# Patient Record
Sex: Male | Born: 1950 | Race: White | Hispanic: No | Marital: Married | Smoking: Former smoker
Health system: Southern US, Community
[De-identification: ages and names within clinical notes are randomized; demographics above are authoritative.]

## PROBLEM LIST (undated history)

## (undated) DIAGNOSIS — I10 Essential (primary) hypertension: Secondary | ICD-10-CM

## (undated) DIAGNOSIS — E785 Hyperlipidemia, unspecified: Secondary | ICD-10-CM

## (undated) DIAGNOSIS — N189 Chronic kidney disease, unspecified: Secondary | ICD-10-CM

## (undated) DIAGNOSIS — M1990 Unspecified osteoarthritis, unspecified site: Secondary | ICD-10-CM

## (undated) DIAGNOSIS — C099 Malignant neoplasm of tonsil, unspecified: Secondary | ICD-10-CM

## (undated) DIAGNOSIS — K8020 Calculus of gallbladder without cholecystitis without obstruction: Secondary | ICD-10-CM

## (undated) DIAGNOSIS — C9000 Multiple myeloma not having achieved remission: Secondary | ICD-10-CM

## (undated) HISTORY — DX: Essential (primary) hypertension: I10

## (undated) HISTORY — DX: Malignant neoplasm of tonsil, unspecified: C09.9

## (undated) HISTORY — DX: Chronic kidney disease, unspecified: N18.9

## (undated) HISTORY — DX: Hyperlipidemia, unspecified: E78.5

## (undated) HISTORY — DX: Calculus of gallbladder without cholecystitis without obstruction: K80.20

---
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
## 1997-12-28 HISTORY — PX: SHOULDER SURGERY: SHX246

## 1998-06-19 ENCOUNTER — Emergency Department (HOSPITAL_COMMUNITY): Admission: EM | Admit: 1998-06-19 | Discharge: 1998-06-19 | Payer: Self-pay | Admitting: Emergency Medicine

## 2003-04-16 HISTORY — PX: OTHER SURGICAL HISTORY: SHX169

## 2004-12-28 DIAGNOSIS — C099 Malignant neoplasm of tonsil, unspecified: Secondary | ICD-10-CM

## 2004-12-28 HISTORY — DX: Malignant neoplasm of tonsil, unspecified: C09.9

## 2008-12-29 ENCOUNTER — Emergency Department: Payer: Self-pay | Admitting: Emergency Medicine

## 2010-06-09 ENCOUNTER — Emergency Department: Payer: Self-pay | Admitting: Emergency Medicine

## 2010-06-22 ENCOUNTER — Emergency Department: Payer: Self-pay | Admitting: Emergency Medicine

## 2010-12-28 HISTORY — PX: KNEE SURGERY: SHX244

## 2013-06-26 ENCOUNTER — Other Ambulatory Visit: Payer: Self-pay | Admitting: Internal Medicine

## 2013-07-06 ENCOUNTER — Encounter: Payer: Self-pay | Admitting: Internal Medicine

## 2013-07-06 ENCOUNTER — Ambulatory Visit (INDEPENDENT_AMBULATORY_CARE_PROVIDER_SITE_OTHER): Admitting: Internal Medicine

## 2013-07-06 VITALS — BP 130/86 | HR 64 | Ht 69.0 in | Wt 217.4 lb

## 2013-07-06 DIAGNOSIS — N182 Chronic kidney disease, stage 2 (mild): Secondary | ICD-10-CM

## 2013-07-06 DIAGNOSIS — C099 Malignant neoplasm of tonsil, unspecified: Secondary | ICD-10-CM

## 2013-07-06 DIAGNOSIS — I499 Cardiac arrhythmia, unspecified: Secondary | ICD-10-CM

## 2013-07-06 DIAGNOSIS — E785 Hyperlipidemia, unspecified: Secondary | ICD-10-CM

## 2013-07-06 DIAGNOSIS — I10 Essential (primary) hypertension: Secondary | ICD-10-CM

## 2013-07-06 DIAGNOSIS — R0989 Other specified symptoms and signs involving the circulatory and respiratory systems: Secondary | ICD-10-CM

## 2013-07-06 NOTE — Patient Instructions (Addendum)
Your physician wants you to follow-up in: 1 year. You will receive a reminder letter in the mail two months in advance. If you don't receive a letter, please call our office to schedule the follow-up appointment.  

## 2013-07-08 ENCOUNTER — Encounter: Payer: Self-pay | Admitting: Internal Medicine

## 2013-07-08 DIAGNOSIS — C099 Malignant neoplasm of tonsil, unspecified: Secondary | ICD-10-CM | POA: Insufficient documentation

## 2013-07-08 DIAGNOSIS — I499 Cardiac arrhythmia, unspecified: Secondary | ICD-10-CM | POA: Insufficient documentation

## 2013-07-08 DIAGNOSIS — N182 Chronic kidney disease, stage 2 (mild): Secondary | ICD-10-CM | POA: Insufficient documentation

## 2013-07-08 DIAGNOSIS — E782 Mixed hyperlipidemia: Secondary | ICD-10-CM | POA: Insufficient documentation

## 2013-07-08 DIAGNOSIS — I10 Essential (primary) hypertension: Secondary | ICD-10-CM | POA: Insufficient documentation

## 2013-07-08 NOTE — Progress Notes (Signed)
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 He has been on Vytorin 10/40 mg. Recently, a lipid NMR demonstrated a high particle number at 1323 with an LDL cholesterol of 67, indicating increased risk. He also underwent a [place] heart protocol per your request, which showed an elevated lipoprotein(a) at 155 and elevated insulin level, suggesting early insulin resistance. Hemoglobin was 5.8. APOB levels were mildly elevated as well as APOA1 fractions. However, overall cholesterol control is fairly good. In the past, I had recommended changing him to Crestor; however, apparently he was intolerant to this in the past as well as Lipitor and another statin, which he does not recall. He is on low-dose Diovan for hypertension and had been on Norvasc in the past. The Diovan was apparently started by Dr. Jacinto Halim prior to my caring for him and I feel that this is a reasonable medicine for blood pressure control. Symptomatically, he denies any chest pain, shortness of breath, palpitations, presyncope, or syncopal symptoms.   PMHx:  Past Medical History  Diagnosis Date  . Hypertension   . Dyslipidemia   . Tonsillar cancer 2006  . Cholelithiases     Past Surgical History  Procedure Laterality Date  . Appendectomy  1962  . Shoulder surgery  1999    left  . Knee surgery  2012    right    FAMHx:  Family History  Problem Relation Age of Onset  . Cancer Mother   . Cancer Father   . Hypertension Sister     SOCHx:   reports that he quit smoking about 9 years ago. His smoking use included Cigars. He quit smokeless tobacco use about 18 years ago. His smokeless tobacco use included Chew. He reports that  drinks alcohol. He reports that he does  not use illicit drugs.  ALLERGIES:  Allergies  Allergen Reactions  . Asa (Aspirin) Nausea Only  . Hydrocodone Itching and Other (See Comments)    Jittery, hyperactivity   . Amoxicillin Itching and Rash  . Indapamide Palpitations    Lightheadedness, dizziness    ROS: A comprehensive review of systems was negative.  HOME MEDS: Current Outpatient Prescriptions  Medication Sig Dispense Refill  . Bisacodyl (DULCOLAX PO) Take 3 capsules by mouth daily.      [name] diltiazem 2 % GEL Apply 1 application topically 3 (three) times daily as needed.      [name] DIOVAN 80 MG tablet TAKE 1 TABLET DAILY  90 tablet  0  . ezetimibe-simvastatin (VYTORIN) 10-40 MG per tablet Take 1 tablet by mouth at bedtime.      . hydrocortisone (PROCTOZONE-HC) 2.5 % rectal cream Place 1 application rectally 3 (three) times daily as needed for hemorrhoids.      . Lidocaine, Anorectal, (RECTICARE) 5 % CREA Apply 1 application topically as needed.      . Multiple Vitamins-Minerals (CENTRUM SILVER ULTRA MENS PO) Take 1 tablet by mouth daily.      . naproxen sodium (ALEVE) 220 MG tablet Take 220 mg by mouth 2 (two) times daily with a meal.      . oxyCODONE-acetaminophen (PERCOCET/ROXICET) 5-325 MG per tablet Take 1 tablet by mouth every 4 (four) hours as needed for pain.      . promethazine (  PHENERGAN) 12.5 MG tablet Take 12.5 mg by mouth every 6 (six) hours as needed for nausea.      . ranitidine (ZANTAC) 150 MG tablet Take 150 mg by mouth daily.       No current facility-administered medications for this visit.    LABS/IMAGING: No results found for this or any previous visit (from the past 48 hour(s)). No results found.  VITALS: BP 130/86  Pulse 64  Ht 5\' 9"  (1.753 m)  Wt 217 lb 6.4 oz (98.612 kg)  BMI 32.09 kg/m2  EXAM: General appearance: alert and no distress Neck: no adenopathy, no carotid bruit, no JVD, supple, symmetrical, trachea midline and thyroid not enlarged, symmetric, no tenderness/mass/nodules Lungs:  clear to auscultation bilaterally Heart: regular rate and rhythm, S1, S2 normal, no murmur Abdomen: soft, non-tender; bowel sounds normal; no masses,  no organomegaly Extremities: extremities normal, atraumatic, no cyanosis or edema Pulses: 2+ and symmetric Skin: Skin color, texture, turgor normal. No rashes or lesions Neurologic: Grossly normal  EKG: Normal sinus rhythm  ASSESSMENT: 1. Hypertension 2. Dyslipidemia 3. History of tonsillar cancer 4. Mild chronic kidney disease  PLAN: 1.   Mr. Heider is doing well from a cardiovascular standpoint. His blood pressure is well controlled and he is due for repeat lipid profile next week. I will continue his current medications and we can see him back annually.  Chrystie Nose, MD, [facility] Attending Cardiologist [facility]  Chrishawn Boley,[place] C 07/08/2013, 9:30 AM

## 2013-07-11 ENCOUNTER — Encounter: Payer: Self-pay | Admitting: Internal Medicine

## 2013-08-18 ENCOUNTER — Ambulatory Visit (INDEPENDENT_AMBULATORY_CARE_PROVIDER_SITE_OTHER): Admitting: General Surgery

## 2013-11-02 ENCOUNTER — Other Ambulatory Visit: Payer: Self-pay

## 2014-03-16 ENCOUNTER — Other Ambulatory Visit (HOSPITAL_COMMUNITY): Payer: Self-pay | Admitting: Internal Medicine

## 2014-03-16 DIAGNOSIS — R748 Abnormal levels of other serum enzymes: Secondary | ICD-10-CM

## 2014-03-27 ENCOUNTER — Encounter (HOSPITAL_COMMUNITY)
Admission: RE | Admit: 2014-03-27 | Discharge: 2014-03-27 | Disposition: A | Discharge status: Home / Self Care | Source: Ambulatory Visit | Attending: Internal Medicine | Admitting: Internal Medicine

## 2014-03-27 DIAGNOSIS — C760 Malignant neoplasm of head, face and neck: Secondary | ICD-10-CM | POA: Insufficient documentation

## 2014-03-27 DIAGNOSIS — R748 Abnormal levels of other serum enzymes: Secondary | ICD-10-CM

## 2014-03-27 MED ORDER — TECHNETIUM TC 99M MEDRONATE IV KIT
24.8000 | PACK | Freq: Once | INTRAVENOUS | Status: AC | PRN
  Administered 2014-03-27: 24.8 via INTRAVENOUS

## 2014-09-07 DIAGNOSIS — I10 Essential (primary) hypertension: Secondary | ICD-10-CM | POA: Diagnosis not present

## 2014-09-07 DIAGNOSIS — E782 Mixed hyperlipidemia: Secondary | ICD-10-CM | POA: Diagnosis not present

## 2014-09-14 DIAGNOSIS — R39198 Other difficulties with micturition: Secondary | ICD-10-CM | POA: Diagnosis not present

## 2014-09-14 DIAGNOSIS — N183 Chronic kidney disease, stage 3 unspecified: Secondary | ICD-10-CM | POA: Diagnosis not present

## 2014-09-14 DIAGNOSIS — M549 Dorsalgia, unspecified: Secondary | ICD-10-CM | POA: Diagnosis not present

## 2014-09-14 DIAGNOSIS — Z23 Encounter for immunization: Secondary | ICD-10-CM | POA: Diagnosis not present

## 2014-09-14 DIAGNOSIS — E782 Mixed hyperlipidemia: Secondary | ICD-10-CM | POA: Diagnosis not present

## 2014-09-14 DIAGNOSIS — M25559 Pain in unspecified hip: Secondary | ICD-10-CM | POA: Diagnosis not present

## 2014-09-14 DIAGNOSIS — I10 Essential (primary) hypertension: Secondary | ICD-10-CM | POA: Diagnosis not present

## 2014-09-27 DIAGNOSIS — R0602 Shortness of breath: Secondary | ICD-10-CM | POA: Diagnosis not present

## 2014-09-27 DIAGNOSIS — R072 Precordial pain: Secondary | ICD-10-CM | POA: Diagnosis not present

## 2014-09-28 ENCOUNTER — Other Ambulatory Visit (HOSPITAL_COMMUNITY): Payer: Self-pay | Admitting: Internal Medicine

## 2014-09-28 DIAGNOSIS — R072 Precordial pain: Secondary | ICD-10-CM

## 2014-10-11 ENCOUNTER — Encounter (HOSPITAL_COMMUNITY)

## 2014-10-11 ENCOUNTER — Telehealth (HOSPITAL_COMMUNITY): Payer: Self-pay

## 2014-10-11 NOTE — Telephone Encounter (Signed)
Encounter complete. 

## 2014-10-12 ENCOUNTER — Telehealth (HOSPITAL_COMMUNITY): Payer: Self-pay

## 2014-10-12 DIAGNOSIS — E782 Mixed hyperlipidemia: Secondary | ICD-10-CM | POA: Diagnosis not present

## 2014-10-12 DIAGNOSIS — M25559 Pain in unspecified hip: Secondary | ICD-10-CM | POA: Diagnosis not present

## 2014-10-12 NOTE — Telephone Encounter (Signed)
Encounter complete. 

## 2014-10-16 ENCOUNTER — Ambulatory Visit (HOSPITAL_COMMUNITY)
Admission: RE | Admit: 2014-10-16 | Discharge: 2014-10-16 | Disposition: A | Discharge status: Home / Self Care | Payer: Medicare Other | Source: Ambulatory Visit | Attending: Cardiovascular Disease | Admitting: Cardiovascular Disease

## 2014-10-16 DIAGNOSIS — R079 Chest pain, unspecified: Secondary | ICD-10-CM | POA: Diagnosis not present

## 2014-10-16 DIAGNOSIS — R072 Precordial pain: Secondary | ICD-10-CM

## 2014-10-16 NOTE — Procedures (Signed)
Exercise Treadmill Test   Test  Exercise Tolerance Test Ordering MD:  [name], MD    Unique Test No: 1  Treadmill:  1  Indication for ETT: chest pain - rule out ischemia  Contraindication to ETT: No   Stress Modality: exercise - treadmill  Cardiac Imaging Performed: non   Protocol: standard Bruce - maximal  Max BP:  150/69  Max MPHR (bpm):  155 85% MPR (bpm):  132  MPHR obtained (bpm):  136 % MPHR obtained:  87  Reached 85% MPHR (min:sec):  6: 30 Total Exercise Time:  7  Workload in METS:  8.5 Borg Scale: 15  Reason ETT Terminated:  DOE and Left Hip Pain    ST Segment Analysis At Rest: NSR with nonspecific ST changes. With Exercise: no evidence of significant ST depression  Other Information Arrhythmia:  Rare PVC. Angina during ETT:  absent (0) Quality of ETT:  diagnostic  ETT Interpretation:  normal - no evidence of ischemia by ST analysis  Comments: ETT with fair exercise tolerance; normal BP response; no chest pain; no ST changes; negative adequate ETT.  Kirk Ruths

## 2014-10-18 DIAGNOSIS — E782 Mixed hyperlipidemia: Secondary | ICD-10-CM | POA: Diagnosis not present

## 2014-10-18 DIAGNOSIS — R072 Precordial pain: Secondary | ICD-10-CM | POA: Diagnosis not present

## 2014-10-18 DIAGNOSIS — I10 Essential (primary) hypertension: Secondary | ICD-10-CM | POA: Diagnosis not present

## 2014-10-18 DIAGNOSIS — R0602 Shortness of breath: Secondary | ICD-10-CM | POA: Diagnosis not present

## 2015-01-31 DIAGNOSIS — I10 Essential (primary) hypertension: Secondary | ICD-10-CM | POA: Diagnosis not present

## 2015-01-31 DIAGNOSIS — E782 Mixed hyperlipidemia: Secondary | ICD-10-CM | POA: Diagnosis not present

## 2015-01-31 DIAGNOSIS — N183 Chronic kidney disease, stage 3 (moderate): Secondary | ICD-10-CM | POA: Diagnosis not present

## 2015-01-31 DIAGNOSIS — R81 Glycosuria: Secondary | ICD-10-CM | POA: Diagnosis not present

## 2015-01-31 DIAGNOSIS — R809 Proteinuria, unspecified: Secondary | ICD-10-CM | POA: Diagnosis not present

## 2015-01-31 DIAGNOSIS — E781 Pure hyperglyceridemia: Secondary | ICD-10-CM | POA: Diagnosis not present

## 2015-02-07 DIAGNOSIS — R809 Proteinuria, unspecified: Secondary | ICD-10-CM | POA: Diagnosis not present

## 2015-02-07 DIAGNOSIS — I10 Essential (primary) hypertension: Secondary | ICD-10-CM | POA: Diagnosis not present

## 2015-02-07 DIAGNOSIS — E782 Mixed hyperlipidemia: Secondary | ICD-10-CM | POA: Diagnosis not present

## 2015-02-07 DIAGNOSIS — N183 Chronic kidney disease, stage 3 (moderate): Secondary | ICD-10-CM | POA: Diagnosis not present

## 2015-02-15 DIAGNOSIS — N200 Calculus of kidney: Secondary | ICD-10-CM | POA: Diagnosis not present

## 2015-02-19 ENCOUNTER — Telehealth: Payer: Self-pay | Admitting: Hematology

## 2015-02-19 NOTE — Telephone Encounter (Signed)
pt confirmed appt for 03/15/15 at 2:30pm w/ [facility] Dx: abnormal protien in blood and M spike Referring Dr. Christie Beckers

## 2015-02-21 DIAGNOSIS — M8580 Other specified disorders of bone density and structure, unspecified site: Secondary | ICD-10-CM | POA: Diagnosis not present

## 2015-02-27 DIAGNOSIS — I10 Essential (primary) hypertension: Secondary | ICD-10-CM | POA: Diagnosis not present

## 2015-02-27 DIAGNOSIS — N183 Chronic kidney disease, stage 3 (moderate): Secondary | ICD-10-CM | POA: Diagnosis not present

## 2015-02-27 DIAGNOSIS — E8809 Other disorders of plasma-protein metabolism, not elsewhere classified: Secondary | ICD-10-CM | POA: Diagnosis not present

## 2015-03-15 ENCOUNTER — Encounter: Payer: Self-pay | Admitting: Hematology

## 2015-03-15 ENCOUNTER — Telehealth: Payer: Self-pay | Admitting: Hematology

## 2015-03-15 ENCOUNTER — Ambulatory Visit (HOSPITAL_BASED_OUTPATIENT_CLINIC_OR_DEPARTMENT_OTHER): Payer: Medicare Other

## 2015-03-15 ENCOUNTER — Ambulatory Visit (HOSPITAL_BASED_OUTPATIENT_CLINIC_OR_DEPARTMENT_OTHER): Payer: Medicare Other | Admitting: Hematology

## 2015-03-15 ENCOUNTER — Ambulatory Visit: Payer: Medicare Other

## 2015-03-15 VITALS — BP 145/70 | HR 61 | Temp 97.3°F | Resp 18 | Ht 69.0 in | Wt 206.7 lb

## 2015-03-15 DIAGNOSIS — Z8589 Personal history of malignant neoplasm of other organs and systems: Secondary | ICD-10-CM | POA: Diagnosis not present

## 2015-03-15 DIAGNOSIS — M8580 Other specified disorders of bone density and structure, unspecified site: Secondary | ICD-10-CM

## 2015-03-15 DIAGNOSIS — D472 Monoclonal gammopathy: Secondary | ICD-10-CM | POA: Diagnosis not present

## 2015-03-15 LAB — CBC & DIFF AND RETIC
BASO%: 0.2 % (ref 0.0–2.0)
BASOS ABS: 0 10*3/uL (ref 0.0–0.1)
EOS%: 0.7 % (ref 0.0–7.0)
Eosinophils Absolute: 0 10*3/uL (ref 0.0–0.5)
HCT: 41.7 % (ref 38.4–49.9)
HEMOGLOBIN: 14.6 g/dL (ref 13.0–17.1)
Immature Retic Fract: 6.9 % (ref 3.00–10.60)
LYMPH%: 24 % (ref 14.0–49.0)
MCH: 33.8 pg — AB (ref 27.2–33.4)
MCHC: 35 g/dL (ref 32.0–36.0)
MCV: 96.5 fL (ref 79.3–98.0)
MONO#: 0.7 10*3/uL (ref 0.1–0.9)
MONO%: 11 % (ref 0.0–14.0)
NEUT#: 3.8 10*3/uL (ref 1.5–6.5)
NEUT%: 64.1 % (ref 39.0–75.0)
PLATELETS: 264 10*3/uL (ref 140–400)
RBC: 4.32 10*6/uL (ref 4.20–5.82)
RDW: 13.4 % (ref 11.0–14.6)
RETIC %: 1.54 % (ref 0.80–1.80)
Retic Ct Abs: 66.53 10*3/uL (ref 34.80–93.90)
WBC: 5.9 10*3/uL (ref 4.0–10.3)
lymph#: 1.4 10*3/uL (ref 0.9–3.3)

## 2015-03-15 LAB — COMPREHENSIVE METABOLIC PANEL (CC13)
ALT: 43 U/L (ref 0–55)
ANION GAP: 7 meq/L (ref 3–11)
AST: 31 U/L (ref 5–34)
Albumin: 4.4 g/dL (ref 3.5–5.0)
Alkaline Phosphatase: 196 U/L — ABNORMAL HIGH (ref 40–150)
BUN: 8.5 mg/dL (ref 7.0–26.0)
CALCIUM: 9.5 mg/dL (ref 8.4–10.4)
CHLORIDE: 109 meq/L (ref 98–109)
CO2: 19 meq/L — AB (ref 22–29)
Creatinine: 1.5 mg/dL — ABNORMAL HIGH (ref 0.7–1.3)
EGFR: 50 mL/min/{1.73_m2} — AB (ref >90)
GLUCOSE: 96 mg/dL (ref 70–140)
Potassium: 3.4 mEq/L — ABNORMAL LOW (ref 3.5–5.1)
SODIUM: 135 meq/L — AB (ref 136–145)
TOTAL PROTEIN: 8.7 g/dL — AB (ref 6.4–8.3)
Total Bilirubin: 0.5 mg/dL (ref 0.20–1.20)

## 2015-03-15 LAB — LACTATE DEHYDROGENASE (CC13): LDH: 164 U/L (ref 125–245)

## 2015-03-15 NOTE — Telephone Encounter (Signed)
Gave avs & calendar for APril. Sent for labs

## 2015-03-15 NOTE — Progress Notes (Signed)
Checked in new pt with no financial concerns. °

## 2015-03-15 NOTE — Addendum Note (Signed)
Addended by: Truitt Merle on: 03/15/2015 04:00 PM   Modules accepted: Orders, SmartSet

## 2015-03-15 NOTE — Progress Notes (Signed)
[place]  [phone] [phone]) [place] Note   Patient Care Team: [name], MD as PCP - General (Internal Medicine) 03/15/2015  CHIEF COMPLAINTS/PURPOSE OF CONSULTATION: Back pain, abnormal SPEP  Referring physician: [name], MD  HISTORY OF PRESENTING ILLNESS:  Eric Dudley 64 y.o. 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  He has been having low back pain for 3-4 months. He had food posinin 4 month ago, and had frequent diarrhea. He started noticed low back pain since then. The pain is not radiating to leg, he also has intermittent right rib pain, worse with cough and sneezing. He remains to be physically active, able to do all the activities, such as gardening work housework without much limitation, but he doesn't sings slowly because of back pain. He takes Tylenol as needed, does not like the necrotic pain medication. No night sweats, no fever or chillss, no weight loss.   He was seen by his primary care physician Dr. [name], MD and had lab test (see below). He also had bone density scan 2/25 which showed osteoporosis, has not been treated yet.  MEDICAL HISTORY:  Past Medical History  Diagnosis Date  . Hypertension   . Dyslipidemia   . Tonsillar cancer 2006  . Cholelithiases     SURGICAL HISTORY: Past Surgical History  Procedure Laterality Date  . Appendectomy  1962  . Shoulder surgery  1999    left  . Knee surgery  2012    right    SOCIAL HISTORY: History   Social History  . Marital Status: Married    Spouse Name: N/A  . Number of Children: 1   . Years of Education: N/A   Occupational History  . A retired Therapist, occupational    Social History Main Topics  . Smoking status: Former Smoker    Types: Cigars    Quit date: 12/29/2003  . Smokeless tobacco:  Former Systems developer    Types: Chew    Quit date: 12/28/1994  . Alcohol Use: Yes     Comment: occasional beer  . Drug Use: No  . Sexual Activity: Not on file   Other Topics Concern  . Not on file   Social History Narrative  . No narrative on file    FAMILY HISTORY: Family History  Problem Relation Age of Onset  . Cancer Mother   . Cancer Father   . Hypertension Sister     ALLERGIES:  is allergic to asa; hydrocodone; amoxicillin; and indapamide.  MEDICATIONS:  Current Outpatient Prescriptions  Medication Sig Dispense Refill  . acetaminophen (TYLENOL) 325 MG tablet Take 650 mg by mouth every 6 (six) hours as needed.    . Bisacodyl (DULCOLAX PO) Take 3 capsules by mouth daily.    [name] DIOVAN 80 MG tablet TAKE 1 TABLET DAILY 90 tablet 0  . ezetimibe-simvastatin (VYTORIN) 10-40 MG per tablet Take 1 tablet by mouth at bedtime.    . Multiple Vitamins-Minerals (CENTRUM SILVER ULTRA MENS PO) Take 1 tablet by mouth daily.    . psyllium (METAMUCIL) 58.6 % powder Take 1 packet by mouth every other day.    . ranitidine (ZANTAC) 150 MG tablet Take 150 mg by mouth daily.    [name] diltiazem 2 % GEL Apply 1 application topically 3 (three) times daily as needed.    . hydrocortisone (PROCTOZONE-HC) 2.5 % rectal  cream Place 1 application rectally 3 (three) times daily as needed for hemorrhoids.    [name] oxyCODONE-acetaminophen (PERCOCET/ROXICET) 5-325 MG per tablet Take 1 tablet by mouth every 4 (four) hours as needed for pain.    . promethazine (PHENERGAN) 12.5 MG tablet Take 12.5 mg by mouth every 6 (six) hours as needed for nausea.     No current facility-administered medications for this visit.    REVIEW OF SYSTEMS:   Constitutional: Denies fevers, chills or abnormal night sweats Eyes: Denies blurriness of vision, double vision or watery eyes Ears, nose, mouth, throat, and face: Denies mucositis or sore throat Respiratory: Denies cough, dyspnea or wheezes Cardiovascular: Denies palpitation, chest  discomfort or lower extremity swelling Gastrointestinal:  Denies nausea, heartburn or change in bowel habits Skin: Denies abnormal skin rashes Lymphatics: Denies new lymphadenopathy or easy bruising Neurological:Denies numbness, tingling or new weaknesses Behavioral/Psych: Mood is stable, no new changes  Musculoskeletal: Positive for back pain and intermittent rib pain All other systems were reviewed with the patient and are negative.  PHYSICAL EXAMINATION: ECOG PERFORMANCE STATUS: 1 - Symptomatic but completely ambulatory  Filed Vitals:   03/15/15 1412  BP: 145/70  Pulse: 61  Temp: 97.3 F (36.3 C)  Resp: 18   Filed Weights   03/15/15 1412  Weight: 206 lb 11.2 oz (93.759 kg)    GENERAL:alert, no distress and comfortable SKIN: skin color, texture, turgor are normal, no rashes or significant lesions EYES: normal, conjunctiva are pink and non-injected, sclera clear OROPHARYNX:no exudate, no erythema and lips, buccal mucosa, and tongue normal  NECK: supple, thyroid normal size, non-tender, without nodularity LYMPH:  no palpable lymphadenopathy in the cervical, axillary or inguinal LUNGS: clear to auscultation and percussion with normal breathing effort HEART: regular rate & rhythm and no murmurs and no lower extremity edema ABDOMEN:abdomen soft, non-tender and normal bowel sounds Musculoskeletal:no cyanosis of digits and no clubbing  PSYCH: alert & oriented x 3 with fluent speech NEURO: no focal motor/sensory deficits  LABORATORY DATA:  I have reviewed the data as listed  RADIOGRAPHIC STUDIES: I have personally reviewed the radiological images as listed and agreed with the findings in the report. Lab from 01/31/2015 CMP: Albumin 5.1, alkaline phosphate 161, ALT 41, AST 28, bilirubin 0.4, BUN 8, calcium 9.7, creatinine 1.47, total protein 8.6, sodium 138, potassium 3.9. HbA1c: 5.9 Protein electrophoresis: Total protein 8.2,, globulin 1.9, M spike 1.7G/DL Random urine  protein electrophoresis: Protein 230MG/DL, M spike 52% CBC on 09/07/2014: WBC 5.9: Hemoglobin 14.8, hematocrit 42.9%, MCV 94.5, platelet count 301k   ASSESSMENT & PLAN:  64 year old Caucasian male with past medical history of hypertension, tonsil cancer status post surgery and radiation 10 years ago, now presented with 3-4 months low back pain and intermittent rib pain. His blood test showed M protein 1.7g/dl.  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 -I'll check his CBC with differential, repeat SPEP with immunofixation and quantitative immunoglobin, light chain levels, 24-hour urine protein electrophoresis with immunofixation and light chain level. -Bone survey -I discussed a bone marrow biopsy to ruled out multiple myeloma. The benefit and risks was Explained to patient he agrees to proceed. I'll set up through interventional radiology. -I briefly discussed the treatment options for multiple myeloma, which include chemotherapy and bone marrow transplant.   2. Osteopenia -We'll likely give him a biphosphonate after the above  on diagnose workup..   3. Hypertension -Follow up with primary care physician.  Follow-up: I'll see him back in 3 weeks to discuss the above results it  All questions were answered. The patient knows to call the clinic with any problems, questions or concerns. I spent 45 minutes counseling the patient face to face. The total time spent in the appointment was 60 minutes and more than 50% was on counseling.     Truitt Merle, MD 03/15/2015 2:56 PM

## 2015-03-18 DIAGNOSIS — D472 Monoclonal gammopathy: Secondary | ICD-10-CM | POA: Diagnosis not present

## 2015-03-19 ENCOUNTER — Ambulatory Visit (HOSPITAL_COMMUNITY)
Admission: RE | Admit: 2015-03-19 | Discharge: 2015-03-19 | Disposition: A | Discharge status: Home / Self Care | Payer: Medicare Other | Source: Ambulatory Visit | Attending: Hematology | Admitting: Hematology

## 2015-03-19 DIAGNOSIS — D472 Monoclonal gammopathy: Secondary | ICD-10-CM | POA: Diagnosis present

## 2015-03-19 LAB — SPEP & IFE WITH QIG
ALPHA-1-GLOBULIN: 0.2 g/dL (ref 0.2–0.3)
Albumin ELP: 4.8 g/dL (ref 3.8–4.8)
Alpha-2-Globulin: 1 g/dL — ABNORMAL HIGH (ref 0.5–0.9)
BETA 2: 0.1 g/dL — AB (ref 0.2–0.5)
Beta Globulin: 0.5 g/dL (ref 0.4–0.6)
Gamma Globulin: 1.9 g/dL — ABNORMAL HIGH (ref 0.8–1.7)
IGA: 12 mg/dL — AB (ref 68–379)
IGG (IMMUNOGLOBIN G), SERUM: 2000 mg/dL — AB (ref 650–1600)
IgM, Serum: 5 mg/dL — ABNORMAL LOW (ref 41–251)
M-Spike, %: 1.6 g/dL
Total Protein, Serum Electrophoresis: 8.5 g/dL — ABNORMAL HIGH (ref 6.1–8.1)

## 2015-03-19 LAB — KAPPA/LAMBDA LIGHT CHAINS
Kappa free light chain: 690 mg/dL — ABNORMAL HIGH (ref 0.33–1.94)
Lambda Free Lght Chn: 0.13 mg/dL — ABNORMAL LOW (ref 0.57–2.63)

## 2015-03-19 LAB — BETA 2 MICROGLOBULIN, SERUM: Beta-2 Microglobulin: 2.68 mg/L — ABNORMAL HIGH (ref <=2.51)

## 2015-03-25 ENCOUNTER — Other Ambulatory Visit: Payer: Self-pay | Admitting: Radiology

## 2015-03-25 LAB — UIFE/LIGHT CHAINS/TP QN, 24-HR UR
ALPHA 1 UR: DETECTED — AB
Albumin, U: DETECTED
Alpha 2, Urine: DETECTED — AB
Beta, Urine: DETECTED — AB
Gamma Globulin, Urine: DETECTED — AB
TIME-UPE24: 24 h
TOTAL PROTEIN, URINE-UPE24: 146 mg/dL — AB (ref 5–25)
Total Protein, Urine-Ur/day: 5402 mg/d — ABNORMAL HIGH (ref <150)
Volume, Urine: 3700 mL

## 2015-03-25 LAB — UPEP/TP, 24-HR URINE
Albumin: 11.2 %
Alpha-1-Globulin, U: 15.7 %
Alpha-2-Globulin, U: 8.5 %
Beta Globulin, U: 10 %
COLLECTION INTERVAL: 24 h
Gamma Globulin, U: 54.6 %
Monoclonal Band 1: 4.3 %
Monoclonal Band 2: 7.5 %
TOTAL PROTEIN, URINE: 146 mg/dL
Total Protein, Urine/Day: 5402 mg/d — ABNORMAL HIGH (ref 50–100)
Total Volume, Urine: 3700 mL

## 2015-03-25 LAB — 24 HR URINE,KAPPA/LAMBDA LIGHT CHAINS
24H Urine Volume: 3700 mL/24 h
MEASURED LAMBDA CHAIN: 1.02 mg/dL (ref <2.00)
Measured Kappa Chain: 65.8 mg/dL — ABNORMAL HIGH (ref <2.00)
TOTAL KAPPA CHAIN: 2434.6 mg/(24.h)
Total Lambda Chain: 37.74 mg/24 h

## 2015-03-26 ENCOUNTER — Other Ambulatory Visit: Payer: Self-pay | Admitting: Radiology

## 2015-03-27 ENCOUNTER — Encounter (HOSPITAL_COMMUNITY): Payer: Self-pay

## 2015-03-27 ENCOUNTER — Ambulatory Visit (HOSPITAL_COMMUNITY)
Admission: RE | Admit: 2015-03-27 | Discharge: 2015-03-27 | Disposition: A | Discharge status: Home / Self Care | Payer: Medicare Other | Source: Ambulatory Visit | Attending: Interventional Radiology | Admitting: Interventional Radiology

## 2015-03-27 ENCOUNTER — Ambulatory Visit (HOSPITAL_COMMUNITY)
Admission: RE | Admit: 2015-03-27 | Discharge: 2015-03-27 | Disposition: A | Discharge status: Home / Self Care | Payer: Medicare Other | Source: Ambulatory Visit | Attending: Hematology | Admitting: Hematology

## 2015-03-27 DIAGNOSIS — Z9049 Acquired absence of other specified parts of digestive tract: Secondary | ICD-10-CM | POA: Insufficient documentation

## 2015-03-27 DIAGNOSIS — Z79899 Other long term (current) drug therapy: Secondary | ICD-10-CM | POA: Insufficient documentation

## 2015-03-27 DIAGNOSIS — D4989 Neoplasm of unspecified behavior of other specified sites: Secondary | ICD-10-CM | POA: Diagnosis not present

## 2015-03-27 DIAGNOSIS — I10 Essential (primary) hypertension: Secondary | ICD-10-CM | POA: Insufficient documentation

## 2015-03-27 DIAGNOSIS — Z87891 Personal history of nicotine dependence: Secondary | ICD-10-CM | POA: Insufficient documentation

## 2015-03-27 DIAGNOSIS — Z8589 Personal history of malignant neoplasm of other organs and systems: Secondary | ICD-10-CM | POA: Insufficient documentation

## 2015-03-27 DIAGNOSIS — D472 Monoclonal gammopathy: Secondary | ICD-10-CM

## 2015-03-27 DIAGNOSIS — C9030 Solitary plasmacytoma not having achieved remission: Secondary | ICD-10-CM | POA: Insufficient documentation

## 2015-03-27 DIAGNOSIS — E785 Hyperlipidemia, unspecified: Secondary | ICD-10-CM | POA: Diagnosis not present

## 2015-03-27 LAB — PROTIME-INR
INR: 0.98 (ref 0.00–1.49)
Prothrombin Time: 13.1 seconds (ref 11.6–15.2)

## 2015-03-27 LAB — CBC
HEMATOCRIT: 43.1 % (ref 39.0–52.0)
Hemoglobin: 14.8 g/dL (ref 13.0–17.0)
MCH: 33.6 pg (ref 26.0–34.0)
MCHC: 34.3 g/dL (ref 30.0–36.0)
MCV: 98 fL (ref 78.0–100.0)
Platelets: 263 10*3/uL (ref 150–400)
RBC: 4.4 MIL/uL (ref 4.22–5.81)
RDW: 13.3 % (ref 11.5–15.5)
WBC: 5.3 10*3/uL (ref 4.0–10.5)

## 2015-03-27 LAB — BONE MARROW EXAM

## 2015-03-27 MED ORDER — MIDAZOLAM HCL 2 MG/2ML IJ SOLN
INTRAMUSCULAR | Status: AC
  Filled 2015-03-27: qty 6

## 2015-03-27 MED ORDER — FENTANYL CITRATE 0.05 MG/ML IJ SOLN
INTRAMUSCULAR | Status: AC | PRN
  Administered 2015-03-27 (×4): 25 ug via INTRAVENOUS

## 2015-03-27 MED ORDER — SODIUM CHLORIDE 0.9 % IV SOLN
INTRAVENOUS | Status: DC
  Administered 2015-03-27: 07:00:00 via INTRAVENOUS

## 2015-03-27 MED ORDER — FENTANYL CITRATE 0.05 MG/ML IJ SOLN
INTRAMUSCULAR | Status: AC
  Filled 2015-03-27: qty 4

## 2015-03-27 MED ORDER — MIDAZOLAM HCL 2 MG/2ML IJ SOLN
INTRAMUSCULAR | Status: AC | PRN
Start: 2015-03-27 — End: 2015-03-27
  Administered 2015-03-27: 1 mg via INTRAVENOUS
  Administered 2015-03-27 (×2): 0.5 mg via INTRAVENOUS

## 2015-03-27 NOTE — Procedures (Signed)
Successful RT ILIAC BM ASP AND ATTEMPTED CORE BX NO COMP STABLE PATH PENDING FULL REPORT IN PACS

## 2015-03-27 NOTE — Discharge Instructions (Signed)
Leave dressing on for 24 hours.  You may shower after 24 hours.  Please remove the dressing before you shower.   ° °Bone Marrow Aspiration, Bone Marrow Biopsy °Care After °Read the instructions outlined below and refer to this sheet in the next few weeks. These discharge instructions provide you with general information on caring for yourself after you leave the hospital. Your caregiver may also give you specific instructions. While your treatment has been planned according to the most current medical practices available, unavoidable complications occasionally occur. If you have any problems or questions after discharge, call your caregiver. °FINDING OUT THE RESULTS OF YOUR TEST °Not all test results are available during your visit. If your test results are not back during the visit, make an appointment with your caregiver to find out the results. Do not assume everything is normal if you have not heard from your caregiver or the medical facility. It is important for you to follow up on all of your test results.  °HOME CARE INSTRUCTIONS  °You have had sedation and may be sleepy or dizzy. Your thinking may not be as clear as usual. For the next 24 hours: °· Only take over-the-counter or prescription medicines for pain, discomfort, and or fever as directed by your caregiver. °· Do not drink alcohol. °· Do not smoke. °· Do not drive. °· Do not make important legal decisions. °· Do not operate heavy machinery. °· Do not care for small children by yourself. °· Keep your dressing clean and dry. You may replace dressing with a bandage after 24 hours. °· You may take a bath or shower after 24 hours. °· Use an ice pack for 20 minutes every 2 hours while awake for pain as needed. °SEEK MEDICAL CARE IF:  °· There is redness, swelling, or increasing pain at the biopsy site. °· There is pus coming from the biopsy site. °· There is drainage from a biopsy site lasting longer than one day. °· An unexplained oral temperature above  102° F (38.9° C) develops. °SEEK IMMEDIATE MEDICAL CARE IF:  °· You develop a rash. °· You have difficulty breathing. °· You develop any reaction or side effects to medications given. °Document Released: 07/03/2005 Document Revised: 03/07/2012 Document Reviewed: 12/11/2008 °ExitCare® Patient Information ©2015 [employer]. This information is not intended to replace advice given to you by your health care provider. Make sure you discuss any questions you have with your health care provider. °Conscious Sedation, Adult, Care After °Refer to this sheet in the next few weeks. These instructions provide you with information on caring for yourself after your procedure. Your health care provider may also give you more specific instructions. Your treatment has been planned according to current medical practices, but problems sometimes occur. Call your health care provider if you have any problems or questions after your procedure. °WHAT TO EXPECT AFTER THE PROCEDURE  °After your procedure: °· You may feel sleepy, clumsy, and have poor balance for several hours. °· Vomiting may occur if you eat too soon after the procedure. °HOME CARE INSTRUCTIONS °· Do not participate in any activities where you could become injured for at least 24 hours. Do not: °· Drive. °· Swim. °· Ride a bicycle. °· Operate heavy machinery. °· Cook. °· Use power tools. °· Climb ladders. °· Work from a high place. °· Do not make important decisions or sign legal documents until you are improved. °· If you vomit, drink water, juice, or soup when you can drink without vomiting.   Make sure you have little or no nausea before eating solid foods. °· Only take over-the-counter or prescription medicines for pain, discomfort, or fever as directed by your health care provider. °· Make sure you and your family fully understand everything about the medicines given to you, including what side effects may occur. °· You should not drink alcohol, take sleeping pills,  or take medicines that cause drowsiness for at least 24 hours. °· If you smoke, do not smoke without supervision. °· If you are feeling better, you may resume normal activities 24 hours after you were sedated. °· Keep all appointments with your health care provider. °SEEK MEDICAL CARE IF: °· Your skin is pale or bluish in color. °· You continue to feel nauseous or vomit. °· Your pain is getting worse and is not helped by medicine. °· You have bleeding or swelling. °· You are still sleepy or feeling clumsy after 24 hours. °SEEK IMMEDIATE MEDICAL CARE IF: °· You develop a rash. °· You have difficulty breathing. °· You develop any type of allergic problem. °· You have a fever. °MAKE SURE YOU: °· Understand these instructions. °· Will watch your condition. °· Will get help right away if you are not doing well or get worse. °Document Released: 10/04/2013 Document Reviewed: 10/04/2013 °ExitCare® Patient Information ©2015 [employer]. This information is not intended to replace advice given to you by your health care provider. Make sure you discuss any questions you have with your health care provider. ° ° °

## 2015-03-27 NOTE — H&P (Signed)
Chief Complaint: "I'm here for a bone marrow biopsy"  HPI: Eric Dudley is an 64 y.o. 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 He is referred for bone marrow biopsy. Has bee NPO this am   Past Medical History:  Past Medical History  Diagnosis Date  . Hypertension   . Dyslipidemia   . Tonsillar cancer 2006  . Cholelithiases     Past Surgical History:  Past Surgical History  Procedure Laterality Date  . Appendectomy  1962  . Shoulder surgery  1999    left  . Knee surgery  2012    right    Family History:  Family History  Problem Relation Age of Onset  . Cancer Mother 10    cervical cancer   . Cancer Father     unknown cancer   . Hypertension Sister   . Cancer Sister     melanoma     Social History:  reports that he quit smoking about 11 years ago. His smoking use included Cigars. He quit smokeless tobacco use about 20 years ago. His smokeless tobacco use included Chew. He reports that he drinks alcohol. He reports that he does not use illicit drugs.  Allergies:  Allergies  Allergen Reactions  . Hydrochlorothiazide Rash and Palpitations  . Asa [Aspirin] Nausea Only  . Hydrocodone Itching and Other (See Comments)    Jittery, hyperactivity   . Amoxicillin Itching and Rash  . Indapamide Palpitations    Lightheadedness, dizziness    Medications:   Medication List    ASK your doctor about these medications        acetaminophen 500 MG tablet  Commonly known as:  TYLENOL  Take 1,000 mg by mouth 3 (three) times daily as needed for mild pain or moderate pain.     CENTRUM SILVER ULTRA MENS PO  Take 1 tablet by mouth daily.     DIOVAN 80 MG tablet  Generic drug:  valsartan  TAKE 1 TABLET DAILY     DULCOLAX PO  Take 3 capsules by mouth daily.     ezetimibe-simvastatin 10-40 MG per tablet  Commonly known as:  VYTORIN  Take 1 tablet by mouth at bedtime.     psyllium 58.6 % powder  Commonly known as:  METAMUCIL  Take 1 packet by mouth every other day.     ranitidine 150 MG tablet  Commonly known as:  ZANTAC  Take 300 mg by mouth at bedtime.     traMADol 50 MG tablet  Commonly known as:  ULTRAM  Take 50-100 mg by mouth 3 (three) times daily as needed for moderate pain.        Please HPI for pertinent positives, otherwise complete 10 system ROS negative.  Physical Exam: BP 122/69 mmHg  Pulse 51  Temp(Src) 98 F (36.7 C) (Oral)  Resp 16  SpO2 99% There is no weight on file to calculate BMI.   General Appearance:  Alert, cooperative, no distress, appears stated age  Head:  Normocephalic, without obvious abnormality, atraumatic  ENT: Unremarkable  Neck: Supple, symmetrical, trachea midline  Lungs:   Clear to auscultation bilaterally, no w/r/r, respirations unlabored without use of accessory muscles.  Chest Wall:  No tenderness or deformity  Heart:  Regular rate and rhythm, S1, S2 normal, no murmur, rub or gallop.  Abdomen:   Soft, non-tender, non distended.  Neurologic: Normal affect, no gross deficits.  Labs: Results for orders placed or performed during the hospital encounter of 03/27/15 (from the past 48 hour(s))  CBC upon arrival     Status: None   Collection Time: 03/27/15  7:20 AM  Result Value Ref Range   WBC 5.3 4.0 - 10.5 K/uL   RBC 4.40 4.22 - 5.81 MIL/uL   Hemoglobin 14.8 13.0 - 17.0 g/dL   HCT 43.1 39.0 - 52.0 %   MCV 98.0 78.0 - 100.0 fL   MCH 33.6 26.0 - 34.0 pg   MCHC 34.3 30.0 - 36.0 g/dL   RDW 13.3 11.5 - 15.5 %   Platelets 263 150 - 400 K/uL  Protime-INR upon arrival     Status: None   Collection Time: 03/27/15  7:20 AM  Result Value Ref Range   Prothrombin Time 13.1 11.6 - 15.2 seconds   INR 0.98 0.00 - 1.49    Imaging: No results found.  Assessment/Plan Monoclonal gammopathy Bone marrow biopsy Risks and Benefits discussed with the patient including, but not limited to bleeding, infection, damage to adjacent structures or low yield requiring additional tests. All of the patient's questions were  answered, patient is agreeable to proceed. Consent signed and in chart.   Ascencion Dike PA-C 03/27/2015, 8:27 AM

## 2015-03-31 ENCOUNTER — Other Ambulatory Visit: Payer: Self-pay | Admitting: Hematology

## 2015-03-31 DIAGNOSIS — C9000 Multiple myeloma not having achieved remission: Secondary | ICD-10-CM

## 2015-04-01 ENCOUNTER — Telehealth: Payer: Self-pay | Admitting: Hematology

## 2015-04-01 NOTE — Telephone Encounter (Signed)
Confirm appointment for 04/05

## 2015-04-02 ENCOUNTER — Other Ambulatory Visit (HOSPITAL_BASED_OUTPATIENT_CLINIC_OR_DEPARTMENT_OTHER): Payer: Medicare Other

## 2015-04-02 ENCOUNTER — Ambulatory Visit (HOSPITAL_BASED_OUTPATIENT_CLINIC_OR_DEPARTMENT_OTHER): Payer: Medicare Other | Admitting: Hematology

## 2015-04-02 ENCOUNTER — Telehealth: Payer: Self-pay | Admitting: *Deleted

## 2015-04-02 ENCOUNTER — Telehealth: Payer: Self-pay | Admitting: Hematology

## 2015-04-02 VITALS — BP 134/58 | HR 63 | Temp 98.1°F | Resp 18 | Ht 69.0 in | Wt 207.0 lb

## 2015-04-02 DIAGNOSIS — G893 Neoplasm related pain (acute) (chronic): Secondary | ICD-10-CM

## 2015-04-02 DIAGNOSIS — M8580 Other specified disorders of bone density and structure, unspecified site: Secondary | ICD-10-CM | POA: Diagnosis not present

## 2015-04-02 DIAGNOSIS — C9000 Multiple myeloma not having achieved remission: Secondary | ICD-10-CM

## 2015-04-02 DIAGNOSIS — D472 Monoclonal gammopathy: Secondary | ICD-10-CM | POA: Diagnosis present

## 2015-04-02 DIAGNOSIS — Z8589 Personal history of malignant neoplasm of other organs and systems: Secondary | ICD-10-CM

## 2015-04-02 LAB — COMPREHENSIVE METABOLIC PANEL (CC13)
ALT: 53 U/L (ref 0–55)
ANION GAP: 8 meq/L (ref 3–11)
AST: 36 U/L — ABNORMAL HIGH (ref 5–34)
Albumin: 4.3 g/dL (ref 3.5–5.0)
Alkaline Phosphatase: 192 U/L — ABNORMAL HIGH (ref 40–150)
BUN: 10.7 mg/dL (ref 7.0–26.0)
CO2: 19 meq/L — AB (ref 22–29)
CREATININE: 1.6 mg/dL — AB (ref 0.7–1.3)
Calcium: 9.5 mg/dL (ref 8.4–10.4)
Chloride: 110 mEq/L — ABNORMAL HIGH (ref 98–109)
EGFR: 45 mL/min/{1.73_m2} — ABNORMAL LOW (ref >90)
Glucose: 94 mg/dl (ref 70–140)
Potassium: 3.5 mEq/L (ref 3.5–5.1)
Sodium: 137 mEq/L (ref 136–145)
TOTAL PROTEIN: 8.8 g/dL — AB (ref 6.4–8.3)
Total Bilirubin: 0.52 mg/dL (ref 0.20–1.20)

## 2015-04-02 LAB — LACTATE DEHYDROGENASE (CC13): LDH: 171 U/L (ref 125–245)

## 2015-04-02 NOTE — Progress Notes (Signed)
[facility]  [phone] [phone]  Clinic New Consult Note   Patient Care Team: [name], MD as PCP - General (Internal Medicine) 04/02/2015  CHIEF COMPLAINTS: Follow-up multiple myeloma  HISTORY OF PRESENTING ILLNESS:  Eric Dudley 64 y.o. 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  He has been having low back pain for 3-4 months. He had food posinin 4 month ago, and had frequent diarrhea. He started noticed low back pain since then. The pain is not radiating to leg, he also has intermittent right rib pain, worse with cough and sneezing. He remains to be physically active, able to do all the activities, such as gardening work housework without much limitation, but he doesn't sings slowly because of back pain. He takes Tylenol as needed, does not like the necrotic pain medication. No night sweats, no fever or chillss, no weight loss.   He was seen by his primary care physician Dr. [name], MD and had lab test (see below). He also had bone density scan 2/25 which showed osteoporosis, has not been treated yet.  INTERIM HISTORY: Eric Dudley returns for follow-up. He is clinically stable, with mild to moderate back pain and rib pain, which does not require any pain medication. He is able to function well at home, without limitation in his activities. He denies any other new pain, dyspnea, nausea or other symptoms. He has good appetite and weight is stable.  MEDICAL HISTORY:  Past Medical History  Diagnosis Date  . Hypertension   . Dyslipidemia   . Tonsillar cancer 2006  . Cholelithiases     SURGICAL HISTORY: Past Surgical History  Procedure Laterality Date  . Appendectomy  1962  . Shoulder surgery  1999    left  . Knee surgery  2012    right    SOCIAL HISTORY: History   Social History  . Marital  Status: Married    Spouse Name: N/A  . Number of Children: 1   . Years of Education: N/A   Occupational History  . A retired Therapist, occupational    Social History Main Topics  . Smoking status: Former Smoker    Types: Cigars    Quit date: 12/29/2003  . Smokeless tobacco: Former Systems developer    Types: Chew    Quit date: 12/28/1994  . Alcohol Use: Yes     Comment: occasional beer  . Drug Use: No  . Sexual Activity: Not on file   Other Topics Concern  . Not on file   Social History Narrative  . No narrative on file    FAMILY HISTORY: Family History  Problem Relation Age of Onset  . Cancer Mother 47    cervical cancer   . Cancer Father     unknown cancer   . Hypertension Sister   . Cancer Sister     melanoma     ALLERGIES:  is allergic to hydrochlorothiazide; asa; hydrocodone; amoxicillin; and indapamide.  MEDICATIONS:  Current Outpatient Prescriptions  Medication Sig Dispense Refill  . acetaminophen (TYLENOL) 500 MG tablet Take 1,000 mg by mouth 3 (three) times daily as needed for mild pain or moderate pain.    . Bisacodyl (DULCOLAX PO) Take 3 capsules by mouth daily.    [name] DIOVAN 80 MG tablet TAKE 1 TABLET DAILY 90 tablet 0  . ezetimibe-simvastatin (VYTORIN) 10-40 MG per tablet Take 1  tablet by mouth at bedtime.    . Multiple Vitamins-Minerals (CENTRUM SILVER ULTRA MENS PO) Take 1 tablet by mouth daily.    . psyllium (METAMUCIL) 58.6 % powder Take 1 packet by mouth every other day.    . ranitidine (ZANTAC) 150 MG tablet Take 300 mg by mouth at bedtime.     . traMADol (ULTRAM) 50 MG tablet Take 50-100 mg by mouth 3 (three) times daily as needed for moderate pain.     No current facility-administered medications for this visit.    REVIEW OF SYSTEMS:   Constitutional: Denies fevers, chills or abnormal night sweats Eyes: Denies blurriness of vision, double vision or watery eyes Ears, nose, mouth, throat, and face: Denies mucositis or sore throat Respiratory: Denies cough,  dyspnea or wheezes Cardiovascular: Denies palpitation, chest discomfort or lower extremity swelling Gastrointestinal:  Denies nausea, heartburn or change in bowel habits Skin: Denies abnormal skin rashes Lymphatics: Denies new lymphadenopathy or easy bruising Neurological:Denies numbness, tingling or new weaknesses Behavioral/Psych: Mood is stable, no new changes  Musculoskeletal: Positive for back pain and intermittent rib pain All other systems were reviewed with the patient and are negative.  PHYSICAL EXAMINATION: ECOG PERFORMANCE STATUS: 1 - Symptomatic but completely ambulatory  Filed Vitals:   04/02/15 1415  BP: 134/58  Pulse: 63  Temp: 98.1 F (36.7 C)  Resp: 18   Filed Weights   04/02/15 1415  Weight: 207 lb (93.895 kg)    GENERAL:alert, no distress and comfortable SKIN: skin color, texture, turgor are normal, no rashes or significant lesions EYES: normal, conjunctiva are pink and non-injected, sclera clear OROPHARYNX:no exudate, no erythema and lips, buccal mucosa, and tongue normal  NECK: supple, thyroid normal size, non-tender, without nodularity LYMPH:  no palpable lymphadenopathy in the cervical, axillary or inguinal LUNGS: clear to auscultation and percussion with normal breathing effort HEART: regular rate & rhythm and no murmurs and no lower extremity edema ABDOMEN:abdomen soft, non-tender and normal bowel sounds Musculoskeletal:no cyanosis of digits and no clubbing  PSYCH: alert & oriented x 3 with fluent speech NEURO: no focal motor/sensory deficits  LABORATORY DATA:  I have reviewed the data as listed I have personally reviewed the radiological images as listed and agreed with the findings in the report. Lab from 01/31/2015 CMP: Albumin 5.1, alkaline phosphate 161, ALT 41, AST 28, bilirubin 0.4, BUN 8, calcium 9.7, creatinine 1.47, total protein 8.6, sodium 138, potassium 3.9. HbA1c: 5.9 Protein electrophoresis: Total protein 8.2,, globulin 1.9, M spike  1.7G/DL Random urine protein electrophoresis: Protein 230MG /DL, M spike 52% CBC on 09/07/2014: WBC 5.9: Hemoglobin 14.8, hematocrit 42.9%, MCV 94.5, platelet count 301k  CBC Latest Ref Rng 03/27/2015 03/15/2015  WBC 4.0 - 10.5 K/uL 5.3 5.9  Hemoglobin 13.0 - 17.0 g/dL 14.8 14.6  Hematocrit 39.0 - 52.0 % 43.1 41.7  Platelets 150 - 400 K/uL 263 264    CMP Latest Ref Rng 04/02/2015 03/15/2015  Glucose 70 - 140 mg/dl 94 96  BUN 7.0 - 26.0 mg/dL 10.7 8.5  Creatinine 0.7 - 1.3 mg/dL 1.6(H) 1.5(H)  Sodium 136 - 145 mEq/L 137 135(L)  Potassium 3.5 - 5.1 mEq/L 3.5 3.4(L)  CO2 22 - 29 mEq/L 19(L) 19(L)  Calcium 8.4 - 10.4 mg/dL 9.5 9.5  Total Protein 6.4 - 8.3 g/dL 8.8(H) 8.7(H)  Total Bilirubin 0.20 - 1.20 mg/dL 0.52 0.50  Alkaline Phos 40 - 150 U/L 192(H) 196(H)  AST 5 - 34 U/L 36(H) 31  ALT 0 - 55 U/L 53 43  Kappa/lambda light chains  Status: Finalresult Visible to patient:  MyChart Nextappt: 04/04/2015 at 10:00 AM in Oncology ([facility] CHEMO EDU) Dx:  MGUS (monoclonal gammopathy of unknow...            Ref Range 2wk ago    Kappa free light chain 0.33 - 1.94 mg/dL 690.00 (H)   Lambda Free Lght Chn 0.57 - 2.63 mg/dL 0.13 (L)   Kappa:Lambda Ratio 0.26 - 1.65  *          PATHOLOGY REPORT Bone Marrow, Aspirate,Biopsy, and Clot, right iliac 03/27/2015 - HYPERCELLULAR BONE MARROW FOR AGE WITH PLASMA CELL NEOPLASM. - SEE COMMENT. PERIPHERAL BLOOD: - NO SIGNIFICANT MORPHOLOGIC ABNORMALITIES. Diagnosis Note The bone marrow shows increased number of atypical plasma cells representing 47% of all cells in the aspirate associated with prominent interstitial infiltrates and variably sized aggregates in the clot and biopsy sections. Immunohistochemical stains show that the plasma cells are kappa light chain restricted consistent with plasma cell neoplasm. The background shows trilineage  Cytogenetics  RADIOGRAPHIC STUDIES: Bone survey 03/19/2015 IMPRESSION: No  discrete lytic lesion, but there is osteopenia and calvarial heterogeneity -nonspecific findings which can be manifestations of Myeloma  ASSESSMENT & PLAN:  64 year old Caucasian male with past medical history of hypertension, tonsil cancer status post surgery and radiation 10 years ago, now presented with 3-4 months low back pain and intermittent rib pain. His blood test showed M protein 1.7g/dl.  1. IgG multiple myeloma, stage I, standard risk by cytogenetics -I discussed his bone marrow biopsy results with him. He has significant amount of plasma cells in the bone marrow 47%.  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 Based on the new notable myeloma diagnostic criteria, he meets the criteria of multiple myeloma. He has normal abdomen and a mildly elevated Michael clubbing, this is stage I. -His cytogenetics and Fish panel reviewed standard risk. -I'll obtain a PET scan to further eval his bone lesions, given clinical bone pain but no lytic lesion on the bone survey. -I recommend induction chemotherapy with VRD regimen, weekly Velcade and dexamethasone, the Revlimid dose will be 10 mg daily 3 week on, one-week off, based on his renal function. -Chemotherapy consent: Side effects including but does not not limited to, fatigue, nausea, vomiting, diarrhea,  neuropathy, fluid retention, renal and kidney dysfunction, neutropenic fever, needed for blood transfusion, bleeding, were discussed with patient in great detail. He agrees to proceed. -The goal of treatment is disease control, potential cure with bone marrow transplant -He is a candidate for bone marrow transplant. I'll refer him to [facility] for bone marrow transplant  evaluation -I'll tentatively start his chemotherapy next week. -Revlimid prescription was sent out today. -Chemotherapy class next week.    2. Osteopenia and MM related bone pain  -We'll likely give him monthly pamidronate   3. Hypertension -Follow up with primary care physician.  Follow-up: I'll see him back in 2 weeks for follow up   All questions were answered. The patient knows to call the clinic with any problems, questions or concerns. I spent 30 minutes counseling the patient face to face. The total time spent in the appointment was 45 minutes and more than 50% was on counseling.     Truitt Merle, MD 04/02/2015 2:38  PM

## 2015-04-02 NOTE — Telephone Encounter (Signed)
Gave avs & calendar for April/May. Sent message to schedule treatment. °

## 2015-04-02 NOTE — Telephone Encounter (Signed)
Per staff message and POF I have scheduled appts. Advised scheduler of appts. JMW  

## 2015-04-03 ENCOUNTER — Encounter: Payer: Self-pay | Admitting: Hematology

## 2015-04-03 DIAGNOSIS — C9001 Multiple myeloma in remission: Secondary | ICD-10-CM | POA: Insufficient documentation

## 2015-04-03 LAB — TISSUE HYBRIDIZATION (BONE MARROW)-NCBH

## 2015-04-03 LAB — CHROMOSOME ANALYSIS, BONE MARROW

## 2015-04-03 MED ORDER — LENALIDOMIDE 10 MG PO CAPS: 10.0000 mg | ORAL_CAPSULE | Freq: Every day | ORAL | Status: DC

## 2015-04-04 ENCOUNTER — Other Ambulatory Visit: Payer: Self-pay | Admitting: *Deleted

## 2015-04-04 ENCOUNTER — Encounter: Payer: Self-pay | Admitting: *Deleted

## 2015-04-04 ENCOUNTER — Other Ambulatory Visit: Payer: Medicare Other

## 2015-04-04 ENCOUNTER — Telehealth: Payer: Self-pay | Admitting: *Deleted

## 2015-04-04 LAB — BETA 2 MICROGLOBULIN, SERUM: Beta-2 Microglobulin: 2.79 mg/L — ABNORMAL HIGH (ref <=2.51)

## 2015-04-04 NOTE — Progress Notes (Signed)
Put form on nurses desk from [facility] for patient to get a disability tag. Will pick up at first chemo treatment on 4/12.

## 2015-04-04 NOTE — Addendum Note (Signed)
Addended by: Truitt Merle on: 04/04/2015 05:33 PM   Modules accepted: Orders

## 2015-04-04 NOTE — Telephone Encounter (Signed)
Pt-Physician form faxed to [employer] & received message that pt had been successfully enrolled in the revlimid  [employer] program.  Faxed script for revlimid 10 mg # 21 to take daily  to [facility] @ [phone].  Auth # [id number].  Pt notified.

## 2015-04-05 ENCOUNTER — Telehealth: Payer: Self-pay

## 2015-04-05 ENCOUNTER — Encounter: Payer: Medicare Other | Admitting: *Deleted

## 2015-04-05 ENCOUNTER — Encounter: Payer: Self-pay | Admitting: *Deleted

## 2015-04-05 DIAGNOSIS — C9000 Multiple myeloma not having achieved remission: Secondary | ICD-10-CM

## 2015-04-05 MED ORDER — LENALIDOMIDE 10 MG PO CAPS: 10.0000 mg | ORAL_CAPSULE | Freq: Every day | ORAL | Status: DC

## 2015-04-05 NOTE — Telephone Encounter (Signed)
Called Jasmine at [facility] back about revlimid verification. She stated that the Rx will need to be transferred to [facility] per [employer]. Phone [phone]. Fax#[phone]. [facility] called and they said we will need to refax the RX. rx prepared for MD and given to the RN.

## 2015-04-08 ENCOUNTER — Other Ambulatory Visit: Payer: TRICARE For Life (TFL)

## 2015-04-08 ENCOUNTER — Ambulatory Visit: Payer: TRICARE For Life (TFL) | Admitting: Hematology

## 2015-04-09 ENCOUNTER — Ambulatory Visit: Payer: TRICARE For Life (TFL)

## 2015-04-09 ENCOUNTER — Telehealth: Payer: Self-pay | Admitting: *Deleted

## 2015-04-09 ENCOUNTER — Other Ambulatory Visit: Payer: Self-pay | Admitting: Hematology

## 2015-04-09 ENCOUNTER — Other Ambulatory Visit (HOSPITAL_BASED_OUTPATIENT_CLINIC_OR_DEPARTMENT_OTHER): Payer: Medicare Other

## 2015-04-09 ENCOUNTER — Telehealth: Payer: Self-pay | Admitting: Hematology

## 2015-04-09 ENCOUNTER — Other Ambulatory Visit: Payer: Self-pay | Admitting: *Deleted

## 2015-04-09 DIAGNOSIS — C9000 Multiple myeloma not having achieved remission: Secondary | ICD-10-CM | POA: Diagnosis present

## 2015-04-09 LAB — CBC & DIFF AND RETIC
BASO%: 0.2 % (ref 0.0–2.0)
BASOS ABS: 0 10*3/uL (ref 0.0–0.1)
EOS ABS: 0 10*3/uL (ref 0.0–0.5)
EOS%: 0.2 % (ref 0.0–7.0)
HEMATOCRIT: 43.2 % (ref 38.4–49.9)
HEMOGLOBIN: 14.9 g/dL (ref 13.0–17.1)
Immature Retic Fract: 10.9 % — ABNORMAL HIGH (ref 3.00–10.60)
LYMPH%: 24.9 % (ref 14.0–49.0)
MCH: 33.3 pg (ref 27.2–33.4)
MCHC: 34.5 g/dL (ref 32.0–36.0)
MCV: 96.4 fL (ref 79.3–98.0)
MONO#: 0.9 10*3/uL (ref 0.1–0.9)
MONO%: 15.4 % — AB (ref 0.0–14.0)
NEUT#: 3.6 10*3/uL (ref 1.5–6.5)
NEUT%: 59.3 % (ref 39.0–75.0)
PLATELETS: 276 10*3/uL (ref 140–400)
RBC: 4.48 10*6/uL (ref 4.20–5.82)
RDW: 13.1 % (ref 11.0–14.6)
Retic %: 1.39 % (ref 0.80–1.80)
Retic Ct Abs: 62.27 10*3/uL (ref 34.80–93.90)
WBC: 6.1 10*3/uL (ref 4.0–10.3)
lymph#: 1.5 10*3/uL (ref 0.9–3.3)

## 2015-04-09 LAB — COMPREHENSIVE METABOLIC PANEL (CC13)
ALT: 41 U/L (ref 0–55)
ANION GAP: 7 meq/L (ref 3–11)
AST: 28 U/L (ref 5–34)
Albumin: 4.3 g/dL (ref 3.5–5.0)
Alkaline Phosphatase: 188 U/L — ABNORMAL HIGH (ref 40–150)
BUN: 10.7 mg/dL (ref 7.0–26.0)
CO2: 19 meq/L — AB (ref 22–29)
CREATININE: 1.6 mg/dL — AB (ref 0.7–1.3)
Calcium: 9.6 mg/dL (ref 8.4–10.4)
Chloride: 111 mEq/L — ABNORMAL HIGH (ref 98–109)
EGFR: 46 mL/min/{1.73_m2} — AB (ref >90)
GLUCOSE: 91 mg/dL (ref 70–140)
Potassium: 3.3 mEq/L — ABNORMAL LOW (ref 3.5–5.1)
Sodium: 137 mEq/L (ref 136–145)
Total Bilirubin: 0.49 mg/dL (ref 0.20–1.20)
Total Protein: 8.9 g/dL — ABNORMAL HIGH (ref 6.4–8.3)

## 2015-04-09 NOTE — Telephone Encounter (Signed)
Left message to confirm appointment for 04/15

## 2015-04-09 NOTE — Telephone Encounter (Signed)
SPOKE TO DR.FENG'S NURSE, MYRTLE [place]. NOTIFIED PT. HE VOICES UNDERSTANDING.

## 2015-04-09 NOTE — Telephone Encounter (Signed)
Per staff message and POF I have scheduled appts. Advised scheduler of appts. JMW  

## 2015-04-09 NOTE — Telephone Encounter (Signed)
PT. IS FOR CHEMOTHERAPY TODAY. WILL HE RECEIVE HIS TREATMENT SINCE THE REVLIMID HAS NOT ARRIVED?

## 2015-04-12 ENCOUNTER — Other Ambulatory Visit (HOSPITAL_COMMUNITY)
Admission: RE | Admit: 2015-04-12 | Discharge: 2015-04-12 | Disposition: A | Discharge status: Home / Self Care | Payer: Medicare Other | Source: Ambulatory Visit | Attending: Hematology | Admitting: Hematology

## 2015-04-12 ENCOUNTER — Other Ambulatory Visit (HOSPITAL_BASED_OUTPATIENT_CLINIC_OR_DEPARTMENT_OTHER): Payer: Medicare Other

## 2015-04-12 ENCOUNTER — Other Ambulatory Visit: Payer: Self-pay | Admitting: Hematology

## 2015-04-12 ENCOUNTER — Ambulatory Visit (HOSPITAL_BASED_OUTPATIENT_CLINIC_OR_DEPARTMENT_OTHER): Payer: Medicare Other

## 2015-04-12 ENCOUNTER — Other Ambulatory Visit: Payer: Self-pay | Admitting: *Deleted

## 2015-04-12 ENCOUNTER — Encounter (HOSPITAL_COMMUNITY): Payer: Self-pay

## 2015-04-12 VITALS — BP 149/69 | HR 67 | Temp 97.7°F | Resp 18

## 2015-04-12 DIAGNOSIS — C9000 Multiple myeloma not having achieved remission: Secondary | ICD-10-CM

## 2015-04-12 DIAGNOSIS — Z5112 Encounter for antineoplastic immunotherapy: Secondary | ICD-10-CM | POA: Diagnosis present

## 2015-04-12 DIAGNOSIS — C099 Malignant neoplasm of tonsil, unspecified: Secondary | ICD-10-CM | POA: Diagnosis not present

## 2015-04-12 LAB — CBC & DIFF AND RETIC
BASO%: 0.1 % (ref 0.0–2.0)
BASOS ABS: 0 10*3/uL (ref 0.0–0.1)
EOS%: 0.4 % (ref 0.0–7.0)
Eosinophils Absolute: 0 10*3/uL (ref 0.0–0.5)
HEMATOCRIT: 42.3 % (ref 38.4–49.9)
HGB: 14.9 g/dL (ref 13.0–17.1)
Immature Retic Fract: 4.8 % (ref 3.00–10.60)
LYMPH%: 14 % (ref 14.0–49.0)
MCH: 33.9 pg — ABNORMAL HIGH (ref 27.2–33.4)
MCHC: 35.2 g/dL (ref 32.0–36.0)
MCV: 96.1 fL (ref 79.3–98.0)
MONO#: 0.6 10*3/uL (ref 0.1–0.9)
MONO%: 8 % (ref 0.0–14.0)
NEUT#: 5.8 10*3/uL (ref 1.5–6.5)
NEUT%: 77.5 % — ABNORMAL HIGH (ref 39.0–75.0)
Platelets: 293 10*3/uL (ref 140–400)
RBC: 4.4 10*6/uL (ref 4.20–5.82)
RDW: 13.1 % (ref 11.0–14.6)
Retic %: 1.43 % (ref 0.80–1.80)
Retic Ct Abs: 62.92 10*3/uL (ref 34.80–93.90)
WBC: 7.5 10*3/uL (ref 4.0–10.3)
lymph#: 1.1 10*3/uL (ref 0.9–3.3)

## 2015-04-12 LAB — COMPREHENSIVE METABOLIC PANEL
ALT: 48 U/L (ref 0–53)
AST: 31 U/L (ref 0–37)
Albumin: 4.8 g/dL (ref 3.5–5.2)
Alkaline Phosphatase: 171 U/L — ABNORMAL HIGH (ref 39–117)
Anion gap: 5 (ref 5–15)
BILIRUBIN TOTAL: 0.6 mg/dL (ref 0.3–1.2)
BUN: 10 mg/dL (ref 6–23)
CO2: 22 mmol/L (ref 19–32)
CREATININE: 1.54 mg/dL — AB (ref 0.50–1.35)
Calcium: 10.3 mg/dL (ref 8.4–10.5)
Chloride: 111 mmol/L (ref 96–112)
GFR calc Af Amer: 53 mL/min — ABNORMAL LOW (ref >90)
GFR calc non Af Amer: 46 mL/min — ABNORMAL LOW (ref >90)
Glucose, Bld: 120 mg/dL — ABNORMAL HIGH (ref 70–99)
Potassium: 3.1 mmol/L — ABNORMAL LOW (ref 3.5–5.1)
SODIUM: 138 mmol/L (ref 135–145)
Total Protein: 9.7 g/dL — ABNORMAL HIGH (ref 6.0–8.3)

## 2015-04-12 MED ORDER — SODIUM CHLORIDE 0.9 % IV SOLN
250.0000 mL | Freq: Once | INTRAVENOUS | Status: AC
  Administered 2015-04-12: 250 mL via INTRAVENOUS

## 2015-04-12 MED ORDER — ONDANSETRON HCL 8 MG PO TABS
8.0000 mg | ORAL_TABLET | Freq: Once | ORAL | Status: AC
  Administered 2015-04-12: 8 mg via ORAL

## 2015-04-12 MED ORDER — BORTEZOMIB CHEMO SQ INJECTION 3.5 MG (2.5MG/ML)
1.3000 mg/m2 | Freq: Once | INTRAMUSCULAR | Status: AC
  Administered 2015-04-12: 2.75 mg via SUBCUTANEOUS
  Filled 2015-04-12: qty 2.75

## 2015-04-12 MED ORDER — ONDANSETRON HCL 8 MG PO TABS
ORAL_TABLET | ORAL | Status: AC
  Filled 2015-04-12: qty 1

## 2015-04-12 MED ORDER — DEXAMETHASONE 4 MG PO TABS: 40.0000 mg | ORAL_TABLET | ORAL | Status: DC

## 2015-04-12 MED ORDER — ACYCLOVIR 400 MG PO TABS: 400.0000 mg | ORAL_TABLET | Freq: Two times a day (BID) | ORAL | Status: DC

## 2015-04-12 MED ORDER — POTASSIUM CHLORIDE CRYS ER 20 MEQ PO TBCR: 20.0000 meq | EXTENDED_RELEASE_TABLET | Freq: Two times a day (BID) | ORAL | Status: DC

## 2015-04-12 MED ORDER — DEXAMETHASONE 4 MG PO TABS
40.0000 mg | ORAL_TABLET | Freq: Once | ORAL | Status: AC
  Administered 2015-04-12: 40 mg via ORAL

## 2015-04-12 MED ORDER — DEXAMETHASONE 4 MG PO TABS
ORAL_TABLET | ORAL | Status: AC
  Filled 2015-04-12: qty 10

## 2015-04-12 MED ORDER — HYDROCODONE-ACETAMINOPHEN 5-325 MG PO TABS
1.0000 | ORAL_TABLET | Freq: Four times a day (QID) | ORAL | Status: DC | PRN
Start: 2015-04-12 — End: 2015-04-23

## 2015-04-12 MED ORDER — ONDANSETRON HCL 8 MG PO TABS: 8.0000 mg | ORAL_TABLET | Freq: Two times a day (BID) | ORAL | Status: DC | PRN

## 2015-04-12 NOTE — Patient Instructions (Addendum)
[facility] Discharge Instructions for Patients Receiving Chemotherapy  Today you received the following chemotherapy agents:Velcade   To help prevent nausea and vomiting after your treatment, we encourage you to take your nausea medication as directed.   If you develop nausea and vomiting that is not controlled by your nausea medication, call the clinic.   BELOW ARE SYMPTOMS THAT SHOULD BE REPORTED IMMEDIATELY:  *FEVER GREATER THAN 100.5 F  *CHILLS WITH OR WITHOUT FEVER  NAUSEA AND VOMITING THAT IS NOT CONTROLLED WITH YOUR NAUSEA MEDICATION  *UNUSUAL SHORTNESS OF BREATH  *UNUSUAL BRUISING OR BLEEDING  TENDERNESS IN MOUTH AND THROAT WITH OR WITHOUT PRESENCE OF ULCERS  *URINARY PROBLEMS  *BOWEL PROBLEMS  UNUSUAL RASH Items with * indicate a potential emergency and should be followed up as soon as possible.  Feel free to call the clinic you have any questions or concerns. The clinic phone number is [phone].  Please show the CHEMO ALERT CARD at check-in to the Emergency Department and triage nurse.   Bortezomib injection What is this medicine? BORTEZOMIB (bor TEZ oh mib) is a chemotherapy drug. It slows the growth of cancer cells. This medicine is used to treat multiple myeloma, and certain lymphomas, such as mantle-cell lymphoma. This medicine may be used for other purposes; ask your health care provider or pharmacist if you have questions. COMMON BRAND NAME(S): Velcade What should I tell my health care provider before I take this medicine? They need to know if you have any of these conditions: -diabetes -heart disease -irregular heartbeat -liver disease -on hemodialysis -low blood counts, like low white blood cells, platelets, or hemoglobin -peripheral neuropathy -taking medicine for blood pressure -an unusual or allergic reaction to bortezomib, mannitol, boron, other medicines, foods, dyes, or preservatives -pregnant or trying to get  pregnant -breast-feeding How should I use this medicine? This medicine is for injection into a vein or for injection under the skin. It is given by a health care professional in a hospital or clinic setting. Talk to your pediatrician regarding the use of this medicine in children. Special care may be needed. Overdosage: If you think you have taken too much of this medicine contact a poison control center or emergency room at once. NOTE: This medicine is only for you. Do not share this medicine with others. What if I miss a dose? It is important not to miss your dose. Call your doctor or health care professional if you are unable to keep an appointment. What may interact with this medicine? This medicine may interact with the following medications: -ketoconazole -rifampin -ritonavir -St. John's Wort This list may not describe all possible interactions. Give your health care provider a list of all the medicines, herbs, non-prescription drugs, or dietary supplements you use. Also tell them if you smoke, drink alcohol, or use illegal drugs. Some items may interact with your medicine. What should I watch for while using this medicine? Visit your doctor for checks on your progress. This drug may make you feel generally unwell. This is not uncommon, as chemotherapy can affect healthy cells as well as cancer cells. Report any side effects. Continue your course of treatment even though you feel ill unless your doctor tells you to stop. You may get drowsy or dizzy. Do not drive, use machinery, or do anything that needs mental alertness until you know how this medicine affects you. Do not stand or sit up quickly, especially if you are an older patient. This reduces the risk of dizzy   dizzy or fainting spells. In some cases, you may be given additional medicines to help with side effects. Follow all directions for their use. Call your doctor or health care professional for advice if you get a fever, chills or sore  throat, or other symptoms of a cold or flu. Do not treat yourself. This drug decreases your body's ability to fight infections. Try to avoid being around people who are sick. This medicine may increase your risk to bruise or bleed. Call your doctor or health care professional if you notice any unusual bleeding. You may need blood work done while you are taking this medicine. In some patients, this medicine may cause a serious brain infection that may cause death. If you have any problems seeing, thinking, speaking, walking, or standing, tell your doctor right away. If you cannot reach your doctor, urgently seek other source of medical care. Do not become pregnant while taking this medicine. Women should inform their doctor if they wish to become pregnant or think they might be pregnant. There is a potential for serious side effects to an unborn child. Talk to your health care professional or pharmacist for more information. Do not breast-feed an infant while taking this medicine. Check with your doctor or health care professional if you get an attack of severe diarrhea, nausea and vomiting, or if you sweat a lot. The loss of too much body fluid can make it dangerous for you to take this medicine. What side effects may I notice from receiving this medicine? Side effects that you should report to your doctor or health care professional as soon as possible: -allergic reactions like skin rash, itching or hives, swelling of the face, lips, or tongue -breathing problems -changes in hearing -changes in vision -fast, irregular heartbeat -feeling faint or lightheaded, falls -pain, tingling, numbness in the hands or feet -right upper belly pain -seizures -swelling of the ankles, feet, hands -unusual bleeding or bruising -unusually weak or tired -vomiting -yellowing of the eyes or skin Side effects that usually do not require medical attention (report to your doctor or health care professional if they  continue or are bothersome): -changes in emotions or moods -constipation -diarrhea -loss of appetite -headache -irritation at site where injected -nausea This list may not describe all possible side effects. Call your doctor for medical advice about side effects. You may report side effects to FDA at 1-800-FDA-1088. Where should I keep my medicine? This drug is given in a hospital or clinic and will not be stored at home. NOTE: This sheet is a summary. It may not cover all possible information. If you have questions about this medicine, talk to your doctor, pharmacist, or health care provider.  2015, Elsevier/Gold Standard. (2013-10-09 12:46:32)

## 2015-04-12 NOTE — Progress Notes (Signed)
1130: Pt c/o feeling very cold, redness to face.  Pt became very emotional and tearful.  States he feels overwhelmed.  PIV started to right forearm as precaution and normal saline initiated.  Vital signs stable.  Dr. Burr Medico notified.    1210: Dr. Burr Medico at chairside to reassess patient.  Okay to discharge home. Vitals stable. Pt states he feels much better now. Elray Buba, RN at chairside discussing home med rx.   1215: Pt ambulatory and in no apparent distress at time of discharge with wife at side.

## 2015-04-15 ENCOUNTER — Other Ambulatory Visit: Payer: Self-pay | Admitting: *Deleted

## 2015-04-15 ENCOUNTER — Telehealth: Payer: Self-pay | Admitting: *Deleted

## 2015-04-15 ENCOUNTER — Other Ambulatory Visit: Payer: Self-pay | Admitting: Hematology

## 2015-04-15 NOTE — Telephone Encounter (Signed)
I called him back, he voiced understanding to take lasitives and pain meds. I will change his treatment appointments to Friday, and will see him on 4/22. POF sent  Truitt Merle  04/15/2015

## 2015-04-15 NOTE — Telephone Encounter (Signed)
Dr. Burr Medico called pt & this nurse also called pt to discuss chemo specifically.  Pt reports pain in lower back & left & right sides & when it hits him, it is severe.  He will try his pain med q 4 hr prn per Dr Ernestina Penna instructions.  He reports constipation & is taking senakot, metamucil, prunes, apricots & apples & is drinking lots of fluids.  He knows to call if further problems.  He reported no other symptoms & states no fever.

## 2015-04-15 NOTE — Telephone Encounter (Signed)
-----   Message from Oliver Hum, RN sent at 04/12/2015 11:57 AM EDT ----- Regarding: YF: Chemo Follow up call Patient of Dr. Burr Medico. First time Velcade. Pt became very cold after injection and tearful, states he is overwhelmed with everything. Wife is with him.  Dr. Burr Medico aware. Please follow up with him. Thanks!

## 2015-04-15 NOTE — Telephone Encounter (Signed)
TC from patient. States he started on sq velcade and revlimid on 04/12/15 as well as hydrocone/apap for lower back pain. Pt. states his pain in his lower back has gotten worse and is taking more pain meds. Also he has not had a bowel movement since 04/12/15-the day he started his chemo regimen.  He takes Reviewed side effects of pain meds- primarily constipation and instructed on use of stool softners ( takes Miralax daily right now) and adding Senna S (OTC) to his meds for constipation. He has an appt. With Dr. Burr Medico tomorrow (04/16/15) and will address his pain issues with her then, per patient's request.

## 2015-04-16 ENCOUNTER — Emergency Department (HOSPITAL_COMMUNITY): Payer: Medicare Other

## 2015-04-16 ENCOUNTER — Encounter (HOSPITAL_COMMUNITY): Payer: Self-pay | Admitting: Emergency Medicine

## 2015-04-16 ENCOUNTER — Inpatient Hospital Stay (HOSPITAL_COMMUNITY)
Admission: EM | Admit: 2015-04-16 | Discharge: 2015-04-23 | DRG: 516 | Disposition: A | Discharge status: Skilled Nursing Facility | Payer: Medicare Other | Attending: Internal Medicine | Admitting: Internal Medicine

## 2015-04-16 ENCOUNTER — Telehealth: Payer: Self-pay | Admitting: Hematology

## 2015-04-16 ENCOUNTER — Ambulatory Visit: Payer: TRICARE For Life (TFL) | Admitting: Nurse Practitioner

## 2015-04-16 ENCOUNTER — Inpatient Hospital Stay (HOSPITAL_COMMUNITY): Payer: Medicare Other

## 2015-04-16 ENCOUNTER — Ambulatory Visit: Payer: TRICARE For Life (TFL)

## 2015-04-16 ENCOUNTER — Other Ambulatory Visit: Payer: TRICARE For Life (TFL)

## 2015-04-16 DIAGNOSIS — M4854XA Collapsed vertebra, not elsewhere classified, thoracic region, initial encounter for fracture: Secondary | ICD-10-CM | POA: Diagnosis not present

## 2015-04-16 DIAGNOSIS — M4854XD Collapsed vertebra, not elsewhere classified, thoracic region, subsequent encounter for fracture with routine healing: Secondary | ICD-10-CM | POA: Diagnosis not present

## 2015-04-16 DIAGNOSIS — N182 Chronic kidney disease, stage 2 (mild): Secondary | ICD-10-CM | POA: Diagnosis not present

## 2015-04-16 DIAGNOSIS — Z885 Allergy status to narcotic agent status: Secondary | ICD-10-CM | POA: Diagnosis not present

## 2015-04-16 DIAGNOSIS — M545 Low back pain: Secondary | ICD-10-CM | POA: Diagnosis present

## 2015-04-16 DIAGNOSIS — M546 Pain in thoracic spine: Secondary | ICD-10-CM | POA: Diagnosis present

## 2015-04-16 DIAGNOSIS — Z880 Allergy status to penicillin: Secondary | ICD-10-CM

## 2015-04-16 DIAGNOSIS — S22089D Unspecified fracture of T11-T12 vertebra, subsequent encounter for fracture with routine healing: Secondary | ICD-10-CM | POA: Diagnosis not present

## 2015-04-16 DIAGNOSIS — R6889 Other general symptoms and signs: Secondary | ICD-10-CM | POA: Diagnosis not present

## 2015-04-16 DIAGNOSIS — E785 Hyperlipidemia, unspecified: Secondary | ICD-10-CM | POA: Diagnosis present

## 2015-04-16 DIAGNOSIS — N3289 Other specified disorders of bladder: Secondary | ICD-10-CM | POA: Diagnosis not present

## 2015-04-16 DIAGNOSIS — N179 Acute kidney failure, unspecified: Secondary | ICD-10-CM | POA: Diagnosis not present

## 2015-04-16 DIAGNOSIS — Z79899 Other long term (current) drug therapy: Secondary | ICD-10-CM

## 2015-04-16 DIAGNOSIS — M549 Dorsalgia, unspecified: Secondary | ICD-10-CM | POA: Diagnosis not present

## 2015-04-16 DIAGNOSIS — Z8041 Family history of malignant neoplasm of ovary: Secondary | ICD-10-CM | POA: Diagnosis not present

## 2015-04-16 DIAGNOSIS — F17220 Nicotine dependence, chewing tobacco, uncomplicated: Secondary | ICD-10-CM | POA: Diagnosis present

## 2015-04-16 DIAGNOSIS — K8020 Calculus of gallbladder without cholecystitis without obstruction: Secondary | ICD-10-CM | POA: Diagnosis present

## 2015-04-16 DIAGNOSIS — Z888 Allergy status to other drugs, medicaments and biological substances status: Secondary | ICD-10-CM

## 2015-04-16 DIAGNOSIS — M8448XA Pathological fracture, other site, initial encounter for fracture: Secondary | ICD-10-CM | POA: Diagnosis present

## 2015-04-16 DIAGNOSIS — G8929 Other chronic pain: Secondary | ICD-10-CM | POA: Diagnosis present

## 2015-04-16 DIAGNOSIS — E871 Hypo-osmolality and hyponatremia: Secondary | ICD-10-CM | POA: Diagnosis present

## 2015-04-16 DIAGNOSIS — Z85818 Personal history of malignant neoplasm of other sites of lip, oral cavity, and pharynx: Secondary | ICD-10-CM

## 2015-04-16 DIAGNOSIS — M8458XA Pathological fracture in neoplastic disease, other specified site, initial encounter for fracture: Secondary | ICD-10-CM | POA: Diagnosis not present

## 2015-04-16 DIAGNOSIS — M899 Disorder of bone, unspecified: Secondary | ICD-10-CM | POA: Diagnosis not present

## 2015-04-16 DIAGNOSIS — S22080A Wedge compression fracture of T11-T12 vertebra, initial encounter for closed fracture: Secondary | ICD-10-CM | POA: Diagnosis not present

## 2015-04-16 DIAGNOSIS — I10 Essential (primary) hypertension: Secondary | ICD-10-CM | POA: Diagnosis present

## 2015-04-16 DIAGNOSIS — Z8249 Family history of ischemic heart disease and other diseases of the circulatory system: Secondary | ICD-10-CM

## 2015-04-16 DIAGNOSIS — C099 Malignant neoplasm of tonsil, unspecified: Secondary | ICD-10-CM | POA: Diagnosis not present

## 2015-04-16 DIAGNOSIS — I129 Hypertensive chronic kidney disease with stage 1 through stage 4 chronic kidney disease, or unspecified chronic kidney disease: Secondary | ICD-10-CM | POA: Diagnosis present

## 2015-04-16 DIAGNOSIS — M4850XD Collapsed vertebra, not elsewhere classified, site unspecified, subsequent encounter for fracture with routine healing: Secondary | ICD-10-CM | POA: Diagnosis not present

## 2015-04-16 DIAGNOSIS — M810 Age-related osteoporosis without current pathological fracture: Secondary | ICD-10-CM | POA: Diagnosis present

## 2015-04-16 DIAGNOSIS — C9000 Multiple myeloma not having achieved remission: Secondary | ICD-10-CM | POA: Diagnosis not present

## 2015-04-16 DIAGNOSIS — M4850XA Collapsed vertebra, not elsewhere classified, site unspecified, initial encounter for fracture: Secondary | ICD-10-CM | POA: Diagnosis present

## 2015-04-16 DIAGNOSIS — Z79891 Long term (current) use of opiate analgesic: Secondary | ICD-10-CM | POA: Diagnosis not present

## 2015-04-16 DIAGNOSIS — Z801 Family history of malignant neoplasm of trachea, bronchus and lung: Secondary | ICD-10-CM

## 2015-04-16 DIAGNOSIS — R200 Anesthesia of skin: Secondary | ICD-10-CM | POA: Diagnosis not present

## 2015-04-16 DIAGNOSIS — E782 Mixed hyperlipidemia: Secondary | ICD-10-CM | POA: Diagnosis present

## 2015-04-16 DIAGNOSIS — M4806 Spinal stenosis, lumbar region: Secondary | ICD-10-CM | POA: Diagnosis not present

## 2015-04-16 DIAGNOSIS — E876 Hypokalemia: Secondary | ICD-10-CM | POA: Diagnosis not present

## 2015-04-16 DIAGNOSIS — M62838 Other muscle spasm: Secondary | ICD-10-CM | POA: Diagnosis present

## 2015-04-16 DIAGNOSIS — C9001 Multiple myeloma in remission: Secondary | ICD-10-CM | POA: Diagnosis present

## 2015-04-16 DIAGNOSIS — Z886 Allergy status to analgesic agent status: Secondary | ICD-10-CM

## 2015-04-16 DIAGNOSIS — Z923 Personal history of irradiation: Secondary | ICD-10-CM | POA: Diagnosis not present

## 2015-04-16 DIAGNOSIS — K5900 Constipation, unspecified: Secondary | ICD-10-CM | POA: Diagnosis present

## 2015-04-16 DIAGNOSIS — Q244 Congenital subaortic stenosis: Secondary | ICD-10-CM | POA: Diagnosis not present

## 2015-04-16 DIAGNOSIS — S22089A Unspecified fracture of T11-T12 vertebra, initial encounter for closed fracture: Secondary | ICD-10-CM | POA: Diagnosis not present

## 2015-04-16 LAB — COMPREHENSIVE METABOLIC PANEL
ALBUMIN: 4 g/dL (ref 3.5–5.2)
ALK PHOS: 128 U/L — AB (ref 39–117)
ALT: 51 U/L (ref 0–53)
ANION GAP: 5 (ref 5–15)
AST: 30 U/L (ref 0–37)
BUN: 14 mg/dL (ref 6–23)
CO2: 20 mmol/L (ref 19–32)
Calcium: 10.1 mg/dL (ref 8.4–10.5)
Chloride: 109 mmol/L (ref 96–112)
Creatinine, Ser: 1.53 mg/dL — ABNORMAL HIGH (ref 0.50–1.35)
GFR calc Af Amer: 53 mL/min — ABNORMAL LOW (ref >90)
GFR calc non Af Amer: 46 mL/min — ABNORMAL LOW (ref >90)
Glucose, Bld: 119 mg/dL — ABNORMAL HIGH (ref 70–99)
POTASSIUM: 3 mmol/L — AB (ref 3.5–5.1)
SODIUM: 134 mmol/L — AB (ref 135–145)
Total Bilirubin: 0.6 mg/dL (ref 0.3–1.2)
Total Protein: 7.8 g/dL (ref 6.0–8.3)

## 2015-04-16 LAB — CBC WITH DIFFERENTIAL/PLATELET
BASOS ABS: 0 10*3/uL (ref 0.0–0.1)
BASOS PCT: 0 % (ref 0–1)
EOS PCT: 0 % (ref 0–5)
Eosinophils Absolute: 0 10*3/uL (ref 0.0–0.7)
HCT: 39.1 % (ref 39.0–52.0)
Hemoglobin: 13.3 g/dL (ref 13.0–17.0)
Lymphocytes Relative: 11 % — ABNORMAL LOW (ref 12–46)
Lymphs Abs: 1 10*3/uL (ref 0.7–4.0)
MCH: 33.2 pg (ref 26.0–34.0)
MCHC: 34 g/dL (ref 30.0–36.0)
MCV: 97.5 fL (ref 78.0–100.0)
Monocytes Absolute: 1 10*3/uL (ref 0.1–1.0)
Monocytes Relative: 11 % (ref 3–12)
NEUTROS ABS: 7.1 10*3/uL (ref 1.7–7.7)
Neutrophils Relative %: 78 % — ABNORMAL HIGH (ref 43–77)
Platelets: 225 10*3/uL (ref 150–400)
RBC: 4.01 MIL/uL — ABNORMAL LOW (ref 4.22–5.81)
RDW: 12.8 % (ref 11.5–15.5)
WBC: 9.1 10*3/uL (ref 4.0–10.5)

## 2015-04-16 LAB — URINALYSIS, ROUTINE W REFLEX MICROSCOPIC
BILIRUBIN URINE: NEGATIVE
Glucose, UA: 500 mg/dL — AB
KETONES UR: NEGATIVE mg/dL
Leukocytes, UA: NEGATIVE
Nitrite: NEGATIVE
PROTEIN: 100 mg/dL — AB
SPECIFIC GRAVITY, URINE: 1.02 (ref 1.005–1.030)
UROBILINOGEN UA: 0.2 mg/dL (ref 0.0–1.0)
pH: 7 (ref 5.0–8.0)

## 2015-04-16 LAB — URINE MICROSCOPIC-ADD ON

## 2015-04-16 MED ORDER — HYDROMORPHONE HCL 1 MG/ML IJ SOLN
1.0000 mg | Freq: Once | INTRAMUSCULAR | Status: AC
  Administered 2015-04-16: 1 mg via INTRAVENOUS
  Filled 2015-04-16: qty 1

## 2015-04-16 MED ORDER — DIAZEPAM 5 MG/ML IJ SOLN
2.5000 mg | Freq: Once | INTRAMUSCULAR | Status: AC
  Administered 2015-04-16: 2.5 mg via INTRAVENOUS
  Filled 2015-04-16: qty 2

## 2015-04-16 MED ORDER — CHLORHEXIDINE GLUCONATE 0.12 % MT SOLN
15.0000 mL | Freq: Two times a day (BID) | OROMUCOSAL | Status: DC
  Administered 2015-04-17 – 2015-04-22 (×9): 15 mL via OROMUCOSAL
  Filled 2015-04-16 (×16): qty 15

## 2015-04-16 MED ORDER — ONDANSETRON HCL 4 MG/2ML IJ SOLN
4.0000 mg | Freq: Once | INTRAMUSCULAR | Status: AC
Start: 2015-04-16 — End: 2015-04-16
  Administered 2015-04-16: 4 mg via INTRAVENOUS
  Filled 2015-04-16: qty 2

## 2015-04-16 MED ORDER — FAMOTIDINE 20 MG PO TABS
20.0000 mg | ORAL_TABLET | Freq: Every day | ORAL | Status: DC
  Administered 2015-04-16 – 2015-04-23 (×8): 20 mg via ORAL
  Filled 2015-04-16 (×8): qty 1

## 2015-04-16 MED ORDER — DIAZEPAM 5 MG/ML IJ SOLN
2.5000 mg | Freq: Once | INTRAMUSCULAR | Status: AC
Start: 2015-04-16 — End: 2015-04-16
  Administered 2015-04-16: 2.5 mg via INTRAVENOUS
  Filled 2015-04-16: qty 2

## 2015-04-16 MED ORDER — IRBESARTAN 75 MG PO TABS
75.0000 mg | ORAL_TABLET | Freq: Every day | ORAL | Status: DC
  Administered 2015-04-16 – 2015-04-19 (×4): 75 mg via ORAL
  Filled 2015-04-16 (×5): qty 1

## 2015-04-16 MED ORDER — POTASSIUM CHLORIDE 20 MEQ/15ML (10%) PO SOLN
40.0000 meq | Freq: Once | ORAL | Status: AC
  Administered 2015-04-16: 40 meq via ORAL
  Filled 2015-04-16: qty 30

## 2015-04-16 MED ORDER — HEPARIN SODIUM (PORCINE) 5000 UNIT/ML IJ SOLN
5000.0000 [IU] | Freq: Three times a day (TID) | INTRAMUSCULAR | Status: DC
Start: 2015-04-16 — End: 2015-04-17
  Administered 2015-04-16 – 2015-04-17 (×3): 5000 [IU] via SUBCUTANEOUS
  Filled 2015-04-16 (×6): qty 1

## 2015-04-16 MED ORDER — HYDROMORPHONE HCL 1 MG/ML IJ SOLN
1.0000 mg | INTRAMUSCULAR | Status: DC | PRN
  Administered 2015-04-16 – 2015-04-18 (×12): 1 mg via INTRAVENOUS
  Filled 2015-04-16 (×12): qty 1

## 2015-04-16 MED ORDER — ACYCLOVIR 400 MG PO TABS
400.0000 mg | ORAL_TABLET | Freq: Two times a day (BID) | ORAL | Status: DC
  Administered 2015-04-16 – 2015-04-23 (×14): 400 mg via ORAL
  Filled 2015-04-16 (×17): qty 1

## 2015-04-16 MED ORDER — EZETIMIBE-SIMVASTATIN 10-40 MG PO TABS
1.0000 | ORAL_TABLET | Freq: Every day | ORAL | Status: DC
  Administered 2015-04-16 – 2015-04-22 (×7): 1 via ORAL
  Filled 2015-04-16 (×10): qty 1

## 2015-04-16 MED ORDER — DEXAMETHASONE SODIUM PHOSPHATE 10 MG/ML IJ SOLN
10.0000 mg | Freq: Four times a day (QID) | INTRAMUSCULAR | Status: DC
  Administered 2015-04-16 – 2015-04-17 (×3): 10 mg via INTRAVENOUS
  Filled 2015-04-16 (×7): qty 1

## 2015-04-16 MED ORDER — KETOROLAC TROMETHAMINE 30 MG/ML IJ SOLN
30.0000 mg | Freq: Once | INTRAMUSCULAR | Status: AC
  Administered 2015-04-16: 30 mg via INTRAVENOUS
  Filled 2015-04-16: qty 1

## 2015-04-16 MED ORDER — DEXAMETHASONE 6 MG PO TABS
40.0000 mg | ORAL_TABLET | ORAL | Status: DC
  Administered 2015-04-16: 40 mg via ORAL
  Filled 2015-04-16: qty 1

## 2015-04-16 MED ORDER — SENNOSIDES-DOCUSATE SODIUM 8.6-50 MG PO TABS
2.0000 | ORAL_TABLET | Freq: Two times a day (BID) | ORAL | Status: DC
  Administered 2015-04-16 – 2015-04-22 (×11): 2 via ORAL
  Filled 2015-04-16 (×17): qty 2

## 2015-04-16 MED ORDER — PSYLLIUM 95 % PO PACK
1.0000 | PACK | Freq: Two times a day (BID) | ORAL | Status: DC
  Administered 2015-04-16 – 2015-04-17 (×3): 1 via ORAL
  Filled 2015-04-16 (×4): qty 1

## 2015-04-16 MED ORDER — POTASSIUM CHLORIDE CRYS ER 20 MEQ PO TBCR
40.0000 meq | EXTENDED_RELEASE_TABLET | Freq: Every day | ORAL | Status: DC
  Administered 2015-04-16 – 2015-04-19 (×4): 40 meq via ORAL
  Filled 2015-04-16 (×5): qty 2

## 2015-04-16 MED ORDER — LORAZEPAM 2 MG/ML IJ SOLN: 2.0000 mg | Freq: Once | INTRAMUSCULAR | Status: DC

## 2015-04-16 MED ORDER — FENTANYL CITRATE (PF) 100 MCG/2ML IJ SOLN
50.0000 ug | Freq: Once | INTRAMUSCULAR | Status: AC
  Administered 2015-04-16: 50 ug via INTRAVENOUS
  Filled 2015-04-16: qty 2

## 2015-04-16 MED ORDER — CETYLPYRIDINIUM CHLORIDE 0.05 % MT LIQD
7.0000 mL | Freq: Two times a day (BID) | OROMUCOSAL | Status: DC
  Administered 2015-04-16 – 2015-04-23 (×14): 7 mL via OROMUCOSAL

## 2015-04-16 NOTE — H&P (Addendum)
[facility] History and Physical  Eric Dudley IWP:[id number] DOB: [birth year] DOA: 04/16/2015  Referring physician: Lahoma Rocker, ED PA PCP: [name], MD  Specialists: Dr. Truitt Merle Dr. 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  [place] Back pain with diffuse Osteoporosis  LBP started last Friday-really hit then.  S ignificant-dead center LB.  Shift from L-r side.  Spasm +/- with severe exacerbations overnight 4/18 Was Rxc with Vicodin Was told last pm to start taking 2 tabs  andd the Triage nurse and Dr. Burr Medico  That has not madwe a differecne to the patient's pain  Chemo started last thursday-Velcade  Chemo was supposed to be scheduled q Friday--lytic lesion  Bone scan-PET scheduled for tomorrow apparently--Lower back  In the ED patient was given Dilaudid and fentanyl-- CT abdomen pelvis = T12 compression # Bladder was distended 2+ reflexes-cannot stand up Nl sphincter tone       - Chest pain, - chills, - fever, - nausea, - vomiting, - blurred vision, - double vision, - unilateral weakness, - falls, - diarrhea + Constipation + Pain   Smoker cigars/used to Chew until 2006 No ETOH--only occasinal  FH- MO-Ovarian Ca Fa-Oat cell Ca lung  Sis-Black's disease  Social Used to be in [employer]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 Hypertension   . Dyslipidemia   . Tonsillar cancer 2006  . Cholelithiases    Past Surgical History  Procedure Laterality Date  . Appendectomy  1962  . Shoulder surgery  1999    left  . Knee surgery  2012    right   Social History:  History   Social History Narrative    Allergies  Allergen Reactions  . Hydrochlorothiazide Rash and Palpitations  . Asa [Aspirin] Nausea Only  . Hydrocodone  Itching and Other (See Comments)    Jittery, hyperactivity   . Amoxicillin Itching and Rash  . Indapamide Palpitations    Lightheadedness, dizziness    Family History  Problem Relation Age of Onset  . Cancer Mother 30    cervical cancer   . Cancer Father     unknown cancer   . Hypertension Sister   . Cancer Sister     melanoma     Prior to Admission medications   Medication Sig Start Date End Date Taking? Authorizing Provider  acetaminophen (TYLENOL) 500 MG tablet Take 1,000 mg by mouth 3 (three) times daily as needed for mild pain or moderate pain.   Yes Historical Provider, MD  acyclovir (ZOVIRAX) 400 MG tablet Take 1 tablet (400 mg total) by mouth 2 (two) times daily. 04/12/15  Yes Truitt Merle, MD  Bisacodyl (DULCOLAX PO) Take 3 capsules by mouth daily as needed (for constipation).    Yes Historical Provider, MD  dexamethasone (DECADRON) 4 MG tablet Take 10 tablets (40 mg total) by mouth once a week. Take 40 mg ( 10 tablets )  Once  A  Week  On  Day  Of  Velcade. 04/12/15  Yes Truitt Merle, MD  DIOVAN 80 MG tablet TAKE 1 TABLET DAILY 06/26/13  Yes Pixie Casino, MD  ezetimibe-simvastatin (VYTORIN) 10-40 MG per tablet Take 1 tablet by mouth at bedtime.   Yes Historical Provider, MD  HYDROcodone-acetaminophen (NORCO/VICODIN) 5-325 MG per  tablet Take 1 tablet by mouth every 6 (six) hours as needed for moderate pain. 04/12/15  Yes Truitt Merle, MD  lenalidomide (REVLIMID) 10 MG capsule Take 1 capsule (10 mg total) by mouth daily. Take for 21 days and rest 7 days. ICD-10 is C90.00. 04/05/15  Yes Truitt Merle, MD  Multiple Vitamins-Minerals (CENTRUM SILVER ULTRA MENS PO) Take 1 tablet by mouth daily.   Yes Historical Provider, MD  ondansetron (ZOFRAN) 8 MG tablet Take 1 tablet (8 mg total) by mouth 2 (two) times daily as needed for nausea or vomiting. 04/12/15  Yes Truitt Merle, MD  polyethylene glycol (MIRALAX / GLYCOLAX) packet Take 17 g by mouth daily.   Yes Historical Provider, MD  potassium chloride SA  (K-DUR,KLOR-CON) 20 MEQ tablet Take 1 tablet (20 mEq total) by mouth 2 (two) times daily. 04/12/15  Yes Truitt Merle, MD  [place]   Yes Historical Provider, MD  psyllium (METAMUCIL) 58.6 % powder Take 1 packet by mouth every other day.   Yes Historical Provider, MD  ranitidine (ZANTAC) 150 MG tablet Take 300 mg by mouth at bedtime.    Yes Historical Provider, MD  sennosides-docusate sodium (SENOKOT-S) 8.6-50 MG tablet Take 2 tablets by mouth 2 (two) times daily.   Yes Historical Provider, MD   Physical Exam: Filed Vitals:   04/16/15 0800 04/16/15 0900 04/16/15 0927 04/16/15 1158  BP: 122/98 113/72 117/59 120/64  Pulse: 46 60 65 57  Temp:   97.6 F (36.4 C) 97.5 F (36.4 C)  TempSrc:   Oral Oral  Resp:   18 18  Height:      Weight:      SpO2: 95% 95% 95% 97%    EOMI NCAT Thick neck, no JVD Clinically clear no added sounds S1-S2 no murmur rub or gallop Abdomen soft nontender Patient can raise legs above bed however this causes significant pain. Patient has 2/3 reflexes knees ankles and plantars are downgoing I did not assess patient's anal wink as it had been done by EDP   Labs on Admission:  Basic Metabolic Panel:  Recent Labs Lab 04/12/15 1114 04/16/15 0702  NA 138 134*  K 3.1* 3.0*  CL 111 109  CO2 22 20  GLUCOSE 120* 119*  BUN 10 14  CREATININE 1.54* 1.53*  CALCIUM 10.3 10.1   Liver Function Tests:  Recent Labs Lab 04/12/15 1114 04/16/15 0702  AST 31 30  ALT 48 51  ALKPHOS 171* 128*  BILITOT 0.6 0.6  PROT 9.7* 7.8  ALBUMIN 4.8 4.0   No results for input(s): LIPASE, AMYLASE in the last 168 hours. No results for input(s): AMMONIA in the last 168 hours. CBC:  Recent Labs Lab 04/12/15 0958 04/16/15 0702  WBC 7.5 9.1  NEUTROABS 5.8 7.1  HGB 14.9 13.3  HCT 42.3 39.1  MCV 96.1 97.5  PLT 293 225   Cardiac Enzymes: No results for input(s): CKTOTAL, CKMB, CKMBINDEX, TROPONINI in the last 168 hours.  BNP (last 3 results) No  results for input(s): BNP in the last 8760 hours.  ProBNP (last 3 results) No results for input(s): PROBNP in the last 8760 hours.  CBG: No results for input(s): GLUCAP in the last 168 hours.  Radiological Exams on Admission: Ct Abdomen Pelvis Wo Contrast  04/16/2015   CLINICAL DATA:  Bilateral back pain starting Friday, history of tonsillar cancer 10 years ago  EXAM: CT ABDOMEN AND PELVIS WITHOUT CONTRAST  TECHNIQUE: Multidetector CT imaging of the abdomen and pelvis  was performed following the standard protocol without IV contrast.  COMPARISON:  03/19/2015 bone survey  FINDINGS: Lung bases shows bilateral basilar posterior atelectasis. There is significant osteoporosis thoracolumbar spine.  There is interval moderate compression deformity of T12 vertebral body. Subacute compression fracture is suspected. Further correlation with MRI is recommended as clinically warranted.  Axial image 45 there is a lytic lesion L3 vertebral body measures about 1.4 cm. This is best seen on sagittal image 61. Metastatic disease cannot be excluded. Further correlation with MRI is recommended.  Unenhanced liver shows no biliary ductal dilatation. Small hiatal hernia.  Tiny calcified gallstones are noted within gallbladder the largest measures 3.8 mm. Unenhanced pancreas, spleen and adrenal glands are unremarkable. Unenhanced kidneys are symmetrical in size. No hydronephrosis or hydroureter. Nonobstructive calculus in lower pole of the right kidney measures 4 mm. There is a cortical calcification in lower pole of the left kidney measures 3 mm.  No calcified ureteral calculi are noted bilaterally. Atherosclerotic calcifications of abdominal aorta and iliac arteries.  No small bowel obstruction. Moderate stool noted in right colon and cecum. No pericecal inflammation. The terminal ileum is unremarkable. Moderate distended urinary bladder. Prostate gland and seminal vesicles are unremarkable. Diffuse osteoporosis noted within  pelvis and bilateral hips.  IMPRESSION: 1. Diffuse osteoporosis thoracolumbar spine. There is interval moderate compression deformity of T12 vertebral body. Subacute pathologic compression fracture cannot be excluded. There is a lytic lesion L3 vertebral body measures about 1.4 cm. Metastatic disease cannot be excluded. Further correlation with MRI is recommended. 2. Bilateral nonobstructive nephrolithiasis. No hydronephrosis or hydroureter. 3. No calcified ureteral calculi. 4. No pericecal inflammation.  Unremarkable terminal ileum. 5. Small layering gallstones are noted within gallbladder the largest measures 3.8 mm. 6. Moderate distended urinary bladder. No stones are noted within urinary bladder   Electronically Signed   By: Lahoma Crocker M.D.   On: 04/16/2015 10:39    EKG: Independently reviewed.    Assessment/Plan Principal Problem:   Acute back pain-potential fracture CT abdomen pelvis = ?? Mod comp deformity T12, ? Pathologic # ? Lytic lesion L3  MRI back stat ordered  I did speak to Dr. Lanell Persons about the patient and she will visit the patient if needed -consult appreciated Patient may need kyphoplasty if MRI comes back as being + for acute fracture MRI is pending   Pain control Dilaudid 1 mg every 2 hourly     when necessary diazepam I gave the patient Decadron every 8 hrs for right now until we can sort out whether this is a fracture or a metastases Called NS and left consult info for Neurosurgeon on call Dr. Trenton Gammon who is aware of patient and feels on CT over-read by him that T-12 area is NOT likely amenable to Kypho.  He can be available to see patient in consult if MRI is +  Multiple myeloma-defer Velcade,other medications to oncologist who will see the patient in consult-consult appreciated    HTN (hypertension) bradycardia--continue Diovan 80 daily   Hyperlipidemia-continue Vytorin daily   Hypokalemia-replace orally 40 daily   Tonsillar cancer-Outpatient follow-up   CKD (chronic  kidney disease), stage II-needs outpatient follow-up-hemodynamically stable     Time spent: 82 Inpatient Med-surg Long discussion with wife at bedside   Verlon Au [facility] Pager [phone]  If 7PM-7AM, please contact night-coverage [email or web address] Password [facility] 04/16/2015, 12:53 PM

## 2015-04-16 NOTE — Progress Notes (Signed)
UR complete 

## 2015-04-16 NOTE — ED Provider Notes (Signed)
CSN: [id number]     Arrival date & time 04/16/15  0556 History   First MD Initiated Contact with Patient 04/16/15 [phone]     Chief Complaint  Patient presents with  . Back Pain     (Consider location/radiation/quality/duration/timing/severity/associated sxs/prior Treatment) HPI Eric Dudley is a 64 y.o. male with hx of HTN, currently being treated for multiple myeloma, presents to ED with complaint of back pain. Pain has progressed over last several weeks. States it has worsened to unbearable in the last 4 days. He has been in contact with Dr. Burr Medico, his oncologist, who has been increasing his vicodin doses. Pt states it is not helping. He states pain is in the middle lower back. It does not radiate. It goes from one side to another. No numbness or weakness in extremities. No urinary symptoms. No incontinence of bowel or urine. No known lytic lesions in his back as far as he knows. Pt states his pain worsened few hours after receiving chemotherapy infusion. Pt has been followed by Dr. Burr Medico for this back pain. PET scan ordered for tomorrow since suspicious for lytic bone lesions although bone scan did not show any.   Past Medical History  Diagnosis Date  . Hypertension   . Dyslipidemia   . Tonsillar cancer 2006  . Cholelithiases    Past Surgical History  Procedure Laterality Date  . Appendectomy  1962  . Shoulder surgery  1999    left  . Knee surgery  2012    right   Family History  Problem Relation Age of Onset  . Cancer Mother 29    cervical cancer   . Cancer Father     unknown cancer   . Hypertension Sister   . Cancer Sister     melanoma    History  Substance Use Topics  . Smoking status: Former Smoker    Types: Cigars    Quit date: 12/29/2003  . Smokeless tobacco: Former Systems developer    Types: Chew    Quit date: 12/28/1994  . Alcohol Use: Yes     Comment: occasional beer    Review of Systems  Constitutional: Negative for fever and chills.  Respiratory: Negative for  cough, chest tightness and shortness of breath.   Cardiovascular: Negative for chest pain, palpitations and leg swelling.  Gastrointestinal: Negative for nausea, vomiting, abdominal pain, diarrhea and abdominal distention.  Genitourinary: Negative for dysuria, urgency, frequency, hematuria and difficulty urinating.  Musculoskeletal: Positive for back pain and arthralgias. Negative for myalgias, neck pain and neck stiffness.  Skin: Negative for rash.  Neurological: Negative for dizziness, weakness, light-headedness, numbness and headaches.  All other systems reviewed and are negative.     Allergies  Hydrochlorothiazide; Asa; Hydrocodone; Amoxicillin; and Indapamide  Home Medications   Prior to Admission medications   Medication Sig Start Date End Date Taking? Authorizing Provider  acetaminophen (TYLENOL) 500 MG tablet Take 1,000 mg by mouth 3 (three) times daily as needed for mild pain or moderate pain.    Historical Provider, MD  acyclovir (ZOVIRAX) 400 MG tablet Take 1 tablet (400 mg total) by mouth 2 (two) times daily. 04/12/15   Truitt Merle, MD  Bisacodyl (DULCOLAX PO) Take 3 capsules by mouth daily.    Historical Provider, MD  dexamethasone (DECADRON) 4 MG tablet Take 10 tablets (40 mg total) by mouth once a week. Take 40 mg ( 10 tablets )  Once  A  Week  On  Day  Of  Velcade. 04/12/15  Truitt Merle, MD  DIOVAN 80 MG tablet TAKE 1 TABLET DAILY 06/26/13   Pixie Casino, MD  ezetimibe-simvastatin (VYTORIN) 10-40 MG per tablet Take 1 tablet by mouth at bedtime.    Historical Provider, MD  HYDROcodone-acetaminophen (NORCO/VICODIN) 5-325 MG per tablet Take 1 tablet by mouth every 6 (six) hours as needed for moderate pain. 04/12/15   Truitt Merle, MD  lenalidomide (REVLIMID) 10 MG capsule Take 1 capsule (10 mg total) by mouth daily. Take for 21 days and rest 7 days. ICD-10 is C90.00. 04/05/15   Truitt Merle, MD  Multiple Vitamins-Minerals (CENTRUM SILVER ULTRA MENS PO) Take 1 tablet by mouth daily.     Historical Provider, MD  ondansetron (ZOFRAN) 8 MG tablet Take 1 tablet (8 mg total) by mouth 2 (two) times daily as needed for nausea or vomiting. 04/12/15   Truitt Merle, MD  potassium chloride SA (K-DUR,KLOR-CON) 20 MEQ tablet Take 1 tablet (20 mEq total) by mouth 2 (two) times daily. 04/12/15   Truitt Merle, MD  psyllium (METAMUCIL) 58.6 % powder Take 1 packet by mouth every other day.    Historical Provider, MD  ranitidine (ZANTAC) 150 MG tablet Take 300 mg by mouth at bedtime.     Historical Provider, MD  traMADol (ULTRAM) 50 MG tablet Take 50-100 mg by mouth 3 (three) times daily as needed for moderate pain.    Historical Provider, MD   BP 117/58 mmHg  Pulse 60  Temp(Src) 97.3 F (36.3 C) (Oral)  Resp 18  Ht '5\' 8"'  (1.727 m)  Wt 202 lb (91.627 kg)  BMI 30.72 kg/m2  SpO2 97% Physical Exam  Constitutional: He is oriented to person, place, and time. He appears well-developed and well-nourished.  Appears to be in a lot of pain  HENT:  Head: Normocephalic and atraumatic.  Eyes: Conjunctivae are normal.  Neck: Neck supple.  Cardiovascular: Normal rate, regular rhythm and normal heart sounds.   Pulmonary/Chest: Effort normal. No respiratory distress. He has no wheezes. He has no rales.  Abdominal: Soft. Bowel sounds are normal. He exhibits no distension. There is no tenderness. There is no rebound.  Musculoskeletal: He exhibits no edema.  Normal appearing back, no ttp over midline lumbar spine or perispinal muscles. No pain with straight leg raise bilaterallly  Neurological: He is alert and oriented to person, place, and time.  5/5 and equal lower extremity strength. 2+ and equal patellar reflexes bilaterally. Pt able to dorsiflex bilateral toes and feet with good strength against resistance. Equal sensation bilaterally over thighs and lower legs.   Skin: Skin is warm and dry.  Nursing note and vitals reviewed.   ED Course  Procedures (including critical care time) Labs Review Labs  Reviewed  CBC WITH DIFFERENTIAL/PLATELET - Abnormal; Notable for the following:    RBC 4.01 (*)    Neutrophils Relative % 78 (*)    Lymphocytes Relative 11 (*)    All other components within normal limits  COMPREHENSIVE METABOLIC PANEL - Abnormal; Notable for the following:    Sodium 134 (*)    Potassium 3.0 (*)    Glucose, Bld 119 (*)    Creatinine, Ser 1.53 (*)    Alkaline Phosphatase 128 (*)    GFR calc non Af Amer 46 (*)    GFR calc Af Amer 53 (*)    All other components within normal limits  URINALYSIS, ROUTINE W REFLEX MICROSCOPIC    Imaging Review No results found.   EKG Interpretation None  MDM   Final diagnoses:  Back pain  T12 compression fracture, initial encounter    Pt with lower back pain, neurologically intact. No signs of cauda equina. Pain is 10/10. Will get labs, dilaudid IV and valium iv ordered. Pt already received 2 doses of fentanyl which he states initially helped  But wore off.   11:18 AM Pt's ct scan showing comprssion fracture at T12, also possible letic lesion in L3. Pt having trouble urinating, distended bladder on CT. Rectal exam showed normal rectal tone. Will place a foley.   12:36 PM MRI ordered. Discussed with [facility], advised to start decadron. Consult Dr. Burr Medico.   Spoke with Dr. Burr Medico, who will see pt in the hospital today and will assist with management.        Jeannett Senior, PA-C 04/16/15 [place], MD 04/19/15 [phone]

## 2015-04-16 NOTE — Telephone Encounter (Signed)
Called and left message with new appointments per pof  anne

## 2015-04-16 NOTE — ED Notes (Signed)
Patient presents from home via EMS for lower lumbar pain x3 days, increased with movement and palpation, spasms, 10/10 pain.  1100mcg fentanyl, 20g left forearm, 870ml NS in route.  VS: 105/60, 61HR, 95% RA, 16Resp.

## 2015-04-16 NOTE — ED Notes (Signed)
Pt. Is unable to use the restroom at this time, but is aware that we need a urine specimen. Urinal at bedside. 

## 2015-04-16 NOTE — ED Notes (Signed)
Bed: WA17 Expected date:  Expected time:  Means of arrival:  Comments: EMS 64 yo male with low back pain/CA patient/muscle spasms

## 2015-04-16 NOTE — Progress Notes (Signed)
Patient has Revalmid.  Floor nurse needs to take it to the Pharmacy upon arrival to the floor

## 2015-04-16 NOTE — Progress Notes (Signed)
Eric Dudley   DOB:February 07, 1951   XB#:[id number]   ZHY#:[id number]  Subjective: patient was admitted from ED this morning for worsening back pain, and a CT finding of compression fracture. He just received IV Valium and was quite drowsy before I saw him on the floor, to prepare for the MRI. I spoke with his wife in the room.    Objective:  Filed Vitals:   04/16/15 1347  BP: 144/68  Pulse: 63  Temp: 97.8 F (36.6 C)  Resp: 18    Body mass index is 30.72 kg/(m^2).  Intake/Output Summary (Last 24 hours) at 04/16/15 1813 Last data filed at 04/16/15 1134  Gross per 24 hour  Intake      0 ml  Output   1370 ml  Net  -1370 ml     Sclerae unicteric  Oropharynx clear  No peripheral adenopathy  Lungs clear -- no rales or rhonchi  Heart regular rate and rhythm  Abdomen benign  MSK no focal spinal tenderness, no peripheral edema  Neuro nonfocal   CBG (last 3)  No results for input(s): GLUCAP in the last 72 hours.   Labs:  Lab Results  Component Value Date   WBC 9.1 04/16/2015   HGB 13.3 04/16/2015   HCT 39.1 04/16/2015   MCV 97.5 04/16/2015   PLT 225 04/16/2015   NEUTROABS 7.1 04/16/2015    _0 @  Urine Studies No results for input(s): UHGB, CRYS in the last 72 hours.  Invalid input(s): UACOL, UAPR, USPG, UPH, UTP, UGL, UKET, UBIL, UNIT, UROB, ULEU, UEPI, UWBC, URBC, UBAC, CAST, UCOM, BILUA  Basic Metabolic Panel:  Recent Labs Lab 04/12/15 1114 04/16/15 0702  NA 138 134*  K 3.1* 3.0*  CL 111 109  CO2 22 20  GLUCOSE 120* 119*  BUN 10 14  CREATININE 1.54* 1.53*  CALCIUM 10.3 10.1   GFR Estimated Creatinine Clearance: 52.9 mL/min (by C-G formula based on Cr of 1.53). Liver Function Tests:  Recent Labs Lab 04/12/15 1114 04/16/15 0702  AST 31 30  ALT 48 51  ALKPHOS 171* 128*  BILITOT 0.6 0.6  PROT 9.7* 7.8  ALBUMIN 4.8 4.0   No results for input(s): LIPASE, AMYLASE in the last 168 hours. No results for input(s): AMMONIA in the last 168  hours. Coagulation profile No results for input(s): INR, PROTIME in the last 168 hours.  CBC:  Recent Labs Lab 04/12/15 0958 04/16/15 0702  WBC 7.5 9.1  NEUTROABS 5.8 7.1  HGB 14.9 13.3  HCT 42.3 39.1  MCV 96.1 97.5  PLT 293 225   Cardiac Enzymes: No results for input(s): CKTOTAL, CKMB, CKMBINDEX, TROPONINI in the last 168 hours. BNP: Invalid input(s): POCBNP CBG: No results for input(s): GLUCAP in the last 168 hours. D-Dimer No results for input(s): DDIMER in the last 72 hours. Hgb A1c No results for input(s): HGBA1C in the last 72 hours. Lipid Profile No results for input(s): CHOL, HDL, LDLCALC, TRIG, CHOLHDL, LDLDIRECT in the last 72 hours. Thyroid function studies No results for input(s): TSH, T4TOTAL, T3FREE, THYROIDAB in the last 72 hours.  Invalid input(s): FREET3 Anemia work up No results for input(s): VITAMINB12, FOLATE, FERRITIN, TIBC, IRON, RETICCTPCT in the last 72 hours. Microbiology No results found for this or any previous visit (from the past 240 hour(s)).    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  COMPARISON:  03/19/2015 bone survey  FINDINGS: Lung bases shows bilateral basilar posterior atelectasis. There is significant osteoporosis thoracolumbar spine.  There is interval moderate compression deformity of T12 vertebral body. Subacute compression fracture is suspected. Further correlation with MRI is recommended as clinically warranted.  Axial image 45 there is a lytic lesion L3 vertebral body measures about 1.4 cm. This is best seen on sagittal image 61. Metastatic disease cannot be excluded. Further correlation with MRI is recommended.  Unenhanced liver shows no biliary ductal  dilatation. Small hiatal hernia.  Tiny calcified gallstones are noted within gallbladder the largest measures 3.8 mm. Unenhanced pancreas, spleen and adrenal glands are unremarkable. Unenhanced kidneys are symmetrical in size. No hydronephrosis or hydroureter. Nonobstructive calculus in lower pole of the right kidney measures 4 mm. There is a cortical calcification in lower pole of the left kidney measures 3 mm.  No calcified ureteral calculi are noted bilaterally. Atherosclerotic calcifications of abdominal aorta and iliac arteries.  No small bowel obstruction. Moderate stool noted in right colon and cecum. No pericecal inflammation. The terminal ileum is unremarkable. Moderate distended urinary bladder. Prostate gland and seminal vesicles are unremarkable. Diffuse osteoporosis noted within pelvis and bilateral hips.  IMPRESSION: 1. Diffuse osteoporosis thoracolumbar spine. There is interval moderate compression deformity of T12 vertebral body. Subacute pathologic compression fracture cannot be excluded. There is a lytic lesion L3 vertebral body measures about 1.4 cm. Metastatic disease cannot be excluded. Further correlation with MRI is recommended. 2. Bilateral nonobstructive nephrolithiasis. No hydronephrosis or hydroureter. 3. No calcified ureteral calculi. 4. No pericecal inflammation.  Unremarkable terminal ileum. 5. Small layering gallstones are noted within gallbladder the largest measures 3.8 mm. 6. Moderate distended urinary bladder. No stones are noted within urinary bladder   Electronically Signed   By: Lahoma Crocker M.D.   On: 04/16/2015 10:39    Assessment: 64 y.o. with newly diagnosed multiple myeloma, just started chemotherapy last week. Presented with worsening back pain.  1. Multiple myeloma, on chemotherapy 2. Probable T12 compression fracture 3. severe back pain secondary to #2  Plan: -wait for the spinal MRI tonight -Appreciated consulting neurosurgery and rad/onc -I'll hold Revlimid  and Velcade during his hospital stay -We'll cancel his PET scan tomorrow -consider oxycodone 5-58m every 4 hrs for his back pain, and add oxycontin in 2-3 days if needed (pain not well controlled or needing oxycodone more than 374mdaily). OK to give iv pain meds if needed today or tomorrow  -watch for constipation, stool softness and laxatives as needed  I'll follow him when he is in the hospital.   FeTruitt MerleMD 04/16/2015  6:13 PM

## 2015-04-16 NOTE — ED Provider Notes (Signed)
Pt seen and evaluated.  Severe pain.  Ct with T12compression fx,and posible met toL3.  Urinary retention, but no Cauda Equina by exam.  Agree with admit, consultations. Given Decadron per hospitalist.  Tanna Furry, MD 04/16/15 1224

## 2015-04-17 ENCOUNTER — Ambulatory Visit
Admit: 2015-04-17 | Discharge: 2015-04-17 | Disposition: A | Discharge status: Home / Self Care | Payer: Medicare Other | Attending: Radiation Oncology | Admitting: Radiation Oncology

## 2015-04-17 ENCOUNTER — Ambulatory Visit (HOSPITAL_COMMUNITY): Payer: Medicare Other

## 2015-04-17 ENCOUNTER — Other Ambulatory Visit: Payer: Self-pay | Admitting: *Deleted

## 2015-04-17 DIAGNOSIS — N182 Chronic kidney disease, stage 2 (mild): Secondary | ICD-10-CM

## 2015-04-17 DIAGNOSIS — M546 Pain in thoracic spine: Secondary | ICD-10-CM | POA: Insufficient documentation

## 2015-04-17 DIAGNOSIS — I10 Essential (primary) hypertension: Secondary | ICD-10-CM

## 2015-04-17 DIAGNOSIS — S22080A Wedge compression fracture of T11-T12 vertebra, initial encounter for closed fracture: Secondary | ICD-10-CM | POA: Insufficient documentation

## 2015-04-17 DIAGNOSIS — M4850XA Collapsed vertebra, not elsewhere classified, site unspecified, initial encounter for fracture: Secondary | ICD-10-CM | POA: Diagnosis present

## 2015-04-17 DIAGNOSIS — M4850XD Collapsed vertebra, not elsewhere classified, site unspecified, subsequent encounter for fracture with routine healing: Secondary | ICD-10-CM

## 2015-04-17 DIAGNOSIS — C9000 Multiple myeloma not having achieved remission: Secondary | ICD-10-CM

## 2015-04-17 LAB — COMPREHENSIVE METABOLIC PANEL
ALBUMIN: 4.2 g/dL (ref 3.5–5.2)
ALK PHOS: 147 U/L — AB (ref 39–117)
ALT: 48 U/L (ref 0–53)
AST: 36 U/L (ref 0–37)
Anion gap: 6 (ref 5–15)
BILIRUBIN TOTAL: 0.7 mg/dL (ref 0.3–1.2)
BUN: 15 mg/dL (ref 6–23)
CHLORIDE: 109 mmol/L (ref 96–112)
CO2: 23 mmol/L (ref 19–32)
Calcium: 10.5 mg/dL (ref 8.4–10.5)
Creatinine, Ser: 1.46 mg/dL — ABNORMAL HIGH (ref 0.50–1.35)
GFR calc Af Amer: 56 mL/min — ABNORMAL LOW (ref >90)
GFR calc non Af Amer: 49 mL/min — ABNORMAL LOW (ref >90)
Glucose, Bld: 143 mg/dL — ABNORMAL HIGH (ref 70–99)
POTASSIUM: 3.7 mmol/L (ref 3.5–5.1)
Sodium: 138 mmol/L (ref 135–145)
Total Protein: 8.8 g/dL — ABNORMAL HIGH (ref 6.0–8.3)

## 2015-04-17 LAB — CBC
HCT: 44 % (ref 39.0–52.0)
Hemoglobin: 14.6 g/dL (ref 13.0–17.0)
MCH: 33.2 pg (ref 26.0–34.0)
MCHC: 33.2 g/dL (ref 30.0–36.0)
MCV: 100 fL (ref 78.0–100.0)
PLATELETS: 263 10*3/uL (ref 150–400)
RBC: 4.4 MIL/uL (ref 4.22–5.81)
RDW: 13 % (ref 11.5–15.5)
WBC: 8 10*3/uL (ref 4.0–10.5)

## 2015-04-17 LAB — PROTIME-INR
INR: 1.01 (ref 0.00–1.49)
Prothrombin Time: 13.4 seconds (ref 11.6–15.2)

## 2015-04-17 MED ORDER — OXYCODONE HCL 5 MG PO TABS
10.0000 mg | ORAL_TABLET | ORAL | Status: DC | PRN
  Administered 2015-04-20 – 2015-04-22 (×4): 10 mg via ORAL
  Filled 2015-04-17 (×4): qty 2

## 2015-04-17 MED ORDER — BISACODYL 10 MG RE SUPP: 10.0000 mg | Freq: Every day | RECTAL | Status: DC | PRN

## 2015-04-17 MED ORDER — ACETAMINOPHEN 325 MG PO TABS: 650.0000 mg | ORAL_TABLET | Freq: Four times a day (QID) | ORAL | Status: DC | PRN

## 2015-04-17 MED ORDER — VANCOMYCIN HCL IN DEXTROSE 1-5 GM/200ML-% IV SOLN
1000.0000 mg | INTRAVENOUS | Status: AC
  Administered 2015-04-18: 1000 mg via INTRAVENOUS
  Filled 2015-04-17 (×2): qty 200

## 2015-04-17 MED ORDER — POLYETHYLENE GLYCOL 3350 17 G PO PACK
17.0000 g | PACK | Freq: Two times a day (BID) | ORAL | Status: DC
  Administered 2015-04-17 – 2015-04-22 (×8): 17 g via ORAL
  Filled 2015-04-17 (×14): qty 1

## 2015-04-17 MED ORDER — LENALIDOMIDE 10 MG PO CAPS
10.0000 mg | ORAL_CAPSULE | Freq: Every day | ORAL | Status: DC
  Administered 2015-04-19 – 2015-04-23 (×5): 10 mg via ORAL

## 2015-04-17 NOTE — Consult Note (Signed)
Reason for consult: Back pain, T12 compression fracture, need for vertebroplasty/kyphoplasty  Referring Physician(s): [facility]  History of Present Illness: Eric Dudley is a 64 y.o. male with history of osteoporosis, multiple myeloma and remote tonsillar cancer who presents to the hospital with acute on chronic back pain of approximately 3 weeks duration. Patient states that over the last few days the pain has become progressively worse despite medication use. He states that attempting to stand and walk worsens the pain in his mid back region. He denies bowel or bladder dysfunction or radiation of pain to the lower extremities, although he does experience some  Intermittent radiation of pain to the epigastric region in a bandlike fashion. He also denies any recent falls or trauma. Recent MRI of the thoracic and lumbar spine revealed a pathologic compression fracture at T12 as well as diffusely abnormal marrow consistent with history of myeloma. Request has now been received for vertebroplasty/kyphoplasty at T12.  Past Medical History  Diagnosis Date  . Hypertension   . Dyslipidemia   . Tonsillar cancer 2006  . Cholelithiases     Past Surgical History  Procedure Laterality Date  . Appendectomy  1962  . Shoulder surgery  1999    left  . Knee surgery  2012    right    Allergies: Hydrochlorothiazide; Asa; Hydrocodone; Amoxicillin; and Indapamide  Medications: Prior to Admission medications   Medication Sig Start Date End Date Taking? Authorizing Provider  acetaminophen (TYLENOL) 500 MG tablet Take 1,000 mg by mouth 3 (three) times daily as needed for mild pain or moderate pain.   Yes Historical Provider, MD  acyclovir (ZOVIRAX) 400 MG tablet Take 1 tablet (400 mg total) by mouth 2 (two) times daily. 04/12/15  Yes Truitt Merle, MD  Bisacodyl (DULCOLAX PO) Take 3 capsules by mouth daily as needed (for constipation).    Yes Historical Provider, MD  dexamethasone (DECADRON) 4 MG tablet  Take 10 tablets (40 mg total) by mouth once a week. Take 40 mg ( 10 tablets )  Once  A  Week  On  Day  Of  Velcade. 04/12/15  Yes Truitt Merle, MD  DIOVAN 80 MG tablet TAKE 1 TABLET DAILY 06/26/13  Yes Pixie Casino, MD  ezetimibe-simvastatin (VYTORIN) 10-40 MG per tablet Take 1 tablet by mouth at bedtime.   Yes Historical Provider, MD  HYDROcodone-acetaminophen (NORCO/VICODIN) 5-325 MG per tablet Take 1 tablet by mouth every 6 (six) hours as needed for moderate pain. 04/12/15  Yes Truitt Merle, MD  lenalidomide (REVLIMID) 10 MG capsule Take 1 capsule (10 mg total) by mouth daily. Take for 21 days and rest 7 days. ICD-10 is C90.00. 04/05/15  Yes Truitt Merle, MD  Multiple Vitamins-Minerals (CENTRUM SILVER ULTRA MENS PO) Take 1 tablet by mouth daily.   Yes Historical Provider, MD  ondansetron (ZOFRAN) 8 MG tablet Take 1 tablet (8 mg total) by mouth 2 (two) times daily as needed for nausea or vomiting. 04/12/15  Yes Truitt Merle, MD  polyethylene glycol (MIRALAX / GLYCOLAX) packet Take 17 g by mouth daily.   Yes Historical Provider, MD  potassium chloride SA (K-DUR,KLOR-CON) 20 MEQ tablet Take 1 tablet (20 mEq total) by mouth 2 (two) times daily. 04/12/15  Yes Truitt Merle, MD  [place]   Yes Historical Provider, MD  psyllium (METAMUCIL) 58.6 % powder Take 1 packet by mouth every other day.   Yes Historical Provider, MD  ranitidine (ZANTAC) 150 MG tablet Take 300  mg by mouth at bedtime.    Yes Historical Provider, MD  sennosides-docusate sodium (SENOKOT-S) 8.6-50 MG tablet Take 2 tablets by mouth 2 (two) times daily.   Yes Historical Provider, MD     Family History  Problem Relation Age of Onset  . Cancer Mother 43    cervical cancer   . Cancer Father     unknown cancer   . Hypertension Sister   . Cancer Sister     melanoma     History   Social History  . Marital Status: Married    Spouse Name: N/A  . Number of Children: N/A  . Years of Education: N/A   Social History Main Topics    . Smoking status: Former Smoker    Types: Cigars    Quit date: 12/29/2003  . Smokeless tobacco: Former Systems developer    Types: Chew    Quit date: 12/28/1994  . Alcohol Use: Yes     Comment: occasional beer  . Drug Use: No  . Sexual Activity: Not on file   Other Topics Concern  . None   Social History Narrative      Review of Systems see above  Vital Signs: BP 124/66 mmHg  Pulse 72  Temp(Src) 97 F (36.1 C) (Oral)  Resp 18  Ht '5\' 8"'  (1.727 m)  Wt 202 lb (91.627 kg)  BMI 30.72 kg/m2  SpO2 96%  Physical Exam patient awake, alert. Chest is clear to auscultation bilaterally. Heart with regular rate and rhythm. Abdomen soft, positive bowel sounds, nontender. Extremities with full range of motion and no edema, sensory function intact.  Moderate paravertebral tenderness noted at T12 level.  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  COMPARISON:  03/19/2015 bone survey  FINDINGS: Lung bases shows bilateral basilar posterior atelectasis. There is significant osteoporosis thoracolumbar spine.  There is interval moderate compression deformity of T12 vertebral body. Subacute compression fracture is suspected. Further correlation with MRI is recommended as clinically warranted.  Axial image 45 there is a lytic lesion L3 vertebral body measures about 1.4 cm. This is best seen on sagittal image 61. Metastatic disease cannot be excluded. Further correlation with MRI is recommended.  Unenhanced liver shows no biliary ductal dilatation. Small hiatal hernia.  Tiny calcified gallstones are noted within gallbladder the largest measures 3.8 mm. Unenhanced pancreas, spleen and adrenal glands are unremarkable.  Unenhanced kidneys are symmetrical in size. No hydronephrosis or hydroureter. Nonobstructive calculus in lower pole of the right kidney measures 4 mm. There is a cortical calcification in lower pole of the left kidney measures 3 mm.  No calcified ureteral calculi are noted bilaterally. Atherosclerotic calcifications of abdominal aorta and iliac arteries.  No small bowel obstruction. Moderate stool noted in right colon and cecum. No pericecal inflammation. The terminal ileum is unremarkable. Moderate distended urinary bladder. Prostate gland and seminal vesicles are unremarkable. Diffuse osteoporosis noted within pelvis and bilateral hips.  IMPRESSION: 1. Diffuse osteoporosis thoracolumbar spine. There is interval moderate compression deformity of T12 vertebral body. Subacute pathologic compression fracture cannot be excluded. There is a lytic lesion L3 vertebral body measures about 1.4 cm. Metastatic disease cannot be excluded. Further correlation with MRI is recommended. 2. Bilateral  nonobstructive nephrolithiasis. No hydronephrosis or hydroureter. 3. No calcified ureteral calculi. 4. No pericecal inflammation.  Unremarkable terminal ileum. 5. Small layering gallstones are noted within gallbladder the largest measures 3.8 mm. 6. Moderate distended urinary bladder. No stones are noted within urinary bladder   Electronically Signed   By: Lahoma Crocker M.D.   On: 04/16/2015 10:39   Mr Thoracic Spine Wo Contrast  04/17/2015   ADDENDUM REPORT: 04/17/2015 09:01  ADDENDUM: The MRI thoracic spine Impression should read: Pathologic compression fracture at T12 in this patient with widespread multiple myeloma, not T8. Findings discussed with Dr. Isidore Moos.   Electronically Signed   By: Rolla Flatten M.D.   On: 04/17/2015 09:01   04/17/2015   CLINICAL DATA:  Patient with multiple myeloma and acute back pain. Symptoms for several weeks, much worse in the past 4 days. Initial encounter.  EXAM: MRI THORACIC AND LUMBAR SPINE WITHOUT  CONTRAST  TECHNIQUE: Multiplanar and multiecho pulse sequences of the thoracic and lumbar spine were obtained without intravenous contrast.  COMPARISON:  CT urogram earlier today.  FINDINGS: MR THORACIC SPINE FINDINGS  Numbering of the thoracic spine was accomplished by counting down from the odontoid. New  Diffuse marrow signal abnormality consistent with myeloma. Minor endplate softening superiorly and inferiorly at T9. There is an acute to subacute compression fracture of T12 vertebral body with moderate BILATERAL endplate can cavity and loss of height estimated 30-50%. Retropulsion of abnormal bone at T12 without conus compression. No other pathologic compression deformities. Focal myeloma deposit midline T9 posteriorly measuring 10 x 10 x 20 mm. No significant mass effect on the spinal cord.  No significant thoracic disc protrusion or spinal stenosis. Mild annular bulging T7-T8. No cord compression at any level. Small layering BILATERAL effusions. Bibasilar atelectasis.  MR LUMBAR SPINE FINDINGS  Diffusely abnormal marrow consistent with the history of myeloma. Focal deposit posterior aspect of L3 on the RIGHT measures 19 x 13 x 15 mm. No pathologic compression deformity. Mild stenosis at L4-5 relates to ordinary spondylosis with annular bulging and facet arthropathy. Focal rightward protrusion at this level narrows the foramen, potentially affecting the L4 nerve root. No obstructive uropathy.  IMPRESSION: MR THORACIC SPINE IMPRESSION  Pathologic compression fracture at T8 in this patient with widespread multiple myeloma. Slight retropulsion of abnormal bone without conus compression.  MR LUMBAR SPINE IMPRESSION  Focal myeloma deposits at L3 and T9 without significant compression deformity.  Mild stenosis at L4-5 related to ordinary spondylosis.  Electronically Signed: By: Rolla Flatten M.D. On: 04/16/2015 20:14   Mr Lumbar Spine Wo Contrast  04/17/2015   ADDENDUM REPORT: 04/17/2015 09:01  ADDENDUM: The MRI  thoracic spine Impression should read: Pathologic compression fracture at T12 in this patient with widespread multiple myeloma, not T8. Findings discussed with Dr. Isidore Moos.   Electronically Signed   By: Rolla Flatten M.D.   On: 04/17/2015 09:01   04/17/2015   CLINICAL DATA:  Patient with multiple myeloma and acute back pain. Symptoms for several weeks, much worse in the past 4 days. Initial encounter.  EXAM: MRI THORACIC AND LUMBAR SPINE WITHOUT CONTRAST  TECHNIQUE: Multiplanar and multiecho pulse sequences of the thoracic and lumbar spine were obtained without intravenous contrast.  COMPARISON:  CT urogram earlier today.  FINDINGS: MR THORACIC SPINE FINDINGS  Numbering of the thoracic spine was accomplished by counting down from the odontoid. New  Diffuse marrow signal abnormality consistent with myeloma. Minor endplate softening superiorly and inferiorly at T9. There is an acute to  subacute compression fracture of T12 vertebral body with moderate BILATERAL endplate can cavity and loss of height estimated 30-50%. Retropulsion of abnormal bone at T12 without conus compression. No other pathologic compression deformities. Focal myeloma deposit midline T9 posteriorly measuring 10 x 10 x 20 mm. No significant mass effect on the spinal cord.  No significant thoracic disc protrusion or spinal stenosis. Mild annular bulging T7-T8. No cord compression at any level. Small layering BILATERAL effusions. Bibasilar atelectasis.  MR LUMBAR SPINE FINDINGS  Diffusely abnormal marrow consistent with the history of myeloma. Focal deposit posterior aspect of L3 on the RIGHT measures 19 x 13 x 15 mm. No pathologic compression deformity. Mild stenosis at L4-5 relates to ordinary spondylosis with annular bulging and facet arthropathy. Focal rightward protrusion at this level narrows the foramen, potentially affecting the L4 nerve root. No obstructive uropathy.  IMPRESSION: MR THORACIC SPINE IMPRESSION  Pathologic compression fracture at  T8 in this patient with widespread multiple myeloma. Slight retropulsion of abnormal bone without conus compression.  MR LUMBAR SPINE IMPRESSION  Focal myeloma deposits at L3 and T9 without significant compression deformity.  Mild stenosis at L4-5 related to ordinary spondylosis.  Electronically Signed: By: Rolla Flatten M.D. On: 04/16/2015 20:14   Ct Biopsy  03/27/2015   CLINICAL DATA:  MONOCLONAL GAMMOPATHY OF UNKNOWN SIGNIFICANCE  EXAM: CT GUIDED RIGHT ILIAC BONE MARROW ASPIRATION AND CORE BIOPSY  Date:  3/30/20163/30/2016 9:38 am  Radiologist:  M. Daryll Brod, MD  Guidance:  CT  FLUOROSCOPY TIME:  None.  MEDICATIONS AND MEDICAL HISTORY: 2 mg Versed, 100 mcg fentanyl  ANESTHESIA/SEDATION: 15 minutes  CONTRAST:  None.  COMPLICATIONS: None  PROCEDURE: Informed consent was obtained from the patient following explanation of the procedure, risks, benefits and alternatives. The patient understands, agrees and consents for the procedure. All questions were addressed. A time out was performed.  The patient was positioned prone and noncontrast localization CT was performed of the pelvis to demonstrate the iliac marrow spaces.  Maximal barrier sterile technique utilized including caps, mask, sterile gowns, sterile gloves, large sterile drape, hand hygiene, and betadine prep.  Under sterile conditions and local anesthesia, an 11 gauge coaxial bone biopsy needle was advanced into the right iliac marrow space. Needle position was confirmed with CT imaging. Initially, bone marrow aspiration was performed. Next, the 11 gauge outer cannula was utilized to obtain a right iliac bone marrow core biopsy. Needle was removed. Hemostasis was obtained with compression. The patient tolerated the procedure well. Samples were prepared with the cytotechnologist. No immediate complications.  IMPRESSION: CT guided right iliac bone marrow aspiration and core biopsy.   Electronically Signed   By: Jerilynn Mages.  Shick M.D.   On: 03/27/2015 12:27   Dg  Bone Survey Met  03/19/2015   CLINICAL DATA:  Monoclonal gammopathy of unknown significance. Rule out lytic lesions.  EXAM: METASTATIC BONE SURVEY  COMPARISON:  None.  FINDINGS: The skull has an unexpected mottled appearance diffusely. Although this is reminiscent of a "salt and pepper skull" seen in hyperparathyroidism, noted normal serum calcium and lack of other bony findings. The bones are also diffusely osteopenic, especially for patient age and gender. No discrete punched-out lytic lesion is identified. No evidence of pathologic fracture. No evidence of acute intra-abdominal or intrathoracic disease.  IMPRESSION: No discrete lytic lesion, but there is osteopenia and calvarial heterogeneity -nonspecific findings which can be manifestations of myeloma.   Electronically Signed   By: Monte Fantasia M.D.   On: 03/19/2015 14:51  Labs:  CBC:  Recent Labs  04/09/15 1038 04/12/15 0958 04/16/15 0702 04/17/15 0509  WBC 6.1 7.5 9.1 8.0  HGB 14.9 14.9 13.3 14.6  HCT 43.2 42.3 39.1 44.0  PLT 276 293 225 263    COAGS:  Recent Labs  03/27/15 0720 04/17/15 0509  INR 0.98 1.01    BMP:  Recent Labs  04/09/15 1038 04/12/15 1114 04/16/15 0702 04/17/15 0509  NA 137 138 134* 138  K 3.3* 3.1* 3.0* 3.7  CL  --  111 109 109  CO2 19* '22 20 23  ' GLUCOSE 91 120* 119* 143*  BUN 10.'7 10 14 15  ' CALCIUM 9.6 10.3 10.1 10.5  CREATININE 1.6* 1.54* 1.53* 1.46*  GFRNONAA  --  46* 46* 49*  GFRAA  --  53* 53* 56*    LIVER FUNCTION TESTS:  Recent Labs  04/09/15 1038 04/12/15 1114 04/16/15 0702 04/17/15 0509  BILITOT 0.49 0.6 0.6 0.7  AST '28 31 30 ' 36  ALT 41 48 51 48  ALKPHOS 188* 171* 128* 147*  PROT 8.9* 9.7* 7.8 8.8*  ALBUMIN 4.3 4.8 4.0 4.2    TUMOR MARKERS: No results for input(s): AFPTM, CEA, CA199, CHROMGRNA in the last 8760 hours.  Assessment and Plan: Patient with history of multiple myeloma who presents now with acute on chronic back pain and finding of new pathologic T12  compression fracture. Request now received for T 12 vertebroplasty/kyphoplasty, possible biopsy. Imaging studies have been reviewed and patient appears to be candidate for above procedure. Details/risks of procedure, including not limited to, internal bleeding, infection, nerve injury, injury to adjacent structures, inability to eradicate back pain, discussed with patient with his understanding and consent. Procedure tentatively planned for 4/21.  Signed: Autumn Messing 04/17/2015, 5:02 PM   I spent a total of   20 minutes  in face to face in clinical consultation, greater than 50% of which was counseling/coordinating care for T 12 vertebroplasty/kyphoplasty with possible biopsy.

## 2015-04-17 NOTE — Progress Notes (Addendum)
Radiation Oncology         [phone] ________________________________  Initial inpatient Consultation  Name: Eric Dudley MRN: [id number]  Date: 04/16/2015  DOB: Nov 15, 1951  MV:[id number],ZMOQH, MD  No ref. provider found   REFERRING PHYSICIAN: [name]  DIAGNOSIS: Multiple myeloma C90.00   HISTORY OF PRESENT ILLNESS::Eric Dudley is a 64 y.o. male who has been undergoing systemic treatment for a recent diagnosis of plasma cell neoplasm, considered multiple myeloma now.  BM bx on 3-30 was c/w plasma cell neoplasm.  Acute back pain, sever, developed about 4 days ago prompting admission.  MRI of T and L spine are negative for cord compression but notable for diffuse marrow changes from myeloma and acute/subacute T12 pathologic compression fracture (also lesions at T9, L3 noted and appreciated and discussed with Dr Jola Baptist).  Pain is better now at rest but severe with movements.  Unable to exam patient well due to sever pain /screaming when he moves to his side. He says he was crawling at home due to pain.  He denies numbness, weakness, tingling, or changes in Bowel or Bladder function other than trouble making it to the toilet in time due to pain. Has a urinary catheter now.  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    PAST SURGICAL HISTORY: Past Surgical History  Procedure Laterality Date  . Appendectomy  1962  . Shoulder surgery  1999    left  . Knee surgery  2012    right    FAMILY HISTORY: family history includes Cancer in his father and sister; Cancer (age of onset: 30) in his mother; Hypertension in his sister.  SOCIAL HISTORY:  reports that he quit smoking about 11 years ago. His smoking use included Cigars. He quit smokeless tobacco use about 20 years ago. His  smokeless tobacco use included Chew. He reports that he drinks alcohol. He reports that he does not use illicit drugs.  ALLERGIES: Hydrochlorothiazide; Asa; Hydrocodone; Amoxicillin; and Indapamide  MEDICATIONS:  Current Facility-Administered Medications  Medication Dose Route Frequency Provider Last Rate Last Dose  . acyclovir (ZOVIRAX) tablet 400 mg  400 mg Oral BID Nita Sells, MD   400 mg at 04/16/15 2140  . antiseptic oral rinse (CPC / CETYLPYRIDINIUM CHLORIDE 0.05%) solution 7 mL  7 mL Mouth Rinse q12n4p Nita Sells, MD   7 mL at 04/16/15 1801  . chlorhexidine (PERIDEX) 0.12 % solution 15 mL  15 mL Mouth Rinse BID Nita Sells, MD   15 mL at 04/16/15 1430  . dexamethasone (DECADRON) injection 10 mg  10 mg Intravenous 4 times per day Nita Sells, MD   10 mg at 04/17/15 0545  . ezetimibe-simvastatin (VYTORIN) 10-40 MG per tablet 1 tablet  1 tablet Oral QHS Nita Sells, MD   1 tablet at 04/16/15 2140  . famotidine (PEPCID) tablet 20 mg  20 mg Oral Daily Nita Sells, MD   20 mg at 04/16/15 1552  . heparin injection 5,000 Units  5,000 Units Subcutaneous 3 times per day Nita Sells, MD   5,000 Units at 04/17/15 0545  . HYDROmorphone (DILAUDID) injection 1 mg  1 mg Intravenous Q2H PRN Nita Sells, MD   1 mg at 04/17/15 0545  . irbesartan (AVAPRO) tablet 75 mg  75 mg Oral Daily Nita Sells, MD   75 mg at 04/16/15  1552  . LORazepam (ATIVAN) injection 2 mg  2 mg Intravenous Once Tatyana Kirichenko, PA-C   Stopped at 04/16/15 1316  . potassium chloride SA (K-DUR,KLOR-CON) CR tablet 40 mEq  40 mEq Oral Daily Nita Sells, MD   40 mEq at 04/16/15 1801  . psyllium (HYDROCIL/METAMUCIL) packet 1 packet  1 packet Oral BID Nita Sells, MD   1 packet at 04/16/15 2140  . senna-docusate (Senokot-S) tablet 2 tablet  2 tablet Oral BID Nita Sells, MD   2 tablet at 04/16/15 2140    REVIEW OF SYSTEMS:  Notable for that  above.   PHYSICAL EXAM:  height is '5\' 8"'  (1.727 m) and weight is 202 lb (91.627 kg). His axillary temperature is 97.5 F (36.4 C). His blood pressure is 120/63 and his pulse is 64. His respiration is 20 and oxygen saturation is 94%.   Moving all extremities, screams with severe pain when moving to side from supine position. Denies numbness. Alert, oriented, with intact insight. + urinary catheter   ECOG = 4  0 - Asymptomatic (Fully active, able to carry on all predisease activities without restriction)  1 - Symptomatic but completely ambulatory (Restricted in physically strenuous activity but ambulatory and able to carry out work of a light or sedentary nature. For example, light housework, office work)  2 - Symptomatic, <50% in bed during the day (Ambulatory and capable of all self care but unable to carry out any work activities. Up and about more than 50% of waking hours)  3 - Symptomatic, >50% in bed, but not bedbound (Capable of only limited self-care, confined to bed or chair 50% or more of waking hours)  4 - Bedbound (Completely disabled. Cannot carry on any self-care. Totally confined to bed or chair)  5 - Death   Eustace Pen MM, Creech RH, Tormey DC, et al. [phone]). "Toxicity and response criteria of the [facility]". [place] Oncol. 5 (6): 649-55   LABORATORY DATA:  Lab Results  Component Value Date   WBC 8.0 04/17/2015   HGB 14.6 04/17/2015   HCT 44.0 04/17/2015   MCV 100.0 04/17/2015   PLT 263 04/17/2015   CMP     Component Value Date/Time   NA 138 04/17/2015 0509   NA 137 04/09/2015 1038   K 3.7 04/17/2015 0509   K 3.3* 04/09/2015 1038   CL 109 04/17/2015 0509   CO2 23 04/17/2015 0509   CO2 19* 04/09/2015 1038   GLUCOSE 143* 04/17/2015 0509   GLUCOSE 91 04/09/2015 1038   BUN 15 04/17/2015 0509   BUN 10.7 04/09/2015 1038   CREATININE 1.46* 04/17/2015 0509   CREATININE 1.6* 04/09/2015 1038   CALCIUM 10.5 04/17/2015 0509   CALCIUM 9.6  04/09/2015 1038   PROT 8.8* 04/17/2015 0509   PROT 8.9* 04/09/2015 1038   ALBUMIN 4.2 04/17/2015 0509   ALBUMIN 4.3 04/09/2015 1038   AST 36 04/17/2015 0509   AST 28 04/09/2015 1038   ALT 48 04/17/2015 0509   ALT 41 04/09/2015 1038   ALKPHOS 147* 04/17/2015 0509   ALKPHOS 188* 04/09/2015 1038   BILITOT 0.7 04/17/2015 0509   BILITOT 0.49 04/09/2015 1038   GFRNONAA 49* 04/17/2015 0509   GFRAA 56* 04/17/2015 0509         RADIOGRAPHY: Ct Abdomen Pelvis Wo Contrast  04/16/2015   CLINICAL DATA:  Bilateral back pain starting Friday, history of tonsillar cancer 10 years ago  EXAM: CT ABDOMEN AND PELVIS WITHOUT CONTRAST  TECHNIQUE: Multidetector  CT imaging of the abdomen and pelvis was performed following the standard protocol without IV contrast.  COMPARISON:  03/19/2015 bone survey  FINDINGS: Lung bases shows bilateral basilar posterior atelectasis. There is significant osteoporosis thoracolumbar spine.  There is interval moderate compression deformity of T12 vertebral body. Subacute compression fracture is suspected. Further correlation with MRI is recommended as clinically warranted.  Axial image 45 there is a lytic lesion L3 vertebral body measures about 1.4 cm. This is best seen on sagittal image 61. Metastatic disease cannot be excluded. Further correlation with MRI is recommended.  Unenhanced liver shows no biliary ductal dilatation. Small hiatal hernia.  Tiny calcified gallstones are noted within gallbladder the largest measures 3.8 mm. Unenhanced pancreas, spleen and adrenal glands are unremarkable. Unenhanced kidneys are symmetrical in size. No hydronephrosis or hydroureter. Nonobstructive calculus in lower pole of the right kidney measures 4 mm. There is a cortical calcification in lower pole of the left kidney measures 3 mm.  No calcified ureteral calculi are noted bilaterally. Atherosclerotic calcifications of abdominal aorta and iliac arteries.  No small bowel obstruction. Moderate stool  noted in right colon and cecum. No pericecal inflammation. The terminal ileum is unremarkable. Moderate distended urinary bladder. Prostate gland and seminal vesicles are unremarkable. Diffuse osteoporosis noted within pelvis and bilateral hips.  IMPRESSION: 1. Diffuse osteoporosis thoracolumbar spine. There is interval moderate compression deformity of T12 vertebral body. Subacute pathologic compression fracture cannot be excluded. There is a lytic lesion L3 vertebral body measures about 1.4 cm. Metastatic disease cannot be excluded. Further correlation with MRI is recommended. 2. Bilateral nonobstructive nephrolithiasis. No hydronephrosis or hydroureter. 3. No calcified ureteral calculi. 4. No pericecal inflammation.  Unremarkable terminal ileum. 5. Small layering gallstones are noted within gallbladder the largest measures 3.8 mm. 6. Moderate distended urinary bladder. No stones are noted within urinary bladder   Electronically Signed   By: Lahoma Crocker M.D.   On: 04/16/2015 10:39   Mr Thoracic Spine Wo Contrast  04/16/2015   CLINICAL DATA:  Patient with multiple myeloma and acute back pain. Symptoms for several weeks, much worse in the past 4 days. Initial encounter.  EXAM: MRI THORACIC AND LUMBAR SPINE WITHOUT CONTRAST  TECHNIQUE: Multiplanar and multiecho pulse sequences of the thoracic and lumbar spine were obtained without intravenous contrast.  COMPARISON:  CT urogram earlier today.  FINDINGS: MR THORACIC SPINE FINDINGS  Numbering of the thoracic spine was accomplished by counting down from the odontoid. New  Diffuse marrow signal abnormality consistent with myeloma. Minor endplate softening superiorly and inferiorly at T9. There is an acute to subacute compression fracture of T12 vertebral body with moderate BILATERAL endplate can cavity and loss of height estimated 30-50%. Retropulsion of abnormal bone at T12 without conus compression. No other pathologic compression deformities. Focal myeloma deposit  midline T9 posteriorly measuring 10 x 10 x 20 mm. No significant mass effect on the spinal cord.  No significant thoracic disc protrusion or spinal stenosis. Mild annular bulging T7-T8. No cord compression at any level. Small layering BILATERAL effusions. Bibasilar atelectasis.  MR LUMBAR SPINE FINDINGS  Diffusely abnormal marrow consistent with the history of myeloma. Focal deposit posterior aspect of L3 on the RIGHT measures 19 x 13 x 15 mm. No pathologic compression deformity. Mild stenosis at L4-5 relates to ordinary spondylosis with annular bulging and facet arthropathy. Focal rightward protrusion at this level narrows the foramen, potentially affecting the L4 nerve root. No obstructive uropathy.  IMPRESSION: MR THORACIC SPINE IMPRESSION  Pathologic compression fracture  at T8 in this patient with widespread multiple myeloma. Slight retropulsion of abnormal bone without conus compression.  MR LUMBAR SPINE IMPRESSION  Focal myeloma deposits at L3 and T9 without significant compression deformity.  Mild stenosis at L4-5 related to ordinary spondylosis.   Electronically Signed   By: Rolla Flatten M.D.   On: 04/16/2015 20:14   Mr Lumbar Spine Wo Contrast  04/16/2015   CLINICAL DATA:  Patient with multiple myeloma and acute back pain. Symptoms for several weeks, much worse in the past 4 days. Initial encounter.  EXAM: MRI THORACIC AND LUMBAR SPINE WITHOUT CONTRAST  TECHNIQUE: Multiplanar and multiecho pulse sequences of the thoracic and lumbar spine were obtained without intravenous contrast.  COMPARISON:  CT urogram earlier today.  FINDINGS: MR THORACIC SPINE FINDINGS  Numbering of the thoracic spine was accomplished by counting down from the odontoid. New  Diffuse marrow signal abnormality consistent with myeloma. Minor endplate softening superiorly and inferiorly at T9. There is an acute to subacute compression fracture of T12 vertebral body with moderate BILATERAL endplate can cavity and loss of height estimated  30-50%. Retropulsion of abnormal bone at T12 without conus compression. No other pathologic compression deformities. Focal myeloma deposit midline T9 posteriorly measuring 10 x 10 x 20 mm. No significant mass effect on the spinal cord.  No significant thoracic disc protrusion or spinal stenosis. Mild annular bulging T7-T8. No cord compression at any level. Small layering BILATERAL effusions. Bibasilar atelectasis.  MR LUMBAR SPINE FINDINGS  Diffusely abnormal marrow consistent with the history of myeloma. Focal deposit posterior aspect of L3 on the RIGHT measures 19 x 13 x 15 mm. No pathologic compression deformity. Mild stenosis at L4-5 relates to ordinary spondylosis with annular bulging and facet arthropathy. Focal rightward protrusion at this level narrows the foramen, potentially affecting the L4 nerve root. No obstructive uropathy.  IMPRESSION: MR THORACIC SPINE IMPRESSION  Pathologic compression fracture at T8 in this patient with widespread multiple myeloma. Slight retropulsion of abnormal bone without conus compression.  MR LUMBAR SPINE IMPRESSION  Focal myeloma deposits at L3 and T9 without significant compression deformity.  Mild stenosis at L4-5 related to ordinary spondylosis.   Electronically Signed   By: Rolla Flatten M.D.   On: 04/16/2015 20:14   Ct Biopsy  03/27/2015   CLINICAL DATA:  MONOCLONAL GAMMOPATHY OF UNKNOWN SIGNIFICANCE  EXAM: CT GUIDED RIGHT ILIAC BONE MARROW ASPIRATION AND CORE BIOPSY  Date:  3/30/20163/30/2016 9:38 am  Radiologist:  M. Daryll Brod, MD  Guidance:  CT  FLUOROSCOPY TIME:  None.  MEDICATIONS AND MEDICAL HISTORY: 2 mg Versed, 100 mcg fentanyl  ANESTHESIA/SEDATION: 15 minutes  CONTRAST:  None.  COMPLICATIONS: None  PROCEDURE: Informed consent was obtained from the patient following explanation of the procedure, risks, benefits and alternatives. The patient understands, agrees and consents for the procedure. All questions were addressed. A time out was performed.  The  patient was positioned prone and noncontrast localization CT was performed of the pelvis to demonstrate the iliac marrow spaces.  Maximal barrier sterile technique utilized including caps, mask, sterile gowns, sterile gloves, large sterile drape, hand hygiene, and betadine prep.  Under sterile conditions and local anesthesia, an 11 gauge coaxial bone biopsy needle was advanced into the right iliac marrow space. Needle position was confirmed with CT imaging. Initially, bone marrow aspiration was performed. Next, the 11 gauge outer cannula was utilized to obtain a right iliac bone marrow core biopsy. Needle was removed. Hemostasis was obtained with compression. The patient tolerated  the procedure well. Samples were prepared with the cytotechnologist. No immediate complications.  IMPRESSION: CT guided right iliac bone marrow aspiration and core biopsy.   Electronically Signed   By: Jerilynn Mages.  Shick M.D.   On: 03/27/2015 12:27   Dg Bone Survey Met  03/19/2015   CLINICAL DATA:  Monoclonal gammopathy of unknown significance. Rule out lytic lesions.  EXAM: METASTATIC BONE SURVEY  COMPARISON:  None.  FINDINGS: The skull has an unexpected mottled appearance diffusely. Although this is reminiscent of a "salt and pepper skull" seen in hyperparathyroidism, noted normal serum calcium and lack of other bony findings. The bones are also diffusely osteopenic, especially for patient age and gender. No discrete punched-out lytic lesion is identified. No evidence of pathologic fracture. No evidence of acute intra-abdominal or intrathoracic disease.  IMPRESSION: No discrete lytic lesion, but there is osteopenia and calvarial heterogeneity -nonspecific findings which can be manifestations of myeloma.   Electronically Signed   By: Monte Fantasia M.D.   On: 03/19/2015 14:51      IMPRESSION/PLAN: I discussed with patient, his family, and hospitalist the value of consulting with IR to explore vertebro/kypohoplasty which I imagine will give  best chance of pain alleviation at T12. I "examined" his pain regions by pointing to areas of his wife's back; the area of most severe pain seems to roughly correlate with the T12 lesion, radiating along the low left back.  We can proceed with 2 weeks of palliative RT after this procedure.   We discussed the available radiation techniques, and focused on the details of logistics and delivery.    We discussed the risks, benefits, and side effects of radiotherapy. Side effects may include but not necessarily be limited to: GI upset, skin irritation and fatigue.  No guarantees of treatment were given. A consent form was signed and placed in the patient's medical record.  The patient was encouraged to ask questions that I answered to the best of my ability.   I spent 25 minutes  face to face with the patient and more than 50% of that time was spent in counseling and/or coordination of care.  __________________________________________   Eppie Gibson, MD

## 2015-04-17 NOTE — Progress Notes (Addendum)
PROGRESS NOTE    CHAYIM BIALAS TSV:[id number] DOB: 1951-03-05 DOA: 04/16/2015 PCP: [name], MD  Primary Medical Oncologist: Dr. Truitt Merle. Primary Radiation Oncologist: Dr. Isidore Moos  HPI/Brief narrative 64 year old male patient with newly diagnosed multiple myeloma, recently started chemotherapy last week, presented with acute onset of back pain. Workup reveals T12 pathological fracture. Neurosurgery does not recommend surgical intervention but recommend IR consultation for vertebroplasty/kyphoplasty. IR & Oncology consulted.   Assessment/Plan:  Principal Problem:   Vertebral compression fracture: T12 pathological fracture/acute back pain - Reviewed the MRI results - Discussed MRI results with Dr. Deri Fuelling, neurosurgery on 4/20: He recommends no surgical intervention and recommends interventional radiology consultation for vertebroplasty/kyphoplasty - Consulted interventional radiology - Since no cord compression, as discussed with medical and radiation oncology, will discontinue IV Decadron - As discussed with radiation oncology, will receive palliative radiation treatment in 2 weeks - Pain management and bowel regimen - Heparin DVT prophylaxis held for possible VP/KP  Active Problems:   CKD (chronic kidney disease), stage II - Creatinine seems to be at baseline. Monitor    Multiple myeloma - Radiation and medical oncology input appreciated - As per oncology, start Revlimid at home dose 10 MG daily from 4/21 - Oncologist will give Velcade on 4/22 if he is still in house - L3 and T9 multiple myeloma lesions seen on MRI.    HTN (hypertension) - Controlled - Continue Diovan    Hyperlipidemia - Continue Vytorin    Hypokalemia - Replaced    Constipation - Multifactorial - Bowel regimen    H/O Tonsillar cancer    Code Status: Full Family Communication: Discussed with spouse at bedside Disposition Plan: Home when medically stable in 2-3  days   Consultants:  Medical Oncology  Radiation Oncology  IR  Procedures:  Foley catheter-DC'd 4/20  Antibiotics:  None   Subjective: Back pain is better controlled but gets acutely worse with movement. Denies lower extremity weakness, tingling or numbness. Constipation: Last p.m. 6 days ago. No abdominal pain. No sphincter disturbance reported.  Objective: Filed Vitals:   04/16/15 1347 04/16/15 2100 04/17/15 0519 04/17/15 1500  BP: 144/68 117/59 120/63 124/66  Pulse: 63 62 64 72  Temp: 97.8 F (36.6 C) 98 F (36.7 C) 97.5 F (36.4 C) 97 F (36.1 C)  TempSrc: Oral Oral Axillary Oral  Resp: '18 18 20 18  ' Height:      Weight:      SpO2: 96% 90% 94% 96%    Intake/Output Summary (Last 24 hours) at 04/17/15 1658 Last data filed at 04/17/15 0900  Gross per 24 hour  Intake    240 ml  Output   2600 ml  Net  -2360 ml   Filed Weights   04/16/15 0557  Weight: 91.627 kg (202 lb)     Exam:  General exam: Pleasant middle-aged male lying comfortably in bed Respiratory system: Clear. No increased work of breathing. Cardiovascular system: S1 & S2 heard, RRR. No JVD, murmurs, gallops, clicks or pedal edema. Gastrointestinal system: Abdomen is nondistended, soft and nontender. Normal bowel sounds heard. Central nervous system: Alert and oriented. No focal neurological deficits. Extremities: Symmetric 5 x 5 power.   Data Reviewed: Basic Metabolic Panel:  Recent Labs Lab 04/12/15 1114 04/16/15 0702 04/17/15 0509  NA 138 134* 138  K 3.1* 3.0* 3.7  CL 111 109 109  CO2 '22 20 23  ' GLUCOSE 120* 119* 143*  BUN '10 14 15  ' CREATININE 1.54* 1.53* 1.46*  CALCIUM 10.3 10.1 10.5  Liver Function Tests:  Recent Labs Lab 04/12/15 1114 04/16/15 0702 04/17/15 0509  AST 31 30 36  ALT 48 51 48  ALKPHOS 171* 128* 147*  BILITOT 0.6 0.6 0.7  PROT 9.7* 7.8 8.8*  ALBUMIN 4.8 4.0 4.2   No results for input(s): LIPASE, AMYLASE in the last 168 hours. No results for  input(s): AMMONIA in the last 168 hours. CBC:  Recent Labs Lab 04/12/15 0958 04/16/15 0702 04/17/15 0509  WBC 7.5 9.1 8.0  NEUTROABS 5.8 7.1  --   HGB 14.9 13.3 14.6  HCT 42.3 39.1 44.0  MCV 96.1 97.5 100.0  PLT 293 225 263   Cardiac Enzymes: No results for input(s): CKTOTAL, CKMB, CKMBINDEX, TROPONINI in the last 168 hours. BNP (last 3 results) No results for input(s): PROBNP in the last 8760 hours. CBG: No results for input(s): GLUCAP in the last 168 hours.  No results found for this or any previous visit (from the past 240 hour(s)).        Studies: Ct Abdomen Pelvis Wo Contrast  04/16/2015   CLINICAL DATA:  Bilateral back pain starting Friday, history of tonsillar cancer 10 years ago  EXAM: CT ABDOMEN AND PELVIS WITHOUT CONTRAST  TECHNIQUE: Multidetector CT imaging of the abdomen and pelvis was performed following the standard protocol without IV contrast.  COMPARISON:  03/19/2015 bone survey  FINDINGS: Lung bases shows bilateral basilar posterior atelectasis. There is significant osteoporosis thoracolumbar spine.  There is interval moderate compression deformity of T12 vertebral body. Subacute compression fracture is suspected. Further correlation with MRI is recommended as clinically warranted.  Axial image 45 there is a lytic lesion L3 vertebral body measures about 1.4 cm. This is best seen on sagittal image 61. Metastatic disease cannot be excluded. Further correlation with MRI is recommended.  Unenhanced liver shows no biliary ductal dilatation. Small hiatal hernia.  Tiny calcified gallstones are noted within gallbladder the largest measures 3.8 mm. Unenhanced pancreas, spleen and adrenal glands are unremarkable. Unenhanced kidneys are symmetrical in size. No hydronephrosis or hydroureter. Nonobstructive calculus in lower pole of the right kidney measures 4 mm. There is a cortical calcification in lower pole of the left kidney measures 3 mm.  No calcified ureteral calculi are  noted bilaterally. Atherosclerotic calcifications of abdominal aorta and iliac arteries.  No small bowel obstruction. Moderate stool noted in right colon and cecum. No pericecal inflammation. The terminal ileum is unremarkable. Moderate distended urinary bladder. Prostate gland and seminal vesicles are unremarkable. Diffuse osteoporosis noted within pelvis and bilateral hips.  IMPRESSION: 1. Diffuse osteoporosis thoracolumbar spine. There is interval moderate compression deformity of T12 vertebral body. Subacute pathologic compression fracture cannot be excluded. There is a lytic lesion L3 vertebral body measures about 1.4 cm. Metastatic disease cannot be excluded. Further correlation with MRI is recommended. 2. Bilateral nonobstructive nephrolithiasis. No hydronephrosis or hydroureter. 3. No calcified ureteral calculi. 4. No pericecal inflammation.  Unremarkable terminal ileum. 5. Small layering gallstones are noted within gallbladder the largest measures 3.8 mm. 6. Moderate distended urinary bladder. No stones are noted within urinary bladder   Electronically Signed   By: Lahoma Crocker M.D.   On: 04/16/2015 10:39   Mr Thoracic Spine Wo Contrast  04/17/2015   ADDENDUM REPORT: 04/17/2015 09:01  ADDENDUM: The MRI thoracic spine Impression should read: Pathologic compression fracture at T12 in this patient with widespread multiple myeloma, not T8. Findings discussed with Dr. Isidore Moos.   Electronically Signed   By: Rolla Flatten M.D.   On: 04/17/2015  09:01   04/17/2015   CLINICAL DATA:  Patient with multiple myeloma and acute back pain. Symptoms for several weeks, much worse in the past 4 days. Initial encounter.  EXAM: MRI THORACIC AND LUMBAR SPINE WITHOUT CONTRAST  TECHNIQUE: Multiplanar and multiecho pulse sequences of the thoracic and lumbar spine were obtained without intravenous contrast.  COMPARISON:  CT urogram earlier today.  FINDINGS: MR THORACIC SPINE FINDINGS  Numbering of the thoracic spine was accomplished  by counting down from the odontoid. New  Diffuse marrow signal abnormality consistent with myeloma. Minor endplate softening superiorly and inferiorly at T9. There is an acute to subacute compression fracture of T12 vertebral body with moderate BILATERAL endplate can cavity and loss of height estimated 30-50%. Retropulsion of abnormal bone at T12 without conus compression. No other pathologic compression deformities. Focal myeloma deposit midline T9 posteriorly measuring 10 x 10 x 20 mm. No significant mass effect on the spinal cord.  No significant thoracic disc protrusion or spinal stenosis. Mild annular bulging T7-T8. No cord compression at any level. Small layering BILATERAL effusions. Bibasilar atelectasis.  MR LUMBAR SPINE FINDINGS  Diffusely abnormal marrow consistent with the history of myeloma. Focal deposit posterior aspect of L3 on the RIGHT measures 19 x 13 x 15 mm. No pathologic compression deformity. Mild stenosis at L4-5 relates to ordinary spondylosis with annular bulging and facet arthropathy. Focal rightward protrusion at this level narrows the foramen, potentially affecting the L4 nerve root. No obstructive uropathy.  IMPRESSION: MR THORACIC SPINE IMPRESSION  Pathologic compression fracture at T8 in this patient with widespread multiple myeloma. Slight retropulsion of abnormal bone without conus compression.  MR LUMBAR SPINE IMPRESSION  Focal myeloma deposits at L3 and T9 without significant compression deformity.  Mild stenosis at L4-5 related to ordinary spondylosis.  Electronically Signed: By: Rolla Flatten M.D. On: 04/16/2015 20:14   Mr Lumbar Spine Wo Contrast  04/17/2015   ADDENDUM REPORT: 04/17/2015 09:01  ADDENDUM: The MRI thoracic spine Impression should read: Pathologic compression fracture at T12 in this patient with widespread multiple myeloma, not T8. Findings discussed with Dr. Isidore Moos.   Electronically Signed   By: Rolla Flatten M.D.   On: 04/17/2015 09:01   04/17/2015   CLINICAL  DATA:  Patient with multiple myeloma and acute back pain. Symptoms for several weeks, much worse in the past 4 days. Initial encounter.  EXAM: MRI THORACIC AND LUMBAR SPINE WITHOUT CONTRAST  TECHNIQUE: Multiplanar and multiecho pulse sequences of the thoracic and lumbar spine were obtained without intravenous contrast.  COMPARISON:  CT urogram earlier today.  FINDINGS: MR THORACIC SPINE FINDINGS  Numbering of the thoracic spine was accomplished by counting down from the odontoid. New  Diffuse marrow signal abnormality consistent with myeloma. Minor endplate softening superiorly and inferiorly at T9. There is an acute to subacute compression fracture of T12 vertebral body with moderate BILATERAL endplate can cavity and loss of height estimated 30-50%. Retropulsion of abnormal bone at T12 without conus compression. No other pathologic compression deformities. Focal myeloma deposit midline T9 posteriorly measuring 10 x 10 x 20 mm. No significant mass effect on the spinal cord.  No significant thoracic disc protrusion or spinal stenosis. Mild annular bulging T7-T8. No cord compression at any level. Small layering BILATERAL effusions. Bibasilar atelectasis.  MR LUMBAR SPINE FINDINGS  Diffusely abnormal marrow consistent with the history of myeloma. Focal deposit posterior aspect of L3 on the RIGHT measures 19 x 13 x 15 mm. No pathologic compression deformity. Mild stenosis at L4-5  relates to ordinary spondylosis with annular bulging and facet arthropathy. Focal rightward protrusion at this level narrows the foramen, potentially affecting the L4 nerve root. No obstructive uropathy.  IMPRESSION: MR THORACIC SPINE IMPRESSION  Pathologic compression fracture at T8 in this patient with widespread multiple myeloma. Slight retropulsion of abnormal bone without conus compression.  MR LUMBAR SPINE IMPRESSION  Focal myeloma deposits at L3 and T9 without significant compression deformity.  Mild stenosis at L4-5 related to ordinary  spondylosis.  Electronically Signed: By: Rolla Flatten M.D. On: 04/16/2015 20:14        Scheduled Meds: . acyclovir  400 mg Oral BID  . antiseptic oral rinse  7 mL Mouth Rinse q12n4p  . chlorhexidine  15 mL Mouth Rinse BID  . ezetimibe-simvastatin  1 tablet Oral QHS  . famotidine  20 mg Oral Daily  . irbesartan  75 mg Oral Daily  . LORazepam  2 mg Intravenous Once  . potassium chloride  40 mEq Oral Daily  . psyllium  1 packet Oral BID  . senna-docusate  2 tablet Oral BID   Continuous Infusions:   Principal Problem:   Vertebral compression fracture: T12 pathological fracture Active Problems:   CKD (chronic kidney disease), stage II   Multiple myeloma   HTN (hypertension)   Hyperlipidemia   Tonsillar cancer   Acute back pain    Time spent: 40 minutes.    Vernell Leep, MD, FACP, FHM. [facility] Pager [phone]  If 7PM-7AM, please contact night-coverage [email or web address] Password [email or web address] 04/17/2015, 4:58 PM    LOS: 1 day

## 2015-04-17 NOTE — Progress Notes (Signed)
Eric Dudley   DOB:May 26, 1951   XA#:[id number]   MCN#:[id number]  Subjective: patient still has sever back pain with changing position, not able to get out bed without help. Mild pain when lay flat.     Objective:  Filed Vitals:   04/17/15 1500  BP: 124/66  Pulse: 72  Temp: 97 F (36.1 C)  Resp: 18    Body mass index is 30.72 kg/(m^2).  Intake/Output Summary (Last 24 hours) at 04/17/15 1548 Last data filed at 04/17/15 0900  Gross per 24 hour  Intake    240 ml  Output   2600 ml  Net  -2360 ml     Sclerae unicteric  Oropharynx clear  No peripheral adenopathy  Lungs clear -- no rales or rhonchi  Heart regular rate and rhythm  Abdomen benign  MSK no focal spinal tenderness, no peripheral edema  Neuro nonfocal   CBG (last 3)  No results for input(s): GLUCAP in the last 72 hours.   Labs:  Lab Results  Component Value Date   WBC 8.0 04/17/2015   HGB 14.6 04/17/2015   HCT 44.0 04/17/2015   MCV 100.0 04/17/2015   PLT 263 04/17/2015   NEUTROABS 7.1 04/16/2015    '@LASTCHEMISTRY' @  Urine Studies No results for input(s): UHGB, CRYS in the last 72 hours.  Invalid input(s): UACOL, UAPR, USPG, UPH, UTP, UGL, UKET, UBIL, UNIT, UROB, ULEU, UEPI, UWBC, URBC, UBAC, CAST, [place], [place]  Basic Metabolic Panel:  Recent Labs Lab 04/12/15 1114 04/16/15 0702 04/17/15 0509  NA 138 134* 138  K 3.1* 3.0* 3.7  CL 111 109 109  CO2 '22 20 23  ' GLUCOSE 120* 119* 143*  BUN '10 14 15  ' CREATININE 1.54* 1.53* 1.46*  CALCIUM 10.3 10.1 10.5   GFR Estimated Creatinine Clearance: 55.4 mL/min (by C-G formula based on Cr of 1.46). Liver Function Tests:  Recent Labs Lab 04/12/15 1114 04/16/15 0702 04/17/15 0509  AST 31 30 36  ALT 48 51 48  ALKPHOS 171* 128* 147*  BILITOT 0.6 0.6 0.7  PROT 9.7* 7.8 8.8*  ALBUMIN 4.8 4.0 4.2   No results for input(s): LIPASE, AMYLASE in the last 168 hours. No results for input(s): AMMONIA in the last 168 hours. Coagulation profile  Recent  Labs Lab 04/17/15 0509  INR 1.01    CBC:  Recent Labs Lab 04/12/15 0958 04/16/15 0702 04/17/15 0509  WBC 7.5 9.1 8.0  NEUTROABS 5.8 7.1  --   HGB 14.9 13.3 14.6  HCT 42.3 39.1 44.0  MCV 96.1 97.5 100.0  PLT 293 225 263   Cardiac Enzymes: No results for input(s): CKTOTAL, CKMB, CKMBINDEX, TROPONINI in the last 168 hours. BNP: Invalid input(s): POCBNP CBG: No results for input(s): GLUCAP in the last 168 hours. D-Dimer No results for input(s): DDIMER in the last 72 hours. Hgb A1c No results for input(s): HGBA1C in the last 72 hours. Lipid Profile No results for input(s): CHOL, HDL, LDLCALC, TRIG, CHOLHDL, LDLDIRECT in the last 72 hours. Thyroid function studies No results for input(s): TSH, T4TOTAL, T3FREE, THYROIDAB in the last 72 hours.  Invalid input(s): FREET3 Anemia work up No results for input(s): VITAMINB12, FOLATE, FERRITIN, TIBC, IRON, RETICCTPCT in the last 72 hours. Microbiology No results found for this or any previous visit (from the past 240 hour(s)).    Studies:  Ct Abdomen Pelvis Wo Contrast  04/16/2015   CLINICAL DATA:  Bilateral back pain starting Friday, history of tonsillar cancer 10 years ago  EXAM: CT ABDOMEN AND  PELVIS WITHOUT CONTRAST  TECHNIQUE: Multidetector CT imaging of the abdomen and pelvis was performed following the standard protocol without IV contrast.  COMPARISON:  03/19/2015 bone survey  FINDINGS: Lung bases shows bilateral basilar posterior atelectasis. There is significant osteoporosis thoracolumbar spine.  There is interval moderate compression deformity of T12 vertebral body. Subacute compression fracture is suspected. Further correlation with MRI is recommended as clinically warranted.  Axial image 45 there is a lytic lesion L3 vertebral body measures about 1.4 cm. This is best seen on sagittal image 61. Metastatic disease cannot be excluded. Further correlation with MRI is recommended.  Unenhanced liver shows no biliary ductal  dilatation. Small hiatal hernia.  Tiny calcified gallstones are noted within gallbladder the largest measures 3.8 mm. Unenhanced pancreas, spleen and adrenal glands are unremarkable. Unenhanced kidneys are symmetrical in size. No hydronephrosis or hydroureter. Nonobstructive calculus in lower pole of the right kidney measures 4 mm. There is a cortical calcification in lower pole of the left kidney measures 3 mm.  No calcified ureteral calculi are noted bilaterally. Atherosclerotic calcifications of abdominal aorta and iliac arteries.  No small bowel obstruction. Moderate stool noted in right colon and cecum. No pericecal inflammation. The terminal ileum is unremarkable. Moderate distended urinary bladder. Prostate gland and seminal vesicles are unremarkable. Diffuse osteoporosis noted within pelvis and bilateral hips.  IMPRESSION: 1. Diffuse osteoporosis thoracolumbar spine. There is interval moderate compression deformity of T12 vertebral body. Subacute pathologic compression fracture cannot be excluded. There is a lytic lesion L3 vertebral body measures about 1.4 cm. Metastatic disease cannot be excluded. Further correlation with MRI is recommended. 2. Bilateral nonobstructive nephrolithiasis. No hydronephrosis or hydroureter. 3. No calcified ureteral calculi. 4. No pericecal inflammation.  Unremarkable terminal ileum. 5. Small layering gallstones are noted within gallbladder the largest measures 3.8 mm. 6. Moderate distended urinary bladder. No stones are noted within urinary bladder   Electronically Signed   By: Lahoma Crocker M.D.   On: 04/16/2015 10:39   Mr Thoracic Spine Wo Contrast  04/17/2015   ADDENDUM REPORT: 04/17/2015 09:01  ADDENDUM: The MRI thoracic spine Impression should read: Pathologic compression fracture at T12 in this patient with widespread multiple myeloma, not T8. Findings discussed with Dr. Isidore Moos.   Electronically Signed   By: Rolla Flatten M.D.   On: 04/17/2015 09:01   04/17/2015    CLINICAL DATA:  Patient with multiple myeloma and acute back pain. Symptoms for several weeks, much worse in the past 4 days. Initial encounter.  EXAM: MRI THORACIC AND LUMBAR SPINE WITHOUT CONTRAST  TECHNIQUE: Multiplanar and multiecho pulse sequences of the thoracic and lumbar spine were obtained without intravenous contrast.  COMPARISON:  CT urogram earlier today.  FINDINGS: MR THORACIC SPINE FINDINGS  Numbering of the thoracic spine was accomplished by counting down from the odontoid. New  Diffuse marrow signal abnormality consistent with myeloma. Minor endplate softening superiorly and inferiorly at T9. There is an acute to subacute compression fracture of T12 vertebral body with moderate BILATERAL endplate can cavity and loss of height estimated 30-50%. Retropulsion of abnormal bone at T12 without conus compression. No other pathologic compression deformities. Focal myeloma deposit midline T9 posteriorly measuring 10 x 10 x 20 mm. No significant mass effect on the spinal cord.  No significant thoracic disc protrusion or spinal stenosis. Mild annular bulging T7-T8. No cord compression at any level. Small layering BILATERAL effusions. Bibasilar atelectasis.  MR LUMBAR SPINE FINDINGS  Diffusely abnormal marrow consistent with the history of myeloma. Focal deposit posterior  aspect of L3 on the RIGHT measures 19 x 13 x 15 mm. No pathologic compression deformity. Mild stenosis at L4-5 relates to ordinary spondylosis with annular bulging and facet arthropathy. Focal rightward protrusion at this level narrows the foramen, potentially affecting the L4 nerve root. No obstructive uropathy.  IMPRESSION: MR THORACIC SPINE IMPRESSION  Pathologic compression fracture at T8 in this patient with widespread multiple myeloma. Slight retropulsion of abnormal bone without conus compression.  MR LUMBAR SPINE IMPRESSION  Focal myeloma deposits at L3 and T9 without significant compression deformity.  Mild stenosis at L4-5 related to  ordinary spondylosis.  Electronically Signed: By: Rolla Flatten M.D. On: 04/16/2015 20:14   Mr Lumbar Spine Wo Contrast  04/17/2015   ADDENDUM REPORT: 04/17/2015 09:01  ADDENDUM: The MRI thoracic spine Impression should read: Pathologic compression fracture at T12 in this patient with widespread multiple myeloma, not T8. Findings discussed with Dr. Isidore Moos.   Electronically Signed   By: Rolla Flatten M.D.   On: 04/17/2015 09:01   04/17/2015   CLINICAL DATA:  Patient with multiple myeloma and acute back pain. Symptoms for several weeks, much worse in the past 4 days. Initial encounter.  EXAM: MRI THORACIC AND LUMBAR SPINE WITHOUT CONTRAST  TECHNIQUE: Multiplanar and multiecho pulse sequences of the thoracic and lumbar spine were obtained without intravenous contrast.  COMPARISON:  CT urogram earlier today.  FINDINGS: MR THORACIC SPINE FINDINGS  Numbering of the thoracic spine was accomplished by counting down from the odontoid. New  Diffuse marrow signal abnormality consistent with myeloma. Minor endplate softening superiorly and inferiorly at T9. There is an acute to subacute compression fracture of T12 vertebral body with moderate BILATERAL endplate can cavity and loss of height estimated 30-50%. Retropulsion of abnormal bone at T12 without conus compression. No other pathologic compression deformities. Focal myeloma deposit midline T9 posteriorly measuring 10 x 10 x 20 mm. No significant mass effect on the spinal cord.  No significant thoracic disc protrusion or spinal stenosis. Mild annular bulging T7-T8. No cord compression at any level. Small layering BILATERAL effusions. Bibasilar atelectasis.  MR LUMBAR SPINE FINDINGS  Diffusely abnormal marrow consistent with the history of myeloma. Focal deposit posterior aspect of L3 on the RIGHT measures 19 x 13 x 15 mm. No pathologic compression deformity. Mild stenosis at L4-5 relates to ordinary spondylosis with annular bulging and facet arthropathy. Focal rightward  protrusion at this level narrows the foramen, potentially affecting the L4 nerve root. No obstructive uropathy.  IMPRESSION: MR THORACIC SPINE IMPRESSION  Pathologic compression fracture at T8 in this patient with widespread multiple myeloma. Slight retropulsion of abnormal bone without conus compression.  MR LUMBAR SPINE IMPRESSION  Focal myeloma deposits at L3 and T9 without significant compression deformity.  Mild stenosis at L4-5 related to ordinary spondylosis.  Electronically Signed: By: Rolla Flatten M.D. On: 04/16/2015 20:14    Assessment: 64 y.o. with newly diagnosed multiple myeloma, just started chemotherapy last week. Presented with worsening back pain.  1. Multiple myeloma, on chemotherapy 2. MM related bone lesions, and T8 compression fracture 3. severe back pain secondary to #2  Plan: -spine MRI reviewed -Appreciated consulting neurosurgery and rad/onc -I agree with kyphoplasty -I'll discuss with Dr. Isidore Moos about adjuvant radiation -If he is going this being the hospital for several days, please restart Revlimid at home dose 67m daily, from tomorrow. -I will give Velcade on Friday if he is still in house   -consider start oxycodone 5-174mevery 4 hrs for his back pain  after kyphoplasty, and add oxycontin in 2-3 days if needed (pain not well controlled or needing oxycodone more than 63m daily). OK to give iv pain meds if needed for now -watch for constipation, stool softness and laxatives as needed  I'll follow him when he is in the hospital.   FTruitt Merle MD 04/17/2015  3:48 PM

## 2015-04-18 ENCOUNTER — Inpatient Hospital Stay (HOSPITAL_COMMUNITY): Payer: Medicare Other

## 2015-04-18 DIAGNOSIS — M899 Disorder of bone, unspecified: Secondary | ICD-10-CM

## 2015-04-18 DIAGNOSIS — M4854XD Collapsed vertebra, not elsewhere classified, thoracic region, subsequent encounter for fracture with routine healing: Secondary | ICD-10-CM

## 2015-04-18 MED ORDER — CYCLOBENZAPRINE HCL 5 MG PO TABS
5.0000 mg | ORAL_TABLET | Freq: Three times a day (TID) | ORAL | Status: DC
  Administered 2015-04-18 – 2015-04-22 (×15): 5 mg via ORAL
  Filled 2015-04-18 (×19): qty 1

## 2015-04-18 MED ORDER — BISACODYL 10 MG RE SUPP
10.0000 mg | Freq: Once | RECTAL | Status: AC
  Administered 2015-04-18: 10 mg via RECTAL
  Filled 2015-04-18: qty 1

## 2015-04-18 MED ORDER — HYDROMORPHONE HCL 2 MG/ML IJ SOLN
2.0000 mg | INTRAMUSCULAR | Status: DC | PRN
  Administered 2015-04-18 – 2015-04-20 (×8): 2 mg via INTRAVENOUS
  Filled 2015-04-18 (×8): qty 1

## 2015-04-18 MED ORDER — MIDAZOLAM HCL 2 MG/2ML IJ SOLN
INTRAMUSCULAR | Status: AC
  Filled 2015-04-18: qty 6

## 2015-04-18 MED ORDER — FENTANYL CITRATE (PF) 100 MCG/2ML IJ SOLN
INTRAMUSCULAR | Status: AC
  Filled 2015-04-18: qty 4

## 2015-04-18 MED ORDER — LIDOCAINE HCL (PF) 1 % IJ SOLN
INTRAMUSCULAR | Status: AC
  Filled 2015-04-18: qty 30

## 2015-04-18 NOTE — Progress Notes (Signed)
Patient ID: Eric Dudley, male   DOB: 12-23-1951, 64 y.o.   MRN: [id number] Patient originally scheduled for T12 kyphoplasty today, however when he presented to IR department and during the course of movement on the stretcher he stated that he could not tolerate procedure with IV conscious sedation; arrangements have been made with anesthesia for assistance on 4/22. Procedure is tent  scheduled for 10:00 AM. Patient aware of plans.

## 2015-04-18 NOTE — Progress Notes (Signed)
PROGRESS NOTE    Eric Dudley MWU:[id number] DOB: October 28, 1951 DOA: 04/16/2015 PCP: [name], MD  Primary Medical Oncologist: Dr. Truitt Merle. Primary Radiation Oncologist: Dr. Isidore Moos  HPI/Brief narrative 64 year old male patient with newly diagnosed multiple myeloma, recently started chemotherapy last week, presented with acute onset of back pain. Workup reveals T12 pathological fracture. Neurosurgery does not recommend surgical intervention but recommend IR consultation for vertebroplasty/kyphoplasty. IR & Oncology consulted.   Assessment/Plan:  Principal Problem:   Vertebral compression fracture: T12 pathological fracture/acute back pain - Reviewed the MRI results - Discussed MRI results with Dr. Deri Fuelling, neurosurgery on 4/20: He recommends no surgical intervention and recommends interventional radiology consultation for vertebroplasty/kyphoplasty - Consulted interventional radiology - Since no cord compression, as discussed with medical and radiation oncology, discontinued IV Decadron - As discussed with radiation oncology, will receive palliative radiation treatment in 2 weeks - Pain management and bowel regimen - Heparin DVT prophylaxis held for possible VP/KP - Patient with significant pain this morning-adjusted IV Dilaudid and added Flexeril for muscle spasms - Patient unable to do VP/KP on 4/21 and apparently rescheduled for 4/22.  Active Problems:   CKD (chronic kidney disease), stage II - Creatinine seems to be at baseline. Monitor    Multiple myeloma - Radiation and medical oncology input appreciated - As per oncology, started Revlimid at home dose 10 MG daily from 4/21 - Oncologist will give Velcade on 4/22 if he is still in house - L3 and T9 multiple myeloma lesions seen on MRI.    HTN (hypertension) - Controlled - Continue Diovan    Hyperlipidemia - Continue Vytorin    Hypokalemia - Replaced    Constipation - Multifactorial - Bowel regimen.  Still no BM. Dulcolax suppository 4/21    H/O Tonsillar cancer    Code Status: Full Family Communication: None at bedside Disposition Plan: Home when medically stable in 2-3 days   Consultants:  Medical Oncology  Radiation Oncology  IR  Procedures:  Foley catheter-DC'd 4/20  Antibiotics:  None   Subjective: Back pain worsened by minimal movement-severe. No BM yet. Has not tried Dulcolax suppository.  Objective: Filed Vitals:   04/17/15 2232 04/18/15 0633 04/18/15 0750 04/18/15 1005  BP: 111/57 116/62 118/65 122/71  Pulse: 64 59 58 57  Temp: 98.3 F (36.8 C) 97.8 F (36.6 C) 97.9 F (36.6 C) 97.6 F (36.4 C)  TempSrc: Oral Oral Oral Oral  Resp: '18 18 16 18  ' Height:      Weight:      SpO2: 92% 92% 94% 91%    Intake/Output Summary (Last 24 hours) at 04/18/15 1401 Last data filed at 04/18/15 1347  Gross per 24 hour  Intake    240 ml  Output   2400 ml  Net  -2160 ml   Filed Weights   04/16/15 0557  Weight: 91.627 kg (202 lb)     Exam:  General exam: Pleasant middle-aged male lying in bed with painful distress this morning. Respiratory system: Clear. No increased work of breathing. Cardiovascular system: S1 & S2 heard, RRR. No JVD, murmurs, gallops, clicks or pedal edema. Gastrointestinal system: Abdomen is nondistended, soft and nontender. Normal bowel sounds heard. Central nervous system: Alert and oriented. No focal neurological deficits. Extremities: Symmetric 5 x 5 power.   Data Reviewed: Basic Metabolic Panel:  Recent Labs Lab 04/12/15 1114 04/16/15 0702 04/17/15 0509  NA 138 134* 138  K 3.1* 3.0* 3.7  CL 111 109 109  CO2 '22 20 23  ' GLUCOSE  120* 119* 143*  BUN '10 14 15  ' CREATININE 1.54* 1.53* 1.46*  CALCIUM 10.3 10.1 10.5   Liver Function Tests:  Recent Labs Lab 04/12/15 1114 04/16/15 0702 04/17/15 0509  AST 31 30 36  ALT 48 51 48  ALKPHOS 171* 128* 147*  BILITOT 0.6 0.6 0.7  PROT 9.7* 7.8 8.8*  ALBUMIN 4.8 4.0 4.2    No results for input(s): LIPASE, AMYLASE in the last 168 hours. No results for input(s): AMMONIA in the last 168 hours. CBC:  Recent Labs Lab 04/12/15 0958 04/16/15 0702 04/17/15 0509  WBC 7.5 9.1 8.0  NEUTROABS 5.8 7.1  --   HGB 14.9 13.3 14.6  HCT 42.3 39.1 44.0  MCV 96.1 97.5 100.0  PLT 293 225 263   Cardiac Enzymes: No results for input(s): CKTOTAL, CKMB, CKMBINDEX, TROPONINI in the last 168 hours. BNP (last 3 results) No results for input(s): PROBNP in the last 8760 hours. CBG: No results for input(s): GLUCAP in the last 168 hours.  No results found for this or any previous visit (from the past 240 hour(s)).        Studies: Mr Thoracic Spine Wo Contrast  04/17/2015   ADDENDUM REPORT: 04/17/2015 09:01  ADDENDUM: The MRI thoracic spine Impression should read: Pathologic compression fracture at T12 in this patient with widespread multiple myeloma, not T8. Findings discussed with Dr. Isidore Moos.   Electronically Signed   By: Rolla Flatten M.D.   On: 04/17/2015 09:01   04/17/2015   CLINICAL DATA:  Patient with multiple myeloma and acute back pain. Symptoms for several weeks, much worse in the past 4 days. Initial encounter.  EXAM: MRI THORACIC AND LUMBAR SPINE WITHOUT CONTRAST  TECHNIQUE: Multiplanar and multiecho pulse sequences of the thoracic and lumbar spine were obtained without intravenous contrast.  COMPARISON:  CT urogram earlier today.  FINDINGS: MR THORACIC SPINE FINDINGS  Numbering of the thoracic spine was accomplished by counting down from the odontoid. New  Diffuse marrow signal abnormality consistent with myeloma. Minor endplate softening superiorly and inferiorly at T9. There is an acute to subacute compression fracture of T12 vertebral body with moderate BILATERAL endplate can cavity and loss of height estimated 30-50%. Retropulsion of abnormal bone at T12 without conus compression. No other pathologic compression deformities. Focal myeloma deposit midline T9  posteriorly measuring 10 x 10 x 20 mm. No significant mass effect on the spinal cord.  No significant thoracic disc protrusion or spinal stenosis. Mild annular bulging T7-T8. No cord compression at any level. Small layering BILATERAL effusions. Bibasilar atelectasis.  MR LUMBAR SPINE FINDINGS  Diffusely abnormal marrow consistent with the history of myeloma. Focal deposit posterior aspect of L3 on the RIGHT measures 19 x 13 x 15 mm. No pathologic compression deformity. Mild stenosis at L4-5 relates to ordinary spondylosis with annular bulging and facet arthropathy. Focal rightward protrusion at this level narrows the foramen, potentially affecting the L4 nerve root. No obstructive uropathy.  IMPRESSION: MR THORACIC SPINE IMPRESSION  Pathologic compression fracture at T8 in this patient with widespread multiple myeloma. Slight retropulsion of abnormal bone without conus compression.  MR LUMBAR SPINE IMPRESSION  Focal myeloma deposits at L3 and T9 without significant compression deformity.  Mild stenosis at L4-5 related to ordinary spondylosis.  Electronically Signed: By: Rolla Flatten M.D. On: 04/16/2015 20:14   Mr Lumbar Spine Wo Contrast  04/17/2015   ADDENDUM REPORT: 04/17/2015 09:01  ADDENDUM: The MRI thoracic spine Impression should read: Pathologic compression fracture at T12 in this patient  with widespread multiple myeloma, not T8. Findings discussed with Dr. Isidore Moos.   Electronically Signed   By: Rolla Flatten M.D.   On: 04/17/2015 09:01   04/17/2015   CLINICAL DATA:  Patient with multiple myeloma and acute back pain. Symptoms for several weeks, much worse in the past 4 days. Initial encounter.  EXAM: MRI THORACIC AND LUMBAR SPINE WITHOUT CONTRAST  TECHNIQUE: Multiplanar and multiecho pulse sequences of the thoracic and lumbar spine were obtained without intravenous contrast.  COMPARISON:  CT urogram earlier today.  FINDINGS: MR THORACIC SPINE FINDINGS  Numbering of the thoracic spine was accomplished by  counting down from the odontoid. New  Diffuse marrow signal abnormality consistent with myeloma. Minor endplate softening superiorly and inferiorly at T9. There is an acute to subacute compression fracture of T12 vertebral body with moderate BILATERAL endplate can cavity and loss of height estimated 30-50%. Retropulsion of abnormal bone at T12 without conus compression. No other pathologic compression deformities. Focal myeloma deposit midline T9 posteriorly measuring 10 x 10 x 20 mm. No significant mass effect on the spinal cord.  No significant thoracic disc protrusion or spinal stenosis. Mild annular bulging T7-T8. No cord compression at any level. Small layering BILATERAL effusions. Bibasilar atelectasis.  MR LUMBAR SPINE FINDINGS  Diffusely abnormal marrow consistent with the history of myeloma. Focal deposit posterior aspect of L3 on the RIGHT measures 19 x 13 x 15 mm. No pathologic compression deformity. Mild stenosis at L4-5 relates to ordinary spondylosis with annular bulging and facet arthropathy. Focal rightward protrusion at this level narrows the foramen, potentially affecting the L4 nerve root. No obstructive uropathy.  IMPRESSION: MR THORACIC SPINE IMPRESSION  Pathologic compression fracture at T8 in this patient with widespread multiple myeloma. Slight retropulsion of abnormal bone without conus compression.  MR LUMBAR SPINE IMPRESSION  Focal myeloma deposits at L3 and T9 without significant compression deformity.  Mild stenosis at L4-5 related to ordinary spondylosis.  Electronically Signed: By: Rolla Flatten M.D. On: 04/16/2015 20:14        Scheduled Meds: . acyclovir  400 mg Oral BID  . antiseptic oral rinse  7 mL Mouth Rinse q12n4p  . chlorhexidine  15 mL Mouth Rinse BID  . cyclobenzaprine  5 mg Oral TID  . ezetimibe-simvastatin  1 tablet Oral QHS  . famotidine  20 mg Oral Daily  . irbesartan  75 mg Oral Daily  . lenalidomide  10 mg Oral Daily  . polyethylene glycol  17 g Oral BID   . potassium chloride  40 mEq Oral Daily  . senna-docusate  2 tablet Oral BID  . vancomycin  1,000 mg Intravenous On Call   Continuous Infusions:   Principal Problem:   Vertebral compression fracture: T12 pathological fracture Active Problems:   CKD (chronic kidney disease), stage II   Multiple myeloma   HTN (hypertension)   Hyperlipidemia   Tonsillar cancer   Acute back pain   T12 compression fracture   Thoracic back pain    Time spent: 20 minutes.    Vernell Leep, MD, FACP, FHM. [facility] Pager [phone]  If 7PM-7AM, please contact night-coverage [email or web address] Password [email or web address] 04/18/2015, 2:01 PM    LOS: 2 days

## 2015-04-18 NOTE — Progress Notes (Signed)
Eric Dudley   DOB:Jan 01, 1951   QJ#:[id number]   LSL#:[id number]  Subjective: patient still has sever back pain with changing position, was scheduled to have a T12 kyphoplasty with conscious sedation but couldn't tolerate due to the pain. He is scheduled for kyphoplasty under general anesthesia tomorrow.   Objective:  Filed Vitals:   04/18/15 1400  BP: 114/65  Pulse: 67  Temp: 97.6 F (36.4 C)  Resp: 12    Body mass index is 30.72 kg/(m^2).  Intake/Output Summary (Last 24 hours) at 04/18/15 1756 Last data filed at 04/18/15 1347  Gross per 24 hour  Intake      0 ml  Output    700 ml  Net   -700 ml     Sclerae unicteric  Oropharynx clear  No peripheral adenopathy  Lungs clear -- no rales or rhonchi  Heart regular rate and rhythm  Abdomen benign  MSK (+) thoracic spinal tenderness, no peripheral edema  Neuro nonfocal   CBG (last 3)  No results for input(s): GLUCAP in the last 72 hours.   Labs:  Lab Results  Component Value Date   WBC 8.0 04/17/2015   HGB 14.6 04/17/2015   HCT 44.0 04/17/2015   MCV 100.0 04/17/2015   PLT 263 04/17/2015   NEUTROABS 7.1 04/16/2015    '@LASTCHEMISTRY' @  Urine Studies No results for input(s): UHGB, CRYS in the last 72 hours.  Invalid input(s): UACOL, UAPR, USPG, UPH, UTP, UGL, UKET, UBIL, UNIT, UROB, ULEU, UEPI, UWBC, URBC, UBAC, CAST, [place], [place]  Basic Metabolic Panel:  Recent Labs Lab 04/12/15 1114 04/16/15 0702 04/17/15 0509  NA 138 134* 138  K 3.1* 3.0* 3.7  CL 111 109 109  CO2 '22 20 23  ' GLUCOSE 120* 119* 143*  BUN '10 14 15  ' CREATININE 1.54* 1.53* 1.46*  CALCIUM 10.3 10.1 10.5   GFR Estimated Creatinine Clearance: 55.4 mL/min (by C-G formula based on Cr of 1.46). Liver Function Tests:  Recent Labs Lab 04/12/15 1114 04/16/15 0702 04/17/15 0509  AST 31 30 36  ALT 48 51 48  ALKPHOS 171* 128* 147*  BILITOT 0.6 0.6 0.7  PROT 9.7* 7.8 8.8*  ALBUMIN 4.8 4.0 4.2   No results for input(s): LIPASE, AMYLASE in  the last 168 hours. No results for input(s): AMMONIA in the last 168 hours. Coagulation profile  Recent Labs Lab 04/17/15 0509  INR 1.01    CBC:  Recent Labs Lab 04/12/15 0958 04/16/15 0702 04/17/15 0509  WBC 7.5 9.1 8.0  NEUTROABS 5.8 7.1  --   HGB 14.9 13.3 14.6  HCT 42.3 39.1 44.0  MCV 96.1 97.5 100.0  PLT 293 225 263   Cardiac Enzymes: No results for input(s): CKTOTAL, CKMB, CKMBINDEX, TROPONINI in the last 168 hours. BNP: Invalid input(s): POCBNP CBG: No results for input(s): GLUCAP in the last 168 hours. D-Dimer No results for input(s): DDIMER in the last 72 hours. Hgb A1c No results for input(s): HGBA1C in the last 72 hours. Lipid Profile No results for input(s): CHOL, HDL, LDLCALC, TRIG, CHOLHDL, LDLDIRECT in the last 72 hours. Thyroid function studies No results for input(s): TSH, T4TOTAL, T3FREE, THYROIDAB in the last 72 hours.  Invalid input(s): FREET3 Anemia work up No results for input(s): VITAMINB12, FOLATE, FERRITIN, TIBC, IRON, RETICCTPCT in the last 72 hours. Microbiology No results found for this or any previous visit (from the past 240 hour(s)).    Studies:  Mr Thoracic Spine Wo Contrast  04/17/2015   ADDENDUM REPORT: 04/17/2015 09:01  ADDENDUM: The MRI thoracic spine Impression should read: Pathologic compression fracture at T12 in this patient with widespread multiple myeloma, not T8. Findings discussed with Dr. Isidore Moos.   Electronically Signed   By: Rolla Flatten M.D.   On: 04/17/2015 09:01   04/17/2015   CLINICAL DATA:  Patient with multiple myeloma and acute back pain. Symptoms for several weeks, much worse in the past 4 days. Initial encounter.  EXAM: MRI THORACIC AND LUMBAR SPINE WITHOUT CONTRAST  TECHNIQUE: Multiplanar and multiecho pulse sequences of the thoracic and lumbar spine were obtained without intravenous contrast.  COMPARISON:  CT urogram earlier today.  FINDINGS: MR THORACIC SPINE FINDINGS  Numbering of the thoracic spine was  accomplished by counting down from the odontoid. New  Diffuse marrow signal abnormality consistent with myeloma. Minor endplate softening superiorly and inferiorly at T9. There is an acute to subacute compression fracture of T12 vertebral body with moderate BILATERAL endplate can cavity and loss of height estimated 30-50%. Retropulsion of abnormal bone at T12 without conus compression. No other pathologic compression deformities. Focal myeloma deposit midline T9 posteriorly measuring 10 x 10 x 20 mm. No significant mass effect on the spinal cord.  No significant thoracic disc protrusion or spinal stenosis. Mild annular bulging T7-T8. No cord compression at any level. Small layering BILATERAL effusions. Bibasilar atelectasis.  MR LUMBAR SPINE FINDINGS  Diffusely abnormal marrow consistent with the history of myeloma. Focal deposit posterior aspect of L3 on the RIGHT measures 19 x 13 x 15 mm. No pathologic compression deformity. Mild stenosis at L4-5 relates to ordinary spondylosis with annular bulging and facet arthropathy. Focal rightward protrusion at this level narrows the foramen, potentially affecting the L4 nerve root. No obstructive uropathy.  IMPRESSION: MR THORACIC SPINE IMPRESSION  Pathologic compression fracture at T8 in this patient with widespread multiple myeloma. Slight retropulsion of abnormal bone without conus compression.  MR LUMBAR SPINE IMPRESSION  Focal myeloma deposits at L3 and T9 without significant compression deformity.  Mild stenosis at L4-5 related to ordinary spondylosis.  Electronically Signed: By: Rolla Flatten M.D. On: 04/16/2015 20:14   Mr Lumbar Spine Wo Contrast  04/17/2015   ADDENDUM REPORT: 04/17/2015 09:01  ADDENDUM: The MRI thoracic spine Impression should read: Pathologic compression fracture at T12 in this patient with widespread multiple myeloma, not T8. Findings discussed with Dr. Isidore Moos.   Electronically Signed   By: Rolla Flatten M.D.   On: 04/17/2015 09:01   04/17/2015    CLINICAL DATA:  Patient with multiple myeloma and acute back pain. Symptoms for several weeks, much worse in the past 4 days. Initial encounter.  EXAM: MRI THORACIC AND LUMBAR SPINE WITHOUT CONTRAST  TECHNIQUE: Multiplanar and multiecho pulse sequences of the thoracic and lumbar spine were obtained without intravenous contrast.  COMPARISON:  CT urogram earlier today.  FINDINGS: MR THORACIC SPINE FINDINGS  Numbering of the thoracic spine was accomplished by counting down from the odontoid. New  Diffuse marrow signal abnormality consistent with myeloma. Minor endplate softening superiorly and inferiorly at T9. There is an acute to subacute compression fracture of T12 vertebral body with moderate BILATERAL endplate can cavity and loss of height estimated 30-50%. Retropulsion of abnormal bone at T12 without conus compression. No other pathologic compression deformities. Focal myeloma deposit midline T9 posteriorly measuring 10 x 10 x 20 mm. No significant mass effect on the spinal cord.  No significant thoracic disc protrusion or spinal stenosis. Mild annular bulging T7-T8. No cord compression at any level. Small layering BILATERAL  effusions. Bibasilar atelectasis.  MR LUMBAR SPINE FINDINGS  Diffusely abnormal marrow consistent with the history of myeloma. Focal deposit posterior aspect of L3 on the RIGHT measures 19 x 13 x 15 mm. No pathologic compression deformity. Mild stenosis at L4-5 relates to ordinary spondylosis with annular bulging and facet arthropathy. Focal rightward protrusion at this level narrows the foramen, potentially affecting the L4 nerve root. No obstructive uropathy.  IMPRESSION: MR THORACIC SPINE IMPRESSION  Pathologic compression fracture at T8 in this patient with widespread multiple myeloma. Slight retropulsion of abnormal bone without conus compression.  MR LUMBAR SPINE IMPRESSION  Focal myeloma deposits at L3 and T9 without significant compression deformity.  Mild stenosis at L4-5 related  to ordinary spondylosis.  Electronically Signed: By: Rolla Flatten M.D. On: 04/16/2015 20:14    Assessment: 64 y.o. with newly diagnosed multiple myeloma, just started chemotherapy last week. Presented with worsening back pain.  1. Multiple myeloma, on chemotherapy 2. MM related bone lesions, and T12 compression fracture 3. severe back pain secondary to #2  Plan: -spine MRI reviewed -Appreciated consulting neurosurgery and rad/onc -I agree with kyphoplasty --restart Revlimid at home dose 26m daily today -I will give Velcade tomorrow afternoon after his kyphoplasty procedure, alone with dexa 473monce (I will lorder) -consider start oxycodone 5-1057mvery 4 hrs for his back pain after kyphoplasty, and add oxycontin in 2-3 days if needed (pain not well controlled or needing oxycodone more than 35m16mily). OK to give iv pain meds if needed for now -watch for constipation, stool softness and laxatives as needed  I'll follow him when he is in the hospital.   Eric Dudley 04/18/2015  5:56 PM

## 2015-04-19 ENCOUNTER — Inpatient Hospital Stay (HOSPITAL_COMMUNITY): Payer: Medicare Other | Admitting: Anesthesiology

## 2015-04-19 ENCOUNTER — Inpatient Hospital Stay (HOSPITAL_COMMUNITY): Payer: Medicare Other

## 2015-04-19 ENCOUNTER — Other Ambulatory Visit: Payer: TRICARE For Life (TFL)

## 2015-04-19 ENCOUNTER — Ambulatory Visit: Payer: TRICARE For Life (TFL)

## 2015-04-19 ENCOUNTER — Other Ambulatory Visit: Payer: Self-pay | Admitting: Hematology

## 2015-04-19 ENCOUNTER — Encounter (HOSPITAL_COMMUNITY): Payer: Self-pay | Admitting: Registered Nurse

## 2015-04-19 ENCOUNTER — Encounter (HOSPITAL_COMMUNITY)
Admission: EM | Disposition: A | Discharge status: Skilled Nursing Facility | Payer: Self-pay | Source: Home / Self Care | Attending: Internal Medicine

## 2015-04-19 ENCOUNTER — Ambulatory Visit: Payer: TRICARE For Life (TFL) | Admitting: Hematology

## 2015-04-19 DIAGNOSIS — M4854XA Collapsed vertebra, not elsewhere classified, thoracic region, initial encounter for fracture: Secondary | ICD-10-CM

## 2015-04-19 DIAGNOSIS — M4850XA Collapsed vertebra, not elsewhere classified, site unspecified, initial encounter for fracture: Secondary | ICD-10-CM

## 2015-04-19 LAB — BASIC METABOLIC PANEL
Anion gap: 7 (ref 5–15)
BUN: 26 mg/dL — AB (ref 6–23)
CALCIUM: 10.1 mg/dL (ref 8.4–10.5)
CHLORIDE: 107 mmol/L (ref 96–112)
CO2: 25 mmol/L (ref 19–32)
Creatinine, Ser: 2.02 mg/dL — ABNORMAL HIGH (ref 0.50–1.35)
GFR calc non Af Amer: 33 mL/min — ABNORMAL LOW (ref >90)
GFR, EST AFRICAN AMERICAN: 38 mL/min — AB (ref >90)
Glucose, Bld: 112 mg/dL — ABNORMAL HIGH (ref 70–99)
Potassium: 3.1 mmol/L — ABNORMAL LOW (ref 3.5–5.1)
Sodium: 139 mmol/L (ref 135–145)

## 2015-04-19 LAB — SURGICAL PCR SCREEN
MRSA, PCR: NEGATIVE
STAPHYLOCOCCUS AUREUS: NEGATIVE

## 2015-04-19 SURGERY — RADIOLOGY WITH ANESTHESIA
Anesthesia: General

## 2015-04-19 MED ORDER — GLYCOPYRROLATE 0.2 MG/ML IJ SOLN
INTRAMUSCULAR | Status: DC | PRN
  Administered 2015-04-19: 0.2 mg via INTRAVENOUS

## 2015-04-19 MED ORDER — PROPOFOL 10 MG/ML IV BOLUS
INTRAVENOUS | Status: AC
  Filled 2015-04-19: qty 20

## 2015-04-19 MED ORDER — SODIUM CHLORIDE 0.9 % IV SOLN
10.0000 mg | INTRAVENOUS | Status: DC | PRN
  Administered 2015-04-19: 50 ug/min via INTRAVENOUS

## 2015-04-19 MED ORDER — ONDANSETRON HCL 4 MG/2ML IJ SOLN
INTRAMUSCULAR | Status: AC
  Filled 2015-04-19: qty 2

## 2015-04-19 MED ORDER — LIDOCAINE HCL (CARDIAC) 20 MG/ML IV SOLN
INTRAVENOUS | Status: DC | PRN
  Administered 2015-04-19: 100 mg via INTRAVENOUS

## 2015-04-19 MED ORDER — EPHEDRINE SULFATE 50 MG/ML IJ SOLN
INTRAMUSCULAR | Status: DC | PRN
  Administered 2015-04-19 (×3): 5 mg via INTRAVENOUS

## 2015-04-19 MED ORDER — LIDOCAINE HCL (CARDIAC) 20 MG/ML IV SOLN
INTRAVENOUS | Status: AC
  Filled 2015-04-19: qty 5

## 2015-04-19 MED ORDER — PROPOFOL INFUSION 10 MG/ML OPTIME
INTRAVENOUS | Status: DC | PRN
  Administered 2015-04-19: 140 ug/kg/min via INTRAVENOUS

## 2015-04-19 MED ORDER — MIDAZOLAM HCL 2 MG/2ML IJ SOLN
INTRAMUSCULAR | Status: AC
  Filled 2015-04-19: qty 2

## 2015-04-19 MED ORDER — DEXAMETHASONE 6 MG PO TABS
40.0000 mg | ORAL_TABLET | Freq: Once | ORAL | Status: AC
  Administered 2015-04-19: 40 mg via ORAL
  Filled 2015-04-19: qty 1

## 2015-04-19 MED ORDER — PROPOFOL 10 MG/ML IV BOLUS
INTRAVENOUS | Status: DC | PRN
  Administered 2015-04-19: 180 mg via INTRAVENOUS

## 2015-04-19 MED ORDER — VANCOMYCIN HCL IN DEXTROSE 1-5 GM/200ML-% IV SOLN
1000.0000 mg | Freq: Once | INTRAVENOUS | Status: AC
  Administered 2015-04-19: 1000 mg via INTRAVENOUS
  Filled 2015-04-19: qty 200

## 2015-04-19 MED ORDER — SUCCINYLCHOLINE CHLORIDE 20 MG/ML IJ SOLN
INTRAMUSCULAR | Status: DC | PRN
  Administered 2015-04-19: 100 mg via INTRAVENOUS

## 2015-04-19 MED ORDER — SODIUM CHLORIDE 0.9 % IJ SOLN
INTRAMUSCULAR | Status: AC
  Administered 2015-04-19: 10 mL
  Filled 2015-04-19: qty 3

## 2015-04-19 MED ORDER — LACTATED RINGERS IV SOLN
INTRAVENOUS | Status: DC | PRN
  Administered 2015-04-19: 10:00:00 via INTRAVENOUS

## 2015-04-19 MED ORDER — BORTEZOMIB CHEMO SQ INJECTION 3.5 MG (2.5MG/ML)
1.3000 mg/m2 | Freq: Once | INTRAMUSCULAR | Status: AC
  Administered 2015-04-19: 2.75 mg via SUBCUTANEOUS
  Filled 2015-04-19: qty 1.1

## 2015-04-19 MED ORDER — ONDANSETRON HCL 4 MG/2ML IJ SOLN
INTRAMUSCULAR | Status: DC | PRN
  Administered 2015-04-19: 4 mg via INTRAVENOUS

## 2015-04-19 MED ORDER — FENTANYL CITRATE (PF) 100 MCG/2ML IJ SOLN
INTRAMUSCULAR | Status: DC | PRN
  Administered 2015-04-19: 50 ug via INTRAVENOUS

## 2015-04-19 MED ORDER — FENTANYL CITRATE (PF) 100 MCG/2ML IJ SOLN
INTRAMUSCULAR | Status: AC
  Filled 2015-04-19: qty 2

## 2015-04-19 MED ORDER — LIDOCAINE HCL (PF) 1 % IJ SOLN
INTRAMUSCULAR | Status: AC
  Filled 2015-04-19: qty 30

## 2015-04-19 MED ORDER — MIDAZOLAM HCL 5 MG/5ML IJ SOLN
INTRAMUSCULAR | Status: DC | PRN
  Administered 2015-04-19: 2 mg via INTRAVENOUS

## 2015-04-19 MED ORDER — OXYCODONE HCL ER 15 MG PO T12A
15.0000 mg | EXTENDED_RELEASE_TABLET | Freq: Two times a day (BID) | ORAL | Status: DC
  Administered 2015-04-19 – 2015-04-22 (×7): 15 mg via ORAL
  Filled 2015-04-19 (×8): qty 1

## 2015-04-19 MED ORDER — ONDANSETRON HCL 8 MG PO TABS
8.0000 mg | ORAL_TABLET | Freq: Once | ORAL | Status: AC
  Administered 2015-04-19: 8 mg via ORAL
  Filled 2015-04-19 (×2): qty 1

## 2015-04-19 NOTE — Progress Notes (Signed)
[facility] PROGRESS NOTE Interim History: 64 year old male patient with newly diagnosed multiple myeloma, recently started chemotherapy last week, presented with acute onset of back pain. Workup reveals T12 pathological fracture. Neurosurgery does not recommend surgical intervention but recommend IR consultation for vertebroplasty/kyphoplasty. IR & Oncology consulted.   Assessment/Plan: Acute back pain due to vertebral T12 pathologic fracture: - MRI as below, discussed with neurosurgery recommended no further surgical intervention. - Recommended interventional radiology consultation for possible vertebral plasty/kyphoplasty procedure scheduled on 04/17/2015. - Discussed with radiation oncologist, will receive palliative radiation treatment in 2 weeks. - We'll start long-acting oxycontin. - PT OT consult.  Chronic kidney stage II: - Training seems to be at baseline.  Multiple myeloma: - He was started on Revlimid on 04/18/2015 - Oncology will giveVecade 04/17/2015. - He has vertebral body lesions in L3 T9 seen on MRI.  Essential hypertension: No changes were made to his medication.  Hyperlipidemia: - Vytorin.  Hypo-kalemia: Continue to monitor.   Code Status: Full Family Communication: None at bedside Disposition Plan: Home when medically stable in 2 days   Consultants:  Medical Oncology  Radiation Oncology  IR  Procedures:  Foley catheter-DC'd 4/20  Antibiotics:  None    HPI/Subjective: Relates that his pain is controlled with IV pain medication for 2 hours. Has had multiple bowel movements.  Objective: Filed Vitals:   04/18/15 1005 04/18/15 1400 04/18/15 2136 04/19/15 0622  BP: 122/71 114/65 103/55 94/50  Pulse: 57 67 69 73  Temp: 97.6 F (36.4 C) 97.6 F (36.4 C) 97.5 F (36.4 C) 97.4 F (36.3 C)  TempSrc: Oral Oral Oral Oral  Resp: '18 12 16 16  ' Height:      Weight:      SpO2: 91% 91% 94% 93%    Intake/Output Summary (Last 24  hours) at 04/19/15 0833 Last data filed at 04/18/15 1858  Gross per 24 hour  Intake      0 ml  Output    600 ml  Net   -600 ml   Filed Weights   04/16/15 0557  Weight: 91.627 kg (202 lb)    Exam:  General: Alert, awake, oriented x3, in no acute distress.  HEENT: No bruits, no goiter.  Heart: Regular rate and rhythm. Lungs: Good air movement, clear Abdomen: Soft, nontender, nondistended, positive bowel sounds.  Neuro: Grossly intact, nonfocal.   Data Reviewed: Basic Metabolic Panel:  Recent Labs Lab 04/12/15 1114 04/16/15 0702 04/17/15 0509 04/19/15 0522  NA 138 134* 138 139  K 3.1* 3.0* 3.7 3.1*  CL 111 109 109 107  CO2 '22 20 23 25  ' GLUCOSE 120* 119* 143* 112*  BUN '10 14 15 ' 26*  CREATININE 1.54* 1.53* 1.46* 2.02*  CALCIUM 10.3 10.1 10.5 10.1   Liver Function Tests:  Recent Labs Lab 04/12/15 1114 04/16/15 0702 04/17/15 0509  AST 31 30 36  ALT 48 51 48  ALKPHOS 171* 128* 147*  BILITOT 0.6 0.6 0.7  PROT 9.7* 7.8 8.8*  ALBUMIN 4.8 4.0 4.2   No results for input(s): LIPASE, AMYLASE in the last 168 hours. No results for input(s): AMMONIA in the last 168 hours. CBC:  Recent Labs Lab 04/12/15 0958 04/16/15 0702 04/17/15 0509  WBC 7.5 9.1 8.0  NEUTROABS 5.8 7.1  --   HGB 14.9 13.3 14.6  HCT 42.3 39.1 44.0  MCV 96.1 97.5 100.0  PLT 293 225 263   Cardiac Enzymes: No results for input(s): CKTOTAL, CKMB, CKMBINDEX, TROPONINI in the last 168 hours. BNP (  last 3 results) No results for input(s): BNP in the last 8760 hours.  ProBNP (last 3 results) No results for input(s): PROBNP in the last 8760 hours.  CBG: No results for input(s): GLUCAP in the last 168 hours.  No results found for this or any previous visit (from the past 240 hour(s)).   Studies: No results found.  Scheduled Meds: . acyclovir  400 mg Oral BID  . antiseptic oral rinse  7 mL Mouth Rinse q12n4p  . chlorhexidine  15 mL Mouth Rinse BID  . cyclobenzaprine  5 mg Oral TID  .  ezetimibe-simvastatin  1 tablet Oral QHS  . famotidine  20 mg Oral Daily  . irbesartan  75 mg Oral Daily  . lenalidomide  10 mg Oral Daily  . polyethylene glycol  17 g Oral BID  . potassium chloride  40 mEq Oral Daily  . senna-docusate  2 tablet Oral BID   Continuous Infusions:   Time Spent: 10mn   FCharlynne Cousins [facility] Pager [phone] If 7PM-7AM, please contact night-coverage at [email or web address], password [email or web address]4/22/2016, 8:33 AM  LOS: 3 days

## 2015-04-19 NOTE — Anesthesia Postprocedure Evaluation (Signed)
  Anesthesia Post-op Note  Patient: Eric Dudley  Procedure(s) Performed: Procedure(s):  KYPHOPLASTY  (CASE BEING DONE IN XRAY) (N/A)  Patient Location: PACU  Anesthesia Type:General  Level of Consciousness: awake and alert   Airway and Oxygen Therapy: Patient Spontanous Breathing  Post-op Pain: none  Post-op Assessment: Post-op Vital signs reviewed, Patient's Cardiovascular Status Stable and Respiratory Function Stable  Post-op Vital Signs: Reviewed  Filed Vitals:   04/19/15 1145  BP: 127/78  Pulse: 87  Temp:   Resp: 13    Complications: No apparent anesthesia complications

## 2015-04-19 NOTE — Progress Notes (Signed)
Chemo dosage and dilution checked. Verified with Aldean Baker, RN

## 2015-04-19 NOTE — Anesthesia Procedure Notes (Signed)
Procedure Name: Intubation Date/Time: 04/19/2015 9:57 AM Performed by: Carleene Cooper A Pre-anesthesia Checklist: Patient identified, Emergency Drugs available, Suction available, Patient being monitored and Timeout performed Patient Re-evaluated:Patient Re-evaluated prior to inductionOxygen Delivery Method: Circle system utilized Preoxygenation: Pre-oxygenation with 100% oxygen Intubation Type: IV induction Ventilation: Mask ventilation without difficulty Laryngoscope Size: Glidescope and 4 (Elective Glidescope) Grade View: Grade I Tube type: Oral Tube size: 7.5 mm Number of attempts: 1 Airway Equipment and Method: Video-laryngoscopy Placement Confirmation: ETT inserted through vocal cords under direct vision,  positive ETCO2 and CO2 detector Secured at: 22 cm Tube secured with: Tape Dental Injury: Teeth and Oropharynx as per pre-operative assessment

## 2015-04-19 NOTE — Progress Notes (Signed)
Eric Dudley   DOB:[birth year]   TA#:[id number]   KPV#:[id number]  Subjective: patient underwent T12 kyphoplasty this morning and tolerated it well. His pain got noticeable improvement after the procedure.   Objective:  Filed Vitals:   04/19/15 2031  BP: 106/60  Pulse: 93  Temp: 98.2 F (36.8 C)  Resp: 18    Body mass index is 30.72 kg/(m^2).  Intake/Output Summary (Last 24 hours) at 04/19/15 2355 Last data filed at 04/19/15 1148  Gross per 24 hour  Intake   1450 ml  Output      0 ml  Net   1450 ml     Sclerae unicteric  Oropharynx clear  No peripheral adenopathy  Lungs clear -- no rales or rhonchi  Heart regular rate and rhythm  Abdomen benign  MSK (+) thoracic spinal tenderness, no peripheral edema  Neuro nonfocal   CBG (last 3)  No results for input(s): GLUCAP in the last 72 hours.   Labs:  Lab Results  Component Value Date   WBC 8.0 04/17/2015   HGB 14.6 04/17/2015   HCT 44.0 04/17/2015   MCV 100.0 04/17/2015   PLT 263 04/17/2015   NEUTROABS 7.1 04/16/2015    _0 @  Urine Studies No results for input(s): UHGB, CRYS in the last 72 hours.  Invalid input(s): UACOL, UAPR, USPG, UPH, UTP, UGL, UKET, UBIL, UNIT, UROB, ULEU, UEPI, UWBC, Eric Dudley Eric Dudley, [place]  Basic Metabolic Panel:  Recent Labs Lab 04/16/15 0702 04/17/15 0509 04/19/15 0522  NA 134* 138 139  K 3.0* 3.7 3.1*  CL 109 109 107  CO2 _1 GLUCOSE 119* 143* 112*  BUN 14 15 26*  CREATININE 1.53* 1.46* 2.02*  CALCIUM 10.1 10.5 10.1   GFR Estimated Creatinine Clearance: 40.1 mL/min (by C-G formula based on Cr of 2.02). Liver Function Tests:  Recent Labs Lab 04/16/15 0702 04/17/15 0509  AST 30 36  ALT 51 48  ALKPHOS 128* 147*  BILITOT 0.6 0.7  PROT 7.8 8.8*  ALBUMIN 4.0 4.2   No results for input(s): LIPASE, AMYLASE in the last 168 hours. No results for input(s): AMMONIA in the last 168 hours. Coagulation profile  Recent Labs Lab 04/17/15 0509  INR  1.01    CBC:  Recent Labs Lab 04/16/15 0702 04/17/15 0509  WBC 9.1 8.0  NEUTROABS 7.1  --   HGB 13.3 14.6  HCT 39.1 44.0  MCV 97.5 100.0  PLT 225 263   Cardiac Enzymes: No results for input(s): CKTOTAL, CKMB, CKMBINDEX, TROPONINI in the last 168 hours. BNP: Invalid input(s): POCBNP CBG: No results for input(s): GLUCAP in the last 168 hours. D-Dimer No results for input(s): DDIMER in the last 72 hours. Hgb A1c No results for input(s): HGBA1C in the last 72 hours. Lipid Profile No results for input(s): CHOL, HDL, LDLCALC, TRIG, CHOLHDL, LDLDIRECT in the last 72 hours. Thyroid function studies No results for input(s): TSH, T4TOTAL, T3FREE, THYROIDAB in the last 72 hours.  Invalid input(s): FREET3 Anemia work up No results for input(s): VITAMINB12, FOLATE, FERRITIN, TIBC, IRON, RETICCTPCT in the last 72 hours. Microbiology Recent Results (from the past 240 hour(s))  Surgical pcr screen     Status: None   Collection Time: 04/19/15 12:02 PM  Result Value Ref Range Status   MRSA, PCR NEGATIVE NEGATIVE Final   Staphylococcus aureus NEGATIVE NEGATIVE Final    Comment:        The Xpert SA Assay (FDA approved for NASAL specimens in patients over  55 years of age), is one component of a comprehensive surveillance program.  Test performance has been validated by [facility] for patients greater than or equal to 63 year old. It is not intended to diagnose infection nor to guide or monitor treatment.       Studies:  Ir Kypho Vertebral Thoracic Augmentation  04/19/2015   CLINICAL DATA:  T12 pathologic compression fracture  EXAM: T12 KYPHOPLASTY  FLUOROSCOPY TIME:  5 minutes and 5 seconds  MEDICATIONS AND MEDICAL HISTORY: General endotracheal anesthesia was provided  ANESTHESIA/SEDATION: Not applicable  CONTRAST:  None  PROCEDURE: The procedure, risks, benefits, and alternatives were explained to the patient. Questions regarding the procedure were encouraged and answered.  The patient understands and consents to the procedure.  The back was prepped with Betadine in a sterile fashion, and a sterile drape was applied covering the operative field. A sterile gown and sterile gloves were used for the procedure.  Under fluoroscopic guidance, a 8 gauge needle was inserted into the T12 vertebral body via a left transpedicular approach. A drill was advanced to the anterior third of the vertebral body. A 20 mm balloon was inserted and insufflated. The balloon is positioned in the anterior third and at the midline. The cement trocar was inserted and cement was injected. No extra osseous cement was noted. After cement hardening, the patient was transferred to her bed without complication.  FINDINGS: Images demonstrate needle placement in the T12 vertebral body via a left transpedicular approach. T12 balloon kyphoplasty is documented. The final image demonstrates cement filling the vertebral body, crossing the midline, and extending from the inferior to superior end plate. No extra osseous cement.  IMPRESSION: Successful T12 kyphoplasty.   Electronically Signed   By: Eric Dudley M.D.   On: 04/19/2015 11:37    Assessment: 64 y.o. with newly diagnosed multiple myeloma, just started chemotherapy last week. Presented with worsening back pain.  1. Multiple myeloma, on chemotherapy 2. MM related bone lesions, and T12 compression fracture, s/p kyphoplasty  3. severe back pain secondary to #2  Plan: -spine MRI reviewed -Appreciated consulting neurosurgery and rad/onc -pain improved after kyphoplasty --continue Revlimid at home dose 59m daily -Velcade and dexa 457monce was given this afternoon  -continue pain management, PT/OT -watch for constipation, stool softness and laxatives as needed  I'll follow him when he is in the hospital.   Eric MerleMD 04/19/2015  11:55 PM

## 2015-04-19 NOTE — Anesthesia Preprocedure Evaluation (Addendum)
Anesthesia Evaluation  Patient identified by MRN, date of birth, ID band Patient awake    Reviewed: Allergy & Precautions, H&P , NPO status , Patient's Chart, lab work & pertinent test results  Airway Mallampati: III  TM Distance: >3 FB Neck ROM: Full    Dental no notable dental hx. (+) Teeth Intact, Dental Advisory Given   Pulmonary neg pulmonary ROS, former smoker,  breath sounds clear to auscultation  Pulmonary exam normal       Cardiovascular hypertension, Pt. on medications Rhythm:Regular Rate:Normal     Neuro/Psych negative neurological ROS  negative psych ROS   GI/Hepatic negative GI ROS, Neg liver ROS,   Endo/Other  negative endocrine ROS  Renal/GU Renal InsufficiencyRenal disease  negative genitourinary   Musculoskeletal   Abdominal   Peds  Hematology Multiple myeloma   Anesthesia Other Findings   Reproductive/Obstetrics negative OB ROS                        Anesthesia Physical Anesthesia Plan  ASA: II  Anesthesia Plan: General   Post-op Pain Management:    Induction: Intravenous  Airway Management Planned: Video Laryngoscope Planned and Oral ETT  Additional Equipment:   Intra-op Plan:   Post-operative Plan: Extubation in OR  Informed Consent: I have reviewed the patients History and Physical, chart, labs and discussed the procedure including the risks, benefits and alternatives for the proposed anesthesia with the patient or authorized representative who has indicated his/her understanding and acceptance.   Dental advisory given  Plan Discussed with: CRNA  Anesthesia Plan Comments:       Anesthesia Quick Evaluation  

## 2015-04-19 NOTE — Procedures (Signed)
T12 KP No comp

## 2015-04-19 NOTE — Transfer of Care (Signed)
Immediate Anesthesia Transfer of Care Note  Patient: Eric Dudley  Procedure(s) Performed: Procedure(s):  KYPHOPLASTY  (CASE BEING DONE IN XRAY) (N/A)  Patient Location: PACU  Anesthesia Type:General  Level of Consciousness: awake, alert , oriented and patient cooperative  Airway & Oxygen Therapy: Patient Spontanous Breathing and Patient connected to face mask oxygen  Post-op Assessment: Report given to RN, Post -op Vital signs reviewed and stable and Patient moving all extremities  Post vital signs: Reviewed and stable  Last Vitals:  Filed Vitals:   04/19/15 0622  BP: 94/50  Pulse: 73  Temp: 36.3 C  Resp: 16    Complications: No apparent anesthesia complications

## 2015-04-20 DIAGNOSIS — N179 Acute kidney failure, unspecified: Secondary | ICD-10-CM | POA: Insufficient documentation

## 2015-04-20 MED ORDER — SODIUM CHLORIDE 0.9 % IV SOLN
INTRAVENOUS | Status: AC
  Administered 2015-04-20 (×2): via INTRAVENOUS

## 2015-04-20 NOTE — Progress Notes (Signed)
Referring Physician(s): TRH  Subjective:  T 12 KP in IR 4/22 Has done well Better already Able to move about easier and in much less pain Still some soreness  Allergies: Hydrochlorothiazide; Asa; Hydrocodone; Amoxicillin; and Indapamide  Medications: Prior to Admission medications   Medication Sig Start Date End Date Taking? Authorizing Provider  acetaminophen (TYLENOL) 500 MG tablet Take 1,000 mg by mouth 3 (three) times daily as needed for mild pain or moderate pain.   Yes Historical Provider, MD  acyclovir (ZOVIRAX) 400 MG tablet Take 1 tablet (400 mg total) by mouth 2 (two) times daily. 04/12/15  Yes Truitt Merle, MD  Bisacodyl (DULCOLAX PO) Take 3 capsules by mouth daily as needed (for constipation).    Yes Historical Provider, MD  dexamethasone (DECADRON) 4 MG tablet Take 10 tablets (40 mg total) by mouth once a week. Take 40 mg ( 10 tablets )  Once  A  Week  On  Day  Of  Velcade. 04/12/15  Yes Truitt Merle, MD  DIOVAN 80 MG tablet TAKE 1 TABLET DAILY 06/26/13  Yes Pixie Casino, MD  ezetimibe-simvastatin (VYTORIN) 10-40 MG per tablet Take 1 tablet by mouth at bedtime.   Yes Historical Provider, MD  HYDROcodone-acetaminophen (NORCO/VICODIN) 5-325 MG per tablet Take 1 tablet by mouth every 6 (six) hours as needed for moderate pain. 04/12/15  Yes Truitt Merle, MD  lenalidomide (REVLIMID) 10 MG capsule Take 1 capsule (10 mg total) by mouth daily. Take for 21 days and rest 7 days. ICD-10 is C90.00. 04/05/15  Yes Truitt Merle, MD  Multiple Vitamins-Minerals (CENTRUM SILVER ULTRA MENS PO) Take 1 tablet by mouth daily.   Yes Historical Provider, MD  ondansetron (ZOFRAN) 8 MG tablet Take 1 tablet (8 mg total) by mouth 2 (two) times daily as needed for nausea or vomiting. 04/12/15  Yes Truitt Merle, MD  polyethylene glycol (MIRALAX / GLYCOLAX) packet Take 17 g by mouth daily.   Yes Historical Provider, MD  potassium chloride SA (K-DUR,KLOR-CON) 20 MEQ tablet Take 1 tablet (20 mEq total) by mouth 2 (two) times  daily. 04/12/15  Yes Truitt Merle, MD  [place]   Yes Historical Provider, MD  psyllium (METAMUCIL) 58.6 % powder Take 1 packet by mouth every other day.   Yes Historical Provider, MD  ranitidine (ZANTAC) 150 MG tablet Take 300 mg by mouth at bedtime.    Yes Historical Provider, MD  sennosides-docusate sodium (SENOKOT-S) 8.6-50 MG tablet Take 2 tablets by mouth 2 (two) times daily.   Yes Historical Provider, MD     Vital Signs: BP 113/63 mmHg  Pulse 68  Temp(Src) 97.5 F (36.4 C) (Oral)  Resp 18  Ht _0  (1.727 m)  Wt 91.627 kg (202 lb)  BMI 30.72 kg/m2  SpO2 92%  Physical Exam  Musculoskeletal: Normal range of motion.  Skin:  Site of T 12 KP is NT; no bleeding  clean and dry   Nursing note and vitals reviewed.   Imaging: Mr Thoracic Spine Wo Contrast  04/17/2015   ADDENDUM REPORT: 04/17/2015 09:01  ADDENDUM: The MRI thoracic spine Impression should read: Pathologic compression fracture at T12 in this patient with widespread multiple myeloma, not T8. Findings discussed with Dr. Isidore Moos.   Electronically Signed   By: Rolla Flatten M.D.   On: 04/17/2015 09:01   04/17/2015   CLINICAL DATA:  Patient with multiple myeloma and acute back pain. Symptoms for several weeks, much worse in the past 4 days.  Initial encounter.  EXAM: MRI THORACIC AND LUMBAR SPINE WITHOUT CONTRAST  TECHNIQUE: Multiplanar and multiecho pulse sequences of the thoracic and lumbar spine were obtained without intravenous contrast.  COMPARISON:  CT urogram earlier today.  FINDINGS: MR THORACIC SPINE FINDINGS  Numbering of the thoracic spine was accomplished by counting down from the odontoid. New  Diffuse marrow signal abnormality consistent with myeloma. Minor endplate softening superiorly and inferiorly at T9. There is an acute to subacute compression fracture of T12 vertebral body with moderate BILATERAL endplate can cavity and loss of height estimated 30-50%. Retropulsion of abnormal bone at T12  without conus compression. No other pathologic compression deformities. Focal myeloma deposit midline T9 posteriorly measuring 10 x 10 x 20 mm. No significant mass effect on the spinal cord.  No significant thoracic disc protrusion or spinal stenosis. Mild annular bulging T7-T8. No cord compression at any level. Small layering BILATERAL effusions. Bibasilar atelectasis.  MR LUMBAR SPINE FINDINGS  Diffusely abnormal marrow consistent with the history of myeloma. Focal deposit posterior aspect of L3 on the RIGHT measures 19 x 13 x 15 mm. No pathologic compression deformity. Mild stenosis at L4-5 relates to ordinary spondylosis with annular bulging and facet arthropathy. Focal rightward protrusion at this level narrows the foramen, potentially affecting the L4 nerve root. No obstructive uropathy.  IMPRESSION: MR THORACIC SPINE IMPRESSION  Pathologic compression fracture at T8 in this patient with widespread multiple myeloma. Slight retropulsion of abnormal bone without conus compression.  MR LUMBAR SPINE IMPRESSION  Focal myeloma deposits at L3 and T9 without significant compression deformity.  Mild stenosis at L4-5 related to ordinary spondylosis.  Electronically Signed: By: Rolla Flatten M.D. On: 04/16/2015 20:14   Mr Lumbar Spine Wo Contrast  04/17/2015   ADDENDUM REPORT: 04/17/2015 09:01  ADDENDUM: The MRI thoracic spine Impression should read: Pathologic compression fracture at T12 in this patient with widespread multiple myeloma, not T8. Findings discussed with Dr. Isidore Moos.   Electronically Signed   By: Rolla Flatten M.D.   On: 04/17/2015 09:01   04/17/2015   CLINICAL DATA:  Patient with multiple myeloma and acute back pain. Symptoms for several weeks, much worse in the past 4 days. Initial encounter.  EXAM: MRI THORACIC AND LUMBAR SPINE WITHOUT CONTRAST  TECHNIQUE: Multiplanar and multiecho pulse sequences of the thoracic and lumbar spine were obtained without intravenous contrast.  COMPARISON:  CT urogram  earlier today.  FINDINGS: MR THORACIC SPINE FINDINGS  Numbering of the thoracic spine was accomplished by counting down from the odontoid. New  Diffuse marrow signal abnormality consistent with myeloma. Minor endplate softening superiorly and inferiorly at T9. There is an acute to subacute compression fracture of T12 vertebral body with moderate BILATERAL endplate can cavity and loss of height estimated 30-50%. Retropulsion of abnormal bone at T12 without conus compression. No other pathologic compression deformities. Focal myeloma deposit midline T9 posteriorly measuring 10 x 10 x 20 mm. No significant mass effect on the spinal cord.  No significant thoracic disc protrusion or spinal stenosis. Mild annular bulging T7-T8. No cord compression at any level. Small layering BILATERAL effusions. Bibasilar atelectasis.  MR LUMBAR SPINE FINDINGS  Diffusely abnormal marrow consistent with the history of myeloma. Focal deposit posterior aspect of L3 on the RIGHT measures 19 x 13 x 15 mm. No pathologic compression deformity. Mild stenosis at L4-5 relates to ordinary spondylosis with annular bulging and facet arthropathy. Focal rightward protrusion at this level narrows the foramen, potentially affecting the L4 nerve root. No obstructive uropathy.  IMPRESSION: MR THORACIC SPINE IMPRESSION  Pathologic compression fracture at T8 in this patient with widespread multiple myeloma. Slight retropulsion of abnormal bone without conus compression.  MR LUMBAR SPINE IMPRESSION  Focal myeloma deposits at L3 and T9 without significant compression deformity.  Mild stenosis at L4-5 related to ordinary spondylosis.  Electronically Signed: By: Rolla Flatten M.D. On: 04/16/2015 20:14   Ir Kypho Vertebral Thoracic Augmentation  04/19/2015   CLINICAL DATA:  T12 pathologic compression fracture  EXAM: T12 KYPHOPLASTY  FLUOROSCOPY TIME:  5 minutes and 5 seconds  MEDICATIONS AND MEDICAL HISTORY: General endotracheal anesthesia was provided   ANESTHESIA/SEDATION: Not applicable  CONTRAST:  None  PROCEDURE: The procedure, risks, benefits, and alternatives were explained to the patient. Questions regarding the procedure were encouraged and answered. The patient understands and consents to the procedure.  The back was prepped with Betadine in a sterile fashion, and a sterile drape was applied covering the operative field. A sterile gown and sterile gloves were used for the procedure.  Under fluoroscopic guidance, a 8 gauge needle was inserted into the T12 vertebral body via a left transpedicular approach. A drill was advanced to the anterior third of the vertebral body. A 20 mm balloon was inserted and insufflated. The balloon is positioned in the anterior third and at the midline. The cement trocar was inserted and cement was injected. No extra osseous cement was noted. After cement hardening, the patient was transferred to her bed without complication.  FINDINGS: Images demonstrate needle placement in the T12 vertebral body via a left transpedicular approach. T12 balloon kyphoplasty is documented. The final image demonstrates cement filling the vertebral body, crossing the midline, and extending from the inferior to superior end plate. No extra osseous cement.  IMPRESSION: Successful T12 kyphoplasty.   Electronically Signed   By: [name] M.D.   On: 04/19/2015 11:37    Labs:  CBC:  Recent Labs  04/09/15 1038 04/12/15 0958 04/16/15 0702 04/17/15 0509  WBC 6.1 7.5 9.1 8.0  HGB 14.9 14.9 13.3 14.6  HCT 43.2 42.3 39.1 44.0  PLT 276 293 225 263    COAGS:  Recent Labs  03/27/15 0720 04/17/15 0509  INR 0.98 1.01    BMP:  Recent Labs  04/12/15 1114 04/16/15 0702 04/17/15 0509 04/19/15 0522  NA 138 134* 138 139  K 3.1* 3.0* 3.7 3.1*  CL 111 109 109 107  CO2 _0 GLUCOSE 120* 119* 143* 112*  BUN _1 26*  CALCIUM 10.3 10.1 10.5 10.1  CREATININE 1.54* 1.53* 1.46* 2.02*  GFRNONAA 46* 46* 49* 33*  GFRAA 53*  53* 56* 38*    LIVER FUNCTION TESTS:  Recent Labs  04/09/15 1038 04/12/15 1114 04/16/15 0702 04/17/15 0509  BILITOT 0.49 0.6 0.6 0.7  AST _2 36  ALT 41 48 51 48  ALKPHOS 188* 171* 128* 147*  PROT 8.9* 9.7* 7.8 8.8*  ALBUMIN 4.3 4.8 4.0 4.2    Assessment and Plan:  Doing well post T12 KP Plan per TRH  Signed: Mykiah Schmuck A 04/20/2015, 12:48 PM   I spent a total of 15 Minutes in face to face in clinical consultation/evaluation, greater than 50% of which was counseling/coordinating care for T 12 KP

## 2015-04-20 NOTE — Progress Notes (Signed)
[facility] PROGRESS NOTE Interim History: 64 year old male patient with newly diagnosed multiple myeloma, recently started chemotherapy last week, presented with acute onset of back pain. Workup reveals T12 pathological fracture. Neurosurgery does not recommend surgical intervention but recommend IR consultated for vertebroplasty on 4.22.2016. IR & Oncology consulted.   Assessment/Plan: Acute back pain due to vertebral T12 pathologic fracture: - MRI as below, discussed with neurosurgery recommended no further surgical intervention. - Appreciate Interventional radiology consultation vertebral plasty  on 04/17/2015. - Discussed with radiation oncologist, will receive palliative radiation treatment in 2 weeks. - We'll start long-acting oxycontin. 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 - Hold ARB and vancomycin. - Monitor Cr.  Multiple myeloma: - He was started on Revlimid on 04/18/2015 - Oncology will giveVecade 04/17/2015. - He has vertebral body lesions in L3 T9 seen on MRI.  Essential hypertension: No changes were made to his medication.  Hyperlipidemia: - Vytorin.  Hypo-kalemia: Continue to monitor.   Code Status: Full Family Communication: None at bedside Disposition Plan: Home when medically stable in 2 days   Consultants:  Medical Oncology  Radiation Oncology  IR  Procedures:  Foley catheter-DC'd 4/20  Antibiotics:  None    HPI/Subjective: Relates that his pain is improved after Vertebroplasty.  Objective: Filed Vitals:   04/19/15 1500 04/19/15 1840 04/19/15 2031 04/20/15 0601  BP: 116/71 110/65 106/60 113/63  Pulse: 88 106 93 68  Temp:  97.7 F (36.5 C) 98.2 F (36.8 C) 97.5 F (36.4 C)  TempSrc:  Oral Oral Oral  Resp: '18 22 18 18  ' Height:      Weight:      SpO2: 97% 94% 91% 92%    Intake/Output Summary (Last  24 hours) at 04/20/15 0742 Last data filed at 04/20/15 0300  Gross per 24 hour  Intake   1450 ml  Output    200 ml  Net   1250 ml   Filed Weights   04/16/15 0557  Weight: 91.627 kg (202 lb)    Exam:  General: Alert, awake, oriented x3, in no acute distress.  HEENT: No bruits, no goiter.  Heart: Regular rate and rhythm. Lungs: Good air movement, clear Abdomen: Soft, nontender, nondistended, positive bowel sounds.  Neuro: Grossly intact, nonfocal.   Data Reviewed: Basic Metabolic Panel:  Recent Labs Lab 04/16/15 0702 04/17/15 0509 04/19/15 0522  NA 134* 138 139  K 3.0* 3.7 3.1*  CL 109 109 107  CO2 '20 23 25  ' GLUCOSE 119* 143* 112*  BUN 14 15 26*  CREATININE 1.53* 1.46* 2.02*  CALCIUM 10.1 10.5 10.1   Liver Function Tests:  Recent Labs Lab 04/16/15 0702 04/17/15 0509  AST 30 36  ALT 51 48  ALKPHOS 128* 147*  BILITOT 0.6 0.7  PROT 7.8 8.8*  ALBUMIN 4.0 4.2   No results for input(s): LIPASE, AMYLASE in the last 168 hours. No results for input(s): AMMONIA in the last 168 hours. CBC:  Recent Labs Lab 04/16/15 0702 04/17/15 0509  WBC 9.1 8.0  NEUTROABS 7.1  --   HGB 13.3 14.6  HCT 39.1 44.0  MCV 97.5 100.0  PLT 225 263   Cardiac Enzymes: No results for input(s): CKTOTAL, CKMB, CKMBINDEX, TROPONINI in the last 168 hours. BNP (last 3 results) No results for input(s): BNP in the last 8760 hours.  ProBNP (last 3 results) No results for input(s): PROBNP  in the last 8760 hours.  CBG: No results for input(s): GLUCAP in the last 168 hours.  Recent Results (from the past 240 hour(s))  Surgical pcr screen     Status: None   Collection Time: 04/19/15 12:02 PM  Result Value Ref Range Status   MRSA, PCR NEGATIVE NEGATIVE Final   Staphylococcus aureus NEGATIVE NEGATIVE Final    Comment:        The Xpert SA Assay (FDA approved for NASAL specimens in patients over 28 years of age), is one component of a comprehensive surveillance program.  Test  performance has been validated by [facility] for patients greater than or equal to 38 year old. It is not intended to diagnose infection nor to guide or monitor treatment.      Studies: Ir Kypho Vertebral Thoracic Augmentation  04/19/2015   CLINICAL DATA:  T12 pathologic compression fracture  EXAM: T12 KYPHOPLASTY  FLUOROSCOPY TIME:  5 minutes and 5 seconds  MEDICATIONS AND MEDICAL HISTORY: General endotracheal anesthesia was provided  ANESTHESIA/SEDATION: Not applicable  CONTRAST:  None  PROCEDURE: The procedure, risks, benefits, and alternatives were explained to the patient. Questions regarding the procedure were encouraged and answered. The patient understands and consents to the procedure.  The back was prepped with Betadine in a sterile fashion, and a sterile drape was applied covering the operative field. A sterile gown and sterile gloves were used for the procedure.  Under fluoroscopic guidance, a 8 gauge needle was inserted into the T12 vertebral body via a left transpedicular approach. A drill was advanced to the anterior third of the vertebral body. A 20 mm balloon was inserted and insufflated. The balloon is positioned in the anterior third and at the midline. The cement trocar was inserted and cement was injected. No extra osseous cement was noted. After cement hardening, the patient was transferred to her bed without complication.  FINDINGS: Images demonstrate needle placement in the T12 vertebral body via a left transpedicular approach. T12 balloon kyphoplasty is documented. The final image demonstrates cement filling the vertebral body, crossing the midline, and extending from the inferior to superior end plate. No extra osseous cement.  IMPRESSION: Successful T12 kyphoplasty.   Electronically Signed   By: [name] M.D.   On: 04/19/2015 11:37    Scheduled Meds: . acyclovir  400 mg Oral BID  . antiseptic oral rinse  7 mL Mouth Rinse q12n4p  . chlorhexidine  15 mL Mouth Rinse BID    . cyclobenzaprine  5 mg Oral TID  . ezetimibe-simvastatin  1 tablet Oral QHS  . famotidine  20 mg Oral Daily  . irbesartan  75 mg Oral Daily  . lenalidomide  10 mg Oral Daily  . OxyCODONE  15 mg Oral Q12H  . polyethylene glycol  17 g Oral BID  . potassium chloride  40 mEq Oral Daily  . senna-docusate  2 tablet Oral BID   Continuous Infusions:   Time Spent: 55mn   FCharlynne Cousins [facility] Pager [phone] If 7PM-7AM, please contact night-coverage at [email or web address], password [email or web address]4/23/2016, 7:42 AM  LOS: 4 days

## 2015-04-20 NOTE — Evaluation (Signed)
Physical Therapy Evaluation Patient Details Name: Eric Dudley MRN: [id number] DOB: 1951/10/10 Today's Date: 04/20/2015   History of Present Illness  64 yo male s/p T12 KP 04/19/15. Hx of multiple myeloma, tonsillar cancer, osteoporosis.   Clinical Impression  On eval, pt required Min assist for mobility-able to ambulate ~20 feet with RW. Pain rated 7/10 with activity. Will continue to follow and progress activity as able.     Follow Up Recommendations SNF (depending on progress. Pt states he had discussion with MD already about possible placement.? Could possibly go home if pain and mobility continue to improve)    Equipment Recommendations  Rolling walker with 5" wheels    Recommendations for Other Services OT consult     Precautions / Restrictions Precautions Precautions: Fall Restrictions Weight Bearing Restrictions: No      Mobility  Bed Mobility Overal bed mobility: Needs Assistance Bed Mobility: Supine to Sit;Sit to Sidelying     Supine to sit: Min guard;HOB elevated   Sit to sidelying: Min guard General bed mobility comments: Increased time. close guard for safety  Transfers Overall transfer level: Needs assistance Equipment used: Rolling walker (2 wheeled) Transfers: Sit to/from Stand Sit to Stand: Min assist         General transfer comment: Assist to rise stabilize control descent. VCs safety, technique, hand placement. Highly elevated bed.   Ambulation/Gait Ambulation/Gait assistance: Min assist Ambulation Distance (Feet): 20 Feet Assistive device: Rolling walker (2 wheeled) Gait Pattern/deviations: Step-through pattern;Decreased stride length     General Gait Details: slow gait speed. Moderate reliance on walker for support. Limited distance due to pain.   Stairs            Wheelchair Mobility    Modified Rankin (Stroke Patients Only)       Balance Overall balance assessment: Needs assistance         Standing balance support:  Bilateral upper extremity supported;During functional activity Standing balance-Leahy Scale: Poor                               Pertinent Vitals/Pain Pain Assessment: 0-10 Pain Score: 7  Pain Location: back with activity. 5/10 at rest Pain Descriptors / Indicators: Sore;Sharp;Aching Pain Intervention(s): Monitored during session;Premedicated before session    Home Living Family/patient expects to be discharged to:: Private residence Living Arrangements: Spouse/significant other   Type of Home: House Home Access: Stairs to enter Entrance Stairs-Rails: Right Entrance Stairs-Number of Steps: 3 Home Layout: One level Home Equipment: None      Prior Function Level of Independence: Independent               Hand Dominance        Extremity/Trunk Assessment   Upper Extremity Assessment: Overall WFL for tasks assessed           Lower Extremity Assessment: Generalized weakness      Cervical / Trunk Assessment: Normal  Communication   Communication: No difficulties  Cognition Arousal/Alertness: Awake/alert Behavior During Therapy: WFL for tasks assessed/performed Overall Cognitive Status: Within Functional Limits for tasks assessed                      General Comments      Exercises        Assessment/Plan    PT Assessment Patient needs continued PT services  PT Diagnosis Difficulty walking;Acute pain   PT Problem List Decreased mobility;Decreased balance;Pain;Decreased activity tolerance;Decreased knowledge  of use of DME  PT Treatment Interventions DME instruction;Gait training;Functional mobility training;Therapeutic activities;Therapeutic exercise;Patient/family education;Balance training   PT Goals (Current goals can be found in the Care Plan section) Acute Rehab PT Goals Patient Stated Goal: less pain. regain independence with mobility PT Goal Formulation: With patient Time For Goal Achievement: 05/04/15 Potential to Achieve  Goals: Good    Frequency Min 3X/week   Barriers to discharge        Co-evaluation               End of Session   Activity Tolerance: Patient limited by pain Patient left: in bed;with call bell/phone within reach           Time: 1829-9371 PT Time Calculation (min) (ACUTE ONLY): 16 min   Charges:   PT Evaluation $Initial PT Evaluation Tier I: 1 Procedure     PT G Codes:        Weston Anna, MPT Pager: [phone]

## 2015-04-21 ENCOUNTER — Other Ambulatory Visit: Payer: Self-pay | Admitting: Hematology

## 2015-04-21 DIAGNOSIS — N189 Chronic kidney disease, unspecified: Secondary | ICD-10-CM

## 2015-04-21 DIAGNOSIS — E876 Hypokalemia: Secondary | ICD-10-CM

## 2015-04-21 LAB — BASIC METABOLIC PANEL
ANION GAP: 6 (ref 5–15)
BUN: 20 mg/dL (ref 6–23)
CALCIUM: 9.3 mg/dL (ref 8.4–10.5)
CHLORIDE: 106 mmol/L (ref 96–112)
CO2: 25 mmol/L (ref 19–32)
CREATININE: 1.63 mg/dL — AB (ref 0.50–1.35)
GFR calc Af Amer: 49 mL/min — ABNORMAL LOW (ref >90)
GFR, EST NON AFRICAN AMERICAN: 43 mL/min — AB (ref >90)
Glucose, Bld: 109 mg/dL — ABNORMAL HIGH (ref 70–99)
POTASSIUM: 2.6 mmol/L — AB (ref 3.5–5.1)
Sodium: 137 mmol/L (ref 135–145)

## 2015-04-21 LAB — MAGNESIUM: MAGNESIUM: 2 mg/dL (ref 1.5–2.5)

## 2015-04-21 MED ORDER — POTASSIUM CHLORIDE CRYS ER 10 MEQ PO TBCR
30.0000 meq | EXTENDED_RELEASE_TABLET | ORAL | Status: AC
  Administered 2015-04-21 (×2): 30 meq via ORAL
  Filled 2015-04-21 (×2): qty 1

## 2015-04-21 MED ORDER — SODIUM CHLORIDE 0.9 % IV SOLN
INTRAVENOUS | Status: DC
  Administered 2015-04-21 – 2015-04-22 (×2): via INTRAVENOUS

## 2015-04-21 NOTE — Progress Notes (Signed)
Eric Dudley   DOB:1951/02/22   GY#:[id number]   OJJ#:[id number]  Subjective: patient states his back pain has improved since he started Oxycintin. He was able to walk to his door, and sit at bedside commode early today.  Objective:  Filed Vitals:   04/21/15 1500  BP: 117/60  Pulse: 63  Temp: 97.5 F (36.4 C)  Resp: 16    Body mass index is 30.72 kg/(m^2).  Intake/Output Summary (Last 24 hours) at 04/21/15 1649 Last data filed at 04/21/15 1500  Gross per 24 hour  Intake 2280.8 ml  Output   2700 ml  Net -419.2 ml     Sclerae unicteric  Oropharynx clear  No peripheral adenopathy  Lungs clear -- no rales or rhonchi  Heart regular rate and rhythm  Abdomen benign  MSK (+) thoracic spinal tenderness, no peripheral edema  Neuro nonfocal   CBG (last 3)  No results for input(s): GLUCAP in the last 72 hours.   Labs:  Lab Results  Component Value Date   WBC 8.0 04/17/2015   HGB 14.6 04/17/2015   HCT 44.0 04/17/2015   MCV 100.0 04/17/2015   PLT 263 04/17/2015   NEUTROABS 7.1 04/16/2015    '@LASTCHEMISTRY' @  Urine Studies No results for input(s): UHGB, CRYS in the last 72 hours.  Invalid input(s): UACOL, UAPR, USPG, UPH, UTP, UGL, UKET, UBIL, UNIT, UROB, [place], UEPI, UWBC, [name] [place], [place]  Basic Metabolic Panel:  Recent Labs Lab 04/16/15 0702 04/17/15 0509 04/19/15 0522 04/21/15 0452  NA 134* 138 139 137  K 3.0* 3.7 3.1* 2.6*  CL 109 109 107 106  CO2 '20 23 25 25  ' GLUCOSE 119* 143* 112* 109*  BUN 14 15 26* 20  CREATININE 1.53* 1.46* 2.02* 1.63*  CALCIUM 10.1 10.5 10.1 9.3  MG  --   --   --  2.0   GFR Estimated Creatinine Clearance: 49.7 mL/min (by C-G formula based on Cr of 1.63). Liver Function Tests:  Recent Labs Lab 04/16/15 0702 04/17/15 0509  AST 30 36  ALT 51 48  ALKPHOS 128* 147*  BILITOT 0.6 0.7  PROT 7.8 8.8*  ALBUMIN 4.0 4.2   No results for input(s): LIPASE, AMYLASE in the last 168 hours. No results for input(s): AMMONIA  in the last 168 hours. Coagulation profile  Recent Labs Lab 04/17/15 0509  INR 1.01    CBC:  Recent Labs Lab 04/16/15 0702 04/17/15 0509  WBC 9.1 8.0  NEUTROABS 7.1  --   HGB 13.3 14.6  HCT 39.1 44.0  MCV 97.5 100.0  PLT 225 263   Cardiac Enzymes: No results for input(s): CKTOTAL, CKMB, CKMBINDEX, TROPONINI in the last 168 hours. BNP: Invalid input(s): POCBNP CBG: No results for input(s): GLUCAP in the last 168 hours. D-Dimer No results for input(s): DDIMER in the last 72 hours. Hgb A1c No results for input(s): HGBA1C in the last 72 hours. Lipid Profile No results for input(s): CHOL, HDL, LDLCALC, TRIG, CHOLHDL, LDLDIRECT in the last 72 hours. Thyroid function studies No results for input(s): TSH, T4TOTAL, T3FREE, THYROIDAB in the last 72 hours.  Invalid input(s): FREET3 Anemia work up No results for input(s): VITAMINB12, FOLATE, FERRITIN, TIBC, IRON, RETICCTPCT in the last 72 hours. Microbiology Recent Results (from the past 240 hour(s))  Surgical pcr screen     Status: None   Collection Time: 04/19/15 12:02 PM  Result Value Ref Range Status   MRSA, PCR NEGATIVE NEGATIVE Final   Staphylococcus aureus NEGATIVE NEGATIVE Final  Comment:        The Xpert SA Assay (FDA approved for NASAL specimens in patients over 70 years of age), is one component of a comprehensive surveillance program.  Test performance has been validated by [facility] for patients greater than or equal to 17 year old. It is not intended to diagnose infection nor to guide or monitor treatment.       Studies:  No results found.  Assessment: 64 y.o. with newly diagnosed multiple myeloma, just started chemotherapy last week. Presented with worsening back pain.  1. Multiple myeloma, on chemotherapy 2. MM related bone lesions, and T12 compression fracture, s/p kyphoplasty  3. severe back pain secondary to #2  Plan: -pain improved after kyphoplasty and with pain meds  --continue  Revlimid at home dose 66m daily -He received Velcade and dexa 453mon 4/22, next due on 4/29.  -continue pain management, PT/OT -watch for constipation, stool softness and laxatives as needed  I'll see him back within one week after discharge. He has appointments with [facility].   FeTruitt MerleMD 04/21/2015  4:49 PM

## 2015-04-21 NOTE — Progress Notes (Signed)
[facility] PROGRESS NOTE Interim History: 64 year old male patient with newly diagnosed multiple myeloma, recently started chemotherapy last week, presented with acute onset of back pain. Workup reveals T12 pathological fracture. Neurosurgery does not recommend surgical intervention but recommend IR consultated for vertebroplasty on 4.22.2016. IR & Oncology consulted. S/P T12 kyphoplasty in IR on 4/22   Assessment/Plan: Acute back pain due to vertebral T12 pathologic fracture: - MRI as below, discussed with neurosurgery recommended no further surgical intervention. - Discussed with radiation oncologist, will receive palliative radiation treatment in 2 weeks. - Has been started on long-acting oxycontin-? DC home without. - Pain improved after KP, PT/OT consult-recommend SNF but will have them reassess him in a.m. 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 - Hold ARB and vancomycin. - Monitor Cr. Improved - Follow BMP in a.m.  Multiple myeloma: - He was started on Revlimid on 04/18/2015 - S/P Vecade 04/19/2015. - He has vertebral body lesions in L3 T9 seen on MRI.  Essential hypertension: - ARB held secondary to acute on chronic kidney disease - Controlled  Hyperlipidemia: - Vytorin.  Hypo-kalemia: - Replace and follow   Code Status: Full Family Communication: None at bedside Disposition Plan: Home possibly 4/25 pending PT evaluation   Consultants:  Medical Oncology  Radiation Oncology  IR  Procedures:  Foley catheter-DC'd 4/20  Status post T12 kyphoplasty 4/22  Antibiotics:  None   HPI/Subjective: Back pain much improved post procedure. Able to ambulate in room this morning. Had large BM on 4/21  Objective: Filed Vitals:   04/20/15 0601 04/20/15 1351 04/20/15 2044 04/21/15 0508  BP: 113/63 125/63 117/56 104/54  Pulse: 68 86 62 55   Temp: 97.5 F (36.4 C) 97.6 F (36.4 C) 97.7 F (36.5 C) 97.4 F (36.3 C)  TempSrc: Oral Oral Oral Oral  Resp: '18 16 16 16  ' Height:      Weight:      SpO2: 92% 94% 95% 95%    Intake/Output Summary (Last 24 hours) at 04/21/15 1449 Last data filed at 04/21/15 0556  Gross per 24 hour  Intake 1800.8 ml  Output   1700 ml  Net  100.8 ml   Filed Weights   04/16/15 0557  Weight: 91.627 kg (202 lb)    Exam:  General exam: Pleasant middle-aged male lying comfortably in bed . Respiratory system: Clear. No increased work of breathing. Cardiovascular system: S1 & S2 heard, RRR. No JVD, murmurs, gallops, clicks or pedal edema. Gastrointestinal system: Abdomen is nondistended, soft and nontender. Normal bowel sounds heard. Central nervous system: Alert and oriented. No focal neurological deficits. Extremities: Symmetric 5 x 5 power.   Data Reviewed: Basic Metabolic Panel:  Recent Labs Lab 04/16/15 0702 04/17/15 0509 04/19/15 0522 04/21/15 0452  NA 134* 138 139 137  K 3.0* 3.7 3.1* 2.6*  CL 109 109 107 106  CO2 '20 23 25 25  ' GLUCOSE 119* 143* 112* 109*  BUN 14 15 26* 20  CREATININE 1.53* 1.46* 2.02* 1.63*  CALCIUM 10.1 10.5 10.1 9.3  MG  --   --   --  2.0   Liver Function Tests:  Recent Labs Lab 04/16/15 0702 04/17/15 0509  AST 30 36  ALT 51 48  ALKPHOS 128* 147*  BILITOT 0.6 0.7  PROT 7.8 8.8*  ALBUMIN 4.0 4.2   No results for input(s): LIPASE, AMYLASE in the last  168 hours. No results for input(s): AMMONIA in the last 168 hours. CBC:  Recent Labs Lab 04/16/15 0702 04/17/15 0509  WBC 9.1 8.0  NEUTROABS 7.1  --   HGB 13.3 14.6  HCT 39.1 44.0  MCV 97.5 100.0  PLT 225 263   Cardiac Enzymes: No results for input(s): CKTOTAL, CKMB, CKMBINDEX, TROPONINI in the last 168 hours. BNP (last 3 results) No results for input(s): BNP in the last 8760 hours.  ProBNP (last 3 results) No results for input(s): PROBNP in the last 8760 hours.  CBG: No results  for input(s): GLUCAP in the last 168 hours.  Recent Results (from the past 240 hour(s))  Surgical pcr screen     Status: None   Collection Time: 04/19/15 12:02 PM  Result Value Ref Range Status   MRSA, PCR NEGATIVE NEGATIVE Final   Staphylococcus aureus NEGATIVE NEGATIVE Final    Comment:        The Xpert SA Assay (FDA approved for NASAL specimens in patients over 28 years of age), is one component of a comprehensive surveillance program.  Test performance has been validated by [facility] for patients greater than or equal to 20 year old. It is not intended to diagnose infection nor to guide or monitor treatment.      Studies: No results found.  Scheduled Meds: . acyclovir  400 mg Oral BID  . antiseptic oral rinse  7 mL Mouth Rinse q12n4p  . chlorhexidine  15 mL Mouth Rinse BID  . cyclobenzaprine  5 mg Oral TID  . ezetimibe-simvastatin  1 tablet Oral QHS  . famotidine  20 mg Oral Daily  . lenalidomide  10 mg Oral Daily  . OxyCODONE  15 mg Oral Q12H  . polyethylene glycol  17 g Oral BID  . potassium chloride  30 mEq Oral Q4H  . senna-docusate  2 tablet Oral BID   Continuous Infusions:   Time Spent: 86mn  Eric Goins, MD, FACP, FHM. [facility] Pager [phone] If 7PM-7AM, please contact night-coverage [email or web address] Password [email or web address] 04/21/2015, 2:55 PM    LOS: 5 days

## 2015-04-22 LAB — BASIC METABOLIC PANEL
Anion gap: 8 (ref 5–15)
Anion gap: 6 (ref 5–15)
BUN: 15 mg/dL (ref 6–23)
BUN: 11 mg/dL (ref 6–23)
CALCIUM: 9.4 mg/dL (ref 8.4–10.5)
CALCIUM: 9.3 mg/dL (ref 8.4–10.5)
CO2: 26 mmol/L (ref 19–32)
CO2: 27 mmol/L (ref 19–32)
CREATININE: 1.65 mg/dL — AB (ref 0.50–1.35)
Chloride: 106 mmol/L (ref 96–112)
Chloride: 105 mmol/L (ref 96–112)
Creatinine, Ser: 1.52 mg/dL — ABNORMAL HIGH (ref 0.50–1.35)
GFR calc Af Amer: 49 mL/min — ABNORMAL LOW (ref >90)
GFR calc non Af Amer: 42 mL/min — ABNORMAL LOW (ref >90)
GFR, EST AFRICAN AMERICAN: 54 mL/min — AB (ref >90)
GFR, EST NON AFRICAN AMERICAN: 46 mL/min — AB (ref >90)
Glucose, Bld: 111 mg/dL — ABNORMAL HIGH (ref 70–99)
Glucose, Bld: 103 mg/dL — ABNORMAL HIGH (ref 70–99)
POTASSIUM: 2.8 mmol/L — AB (ref 3.5–5.1)
Potassium: 2.6 mmol/L — CL (ref 3.5–5.1)
SODIUM: 138 mmol/L (ref 135–145)
Sodium: 140 mmol/L (ref 135–145)

## 2015-04-22 LAB — CBC
HCT: 42 % (ref 39.0–52.0)
Hemoglobin: 14.1 g/dL (ref 13.0–17.0)
MCH: 33.1 pg (ref 26.0–34.0)
MCHC: 33.6 g/dL (ref 30.0–36.0)
MCV: 98.6 fL (ref 78.0–100.0)
Platelets: 219 10*3/uL (ref 150–400)
RBC: 4.26 MIL/uL (ref 4.22–5.81)
RDW: 12.8 % (ref 11.5–15.5)
WBC: 9.2 10*3/uL (ref 4.0–10.5)

## 2015-04-22 MED ORDER — BISACODYL 10 MG RE SUPP
10.0000 mg | Freq: Once | RECTAL | Status: AC
  Administered 2015-04-22: 10 mg via RECTAL
  Filled 2015-04-22: qty 1

## 2015-04-22 MED ORDER — POTASSIUM CHLORIDE 20 MEQ/15ML (10%) PO SOLN
40.0000 meq | ORAL | Status: DC
  Administered 2015-04-22 – 2015-04-23 (×3): 40 meq via ORAL
  Filled 2015-04-22 (×4): qty 30

## 2015-04-22 MED ORDER — POTASSIUM CHLORIDE CRYS ER 20 MEQ PO TBCR
30.0000 meq | EXTENDED_RELEASE_TABLET | ORAL | Status: AC
  Administered 2015-04-22 (×3): 30 meq via ORAL
  Filled 2015-04-22 (×3): qty 1

## 2015-04-22 MED ORDER — OXYCODONE HCL 5 MG PO TABS: 10.0000 mg | ORAL_TABLET | ORAL | Status: DC | PRN

## 2015-04-22 MED ORDER — POTASSIUM CHLORIDE 10 MEQ/100ML IV SOLN
10.0000 meq | INTRAVENOUS | Status: DC
  Filled 2015-04-22 (×6): qty 100

## 2015-04-22 NOTE — Consult Note (Signed)
Physical Medicine and Rehabilitation Consult Reason for Consult: Vertebral T 12 pathologic fracture status post kyphoplasty with history of multiple myeloma Referring Physician: [facility]   HPI: Eric Dudley is a 64 y.o. right handed male with history of multiple myeloma with chemotherapy, tonsillar cancer. Patient lives with spouse independent prior to admission. Presented 04/16/2015 with increasing back pain. CT MRI showed pathologic compression fracture at T 12 as well as focal myeloma deposits at L3 and T9 without significant compression deformity. Underwent kyphoplasty 04/19/2015 per interventional radiology. Hospital course pain management. Follow-up oncology services continues on Revlimid as advised. Bouts of constipation with laxative assistance. Physical therapy completed in ongoing. M.D. has requested physical medicine rehabilitation consult.   Review of Systems  Gastrointestinal: Positive for constipation.  Musculoskeletal: Positive for back pain.  Neurological: Positive for weakness.  All other systems reviewed and are negative.  Past Medical History  Diagnosis Date  . Hypertension   . Dyslipidemia   . Tonsillar cancer 2006  . Cholelithiases    Past Surgical History  Procedure Laterality Date  . Appendectomy  1962  . Shoulder surgery  1999    left  . Knee surgery  2012    right   Family History  Problem Relation Age of Onset  . Cancer Mother 67    cervical cancer   . Cancer Father     unknown cancer   . Hypertension Sister   . Cancer Sister     melanoma    Social History:  reports that he quit smoking about 11 years ago. His smoking use included Cigars. He quit smokeless tobacco use about 20 years ago. His smokeless tobacco use included Chew. He reports that he drinks alcohol. He reports that he does not use illicit drugs. Allergies:  Allergies  Allergen Reactions  . Hydrochlorothiazide Rash and Palpitations  . Asa [Aspirin] Nausea Only  .  Hydrocodone Itching and Other (See Comments)    Jittery, hyperactivity   . Amoxicillin Itching and Rash  . Indapamide Palpitations    Lightheadedness, dizziness   Medications Prior to Admission  Medication Sig Dispense Refill  . acetaminophen (TYLENOL) 500 MG tablet Take 1,000 mg by mouth 3 (three) times daily as needed for mild pain or moderate pain.    [name] acyclovir (ZOVIRAX) 400 MG tablet Take 1 tablet (400 mg total) by mouth 2 (two) times daily. 180 tablet 1  . Bisacodyl (DULCOLAX PO) Take 3 capsules by mouth daily as needed (for constipation).     [name] dexamethasone (DECADRON) 4 MG tablet Take 10 tablets (40 mg total) by mouth once a week. Take 40 mg ( 10 tablets )  Once  A  Week  On  Day  Of  Velcade. 120 tablet 1  . DIOVAN 80 MG tablet TAKE 1 TABLET DAILY 90 tablet 0  . ezetimibe-simvastatin (VYTORIN) 10-40 MG per tablet Take 1 tablet by mouth at bedtime.    [name] HYDROcodone-acetaminophen (NORCO/VICODIN) 5-325 MG per tablet Take 1 tablet by mouth every 6 (six) hours as needed for moderate pain. 60 tablet 0  . lenalidomide (REVLIMID) 10 MG capsule Take 1 capsule (10 mg total) by mouth daily. Take for 21 days and rest 7 days. ICD-10 is C90.00. 21 capsule 0  . Multiple Vitamins-Minerals (CENTRUM SILVER ULTRA MENS PO) Take 1 tablet by mouth daily.    . ondansetron (ZOFRAN) 8 MG tablet Take 1 tablet (8 mg total) by mouth 2 (two) times daily as needed for nausea or vomiting.  60 tablet 1  . polyethylene glycol (MIRALAX / GLYCOLAX) packet Take 17 g by mouth daily.    . potassium chloride SA (K-DUR,KLOR-CON) 20 MEQ tablet Take 1 tablet (20 mEq total) by mouth 2 (two) times daily. 90 tablet 1  . PRESCRIPTION MEDICATION Chemo - [facility]    . psyllium (METAMUCIL) 58.6 % powder Take 1 packet by mouth every other day.    . ranitidine (ZANTAC) 150 MG tablet Take 300 mg by mouth at bedtime.     . sennosides-docusate sodium (SENOKOT-S) 8.6-50 MG tablet Take 2 tablets by mouth 2 (two) times daily.       Home: Home Living Family/patient expects to be discharged to:: Private residence Living Arrangements: Spouse/significant other Type of Home: House Home Access: Stairs to enter Technical brewer of Steps: 3 Entrance Stairs-Rails: Right Home Layout: One level Home Equipment: None  Functional History: Prior Function Level of Independence: Independent Functional Status:  Mobility: Bed Mobility Overal bed mobility: Needs Assistance Bed Mobility: Rolling Rolling: Min guard Supine to sit: Min guard, HOB elevated Sit to sidelying: Mod assist General bed mobility comments: Increased time. multimodal cues for back precautions,, Patient raised the [facility] to facilitate sitting up, patient required  assist for legs  onto bed after going to sidelying, again used HOB elevation for trunk support. Transfers Overall transfer level: Needs assistance Equipment used: Rolling walker (2 wheeled), 4-wheeled walker Transfers: Sit to/from Stand Sit to Stand: Min assist General transfer comment: Assist to rise stabilize control descent. VCs safety, technique, hand placement. Highly elevated bed.  Ambulation/Gait Ambulation/Gait assistance: Min assist Ambulation Distance (Feet): 100 Feet (then 68' with $ wheeled.) Assistive device: Rolling walker (2 wheeled), 4-wheeled walker Gait Pattern/deviations: Step-through pattern, Trunk flexed, Decreased stride length General Gait Details: slow gait speed. Moderate reliance on walker for support.  Cues for posture, stopped  for break standing.Limited distance due to pain.     ADL:    Cognition: Cognition Overall Cognitive Status: Within Functional Limits for tasks assessed Orientation Level: Oriented X4 Cognition Arousal/Alertness: Awake/alert Behavior During Therapy: Anxious, WFL for tasks assessed/performed Overall Cognitive Status: Within Functional Limits for tasks assessed  Blood pressure 113/62, pulse 63, temperature 97.7 F (36.5 C),  temperature source Oral, resp. rate 16, height '5\' 8"'  (1.727 m), weight 91.627 kg (202 lb), SpO2 92 %. Physical Exam  Vitals reviewed. Constitutional: He is oriented to person, place, and time.  HENT:  Head: Normocephalic.  Eyes: EOM are normal.  Neck: Normal range of motion. Neck supple. No thyromegaly present.  Cardiovascular: Normal rate and regular rhythm.   Respiratory: Effort normal and breath sounds normal. No respiratory distress.  GI: Soft. Bowel sounds are normal. He exhibits no distension.  Neurological: He is alert and oriented to person, place, and time.  Skin: Skin is warm and dry.    Results for orders placed or performed during the hospital encounter of 04/16/15 (from the past 24 hour(s))  Basic metabolic panel     Status: Abnormal   Collection Time: 04/22/15  4:08 AM  Result Value Ref Range   Sodium 140 135 - 145 mmol/L   Potassium 2.8 (L) 3.5 - 5.1 mmol/L   Chloride 106 96 - 112 mmol/L   CO2 26 19 - 32 mmol/L   Glucose, Bld 111 (H) 70 - 99 mg/dL   BUN 15 6 - 23 mg/dL   Creatinine, Ser 1.65 (H) 0.50 - 1.35 mg/dL   Calcium 9.4 8.4 - 10.5 mg/dL   GFR calc non Af Wyvonnia Lora  42 (L) >90 mL/min   GFR calc Af Amer 49 (L) >90 mL/min   Anion gap 8 5 - 15  CBC     Status: None   Collection Time: 04/22/15  4:08 AM  Result Value Ref Range   WBC 9.2 4.0 - 10.5 K/uL   RBC 4.26 4.22 - 5.81 MIL/uL   Hemoglobin 14.1 13.0 - 17.0 g/dL   HCT 42.0 39.0 - 52.0 %   MCV 98.6 78.0 - 100.0 fL   MCH 33.1 26.0 - 34.0 pg   MCHC 33.6 30.0 - 36.0 g/dL   RDW 12.8 11.5 - 15.5 %   Platelets 219 150 - 400 K/uL   No results found.  Assessment/Plan: Diagnosis: Pathologic T12 fx due to multiple myeloma 1. Does the need for close, 24 hr/day medical supervision in concert with the patient's rehab needs make it unreasonable for this patient to be served in a less intensive setting? Potentially 2. Co-Morbidities requiring supervision/potential complications: pain, ckd,  3. Due to bladder management,  bowel management, safety, skin/wound care, disease management, medication administration, pain management and patient education, does the patient require 24 hr/day rehab nursing? No 4. Does the patient require coordinated care of a physician, rehab nurse, PT (1-2 hrs/day, 5 days/week) and OT (1-2 hrs/day, 5 days/week) to address physical and functional deficits in the context of the above medical diagnosis(es)? No, likely Addressing deficits in the following areas: balance, endurance, locomotion, strength, transferring, bowel/bladder control, bathing, dressing, feeding, grooming, toileting and psychosocial support 5. Can the patient actively participate in an intensive therapy program of at least 3 hrs of therapy per day at least 5 days per week? Potentially 6. The potential for patient to make measurable gains while on inpatient rehab is fair 7. Anticipated functional outcomes upon discharge from inpatient rehab are n/a  with PT, n/a with OT, n/a with SLP. 8. Estimated rehab length of stay to reach the above functional goals is:   9. Does the patient have adequate social supports and living environment to accommodate these discharge functional goals? Yes and Potentially 10. Anticipated D/C setting: Home 11. Anticipated post D/C treatments: HH therapy and Outpatient therapy 12. Overall Rehab/Functional Prognosis: good  RECOMMENDATIONS: This patient's condition is appropriate for continued rehabilitative care in the following setting: Home health vs Outpt therapies.  Patient has agreed to participate in recommended program. N/A Note that insurance prior authorization may be required for reimbursement for recommended care.  Comment:  Pt is at supervision level in the room to bathroom etc. I would expect continued functional progression on acute while medical issues are being managed. Probably will not require an inpatient rehab stay. Will follow along in case patient doesn't progress as expected.    Meredith Staggers, MD, [facility] 04/23/2015   04/22/2015

## 2015-04-22 NOTE — Clinical Social Work Note (Signed)
Clinical Social Work Assessment  Patient Details  Name: Eric Dudley MRN: [id number] Date of Birth: 09/03/1951  Date of referral:  04/22/15               Reason for consult:  Facility Placement                Permission sought to share information with:  Other (SNF facilities) Permission granted to share information::  Yes, Verbal Permission Granted  Name::     Nelie Fariss/wife  Agency::   [facility] SNF search)  Relationship::  wife  Contact Information:   [phone]  Housing/Transportation Living arrangements for the past 2 months:  [place] of Information:  Patient, Spouse Patient Interpreter Needed:  None Criminal Activity/Legal Involvement Pertinent to Current Situation/Hospitalization:  No - Comment as needed Significant Relationships:  Spouse Lives with:  Spouse Do you feel safe going back to the place where you live?  No Need for family participation in patient care:  Yes (Comment)  Care giving concerns:  CSW received consult for rehab at [facility]. Pt expresses concern about pt wife being able to provide the care he needs at home at this time.    Social Worker assessment / plan:  CSW received referral that PT recommending CIR, but SNF needed as secondary option. CSW met with pt and pt wife at bedside. CSW introduced self and explained role. Pt reports that he lives at home with pt wife, but concerned that pt wife cannot provide the level of care that pt needs at this time. CSW discussed with pt that PT recommended CIR and CIR will evaluate pt to determine appropriateness for CIR. CSW discussed that SNF rehab search is recommended as secondary option to CIR. Pt and pt wife expressed understanding. Pt stated that he is receiving oral chemotherapy and comes to [facility] every Friday for an injection. Pt and pt wife expressed preference would be CIR, but they are agreeable to [facility] search to have secondary options as pt is sure that he will need rehab post  discharge.  CSW completed FL2 and initiated SNF search to [facility].  CSW to follow up with pt and pt wife re: SNF bed offers and also follow recommendations from CIR evaluation.  CSW to continue to follow to provide support and assist with pt disposition needs.   Employment status:  Retired Engineer, technical sales PT Recommendations:  Inpatient Independence, [place] / Referral to community resources:  Acute Rehab, [place]  Patient/Family's Response to care: Pt recognizes need for continued rehab post discharge in order to get stronger and be safe with pt wife at home.   Patient/Family's Understanding of and Emotional Response to Diagnosis, Current Treatment, and Prognosis:  Pt coping appropriately with current diagnosis and treatment. Pt understands need to get stronger to be safe at home. Pt anticipates short term at rehab and hopeful for CIR.   Emotional Assessment Appearance:  Appears stated age, Well-Groomed Attitude/Demeanor/Rapport:    Affect (typically observed):  Accepting, Appropriate, Pleasant Orientation:  Oriented to Self, Oriented to Place, Oriented to  Time, Oriented to Situation Alcohol / Substance use:  Not Applicable Psych involvement (Current and /or in the community):  No (Comment)  Discharge Needs  Concerns to be addressed:  Discharge Planning Concerns Readmission within the last 30 days:  No Current discharge risk:  Dependent with Mobility Barriers to Discharge:  Continued Medical Work up   [place], [place],  LCSW 04/22/2015, 10:47 AM  [phone]

## 2015-04-22 NOTE — Progress Notes (Signed)
Physical Therapy Treatment Patient Details Name: Eric Dudley MRN: [id number] DOB: December 04, 1951 Today's Date: 04/22/2015    History of Present Illness 64 yo male s/p T12 KP 04/19/15. Hx of multiple myeloma, tonsillar cancer, osteoporosis.     PT Comments    Patient requiring assist for bed mobility, uses HOB feature to assist in sitting and going back to supine. Patient reports that he prefers to go to rehab as  Wife will not be able to provide  Lifting assist,  Also has chemo every Friday. Recommend CIR consult, if not a candidate, then SNF  Follow Up Recommendations  CIR     Equipment Recommendations  Rolling walker with 5" wheels (may benefit from 4 wheeled pt has carpeted floors)    Recommendations for Other Services       Precautions / Restrictions Precautions Precautions: Fall;Back    Mobility  Bed Mobility Overal bed mobility: Needs Assistance Bed Mobility: Rolling Rolling: Min guard   Supine to sit: Min guard;HOB elevated   Sit to sidelying: Mod assist General bed mobility comments: Increased time. multimodal cues for back precautions,, Patient raised the [facility] to facilitate sitting up, patient required  assist for legs  onto bed after going to sidelying, again used HOB elevation for trunk support.  Transfers Overall transfer level: Needs assistance Equipment used: Rolling walker (2 wheeled);4-wheeled walker Transfers: Sit to/from Stand Sit to Stand: Min assist         General transfer comment: Assist to rise stabilize control descent. VCs safety, technique, hand placement. Highly elevated bed.   Ambulation/Gait Ambulation/Gait assistance: Min assist Ambulation Distance (Feet): 100 Feet (then 16' with $ wheeled.) Assistive device: Rolling walker (2 wheeled);4-wheeled walker Gait Pattern/deviations: Step-through pattern;Trunk flexed;Decreased stride length     General Gait Details: slow gait speed. Moderate reliance on walker for support.  Cues for posture,  stopped  for break standing.Limited distance due to pain.    Stairs            Wheelchair Mobility    Modified Rankin (Stroke Patients Only)       Balance Overall balance assessment: Needs assistance Sitting-balance support: Feet supported   Sitting balance - Comments: relies  on UE's  while sitting, unable to freely sit  withou UE support.     Standing balance-Leahy Scale: Poor Standing balance comment: relies heavily on UE's                    Cognition Arousal/Alertness: Awake/alert Behavior During Therapy: Anxious;WFL for tasks assessed/performed Overall Cognitive Status: Within Functional Limits for tasks assessed                      Exercises      General Comments        Pertinent Vitals/Pain Pain Score: 10-Worst pain ever Pain Location: back and both  antero-lateral chest/ribs. Pain Descriptors / Indicators: Constant;Cramping;Grimacing;Guarding;Jabbing Pain Intervention(s): Limited activity within patient's tolerance;Monitored during session;Repositioned;Patient requesting pain meds-RN notified    Home Living                      Prior Function            PT Goals (current goals can now be found in the care plan section) Progress towards PT goals: Progressing toward goals    Frequency  Min 3X/week    PT Plan Discharge plan needs to be updated    Co-evaluation  End of Session   Activity Tolerance: No increased pain Patient left: in bed;with call bell/phone within reach;with SCD's reapplied;with family/visitor present     Time: 3225-6720 PT Time Calculation (min) (ACUTE ONLY): 27 min  Charges:  $Gait Training: 23-37 mins                    G Codes:      Claretha Cooper 04/22/2015, 9:58 AM Tresa Endo PT [phone]

## 2015-04-22 NOTE — Progress Notes (Signed)
CRITICAL VALUE ALERT  Critical value received:  K+ 2.6  Date of notification:  04/21/17  Time of notification:  2641  Critical value read back:Yes.    Nurse who received alert:  Laural Benes  MD notified (1st page):  Dr. Algis Liming  Time of first page:  1730  MD notified (2nd page):  Time of second page:  Responding MD:  Dr. Algis Liming  Time MD responded:  [phone]

## 2015-04-22 NOTE — Progress Notes (Signed)
OT Cancellation Note  Patient Details Name: Eric Dudley MRN: [id number] DOB: 1951-10-04   Cancelled Treatment:    Reason Eval/Treat Not Completed: Fatigue/lethargy limiting ability to participate Pt just finished using bed pan and was worn out.  Pt agreed to to OT later this day or next day.  Betsy Pries 04/22/2015, 1:22 PM

## 2015-04-22 NOTE — Progress Notes (Signed)
Addendum  Patient remains severely hypokalemic despite aggressive potassium replacement (received 90 meq today and potassium is 2.6). Discussed with nephrologist on call who suspects myeloma related to blob of the and potassium wasting. Recommends potassium chloride liquid 40 meq every 4 hours 4 doses and follow BMP in a.m. If potassium continues to be persistently low, formal nephrology consultation in a.m.  Vernell Leep, MD, FACP, FHM. [facility] Pager [phone]  If 7PM-7AM, please contact night-coverage [email or web address] Password [facility] 04/22/2015, 7:19 PM

## 2015-04-22 NOTE — Progress Notes (Signed)
Rehab Admissions Coordinator Note:  Patient was screened by Cleatrice Burke for appropriateness for an Inpatient Acute Rehab Consult per PT recommendation.  At this time, we are recommending Inpatient Rehab consult if  Pt and MD would like to assess if pt would be a candidate for an inpt rehab stay.   Cleatrice Burke 04/22/2015, 10:57 AM  I can be reached at [phone].

## 2015-04-22 NOTE — Clinical Social Work Placement (Signed)
   CLINICAL SOCIAL WORK PLACEMENT  NOTE  Date:  04/22/2015  Patient Details  Name: Eric Dudley MRN: [id number] Date of Birth: 1951-10-18  Clinical Social Work is seeking post-discharge placement for this patient at the [place] level of care (*CSW will initial, date and re-position this form in  chart as items are completed):  Yes   Patient/family provided with [facility] Work Department's list of facilities offering this level of care within the geographic area requested by the patient (or if unable, by the patient's family).  Yes   Patient/family informed of their freedom to choose among providers that offer the needed level of care, that participate in Medicare, Medicaid or managed care program needed by the patient, have an available bed and are willing to accept the patient.  Yes   Patient/family informed of [facility]'s ownership interest in [facility] and [facility], as well as of the fact that they are under no obligation to receive care at these facilities.  PASRR submitted to EDS on 04/22/15     PASRR number received on 04/22/15     Existing PASRR number confirmed on       FL2 transmitted to all facilities in geographic area requested by pt/family on 04/22/15     FL2 transmitted to all facilities within larger geographic area on       Patient informed that his/her managed care company has contracts with or will negotiate with certain facilities, including the following:            Patient/family informed of bed offers received.  Patient chooses bed at       Physician recommends and patient chooses bed at      Patient to be transferred to   on  .  Patient to be transferred to facility by       Patient family notified on   of transfer.  Name of family member notified:        PHYSICIAN Please sign FL2     Additional Comment:    _______________________________________________ Ladell Pier, LCSW 04/22/2015,  11:00 AM  [phone]

## 2015-04-22 NOTE — Progress Notes (Signed)
[facility] PROGRESS NOTE Interim History: 64 year old male patient with newly diagnosed multiple myeloma, recently started chemotherapy last week, presented with acute onset of back pain. Workup reveals T12 pathological fracture. Neurosurgery does not recommend surgical intervention but recommend IR consultated for vertebroplasty on 4.22.2016. IR & Oncology consulted. S/P T12 kyphoplasty in IR on 4/22. PT reevaluated 4/25 and recommended CIR consult- pending.   Assessment/Plan: Acute back pain due to vertebral T12 pathologic fracture: - MRI as below, discussed with neurosurgery recommended no further surgical intervention. - Discussed with radiation oncologist, will receive palliative radiation treatment in 2 weeks. - Has been started on long-acting oxycontin-? DC home without. - Pain improved after KP, PT/OT consult-recommend SNF but will have them reassess him in a.m. 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 - Hold ARB and vancomycin. - AKI resolved - Creatinine at baseline 1.6  Multiple myeloma: - He was started on Revlimid home dose on 04/18/2015 - S/P Vecade & Dexa 40 mg on 04/19/2015. - He has vertebral body lesions in L3 T9 seen on MRI.  Essential hypertension: - ARB held secondary to acute on chronic kidney disease - Controlled - Resume ARB at DC and periodic OP BMP follow up.  Hyperlipidemia: - Vytorin.  Hypo-kalemia: - Replace aggressively and follow  Constipation - bowel regimen - multifactorial   Code Status: Full Family Communication: None at bedside Disposition Plan: CIR Vs SNF pending CIR consultation.   Consultants:  Medical Oncology  Radiation Oncology  IR  CIR  Procedures:  Foley catheter-DC'd 4/20  Status post T12 kyphoplasty  4/22  Antibiotics:  None   HPI/Subjective: Back pain much improved post procedure- now only soreness. Able to ambulate in room. Had large BM on 4/21 but none since  Objective: Filed Vitals:   04/21/15 1500 04/21/15 2015 04/22/15 0456 04/22/15 1334  BP: 117/60 132/68 113/62 135/75  Pulse: 63 67 63 89  Temp: 97.5 F (36.4 C) 97.5 F (36.4 C) 97.7 F (36.5 C) 97.9 F (36.6 C)  TempSrc: Oral Oral Oral Oral  Resp: _0 Height:      Weight:      SpO2: 96% 95% 92% 94%    Intake/Output Summary (Last 24 hours) at 04/22/15 1517 Last data filed at 04/22/15 1300  Gross per 24 hour  Intake 1648.5 ml  Output   4300 ml  Net -2651.5 ml   Filed Weights   04/16/15 0557  Weight: 91.627 kg (202 lb)    Exam:  General exam: Pleasant middle-aged male lying comfortably in bed . Respiratory system: Clear. No increased work of breathing. Cardiovascular system: S1 & S2 heard, RRR. No JVD, murmurs, gallops, clicks or pedal edema. Gastrointestinal system: Abdomen is nondistended, soft and nontender. Normal bowel sounds heard. Central nervous system: Alert and oriented. No focal neurological deficits. Extremities: Symmetric 5 x 5 power. MSS: no acute findings at [place] site.   Data Reviewed: Basic Metabolic Panel:  Recent Labs Lab 04/16/15 0702 04/17/15 0509 04/19/15 0522 04/21/15 0452 04/22/15 0408  NA 134* 138 139 137 140  K 3.0* 3.7 3.1* 2.6* 2.8*  CL 109 109 107 106 106  CO2 _1 GLUCOSE 119* 143* 112* 109* 111*  BUN 14 15 26* 20 15  CREATININE 1.53* 1.46* 2.02* 1.63* 1.65*  CALCIUM 10.1 10.5 10.1 9.3 9.4  MG  --   --   --  2.0  --    Liver Function Tests:  Recent Labs Lab 04/16/15 0702 04/17/15 0509  AST 30 36  ALT 51 48  ALKPHOS 128* 147*  BILITOT 0.6 0.7  PROT 7.8 8.8*  ALBUMIN 4.0 4.2   No results for input(s): LIPASE, AMYLASE in the last 168 hours. No results for input(s): AMMONIA in the last 168 hours. CBC:  Recent Labs Lab  04/16/15 0702 04/17/15 0509 04/22/15 0408  WBC 9.1 8.0 9.2  NEUTROABS 7.1  --   --   HGB 13.3 14.6 14.1  HCT 39.1 44.0 42.0  MCV 97.5 100.0 98.6  PLT 225 263 219   Cardiac Enzymes: No results for input(s): CKTOTAL, CKMB, CKMBINDEX, TROPONINI in the last 168 hours. BNP (last 3 results) No results for input(s): BNP in the last 8760 hours.  ProBNP (last 3 results) No results for input(s): PROBNP in the last 8760 hours.  CBG: No results for input(s): GLUCAP in the last 168 hours.  Recent Results (from the past 240 hour(s))  Surgical pcr screen     Status: None   Collection Time: 04/19/15 12:02 PM  Result Value Ref Range Status   MRSA, PCR NEGATIVE NEGATIVE Final   Staphylococcus aureus NEGATIVE NEGATIVE Final    Comment:        The Xpert SA Assay (FDA approved for NASAL specimens in patients over 6 years of age), is one component of a comprehensive surveillance program.  Test performance has been validated by [facility] for patients greater than or equal to 80 year old. It is not intended to diagnose infection nor to guide or monitor treatment.      Studies: No results found.  Scheduled Meds: . acyclovir  400 mg Oral BID  . antiseptic oral rinse  7 mL Mouth Rinse q12n4p  . chlorhexidine  15 mL Mouth Rinse BID  . cyclobenzaprine  5 mg Oral TID  . ezetimibe-simvastatin  1 tablet Oral QHS  . famotidine  20 mg Oral Daily  . lenalidomide  10 mg Oral Daily  . OxyCODONE  15 mg Oral Q12H  . polyethylene glycol  17 g Oral BID  . senna-docusate  2 tablet Oral BID   Continuous Infusions:   Time Spent: 65mn  Eric Huttner, MD, FACP, FHM. [facility] Pager [phone] If 7PM-7AM, please contact night-coverage [email or web address] Password [email or web address] 04/22/2015, 3:17 PM    LOS: 6 days

## 2015-04-23 ENCOUNTER — Other Ambulatory Visit: Payer: TRICARE For Life (TFL)

## 2015-04-23 ENCOUNTER — Other Ambulatory Visit: Payer: Self-pay | Admitting: Hematology

## 2015-04-23 ENCOUNTER — Ambulatory Visit: Payer: TRICARE For Life (TFL) | Admitting: Hematology

## 2015-04-23 ENCOUNTER — Ambulatory Visit: Payer: TRICARE For Life (TFL)

## 2015-04-23 DIAGNOSIS — G9341 Metabolic encephalopathy: Secondary | ICD-10-CM | POA: Diagnosis not present

## 2015-04-23 DIAGNOSIS — Z5112 Encounter for antineoplastic immunotherapy: Secondary | ICD-10-CM | POA: Diagnosis present

## 2015-04-23 DIAGNOSIS — R55 Syncope and collapse: Secondary | ICD-10-CM | POA: Diagnosis not present

## 2015-04-23 DIAGNOSIS — Z885 Allergy status to narcotic agent status: Secondary | ICD-10-CM | POA: Diagnosis not present

## 2015-04-23 DIAGNOSIS — N182 Chronic kidney disease, stage 2 (mild): Secondary | ICD-10-CM | POA: Diagnosis not present

## 2015-04-23 DIAGNOSIS — E872 Acidosis: Secondary | ICD-10-CM | POA: Diagnosis present

## 2015-04-23 DIAGNOSIS — M8580 Other specified disorders of bone density and structure, unspecified site: Secondary | ICD-10-CM | POA: Diagnosis not present

## 2015-04-23 DIAGNOSIS — Z510 Encounter for antineoplastic radiation therapy: Secondary | ICD-10-CM | POA: Diagnosis present

## 2015-04-23 DIAGNOSIS — S22089D Unspecified fracture of T11-T12 vertebra, subsequent encounter for fracture with routine healing: Secondary | ICD-10-CM | POA: Diagnosis not present

## 2015-04-23 DIAGNOSIS — I213 ST elevation (STEMI) myocardial infarction of unspecified site: Secondary | ICD-10-CM | POA: Diagnosis not present

## 2015-04-23 DIAGNOSIS — C099 Malignant neoplasm of tonsil, unspecified: Secondary | ICD-10-CM | POA: Diagnosis not present

## 2015-04-23 DIAGNOSIS — C9000 Multiple myeloma not having achieved remission: Secondary | ICD-10-CM | POA: Diagnosis not present

## 2015-04-23 DIAGNOSIS — M4854XD Collapsed vertebra, not elsewhere classified, thoracic region, subsequent encounter for fracture with routine healing: Secondary | ICD-10-CM | POA: Diagnosis not present

## 2015-04-23 DIAGNOSIS — M549 Dorsalgia, unspecified: Secondary | ICD-10-CM | POA: Diagnosis not present

## 2015-04-23 DIAGNOSIS — H532 Diplopia: Secondary | ICD-10-CM | POA: Diagnosis present

## 2015-04-23 DIAGNOSIS — R05 Cough: Secondary | ICD-10-CM | POA: Diagnosis present

## 2015-04-23 DIAGNOSIS — Z79891 Long term (current) use of opiate analgesic: Secondary | ICD-10-CM | POA: Diagnosis not present

## 2015-04-23 DIAGNOSIS — I2102 ST elevation (STEMI) myocardial infarction involving left anterior descending coronary artery: Secondary | ICD-10-CM | POA: Diagnosis not present

## 2015-04-23 DIAGNOSIS — E785 Hyperlipidemia, unspecified: Secondary | ICD-10-CM | POA: Diagnosis not present

## 2015-04-23 DIAGNOSIS — G893 Neoplasm related pain (acute) (chronic): Secondary | ICD-10-CM | POA: Diagnosis not present

## 2015-04-23 DIAGNOSIS — E871 Hypo-osmolality and hyponatremia: Secondary | ICD-10-CM | POA: Diagnosis present

## 2015-04-23 DIAGNOSIS — Z808 Family history of malignant neoplasm of other organs or systems: Secondary | ICD-10-CM | POA: Diagnosis not present

## 2015-04-23 DIAGNOSIS — Z8249 Family history of ischemic heart disease and other diseases of the circulatory system: Secondary | ICD-10-CM | POA: Diagnosis not present

## 2015-04-23 DIAGNOSIS — I255 Ischemic cardiomyopathy: Secondary | ICD-10-CM | POA: Diagnosis present

## 2015-04-23 DIAGNOSIS — I2510 Atherosclerotic heart disease of native coronary artery without angina pectoris: Secondary | ICD-10-CM | POA: Diagnosis not present

## 2015-04-23 DIAGNOSIS — I200 Unstable angina: Secondary | ICD-10-CM | POA: Diagnosis not present

## 2015-04-23 DIAGNOSIS — E876 Hypokalemia: Secondary | ICD-10-CM | POA: Diagnosis not present

## 2015-04-23 DIAGNOSIS — I129 Hypertensive chronic kidney disease with stage 1 through stage 4 chronic kidney disease, or unspecified chronic kidney disease: Secondary | ICD-10-CM | POA: Diagnosis present

## 2015-04-23 DIAGNOSIS — I429 Cardiomyopathy, unspecified: Secondary | ICD-10-CM | POA: Diagnosis not present

## 2015-04-23 DIAGNOSIS — K5900 Constipation, unspecified: Secondary | ICD-10-CM | POA: Diagnosis not present

## 2015-04-23 DIAGNOSIS — Z79899 Other long term (current) drug therapy: Secondary | ICD-10-CM | POA: Diagnosis not present

## 2015-04-23 DIAGNOSIS — Z85818 Personal history of malignant neoplasm of other sites of lip, oral cavity, and pharynx: Secondary | ICD-10-CM | POA: Diagnosis not present

## 2015-04-23 DIAGNOSIS — Z888 Allergy status to other drugs, medicaments and biological substances status: Secondary | ICD-10-CM | POA: Diagnosis not present

## 2015-04-23 DIAGNOSIS — I10 Essential (primary) hypertension: Secondary | ICD-10-CM | POA: Diagnosis not present

## 2015-04-23 DIAGNOSIS — Z880 Allergy status to penicillin: Secondary | ICD-10-CM | POA: Diagnosis not present

## 2015-04-23 DIAGNOSIS — Z886 Allergy status to analgesic agent status: Secondary | ICD-10-CM | POA: Diagnosis not present

## 2015-04-23 DIAGNOSIS — Z7952 Long term (current) use of systemic steroids: Secondary | ICD-10-CM | POA: Diagnosis not present

## 2015-04-23 DIAGNOSIS — K921 Melena: Secondary | ICD-10-CM | POA: Diagnosis present

## 2015-04-23 DIAGNOSIS — N179 Acute kidney failure, unspecified: Secondary | ICD-10-CM | POA: Diagnosis present

## 2015-04-23 LAB — BASIC METABOLIC PANEL
ANION GAP: 6 (ref 5–15)
ANION GAP: 5 (ref 5–15)
BUN: 11 mg/dL (ref 6–23)
BUN: 10 mg/dL (ref 6–23)
CALCIUM: 9.4 mg/dL (ref 8.4–10.5)
CHLORIDE: 106 mmol/L (ref 96–112)
CO2: 24 mmol/L (ref 19–32)
CO2: 23 mmol/L (ref 19–32)
CREATININE: 1.57 mg/dL — AB (ref 0.50–1.35)
Calcium: 9.4 mg/dL (ref 8.4–10.5)
Chloride: 104 mmol/L (ref 96–112)
Creatinine, Ser: 1.7 mg/dL — ABNORMAL HIGH (ref 0.50–1.35)
GFR calc Af Amer: 52 mL/min — ABNORMAL LOW (ref >90)
GFR, EST AFRICAN AMERICAN: 47 mL/min — AB (ref >90)
GFR, EST NON AFRICAN AMERICAN: 45 mL/min — AB (ref >90)
GFR, EST NON AFRICAN AMERICAN: 41 mL/min — AB (ref >90)
GLUCOSE: 111 mg/dL — AB (ref 70–99)
Glucose, Bld: 123 mg/dL — ABNORMAL HIGH (ref 70–99)
Potassium: 3.4 mmol/L — ABNORMAL LOW (ref 3.5–5.1)
Potassium: 3.1 mmol/L — ABNORMAL LOW (ref 3.5–5.1)
Sodium: 136 mmol/L (ref 135–145)
Sodium: 132 mmol/L — ABNORMAL LOW (ref 135–145)

## 2015-04-23 MED ORDER — OXYCODONE HCL 10 MG PO TABS: 10.0000 mg | ORAL_TABLET | Freq: Four times a day (QID) | ORAL | Status: DC | PRN

## 2015-04-23 MED ORDER — ACETAMINOPHEN 325 MG PO TABS: 650.0000 mg | ORAL_TABLET | Freq: Four times a day (QID) | ORAL | Status: DC | PRN

## 2015-04-23 MED ORDER — POTASSIUM CHLORIDE 20 MEQ/15ML (10%) PO SOLN: 40.0000 meq | Freq: Three times a day (TID) | ORAL | Status: DC

## 2015-04-23 MED ORDER — POLYETHYLENE GLYCOL 3350 17 G PO PACK: 17.0000 g | PACK | Freq: Two times a day (BID) | ORAL | Status: DC

## 2015-04-23 MED ORDER — CYCLOBENZAPRINE HCL 5 MG PO TABS: 5.0000 mg | ORAL_TABLET | Freq: Three times a day (TID) | ORAL | Status: DC

## 2015-04-23 MED ORDER — POTASSIUM CHLORIDE 20 MEQ/15ML (10%) PO SOLN
40.0000 meq | Freq: Three times a day (TID) | ORAL | Status: DC
  Administered 2015-04-23: 40 meq via ORAL
  Filled 2015-04-23 (×3): qty 30

## 2015-04-23 MED ORDER — OXYCODONE HCL ER 15 MG PO T12A: 15.0000 mg | EXTENDED_RELEASE_TABLET | Freq: Two times a day (BID) | ORAL | Status: DC

## 2015-04-23 NOTE — Evaluation (Signed)
Occupational Therapy Evaluation Patient Details Name: Eric Dudley MRN: [id number] DOB: 06-03-1951 Today's Date: 04/23/2015    History of Present Illness 64 yo male s/p T12 KP 04/19/15. Hx of multiple myeloma, tonsillar cancer, osteoporosis.    Clinical Impression   Pt admitted with the above diagnosis and has the deficits listed below. Pt would benefit from cont OT to increase independence with basic adls and adl transfers so he can d/c home safely with his wife. Feel pt would benefit from inpatient rehab before returning home to decrease the burden of care on pt's wife.     Follow Up Recommendations  CIR;Supervision/Assistance - 24 hour    Equipment Recommendations  Tub/shower seat;3 in 1 bedside comode    Recommendations for Other Services Rehab consult     Precautions / Restrictions Precautions Precautions: Fall;Back Restrictions Weight Bearing Restrictions: No      Mobility Bed Mobility Overal bed mobility: Needs Assistance Bed Mobility: Rolling;Sidelying to Sit;Sit to Supine Rolling: Min guard Sidelying to sit: Min assist   Sit to supine: Min assist;HOB elevated   General bed mobility comments: Pt requires frequent cues for back precautions.  Transfers Overall transfer level: Needs assistance Equipment used: Rolling walker (2 wheeled) Transfers: Sit to/from Stand Sit to Stand: Min assist         General transfer comment: Pt prefers to push off walker and not reach back for chair when sitting.  Reviewed proper hand placment and back precautions at length.    Balance Overall balance assessment: Needs assistance Sitting-balance support: Bilateral upper extremity supported;Feet supported Sitting balance-Leahy Scale: Fair Sitting balance - Comments: relies  on UE's  while sitting, unable to freely sit  withou UE support.   Standing balance support: Bilateral upper extremity supported;During functional activity Standing balance-Leahy Scale: Poor Standing  balance comment: Pt must have outside assist to stand.  Depends heavily on the walker.                            ADL Overall ADL's : Needs assistance/impaired Eating/Feeding: Set up;Sitting   Grooming: Set up;Sitting Grooming Details (indicate cue type and reason): attempted in standing but could not tolerate standing long enough to groom at sink.  Pt feels he needs his hands on walker at all times.  Pt not comfortable putting hands on sink to groom and had to encourage him not to bed over and put elbows on the sink due to back kyphoplasty. Upper Body Bathing: Set up;Sitting   Lower Body Bathing: Moderate assistance;Sit to/from stand Lower Body Bathing Details (indicate cue type and reason): pt will need AE to reach lower legs due to pain and pt unable to stand and let go of walker long enough to bathe backside and private areas. Upper Body Dressing : Set up;Sitting   Lower Body Dressing: Maximal assistance;Sit to/from stand;Cueing for back precautions Lower Body Dressing Details (indicate cue type and reason): Pt unable to stand and manage clothes b/c pt unable to let go of walker.  Pt cannot bend legs up to get started with LE dressing. Pt may need AE if pain continues. Toilet Transfer: Minimal assistance;Ambulation;BSC;Comfort height toilet;Cueing for safety Toilet Transfer Details (indicate cue type and reason): cues for hand placement.  Pt does not like to reach back for surface he is going to.  Discussed with him the need to do this b/c it is safer and easier on his back. Toileting- Clothing Manipulation and Hygiene: Moderate assistance;Sit to/from  stand Toileting - Clothing Manipulation Details (indicate cue type and reason): Pt holds to walker while second person assists with pulling pants up.   Tub/Shower Transfer Details (indicate cue type and reason): Discussed how to get into walk in shower with walker but pt in too much pain to attempt it. Functional mobility during  ADLs: Minimal assistance;Rolling walker General ADL Comments: pt with great amount of difficulty with LE adls due to pain and back precautions. Pt will need to be introduced to AE.  Pt remains in great pain and has difficulty following back precautions.     Vision Vision Assessment?: No apparent visual deficits   Perception Perception Perception Tested?: No   Praxis Praxis Praxis tested?: Not tested    Pertinent Vitals/Pain Pain Assessment: 0-10 Pain Score: 10-Worst pain ever Pain Location: back with transition movements Pain Descriptors / Indicators: Aching;Shooting;Sharp Pain Intervention(s): Limited activity within patient's tolerance;Monitored during session;Premedicated before session;Repositioned     Hand Dominance Right   Extremity/Trunk Assessment Upper Extremity Assessment Upper Extremity Assessment: Overall WFL for tasks assessed   Lower Extremity Assessment Lower Extremity Assessment: Defer to PT evaluation   Cervical / Trunk Assessment Cervical / Trunk Assessment: Normal   Communication Communication Communication: No difficulties   Cognition Arousal/Alertness: Awake/alert Behavior During Therapy: Anxious;WFL for tasks assessed/performed Overall Cognitive Status: Within Functional Limits for tasks assessed                     General Comments       Exercises       Shoulder Instructions      Home Living Family/patient expects to be discharged to:: Private residence Living Arrangements: Spouse/significant other Available Help at Discharge: Available 24 hours/day;Family Type of Home: House Home Access: Stairs to enter [employer] of Steps: 3 Entrance Stairs-Rails: Right Home Layout: One level     Bathroom Shower/Tub: Walk-in shower;Door   [employer] Toilet: Standard     Home Equipment: None          Prior Functioning/Environment Level of Independence: Independent        Comments: pt with back pain for last 3 months  but has remained independent until this admission.    OT Diagnosis: Acute pain;Generalized weakness   OT Problem List: Decreased strength;Decreased activity tolerance;Impaired balance (sitting and/or standing);Decreased knowledge of use of DME or AE;Decreased knowledge of precautions;Pain   OT Treatment/Interventions: Self-care/ADL training;DME and/or AE instruction;Therapeutic activities    OT Goals(Current goals can be found in the care plan section) Acute Rehab OT Goals Patient Stated Goal: less pain. regain independence with mobility OT Goal Formulation: With patient Time For Goal Achievement: 05/07/15 Potential to Achieve Goals: Good ADL Goals Pt Will Perform Grooming: with supervision;standing Pt Will Perform Lower Body Bathing: with supervision;with adaptive equipment;sit to/from stand Pt Will Perform Lower Body Dressing: with supervision;with adaptive equipment;sit to/from stand Pt Will Perform Tub/Shower Transfer: Shower transfer;with min assist;ambulating;shower seat;rolling walker Additional ADL Goal #1: pt will transfer to 3:1 over commode and complete all toileting with S. Additional ADL Goal #2: Pt will state 3 back precautions and relate them to adls with min cues.  OT Frequency: Min 2X/week   Barriers to D/C: Decreased caregiver support  only wife able to assist       Co-evaluation              End of Session Equipment Utilized During Treatment: Rolling walker Nurse Communication: Mobility status  Activity Tolerance: Patient limited by pain Patient left: in bed;with call  bell/phone within reach;with bed alarm set;Other (comment) (pt unable to tolerate sitting in chair.)   Time: 9201-0071 OT Time Calculation (min): 39 min Charges:  OT General Charges $OT Visit: 1 Procedure OT Evaluation $Initial OT Evaluation Tier I: 1 Procedure OT Treatments $Self Care/Home Management : 8-22 mins G-Codes:    Glenford Peers 05/09/15, 10:25 AM  [phone]

## 2015-04-23 NOTE — Progress Notes (Signed)
Report called to [facility] to [facility] that will receive patient. Patient stable from AM assessment and will be transported via [facility] to facility.

## 2015-04-23 NOTE — Discharge Summary (Signed)
Physician Discharge Summary  Eric Dudley WUJ:[id number] DOB: 1951/08/28 DOA: 04/16/2015  PCP: [name], MD  Primary oncologist: Dr. Truitt Merle.  Admit date: 04/16/2015 Discharge date: 04/23/2015  Time spent: Greater than 30 minutes  Recommendations for Outpatient Follow-up:  1. M.D. at SNF: Please follow BMP every 3 days to address hypokalemia and renal insufficiency. Next draw 04/24/15. 2. Dr. Truitt Merle, Medical Oncology on 04/26/15 at the cancer center for his next cycle of chemotherapy. 3. Dr. Eppie Gibson, Radiation Oncology at the cancer center - to evaluate for palliative RT. SNF to call for appointment. 4. Recommend outpatient nephrology consultation.  Discharge Diagnoses:  Principal Problem:   Vertebral compression fracture: T12 pathological fracture Active Problems:   CKD (chronic kidney disease), stage II   Multiple myeloma   HTN (hypertension)   Hyperlipidemia   Tonsillar cancer   Acute back pain   T12 compression fracture   Thoracic back pain   AKI (acute kidney injury)   Discharge Condition: Improved & Stable  Diet recommendation: Heart healthy diet.  Filed Weights   04/16/15 0557  Weight: 91.627 kg (202 lb)    History of present illness:  64 year old male patient with newly diagnosed multiple myeloma, recently started chemotherapy last week, presented with acute onset of back pain. Workup reveals T12 pathological fracture. Neurosurgery does not recommend surgical intervention but recommend IR consultated for vertebroplasty on 4.22.2016. IR & Oncology consulted. S/P T12 kyphoplasty in IR on 4/22.  Hospital Course:   Acute back pain due to vertebral T12 pathologic fracture: - MRI as below, discussed with neurosurgery recommended no further surgical intervention. - Discussed with radiation oncologist, will receive palliative radiation treatment in 2 weeks. - Has been started on long-acting oxycontin - patient will be evaluated by Dr. Burr Medico on 4/29 to  decide if this can be tapered/stopped/continued. - Status post kyphoplasty by IR on 4/22 - Pain has significantly improved post procedure. - PT recommended CIR consult and patient was felt to be appropriate but no beds available at this time and hence patient has accepted discharge to SNF for rehabilitation.  Acute on Chronic kidney stage II: - Mild increase in Cr- 2.02 on 4/22, most likely multifactorial due to ARB and Possible Vanc-received peri-KP. - Hold ARB and vancomycin. - Creatinine at baseline 1.6 - Recommend outpatient nephrology consultation to evaluate for chronic kidney disease and recurrent severe hypokalemia which may be related to tubular dysfunction and potassium wasting.  Multiple myeloma: - He was started on Revlimid home dose on 04/18/2015 - S/P Vecade & Dexa 40 mg on 04/19/2015. - He has vertebral body lesions in L3 T9 seen on MRI. - Discussed with the oncologist and okay to discharge to SNF and keep a follow-up on 4/29 for next cycle of chemotherapy  Essential hypertension: - ARB held secondary to acute on chronic kidney disease - Controlled - Blood pressures are controlled off medications. Will DC without ARB or antihypertensives at this time. If his blood pressures start to rise, consider non-ACEI/ARB medications.  Hyperlipidemia: - Vytorin.  Hypo-kalemia: - Patient seems to have chronic hypokalemia dating back to 03/15/15. - Despite replacement, his potassium dropped to as low as 2.6 on 4/24. 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 They recommended aggressive replacement using potassium chloride liquid which was done overnight and his potassium has improved to 3.1. As per their recommendation, patient will be discharged on 40 mEq 3 times a day with BMP follow-up every third day  to monitor potassium levels and his supplements can be adjusted appropriately. This will also help monitoring his  creatinine. - Recommend outpatient nephrology consultation. - Mild hyponatremia of unclear etiology/significance. Follow BMP.  Constipation - bowel regimen - multifactorial - Patient had BM last night and this morning.  Consultants:  Medical Oncology  Radiation Oncology  IR  CIR  Procedures:  Foley catheter-DC'd 4/20  Status post T12 kyphoplasty 4/22  Discharge Exam:  Complaints: Patient had BM last night and this morning. Mild back soreness but significantly improved compared to admission.  Filed Vitals:   04/22/15 1334 04/22/15 1935 04/22/15 2128 04/23/15 0508  BP: 135/75  125/53 107/61  Pulse: 89 68 70 77  Temp: 97.9 F (36.6 C)  97.5 F (36.4 C) 97.6 F (36.4 C)  TempSrc: Oral  Oral Oral  Resp: '16  16 18  ' Height:      Weight:      SpO2: 94%  97% 97%    General exam: Pleasant middle-aged male lying comfortably in bed . Respiratory system: Clear. No increased work of breathing. Cardiovascular system: S1 & S2 heard, RRR. No JVD, murmurs, gallops, clicks or pedal edema. Gastrointestinal system: Abdomen is nondistended, soft and nontender. Normal bowel sounds heard. Central nervous system: Alert and oriented. No focal neurological deficits. Extremities: Symmetric 5 x 5 power. MSS: no acute findings at [place] site.  Discharge Instructions  Discharge Instructions    Call MD for:  redness, tenderness, or signs of infection (pain, swelling, redness, odor or green/yellow discharge around incision site)    Complete by:  As directed      Call MD for:  severe uncontrolled pain    Complete by:  As directed      Diet - low sodium heart healthy    Complete by:  As directed      Increase activity slowly    Complete by:  As directed      TREATMENT CONDITIONS    Complete by:  As directed   Patient should have CBC & CMP within 7 days prior to chemotherapy administration. NOTIFY MD IF: ANC < 1500, Hemoglobin < 8, PLT < 100,000,  Total Bili > 1.5, Creatinine > 1.5, ALT &  AST > 80 or if patient has unstable vital signs: Temperature > 38.5, SBP > 180 or < 90, RR > 30 or HR > 100.            Medication List    STOP taking these medications        DIOVAN 80 MG tablet  Generic drug:  valsartan     HYDROcodone-acetaminophen 5-325 MG per tablet  Commonly known as:  NORCO/VICODIN     potassium chloride SA 20 MEQ tablet  Commonly known as:  K-DUR,KLOR-CON     psyllium 58.6 % powder  Commonly known as:  METAMUCIL      TAKE these medications        acetaminophen 325 MG tablet  Commonly known as:  TYLENOL  Take 2 tablets (650 mg total) by mouth every 6 (six) hours as needed for mild pain, fever or headache.     acyclovir 400 MG tablet  Commonly known as:  ZOVIRAX  Take 1 tablet (400 mg total) by mouth 2 (two) times daily.     CENTRUM SILVER ULTRA MENS PO  Take 1 tablet by mouth daily.     cyclobenzaprine 5 MG tablet  Commonly known as:  FLEXERIL  Take 1 tablet (5 mg total) by mouth 3 (  three) times daily.     dexamethasone 4 MG tablet  Commonly known as:  DECADRON  Take 10 tablets (40 mg total) by mouth once a week. Take 40 mg ( 10 tablets )  Once  A  Week  On  Day  Of  Velcade.     DULCOLAX PO  Take 3 capsules by mouth daily as needed (for constipation).     ezetimibe-simvastatin 10-40 MG per tablet  Commonly known as:  VYTORIN  Take 1 tablet by mouth at bedtime.     lenalidomide 10 MG capsule  Commonly known as:  REVLIMID  Take 1 capsule (10 mg total) by mouth daily. Take for 21 days and rest 7 days. ICD-10 is C90.00.     ondansetron 8 MG tablet  Commonly known as:  ZOFRAN  Take 1 tablet (8 mg total) by mouth 2 (two) times daily as needed for nausea or vomiting.     Oxycodone HCl 10 MG Tabs  Take 1 tablet (10 mg total) by mouth every 6 (six) hours as needed for moderate pain or severe pain.     OxyCODONE 15 mg T12a 12 hr tablet  Commonly known as:  OXYCONTIN  Take 1 tablet (15 mg total) by mouth every 12 (twelve) hours.      polyethylene glycol packet  Commonly known as:  MIRALAX / GLYCOLAX  Take 17 g by mouth 2 (two) times daily.     potassium chloride 20 MEQ/15ML (10%) Soln  Take 30 mLs (40 mEq total) by mouth 3 (three) times daily.     PRESCRIPTION MEDICATION  Chemo - [facility]     ranitidine 150 MG tablet  Commonly known as:  ZANTAC  Take 300 mg by mouth at bedtime.     sennosides-docusate sodium 8.6-50 MG tablet  Commonly known as:  SENOKOT-S  Take 2 tablets by mouth 2 (two) times daily.          The results of significant diagnostics from this hospitalization (including imaging, microbiology, ancillary and laboratory) are listed below for reference.    Significant Diagnostic Studies: Ct Abdomen Pelvis Wo Contrast  04/16/2015   CLINICAL DATA:  Bilateral back pain starting Friday, history of tonsillar cancer 10 years ago  EXAM: CT ABDOMEN AND PELVIS WITHOUT CONTRAST  TECHNIQUE: Multidetector CT imaging of the abdomen and pelvis was performed following the standard protocol without IV contrast.  COMPARISON:  03/19/2015 bone survey  FINDINGS: Lung bases shows bilateral basilar posterior atelectasis. There is significant osteoporosis thoracolumbar spine.  There is interval moderate compression deformity of T12 vertebral body. Subacute compression fracture is suspected. Further correlation with MRI is recommended as clinically warranted.  Axial image 45 there is a lytic lesion L3 vertebral body measures about 1.4 cm. This is best seen on sagittal image 61. Metastatic disease cannot be excluded. Further correlation with MRI is recommended.  Unenhanced liver shows no biliary ductal dilatation. Small hiatal hernia.  Tiny calcified gallstones are noted within gallbladder the largest measures 3.8 mm. Unenhanced pancreas, spleen and adrenal glands are unremarkable. Unenhanced kidneys are symmetrical in size. No hydronephrosis or hydroureter. Nonobstructive calculus in lower pole of the right kidney measures 4 mm. There  is a cortical calcification in lower pole of the left kidney measures 3 mm.  No calcified ureteral calculi are noted bilaterally. Atherosclerotic calcifications of abdominal aorta and iliac arteries.  No small bowel obstruction. Moderate stool noted in right colon and cecum. No pericecal inflammation. The terminal ileum is unremarkable. Moderate distended urinary  bladder. Prostate gland and seminal vesicles are unremarkable. Diffuse osteoporosis noted within pelvis and bilateral hips.  IMPRESSION: 1. Diffuse osteoporosis thoracolumbar spine. There is interval moderate compression deformity of T12 vertebral body. Subacute pathologic compression fracture cannot be excluded. There is a lytic lesion L3 vertebral body measures about 1.4 cm. Metastatic disease cannot be excluded. Further correlation with MRI is recommended. 2. Bilateral nonobstructive nephrolithiasis. No hydronephrosis or hydroureter. 3. No calcified ureteral calculi. 4. No pericecal inflammation.  Unremarkable terminal ileum. 5. Small layering gallstones are noted within gallbladder the largest measures 3.8 mm. 6. Moderate distended urinary bladder. No stones are noted within urinary bladder   Electronically Signed   By: Lahoma Crocker M.D.   On: 04/16/2015 10:39   Mr Thoracic Spine Wo Contrast  04/17/2015   ADDENDUM REPORT: 04/17/2015 09:01  ADDENDUM: The MRI thoracic spine Impression should read: Pathologic compression fracture at T12 in this patient with widespread multiple myeloma, not T8. Findings discussed with Dr. Isidore Moos.   Electronically Signed   By: Rolla Flatten M.D.   On: 04/17/2015 09:01   04/17/2015   CLINICAL DATA:  Patient with multiple myeloma and acute back pain. Symptoms for several weeks, much worse in the past 4 days. Initial encounter.  EXAM: MRI THORACIC AND LUMBAR SPINE WITHOUT CONTRAST  TECHNIQUE: Multiplanar and multiecho pulse sequences of the thoracic and lumbar spine were obtained without intravenous contrast.  COMPARISON:  CT  urogram earlier today.  FINDINGS: MR THORACIC SPINE FINDINGS  Numbering of the thoracic spine was accomplished by counting down from the odontoid. New  Diffuse marrow signal abnormality consistent with myeloma. Minor endplate softening superiorly and inferiorly at T9. There is an acute to subacute compression fracture of T12 vertebral body with moderate BILATERAL endplate can cavity and loss of height estimated 30-50%. Retropulsion of abnormal bone at T12 without conus compression. No other pathologic compression deformities. Focal myeloma deposit midline T9 posteriorly measuring 10 x 10 x 20 mm. No significant mass effect on the spinal cord.  No significant thoracic disc protrusion or spinal stenosis. Mild annular bulging T7-T8. No cord compression at any level. Small layering BILATERAL effusions. Bibasilar atelectasis.  MR LUMBAR SPINE FINDINGS  Diffusely abnormal marrow consistent with the history of myeloma. Focal deposit posterior aspect of L3 on the RIGHT measures 19 x 13 x 15 mm. No pathologic compression deformity. Mild stenosis at L4-5 relates to ordinary spondylosis with annular bulging and facet arthropathy. Focal rightward protrusion at this level narrows the foramen, potentially affecting the L4 nerve root. No obstructive uropathy.  IMPRESSION: MR THORACIC SPINE IMPRESSION  Pathologic compression fracture at T8 in this patient with widespread multiple myeloma. Slight retropulsion of abnormal bone without conus compression.  MR LUMBAR SPINE IMPRESSION  Focal myeloma deposits at L3 and T9 without significant compression deformity.  Mild stenosis at L4-5 related to ordinary spondylosis.  Electronically Signed: By: Rolla Flatten M.D. On: 04/16/2015 20:14   Mr Lumbar Spine Wo Contrast  04/17/2015   ADDENDUM REPORT: 04/17/2015 09:01  ADDENDUM: The MRI thoracic spine Impression should read: Pathologic compression fracture at T12 in this patient with widespread multiple myeloma, not T8. Findings discussed  with Dr. Isidore Moos.   Electronically Signed   By: Rolla Flatten M.D.   On: 04/17/2015 09:01   04/17/2015   CLINICAL DATA:  Patient with multiple myeloma and acute back pain. Symptoms for several weeks, much worse in the past 4 days. Initial encounter.  EXAM: MRI THORACIC AND LUMBAR SPINE WITHOUT CONTRAST  TECHNIQUE:  Multiplanar and multiecho pulse sequences of the thoracic and lumbar spine were obtained without intravenous contrast.  COMPARISON:  CT urogram earlier today.  FINDINGS: MR THORACIC SPINE FINDINGS  Numbering of the thoracic spine was accomplished by counting down from the odontoid. New  Diffuse marrow signal abnormality consistent with myeloma. Minor endplate softening superiorly and inferiorly at T9. There is an acute to subacute compression fracture of T12 vertebral body with moderate BILATERAL endplate can cavity and loss of height estimated 30-50%. Retropulsion of abnormal bone at T12 without conus compression. No other pathologic compression deformities. Focal myeloma deposit midline T9 posteriorly measuring 10 x 10 x 20 mm. No significant mass effect on the spinal cord.  No significant thoracic disc protrusion or spinal stenosis. Mild annular bulging T7-T8. No cord compression at any level. Small layering BILATERAL effusions. Bibasilar atelectasis.  MR LUMBAR SPINE FINDINGS  Diffusely abnormal marrow consistent with the history of myeloma. Focal deposit posterior aspect of L3 on the RIGHT measures 19 x 13 x 15 mm. No pathologic compression deformity. Mild stenosis at L4-5 relates to ordinary spondylosis with annular bulging and facet arthropathy. Focal rightward protrusion at this level narrows the foramen, potentially affecting the L4 nerve root. No obstructive uropathy.  IMPRESSION: MR THORACIC SPINE IMPRESSION  Pathologic compression fracture at T8 in this patient with widespread multiple myeloma. Slight retropulsion of abnormal bone without conus compression.  MR LUMBAR SPINE IMPRESSION  Focal  myeloma deposits at L3 and T9 without significant compression deformity.  Mild stenosis at L4-5 related to ordinary spondylosis.  Electronically Signed: By: Rolla Flatten M.D. On: 04/16/2015 20:14   Ct Biopsy  03/27/2015   CLINICAL DATA:  MONOCLONAL GAMMOPATHY OF UNKNOWN SIGNIFICANCE  EXAM: CT GUIDED RIGHT ILIAC BONE MARROW ASPIRATION AND CORE BIOPSY  Date:  3/30/20163/30/2016 9:38 am  Radiologist:  M. Daryll Brod, MD  Guidance:  CT  FLUOROSCOPY TIME:  None.  MEDICATIONS AND MEDICAL HISTORY: 2 mg Versed, 100 mcg fentanyl  ANESTHESIA/SEDATION: 15 minutes  CONTRAST:  None.  COMPLICATIONS: None  PROCEDURE: Informed consent was obtained from the patient following explanation of the procedure, risks, benefits and alternatives. The patient understands, agrees and consents for the procedure. All questions were addressed. A time out was performed.  The patient was positioned prone and noncontrast localization CT was performed of the pelvis to demonstrate the iliac marrow spaces.  Maximal barrier sterile technique utilized including caps, mask, sterile gowns, sterile gloves, large sterile drape, hand hygiene, and betadine prep.  Under sterile conditions and local anesthesia, an 11 gauge coaxial bone biopsy needle was advanced into the right iliac marrow space. Needle position was confirmed with CT imaging. Initially, bone marrow aspiration was performed. Next, the 11 gauge outer cannula was utilized to obtain a right iliac bone marrow core biopsy. Needle was removed. Hemostasis was obtained with compression. The patient tolerated the procedure well. Samples were prepared with the cytotechnologist. No immediate complications.  IMPRESSION: CT guided right iliac bone marrow aspiration and core biopsy.   Electronically Signed   By: Jerilynn Mages.  Shick M.D.   On: 03/27/2015 12:27   Ir Kypho Vertebral Thoracic Augmentation  04/19/2015   CLINICAL DATA:  T12 pathologic compression fracture  EXAM: T12 KYPHOPLASTY  FLUOROSCOPY TIME:  5  minutes and 5 seconds  MEDICATIONS AND MEDICAL HISTORY: General endotracheal anesthesia was provided  ANESTHESIA/SEDATION: Not applicable  CONTRAST:  None  PROCEDURE: The procedure, risks, benefits, and alternatives were explained to the patient. Questions regarding the procedure were encouraged and answered.  The patient understands and consents to the procedure.  The back was prepped with Betadine in a sterile fashion, and a sterile drape was applied covering the operative field. A sterile gown and sterile gloves were used for the procedure.  Under fluoroscopic guidance, a 8 gauge needle was inserted into the T12 vertebral body via a left transpedicular approach. A drill was advanced to the anterior third of the vertebral body. A 20 mm balloon was inserted and insufflated. The balloon is positioned in the anterior third and at the midline. The cement trocar was inserted and cement was injected. No extra osseous cement was noted. After cement hardening, the patient was transferred to her bed without complication.  FINDINGS: Images demonstrate needle placement in the T12 vertebral body via a left transpedicular approach. T12 balloon kyphoplasty is documented. The final image demonstrates cement filling the vertebral body, crossing the midline, and extending from the inferior to superior end plate. No extra osseous cement.  IMPRESSION: Successful T12 kyphoplasty.   Electronically Signed   By: [name] M.D.   On: 04/19/2015 11:37    Microbiology: Recent Results (from the past 240 hour(s))  Surgical pcr screen     Status: None   Collection Time: 04/19/15 12:02 PM  Result Value Ref Range Status   MRSA, PCR NEGATIVE NEGATIVE Final   Staphylococcus aureus NEGATIVE NEGATIVE Final    Comment:        The Xpert SA Assay (FDA approved for NASAL specimens in patients over 55 years of age), is one component of a comprehensive surveillance program.  Test performance has been validated by [facility] for  patients greater than or equal to 18 year old. It is not intended to diagnose infection nor to guide or monitor treatment.      Labs: Basic Metabolic Panel:  Recent Labs Lab 04/21/15 0452 04/22/15 0408 04/22/15 1630 04/23/15 0418 04/23/15 0841  NA 137 140 138 136 132*  K 2.6* 2.8* 2.6* 3.4* 3.1*  CL 106 106 105 106 104  CO2 '25 26 27 24 23  ' GLUCOSE 109* 111* 103* 111* 123*  BUN '20 15 11 11 10  ' CREATININE 1.63* 1.65* 1.52* 1.57* 1.70*  CALCIUM 9.3 9.4 9.3 9.4 9.4  MG 2.0  --   --   --   --    Liver Function Tests:  Recent Labs Lab 04/17/15 0509  AST 36  ALT 48  ALKPHOS 147*  BILITOT 0.7  PROT 8.8*  ALBUMIN 4.2   No results for input(s): LIPASE, AMYLASE in the last 168 hours. No results for input(s): AMMONIA in the last 168 hours. CBC:  Recent Labs Lab 04/17/15 0509 04/22/15 0408  WBC 8.0 9.2  HGB 14.6 14.1  HCT 44.0 42.0  MCV 100.0 98.6  PLT 263 219   Cardiac Enzymes: No results for input(s): CKTOTAL, CKMB, CKMBINDEX, TROPONINI in the last 168 hours. BNP: BNP (last 3 results) No results for input(s): BNP in the last 8760 hours.  ProBNP (last 3 results) No results for input(s): PROBNP in the last 8760 hours.  CBG: No results for input(s): GLUCAP in the last 168 hours.    Signed:  Vernell Leep, MD, FACP, FHM. [facility] Pager [phone]  If 7PM-7AM, please contact night-coverage [email or web address] Password [facility] 04/23/2015, 10:56 AM

## 2015-04-23 NOTE — Progress Notes (Signed)
Inpatient Rehabilitation  In follow up to Dr. Naaman Plummer' PMR consult earlier today, I have attempted to visit pt. to confirm his understanding of the recommendations and plan.  Pt. Has already been Dc'd to a SNF.  I will sign off.  Please call if questions.  [place] Admissions Coordinator Cell [phone] Office [phone]

## 2015-04-23 NOTE — Progress Notes (Signed)
Physical Therapy Treatment Patient Details Name: Eric Dudley MRN: [id number] DOB: 05-01-1951 Today's Date: 04/23/2015    History of Present Illness 64 yo male s/p T12 KP 04/19/15. Hx of multiple myeloma, tonsillar cancer, osteoporosis.     PT Comments    Pt progressing, wanting to avoid taking pain meds, motivated for rehab setting  Follow Up Recommendations  SNF     Equipment Recommendations  Rolling walker with 5" wheels    Recommendations for Other Services       Precautions / Restrictions Precautions Precautions: Fall;Back    Mobility  Bed Mobility Overal bed mobility: Needs Assistance Bed Mobility: Rolling;Sidelying to Sit;Sit to Sidelying Rolling: Supervision Sidelying to sit: Min guard     Sit to sidelying: Min assist General bed mobility comments: Pt requires frequent cues for back precautions,  assist with LEs onto bed but able to do more this session  Transfers Overall transfer level: Needs assistance Equipment used: Rolling walker (2 wheeled) Transfers: Sit to/from Stand Sit to Stand: Min assist         General transfer comment: 2 attempts to stand d/t pain; cues for back precautions and  hand placement  Ambulation/Gait Ambulation/Gait assistance: Min guard Ambulation Distance (Feet): 90 Feet Assistive device: Rolling walker (2 wheeled);4-wheeled walker Gait Pattern/deviations: Step-through pattern;Decreased stride length;Narrow base of support     General Gait Details: cues for posture and to avoid twisting with turns   Financial trader [place] (Stroke Patients Only)       Balance           Standing balance support: Bilateral upper extremity supported Standing balance-Leahy Scale: Poor Standing balance comment: heavy reliance on RW to stand                    Cognition Arousal/Alertness: Awake/alert Behavior During Therapy: Anxious;WFL for tasks assessed/performed Overall  Cognitive Status: Within Functional Limits for tasks assessed                      Exercises      General Comments General comments (skin integrity, edema, etc.): pt motivated but is a fall risk due to pain.      Pertinent Vitals/Pain Pain Assessment: 0-10 Pain Score: 7  Pain Location: back with transition movements Pain Descriptors / Indicators: Shooting;Sharp Pain Intervention(s): Limited activity within patient's tolerance;Monitored during session;Other (comment) (pt did not want meds)    Home Living                      Prior Function            PT Goals (current goals can now be found in the care plan section) Acute Rehab PT Goals Patient Stated Goal: less pain. regain independence with mobility PT Goal Formulation: With patient Time For Goal Achievement: 05/04/15 Potential to Achieve Goals: Good Progress towards PT goals: Progressing toward goals    Frequency  Min 3X/week    PT Plan Discharge plan needs to be updated    Co-evaluation             End of Session   Activity Tolerance: Patient tolerated treatment well;No increased pain Patient left: in bed;with call bell/phone within reach;with family/visitor present     Time: 1345-1404 PT Time Calculation (min) (ACUTE ONLY): 19 min  Charges:  $Gait Training: 8-22 mins  G CodesKenyon Ana 04/23/2015, 2:09 PM

## 2015-04-23 NOTE — Plan of Care (Signed)
Problem: Phase I Progression Outcomes Goal: OOB as tolerated unless otherwise ordered Outcome: Progressing Up with supervison using walker this am to bathroom and back to bed.

## 2015-04-23 NOTE — Progress Notes (Signed)
CSW continuing to follow.   CSW received notification from [facility] that [facility] does not have any beds available.  CSW met with pt and pt wife at bedside to notify of no bed availability at [facility] and provide SNF bed offers.   Pt accepts bed offer at [facility].   CSW contacted facility and confirmed bed availability for today.  CSW facilitated pt discharge needs including contacting facility, faxing pt discharge information via [facility], discussing with pt and pt wife at bedside, providing RN phone number to call report, and arranging ambulance transport via [place].   No further social work needs identified at this time.   CSW signing off.   Alison Murray, MSW, [place] [facility] [phone]

## 2015-04-23 NOTE — Clinical Social Work Placement (Signed)
   CLINICAL SOCIAL WORK PLACEMENT  NOTE  Date:  04/23/2015  Patient Details  Name: Eric Dudley MRN: [id number] Date of Birth: 03-Feb-1951  Clinical Social Work is seeking post-discharge placement for this patient at the [place] level of care (*CSW will initial, date and re-position this form in  chart as items are completed):  Yes   Patient/family provided with [place] Work Department's list of facilities offering this level of care within the geographic area requested by the patient (or if unable, by the patient's family).  Yes   Patient/family informed of their freedom to choose among providers that offer the needed level of care, that participate in Medicare, Medicaid or managed care program needed by the patient, have an available bed and are willing to accept the patient.  Yes   Patient/family informed of 's ownership interest in [facility] and [facility], as well as of the fact that they are under no obligation to receive care at these facilities.  PASRR submitted to EDS on 04/22/15     PASRR number received on 04/22/15     Existing PASRR number confirmed on       FL2 transmitted to all facilities in geographic area requested by pt/family on 04/22/15     FL2 transmitted to all facilities within larger geographic area on       Patient informed that his/her managed care company has contracts with or will negotiate with certain facilities, including the following:        Yes   Patient/family informed of bed offers received.  Patient chooses bed at [facility] [place]     Physician recommends and patient chooses bed at      Patient to be transferred to [facility] on 04/23/15.  Patient to be transferred to facility by ambulance/PTAR     Patient family notified on 04/23/15 of transfer.  Name of family member notified:  pt and pt wife notified at bedside     PHYSICIAN Please sign FL2      Additional Comment:    _______________________________________________ Ladell Pier, LCSW 04/23/2015, 12:51 PM

## 2015-04-23 NOTE — Plan of Care (Signed)
Problem: Phase I Progression Outcomes Goal: Pain controlled with appropriate interventions Outcome: Progressing Did no require any prn pain medications during the night. Pain between 2-5/10 in back. On scheduled oxycontin.

## 2015-04-26 ENCOUNTER — Other Ambulatory Visit (HOSPITAL_COMMUNITY)
Admission: AD | Admit: 2015-04-26 | Discharge: 2015-04-26 | Disposition: A | Discharge status: Home / Self Care | Payer: Medicare Other | Source: Ambulatory Visit | Attending: Hematology | Admitting: Hematology

## 2015-04-26 ENCOUNTER — Other Ambulatory Visit: Payer: Medicare Other

## 2015-04-26 ENCOUNTER — Ambulatory Visit (HOSPITAL_BASED_OUTPATIENT_CLINIC_OR_DEPARTMENT_OTHER): Payer: Medicare Other

## 2015-04-26 ENCOUNTER — Ambulatory Visit
Admit: 2015-04-26 | Discharge: 2015-04-26 | Disposition: A | Discharge status: Home / Self Care | Payer: Medicare Other | Attending: Radiation Oncology | Admitting: Radiation Oncology

## 2015-04-26 ENCOUNTER — Encounter (HOSPITAL_COMMUNITY): Payer: Self-pay | Admitting: *Deleted

## 2015-04-26 ENCOUNTER — Other Ambulatory Visit: Payer: Self-pay

## 2015-04-26 ENCOUNTER — Ambulatory Visit (HOSPITAL_BASED_OUTPATIENT_CLINIC_OR_DEPARTMENT_OTHER): Payer: Medicare Other | Admitting: Hematology

## 2015-04-26 ENCOUNTER — Observation Stay (HOSPITAL_COMMUNITY): Payer: Medicare Other

## 2015-04-26 ENCOUNTER — Inpatient Hospital Stay (HOSPITAL_COMMUNITY)
Admission: AD | Admit: 2015-04-26 | Discharge: 2015-05-01 | DRG: 280 | Disposition: A | Discharge status: Skilled Nursing Facility | Payer: Medicare Other | Source: Ambulatory Visit | Attending: Internal Medicine | Admitting: Internal Medicine

## 2015-04-26 VITALS — BP 123/70 | HR 81 | Temp 97.8°F

## 2015-04-26 DIAGNOSIS — Z85818 Personal history of malignant neoplasm of other sites of lip, oral cavity, and pharynx: Secondary | ICD-10-CM

## 2015-04-26 DIAGNOSIS — M8580 Other specified disorders of bone density and structure, unspecified site: Secondary | ICD-10-CM

## 2015-04-26 DIAGNOSIS — C9000 Multiple myeloma not having achieved remission: Secondary | ICD-10-CM

## 2015-04-26 DIAGNOSIS — G893 Neoplasm related pain (acute) (chronic): Secondary | ICD-10-CM

## 2015-04-26 DIAGNOSIS — Z510 Encounter for antineoplastic radiation therapy: Secondary | ICD-10-CM | POA: Diagnosis not present

## 2015-04-26 DIAGNOSIS — R05 Cough: Secondary | ICD-10-CM | POA: Diagnosis present

## 2015-04-26 DIAGNOSIS — E785 Hyperlipidemia, unspecified: Secondary | ICD-10-CM | POA: Diagnosis present

## 2015-04-26 DIAGNOSIS — I129 Hypertensive chronic kidney disease with stage 1 through stage 4 chronic kidney disease, or unspecified chronic kidney disease: Secondary | ICD-10-CM | POA: Diagnosis present

## 2015-04-26 DIAGNOSIS — H532 Diplopia: Secondary | ICD-10-CM | POA: Diagnosis present

## 2015-04-26 DIAGNOSIS — Z5112 Encounter for antineoplastic immunotherapy: Secondary | ICD-10-CM

## 2015-04-26 DIAGNOSIS — I2510 Atherosclerotic heart disease of native coronary artery without angina pectoris: Secondary | ICD-10-CM | POA: Diagnosis present

## 2015-04-26 DIAGNOSIS — E872 Acidosis: Secondary | ICD-10-CM | POA: Diagnosis present

## 2015-04-26 DIAGNOSIS — Z880 Allergy status to penicillin: Secondary | ICD-10-CM

## 2015-04-26 DIAGNOSIS — K921 Melena: Secondary | ICD-10-CM | POA: Diagnosis present

## 2015-04-26 DIAGNOSIS — E876 Hypokalemia: Secondary | ICD-10-CM | POA: Diagnosis present

## 2015-04-26 DIAGNOSIS — G9341 Metabolic encephalopathy: Secondary | ICD-10-CM | POA: Diagnosis not present

## 2015-04-26 DIAGNOSIS — M549 Dorsalgia, unspecified: Secondary | ICD-10-CM | POA: Diagnosis present

## 2015-04-26 DIAGNOSIS — R0902 Hypoxemia: Secondary | ICD-10-CM

## 2015-04-26 DIAGNOSIS — R55 Syncope and collapse: Secondary | ICD-10-CM | POA: Diagnosis present

## 2015-04-26 DIAGNOSIS — Z885 Allergy status to narcotic agent status: Secondary | ICD-10-CM

## 2015-04-26 DIAGNOSIS — Z8249 Family history of ischemic heart disease and other diseases of the circulatory system: Secondary | ICD-10-CM

## 2015-04-26 DIAGNOSIS — C099 Malignant neoplasm of tonsil, unspecified: Secondary | ICD-10-CM | POA: Diagnosis present

## 2015-04-26 DIAGNOSIS — Z808 Family history of malignant neoplasm of other organs or systems: Secondary | ICD-10-CM

## 2015-04-26 DIAGNOSIS — E782 Mixed hyperlipidemia: Secondary | ICD-10-CM | POA: Diagnosis present

## 2015-04-26 DIAGNOSIS — I509 Heart failure, unspecified: Secondary | ICD-10-CM

## 2015-04-26 DIAGNOSIS — Z888 Allergy status to other drugs, medicaments and biological substances status: Secondary | ICD-10-CM

## 2015-04-26 DIAGNOSIS — N182 Chronic kidney disease, stage 2 (mild): Secondary | ICD-10-CM | POA: Diagnosis present

## 2015-04-26 DIAGNOSIS — I213 ST elevation (STEMI) myocardial infarction of unspecified site: Principal | ICD-10-CM | POA: Diagnosis present

## 2015-04-26 DIAGNOSIS — I2102 ST elevation (STEMI) myocardial infarction involving left anterior descending coronary artery: Secondary | ICD-10-CM | POA: Diagnosis present

## 2015-04-26 DIAGNOSIS — Z886 Allergy status to analgesic agent status: Secondary | ICD-10-CM

## 2015-04-26 DIAGNOSIS — I255 Ischemic cardiomyopathy: Secondary | ICD-10-CM | POA: Diagnosis present

## 2015-04-26 DIAGNOSIS — Z79891 Long term (current) use of opiate analgesic: Secondary | ICD-10-CM

## 2015-04-26 DIAGNOSIS — I428 Other cardiomyopathies: Secondary | ICD-10-CM | POA: Insufficient documentation

## 2015-04-26 DIAGNOSIS — E871 Hypo-osmolality and hyponatremia: Secondary | ICD-10-CM | POA: Diagnosis present

## 2015-04-26 DIAGNOSIS — I10 Essential (primary) hypertension: Secondary | ICD-10-CM | POA: Diagnosis present

## 2015-04-26 DIAGNOSIS — Z7952 Long term (current) use of systemic steroids: Secondary | ICD-10-CM

## 2015-04-26 DIAGNOSIS — Z79899 Other long term (current) drug therapy: Secondary | ICD-10-CM

## 2015-04-26 DIAGNOSIS — N179 Acute kidney failure, unspecified: Secondary | ICD-10-CM | POA: Diagnosis present

## 2015-04-26 LAB — CBC
HEMATOCRIT: 45.9 % (ref 39.0–52.0)
HEMOGLOBIN: 16.5 g/dL (ref 13.0–17.0)
MCH: 34.2 pg — AB (ref 26.0–34.0)
MCHC: 35.9 g/dL (ref 30.0–36.0)
MCV: 95.2 fL (ref 78.0–100.0)
Platelets: 268 10*3/uL (ref 150–400)
RBC: 4.82 MIL/uL (ref 4.22–5.81)
RDW: 12.3 % (ref 11.5–15.5)
WBC: 13 10*3/uL — ABNORMAL HIGH (ref 4.0–10.5)

## 2015-04-26 LAB — COMPREHENSIVE METABOLIC PANEL
ALBUMIN: 4.1 g/dL (ref 3.5–5.2)
ALT: 94 U/L — AB (ref 0–53)
ANION GAP: 8 (ref 5–15)
AST: 59 U/L — ABNORMAL HIGH (ref 0–37)
Alkaline Phosphatase: 226 U/L — ABNORMAL HIGH (ref 39–117)
BUN: 15 mg/dL (ref 6–23)
CHLORIDE: 96 mmol/L (ref 96–112)
CO2: 20 mmol/L (ref 19–32)
CREATININE: 1.58 mg/dL — AB (ref 0.50–1.35)
Calcium: 9.6 mg/dL (ref 8.4–10.5)
GFR calc Af Amer: 51 mL/min — ABNORMAL LOW (ref >90)
GFR, EST NON AFRICAN AMERICAN: 44 mL/min — AB (ref >90)
Glucose, Bld: 169 mg/dL — ABNORMAL HIGH (ref 70–99)
POTASSIUM: 3.4 mmol/L — AB (ref 3.5–5.1)
Sodium: 124 mmol/L — ABNORMAL LOW (ref 135–145)
Total Bilirubin: 1 mg/dL (ref 0.3–1.2)
Total Protein: 8.1 g/dL (ref 6.0–8.3)

## 2015-04-26 MED ORDER — DEXAMETHASONE 6 MG PO TABS: 40.0000 mg | ORAL_TABLET | ORAL | Status: DC

## 2015-04-26 MED ORDER — SORBITOL 70 % SOLN: 30.0000 mL | Freq: Every day | Status: DC | PRN

## 2015-04-26 MED ORDER — SODIUM CHLORIDE 0.9 % IV SOLN
INTRAVENOUS | Status: DC
  Administered 2015-04-26: 18:00:00 via INTRAVENOUS

## 2015-04-26 MED ORDER — CYCLOBENZAPRINE HCL 5 MG PO TABS
5.0000 mg | ORAL_TABLET | Freq: Three times a day (TID) | ORAL | Status: DC
  Administered 2015-04-26 – 2015-04-27 (×4): 5 mg via ORAL
  Filled 2015-04-26 (×4): qty 1

## 2015-04-26 MED ORDER — SENNOSIDES-DOCUSATE SODIUM 8.6-50 MG PO TABS
2.0000 | ORAL_TABLET | Freq: Two times a day (BID) | ORAL | Status: DC
  Administered 2015-04-26 – 2015-05-01 (×8): 2 via ORAL
  Filled 2015-04-26 (×9): qty 2

## 2015-04-26 MED ORDER — SODIUM CHLORIDE 0.9 % IV SOLN
INTRAVENOUS | Status: DC
  Administered 2015-04-26 – 2015-04-27 (×2): via INTRAVENOUS

## 2015-04-26 MED ORDER — FAMOTIDINE 20 MG PO TABS
20.0000 mg | ORAL_TABLET | Freq: Two times a day (BID) | ORAL | Status: DC
  Administered 2015-04-26 – 2015-05-01 (×9): 20 mg via ORAL
  Filled 2015-04-26 (×9): qty 1

## 2015-04-26 MED ORDER — BORTEZOMIB CHEMO SQ INJECTION 3.5 MG (2.5MG/ML)
1.3000 mg/m2 | Freq: Once | INTRAMUSCULAR | Status: AC
  Administered 2015-04-26: 2.75 mg via SUBCUTANEOUS
  Filled 2015-04-26: qty 2.75

## 2015-04-26 MED ORDER — ACYCLOVIR 400 MG PO TABS
400.0000 mg | ORAL_TABLET | Freq: Two times a day (BID) | ORAL | Status: DC
  Administered 2015-04-26 – 2015-05-01 (×9): 400 mg via ORAL
  Filled 2015-04-26 (×9): qty 1

## 2015-04-26 MED ORDER — ACETAMINOPHEN 325 MG PO TABS
650.0000 mg | ORAL_TABLET | Freq: Four times a day (QID) | ORAL | Status: DC | PRN
  Administered 2015-04-28 – 2015-05-01 (×5): 650 mg via ORAL
  Filled 2015-04-26 (×4): qty 2

## 2015-04-26 MED ORDER — POTASSIUM CHLORIDE 20 MEQ/15ML (10%) PO SOLN
40.0000 meq | Freq: Three times a day (TID) | ORAL | Status: DC
  Administered 2015-04-26 – 2015-05-01 (×14): 40 meq via ORAL
  Filled 2015-04-26 (×14): qty 30

## 2015-04-26 MED ORDER — SENNA-DOCUSATE SODIUM 8.6-50 MG PO TABS: 2.0000 | ORAL_TABLET | Freq: Two times a day (BID) | ORAL | Status: DC

## 2015-04-26 MED ORDER — ONDANSETRON HCL 8 MG PO TABS
ORAL_TABLET | ORAL | Status: AC
  Filled 2015-04-26: qty 1

## 2015-04-26 MED ORDER — ENSURE ENLIVE PO LIQD
237.0000 mL | Freq: Two times a day (BID) | ORAL | Status: DC
Start: 2015-04-27 — End: 2015-05-01
  Administered 2015-04-27 – 2015-05-01 (×6): 237 mL via ORAL

## 2015-04-26 MED ORDER — ONDANSETRON HCL 8 MG PO TABS
8.0000 mg | ORAL_TABLET | Freq: Once | ORAL | Status: AC
  Administered 2015-04-26: 8 mg via ORAL

## 2015-04-26 MED ORDER — BISACODYL 5 MG PO TBEC: 10.0000 mg | DELAYED_RELEASE_TABLET | Freq: Every day | ORAL | Status: DC | PRN

## 2015-04-26 MED ORDER — OXYCODONE HCL 5 MG PO TABS
10.0000 mg | ORAL_TABLET | Freq: Four times a day (QID) | ORAL | Status: DC | PRN
  Administered 2015-04-28: 10 mg via ORAL
  Filled 2015-04-26: qty 2

## 2015-04-26 MED ORDER — OXYCODONE HCL ER 15 MG PO T12A
15.0000 mg | EXTENDED_RELEASE_TABLET | Freq: Two times a day (BID) | ORAL | Status: DC
  Administered 2015-04-26 – 2015-04-27 (×3): 15 mg via ORAL
  Filled 2015-04-26 (×3): qty 1

## 2015-04-26 MED ORDER — HEPARIN SODIUM (PORCINE) 5000 UNIT/ML IJ SOLN
5000.0000 [IU] | Freq: Three times a day (TID) | INTRAMUSCULAR | Status: DC
  Administered 2015-04-26: 5000 [IU] via SUBCUTANEOUS
  Filled 2015-04-26: qty 1

## 2015-04-26 MED ORDER — OXYCODONE HCL 5 MG PO TABS
10.0000 mg | ORAL_TABLET | Freq: Once | ORAL | Status: AC
  Administered 2015-04-26: 10 mg via ORAL
  Filled 2015-04-26: qty 2

## 2015-04-26 NOTE — Addendum Note (Signed)
Encounter addended by: Jacqulyn Liner, RN on: 04/26/2015  5:28 PM<BR>     Documentation filed: [employer] Properties Editor, Notes Section

## 2015-04-26 NOTE — Addendum Note (Signed)
Encounter addended by: Jacqulyn Liner, RN on: 04/26/2015  5:54 PM<BR>     Documentation filed: Orders, Dx Association, Inpatient [facility]

## 2015-04-26 NOTE — Addendum Note (Signed)
Encounter addended by: Jacqulyn Liner, RN on: 04/26/2015  5:38 PM<BR>     Documentation filed: Inpatient MAR

## 2015-04-26 NOTE — Progress Notes (Signed)
Per Dr. Sondra Come, started normal saline infusion at 75/ml/hr through patient's #22 gauge IV in his right wrist.  Transported patient to room 1425 with Vivien Rota, Nurse Transporter.  Claiborne Billings, RN was given the nursing home med list.

## 2015-04-26 NOTE — Addendum Note (Signed)
Encounter addended by: Norm Salt, RN on: 04/26/2015  4:26 PM<BR>     Documentation filed: Inpatient Document Flowsheet

## 2015-04-26 NOTE — Progress Notes (Unsigned)
Per Dr. Burr Medico, [place] to give Velcade injection today without labs.  Pt to be going to ER for admission.

## 2015-04-26 NOTE — Progress Notes (Signed)
  Radiation Oncology         [phone] ________________________________  Name: Eric Dudley MRN: [id number]  Date: 04/26/2015  DOB: [birth year]  SIMULATION AND TREATMENT PLANNING NOTE    ICD-9-CM ICD-10-CM   1. Multiple myeloma 203.00 C90.00     DIAGNOSIS:  Multiple myeloma  NARRATIVE:  The patient was brought to the [place] suite.  Identity was confirmed.  All relevant records and images related to the planned course of therapy were reviewed.  The patient freely provided informed written consent to proceed with treatment after reviewing the details related to the planned course of therapy. The consent form was witnessed and verified by the simulation staff.  Then, the patient was set-up in a stable reproducible  supine position for radiation therapy.  CT images were obtained.  Surface markings were placed.  The CT images were loaded into the planning software.  Then the target and avoidance structures were contoured.  Treatment planning then occurred.  The radiation prescription was entered and confirmed.  Then, I designed and supervised the construction of a total of 1 medically necessary complex treatment devices.  I have requested : 3D Simulation  I have requested a DVH of the following structures: left and right kidney, GTV/PTV.  I have ordered:dose calc.  PLAN:  The patient will receive 20 Gy in 10 fractions.  ________________________________  -----------------------------------  Blair Promise, PhD, MD

## 2015-04-26 NOTE — Addendum Note (Signed)
Encounter addended by: Norm Salt, RN on: 04/26/2015  4:51 PM<BR>     Documentation filed: Notes Section

## 2015-04-26 NOTE — Progress Notes (Signed)
Informed by transporter that something is going on in the ct simulation room.On entering the room Aldona Bar RN was all ready there getting patients vitals as Joellen Jersey RT came to get her.As the therapist were positioning patient on table the patient had a gasping sound and was diaphoretic stating he was having pain in his back and abdomen.Blood pressure dropped to 76/54 with pulse 104.On sitting patient up blood pressure increased to 130/55 P 71.placed on 2 liters of oxygen.Sats 95 to 100%.Patient eventually able to lie down once padding placed and pillow placed under legs.Proceeded to complete simulation without incident.Patient placed on stretcher and brought to nursing.Dr.Kinard updated patient's wife on status.Notified Thu RN for Dr.Feng and ask if she wanted to come down to see patient in [place].Both came down and upon assessment think it would be better to be admitted.Thu will administer the velcade here without labs.Called the emergency department to speak with charge nurse to give report and ask if there is a room availabel.Spoke with Stacy RN charge nurse, she suggested we get a direct admission as they were behind and had other patients that have been waiting 2 hours that needed assessment.I told her I would but if not I would call her back.She thanked me and said this would really help them out.

## 2015-04-26 NOTE — Addendum Note (Signed)
Encounter addended by: Jacqulyn Liner, RN on: 04/26/2015  6:23 PM<BR>     Documentation filed: Notes Section

## 2015-04-26 NOTE — Progress Notes (Signed)
Patient unable to tolerate simulation secondary to back pain.Has hydrocodone allergy so I had to go to pharmacy to get oxy ir order.Panin level "6' on 0 to 10 level.Patient brought from rehab center.

## 2015-04-26 NOTE — Progress Notes (Signed)
IV start order received from Dr. Sondra Come.  #22 gauge IV started in right wrist.  Blood return noted.  Flushed with 10 cc normal saline.  Secured with Tegaderm and tape.

## 2015-04-26 NOTE — H&P (Signed)
[facility] History and Physical  Eric Dudley UXL:[id number] DOB: 10-04-1951 DOA: 04/26/2015  Referring physician: Dr. Dwana Curd PCP: [name], MD  Specialists: Oncology  Chief Complaint: syncope  HPI:  64 y/o recently admitted by this physician 4/19 for acute back pain where he was found to have a pathological T12 fracture IR consulted and recommended kyphoplasty which was performed on 04/19/15.   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 Patient came to his regular scheduled injection of Velcade at the cancer center 4/29 and was being mapped for XRT and while being transferred from chair to table he was dropped down below usual 30. Subsequent to this he had an episode wherein he had severe pain and became unresponsive for about 20 seconds.   he states that he could hear what the nurses were saying at the time and he could understand what was going on but could not respond and felt uncomfortable and out of it. He however spontaneously woke subsequent to this   it was noted that his O2 sats were in the low 90s which is not usual for him and he does not have a typical oxygen requirement. He was placed on 2 L of nasal cannula  At the time - Chest pain, - cough, - fever, - chills, - blurred vision, - double vision, - unilateral weakness, - fall, - sore throat, - melena however has had little bit of stool that is tinged with blood after wiping He is completely back to his baseline when i assessed him in the radiation oncology suite      Review of Systems:   Past Medical History  Diagnosis Date  . Hypertension   . Dyslipidemia   . Tonsillar cancer 2006  . Cholelithiases    Past Surgical History  Procedure Laterality Date  . Appendectomy  1962  . Shoulder surgery  1999    left  . Knee surgery  2012    right   Social History:  History   Social History  Narrative    Allergies  Allergen Reactions  . Hydrochlorothiazide Rash and Palpitations  . Asa [Aspirin] Nausea Only  . Hydrocodone Itching and Other (See Comments)    Jittery, hyperactivity   . Amoxicillin Itching and Rash  . Indapamide Palpitations    Lightheadedness, dizziness    Family History  Problem Relation Age of Onset  . Cancer Mother 65    cervical cancer   . Cancer Father     unknown cancer   . Hypertension Sister   . Cancer Sister     melanoma     Prior to Admission medications   Medication Sig Start Date End Date Taking? Authorizing Provider  acetaminophen (TYLENOL) 325 MG tablet Take 2 tablets (650 mg total) by mouth every 6 (six) hours as needed for mild pain, fever or headache. 04/23/15   Modena Jansky, MD  acyclovir (ZOVIRAX) 400 MG tablet Take 1 tablet (400 mg total) by mouth 2 (two) times daily. 04/12/15   Truitt Merle, MD  Bisacodyl (DULCOLAX PO) Take 3 capsules by mouth daily as needed (for constipation).     Historical Provider, MD  cyclobenzaprine (FLEXERIL) 5 MG tablet Take 1 tablet (5 mg total) by mouth 3 (three) times daily. 04/23/15   Modena Jansky, MD  dexamethasone (DECADRON) 4 MG tablet Take 10 tablets (40 mg total) by mouth once  a week. Take 40 mg ( 10 tablets )  Once  A  Week  On  Day  Of  Velcade. 04/12/15   Truitt Merle, MD  ezetimibe-simvastatin (VYTORIN) 10-40 MG per tablet Take 1 tablet by mouth at bedtime.    Historical Provider, MD  lenalidomide (REVLIMID) 10 MG capsule Take 1 capsule (10 mg total) by mouth daily. Take for 21 days and rest 7 days. ICD-10 is C90.00. 04/05/15   Truitt Merle, MD  Multiple Vitamins-Minerals (CENTRUM SILVER ULTRA MENS PO) Take 1 tablet by mouth daily.    Historical Provider, MD  ondansetron (ZOFRAN) 8 MG tablet Take 1 tablet (8 mg total) by mouth 2 (two) times daily as needed for nausea or vomiting. 04/12/15   Truitt Merle, MD  OxyCODONE (OXYCONTIN) 15 mg T12A 12 hr tablet Take 1 tablet (15 mg total) by mouth every 12 (twelve)  hours. 04/23/15   Modena Jansky, MD  oxyCODONE 10 MG TABS Take 1 tablet (10 mg total) by mouth every 6 (six) hours as needed for moderate pain or severe pain. 04/23/15   Modena Jansky, MD  polyethylene glycol (MIRALAX / GLYCOLAX) packet Take 17 g by mouth 2 (two) times daily. 04/23/15   Modena Jansky, MD  potassium chloride 20 MEQ/15ML (10%) SOLN Take 30 mLs (40 mEq total) by mouth 3 (three) times daily. 04/23/15   Modena Jansky, MD  [place]    Historical Provider, MD  ranitidine (ZANTAC) 150 MG tablet Take 300 mg by mouth at bedtime.     Historical Provider, MD  sennosides-docusate sodium (SENOKOT-S) 8.6-50 MG tablet Take 2 tablets by mouth 2 (two) times daily.    Historical Provider, MD   Physical Exam: There were no vitals filed for this visit.   EOMI NCAT thick beard No pallor no icterus Neck soft supple S1-S2 no murmur rub or gallop Abdomen soft nontender nondistended no rebound Able to lift lower extremities 30 off of bed however I did not examine his back nor palpate his lower spine His sensory exam is bilaterally equal in the lower extremities   Labs on Admission:  Basic Metabolic Panel:  Recent Labs Lab 04/21/15 0452 04/22/15 0408 04/22/15 1630 04/23/15 0418 04/23/15 0841  NA 137 140 138 136 132*  K 2.6* 2.8* 2.6* 3.4* 3.1*  CL 106 106 105 106 104  CO2 '25 26 27 24 23  ' GLUCOSE 109* 111* 103* 111* 123*  BUN '20 15 11 11 10  ' CREATININE 1.63* 1.65* 1.52* 1.57* 1.70*  CALCIUM 9.3 9.4 9.3 9.4 9.4  MG 2.0  --   --   --   --    Liver Function Tests: No results for input(s): AST, ALT, ALKPHOS, BILITOT, PROT, ALBUMIN in the last 168 hours. No results for input(s): LIPASE, AMYLASE in the last 168 hours. No results for input(s): AMMONIA in the last 168 hours. CBC:  Recent Labs Lab 04/22/15 0408  WBC 9.2  HGB 14.1  HCT 42.0  MCV 98.6  PLT 219   Cardiac Enzymes: No results for input(s): CKTOTAL, CKMB, CKMBINDEX, TROPONINI in the  last 168 hours.  BNP (last 3 results) No results for input(s): BNP in the last 8760 hours.  ProBNP (last 3 results) No results for input(s): PROBNP in the last 8760 hours.  CBG: No results for input(s): GLUCAP in the last 168 hours.  Radiological Exams on Admission: No results found. None performed EKG: Independently reviewed.none   Assessment/Plan Principal Problem:  Syncope and collapse-patient had episode of discomfort and severe pain and then subsequently had a syncopal episode. This leads me to think that patient had a vasovagal event. Just to be completely sure I will order an EKG to make sure that this is the case. Patient will be monitored on telemetry. I would not get orthostatics at present time but he will need some hydration because of his relatively low blood pressure with saline 75 cc per hour for right now Patient will also need labs and now including CBC, complete metabolic panel And we will repeat these labs in the morning  I suspect if his pain is under reasonable control he can potentially be discharged home in the morning   Multiple myeloma with complication of lytic lesion T11-status post kyphoplasty 4/22-management as per radiation oncology. Continue suppressive acyclovir 400 twice a day, continue Decadron 40 mg with Velcade. Velcade will be held for right now [just received a dose at cancer Center/29]. 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 Patient to continue potassium based on further lab testing   Time spent: 55 Full code Dr. Luis Abed aware of admission Likely home am?  [place], [place] [facility] Pager [phone]  If 7PM-7AM, please contact night-coverage [email or web address] Password [facility] 04/26/2015, 6:06 PM

## 2015-04-26 NOTE — Addendum Note (Signed)
Encounter addended by: Norm Salt, RN on: 04/26/2015  5:17 PM<BR>     Documentation filed: Episodes

## 2015-04-27 ENCOUNTER — Encounter: Payer: Self-pay | Admitting: Hematology

## 2015-04-27 DIAGNOSIS — I429 Cardiomyopathy, unspecified: Secondary | ICD-10-CM | POA: Diagnosis not present

## 2015-04-27 DIAGNOSIS — Q244 Congenital subaortic stenosis: Secondary | ICD-10-CM | POA: Diagnosis not present

## 2015-04-27 DIAGNOSIS — E876 Hypokalemia: Secondary | ICD-10-CM | POA: Diagnosis present

## 2015-04-27 DIAGNOSIS — Z7952 Long term (current) use of systemic steroids: Secondary | ICD-10-CM | POA: Diagnosis not present

## 2015-04-27 DIAGNOSIS — I255 Ischemic cardiomyopathy: Secondary | ICD-10-CM | POA: Diagnosis present

## 2015-04-27 DIAGNOSIS — N179 Acute kidney failure, unspecified: Secondary | ICD-10-CM | POA: Diagnosis present

## 2015-04-27 DIAGNOSIS — I2510 Atherosclerotic heart disease of native coronary artery without angina pectoris: Secondary | ICD-10-CM | POA: Diagnosis not present

## 2015-04-27 DIAGNOSIS — C099 Malignant neoplasm of tonsil, unspecified: Secondary | ICD-10-CM | POA: Diagnosis not present

## 2015-04-27 DIAGNOSIS — C9000 Multiple myeloma not having achieved remission: Secondary | ICD-10-CM | POA: Diagnosis not present

## 2015-04-27 DIAGNOSIS — I10 Essential (primary) hypertension: Secondary | ICD-10-CM

## 2015-04-27 DIAGNOSIS — J9811 Atelectasis: Secondary | ICD-10-CM | POA: Diagnosis not present

## 2015-04-27 DIAGNOSIS — Z79899 Other long term (current) drug therapy: Secondary | ICD-10-CM | POA: Diagnosis not present

## 2015-04-27 DIAGNOSIS — I213 ST elevation (STEMI) myocardial infarction of unspecified site: Secondary | ICD-10-CM | POA: Diagnosis present

## 2015-04-27 DIAGNOSIS — Z886 Allergy status to analgesic agent status: Secondary | ICD-10-CM | POA: Diagnosis not present

## 2015-04-27 DIAGNOSIS — Z85818 Personal history of malignant neoplasm of other sites of lip, oral cavity, and pharynx: Secondary | ICD-10-CM | POA: Diagnosis not present

## 2015-04-27 DIAGNOSIS — H532 Diplopia: Secondary | ICD-10-CM | POA: Diagnosis present

## 2015-04-27 DIAGNOSIS — N182 Chronic kidney disease, stage 2 (mild): Secondary | ICD-10-CM | POA: Diagnosis present

## 2015-04-27 DIAGNOSIS — R05 Cough: Secondary | ICD-10-CM | POA: Diagnosis present

## 2015-04-27 DIAGNOSIS — Z8249 Family history of ischemic heart disease and other diseases of the circulatory system: Secondary | ICD-10-CM | POA: Diagnosis not present

## 2015-04-27 DIAGNOSIS — Z79891 Long term (current) use of opiate analgesic: Secondary | ICD-10-CM | POA: Diagnosis not present

## 2015-04-27 DIAGNOSIS — Z880 Allergy status to penicillin: Secondary | ICD-10-CM | POA: Diagnosis not present

## 2015-04-27 DIAGNOSIS — I2102 ST elevation (STEMI) myocardial infarction involving left anterior descending coronary artery: Secondary | ICD-10-CM | POA: Diagnosis not present

## 2015-04-27 DIAGNOSIS — Z885 Allergy status to narcotic agent status: Secondary | ICD-10-CM | POA: Diagnosis not present

## 2015-04-27 DIAGNOSIS — Z888 Allergy status to other drugs, medicaments and biological substances status: Secondary | ICD-10-CM | POA: Diagnosis not present

## 2015-04-27 DIAGNOSIS — R079 Chest pain, unspecified: Secondary | ICD-10-CM | POA: Diagnosis not present

## 2015-04-27 DIAGNOSIS — Z808 Family history of malignant neoplasm of other organs or systems: Secondary | ICD-10-CM | POA: Diagnosis not present

## 2015-04-27 DIAGNOSIS — I129 Hypertensive chronic kidney disease with stage 1 through stage 4 chronic kidney disease, or unspecified chronic kidney disease: Secondary | ICD-10-CM | POA: Diagnosis present

## 2015-04-27 DIAGNOSIS — E785 Hyperlipidemia, unspecified: Secondary | ICD-10-CM

## 2015-04-27 DIAGNOSIS — E872 Acidosis: Secondary | ICD-10-CM | POA: Diagnosis present

## 2015-04-27 DIAGNOSIS — G9341 Metabolic encephalopathy: Secondary | ICD-10-CM | POA: Diagnosis not present

## 2015-04-27 DIAGNOSIS — E871 Hypo-osmolality and hyponatremia: Secondary | ICD-10-CM | POA: Diagnosis present

## 2015-04-27 DIAGNOSIS — I200 Unstable angina: Secondary | ICD-10-CM

## 2015-04-27 DIAGNOSIS — K921 Melena: Secondary | ICD-10-CM | POA: Diagnosis present

## 2015-04-27 DIAGNOSIS — R55 Syncope and collapse: Secondary | ICD-10-CM | POA: Diagnosis not present

## 2015-04-27 DIAGNOSIS — M549 Dorsalgia, unspecified: Secondary | ICD-10-CM | POA: Diagnosis not present

## 2015-04-27 LAB — COMPREHENSIVE METABOLIC PANEL
ALT: 80 U/L — AB (ref 0–53)
AST: 48 U/L — ABNORMAL HIGH (ref 0–37)
Albumin: 3.8 g/dL (ref 3.5–5.2)
Alkaline Phosphatase: 203 U/L — ABNORMAL HIGH (ref 39–117)
Anion gap: 10 (ref 5–15)
BILIRUBIN TOTAL: 0.7 mg/dL (ref 0.3–1.2)
BUN: 15 mg/dL (ref 6–23)
CALCIUM: 10.1 mg/dL (ref 8.4–10.5)
CO2: 21 mmol/L (ref 19–32)
Chloride: 99 mmol/L (ref 96–112)
Creatinine, Ser: 1.44 mg/dL — ABNORMAL HIGH (ref 0.50–1.35)
GFR calc Af Amer: 57 mL/min — ABNORMAL LOW (ref >90)
GFR, EST NON AFRICAN AMERICAN: 50 mL/min — AB (ref >90)
GLUCOSE: 89 mg/dL (ref 70–99)
Potassium: 3.3 mmol/L — ABNORMAL LOW (ref 3.5–5.1)
Sodium: 130 mmol/L — ABNORMAL LOW (ref 135–145)
Total Protein: 7.4 g/dL (ref 6.0–8.3)

## 2015-04-27 LAB — CBC
HCT: 44.7 % (ref 39.0–52.0)
HEMOGLOBIN: 15.7 g/dL (ref 13.0–17.0)
MCH: 33.4 pg (ref 26.0–34.0)
MCHC: 35.1 g/dL (ref 30.0–36.0)
MCV: 95.1 fL (ref 78.0–100.0)
PLATELETS: 244 10*3/uL (ref 150–400)
RBC: 4.7 MIL/uL (ref 4.22–5.81)
RDW: 12.4 % (ref 11.5–15.5)
WBC: 13.6 10*3/uL — ABNORMAL HIGH (ref 4.0–10.5)

## 2015-04-27 LAB — TROPONIN I
TROPONIN I: 2.96 ng/mL — AB (ref <0.031)
Troponin I: 3.85 ng/mL (ref <0.031)
Troponin I: 3.27 ng/mL (ref <0.031)
Troponin I: 2.51 ng/mL (ref <0.031)
Troponin I: 2.35 ng/mL (ref <0.031)

## 2015-04-27 LAB — HEPARIN LEVEL (UNFRACTIONATED)
HEPARIN UNFRACTIONATED: 0.22 [IU]/mL — AB (ref 0.30–0.70)
Heparin Unfractionated: 0.57 IU/mL (ref 0.30–0.70)
Heparin Unfractionated: 0.56 IU/mL (ref 0.30–0.70)

## 2015-04-27 LAB — PROTIME-INR
INR: 1.06 (ref 0.00–1.49)
INR: 1.05 (ref 0.00–1.49)
PROTHROMBIN TIME: 13.8 s (ref 11.6–15.2)
Prothrombin Time: 13.9 seconds (ref 11.6–15.2)

## 2015-04-27 LAB — APTT: aPTT: 28 seconds (ref 24–37)

## 2015-04-27 MED ORDER — HEPARIN (PORCINE) IN NACL 100-0.45 UNIT/ML-% IJ SOLN
1100.0000 [IU]/h | INTRAMUSCULAR | Status: AC
  Administered 2015-04-27: 900 [IU]/h via INTRAVENOUS
  Administered 2015-04-27 (×2): 1100 [IU]/h via INTRAVENOUS
  Filled 2015-04-27 (×2): qty 250

## 2015-04-27 MED ORDER — HEPARIN BOLUS VIA INFUSION
2000.0000 [IU] | Freq: Once | INTRAVENOUS | Status: AC
  Administered 2015-04-27: 2000 [IU] via INTRAVENOUS
  Filled 2015-04-27: qty 2000

## 2015-04-27 MED ORDER — METOPROLOL TARTRATE 25 MG PO TABS
12.5000 mg | ORAL_TABLET | Freq: Two times a day (BID) | ORAL | Status: DC
  Administered 2015-04-27 (×2): 12.5 mg via ORAL
  Filled 2015-04-27: qty 1

## 2015-04-27 MED ORDER — ASPIRIN EC 325 MG PO TBEC
325.0000 mg | DELAYED_RELEASE_TABLET | Freq: Every day | ORAL | Status: DC
  Administered 2015-04-28 – 2015-04-29 (×2): 325 mg via ORAL
  Filled 2015-04-27 (×2): qty 1

## 2015-04-27 MED ORDER — ASPIRIN 81 MG PO CHEW
CHEWABLE_TABLET | ORAL | Status: AC
  Filled 2015-04-27: qty 4

## 2015-04-27 MED ORDER — ASPIRIN 81 MG PO CHEW
324.0000 mg | CHEWABLE_TABLET | Freq: Once | ORAL | Status: AC
  Administered 2015-04-27: 324 mg via ORAL

## 2015-04-27 MED ORDER — HEPARIN BOLUS VIA INFUSION
1000.0000 [IU] | Freq: Once | INTRAVENOUS | Status: AC
  Administered 2015-04-27: 1000 [IU] via INTRAVENOUS
  Filled 2015-04-27: qty 1000

## 2015-04-27 MED ORDER — ASPIRIN EC 325 MG PO TBEC: 325.0000 mg | DELAYED_RELEASE_TABLET | Freq: Every day | ORAL | Status: DC

## 2015-04-27 MED ORDER — ATORVASTATIN CALCIUM 40 MG PO TABS
40.0000 mg | ORAL_TABLET | Freq: Every day | ORAL | Status: DC
  Administered 2015-04-27 – 2015-04-30 (×5): 40 mg via ORAL
  Filled 2015-04-27 (×4): qty 1

## 2015-04-27 NOTE — Care Management (Signed)
Utilization Review completed.  

## 2015-04-27 NOTE — Progress Notes (Signed)
[place] for IV Heparin Indication: Elevated troponin  Allergies  Allergen Reactions  . Hydrochlorothiazide Rash and Palpitations  . Asa [Aspirin] Nausea Only  . Hydrocodone Itching and Other (See Comments)    Jittery, hyperactivity   . Amoxicillin Itching and Rash  . Indapamide Palpitations    Lightheadedness, dizziness   Patient Measurements: Height: _0  (172.7 cm) Weight: 192 lb 11.2 oz (87.408 kg) IBW/kg (Calculated) : 68.4 Heparin Dosing Weight: 74 kg  Vital Signs: Temp: 97.6 F (36.4 C) (04/30 2117) Temp Source: Oral (04/30 2117) BP: 101/55 mmHg (04/30 2117) Pulse Rate: 63 (04/30 2117)  Labs:  Recent Labs  04/26/15 1932  04/27/15 0138 04/27/15 0406 04/27/15 0750 04/27/15 1159 04/27/15 1610 04/27/15 2204  HGB 16.5  --   --  15.7  --   --   --   --   HCT 45.9  --   --  44.7  --   --   --   --   PLT 268  --   --  244  --   --   --   --   APTT  --   --  28  --   --   --   --   --   LABPROT  --   --  13.9 13.8  --   --   --   --   INR  --   --  1.06 1.05  --   --   --   --   HEPARINUNFRC  --   --   --   --  0.22*  --  0.57 0.56  CREATININE 1.58*  --   --  1.44*  --   --   --   --   TROPONINI  --   < >  --  3.27*  --  2.96* 2.51*  --   < > = values in this interval not displayed. Estimated Creatinine Clearance: 55 mL/min (by C-G formula based on Cr of 1.44).  Medical History: Past Medical History  Diagnosis Date  . Hypertension   . Dyslipidemia   . Tonsillar cancer 2006  . Cholelithiases    Scheduled:  . acyclovir  400 mg Oral BID  . [START ON 04/28/2015] aspirin EC  325 mg Oral Daily  . atorvastatin  40 mg Oral q1800  . cyclobenzaprine  5 mg Oral TID  . [START ON 05/03/2015] dexamethasone  40 mg Oral Weekly  . famotidine  20 mg Oral BID  . feeding supplement (ENSURE ENLIVE)  237 mL Oral BID BM  . metoprolol tartrate  12.5 mg Oral BID  . OxyCODONE  15 mg Oral Q12H  . potassium chloride  40 mEq Oral TID  .  senna-docusate  2 tablet Oral BID   Infusions:  . sodium chloride 75 mL/hr at 04/27/15 2150  . heparin 1,100 Units/hr (04/27/15 2148)   Assessment: 39 yoM admitted with syncope.  Pt with hx of multiple myeloma now with Trop=3.85.  IV Heparin per Rx for elevated troponin. EKG shows T wave abnormalities, STEMI.  Heparin 2000 unit bolus x1 (0200)  Started drip @ 900 units/hr  Today 04/27/2015  First Heparin level 0.22, sub therapeutic - 1000 unit bolus then increase rate to 1100 units/hr  2204 HL=0.56 (x2 therapeutic)  No reported bleeding or problems with heparin reported from RN  Goal of Therapy:  Heparin level 0.3-0.7 units/ml Monitor platelets by anticoagulation protocol: Yes   Plan:  Continue Heparin at 1100 units/hr  Daily CBC/HL    Dorrene German 04/27/2015 10:48 PM

## 2015-04-27 NOTE — Progress Notes (Signed)
Repeat EKG. Shoes Acute MI. MD on unit. Spoke with him give 4 baby asa.......B/P 102/ hr 81... Pt reports feeling a grabbing feeling. No radiating at this time

## 2015-04-27 NOTE — Progress Notes (Signed)
ANTICOAGULATION CONSULT NOTE - Initial Consult  Pharmacy Consult for IV Heparin Indication: Elevated troponin  Allergies  Allergen Reactions  . Hydrochlorothiazide Rash and Palpitations  . Asa [Aspirin] Nausea Only  . Hydrocodone Itching and Other (See Comments)    Jittery, hyperactivity   . Amoxicillin Itching and Rash  . Indapamide Palpitations    Lightheadedness, dizziness    Patient Measurements: Height: '5\' 8"'  (172.7 cm) Weight: 192 lb 11.2 oz (87.408 kg) IBW/kg (Calculated) : 68.4 Heparin Dosing Weight: 74 kg  Vital Signs: Temp: 98.1 F (36.7 C) (04/29 2057) Temp Source: Oral (04/29 2057) BP: 111/66 mmHg (04/29 2057) Pulse Rate: 102 (04/29 2057)  Labs:  Recent Labs  04/26/15 1932 04/26/15 2330  HGB 16.5  --   HCT 45.9  --   PLT 268  --   CREATININE 1.58*  --   TROPONINI  --  3.85*    Estimated Creatinine Clearance: 50.1 mL/min (by C-G formula based on Cr of 1.58).   Medical History: Past Medical History  Diagnosis Date  . Hypertension   . Dyslipidemia   . Tonsillar cancer 2006  . Cholelithiases     Medications:  Prescriptions prior to admission  Medication Sig Dispense Refill Last Dose  . acetaminophen (TYLENOL) 325 MG tablet Take 2 tablets (650 mg total) by mouth every 6 (six) hours as needed for mild pain, fever or headache.   unknown  . acyclovir (ZOVIRAX) 400 MG tablet Take 1 tablet (400 mg total) by mouth 2 (two) times daily. 180 tablet 1 04/26/2015 at am  . Bisacodyl (DULCOLAX PO) Take 3 capsules by mouth daily as needed (for constipation).    unknown  . cyclobenzaprine (FLEXERIL) 5 MG tablet Take 1 tablet (5 mg total) by mouth 3 (three) times daily. 30 tablet 0 04/26/2015 at 1300  . dexamethasone (DECADRON) 4 MG tablet Take 10 tablets (40 mg total) by mouth once a week. Take 40 mg ( 10 tablets )  Once  A  Week  On  Day  Of  Velcade. 120 tablet 1 04/26/2015 at Unknown time  . ezetimibe-simvastatin (VYTORIN) 10-40 MG per tablet Take 1 tablet by  mouth at bedtime.   04/25/2015 at Unknown time  . lenalidomide (REVLIMID) 10 MG capsule Take 1 capsule (10 mg total) by mouth daily. Take for 21 days and rest 7 days. ICD-10 is C90.00. 21 capsule 0 04/26/2015 at Unknown time  . Multiple Vitamins-Minerals (CENTRUM SILVER ULTRA MENS PO) Take 1 tablet by mouth daily.   04/26/2015 at Unknown time  . ondansetron (ZOFRAN) 8 MG tablet Take 1 tablet (8 mg total) by mouth 2 (two) times daily as needed for nausea or vomiting. 60 tablet 1 unknown at Unknown time  . OxyCODONE (OXYCONTIN) 15 mg T12A 12 hr tablet Take 1 tablet (15 mg total) by mouth every 12 (twelve) hours. 10 tablet 0 04/26/2015 at Unknown time  . oxyCODONE 10 MG TABS Take 1 tablet (10 mg total) by mouth every 6 (six) hours as needed for moderate pain or severe pain. 20 tablet 0 unknown  . polyethylene glycol (MIRALAX / GLYCOLAX) packet Take 17 g by mouth 2 (two) times daily.   04/26/2015 at Unknown time  . potassium chloride 20 MEQ/15ML (10%) SOLN Take 30 mLs (40 mEq total) by mouth 3 (three) times daily. 450 mL 0 04/26/2015 at 1300  . PRESCRIPTION MEDICATION Chemo - [place]   04/26/2015 at Unknown time  . ranitidine (ZANTAC) 150 MG tablet Take 300 mg by mouth at bedtime.  04/25/2015 at Unknown time  . sennosides-docusate sodium (SENOKOT-S) 8.6-50 MG tablet Take 2 tablets by mouth 2 (two) times daily.   04/26/2015 at Unknown time  . spironolactone (ALDACTONE) 25 MG tablet Take 12.5 mg by mouth daily.   04/26/2015 at Unknown time   Scheduled:  . acyclovir  400 mg Oral BID  . cyclobenzaprine  5 mg Oral TID  . [START ON 05/03/2015] dexamethasone  40 mg Oral Weekly  . famotidine  20 mg Oral BID  . feeding supplement (ENSURE ENLIVE)  237 mL Oral BID BM  . OxyCODONE  15 mg Oral Q12H  . potassium chloride  40 mEq Oral TID  . senna-docusate  2 tablet Oral BID   Infusions:  . sodium chloride 75 mL/hr at 04/26/15 2039  . heparin 900 Units/hr (04/27/15 0152)    Assessment: 13 yoM admitted with syncope.  Pt  with hx of multiple myeloma now with Trop=3.85.  IV Heparin per Rx for elevated troponin.  Goal of Therapy:  Heparin level 0.3-0.7 units/ml Monitor platelets by anticoagulation protocol: Yes   Plan:   Heparin 2000 unit bolus x1  Start drip @ 900 units/hr  Daily CBC/HL  Check 1st HL 0800.  Lawana Pai R 04/27/2015,2:00 AM

## 2015-04-27 NOTE — Consult Note (Addendum)
CARDIOLOGY CONSULT NOTE  Patient ID: Eric Dudley MRN: [id number] DOB/AGE: Sep 17, 1951 64 y.o.  Admit date: 04/26/2015 Primary Physician [name], MD  Reason for Consultation: Acute coronary syndrome  HPI: The patient is a 64 yr old male with a PMH significant for multiple myeloma, essential HTN, hyperlipidemia, hypokalemia, CKD stage II, and tonsillar CA (s/p surgery and radiation treatment) who was admitted earlier this month for acute back pain and was found to have a pathological T12 fracture with subsequent kyphoplasty. He also has a lytic L3 lesion. Nuclear scan on 03/27/14 showed single focus of increased uptake within the anterior medial left 5th rib. He was at the cancer center on 4/29 and experienced severe pain and went unresponsive for 20 seconds, deemed to be a vagally mediated syncopal episode. O2 sats were noted to drop below 90% which is unusual for him as he has no known pulmonary disease. He underwent a normal exercise treadmill stress test on 10/16/14.  An ECG and troponins were ordered. Troponins found to be elevated at 3.85-->3.27. Heparin was started and ASA was administered. ECG demonstrated sinus rhythm with 0.5 mm ST segment elevations in I, II, aVL, and V1-6 with coving ST segments and precordial T wave inversion.  HR currently in 75-85 bpm range, BP 102/58.   Creatinine 1.44 on 4/30, K 3.3.  He says he's had intermittent "grabbing chest pain" across both sides of his chest simultaneously for the past 20 years, but they have become more frequent in the last few weeks, lasting only seconds. He has right-sided chest and back pain when rolling to the right, and left-sided chest and back pain when rolling to the left. Denies shortness of breath, palpitations, orthopnea, dizziness/lightheadedness, and leg swelling. Denies nausea and vomiting. Says he feels quite well otherwise and resting comfortably and eating breakfast. Wife is  present.     Allergies  Allergen Reactions  . Hydrochlorothiazide Rash and Palpitations  . Asa [Aspirin] Nausea Only  . Hydrocodone Itching and Other (See Comments)    Jittery, hyperactivity   . Amoxicillin Itching and Rash  . Indapamide Palpitations    Lightheadedness, dizziness    Current Facility-Administered Medications  Medication Dose Route Frequency Provider Last Rate Last Dose  . 0.9 %  sodium chloride infusion   Intravenous Continuous Nita Sells, MD 75 mL/hr at 04/26/15 2039    . acetaminophen (TYLENOL) tablet 650 mg  650 mg Oral Q6H PRN Nita Sells, MD      . acyclovir (ZOVIRAX) tablet 400 mg  400 mg Oral BID Nita Sells, MD   400 mg at 04/27/15 0947  . [START ON 04/28/2015] aspirin EC tablet 325 mg  325 mg Oral Daily Nita Sells, MD      . bisacodyl (DULCOLAX) EC tablet 10 mg  10 mg Oral Daily PRN Nita Sells, MD      . cyclobenzaprine (FLEXERIL) tablet 5 mg  5 mg Oral TID Nita Sells, MD   5 mg at 04/27/15 0947  . [START ON 05/03/2015] dexamethasone (DECADRON) tablet 40 mg  40 mg Oral Weekly Nita Sells, MD      . famotidine (PEPCID) tablet 20 mg  20 mg Oral BID Nita Sells, MD   20 mg at 04/27/15 0947  . feeding supplement (ENSURE ENLIVE) (ENSURE ENLIVE) liquid 237 mL  237 mL Oral BID BM Nita Sells, MD   237 mL at 04/27/15 0947  . heparin ADULT infusion 100 units/mL (25000 units/250 mL)  1,100 Units/hr Intravenous Continuous  Minda Ditto, RPH 11 mL/hr at 04/27/15 0950 1,100 Units/hr at 04/27/15 0950  . oxyCODONE (Oxy IR/ROXICODONE) immediate release tablet 10 mg  10 mg Oral Q6H PRN Nita Sells, MD      . OxyCODONE (OXYCONTIN) 12 hr tablet 15 mg  15 mg Oral Q12H Nita Sells, MD   15 mg at 04/27/15 0947  . potassium chloride 20 MEQ/15ML (10%) solution 40 mEq  40 mEq Oral TID Nita Sells, MD   40 mEq at 04/27/15 0947  . senna-docusate (Senokot-S) tablet 2 tablet  2 tablet Oral BID  Nita Sells, MD   2 tablet at 04/27/15 0947  . sorbitol 70 % solution 30 mL  30 mL Oral Daily PRN Nita Sells, MD        Past Medical History  Diagnosis Date  . Hypertension   . Dyslipidemia   . Tonsillar cancer 2006  . Cholelithiases     Past Surgical History  Procedure Laterality Date  . Appendectomy  1962  . Shoulder surgery  1999    left  . Knee surgery  2012    right    History   Social History  . Marital Status: Married    Spouse Name: N/A  . Number of Children: N/A  . Years of Education: N/A   Occupational History  . Not on file.   Social History Main Topics  . Smoking status: Former Smoker    Types: Cigars    Quit date: 12/29/2003  . Smokeless tobacco: Former Systems developer    Types: Chew    Quit date: 12/28/1994  . Alcohol Use: Yes     Comment: occasional beer  . Drug Use: No  . Sexual Activity: Not on file   Other Topics Concern  . Not on file   Social History Narrative     No family history of premature CAD in 1st degree relatives.  Prior to Admission medications   Medication Sig Start Date End Date Taking? Authorizing Provider  acetaminophen (TYLENOL) 325 MG tablet Take 2 tablets (650 mg total) by mouth every 6 (six) hours as needed for mild pain, fever or headache. 04/23/15  Yes Modena Jansky, MD  acyclovir (ZOVIRAX) 400 MG tablet Take 1 tablet (400 mg total) by mouth 2 (two) times daily. 04/12/15  Yes Truitt Merle, MD  Bisacodyl (DULCOLAX PO) Take 3 capsules by mouth daily as needed (for constipation).    Yes Historical Provider, MD  cyclobenzaprine (FLEXERIL) 5 MG tablet Take 1 tablet (5 mg total) by mouth 3 (three) times daily. 04/23/15  Yes Modena Jansky, MD  dexamethasone (DECADRON) 4 MG tablet Take 10 tablets (40 mg total) by mouth once a week. Take 40 mg ( 10 tablets )  Once  A  Week  On  Day  Of  Velcade. 04/12/15  Yes Truitt Merle, MD  ezetimibe-simvastatin (VYTORIN) 10-40 MG per tablet Take 1 tablet by mouth at bedtime.   Yes  Historical Provider, MD  lenalidomide (REVLIMID) 10 MG capsule Take 1 capsule (10 mg total) by mouth daily. Take for 21 days and rest 7 days. ICD-10 is C90.00. 04/05/15  Yes Truitt Merle, MD  Multiple Vitamins-Minerals (CENTRUM SILVER ULTRA MENS PO) Take 1 tablet by mouth daily.   Yes Historical Provider, MD  ondansetron (ZOFRAN) 8 MG tablet Take 1 tablet (8 mg total) by mouth 2 (two) times daily as needed for nausea or vomiting. 04/12/15  Yes Truitt Merle, MD  OxyCODONE (OXYCONTIN) 15 mg T12A 12 hr tablet Take  1 tablet (15 mg total) by mouth every 12 (twelve) hours. 04/23/15  Yes Modena Jansky, MD  oxyCODONE 10 MG TABS Take 1 tablet (10 mg total) by mouth every 6 (six) hours as needed for moderate pain or severe pain. 04/23/15  Yes Modena Jansky, MD  polyethylene glycol (MIRALAX / GLYCOLAX) packet Take 17 g by mouth 2 (two) times daily. 04/23/15  Yes Modena Jansky, MD  potassium chloride 20 MEQ/15ML (10%) SOLN Take 30 mLs (40 mEq total) by mouth 3 (three) times daily. 04/23/15  Yes Modena Jansky, MD  [place]   Yes Historical Provider, MD  ranitidine (ZANTAC) 150 MG tablet Take 300 mg by mouth at bedtime.    Yes Historical Provider, MD  sennosides-docusate sodium (SENOKOT-S) 8.6-50 MG tablet Take 2 tablets by mouth 2 (two) times daily.   Yes Historical Provider, MD  spironolactone (ALDACTONE) 25 MG tablet Take 12.5 mg by mouth daily.   Yes Historical Provider, MD     Review of systems complete and found to be negative unless listed above in HPI     Physical exam Blood pressure 102/58, pulse 81, temperature 98.3 F (36.8 C), temperature source Oral, resp. rate 18, height '5\' 8"'  (1.727 m), weight 192 lb 11.2 oz (87.408 kg), SpO2 95 %. General: NAD Neck: No JVD, no thyromegaly or thyroid nodule.  Lungs: Clear to auscultation bilaterally with normal respiratory effort. CV: Nondisplaced PMI. Regular rate and rhythm, normal S1/S2, no J0/K9, 2/6 systolic murmur along left  sternal border and 1/6 apical holosystolic murmur.  No peripheral edema.  No carotid bruit.  Normal pedal pulses.  Abdomen: Soft, nontender, no hepatosplenomegaly, no distention.  Skin: Intact without lesions or rashes.  Neurologic: Alert and oriented x 3.  Psych: Normal affect. Extremities: No clubbing or cyanosis.  HEENT: Normal.   ECG: Most recent ECG reviewed.  Labs:   Lab Results  Component Value Date   WBC 13.6* 04/27/2015   HGB 15.7 04/27/2015   HCT 44.7 04/27/2015   MCV 95.1 04/27/2015   PLT 244 04/27/2015    Recent Labs Lab 04/27/15 0406  NA 130*  K 3.3*  CL 99  CO2 21  BUN 15  CREATININE 1.44*  CALCIUM 10.1  PROT 7.4  BILITOT 0.7  ALKPHOS 203*  ALT 80*  AST 48*  GLUCOSE 89   Lab Results  Component Value Date   TROPONINI 3.27* 04/27/2015   No results found for: CHOL No results found for: HDL No results found for: LDLCALC No results found for: TRIG No results found for: CHOLHDL No results found for: LDLDIRECT       Studies: Dg Chest Port 1 View  04/26/2015   CLINICAL DATA:  Hypoxia.  Tonsillar carcinoma.  Multiple myeloma.  EXAM: PORTABLE CHEST - 1 VIEW  COMPARISON:  None.  FINDINGS: The heart size and mediastinal contours are within normal limits. Mild linear opacity in the left lung base may be due to scarring or atelectasis. No evidence of pulmonary airspace disease or pleural effusion.  IMPRESSION: Mild left basilar atelectasis versus scarring.   Electronically Signed   By: Earle Gell M.D.   On: 04/26/2015 19:17    ASSESSMENT AND PLAN:  1. Acute coronary syndrome with mild ST elevations: Pt appears to be entirely asymptomatic with very atypical chest pains which are similar to what he has experienced for past 20 years, lasting only seconds, and unlikely to be related to cardiac disease. That being said, he  has a markedly abnormal ECG and elevated troponins which are trending down and are being cycled. He is already on ASA and heparin. No plans for  coronary angiography given elevated creatinine, unless he were to develop worsening (and different) chest pain or becomes hemodynamically unstable with worsening ECG changes. I will obtain an echocardiogram to assess LV function and valvular pathology given his murmur. Hgb is normal. I will start low-dose metoprolol 12.5 mg bid and start Lipitor 40 mg daily. I will repeat serial ECG's, particularly if he has new chest pains. If LV function and regional wall motion are normal and troponins continue to trend down, I would recommend a nuclear myocardial perfusion study in the near future.  2. Essential HTN: BP low normal. Will monitor given institution of low-dose metoprolol.  3. Hyperlipidemia: Will switch Vytorin to high intensity statin therapy with Lipitor 40 mg daily.  4. 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   Signed: Kate Sable, M.D., F.A.C.C.  04/27/2015, 10:11 AM

## 2015-04-27 NOTE — Progress Notes (Signed)
CRITICAL VALUE ALERT  Critical value received:  Trop 3.27  Date of notification:  04/27/15  Time of notification:  0520  Critical value read back:Yes.    Nurse who received alert:  Ruben Im, RN  MD notified (1st page):  Jonette Eva  Time of first page:  0525  MD notified (2nd page):  Time of second page:  Responding MD:  Jonette Eva  Time MD responded:  [phone]

## 2015-04-27 NOTE — Progress Notes (Addendum)
[place]  [phone] [phone] [place] Note   Patient Care Team: [name], MD as PCP - General (Internal Medicine) 04/26/2015  CHIEF COMPLAINTS: Follow-up multiple myeloma    Multiple myeloma   01/31/2015 Tumor Marker SPEP showed M-SPIKE 1.7g/dl, Cr 1.5, no anemia, hypercalcemia   03/19/2015 Imaging Bone survey showed no discrete lytic lesion, but there is osteopenia and calvarial heterogeneity.   03/27/2015 Initial Diagnosis Multiple myeloma, stage I   03/27/2015 Bone Marrow Biopsy Bone Marrow, Aspirate,Biopsy: - HYPERCELLULAR BONE MARROW FOR AGE WITH PLASMA CELL NEOPLASM 47% - SEE COMMENT. PERIPHERAL BLOOD: - NO SIGNIFICANT MORPHOLOGIC ABNORMALITIES. Diagnosis Note The bone marrow shows increased number of    03/27/2015 Miscellaneous bone marrow Cytogenetics: normal. FISH panel showed the presence of +4 and +11, this is consider standard risk of MM    04/16/2015 - 04/23/2015 Hospital Admission He was admitted for worsening back pain, was found to have a T12 pathological fracture. He underwent kyphoplasty on 04/19/2015.   04/19/2015 -  Chemotherapy RVD with weekly Velcade 1.3 mg/m, dexamethasone 40 mg, and Revlimid 10 mg daily on day 1-21, every 28 days     HISTORY OF PRESENTING ILLNESS:  Eric Dudley 64 y.o. 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  He has been having low back pain for 3-4 months. He had food posinin 4 month ago, and had frequent diarrhea. He started noticed low back pain since then. The pain is not radiating to leg, he also has intermittent right rib pain, worse with cough and sneezing. He remains to be physically active, able to do all the activities, such as gardening work housework without much limitation, but he doesn't sings slowly because of back pain. He takes Tylenol as needed, does  not like the necrotic pain medication. No night sweats, no fever or chillss, no weight loss.   He was seen by his primary care physician Dr. [name], MD and had lab test (see below). He also had bone density scan 2/25 which showed osteoporosis, has not been treated yet.  Current therapy: Weekly Velcade 1.3 mg/m and dexamethasone 40 mg, Revlimid 10 mg daily on day 1-21, every 28 days, started on 04/19/2059  INTERIM HISTORY: Eric Dudley returns for follow-up. He presented to [facility] emergency room on April 16 2015 for worsening back pain. He was found to have a T12 pathological fracture and multiple myeloma-related bone lesions. He underwent kyphoplasty during the hospital stay, and his back pain has improved. He was discharged to a nursing home on 04/23/2015. He returns today for CT simulation. When he was laying flat on the table for simulation, he had a severe back pain, was given oxycodone 10 mg, and had an episode of syncope and hypotension. That was resolved spontaneously, no residual neuro symptoms afterwards. I saw him in the radiation department, he was quite sleepy, still has significant back and rib pain, VS was stable.  MEDICAL HISTORY:  Past Medical History  Diagnosis Date  . Hypertension   . Dyslipidemia   . Tonsillar cancer 2006  . Cholelithiases     SURGICAL HISTORY: Past Surgical History  Procedure Laterality Date  . Appendectomy  1962  . Shoulder surgery  1999    left  . Knee surgery  2012    right    SOCIAL HISTORY: History   Social History  .  Marital Status: Married    Spouse Name: N/A  . Number of Children: 1   . Years of Education: N/A   Occupational History  . A retired Therapist, occupational    Social History Main Topics  . Smoking status: Former Smoker    Types: Cigars    Quit date: 12/29/2003  . Smokeless tobacco: Former Systems developer    Types: Chew    Quit date: 12/28/1994  . Alcohol Use: Yes     Comment: occasional beer  . Drug Use: No  .  Sexual Activity: Not on file   Other Topics Concern  . Not on file   Social History Narrative  . No narrative on file    FAMILY HISTORY: Family History  Problem Relation Age of Onset  . Cancer Mother 33    cervical cancer   . Cancer Father     unknown cancer   . Hypertension Sister   . Cancer Sister     melanoma     ALLERGIES:  is allergic to hydrochlorothiazide; asa; hydrocodone; amoxicillin; and indapamide.  MEDICATIONS:  No current facility-administered medications for this visit.   No current outpatient prescriptions on file.   Facility-Administered Medications Ordered in Other Visits  Medication Dose Route Frequency Provider Last Rate Last Dose  . 0.9 %  sodium chloride infusion   Intravenous Continuous Nita Sells, MD 75 mL/hr at 04/27/15 2150    . acetaminophen (TYLENOL) tablet 650 mg  650 mg Oral Q6H PRN Nita Sells, MD      . acyclovir (ZOVIRAX) tablet 400 mg  400 mg Oral BID Nita Sells, MD   400 mg at 04/27/15 2132  . [START ON 04/28/2015] aspirin EC tablet 325 mg  325 mg Oral Daily Nita Sells, MD      . atorvastatin (LIPITOR) tablet 40 mg  40 mg Oral q1800 Herminio Commons, MD   40 mg at 04/27/15 1646  . bisacodyl (DULCOLAX) EC tablet 10 mg  10 mg Oral Daily PRN Nita Sells, MD      . cyclobenzaprine (FLEXERIL) tablet 5 mg  5 mg Oral TID Nita Sells, MD   5 mg at 04/27/15 2131  . [START ON 05/03/2015] dexamethasone (DECADRON) tablet 40 mg  40 mg Oral Weekly Nita Sells, MD      . famotidine (PEPCID) tablet 20 mg  20 mg Oral BID Nita Sells, MD   20 mg at 04/27/15 2132  . feeding supplement (ENSURE ENLIVE) (ENSURE ENLIVE) liquid 237 mL  237 mL Oral BID BM Nita Sells, MD   237 mL at 04/27/15 0947  . heparin ADULT infusion 100 units/mL (25000 units/250 mL)  1,100 Units/hr Intravenous Continuous Minda Ditto, RPH 11 mL/hr at 04/27/15 2148 1,100 Units/hr at 04/27/15 2148  . metoprolol tartrate  (LOPRESSOR) tablet 12.5 mg  12.5 mg Oral BID Herminio Commons, MD   12.5 mg at 04/27/15 2132  . oxyCODONE (Oxy IR/ROXICODONE) immediate release tablet 10 mg  10 mg Oral Q6H PRN Nita Sells, MD      . OxyCODONE (OXYCONTIN) 12 hr tablet 15 mg  15 mg Oral Q12H Nita Sells, MD   15 mg at 04/27/15 2132  . potassium chloride 20 MEQ/15ML (10%) solution 40 mEq  40 mEq Oral TID Nita Sells, MD   40 mEq at 04/27/15 2131  . senna-docusate (Senokot-S) tablet 2 tablet  2 tablet Oral BID Nita Sells, MD   2 tablet at 04/27/15 0947  . sorbitol 70 % solution 30 mL  30  mL Oral Daily PRN Nita Sells, MD        REVIEW OF SYSTEMS:   Constitutional: Denies fevers, chills or abnormal night sweats Eyes: Denies blurriness of vision, double vision or watery eyes Ears, nose, mouth, throat, and face: Denies mucositis or sore throat Respiratory: Denies cough, dyspnea or wheezes Cardiovascular: Denies palpitation, chest discomfort or lower extremity swelling Gastrointestinal:  Denies nausea, heartburn or change in bowel habits Skin: Denies abnormal skin rashes Lymphatics: Denies new lymphadenopathy or easy bruising Neurological:Denies numbness, tingling or new weaknesses Behavioral/Psych: Mood is stable, no new changes  Musculoskeletal: Positive for back pain and intermittent rib pain All other systems were reviewed with the patient and are negative.  PHYSICAL EXAMINATION: ECOG PERFORMANCE STATUS: 1 - Symptomatic but completely ambulatory Blood pressure 130/55, heart rate 71, respiratory rate 18, temperature 97.8, saturation 100% on room air GENERAL: Today on the table, drowsy, arousable, no distress and comfortable SKIN: skin color, texture, turgor are normal, no rashes or significant lesions EYES: normal, conjunctiva are pink and non-injected, sclera clear OROPHARYNX:no exudate, no erythema and lips, buccal mucosa, and tongue normal  NECK: supple, thyroid normal size,  non-tender, without nodularity LYMPH:  no palpable lymphadenopathy in the cervical, axillary or inguinal LUNGS: clear to auscultation and percussion with normal breathing effort HEART: regular rate & rhythm and no murmurs and no lower extremity edema ABDOMEN:abdomen soft, non-tender and normal bowel sounds Musculoskeletal:no cyanosis of digits and no clubbing  PSYCH: alert & oriented x 3 with fluent speech NEURO: no focal motor/sensory deficits  LABORATORY DATA:   CBC Latest Ref Rng 04/27/2015 04/26/2015 04/22/2015  WBC 4.0 - 10.5 K/uL 13.6(H) 13.0(H) 9.2  Hemoglobin 13.0 - 17.0 g/dL 15.7 16.5 14.1  Hematocrit 39.0 - 52.0 % 44.7 45.9 42.0  Platelets 150 - 400 K/uL 244 268 219    CMP Latest Ref Rng 04/27/2015 04/26/2015 04/23/2015  Glucose 70 - 99 mg/dL 89 169(H) 123(H)  BUN 6 - 23 mg/dL '15 15 10  ' Creatinine 0.50 - 1.35 mg/dL 1.44(H) 1.58(H) 1.70(H)  Sodium 135 - 145 mmol/L 130(L) 124(L) 132(L)  Potassium 3.5 - 5.1 mmol/L 3.3(L) 3.4(L) 3.1(L)  Chloride 96 - 112 mmol/L 99 96 104  CO2 19 - 32 mmol/L '21 20 23  ' Calcium 8.4 - 10.5 mg/dL 10.1 9.6 9.4  Total Protein 6.0 - 8.3 g/dL 7.4 8.1 -  Total Bilirubin 0.3 - 1.2 mg/dL 0.7 1.0 -  Alkaline Phos 39 - 117 U/L 203(H) 226(H) -  AST 0 - 37 U/L 48(H) 59(H) -  ALT 0 - 53 U/L 80(H) 94(H) -   Kappa/lambda light chains  Status: Finalresult Visible to patient:  MyChart Nextappt: 04/04/2015 at 10:00 AM in Oncology ([facility] CHEMO EDU) Dx:  MGUS (monoclonal gammopathy of unknow...            Ref Range 2wk ago    Kappa free light chain 0.33 - 1.94 mg/dL 690.00 (H)   Lambda Free Lght Chn 0.57 - 2.63 mg/dL 0.13 (L)   Kappa:Lambda Ratio 0.26 - 1.65  *          PATHOLOGY REPORT Bone Marrow, Aspirate,Biopsy, and Clot, right iliac 03/27/2015 - HYPERCELLULAR BONE MARROW FOR AGE WITH PLASMA CELL NEOPLASM. - SEE COMMENT. PERIPHERAL BLOOD: - NO SIGNIFICANT MORPHOLOGIC ABNORMALITIES. Diagnosis Note The bone marrow shows  increased number of atypical plasma cells representing 47% of all cells in the aspirate associated with prominent interstitial infiltrates and variably sized aggregates in the clot and biopsy sections. Immunohistochemical stains show that the  plasma cells are kappa light chain restricted consistent with plasma cell neoplasm. The background shows trilineage  Cytogenetics  RADIOGRAPHIC STUDIES: Bone survey 03/19/2015 IMPRESSION: No discrete lytic lesion, but there is osteopenia and calvarial heterogeneity -nonspecific findings which can be manifestations of Myeloma  ASSESSMENT & PLAN:  64 year old Caucasian male with past medical history of hypertension, tonsil cancer status post surgery and radiation 10 years ago, now presented with 3-4 months low back pain and intermittent rib pain. His blood test showed M protein 1.7g/dl.  1. IgG multiple myeloma, stage I, standard risk by cytogenetics -I discussed his bone marrow biopsy results with him. He has significant amount of plasma cells in the bone marrow 47%.  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 Based on the new notable myeloma diagnostic criteria, he meets the criteria of multiple myeloma. He has normal abdomen and a mildly elevated Michael clubbing, this is stage I. -His cytogenetics and Fish panel revealed standard risk. -His thoracic and lumbar spine MRI showed pathologic compression fracture at T12, focal myeloma deposits at L3 and T9, diffuse marrow signal abnormality consistent with myeloma -I recommend induction chemotherapy with RVD regimen, weekly Velcade and dexamethasone, the Revlimid dose will be 10 mg daily 3 week on, one-week off, based on his renal function. -The goal of  treatment is disease control, potential cure with bone marrow transplant -He is a candidate for bone marrow transplant. I'll refer him to [facility] for bone marrow transplant evaluation -Continue week 3 treatment, his Revlimid was held for a few days weekly he was in the hospital last week, we'll continue and finish the first cycle early next week    2. T12 pathologic compression fracture, and diffuse myeloma involvement in bones -Status post kyphoplasty -He was started palliative Radiation for pain control next week -We'll likely give him monthly pamidronate   3. Back and rib pain, secondary to multiple myeloma bone lesions  -He needs better pain control with meds  -hospital admission today   Plan -Giving his episodes of syncope and hypotension, severe uncontrolled pain, I'll send him to [facility] emergency room and likely he needs to be admitted to hospital. -Velcade injection today. Continue Revlimid continue he starts radiation. -I spoke with radiation oncologist Dr. Sondra Come today, he will likely start radiation early next week.  All questions were answered. The patient knows to call the clinic with any problems, questions or concerns. I spent 30 minutes counseling the patient face to face. The total time spent in the appointment was 40 minutes and more than 50% was on counseling.     Truitt Merle, MD 04/26/2015 11:36 PM

## 2015-04-27 NOTE — Progress Notes (Signed)
Echocardiogram 2D Echocardiogram has been performed.  Eric Dudley 04/27/2015, 11:22 AM

## 2015-04-27 NOTE — Progress Notes (Signed)
Notified NP overnight of abnormal EKG and critical troponin's.  Started Heparin per orders.  Patient had had no complaints of chest pain or shortness of breath.  Will continue to monitor patient.

## 2015-04-27 NOTE — Progress Notes (Signed)
[place] for IV Heparin Indication: Elevated troponin  Allergies  Allergen Reactions  . Hydrochlorothiazide Rash and Palpitations  . Asa [Aspirin] Nausea Only  . Hydrocodone Itching and Other (See Comments)    Jittery, hyperactivity   . Amoxicillin Itching and Rash  . Indapamide Palpitations    Lightheadedness, dizziness   Patient Measurements: Height: 5\' 8"  (172.7 cm) Weight: 192 lb 11.2 oz (87.408 kg) IBW/kg (Calculated) : 68.4 Heparin Dosing Weight: 74 kg  Vital Signs: Temp: 97.6 F (36.4 C) (04/30 1230) Temp Source: Oral (04/30 1230) BP: 105/59 mmHg (04/30 1230) Pulse Rate: 63 (04/30 1230)  Labs:  Recent Labs  04/26/15 1932 04/26/15 2330 04/27/15 0138 04/27/15 0406 04/27/15 0750 04/27/15 1159 04/27/15 1610  HGB 16.5  --   --  15.7  --   --   --   HCT 45.9  --   --  44.7  --   --   --   PLT 268  --   --  244  --   --   --   APTT  --   --  28  --   --   --   --   LABPROT  --   --  13.9 13.8  --   --   --   INR  --   --  1.06 1.05  --   --   --   HEPARINUNFRC  --   --   --   --  0.22*  --  0.57  CREATININE 1.58*  --   --  1.44*  --   --   --   TROPONINI  --  3.85*  --  3.27*  --  2.96*  --    Estimated Creatinine Clearance: 55 mL/min (by C-G formula based on Cr of 1.44).  Medical History: Past Medical History  Diagnosis Date  . Hypertension   . Dyslipidemia   . Tonsillar cancer 2006  . Cholelithiases    Scheduled:  . acyclovir  400 mg Oral BID  . [START ON 04/28/2015] aspirin EC  325 mg Oral Daily  . atorvastatin  40 mg Oral q1800  . cyclobenzaprine  5 mg Oral TID  . [START ON 05/03/2015] dexamethasone  40 mg Oral Weekly  . famotidine  20 mg Oral BID  . feeding supplement (ENSURE ENLIVE)  237 mL Oral BID BM  . metoprolol tartrate  12.5 mg Oral BID  . OxyCODONE  15 mg Oral Q12H  . potassium chloride  40 mEq Oral TID  . senna-docusate  2 tablet Oral BID   Infusions:  . sodium chloride 75 mL/hr at 04/26/15 2039   . heparin 1,100 Units/hr (04/27/15 0950)   Assessment: 89 yoM admitted with syncope.  Pt with hx of multiple myeloma now with Trop=3.85.  IV Heparin per Rx for elevated troponin. EKG shows T wave abnormalities, STEMI.  Heparin 2000 unit bolus x1 (0200)  Started drip @ 900 units/hr  Today 04/27/2015  First Heparin level 0.22, sub therapeutic - 1000 unit bolus then increase rate to 1100 units/hr  4pm HL therapeutic at rate of 1000 units/hr  No reported bleeding or problems with heparin per RN  Goal of Therapy:  Heparin level 0.3-0.7 units/ml Monitor platelets by anticoagulation protocol: Yes   Plan:   Continue Heparin at 1100 units/hr  Daily CBC/HL  Recheck heparin level at 2200 for confirmatory level at current rate   Adrian Saran, PharmD, BCPS Pager [phone] 04/27/2015 4:46  PM

## 2015-04-27 NOTE — Progress Notes (Signed)
CRITICAL VALUE ALERT  Critical value received:  Trop 3.85  Date of notification:  04/27/15  Time of notification:  0105  Critical value read back:Yes.    Nurse who received alert:  Ruben Im RN  MD notified (1st page):  Jonette Eva  Time of first page:  0105  MD notified (2nd page):  Time of second page:  Responding MD:  Jonette Eva  Time MD responded:  [phone]

## 2015-04-27 NOTE — Progress Notes (Signed)
[place] for IV Heparin Indication: Elevated troponin  Allergies  Allergen Reactions  . Hydrochlorothiazide Rash and Palpitations  . Asa [Aspirin] Nausea Only  . Hydrocodone Itching and Other (See Comments)    Jittery, hyperactivity   . Amoxicillin Itching and Rash  . Indapamide Palpitations    Lightheadedness, dizziness   Patient Measurements: Height: _0  (172.7 cm) Weight: 192 lb 11.2 oz (87.408 kg) IBW/kg (Calculated) : 68.4 Heparin Dosing Weight: 74 kg  Vital Signs: Temp: 98.3 F (36.8 C) (04/30 0532) Temp Source: Oral (04/30 0532) BP: 102/58 mmHg (04/30 0532) Pulse Rate: 81 (04/30 0532)  Labs:  Recent Labs  04/26/15 1932 04/26/15 2330 04/27/15 0138 04/27/15 0406 04/27/15 0750  HGB 16.5  --   --  15.7  --   HCT 45.9  --   --  44.7  --   PLT 268  --   --  244  --   APTT  --   --  28  --   --   LABPROT  --   --  13.9 13.8  --   INR  --   --  1.06 1.05  --   HEPARINUNFRC  --   --   --   --  0.22*  CREATININE 1.58*  --   --  1.44*  --   TROPONINI  --  3.85*  --  3.27*  --    Estimated Creatinine Clearance: 55 mL/min (by C-G formula based on Cr of 1.44).  Medical History: Past Medical History  Diagnosis Date  . Hypertension   . Dyslipidemia   . Tonsillar cancer 2006  . Cholelithiases    Scheduled:  . acyclovir  400 mg Oral BID  . [START ON 04/28/2015] aspirin EC  325 mg Oral Daily  . cyclobenzaprine  5 mg Oral TID  . [START ON 05/03/2015] dexamethasone  40 mg Oral Weekly  . famotidine  20 mg Oral BID  . feeding supplement (ENSURE ENLIVE)  237 mL Oral BID BM  . OxyCODONE  15 mg Oral Q12H  . potassium chloride  40 mEq Oral TID  . senna-docusate  2 tablet Oral BID   Infusions:  . sodium chloride 75 mL/hr at 04/26/15 2039  . heparin 900 Units/hr (04/27/15 0152)   Assessment: 22 yoM admitted with syncope.  Pt with hx of multiple myeloma now with Trop=3.85.  IV Heparin per Rx for elevated troponin. EKG shows T wave  abnormalities, STEMI.  Heparin 2000 unit bolus x1 (0200)  Started drip @ 900 units/hr  Today 04/27/2015  First Heparin level 0.22, sub therapeutic  Pain score 5-6 (chest pain) intermittent  Goal of Therapy:  Heparin level 0.3-0.7 units/ml Monitor platelets by anticoagulation protocol: Yes   Plan:   Repeat Heparin bolus of 1000 units  Increase Heparin rate to 1100 units/hr  Daily CBC/HL  Check  HL 1600  Minda Ditto PharmD Pager [phone] 04/27/2015, 9:12 AM

## 2015-04-27 NOTE — Progress Notes (Signed)
Eric Dudley TWS:[id number] DOB: [birth year] DOA: 04/26/2015 PCP: [name], MD  Brief narrative:  64 y/o recently admitted by this physician 4/19 for acute back pain where he was found to have a pathological T12 fracture IR consulted and recommended kyphoplasty which was performed on 04/19/15.  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 c LBP + Osteopenia + Pain, Neg Stress test 10/16/14 [[facility]]  Patient came to his regular scheduled injection of Velcade at the cancer center 4/29 and was being mapped for XRT and while being transferred from chair to table he was dropped down below usual 30  He developed episodic syncope and he came back to normal within 20 seconds-he has never had an issue like this before  On admission while working up for syncope noted EKG changes and elevated troponin  Past medical history-As per Problem list Chart reviewed as below-   Consultants:  Cardiology  Procedures:  none  Antibiotics:  nad   Subjective   Feels fair Tells me has had "occasioanl" chest pain in the L side of the chest since kyphoplaty Has had no n/v nor radiating pain States there is a point tenderness under his L breast No diaphoresis    Objective    Interim History:   Telemetry: nsr   Objective: Filed Vitals:   04/26/15 2057 04/26/15 2247 04/26/15 2252 04/27/15 0532  BP: 111/66   102/58  Pulse: 102   81  Temp: 98.1 F (36.7 C)   98.3 F (36.8 C)  TempSrc: Oral   Oral  Resp: 19   18  Height:  '5\' 8"'  (1.727 m)    Weight:   87.408 kg (192 lb 11.2 oz)   SpO2: 96%   95%    Intake/Output Summary (Last 24 hours) at 04/27/15 1000 Last data filed at 04/27/15 0700  Gross per 24 hour  Intake 1062.45 ml  Output   1350 ml  Net -287.55 ml    Exam:  General: eomi, ncat Cardiovascular:  s1s 2 no m/r/g Respiratory: clear no added sound Abdomen:  Soft, NT, ND Skin no le edema Neuro  grossly intact  Data Reviewed: Basic Metabolic Panel:  Recent Labs Lab 04/21/15 0452  04/22/15 1630 04/23/15 0418 04/23/15 0841 04/26/15 1932 04/27/15 0406  NA 137  < > 138 136 132* 124* 130*  K 2.6*  < > 2.6* 3.4* 3.1* 3.4* 3.3*  CL 106  < > 105 106 104 96 99  CO2 25  < > '27 24 23 20 21  ' GLUCOSE 109*  < > 103* 111* 123* 169* 89  BUN 20  < > '11 11 10 15 15  ' CREATININE 1.63*  < > 1.52* 1.57* 1.70* 1.58* 1.44*  CALCIUM 9.3  < > 9.3 9.4 9.4 9.6 10.1  MG 2.0  --   --   --   --   --   --   < > = values in this interval not displayed. Liver Function Tests:  Recent Labs Lab 04/26/15 1932 04/27/15 0406  AST 59* 48*  ALT 94* 80*  ALKPHOS 226* 203*  BILITOT 1.0 0.7  PROT 8.1 7.4  ALBUMIN 4.1 3.8   No results for input(s): LIPASE, AMYLASE in the last 168 hours. No results for input(s): AMMONIA in the last 168 hours. CBC:  Recent Labs Lab 04/22/15 0408 04/26/15 1932 04/27/15 0406  WBC 9.2 13.0* 13.6*  HGB 14.1 16.5 15.7  HCT 42.0 45.9  44.7  MCV 98.6 95.2 95.1  PLT 219 268 244   Cardiac Enzymes:  Recent Labs Lab 04/26/15 2330 04/27/15 0406  TROPONINI 3.85* 3.27*   BNP: Invalid input(s): POCBNP CBG: No results for input(s): GLUCAP in the last 168 hours.  Recent Results (from the past 240 hour(s))  Surgical pcr screen     Status: None   Collection Time: 04/19/15 12:02 PM  Result Value Ref Range Status   MRSA, PCR NEGATIVE NEGATIVE Final   Staphylococcus aureus NEGATIVE NEGATIVE Final    Comment:        The Xpert SA Assay (FDA approved for NASAL specimens in patients over 15 years of age), is one component of a comprehensive surveillance program.  Test performance has been validated by [facility] for patients greater than or equal to 63 year old. It is not intended to diagnose infection nor to guide or monitor treatment.      Studies:              All Imaging reviewed and is as per above notation   Scheduled Meds: . acyclovir  400 mg Oral BID    . [START ON 04/28/2015] aspirin EC  325 mg Oral Daily  . cyclobenzaprine  5 mg Oral TID  . [START ON 05/03/2015] dexamethasone  40 mg Oral Weekly  . famotidine  20 mg Oral BID  . feeding supplement (ENSURE ENLIVE)  237 mL Oral BID BM  . OxyCODONE  15 mg Oral Q12H  . potassium chloride  40 mEq Oral TID  . senna-docusate  2 tablet Oral BID   Continuous Infusions: . sodium chloride 75 mL/hr at 04/26/15 2039  . heparin 1,100 Units/hr (04/27/15 0950)     Assessment/Plan:  1. ACS, STEMI?-patient placed on Heparin Gtt-I have given ASA 325 chewable.  Troponin trend seems to be stable-We will continue to cycle. He is now heparinized and will need this for ~ 48 hours or as per Cardiology. His Creat ~ 1.4-so will defer to cardiology possible Cath.  Hypotensive ?may not be able to beta block.  Greatly appreciate Dr. Bobby Rumpf input 2. Multiple myeloma with complication of lytic lesion T11-status post kyphoplasty 4/22-management as per radiation oncology. Continue suppressive acyclovir 400 twice a day, continue Decadron 40 mg with Velcade. Velcade will be held for right now [just received a dose at [place] 4.29]. We will continue Revlimid as per oncologist instruction and I have held it for now Patient to have XRT at some point 3. HTN (hypertension)-history of--he was discontinued off of his ARB on discharge 4/26 secondary to chronic kidney disease 4. Hyperlipidemia-I have held the patient's Vytorin as well as other nonessential vitamins for now 5. Tonsillar cancer-history of 6.  CKD (chronic kidney disease), stage II--see above discussion 7. 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 Patient to continue potassium based on further lab testing 8. Hyponatremia-? 2/2 to cancer state, Potomania-will needs to continue IV saline 75 cc/hr   Code Status: Full Family Communication:  Long discussion with wife Disposition Plan: tele   [name],  MD  [facility] Pager [phone] 04/27/2015, 10:00 AM    LOS: 1 day

## 2015-04-28 ENCOUNTER — Inpatient Hospital Stay (HOSPITAL_COMMUNITY): Payer: Medicare Other

## 2015-04-28 LAB — BASIC METABOLIC PANEL
Anion gap: 5 (ref 5–15)
BUN: 18 mg/dL (ref 6–20)
CALCIUM: 9.3 mg/dL (ref 8.9–10.3)
CHLORIDE: 105 mmol/L (ref 101–111)
CO2: 22 mmol/L (ref 22–32)
CREATININE: 1.79 mg/dL — AB (ref 0.61–1.24)
GFR, EST AFRICAN AMERICAN: 44 mL/min — AB (ref >60)
GFR, EST NON AFRICAN AMERICAN: 38 mL/min — AB (ref >60)
Glucose, Bld: 110 mg/dL — ABNORMAL HIGH (ref 70–99)
Potassium: 3.7 mmol/L (ref 3.5–5.1)
Sodium: 132 mmol/L — ABNORMAL LOW (ref 135–145)

## 2015-04-28 LAB — CBC WITH DIFFERENTIAL/PLATELET
Basophils Absolute: 0 10*3/uL (ref 0.0–0.1)
Basophils Relative: 0 % (ref 0–1)
Eosinophils Absolute: 0.2 10*3/uL (ref 0.0–0.7)
Eosinophils Relative: 2 % (ref 0–5)
HCT: 39.3 % (ref 39.0–52.0)
Hemoglobin: 13.4 g/dL (ref 13.0–17.0)
Lymphocytes Relative: 9 % — ABNORMAL LOW (ref 12–46)
Lymphs Abs: 0.9 10*3/uL (ref 0.7–4.0)
MCH: 33.3 pg (ref 26.0–34.0)
MCHC: 34.1 g/dL (ref 30.0–36.0)
MCV: 97.5 fL (ref 78.0–100.0)
Monocytes Absolute: 1.4 10*3/uL — ABNORMAL HIGH (ref 0.1–1.0)
Monocytes Relative: 14 % — ABNORMAL HIGH (ref 3–12)
Neutro Abs: 7.3 10*3/uL (ref 1.7–7.7)
Neutrophils Relative %: 75 % (ref 43–77)
Platelets: 205 10*3/uL (ref 150–400)
RBC: 4.03 MIL/uL — ABNORMAL LOW (ref 4.22–5.81)
RDW: 12.8 % (ref 11.5–15.5)
WBC: 9.8 10*3/uL (ref 4.0–10.5)

## 2015-04-28 LAB — HEPARIN LEVEL (UNFRACTIONATED)
Heparin Unfractionated: 0.28 IU/mL — ABNORMAL LOW (ref 0.30–0.70)
Heparin Unfractionated: 0.46 IU/mL (ref 0.30–0.70)

## 2015-04-28 MED ORDER — CLOPIDOGREL BISULFATE 75 MG PO TABS
75.0000 mg | ORAL_TABLET | Freq: Every day | ORAL | Status: DC
  Administered 2015-04-28 – 2015-05-01 (×4): 75 mg via ORAL
  Filled 2015-04-28 (×5): qty 1

## 2015-04-28 MED ORDER — METOPROLOL SUCCINATE ER 25 MG PO TB24
12.5000 mg | ORAL_TABLET | Freq: Every day | ORAL | Status: DC
Start: 2015-04-28 — End: 2015-05-01
  Administered 2015-04-28 – 2015-05-01 (×2): 12.5 mg via ORAL
  Filled 2015-04-28 (×3): qty 1

## 2015-04-28 NOTE — Progress Notes (Signed)
All 4 siderails up per patient request. States he doesn't want room phone or call bell sliding off side of bed. Reminded pt to call for assistance to get out of bed. Wife at bedside. Lind Guest, RN

## 2015-04-28 NOTE — Progress Notes (Signed)
Initial Nutrition Assessment  DOCUMENTATION CODES:  Not applicable  INTERVENTION:  Ensure Enlive (each supplement provides 350kcal and 20 grams of protein) (BID)  -Encourage PO intake  NUTRITION DIAGNOSIS:  Increased nutrient needs related to cancer and cancer related treatments as evidenced by estimated needs.   GOAL:  Patient will meet greater than or equal to 90% of their needs   MONITOR:  PO intake, Supplement acceptance, Labs, Weight trends, I & O's  REASON FOR ASSESSMENT:  Malnutrition Screening Tool    ASSESSMENT: 64 y/o recently admitted by this physician 4/19 for acute back pain where he was found to have a pathological T12 fracture IR consulted and recommended kyphoplasty which was performed on 04/19/15. 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  Pt in room with family at bedside. Pt reports eating ~90% of his omelette, grapes, sausage patty for breakfast. Pt c/o food tasting bland. Pt has lost interest in food but eats because he knows he has to. Pt has been drinking Ensure supplements the past couple of months. Pt does not like the taste but prefers Ensure over other nutritional supplements. Pt states he will continue to drink the supplements.   Pt has lost 10 lb over the last 10 days per weight history documentation (5% weight loss x 10 days, significant for time frame). Nutrition focused physical exam shows no sign of depletion of muscle mass or body fat.  Labs reviewed: Elevated Creatinine Low Na  Height:  Ht Readings from Last 1 Encounters:  04/26/15 5\' 8"  (1.727 m)    Weight:  Wt Readings from Last 1 Encounters:  04/26/15 192 lb 11.2 oz (87.408 kg)    Ideal Body Weight:  70 kg  Wt Readings from Last 10 Encounters:  04/26/15 192 lb 11.2 oz (87.408 kg)  04/16/15 202 lb (91.627 kg)  04/16/15 202 lb (91.627 kg)  04/02/15 207 lb (93.895 kg)  03/15/15 206 lb 11.2 oz (93.759 kg)  07/06/13 217 lb 6.4 oz  (98.612 kg)    BMI:  Body mass index is 29.31 kg/(m^2).  Estimated Nutritional Needs:  Kcal:  2200-2400  Protein:  105-115g  Fluid:  2.2L/day    Skin:  Reviewed, no issues  Diet Order:  Diet regular Room service appropriate?: Yes; Fluid consistency:: Thin  EDUCATION NEEDS:  No education needs identified at this time   Intake/Output Summary (Last 24 hours) at 04/28/15 1223 Last data filed at 04/28/15 0707  Gross per 24 hour  Intake 1153.03 ml  Output    825 ml  Net 328.03 ml    Last BM:  4/30  Clayton Bibles, MS, RD, LDN Pager: [phone] After Hours Pager: [phone]

## 2015-04-28 NOTE — Clinical Social Work Note (Signed)
Clinical Social Work Assessment  Patient Details  Name: DEMETRIC DUNNAWAY MRN: [id number] Date of Birth: 12/11/1951  Date of referral:  04/28/15               Reason for consult:  Facility Placement                Permission sought to share information with:  Facility Art therapist granted to share information::  Yes, Verbal Permission Granted  Name::        Agency::     Relationship::     Contact Information:     Housing/Transportation Living arrangements for the past 2 months:  [place], [employer] of Information:  Patient, Spouse Patient Interpreter Needed:  None Criminal Activity/Legal Involvement Pertinent to Current Situation/Hospitalization:    Significant Relationships:  Spouse Lives with:  Spouse Do you feel safe going back to the place where you live?  Yes Need for family participation in patient care:  Yes (Comment)  Care giving concerns:  CSW received consult that patient was admitted from [facility].    Social Worker assessment / plan:  CSW met with patient & wife, Nelie at bedside re: discharge plan. Wife informed CSW that she received a call from [facility] questioning whether they want to pay to hold his bed there or not. CSW reassured wife that every facility asks that question and that would hold his bed at the facility - if not, they would run the risk of them filling the bed and he would have to look elsewhere for SNF.   Employment status:  Retired Forensic scientist:  Medicare PT Recommendations:  Not assessed at this time Information / Referral to community resources:  [place]  Patient/Family's Response to care:  Patient's wife states that she would not be able to afford to pay $200-300/day to hold his bed. CSW spoke with Dr. Verlon Au who states that he will likely be ready for discharge tomorrow or Tuesday. CSW left message for Nira Conn at [facility] to make her aware.    Patient/Family's Understanding of and Emotional Response to Diagnosis, Current Treatment, and Prognosis:  Patient seemed frustrated for having to be readmitted to the hospital after just discharging to [facility] on 04/23/15.   Emotional Assessment Appearance:  Appears stated age Attitude/Demeanor/Rapport:    Affect (typically observed):  Calm, Pleasant, Accepting, Adaptable Orientation:  Oriented to Self, Oriented to Place, Oriented to  Time, Oriented to Situation Alcohol / Substance use:    Psych involvement (Current and /or in the community):  No (Comment)  Discharge Needs  Concerns to be addressed:  Discharge Planning Concerns Readmission within the last 30 days:  Yes Current discharge risk:    Barriers to Discharge:      Standley Brooking, LCSW 04/28/2015, 2:48 PM

## 2015-04-28 NOTE — Progress Notes (Signed)
[place] for IV Heparin Indication: Elevated troponin  Allergies  Allergen Reactions  . Hydrochlorothiazide Rash and Palpitations  . Asa [Aspirin] Nausea Only  . Hydrocodone Itching and Other (See Comments)    Jittery, hyperactivity   . Amoxicillin Itching and Rash  . Indapamide Palpitations    Lightheadedness, dizziness   Patient Measurements: Height: '5\' 8"'  (172.7 cm) Weight: 192 lb 11.2 oz (87.408 kg) IBW/kg (Calculated) : 68.4 Heparin Dosing Weight: 74 kg  Vital Signs: Temp: 97.6 F (36.4 C) (05/01 0514) Temp Source: Oral (05/01 0514) BP: 90/49 mmHg (05/01 0514) Pulse Rate: 56 (05/01 0514)  Labs:  Recent Labs  04/26/15 1932  04/27/15 0138 04/27/15 0406  04/27/15 1159 04/27/15 1610 04/27/15 2204 04/28/15 0514  HGB 16.5  --   --  15.7  --   --   --   --   --   HCT 45.9  --   --  44.7  --   --   --   --   --   PLT 268  --   --  244  --   --   --   --   --   APTT  --   --  28  --   --   --   --   --   --   LABPROT  --   --  13.9 13.8  --   --   --   --   --   INR  --   --  1.06 1.05  --   --   --   --   --   HEPARINUNFRC  --   --   --   --   < >  --  0.57 0.56 0.28*  CREATININE 1.58*  --   --  1.44*  --   --   --   --  1.79*  TROPONINI  --   < >  --  3.27*  --  2.96* 2.51* 2.35*  --   < > = values in this interval not displayed. Estimated Creatinine Clearance: 44.2 mL/min (by C-G formula based on Cr of 1.79).  Medical History: Past Medical History  Diagnosis Date  . Hypertension   . Dyslipidemia   . Tonsillar cancer 2006  . Cholelithiases    Scheduled:  . acyclovir  400 mg Oral BID  . aspirin EC  325 mg Oral Daily  . atorvastatin  40 mg Oral q1800  . cyclobenzaprine  5 mg Oral TID  . [START ON 05/03/2015] dexamethasone  40 mg Oral Weekly  . famotidine  20 mg Oral BID  . feeding supplement (ENSURE ENLIVE)  237 mL Oral BID BM  . metoprolol tartrate  12.5 mg Oral BID  . OxyCODONE  15 mg Oral Q12H  . potassium  chloride  40 mEq Oral TID  . senna-docusate  2 tablet Oral BID   Infusions:  . sodium chloride 75 mL/hr at 04/28/15 0345  . heparin 1,100 Units/hr (04/28/15 0345)   Assessment: 29 yoM admitted with syncope.  Pt with hx of multiple myeloma now with Trop=3.85.  IV Heparin per Rx for elevated troponin. EKG shows T wave abnormalities, STEMI.  Heparin 2000 unit bolus x1 (0200)  Started drip @ 900 units/hr 04/27/15  First Heparin level 0.22, sub therapeutic - 1000 unit bolus then increase rate to 1100 units/hr  2204 HL=0.56 (x2 therapeutic)  No reported bleeding or problems with heparin reported from RN Today,  04/28/15  0514 HL=0.28, spoke with RN drip was stopped form 0215 to 0345. Will not change rate and recheck level @ 10am  Goal of Therapy:  Heparin level 0.3-0.7 units/ml Monitor platelets by anticoagulation protocol: Yes   Plan:   Continue Heparin at 1100 units/hr  Recheck HL @ 10 am  Daily CBC/HL    Dorrene German 04/28/2015 6:34 AM

## 2015-04-28 NOTE — Progress Notes (Addendum)
Eric Dudley:[id number] DOB: Aug 10, 1951 DOA: 04/26/2015 PCP: [name], MD  Brief narrative:  64 y/o recently admitted by this physician 4/19 for acute back pain where he was found to have a pathological T12 fracture IR consulted and recommended kyphoplasty which was performed on 04/19/15.  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 c LBP + Osteopenia + Pain, Neg Stress test 10/16/14 [[facility]]  Patient came to his regular scheduled injection of Velcade at the cancer center 4/29 and was being mapped for XRT and while being transferred from chair to table he was dropped down below usual 30  He developed episodic syncope and he came back to normal within 20 seconds-he has never had an issue like this before  On admission while working up for syncope noted EKG changes and elevated troponin  Past medical history-As per Problem list Chart reviewed as below-   Consultants:  Cardiology  Procedures:  none  Antibiotics:  nad   Subjective   Feels fair Confused and hallucinating overnight "i usually only take tyelnol for pain!" Pain reasonable in back  no further CP  no radiating arm pain    Objective    Interim History:   Telemetry: nsr   Objective: Filed Vitals:   04/28/15 0100 04/28/15 0229 04/28/15 0514 04/28/15 0630  BP: 103/50 100/54 90/49 101/43  Pulse: 50 56 56 58  Temp:   97.6 F (36.4 C)   TempSrc:   Oral   Resp: 17  18   Height:      Weight:      SpO2: 100%  100%     Intake/Output Summary (Last 24 hours) at 04/28/15 0942 Last data filed at 04/28/15 0707  Gross per 24 hour  Intake 1153.03 ml  Output    825 ml  Net 328.03 ml    Exam:  General: eomi, ncat Cardiovascular:  s1s 2 no m/r/g Respiratory: clear no added sound Abdomen:  Soft, NT, ND Skin no le edema   Data Reviewed: Basic Metabolic Panel:  Recent Labs Lab 04/23/15 0418 04/23/15 0841  04/26/15 1932 04/27/15 0406 04/28/15 0514  NA 136 132* 124* 130* 132*  K 3.4* 3.1* 3.4* 3.3* 3.7  CL 106 104 96 99 105  CO2 '24 23 20 21 22  ' GLUCOSE 111* 123* 169* 89 110*  BUN '11 10 15 15 18  ' CREATININE 1.57* 1.70* 1.58* 1.44* 1.79*  CALCIUM 9.4 9.4 9.6 10.1 9.3   Liver Function Tests:  Recent Labs Lab 04/26/15 1932 04/27/15 0406  AST 59* 48*  ALT 94* 80*  ALKPHOS 226* 203*  BILITOT 1.0 0.7  PROT 8.1 7.4  ALBUMIN 4.1 3.8   No results for input(s): LIPASE, AMYLASE in the last 168 hours. No results for input(s): AMMONIA in the last 168 hours. CBC:  Recent Labs Lab 04/22/15 0408 04/26/15 1932 04/27/15 0406  WBC 9.2 13.0* 13.6*  HGB 14.1 16.5 15.7  HCT 42.0 45.9 44.7  MCV 98.6 95.2 95.1  PLT 219 268 244   Cardiac Enzymes:  Recent Labs Lab 04/26/15 2330 04/27/15 0406 04/27/15 1159 04/27/15 1610 04/27/15 2204  TROPONINI 3.85* 3.27* 2.96* 2.51* 2.35*   BNP: Invalid input(s): POCBNP CBG: No results for input(s): GLUCAP in the last 168 hours.  Recent Results (from the past 240 hour(s))  Surgical pcr screen     Status: None   Collection Time: 04/19/15 12:02 PM  Result Value Ref Range Status   MRSA,  PCR NEGATIVE NEGATIVE Final   Staphylococcus aureus NEGATIVE NEGATIVE Final    Comment:        The Xpert SA Assay (FDA approved for NASAL specimens in patients over 20 years of age), is one component of a comprehensive surveillance program.  Test performance has been validated by [facility] for patients greater than or equal to 39 year old. It is not intended to diagnose infection nor to guide or monitor treatment.      Studies:              All Imaging reviewed and is as per above notation   Scheduled Meds: . acyclovir  400 mg Oral BID  . aspirin EC  325 mg Oral Daily  . atorvastatin  40 mg Oral q1800  . cyclobenzaprine  5 mg Oral TID  . [START ON 05/03/2015] dexamethasone  40 mg Oral Weekly  . famotidine  20 mg Oral BID  . feeding supplement  (ENSURE ENLIVE)  237 mL Oral BID BM  . metoprolol succinate  12.5 mg Oral Daily  . OxyCODONE  15 mg Oral Q12H  . potassium chloride  40 mEq Oral TID  . senna-docusate  2 tablet Oral BID   Continuous Infusions: . sodium chloride 75 mL/hr at 04/28/15 0345  . heparin 1,100 Units/hr (04/28/15 0345)     Assessment/Plan:  1. ACS, STEMI?-patient placed on Heparin Gtt-I have given ASA 325 chewable.  Troponin trend seems to be stable-We will continue to cycle. He is now heparinized until 17:00 04/28/15-then start Plavix 75 daily. His Creat ~ 1.4-Per Cardiologist for OP Myo stress.  Hypotensive ?may not be able to beta block.  Greatly appreciate Dr. Bobby Rumpf input 2. Metabolic encephalopathy 2/2 to opiates-He was re-started on OXY, but apparently only has taken this with Kidney pain-his confusion was transient overnight 4/30 and opiates d/c'd 3. Multiple myeloma with complication of lytic lesion T11-status post kyphoplasty 4/22-management as per radiation oncology. Continue suppressive acyclovir 400 twice a day, continue Decadron 40 mg with Velcade.  Velcade will be held for right now [just received a dose at [place] 4.29].  Revlimid-held for now. Patient to have XRT at some point?-Dr. Kinnard is aware. 4. HTN (hypertension)-history of--he was discontinued off of his ARB on discharge 4/26 secondary to chronic kidney disease 5. Hyperlipidemia-I have held the patient's Vytorin as well as other nonessential vitamins for now 6. Tonsillar cancer-history of 7.  CKD (chronic kidney disease), stage II--see above discussion 8. 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 Patient to continue potassium based on further lab testing 9. Hyponatremia-? 2/2 to cancer state, Potomania-was on IV saline 75 cc/hr--We will NSL lock 5/1   Code Status: Full Family Communication:  Long discussion with wife Disposition Plan: tele   [name], MD  [facility] Pager [phone] 04/28/2015, 9:42 AM    LOS: 2 days

## 2015-04-28 NOTE — Progress Notes (Signed)
SUBJECTIVE: Pt denies chest pain and shortness of breath. Had some fleeting "grabbing chest pains" lasting seconds yesterday, no different than what he has experienced for past 20 years. Denies palpitations.  Sitting up comfortably in bed eating breakfast with wife at bedside.     Intake/Output Summary (Last 24 hours) at 04/28/15 0911 Last data filed at 04/28/15 3612  Gross per 24 hour  Intake 1153.03 ml  Output    825 ml  Net 328.03 ml    Current Facility-Administered Medications  Medication Dose Route Frequency Provider Last Rate Last Dose  . 0.9 %  sodium chloride infusion   Intravenous Continuous Nita Sells, MD 75 mL/hr at 04/28/15 0345    . acetaminophen (TYLENOL) tablet 650 mg  650 mg Oral Q6H PRN Nita Sells, MD      . acyclovir (ZOVIRAX) tablet 400 mg  400 mg Oral BID Nita Sells, MD   400 mg at 04/27/15 2132  . aspirin EC tablet 325 mg  325 mg Oral Daily Nita Sells, MD      . atorvastatin (LIPITOR) tablet 40 mg  40 mg Oral q1800 Herminio Commons, MD   40 mg at 04/27/15 1646  . bisacodyl (DULCOLAX) EC tablet 10 mg  10 mg Oral Daily PRN Nita Sells, MD      . cyclobenzaprine (FLEXERIL) tablet 5 mg  5 mg Oral TID Nita Sells, MD   5 mg at 04/27/15 2131  . [START ON 05/03/2015] dexamethasone (DECADRON) tablet 40 mg  40 mg Oral Weekly Nita Sells, MD      . famotidine (PEPCID) tablet 20 mg  20 mg Oral BID Nita Sells, MD   20 mg at 04/27/15 2132  . feeding supplement (ENSURE ENLIVE) (ENSURE ENLIVE) liquid 237 mL  237 mL Oral BID BM Nita Sells, MD   237 mL at 04/27/15 0947  . heparin ADULT infusion 100 units/mL (25000 units/250 mL)  1,100 Units/hr Intravenous Continuous Minda Ditto, RPH 11 mL/hr at 04/28/15 0345 1,100 Units/hr at 04/28/15 0345  . metoprolol tartrate (LOPRESSOR) tablet 12.5 mg  12.5 mg Oral BID Herminio Commons, MD   12.5 mg at 04/27/15 2132  . oxyCODONE (Oxy IR/ROXICODONE)  immediate release tablet 10 mg  10 mg Oral Q6H PRN Nita Sells, MD   10 mg at 04/28/15 0630  . OxyCODONE (OXYCONTIN) 12 hr tablet 15 mg  15 mg Oral Q12H Nita Sells, MD   15 mg at 04/27/15 2132  . potassium chloride 20 MEQ/15ML (10%) solution 40 mEq  40 mEq Oral TID Nita Sells, MD   40 mEq at 04/27/15 2131  . senna-docusate (Senokot-S) tablet 2 tablet  2 tablet Oral BID Nita Sells, MD   2 tablet at 04/27/15 0947  . sorbitol 70 % solution 30 mL  30 mL Oral Daily PRN Nita Sells, MD        Filed Vitals:   04/28/15 0100 04/28/15 0229 04/28/15 0514 04/28/15 0630  BP: 103/50 100/54 90/49 101/43  Pulse: 50 56 56 58  Temp:   97.6 F (36.4 C)   TempSrc:   Oral   Resp: 17  18   Height:      Weight:      SpO2: 100%  100%     PHYSICAL EXAM General: NAD Neck: No JVD, no thyromegaly or thyroid nodule.  Lungs: Bibasilar crackles. CV: Nondisplaced PMI. Regular rate and rhythm, normal S1/S2, no A4/S9, 2/6 systolic murmur along left sternal border and 1/6 apical  holosystolic murmur. No peripheral edema. No carotid bruit. Normal pedal pulses.  Abdomen: Soft, nontender, no hepatosplenomegaly, no distention.  Skin: Intact without lesions or rashes.  Neurologic: Alert and oriented x 3.  Psych: Normal affect. Extremities: No clubbing or cyanosis.  HEENT: Normal.   TELEMETRY: Reviewed telemetry pt in sinus rhythm with T wave inversions.  LABS: Basic Metabolic Panel:  Recent Labs  04/27/15 0406 04/28/15 0514  NA 130* 132*  K 3.3* 3.7  CL 99 105  CO2 21 22  GLUCOSE 89 110*  BUN 15 18  CREATININE 1.44* 1.79*  CALCIUM 10.1 9.3   Liver Function Tests:  Recent Labs  04/26/15 1932 04/27/15 0406  AST 59* 48*  ALT 94* 80*  ALKPHOS 226* 203*  BILITOT 1.0 0.7  PROT 8.1 7.4  ALBUMIN 4.1 3.8   No results for input(s): LIPASE, AMYLASE in the last 72 hours. CBC:  Recent Labs  04/26/15 1932 04/27/15 0406  WBC 13.0* 13.6*  HGB 16.5  15.7  HCT 45.9 44.7  MCV 95.2 95.1  PLT 268 244   Cardiac Enzymes:  Recent Labs  04/27/15 1159 04/27/15 1610 04/27/15 2204  TROPONINI 2.96* 2.51* 2.35*   BNP: Invalid input(s): POCBNP D-Dimer: No results for input(s): DDIMER in the last 72 hours. Hemoglobin A1C: No results for input(s): HGBA1C in the last 72 hours. Fasting Lipid Panel: No results for input(s): CHOL, HDL, LDLCALC, TRIG, CHOLHDL, LDLDIRECT in the last 72 hours. Thyroid Function Tests: No results for input(s): TSH, T4TOTAL, T3FREE, THYROIDAB in the last 72 hours.  Invalid input(s): FREET3 Anemia Panel: No results for input(s): VITAMINB12, FOLATE, FERRITIN, TIBC, IRON, RETICCTPCT in the last 72 hours.  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  COMPARISON:  03/19/2015 bone survey  FINDINGS: Lung bases shows bilateral basilar posterior atelectasis. There is significant osteoporosis thoracolumbar spine.  There is interval moderate compression deformity of T12 vertebral body. Subacute compression fracture is suspected. Further correlation with MRI is recommended as clinically warranted.  Axial image 45 there is a lytic lesion L3 vertebral body measures about 1.4 cm. This is best seen on sagittal image 61. Metastatic disease cannot be excluded. Further correlation with MRI is recommended.  Unenhanced liver shows no biliary ductal dilatation. Small hiatal hernia.  Tiny calcified gallstones are noted within gallbladder the largest measures 3.8 mm. Unenhanced pancreas, spleen and adrenal glands are unremarkable. Unenhanced kidneys are symmetrical in size. No hydronephrosis or hydroureter. Nonobstructive calculus in lower pole of the right kidney  measures 4 mm. There is a cortical calcification in lower pole of the left kidney measures 3 mm.  No calcified ureteral calculi are noted bilaterally. Atherosclerotic calcifications of abdominal aorta and iliac arteries.  No small bowel obstruction. Moderate stool noted in right colon and cecum. No pericecal inflammation. The terminal ileum is unremarkable. Moderate distended urinary bladder. Prostate gland and seminal vesicles are unremarkable. Diffuse osteoporosis noted within pelvis and bilateral hips.  IMPRESSION: 1. Diffuse osteoporosis thoracolumbar spine. There is interval moderate compression deformity of T12 vertebral body. Subacute pathologic compression fracture cannot be excluded. There is a lytic lesion L3 vertebral body measures about 1.4 cm. Metastatic disease cannot be excluded. Further correlation with MRI is recommended. 2. Bilateral nonobstructive nephrolithiasis. No hydronephrosis or  hydroureter. 3. No calcified ureteral calculi. 4. No pericecal inflammation.  Unremarkable terminal ileum. 5. Small layering gallstones are noted within gallbladder the largest measures 3.8 mm. 6. Moderate distended urinary bladder. No stones are noted within urinary bladder   Electronically Signed   By: Lahoma Crocker M.D.   On: 04/16/2015 10:39   Mr Thoracic Spine Wo Contrast  04/17/2015   ADDENDUM REPORT: 04/17/2015 09:01  ADDENDUM: The MRI thoracic spine Impression should read: Pathologic compression fracture at T12 in this patient with widespread multiple myeloma, not T8. Findings discussed with Dr. Isidore Moos.   Electronically Signed   By: Rolla Flatten M.D.   On: 04/17/2015 09:01   04/17/2015   CLINICAL DATA:  Patient with multiple myeloma and acute back pain. Symptoms for several weeks, much worse in the past 4 days. Initial encounter.  EXAM: MRI THORACIC AND LUMBAR SPINE WITHOUT CONTRAST  TECHNIQUE: Multiplanar and multiecho pulse sequences of the thoracic and lumbar spine were obtained without intravenous  contrast.  COMPARISON:  CT urogram earlier today.  FINDINGS: MR THORACIC SPINE FINDINGS  Numbering of the thoracic spine was accomplished by counting down from the odontoid. New  Diffuse marrow signal abnormality consistent with myeloma. Minor endplate softening superiorly and inferiorly at T9. There is an acute to subacute compression fracture of T12 vertebral body with moderate BILATERAL endplate can cavity and loss of height estimated 30-50%. Retropulsion of abnormal bone at T12 without conus compression. No other pathologic compression deformities. Focal myeloma deposit midline T9 posteriorly measuring 10 x 10 x 20 mm. No significant mass effect on the spinal cord.  No significant thoracic disc protrusion or spinal stenosis. Mild annular bulging T7-T8. No cord compression at any level. Small layering BILATERAL effusions. Bibasilar atelectasis.  MR LUMBAR SPINE FINDINGS  Diffusely abnormal marrow consistent with the history of myeloma. Focal deposit posterior aspect of L3 on the RIGHT measures 19 x 13 x 15 mm. No pathologic compression deformity. Mild stenosis at L4-5 relates to ordinary spondylosis with annular bulging and facet arthropathy. Focal rightward protrusion at this level narrows the foramen, potentially affecting the L4 nerve root. No obstructive uropathy.  IMPRESSION: MR THORACIC SPINE IMPRESSION  Pathologic compression fracture at T8 in this patient with widespread multiple myeloma. Slight retropulsion of abnormal bone without conus compression.  MR LUMBAR SPINE IMPRESSION  Focal myeloma deposits at L3 and T9 without significant compression deformity.  Mild stenosis at L4-5 related to ordinary spondylosis.  Electronically Signed: By: Rolla Flatten M.D. On: 04/16/2015 20:14   Mr Lumbar Spine Wo Contrast  04/17/2015   ADDENDUM REPORT: 04/17/2015 09:01  ADDENDUM: The MRI thoracic spine Impression should read: Pathologic compression fracture at T12 in this patient with widespread multiple myeloma, not  T8. Findings discussed with Dr. Isidore Moos.   Electronically Signed   By: Rolla Flatten M.D.   On: 04/17/2015 09:01   04/17/2015   CLINICAL DATA:  Patient with multiple myeloma and acute back pain. Symptoms for several weeks, much worse in the past 4 days. Initial encounter.  EXAM: MRI THORACIC AND LUMBAR SPINE WITHOUT CONTRAST  TECHNIQUE: Multiplanar and multiecho pulse sequences of the thoracic and lumbar spine were obtained without intravenous contrast.  COMPARISON:  CT urogram earlier today.  FINDINGS: MR THORACIC SPINE FINDINGS  Numbering of the thoracic spine was accomplished by counting down from the odontoid. New  Diffuse marrow signal abnormality consistent with myeloma. Minor endplate softening superiorly and inferiorly at T9. There is an acute to subacute compression fracture of T12  vertebral body with moderate BILATERAL endplate can cavity and loss of height estimated 30-50%. Retropulsion of abnormal bone at T12 without conus compression. No other pathologic compression deformities. Focal myeloma deposit midline T9 posteriorly measuring 10 x 10 x 20 mm. No significant mass effect on the spinal cord.  No significant thoracic disc protrusion or spinal stenosis. Mild annular bulging T7-T8. No cord compression at any level. Small layering BILATERAL effusions. Bibasilar atelectasis.  MR LUMBAR SPINE FINDINGS  Diffusely abnormal marrow consistent with the history of myeloma. Focal deposit posterior aspect of L3 on the RIGHT measures 19 x 13 x 15 mm. No pathologic compression deformity. Mild stenosis at L4-5 relates to ordinary spondylosis with annular bulging and facet arthropathy. Focal rightward protrusion at this level narrows the foramen, potentially affecting the L4 nerve root. No obstructive uropathy.  IMPRESSION: MR THORACIC SPINE IMPRESSION  Pathologic compression fracture at T8 in this patient with widespread multiple myeloma. Slight retropulsion of abnormal bone without conus compression.  MR LUMBAR  SPINE IMPRESSION  Focal myeloma deposits at L3 and T9 without significant compression deformity.  Mild stenosis at L4-5 related to ordinary spondylosis.  Electronically Signed: By: Rolla Flatten M.D. On: 04/16/2015 20:14   Dg Chest Port 1 View  04/26/2015   CLINICAL DATA:  Hypoxia.  Tonsillar carcinoma.  Multiple myeloma.  EXAM: PORTABLE CHEST - 1 VIEW  COMPARISON:  None.  FINDINGS: The heart size and mediastinal contours are within normal limits. Mild linear opacity in the left lung base may be due to scarring or atelectasis. No evidence of pulmonary airspace disease or pleural effusion.  IMPRESSION: Mild left basilar atelectasis versus scarring.   Electronically Signed   By: Earle Gell M.D.   On: 04/26/2015 19:17   Ir Kypho Vertebral Thoracic Augmentation  04/19/2015   CLINICAL DATA:  T12 pathologic compression fracture  EXAM: T12 KYPHOPLASTY  FLUOROSCOPY TIME:  5 minutes and 5 seconds  MEDICATIONS AND MEDICAL HISTORY: General endotracheal anesthesia was provided  ANESTHESIA/SEDATION: Not applicable  CONTRAST:  None  PROCEDURE: The procedure, risks, benefits, and alternatives were explained to the patient. Questions regarding the procedure were encouraged and answered. The patient understands and consents to the procedure.  The back was prepped with Betadine in a sterile fashion, and a sterile drape was applied covering the operative field. A sterile gown and sterile gloves were used for the procedure.  Under fluoroscopic guidance, a 8 gauge needle was inserted into the T12 vertebral body via a left transpedicular approach. A drill was advanced to the anterior third of the vertebral body. A 20 mm balloon was inserted and insufflated. The balloon is positioned in the anterior third and at the midline. The cement trocar was inserted and cement was injected. No extra osseous cement was noted. After cement hardening, the patient was transferred to her bed without complication.  FINDINGS: Images demonstrate  needle placement in the T12 vertebral body via a left transpedicular approach. T12 balloon kyphoplasty is documented. The final image demonstrates cement filling the vertebral body, crossing the midline, and extending from the inferior to superior end plate. No extra osseous cement.  IMPRESSION: Successful T12 kyphoplasty.   Electronically Signed   By: [name] M.D.   On: 04/19/2015 11:37      ASSESSMENT AND PLAN: 1. Acute coronary syndrome with mild ST elevations: Pt appears to be entirely asymptomatic with very atypical chest pains which are similar to what he has experienced for past 20 years, lasting only seconds, and unlikely to  be related to cardiac disease. That being said, he has a markedly abnormal ECG and elevated troponins which are trending down (peaked at 3.85 on 4/29, down to 2.35 on 4/30) and an ischemic cardiomyopathy, EF 35-40%, with large apical aneurysm. He is already on ASA and heparin. No plans for coronary angiography given elevated creatinine, unless he were to develop worsening (and different) chest pain or becomes hemodynamically unstable with worsening ECG changes. He is hypotensive but asymptomatic. I will switch metoprolol tartrate to succinate 12.5 mg daily and continue Lipitor 40 mg daily. Would discharge on Plavix once he is stable. I would recommend a nuclear myocardial perfusion study in the near future. Unable to add ACEI due to CKD and hypotension. Has bibasilar rales, will obtain chest xray. May need low dose IV Lasix x 1.  2. Essential HTN: BP low normal. Will monitor given switch to metoprolol succinate.  3. Hyperlipidemia: Will continue high intensity statin therapy with Lipitor 40 mg daily.  4. 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   Kate Sable, M.D., F.A.C.C.

## 2015-04-28 NOTE — Progress Notes (Signed)
[facility] for IV Heparin Indication: Elevated troponin  Allergies  Allergen Reactions  . Hydrochlorothiazide Rash and Palpitations  . Asa [Aspirin] Nausea Only  . Hydrocodone Itching and Other (See Comments)    Jittery, hyperactivity   . Amoxicillin Itching and Rash  . Indapamide Palpitations    Lightheadedness, dizziness   Patient Measurements: Height: '5\' 8"'  (172.7 cm) Weight: 192 lb 11.2 oz (87.408 kg) IBW/kg (Calculated) : 68.4 Heparin Dosing Weight: 74 kg  Vital Signs: Temp: 97.6 F (36.4 C) (05/01 0514) Temp Source: Oral (05/01 0514) BP: 101/43 mmHg (05/01 0630) Pulse Rate: 58 (05/01 0630)  Labs:  Recent Labs  04/26/15 1932  04/27/15 0138 04/27/15 0406  04/27/15 1159 04/27/15 1610 04/27/15 2204 04/28/15 0514 04/28/15 0943  HGB 16.5  --   --  15.7  --   --   --   --   --   --   HCT 45.9  --   --  44.7  --   --   --   --   --   --   PLT 268  --   --  244  --   --   --   --   --   --   APTT  --   --  28  --   --   --   --   --   --   --   LABPROT  --   --  13.9 13.8  --   --   --   --   --   --   INR  --   --  1.06 1.05  --   --   --   --   --   --   HEPARINUNFRC  --   --   --   --   < >  --  0.57 0.56 0.28* 0.46  CREATININE 1.58*  --   --  1.44*  --   --   --   --  1.79*  --   TROPONINI  --   < >  --  3.27*  --  2.96* 2.51* 2.35*  --   --   < > = values in this interval not displayed. Estimated Creatinine Clearance: 44.2 mL/min (by C-G formula based on Cr of 1.79).  Medical History: Past Medical History  Diagnosis Date  . Hypertension   . Dyslipidemia   . Tonsillar cancer 2006  . Cholelithiases    Scheduled:  . acyclovir  400 mg Oral BID  . aspirin EC  325 mg Oral Daily  . atorvastatin  40 mg Oral q1800  . [START ON 05/03/2015] dexamethasone  40 mg Oral Weekly  . famotidine  20 mg Oral BID  . feeding supplement (ENSURE ENLIVE)  237 mL Oral BID BM  . metoprolol succinate  12.5 mg Oral Daily  . potassium chloride   40 mEq Oral TID  . senna-docusate  2 tablet Oral BID   Infusions:  . heparin 1,100 Units/hr (04/28/15 0345)   Assessment: 4 yoM admitted with syncope.  Pt with hx of multiple myeloma now with Trop=3.85.  IV Heparin per Rx for elevated troponin. EKG shows T wave abnormalities, STEMI.  Heparin 2000 unit bolus x1 (0200)  Started drip @ 900 units/hr 04/27/15  First Heparin level 0.22, sub therapeutic - 1000 unit bolus then increase rate to 1100 units/hr  2204 HL=0.56 (x2 therapeutic)  No reported bleeding or problems with heparin reported from  RN Today, 04/28/15  0514 HL=0.28, spoke with RN drip was stopped form 0215 to 0345. Will not change rate and recheck level @ 10am  Repeat level 0.46 units/ml on 1100 units/hr  Goal of Therapy:  Heparin level 0.3-0.7 units/ml Monitor platelets by anticoagulation protocol: Yes   Plan:   Continue Heparin at 1100 units/hr  Daily CBC/HL  Minda Ditto PharmD Pager [phone] 04/28/2015, 10:55 AM

## 2015-04-28 NOTE — Progress Notes (Signed)
Around 0100 pt woke up confused thinking it was 74 and that he was in [place]. Reoriented pt, helped with urinal, HR brady in 50's since taking metoprolol last evening, BP soft but stable, no other complaints of CP, SOB, dizziness. Later found pt sitting on side of bed with IV pulled out and tele off. Still confused. Became increasingly hostile and paranoid through the night. Called wife to ask if this behavior was typical of him. She said it wasn't and wonders if the new med (oxycontin) was causing the confusion. She noticed throughout the day he was saying some off the wall things. Called NP to make aware.  Rashon Westrup, Julieta Gutting RN

## 2015-04-29 ENCOUNTER — Ambulatory Visit
Admission: RE | Admit: 2015-04-29 | Discharge: 2015-04-29 | Disposition: A | Discharge status: Home / Self Care | Payer: Medicare Other | Source: Ambulatory Visit | Attending: Radiation Oncology | Admitting: Radiation Oncology

## 2015-04-29 DIAGNOSIS — R55 Syncope and collapse: Secondary | ICD-10-CM

## 2015-04-29 DIAGNOSIS — I2102 ST elevation (STEMI) myocardial infarction involving left anterior descending coronary artery: Secondary | ICD-10-CM

## 2015-04-29 DIAGNOSIS — C9000 Multiple myeloma not having achieved remission: Secondary | ICD-10-CM | POA: Diagnosis not present

## 2015-04-29 LAB — CBC WITH DIFFERENTIAL/PLATELET
Basophils Absolute: 0 10*3/uL (ref 0.0–0.1)
Basophils Relative: 0 % (ref 0–1)
EOS ABS: 0.3 10*3/uL (ref 0.0–0.7)
Eosinophils Relative: 4 % (ref 0–5)
HCT: 36.3 % — ABNORMAL LOW (ref 39.0–52.0)
Hemoglobin: 12.1 g/dL — ABNORMAL LOW (ref 13.0–17.0)
Lymphocytes Relative: 13 % (ref 12–46)
Lymphs Abs: 1.1 10*3/uL (ref 0.7–4.0)
MCH: 32.4 pg (ref 26.0–34.0)
MCHC: 33.3 g/dL (ref 30.0–36.0)
MCV: 97.1 fL (ref 78.0–100.0)
MONO ABS: 1.2 10*3/uL — AB (ref 0.1–1.0)
Monocytes Relative: 16 % — ABNORMAL HIGH (ref 3–12)
Neutro Abs: 5.2 10*3/uL (ref 1.7–7.7)
Neutrophils Relative %: 67 % (ref 43–77)
PLATELETS: 165 10*3/uL (ref 150–400)
RBC: 3.74 MIL/uL — ABNORMAL LOW (ref 4.22–5.81)
RDW: 12.8 % (ref 11.5–15.5)
WBC: 7.8 10*3/uL (ref 4.0–10.5)

## 2015-04-29 LAB — COMPREHENSIVE METABOLIC PANEL
ALBUMIN: 3.3 g/dL — AB (ref 3.5–5.0)
ALK PHOS: 183 U/L — AB (ref 38–126)
ALT: 82 U/L — ABNORMAL HIGH (ref 17–63)
ANION GAP: 5 (ref 5–15)
AST: 43 U/L — AB (ref 15–41)
BUN: 14 mg/dL (ref 6–20)
CO2: 23 mmol/L (ref 22–32)
CREATININE: 1.78 mg/dL — AB (ref 0.61–1.24)
Calcium: 9.1 mg/dL (ref 8.9–10.3)
Chloride: 106 mmol/L (ref 101–111)
GFR calc Af Amer: 44 mL/min — ABNORMAL LOW (ref >60)
GFR calc non Af Amer: 38 mL/min — ABNORMAL LOW (ref >60)
GLUCOSE: 111 mg/dL — AB (ref 70–99)
POTASSIUM: 3.5 mmol/L (ref 3.5–5.1)
Sodium: 134 mmol/L — ABNORMAL LOW (ref 135–145)
Total Bilirubin: 0.4 mg/dL (ref 0.3–1.2)
Total Protein: 6.1 g/dL — ABNORMAL LOW (ref 6.5–8.1)

## 2015-04-29 MED ORDER — SODIUM CHLORIDE 0.9 % IV SOLN: 250.0000 mL | INTRAVENOUS | Status: DC | PRN

## 2015-04-29 MED ORDER — SODIUM CHLORIDE 0.9 % IV SOLN: INTRAVENOUS | Status: DC

## 2015-04-29 MED ORDER — CLOPIDOGREL BISULFATE 75 MG PO TABS: 75.0000 mg | ORAL_TABLET | Freq: Every day | ORAL | Status: DC

## 2015-04-29 MED ORDER — SODIUM CHLORIDE 0.9 % IV SOLN
INTRAVENOUS | Status: DC
  Administered 2015-04-29 – 2015-04-30 (×2): via INTRAVENOUS

## 2015-04-29 MED ORDER — SODIUM CHLORIDE 0.9 % IJ SOLN: 3.0000 mL | INTRAMUSCULAR | Status: DC | PRN

## 2015-04-29 MED ORDER — METOPROLOL SUCCINATE ER 25 MG PO TB24: 12.5000 mg | ORAL_TABLET | Freq: Every day | ORAL | Status: DC

## 2015-04-29 MED ORDER — ASPIRIN 81 MG PO CHEW
81.0000 mg | CHEWABLE_TABLET | Freq: Every day | ORAL | Status: DC
  Administered 2015-04-29 – 2015-05-01 (×2): 81 mg via ORAL
  Filled 2015-04-29 (×2): qty 1

## 2015-04-29 MED ORDER — ASPIRIN 81 MG PO CHEW
81.0000 mg | CHEWABLE_TABLET | ORAL | Status: AC
  Administered 2015-04-30: 81 mg via ORAL
  Filled 2015-04-29: qty 1

## 2015-04-29 MED ORDER — SODIUM CHLORIDE 0.9 % IJ SOLN
3.0000 mL | Freq: Two times a day (BID) | INTRAMUSCULAR | Status: DC
  Administered 2015-04-29: 3 mL via INTRAVENOUS

## 2015-04-29 NOTE — Progress Notes (Signed)
SUBJECTIVE:  Chart reviewed.  He is not having chest pain.  No SOB.     PHYSICAL EXAM Filed Vitals:   04/28/15 2145 04/29/15 0534 04/29/15 0735 04/29/15 1007  BP: 104/59 90/48  98/46  Pulse: 61 37 76 66  Temp: 97.7 F (36.5 C) 97.5 F (36.4 C)    TempSrc: Oral Oral    Resp: 20 20    Height:      Weight:      SpO2: 93% 96%     General:  No distress Lungs:  Clear Heart:  RRR Abdomen:  Positive bowel sounds, no rebound no guarding Extremities:  No edema Neuro:  Nonfocal  LABS: Lab Results  Component Value Date   TROPONINI 2.35* 04/27/2015   Results for orders placed or performed during the hospital encounter of 04/26/15 (from the past 24 hour(s))  Comprehensive metabolic panel     Status: Abnormal   Collection Time: 04/29/15  4:27 AM  Result Value Ref Range   Sodium 134 (L) 135 - 145 mmol/L   Potassium 3.5 3.5 - 5.1 mmol/L   Chloride 106 101 - 111 mmol/L   CO2 23 22 - 32 mmol/L   Glucose, Bld 111 (H) 70 - 99 mg/dL   BUN 14 6 - 20 mg/dL   Creatinine, Ser 1.78 (H) 0.61 - 1.24 mg/dL   Calcium 9.1 8.9 - 10.3 mg/dL   Total Protein 6.1 (L) 6.5 - 8.1 g/dL   Albumin 3.3 (L) 3.5 - 5.0 g/dL   AST 43 (H) 15 - 41 U/L   ALT 82 (H) 17 - 63 U/L   Alkaline Phosphatase 183 (H) 38 - 126 U/L   Total Bilirubin 0.4 0.3 - 1.2 mg/dL   GFR calc non Af Amer 38 (L) >60 mL/min   GFR calc Af Amer 44 (L) >60 mL/min   Anion gap 5 5 - 15  CBC with Differential/Platelet     Status: Abnormal   Collection Time: 04/29/15  4:27 AM  Result Value Ref Range   WBC 7.8 4.0 - 10.5 K/uL   RBC 3.74 (L) 4.22 - 5.81 MIL/uL   Hemoglobin 12.1 (L) 13.0 - 17.0 g/dL   HCT 36.3 (L) 39.0 - 52.0 %   MCV 97.1 78.0 - 100.0 fL   MCH 32.4 26.0 - 34.0 pg   MCHC 33.3 30.0 - 36.0 g/dL   RDW 12.8 11.5 - 15.5 %   Platelets 165 150 - 400 K/uL   Neutrophils Relative % 67 43 - 77 %   Neutro Abs 5.2 1.7 - 7.7 K/uL   Lymphocytes Relative 13 12 - 46 %   Lymphs Abs 1.1 0.7 - 4.0 K/uL   Monocytes Relative 16 (H) 3  - 12 %   Monocytes Absolute 1.2 (H) 0.1 - 1.0 K/uL   Eosinophils Relative 4 0 - 5 %   Eosinophils Absolute 0.3 0.0 - 0.7 K/uL   Basophils Relative 0 0 - 1 %   Basophils Absolute 0.0 0.0 - 0.1 K/uL    Intake/Output Summary (Last 24 hours) at 04/29/15 1255 Last data filed at 04/29/15 1000  Gross per 24 hour  Intake 1209.44 ml  Output   2100 ml  Net -890.56 ml    TELEMETRY REVIEWED:    NSR, artifact noted.    ASSESSMENT AND PLAN:  ST ELEVATION MI:  I have reviewed the data and talked at length with the patient and his wife.  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  While this might represent Takatsubo's cardiomyopathy this diagnosis cannot be made without cath.  I explained the risk of the procedure to the patient.  The patient understands that risks included but are not limited to stroke (1 in 1000), death (1 in 18), kidney failure [usually temporary] (1 in 500), bleeding (1 in 200), allergic reaction [possibly serious] (1 in 200).  The patient understands and agrees to proceed.   He understand the risk of renal failure is higher given his situation.  He also understands that we might find a vessel that is patent (Broken Heart Syndrome) or occluded and he would have medical management, I would still suggest cath to look for disease that would require intervention.   I will hydrate.     Minus Breeding 04/29/2015 12:55 PM

## 2015-04-29 NOTE — Progress Notes (Signed)
   Weekly Management Note:  Inpatient  Multiple myeloma C90.00  Current Dose: 2 Gy  Projected Dose: 20 Gy   Narrative:  The patient presents for routine under treatment assessment.  CBCT/MVCT images/Port film x-rays were reviewed.  The chart was checked. He anticipates cardiac cath tomorrow.  Back pain is localized in same place, where kyphoplasty was done, but is better since recent admission  Physical Findings:  Wt Readings from Last 3 Encounters:  04/29/15 189 lb (85.73 kg)  04/16/15 202 lb (91.627 kg)  04/16/15 202 lb (91.627 kg)    height is _0  (1.727 m) and weight is 189 lb (85.73 kg). His oral temperature is 97.7 F (36.5 C). His blood pressure is 98/46 and his pulse is 72. His respiration is 22 and oxygen saturation is 98%.  NAD, in gurney  CBC    Component Value Date/Time   WBC 7.8 04/29/2015 0427   WBC 7.5 04/12/2015 0958   RBC 3.74* 04/29/2015 0427   RBC 4.40 04/12/2015 0958   HGB 12.1* 04/29/2015 0427   HGB 14.9 04/12/2015 0958   HCT 36.3* 04/29/2015 0427   HCT 42.3 04/12/2015 0958   PLT 165 04/29/2015 0427   PLT 293 04/12/2015 0958   MCV 97.1 04/29/2015 0427   MCV 96.1 04/12/2015 0958   MCH 32.4 04/29/2015 0427   MCH 33.9* 04/12/2015 0958   MCHC 33.3 04/29/2015 0427   MCHC 35.2 04/12/2015 0958   RDW 12.8 04/29/2015 0427   RDW 13.1 04/12/2015 0958   LYMPHSABS 1.1 04/29/2015 0427   LYMPHSABS 1.1 04/12/2015 0958   MONOABS 1.2* 04/29/2015 0427   MONOABS 0.6 04/12/2015 0958   EOSABS 0.3 04/29/2015 0427   EOSABS 0.0 04/12/2015 0958   BASOSABS 0.0 04/29/2015 0427   BASOSABS 0.0 04/12/2015 0958     CMP     Component Value Date/Time   NA 134* 04/29/2015 0427   NA 137 04/09/2015 1038   K 3.5 04/29/2015 0427   K 3.3* 04/09/2015 1038   CL 106 04/29/2015 0427   CO2 23 04/29/2015 0427   CO2 19* 04/09/2015 1038   GLUCOSE 111* 04/29/2015 0427   GLUCOSE 91 04/09/2015 1038   BUN 14 04/29/2015 0427   BUN 10.7 04/09/2015 1038   CREATININE 1.78* 04/29/2015  0427   CREATININE 1.6* 04/09/2015 1038   CALCIUM 9.1 04/29/2015 0427   CALCIUM 9.6 04/09/2015 1038   PROT 6.1* 04/29/2015 0427   PROT 8.9* 04/09/2015 1038   ALBUMIN 3.3* 04/29/2015 0427   ALBUMIN 4.3 04/09/2015 1038   AST 43* 04/29/2015 0427   AST 28 04/09/2015 1038   ALT 82* 04/29/2015 0427   ALT 41 04/09/2015 1038   ALKPHOS 183* 04/29/2015 0427   ALKPHOS 188* 04/09/2015 1038   BILITOT 0.4 04/29/2015 0427   BILITOT 0.49 04/09/2015 1038   GFRNONAA 38* 04/29/2015 0427   GFRAA 44* 04/29/2015 0427     Impression:  The patient is tolerating radiotherapy.   Plan:  Continue radiotherapy as planned. I will treat T11-L1 per my discussion with Dr Burr Medico, to minimize bone marrow exposure.  -----------------------------------  Eppie Gibson, MD

## 2015-04-29 NOTE — Progress Notes (Signed)
Eric Dudley:[id number] DOB: 01/08/1951 DOA: 04/26/2015 PCP: [name], MD  Brief narrative:  64 y/o recently admitted by this physician 4/19 for acute back pain where he was found to have a pathological T12 fracture IR consulted and recommended kyphoplasty which was performed on 04/19/15.  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 c LBP + Osteopenia + Pain, Neg Stress test 10/16/14 [[facility]]  Patient came to his regular scheduled injection of Velcade at the cancer center 4/29 and was being mapped for XRT and while being transferred from chair to table he was dropped down below usual 30  He developed episodic syncope and he came back to normal within 20 seconds-he has never had an issue like this before  On admission while working up for syncope noted EKG changes and elevated troponin  Past medical history-As per Problem list Chart reviewed as below-   Consultants:  Cardiology  Procedures:  none  Antibiotics:  nad   Subjective   No cp,n,v,sob tol po well Again not able to tell me if specificCP   Objective    Interim History:   Telemetry: nsr   Objective: Filed Vitals:   04/28/15 2145 04/29/15 0534 04/29/15 0735 04/29/15 1007  BP: 104/59 90/48  98/46  Pulse: 61 37 76 66  Temp: 97.7 F (36.5 C) 97.5 F (36.4 C)    TempSrc: Oral Oral    Resp: 20 20    Height:      Weight:      SpO2: 93% 96%      Intake/Output Summary (Last 24 hours) at 04/29/15 1335 Last data filed at 04/29/15 1000  Gross per 24 hour  Intake 1209.44 ml  Output   2100 ml  Net -890.56 ml    Exam:  General: eomi, ncat Cardiovascular:  s1s 2 no m/r/g Respiratory: clear no added sound Abdomen:  Soft, NT, ND Skin no le edema   Data Reviewed: Basic Metabolic Panel:  Recent Labs Lab 04/23/15 0841 04/26/15 1932 04/27/15 0406 04/28/15 0514 04/29/15 0427  NA 132* 124* 130* 132* 134*  K 3.1*  3.4* 3.3* 3.7 3.5  CL 104 96 99 105 106  CO2 '23 20 21 22 23  ' GLUCOSE 123* 169* 89 110* 111*  BUN '10 15 15 18 14  ' CREATININE 1.70* 1.58* 1.44* 1.79* 1.78*  CALCIUM 9.4 9.6 10.1 9.3 9.1   Liver Function Tests:  Recent Labs Lab 04/26/15 1932 04/27/15 0406 04/29/15 0427  AST 59* 48* 43*  ALT 94* 80* 82*  ALKPHOS 226* 203* 183*  BILITOT 1.0 0.7 0.4  PROT 8.1 7.4 6.1*  ALBUMIN 4.1 3.8 3.3*   No results for input(s): LIPASE, AMYLASE in the last 168 hours. No results for input(s): AMMONIA in the last 168 hours. CBC:  Recent Labs Lab 04/26/15 1932 04/27/15 0406 04/28/15 1112 04/29/15 0427  WBC 13.0* 13.6* 9.8 7.8  NEUTROABS  --   --  7.3 5.2  HGB 16.5 15.7 13.4 12.1*  HCT 45.9 44.7 39.3 36.3*  MCV 95.2 95.1 97.5 97.1  PLT 268 244 205 165   Cardiac Enzymes:  Recent Labs Lab 04/26/15 2330 04/27/15 0406 04/27/15 1159 04/27/15 1610 04/27/15 2204  TROPONINI 3.85* 3.27* 2.96* 2.51* 2.35*   BNP: Invalid input(s): POCBNP CBG: No results for input(s): GLUCAP in the last 168 hours.  No results found for this or any previous visit (from the past 240 hour(s)).   Studies:  All Imaging reviewed and is as per above notation   Scheduled Meds: . acyclovir  400 mg Oral BID  . aspirin EC  325 mg Oral Daily  . atorvastatin  40 mg Oral q1800  . clopidogrel  75 mg Oral Daily  . [START ON 05/03/2015] dexamethasone  40 mg Oral Weekly  . famotidine  20 mg Oral BID  . feeding supplement (ENSURE ENLIVE)  237 mL Oral BID BM  . metoprolol succinate  12.5 mg Oral Daily  . potassium chloride  40 mEq Oral TID  . senna-docusate  2 tablet Oral BID   Continuous Infusions:     Assessment/Plan:  1. ACS, STEMI?-patient placed on Heparin Gtt-I have given ASA 325 chewable.  Troponin trend seems to be stable-trend has declined. He was heparinized until 17:00 04/28/15- Plavix 75 daily as per Cardiologist. His Creat ~ 1.4-1.7-Per Cardiologist will need Cath this admission which is  being set-up for 05/20/15 as per Cardiology 2. Metabolic encephalopathy 2/2 to opiates-his confusion was transient overnight 4/30 and opiates d/c'd-resolved 3. Multiple myeloma with complication of lytic lesion T11-status post kyphoplasty 4/22-management as per radiation oncology. Continue suppressive acyclovir 400 twice a day, continue Decadron 40 mg with Velcade.  Velcade will be held for right now [just received a dose at [facility] 4.29].  Revlimid-held for now. Patient to have XRT at some point?-Dr. Kinnard is aware. 4. HTN (hypertension)-history of--he was discontinued off of his ARB on discharge 4/26 secondary to chronic kidney disease.  Continue Toprol 12.5 XL 5. Hyperlipidemia-I have held the patient's Vytorin as well as other nonessential vitamins for now 6. Tonsillar cancer-history of 7.  CKD (chronic kidney disease), stage II--see above discussion 8. 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 Patient to continue potassium based on further lab testing 9. Hyponatremia-? 2/2 to cancer state, Potomania-was on IV saline 75 cc/hr--as slight bump in creatinine overnight will hydrate saline 75 cc.hr   Code Status: Full Family Communication:  Wife + Disposition Plan: tele   [name], MD  [facility] Pager [phone] 04/29/2015, 1:35 PM    LOS: 3 days

## 2015-04-30 ENCOUNTER — Encounter (HOSPITAL_COMMUNITY)
Admission: AD | Disposition: A | Discharge status: Skilled Nursing Facility | Payer: TRICARE For Life (TFL) | Source: Ambulatory Visit | Attending: Family Medicine

## 2015-04-30 ENCOUNTER — Other Ambulatory Visit: Payer: TRICARE For Life (TFL)

## 2015-04-30 ENCOUNTER — Ambulatory Visit
Admission: RE | Admit: 2015-04-30 | Discharge: 2015-04-30 | Disposition: A | Discharge status: Home / Self Care | Payer: Medicare Other | Source: Ambulatory Visit | Attending: Radiation Oncology | Admitting: Radiation Oncology

## 2015-04-30 ENCOUNTER — Encounter (HOSPITAL_COMMUNITY)
Admission: AD | Disposition: A | Discharge status: Skilled Nursing Facility | Payer: Self-pay | Source: Ambulatory Visit | Attending: Family Medicine

## 2015-04-30 ENCOUNTER — Ambulatory Visit: Payer: Medicare Other | Admitting: Radiation Oncology

## 2015-04-30 ENCOUNTER — Ambulatory Visit: Payer: TRICARE For Life (TFL) | Admitting: Radiation Oncology

## 2015-04-30 ENCOUNTER — Ambulatory Visit: Payer: TRICARE For Life (TFL)

## 2015-04-30 DIAGNOSIS — I2510 Atherosclerotic heart disease of native coronary artery without angina pectoris: Secondary | ICD-10-CM

## 2015-04-30 HISTORY — PX: CARDIAC CATHETERIZATION: SHX172

## 2015-04-30 LAB — BASIC METABOLIC PANEL
Anion gap: 4 — ABNORMAL LOW (ref 5–15)
BUN: 9 mg/dL (ref 6–20)
CALCIUM: 9.1 mg/dL (ref 8.9–10.3)
CO2: 20 mmol/L — ABNORMAL LOW (ref 22–32)
CREATININE: 1.32 mg/dL — AB (ref 0.61–1.24)
Chloride: 112 mmol/L — ABNORMAL HIGH (ref 101–111)
GFR calc non Af Amer: 55 mL/min — ABNORMAL LOW (ref >60)
Glucose, Bld: 106 mg/dL — ABNORMAL HIGH (ref 70–99)
POTASSIUM: 3 mmol/L — AB (ref 3.5–5.1)
Sodium: 136 mmol/L (ref 135–145)

## 2015-04-30 SURGERY — LEFT HEART CATH AND CORONARY ANGIOGRAPHY

## 2015-04-30 MED ORDER — HEPARIN SODIUM (PORCINE) 1000 UNIT/ML IJ SOLN
INTRAMUSCULAR | Status: DC | PRN
  Administered 2015-04-30: 4500 [IU] via INTRAVENOUS

## 2015-04-30 MED ORDER — NITROGLYCERIN 1 MG/10 ML FOR IR/CATH LAB
INTRA_ARTERIAL | Status: AC
  Filled 2015-04-30: qty 10

## 2015-04-30 MED ORDER — MIDAZOLAM HCL 2 MG/2ML IJ SOLN
INTRAMUSCULAR | Status: AC
  Filled 2015-04-30: qty 2

## 2015-04-30 MED ORDER — FENTANYL CITRATE (PF) 100 MCG/2ML IJ SOLN
INTRAMUSCULAR | Status: DC | PRN
  Administered 2015-04-30: 25 ug via INTRAVENOUS

## 2015-04-30 MED ORDER — LIDOCAINE HCL (PF) 1 % IJ SOLN
INTRAMUSCULAR | Status: AC
  Filled 2015-04-30: qty 30

## 2015-04-30 MED ORDER — ACETAMINOPHEN 325 MG PO TABS
ORAL_TABLET | ORAL | Status: AC
  Filled 2015-04-30: qty 2

## 2015-04-30 MED ORDER — VERAPAMIL HCL 2.5 MG/ML IV SOLN
INTRAVENOUS | Status: AC
  Filled 2015-04-30: qty 2

## 2015-04-30 MED ORDER — MIDAZOLAM HCL 2 MG/2ML IJ SOLN
INTRAMUSCULAR | Status: DC | PRN
  Administered 2015-04-30: 1 mg via INTRAVENOUS

## 2015-04-30 MED ORDER — SODIUM CHLORIDE 0.9 % IJ SOLN: 3.0000 mL | INTRAMUSCULAR | Status: DC | PRN

## 2015-04-30 MED ORDER — POTASSIUM CHLORIDE CRYS ER 20 MEQ PO TBCR
40.0000 meq | EXTENDED_RELEASE_TABLET | Freq: Once | ORAL | Status: AC
  Administered 2015-04-30: 40 meq via ORAL
  Filled 2015-04-30: qty 2

## 2015-04-30 MED ORDER — VERAPAMIL HCL 2.5 MG/ML IV SOLN
INTRAVENOUS | Status: DC | PRN
  Administered 2015-04-30: 12:00:00 via INTRA_ARTERIAL

## 2015-04-30 MED ORDER — SODIUM CHLORIDE 0.9 % WEIGHT BASED INFUSION: 3.0000 mL/kg/h | INTRAVENOUS | Status: AC

## 2015-04-30 MED ORDER — HEPARIN (PORCINE) IN NACL 2-0.9 UNIT/ML-% IJ SOLN
INTRAMUSCULAR | Status: AC
  Filled 2015-04-30: qty 1500

## 2015-04-30 MED ORDER — IOHEXOL 350 MG/ML SOLN
INTRAVENOUS | Status: DC | PRN
  Administered 2015-04-30: 100 mL via INTRA_ARTERIAL

## 2015-04-30 MED ORDER — FENTANYL CITRATE (PF) 100 MCG/2ML IJ SOLN
INTRAMUSCULAR | Status: AC
  Filled 2015-04-30: qty 2

## 2015-04-30 MED ORDER — SODIUM CHLORIDE 0.9 % IJ SOLN
3.0000 mL | Freq: Two times a day (BID) | INTRAMUSCULAR | Status: DC
  Administered 2015-05-01: 3 mL via INTRAVENOUS

## 2015-04-30 MED ORDER — SODIUM CHLORIDE 0.9 % IV SOLN: 250.0000 mL | INTRAVENOUS | Status: DC | PRN

## 2015-04-30 MED ORDER — HEPARIN SODIUM (PORCINE) 1000 UNIT/ML IJ SOLN
INTRAMUSCULAR | Status: AC
  Filled 2015-04-30: qty 1

## 2015-04-30 SURGICAL SUPPLY — 16 items
CATH INFINITI 5 FR JL3.5 (CATHETERS) ×3 IMPLANT
CATH INFINITI 5FR ANG PIGTAIL (CATHETERS) ×3 IMPLANT
CATH INFINITI JR4 5F (CATHETERS) ×3 IMPLANT
CATH LAUNCHER 5F JL3 (CATHETERS) ×1 IMPLANT
CATH LAUNCHER 5F RADL (CATHETERS) ×1 IMPLANT
CATHETER LAUNCHER 5F JL3 (CATHETERS) ×3
CATHETER LAUNCHER 5F RADL (CATHETERS) ×3
DEVICE RAD COMP TR BAND LRG (VASCULAR PRODUCTS) ×3 IMPLANT
GLIDESHEATH SLEND SS 6F .021 (SHEATH) ×3 IMPLANT
KIT HEART LEFT (KITS) ×3 IMPLANT
PACK CARDIAC CATHETERIZATION (CUSTOM PROCEDURE TRAY) ×3 IMPLANT
SYR MEDRAD MARK V 150ML (SYRINGE) ×3 IMPLANT
TRANSDUCER W/STOPCOCK (MISCELLANEOUS) ×3 IMPLANT
TUBING CIL FLEX 10 FLL-RA (TUBING) ×3 IMPLANT
WIRE HI TORQ VERSACORE-J 145CM (WIRE) ×3 IMPLANT
WIRE SAFE-T 1.5MM-J .035X260CM (WIRE) ×3 IMPLANT

## 2015-04-30 NOTE — Progress Notes (Signed)
Patient had neg Cath R arm restricted till 15:00 on 05/01/15 Can likely d/c in am  [name], MD [facility] [phone]

## 2015-04-30 NOTE — Discharge Instructions (Signed)
[facility] Stay Proper nutrition can help your body recover from illness and injury.   Foods and beverages high in protein, vitamins, and minerals help rebuild muscle loss, promote healing, & reduce fall risk.   In addition to eating healthy foods, a nutrition shake is an easy, delicious way to get the nutrition you need during and after your hospital stay  It is recommended that you continue to drink 2 bottles per day of:       Ensure Enlive for at least 1 month (30 days) after your hospital stay   Tips for adding a nutrition shake into your routine: As allowed, drink one with vitamins or medications instead of water or juice Enjoy one as a tasty mid-morning or afternoon snack Drink cold or make a milkshake out of it Drink one instead of milk with cereal or snacks Use as a coffee creamer   Available at the following grocery stores and pharmacies:           * [place] [phone]            For COUPONS visit: www.ensure.com/join or http://dawson-may.com/  Radial Site Care   Suggested Substitutions Ensure Plus = Boost Plus = Carnation Breakfast Essentials = Boost Compact Ensure Active Clear = Boost Breeze Glucerna Shake = Boost Glucose Control = Carnation Breakfast Essentials SUGAR FREE   Refer to this sheet in the next few weeks. These instructions provide you with information on caring for yourself after your procedure. Your caregiver may also give you more specific instructions. Your treatment has been planned according to current medical practices, but problems sometimes occur. Call your caregiver if you have any problems or questions after your procedure. HOME CARE INSTRUCTIONS  You may shower the day after the procedure.Remove the bandage (dressing) and gently wash the site with plain soap and  water.Gently pat the site dry.  Do not apply powder or lotion to the site.  Do not submerge the affected site in water for 3 to 5 days.  Inspect the site at least twice daily.  Do not flex or bend the affected arm for 24 hours.  No lifting over 5 pounds (2.3 kg) for 5 days after your procedure.  Do not drive home if you are discharged the same day of the procedure. Have someone else drive you.  You may drive 24 hours after the procedure unless otherwise instructed by your caregiver.  Do not operate machinery or power tools for 24 hours.  A responsible adult should be with you for the first 24 hours after you arrive home. What to expect:  Any bruising will usually fade within 1 to 2 weeks.  Blood that collects in the tissue (hematoma) may be painful to the touch. It should usually decrease in size and tenderness within 1 to 2 weeks. SEEK IMMEDIATE MEDICAL CARE IF:  You have unusual pain at the radial site.  You have redness, warmth, swelling, or pain at the radial site.  You have drainage (other than a small amount of blood on the dressing).  You have chills.  You have a fever or persistent symptoms for more than 72 hours.  You have a fever and your symptoms  suddenly get worse.  Your arm becomes pale, cool, tingly, or numb.  You have heavy bleeding from the site. Hold pressure on the site. Document Released: 01/16/2011 Document Revised: 03/07/2012 Document Reviewed: 01/16/2011 [facility] Patient Information 2015 [place], [place]. This information is not intended to replace advice given to you by your health care provider. Make sure you discuss any questions you have with your health care provider.

## 2015-04-30 NOTE — Progress Notes (Signed)
Ate [place] sandwich. Wife in to see.

## 2015-04-30 NOTE — Progress Notes (Addendum)
JEFFREY GRAEFE JQD:[id number] DOB: [birth year] DOA: 04/26/2015 PCP: [name], MD  Brief narrative:  64 y/o recently admitted by this physician 4/19 for acute back pain where he was found to have a pathological T12 fracture IR consulted and recommended kyphoplasty which was performed on 04/19/15.  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  Eventually he underwent cardiac cath 5/3  He has developed a mild metabolic acidosis probably 2/2 to his renal issues and may need to be placed on Bicarb  Past medical history-As per Problem list Chart reviewed as below-   Consultants:  Cardiology  Procedures:  none  Antibiotics:  nad   Subjective   Ready for Cath Still has bilateral radiating pains across ribs-consistent with path # No Radiating arm/jaw nor other pains      Objective    Interim History:   Telemetry: nsr   Objective: Filed Vitals:   04/29/15 1400 04/29/15 1406 04/29/15 2058 04/30/15 0515  BP:   116/55 104/53  Pulse: 72  80 58  Temp: 97.7 F (36.5 C)  97.8 F (36.6 C) 97.8 F (36.6 C)  TempSrc: Oral  Oral Oral  Resp: '22  20 20  ' Height:      Weight:  85.73 kg (189 lb)    SpO2: 98%  95% 96%    Intake/Output Summary (Last 24 hours) at 04/30/15 0853 Last data filed at 04/30/15 0700  Gross per 24 hour  Intake 2133.75 ml  Output   1325 ml  Net 808.75 ml    Exam:  General: eomi, ncat Cardiovascular:  s1s 2 no  m/r/g Respiratory: clear no added sound Abdomen:  Soft, NT, ND Skin no le edema   Data Reviewed: Basic Metabolic Panel:  Recent Labs Lab 04/26/15 1932 04/27/15 0406 04/28/15 0514 04/29/15 0427 04/30/15 0605  NA 124* 130* 132* 134* 136  K 3.4* 3.3* 3.7 3.5 3.0*  CL 96 99 105 106 112*  CO2 '20 21 22 23 ' 20*  GLUCOSE 169* 89 110* 111* 106*  BUN '15 15 18 14 9  ' CREATININE 1.58* 1.44* 1.79* 1.78* 1.32*  CALCIUM 9.6 10.1 9.3 9.1 9.1   Liver Function Tests:  Recent Labs Lab 04/26/15 1932 04/27/15 0406 04/29/15 0427  AST 59* 48* 43*  ALT 94* 80* 82*  ALKPHOS 226* 203* 183*  BILITOT 1.0 0.7 0.4  PROT 8.1 7.4 6.1*  ALBUMIN 4.1 3.8 3.3*   No results for input(s): LIPASE, AMYLASE in the last 168 hours. No results for input(s): AMMONIA in the last 168 hours. CBC:  Recent Labs Lab 04/26/15 1932 04/27/15 0406 04/28/15 1112 04/29/15 0427  WBC 13.0* 13.6* 9.8 7.8  NEUTROABS  --   --  7.3 5.2  HGB 16.5 15.7 13.4 12.1*  HCT 45.9 44.7 39.3 36.3*  MCV 95.2 95.1 97.5 97.1  PLT 268 244 205 165   Cardiac Enzymes:  Recent Labs Lab 04/26/15 2330 04/27/15 0406 04/27/15 1159 04/27/15 1610 04/27/15 2204  TROPONINI 3.85* 3.27* 2.96* 2.51* 2.35*   BNP: Invalid input(s): POCBNP CBG: No results for input(s): GLUCAP in the last 168 hours.  No results found for this or any previous visit (from the past 240 hour(s)).   Studies:              All Imaging reviewed and is as per above notation   Scheduled Meds: . acyclovir  400 mg Oral BID  . aspirin  81 mg Oral Daily  . atorvastatin  40 mg Oral q1800  . clopidogrel  75 mg Oral Daily  . [START ON 05/03/2015] dexamethasone  40 mg Oral Weekly  . famotidine  20 mg Oral BID  . feeding supplement (ENSURE ENLIVE)  237 mL Oral BID BM  . metoprolol succinate  12.5 mg Oral Daily  . potassium chloride  40 mEq Oral TID  . senna-docusate  2 tablet Oral BID  . sodium chloride  3 mL Intravenous Q12H   Continuous Infusions: . sodium  chloride 75 mL/hr at 04/30/15 0348  . sodium chloride       Assessment/Plan:  1. ACS, STEMI?-patient placed on Heparin Gtt-continue ASA 325 chewable + plavix.  Troponin has declined. He was heparinized until 04/28/15- Plavix 75 daily as per Cardiologist. His Creat ~ 1.4-1.7-->1.3-Cath scheduled 10:30 this am-appreciate Cardiology input 2. Metabolic encephalopathy 2/2 to opiates-his confusion was transient overnight 4/30 and opiates d/c'd-resolved 3. Multiple myeloma with complication of lytic lesion T11-status post kyphoplasty 4/22-management as per radiation oncology. Continue suppressive acyclovir 400 twice a day, continue Decadron 40 mg with Velcade.  Velcade will be held for right now [just received a dose at [place] 4.29].  Revlimid-held for now. Patient to have XRT at some point?-For XRT per Dr. Theone Murdoch input 4. HTN (hypertension)-history of--he was discontinued off of his ARB on discharge 4/26 secondary to chronic kidney disease.  Continue Toprol 12.5 XL 5. Hyperlipidemia-I have held the patient's Vytorin as well as other nonessential vitamins for now 6. Tonsillar cancer-history of 7.  CKD (chronic kidney disease), stage II--see above discussion--His creatinine on 5/3 was 1.3. 8. 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 Patient to continue potassium  9. Hyponatremia-? 2/2 to cancer state, Potomania-was on IV saline 75 cc/hr--as slight bump in creatinine overnight will hydrate saline 75 cc.hr   Code Status: Full Family Communication:  Wife + and explained in detail approached for therapy.  He will go to [facility] for Cardiac cath and may Lu Duffel not return dependant on findings Disposition Plan: tele   [name], MD  [facility] Pager [phone] 04/30/2015, 8:53 AM    LOS: 4 days

## 2015-04-30 NOTE — Interval H&P Note (Signed)
History and Physical Interval Note:  04/30/2015 12:00 PM  Eric Dudley  has presented today for surgery, with the diagnosis of cp  The various methods of treatment have been discussed with the patient and family. After consideration of risks, benefits and other options for treatment, the patient has consented to  Procedure(s): Left Heart Cath and Coronary Angiography (N/A) as a surgical intervention .  The patient's history has been reviewed, patient examined, no change in status, stable for surgery.  I have reviewed the patient's chart and labs.  Questions were answered to the patient's satisfaction.   Cath Lab Visit (complete for each Cath Lab visit)  Clinical Evaluation Leading to the Procedure:   ACS: Yes.    Non-ACS:    Anginal Classification: CCS III  Anti-ischemic medical therapy: Minimal Therapy (1 class of medications)  Non-Invasive Test Results: No non-invasive testing performed  Prior CABG: No previous CABG        [name] 04/30/2015 12:00 PM

## 2015-04-30 NOTE — H&P (View-Only) (Signed)
SUBJECTIVE:  Chart reviewed.  He is not having chest pain.  No SOB.     PHYSICAL EXAM Filed Vitals:   04/28/15 2145 04/29/15 0534 04/29/15 0735 04/29/15 1007  BP: 104/59 90/48  98/46  Pulse: 61 37 76 66  Temp: 97.7 F (36.5 C) 97.5 F (36.4 C)    TempSrc: Oral Oral    Resp: 20 20    Height:      Weight:      SpO2: 93% 96%     General:  No distress Lungs:  Clear Heart:  RRR Abdomen:  Positive bowel sounds, no rebound no guarding Extremities:  No edema Neuro:  Nonfocal  LABS: Lab Results  Component Value Date   TROPONINI 2.35* 04/27/2015   Results for orders placed or performed during the hospital encounter of 04/26/15 (from the past 24 hour(s))  Comprehensive metabolic panel     Status: Abnormal   Collection Time: 04/29/15  4:27 AM  Result Value Ref Range   Sodium 134 (L) 135 - 145 mmol/L   Potassium 3.5 3.5 - 5.1 mmol/L   Chloride 106 101 - 111 mmol/L   CO2 23 22 - 32 mmol/L   Glucose, Bld 111 (H) 70 - 99 mg/dL   BUN 14 6 - 20 mg/dL   Creatinine, Ser 1.78 (H) 0.61 - 1.24 mg/dL   Calcium 9.1 8.9 - 10.3 mg/dL   Total Protein 6.1 (L) 6.5 - 8.1 g/dL   Albumin 3.3 (L) 3.5 - 5.0 g/dL   AST 43 (H) 15 - 41 U/L   ALT 82 (H) 17 - 63 U/L   Alkaline Phosphatase 183 (H) 38 - 126 U/L   Total Bilirubin 0.4 0.3 - 1.2 mg/dL   GFR calc non Af Amer 38 (L) >60 mL/min   GFR calc Af Amer 44 (L) >60 mL/min   Anion gap 5 5 - 15  CBC with Differential/Platelet     Status: Abnormal   Collection Time: 04/29/15  4:27 AM  Result Value Ref Range   WBC 7.8 4.0 - 10.5 K/uL   RBC 3.74 (L) 4.22 - 5.81 MIL/uL   Hemoglobin 12.1 (L) 13.0 - 17.0 g/dL   HCT 36.3 (L) 39.0 - 52.0 %   MCV 97.1 78.0 - 100.0 fL   MCH 32.4 26.0 - 34.0 pg   MCHC 33.3 30.0 - 36.0 g/dL   RDW 12.8 11.5 - 15.5 %   Platelets 165 150 - 400 K/uL   Neutrophils Relative % 67 43 - 77 %   Neutro Abs 5.2 1.7 - 7.7 K/uL   Lymphocytes Relative 13 12 - 46 %   Lymphs Abs 1.1 0.7 - 4.0 K/uL   Monocytes Relative 16 (H) 3  - 12 %   Monocytes Absolute 1.2 (H) 0.1 - 1.0 K/uL   Eosinophils Relative 4 0 - 5 %   Eosinophils Absolute 0.3 0.0 - 0.7 K/uL   Basophils Relative 0 0 - 1 %   Basophils Absolute 0.0 0.0 - 0.1 K/uL    Intake/Output Summary (Last 24 hours) at 04/29/15 1255 Last data filed at 04/29/15 1000  Gross per 24 hour  Intake 1209.44 ml  Output   2100 ml  Net -890.56 ml    TELEMETRY REVIEWED:    NSR, artifact noted.    ASSESSMENT AND PLAN:  ST ELEVATION MI:  I have reviewed the data and talked at length with the patient and his wife.  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  While this might represent Takatsubo's cardiomyopathy this diagnosis cannot be made without cath.  I explained the risk of the procedure to the patient.  The patient understands that risks included but are not limited to stroke (1 in 1000), death (1 in 18), kidney failure [usually temporary] (1 in 500), bleeding (1 in 200), allergic reaction [possibly serious] (1 in 200).  The patient understands and agrees to proceed.   He understand the risk of renal failure is higher given his situation.  He also understands that we might find a vessel that is patent (Broken Heart Syndrome) or occluded and he would have medical management, I would still suggest cath to look for disease that would require intervention.   I will hydrate.     Minus Breeding 04/29/2015 12:55 PM

## 2015-05-01 ENCOUNTER — Other Ambulatory Visit: Payer: Self-pay | Admitting: Hematology

## 2015-05-01 ENCOUNTER — Encounter (HOSPITAL_COMMUNITY): Payer: Self-pay | Admitting: Cardiology

## 2015-05-01 ENCOUNTER — Telehealth: Payer: Self-pay | Admitting: Hematology

## 2015-05-01 ENCOUNTER — Ambulatory Visit
Admission: RE | Admit: 2015-05-01 | Discharge: 2015-05-01 | Disposition: A | Discharge status: Home / Self Care | Payer: Medicare Other | Source: Ambulatory Visit | Attending: Radiation Oncology | Admitting: Radiation Oncology

## 2015-05-01 ENCOUNTER — Ambulatory Visit: Payer: Medicare Other

## 2015-05-01 DIAGNOSIS — I214 Non-ST elevation (NSTEMI) myocardial infarction: Secondary | ICD-10-CM | POA: Insufficient documentation

## 2015-05-01 DIAGNOSIS — M8580 Other specified disorders of bone density and structure, unspecified site: Secondary | ICD-10-CM | POA: Diagnosis not present

## 2015-05-01 DIAGNOSIS — Z510 Encounter for antineoplastic radiation therapy: Secondary | ICD-10-CM | POA: Diagnosis not present

## 2015-05-01 DIAGNOSIS — R55 Syncope and collapse: Secondary | ICD-10-CM | POA: Diagnosis not present

## 2015-05-01 DIAGNOSIS — M549 Dorsalgia, unspecified: Secondary | ICD-10-CM | POA: Diagnosis not present

## 2015-05-01 DIAGNOSIS — E871 Hypo-osmolality and hyponatremia: Secondary | ICD-10-CM | POA: Diagnosis not present

## 2015-05-01 DIAGNOSIS — E785 Hyperlipidemia, unspecified: Secondary | ICD-10-CM | POA: Diagnosis not present

## 2015-05-01 DIAGNOSIS — G893 Neoplasm related pain (acute) (chronic): Secondary | ICD-10-CM | POA: Diagnosis not present

## 2015-05-01 DIAGNOSIS — N179 Acute kidney failure, unspecified: Secondary | ICD-10-CM | POA: Diagnosis not present

## 2015-05-01 DIAGNOSIS — R2689 Other abnormalities of gait and mobility: Secondary | ICD-10-CM | POA: Diagnosis not present

## 2015-05-01 DIAGNOSIS — Z8589 Personal history of malignant neoplasm of other organs and systems: Secondary | ICD-10-CM | POA: Diagnosis not present

## 2015-05-01 DIAGNOSIS — N182 Chronic kidney disease, stage 2 (mild): Secondary | ICD-10-CM | POA: Diagnosis not present

## 2015-05-01 DIAGNOSIS — C9000 Multiple myeloma not having achieved remission: Secondary | ICD-10-CM | POA: Diagnosis not present

## 2015-05-01 DIAGNOSIS — I429 Cardiomyopathy, unspecified: Secondary | ICD-10-CM

## 2015-05-01 DIAGNOSIS — C099 Malignant neoplasm of tonsil, unspecified: Secondary | ICD-10-CM | POA: Diagnosis not present

## 2015-05-01 DIAGNOSIS — I10 Essential (primary) hypertension: Secondary | ICD-10-CM | POA: Diagnosis not present

## 2015-05-01 DIAGNOSIS — Z5112 Encounter for antineoplastic immunotherapy: Secondary | ICD-10-CM | POA: Diagnosis not present

## 2015-05-01 DIAGNOSIS — I2510 Atherosclerotic heart disease of native coronary artery without angina pectoris: Secondary | ICD-10-CM | POA: Diagnosis not present

## 2015-05-01 DIAGNOSIS — I428 Other cardiomyopathies: Secondary | ICD-10-CM | POA: Insufficient documentation

## 2015-05-01 DIAGNOSIS — S22080G Wedge compression fracture of T11-T12 vertebra, subsequent encounter for fracture with delayed healing: Secondary | ICD-10-CM | POA: Diagnosis not present

## 2015-05-01 LAB — COMPREHENSIVE METABOLIC PANEL
ALBUMIN: 3.4 g/dL — AB (ref 3.5–5.0)
ALT: 98 U/L — AB (ref 17–63)
ANION GAP: 6 (ref 5–15)
AST: 53 U/L — AB (ref 15–41)
Alkaline Phosphatase: 219 U/L — ABNORMAL HIGH (ref 38–126)
BUN: 8 mg/dL (ref 6–20)
CO2: 21 mmol/L — ABNORMAL LOW (ref 22–32)
Calcium: 9.2 mg/dL (ref 8.9–10.3)
Chloride: 110 mmol/L (ref 101–111)
Creatinine, Ser: 1.34 mg/dL — ABNORMAL HIGH (ref 0.61–1.24)
GFR calc non Af Amer: 54 mL/min — ABNORMAL LOW (ref >60)
GLUCOSE: 107 mg/dL — AB (ref 70–99)
Potassium: 3.7 mmol/L (ref 3.5–5.1)
SODIUM: 137 mmol/L (ref 135–145)
TOTAL PROTEIN: 6.3 g/dL — AB (ref 6.5–8.1)
Total Bilirubin: 0.5 mg/dL (ref 0.3–1.2)

## 2015-05-01 LAB — CBC WITH DIFFERENTIAL/PLATELET
BASOS ABS: 0 10*3/uL (ref 0.0–0.1)
Basophils Relative: 0 % (ref 0–1)
Eosinophils Absolute: 0.4 10*3/uL (ref 0.0–0.7)
Eosinophils Relative: 5 % (ref 0–5)
HCT: 42.1 % (ref 39.0–52.0)
Hemoglobin: 13.8 g/dL (ref 13.0–17.0)
Lymphocytes Relative: 11 % — ABNORMAL LOW (ref 12–46)
Lymphs Abs: 0.8 10*3/uL (ref 0.7–4.0)
MCH: 32.8 pg (ref 26.0–34.0)
MCHC: 32.8 g/dL (ref 30.0–36.0)
MCV: 100 fL (ref 78.0–100.0)
MONO ABS: 1.2 10*3/uL — AB (ref 0.1–1.0)
Monocytes Relative: 16 % — ABNORMAL HIGH (ref 3–12)
Neutro Abs: 5 10*3/uL (ref 1.7–7.7)
Neutrophils Relative %: 68 % (ref 43–77)
PLATELETS: 201 10*3/uL (ref 150–400)
RBC: 4.21 MIL/uL — AB (ref 4.22–5.81)
RDW: 13.2 % (ref 11.5–15.5)
WBC: 7.4 10*3/uL (ref 4.0–10.5)

## 2015-05-01 LAB — PROTIME-INR
INR: 1.03 (ref 0.00–1.49)
Prothrombin Time: 13.6 seconds (ref 11.6–15.2)

## 2015-05-01 MED ORDER — ASPIRIN 81 MG PO CHEW: 81.0000 mg | CHEWABLE_TABLET | Freq: Every day | ORAL | Status: AC

## 2015-05-01 NOTE — Progress Notes (Signed)
Pt discharged to [facility] post radiation. Report call to [facility] and [facility]. Pt waiting for transportation

## 2015-05-01 NOTE — Progress Notes (Signed)
Patient is set to discharge back to [facility] today. Patient & wife, Nelie at bedside aware. Discharge packet given to RN, Ene. [facility] called for transport to pickup at 4:00pm.    Raynaldo Opitz, [facility] Worker cell #: [phone]

## 2015-05-01 NOTE — Progress Notes (Signed)
Subjective: No chest pain, no SOB  Objective: Vital signs in last 24 hours: Temp:  [97.6 F (36.4 C)-97.8 F (36.6 C)] 97.6 F (36.4 C) (05/04 0512) Pulse Rate:  [67-138] 94 (05/04 0512) Resp:  [0-29] 18 (05/04 0512) BP: (106-155)/(42-75) 113/60 mmHg (05/04 0512) SpO2:  [94 %-100 %] 95 % (05/04 0512) Weight change:  Last BM Date: 04/29/15 Intake/Output from previous day: -385 05/03 0701 - 05/04 0700 In: 240 [P.O.:240] Out: 1225 [Urine:1225] Intake/Output this shift:    PE: General:Pleasant affect, NAD Skin:Warm and dry, brisk capillary refill HEENT:normocephalic, sclera clear, mucus membranes moist Heart:S1S2 RRR without murmur, gallup, rub or click Lungs:clear without rales, rhonchi, or wheezes JSH:FWYO, non tender, + BS, do not palpate liver spleen or masses Ext:no lower ext edema,2+ radial pulses- rt radial cath site stable.  Neuro:alert and oriented, MAE, follows commands, + facial symmetry  Tele:  No on   Lab Results:  Recent Labs  04/29/15 0427 05/01/15 0427  WBC 7.8 7.4  HGB 12.1* 13.8  HCT 36.3* 42.1  PLT 165 201   BMET  Recent Labs  04/30/15 0605 05/01/15 0427  NA 136 137  K 3.0* 3.7  CL 112* 110  CO2 20* 21*  GLUCOSE 106* 107*  BUN 9 8  CREATININE 1.32* 1.34*  CALCIUM 9.1 9.2     Hepatic Function Panel  Recent Labs  05/01/15 0427  PROT 6.3*  ALBUMIN 3.4*  AST 53*  ALT 98*  ALKPHOS 219*  BILITOT 0.5   No results for input(s): CHOL in the last 72 hours. No results for input(s): PROTIME in the last 72 hours.    CARDIAC CATH: Studies/Results: Dominance: Right   Left Main   . Ost LM to LM lesion, 30% stenosed. diffuse .     Left Anterior Descending  There is milddiffuse disease throughout the vessel. calcification     Right Coronary Artery   . Prox RCA lesion, 30% stenosed. discrete .        Prox RCA lesion, 30% stenosed.  Ost LM to LM lesion, 30% stenosed.  1. Nonobstructive CAD 2. Normal  LVEDP  Recommendations: Medical management.     Medications: I have reviewed the patient's current medications. Scheduled Meds: . acyclovir  400 mg Oral BID  . aspirin  81 mg Oral Daily  . atorvastatin  40 mg Oral q1800  . clopidogrel  75 mg Oral Daily  . [START ON 05/03/2015] dexamethasone  40 mg Oral Weekly  . famotidine  20 mg Oral BID  . feeding supplement (ENSURE ENLIVE)  237 mL Oral BID BM  . metoprolol succinate  12.5 mg Oral Daily  . potassium chloride  40 mEq Oral TID  . senna-docusate  2 tablet Oral BID  . sodium chloride  3 mL Intravenous Q12H   Continuous Infusions:  PRN Meds:.sodium chloride, acetaminophen, bisacodyl, sodium chloride, sorbitol  Assessment/Plan: 1. Acute coronary syndrome with mild ST elevations: He has a markedly abnormal ECG and elevated troponins which are trending down (peaked at 3.85 on 4/29, down to 2.35 on 4/30) and an ischemic cardiomyopathy, EF 35-40%, with large apical aneurysm. He is already on ASA and heparin. Cardiac cath with non obstructive disease   On metoprolol tartrate to succinate 12.5 mg daily and continue Lipitor 40 mg daily. At one point recommended would discharge on Plavix but with nonobstructive CAD may not need will check with Dr. Percival Spanish.  CEI due to CKD and hypotension, though BP overall  improved.   2. Essential HTN: BP labile. Will monitor given switch to metoprolol succinate.  3. Hyperlipidemia: Will continue high intensity statin therapy with Lipitor 40 mg daily.  4. 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  - for radiation today- unsure he can hold himself up with radial cath yesterday.-- discussed with Dr. Martinique, ok to do procedure but monitor wrist.  5. CKD -stage 3, stable post cath though may increase by tomorrow  --may be transferring to [facility] today    LOS: 5 days   Time spent with pt. :15 minutes. [facility]  Nurse Practitioner Certified Pager  [phone] or after 5pm and on weekends call [phone] 05/01/2015, 10:42 AM   History and all data above reviewed.  Patient examined.  I agree with the findings as above.  No cardiac complaints.  No SOB.  No chest pain  The patient exam reveals COR:RRR  ,  Lungs: Clear  ,  Abd: Positive bowel sounds, no rebound no guarding, Ext Right wrist OK  .  All available labs, radiology testing, previous records reviewed. Agree with documented assessment and plan. Non ischemic CM, Takatsubo.  Hold off on starting ACE, ARB with recent CKD.  However, this could likely be started as an out patient.  I talked to him about low salt.  He does not need Plavix.  I will get him, a two week follow up.   Jeneen Rinks Kiam Bransfield  12:08 PM  05/01/2015

## 2015-05-01 NOTE — Discharge Summary (Addendum)
Physician Discharge Summary  Eric Dudley DXA:[id number] DOB: 1951/06/14 DOA: 04/26/2015  PCP: [name], MD  Admit date: 04/26/2015 Discharge date: 05/01/2015  Time spent: > 30 minutes  Recommendations for Outpatient Follow-up:  1. Follow up with Dr. Isidore Moos for continuing treatments 2. Follow up with Dr. Burr Medico as scheduled 3. Follow up with Dr. Ashby Dawes in 1-2 weeks 4. Please obtain BMP in 3-4 days  Discharge Diagnoses:  Principal Problem:   Syncope and collapse Active Problems:   HTN (hypertension)   Hyperlipidemia   Tonsillar cancer   CKD (chronic kidney disease), stage II   Acute back pain   AKI (acute kidney injury)   Syncope   ST elevation myocardial infarction involving left anterior descending (LAD) coronary artery   Cardiomyopathy, nonischemic  Discharge Condition: stable  Diet recommendation: heart healthy  Filed Weights   04/26/15 2252 04/29/15 1406  Weight: 87.408 kg (192 lb 11.2 oz) 85.73 kg (189 lb)   History of present illness:  Per Dr. Verlon Au 64 y/o recently admitted by this physician 4/19 for acute back pain where he was found to have a pathological T12 fracture IR consulted and recommended kyphoplasty which was performed on 04/19/15. 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 Subsequent to this he had an episode wherein he had severe pain and became unresponsive for about 20 seconds. He states that he could hear what the nurses were saying at the time and he could understand what was going on but could not respond and felt uncomfortable and out of it. He however spontaneously woke subsequent to this. It was noted that his O2 sats were in the low  90s which is not usual for him and he does not have a typical oxygen requirement. He was placed on 2 L of nasal cannula At the time - Chest pain, - cough, - fever, - chills, - blurred vision, - double vision, - unilateral weakness, - fall, - sore throat, - melena however has had little bit of stool that is tinged with blood after wiping He is completely back to his baseline when i assessed him in the radiation oncology suite  Hospital Course:  ACS, ST Elevation MI - cardiology consulted and have followed patient while hospitalized. Patient was initially found to have troponin elevation and anterior apical ST elevation MI and resultant cardiomyopathy. He underwent a cardiac catheterization on 5/3 which showed mild diffuse disease, no stents were placed. He is on aspirin, per cardiology he does not need Plavix.  Metabolic encephalopathy 2/2 to opiates - his confusion was transient overnight 4/30 and opiates d/c'd-resolved Multiple myeloma with complication of lytic lesion T11-status post kyphoplasty 4/22-management as per radiation oncology. Continue suppressive acyclovir 400 twice a day, continue Decadron 40 mg with Velcade. Ongoing outpatient management.  HTN (hypertension) - history of--he was discontinued off of his ARB on discharge 4/26 secondary to chronic kidney disease. Continue Toprol 12.5 XL Hyperlipidemia - resume home medications on d/c Tonsillar cancer - history of CKD (chronic kidney disease), stage II - creatinine stable on discharge. Recommend BMP check in 3-5 days.  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 Patient to  continue potassium  Hyponatremia - resolved with fluids.  Procedures:  Cardiac cath 5/3 Dominance: Right   Left Main   . Ost LM to LM lesion, 30% stenosed. diffuse .     Left Anterior Descending  There is milddiffuse disease throughout the vessel. calcification     Right Coronary Artery   . Prox RCA lesion,  30% stenosed. discrete .       Consultations:  Cardiology   Radiation oncology   Discharge Exam: Filed Vitals:   04/30/15 1510 04/30/15 2140 05/01/15 0512 05/01/15 1408  BP: 128/56 155/66 113/60 105/57  Pulse: 72 73 94 76  Temp:  97.8 F (36.6 C) 97.6 F (36.4 C) 98 F (36.7 C)  TempSrc:  Axillary Oral Oral  Resp: _0 Height:      Weight:      SpO2: 97% 99% 95% 96%    General: NAD Cardiovascular: RRR Respiratory: CTA biL  Discharge Instructions     Medication List    STOP taking these medications        OxyCODONE 15 mg T12a 12 hr tablet  Commonly known as:  OXYCONTIN     Oxycodone HCl 10 MG Tabs     PRESCRIPTION MEDICATION      TAKE these medications        acetaminophen 325 MG tablet  Commonly known as:  TYLENOL  Take 2 tablets (650 mg total) by mouth every 6 (six) hours as needed for mild pain, fever or headache.     acyclovir 400 MG tablet  Commonly known as:  ZOVIRAX  Take 1 tablet (400 mg total) by mouth 2 (two) times daily.     aspirin 81 MG chewable tablet  Chew 1 tablet (81 mg total) by mouth daily.     CENTRUM SILVER ULTRA MENS PO  Take 1 tablet by mouth daily.     cyclobenzaprine 5 MG tablet  Commonly known as:  FLEXERIL  Take 1 tablet (5 mg total) by mouth 3 (three) times daily.     dexamethasone 4 MG tablet  Commonly known as:  DECADRON  Take 10 tablets (40 mg total) by mouth once a week. Take 40 mg ( 10 tablets )  Once  A  Week  On  Day  Of  Velcade.     DULCOLAX PO  Take 3 capsules by mouth daily as needed (for constipation).     ezetimibe-simvastatin 10-40 MG per tablet  Commonly known as:  VYTORIN  Take 1 tablet by mouth at bedtime.     lenalidomide 10 MG capsule  Commonly known as:  REVLIMID  Take 1 capsule (10 mg total) by mouth daily. Take for 21 days and rest 7 days. ICD-10 is C90.00.     metoprolol succinate 25 MG 24 hr tablet  Commonly known as:  TOPROL-XL  Take 0.5 tablets (12.5 mg total) by mouth  daily.     ondansetron 8 MG tablet  Commonly known as:  ZOFRAN  Take 1 tablet (8 mg total) by mouth 2 (two) times daily as needed for nausea or vomiting.     polyethylene glycol packet  Commonly known as:  MIRALAX / GLYCOLAX  Take 17 g by mouth 2 (two) times daily.     potassium chloride 20 MEQ/15ML (10%) Soln  Take 30 mLs (40 mEq total) by mouth 3 (three) times daily.     ranitidine 150 MG tablet  Commonly known as:  ZANTAC  Take 300 mg by mouth at  bedtime.     sennosides-docusate sodium 8.6-50 MG tablet  Commonly known as:  SENOKOT-S  Take 2 tablets by mouth 2 (two) times daily.     spironolactone 25 MG tablet  Commonly known as:  ALDACTONE  Take 12.5 mg by mouth daily.       Follow-up Information    Follow up with Pixie Casino, MD On 05/20/2015.   Specialty:  Cardiology   Why:  at 11:15 am    Contact information:   [place] [address] [phone]      The results of significant diagnostics from this hospitalization (including imaging, microbiology, ancillary and laboratory) are listed below for reference.    Significant Diagnostic Studies: Ct Abdomen Pelvis Wo Contrast  04/16/2015   CLINICAL DATA:  Bilateral back pain starting Friday, history of tonsillar cancer 10 years ago  EXAM: CT ABDOMEN AND PELVIS WITHOUT CONTRAST  TECHNIQUE: Multidetector CT imaging of the abdomen and pelvis was performed following the standard protocol without IV contrast.  COMPARISON:  03/19/2015 bone survey  FINDINGS: Lung bases shows bilateral basilar posterior atelectasis. There is significant osteoporosis thoracolumbar spine.  There is interval moderate compression deformity of T12 vertebral body. Subacute compression fracture is suspected. Further correlation with MRI is recommended as clinically warranted.  Axial image 45 there is a lytic lesion L3 vertebral body measures about 1.4 cm. This is best seen on sagittal image 61. Metastatic disease cannot be  excluded. Further correlation with MRI is recommended.  Unenhanced liver shows no biliary ductal dilatation. Small hiatal hernia.  Tiny calcified gallstones are noted within gallbladder the largest measures 3.8 mm. Unenhanced pancreas, spleen and adrenal glands are unremarkable. Unenhanced kidneys are symmetrical in size. No hydronephrosis or hydroureter. Nonobstructive calculus in lower pole of the right kidney measures 4 mm. There is a cortical calcification in lower pole of the left kidney measures 3 mm.  No calcified ureteral calculi are noted bilaterally. Atherosclerotic calcifications of abdominal aorta and iliac arteries.  No small bowel obstruction. Moderate stool noted in right colon and cecum. No pericecal inflammation. The terminal ileum is unremarkable. Moderate distended urinary bladder. Prostate gland and seminal vesicles are unremarkable. Diffuse osteoporosis noted within pelvis and bilateral hips.  IMPRESSION: 1. Diffuse osteoporosis thoracolumbar spine. There is interval moderate compression deformity of T12 vertebral body. Subacute pathologic compression fracture cannot be excluded. There is a lytic lesion L3 vertebral body measures about 1.4 cm. Metastatic disease cannot be excluded. Further correlation with MRI is recommended. 2. Bilateral nonobstructive nephrolithiasis. No hydronephrosis or hydroureter. 3. No calcified ureteral calculi. 4. No pericecal inflammation.  Unremarkable terminal ileum. 5. Small layering gallstones are noted within gallbladder the largest measures 3.8 mm. 6. Moderate distended urinary bladder. No stones are noted within urinary bladder   Electronically Signed   By: Lahoma Crocker M.D.   On: 04/16/2015 10:39   Mr Thoracic Spine Wo Contrast  04/17/2015   ADDENDUM REPORT: 04/17/2015 09:01  ADDENDUM: The MRI thoracic spine Impression should read: Pathologic compression fracture at T12 in this patient with widespread multiple myeloma, not T8. Findings discussed with Dr.  Isidore Moos.   Electronically Signed   By: Rolla Flatten M.D.   On: 04/17/2015 09:01   04/17/2015   CLINICAL DATA:  Patient with multiple myeloma and acute back pain. Symptoms for several weeks, much worse in the past 4 days. Initial encounter.  EXAM: MRI THORACIC AND LUMBAR SPINE WITHOUT CONTRAST  TECHNIQUE: Multiplanar and multiecho pulse sequences of the thoracic  and lumbar spine were obtained without intravenous contrast.  COMPARISON:  CT urogram earlier today.  FINDINGS: MR THORACIC SPINE FINDINGS  Numbering of the thoracic spine was accomplished by counting down from the odontoid. New  Diffuse marrow signal abnormality consistent with myeloma. Minor endplate softening superiorly and inferiorly at T9. There is an acute to subacute compression fracture of T12 vertebral body with moderate BILATERAL endplate can cavity and loss of height estimated 30-50%. Retropulsion of abnormal bone at T12 without conus compression. No other pathologic compression deformities. Focal myeloma deposit midline T9 posteriorly measuring 10 x 10 x 20 mm. No significant mass effect on the spinal cord.  No significant thoracic disc protrusion or spinal stenosis. Mild annular bulging T7-T8. No cord compression at any level. Small layering BILATERAL effusions. Bibasilar atelectasis.  MR LUMBAR SPINE FINDINGS  Diffusely abnormal marrow consistent with the history of myeloma. Focal deposit posterior aspect of L3 on the RIGHT measures 19 x 13 x 15 mm. No pathologic compression deformity. Mild stenosis at L4-5 relates to ordinary spondylosis with annular bulging and facet arthropathy. Focal rightward protrusion at this level narrows the foramen, potentially affecting the L4 nerve root. No obstructive uropathy.  IMPRESSION: MR THORACIC SPINE IMPRESSION  Pathologic compression fracture at T8 in this patient with widespread multiple myeloma. Slight retropulsion of abnormal bone without conus compression.  MR LUMBAR SPINE IMPRESSION  Focal myeloma  deposits at L3 and T9 without significant compression deformity.  Mild stenosis at L4-5 related to ordinary spondylosis.  Electronically Signed: By: Rolla Flatten M.D. On: 04/16/2015 20:14   Mr Lumbar Spine Wo Contrast  04/17/2015   ADDENDUM REPORT: 04/17/2015 09:01  ADDENDUM: The MRI thoracic spine Impression should read: Pathologic compression fracture at T12 in this patient with widespread multiple myeloma, not T8. Findings discussed with Dr. Isidore Moos.   Electronically Signed   By: Rolla Flatten M.D.   On: 04/17/2015 09:01   04/17/2015   CLINICAL DATA:  Patient with multiple myeloma and acute back pain. Symptoms for several weeks, much worse in the past 4 days. Initial encounter.  EXAM: MRI THORACIC AND LUMBAR SPINE WITHOUT CONTRAST  TECHNIQUE: Multiplanar and multiecho pulse sequences of the thoracic and lumbar spine were obtained without intravenous contrast.  COMPARISON:  CT urogram earlier today.  FINDINGS: MR THORACIC SPINE FINDINGS  Numbering of the thoracic spine was accomplished by counting down from the odontoid. New  Diffuse marrow signal abnormality consistent with myeloma. Minor endplate softening superiorly and inferiorly at T9. There is an acute to subacute compression fracture of T12 vertebral body with moderate BILATERAL endplate can cavity and loss of height estimated 30-50%. Retropulsion of abnormal bone at T12 without conus compression. No other pathologic compression deformities. Focal myeloma deposit midline T9 posteriorly measuring 10 x 10 x 20 mm. No significant mass effect on the spinal cord.  No significant thoracic disc protrusion or spinal stenosis. Mild annular bulging T7-T8. No cord compression at any level. Small layering BILATERAL effusions. Bibasilar atelectasis.  MR LUMBAR SPINE FINDINGS  Diffusely abnormal marrow consistent with the history of myeloma. Focal deposit posterior aspect of L3 on the RIGHT measures 19 x 13 x 15 mm. No pathologic compression deformity. Mild stenosis at  L4-5 relates to ordinary spondylosis with annular bulging and facet arthropathy. Focal rightward protrusion at this level narrows the foramen, potentially affecting the L4 nerve root. No obstructive uropathy.  IMPRESSION: MR THORACIC SPINE IMPRESSION  Pathologic compression fracture at T8 in this patient with widespread multiple myeloma. Slight retropulsion  of abnormal bone without conus compression.  MR LUMBAR SPINE IMPRESSION  Focal myeloma deposits at L3 and T9 without significant compression deformity.  Mild stenosis at L4-5 related to ordinary spondylosis.  Electronically Signed: By: Rolla Flatten M.D. On: 04/16/2015 20:14   Dg Chest Port 1 View  04/28/2015   CLINICAL DATA:  Increase chest pain this morning discussion with inspiration and movement. Question of CHF.  EXAM: PORTABLE CHEST - 1 VIEW  COMPARISON:  04/26/2015  FINDINGS: Shallow lung inflation. Bibasilar atelectasis. No evidence for pulmonary edema. Note is made of gaseous distension of the stomach.  IMPRESSION: 1. Bibasilar atelectasis in shallow inflation.   Electronically Signed   By: Nolon Nations M.D.   On: 04/28/2015 11:08   Dg Chest Port 1 View  04/26/2015   CLINICAL DATA:  Hypoxia.  Tonsillar carcinoma.  Multiple myeloma.  EXAM: PORTABLE CHEST - 1 VIEW  COMPARISON:  None.  FINDINGS: The heart size and mediastinal contours are within normal limits. Mild linear opacity in the left lung base may be due to scarring or atelectasis. No evidence of pulmonary airspace disease or pleural effusion.  IMPRESSION: Mild left basilar atelectasis versus scarring.   Electronically Signed   By: Earle Gell M.D.   On: 04/26/2015 19:17   Ir Kypho Vertebral Thoracic Augmentation  04/19/2015   CLINICAL DATA:  T12 pathologic compression fracture  EXAM: T12 KYPHOPLASTY  FLUOROSCOPY TIME:  5 minutes and 5 seconds  MEDICATIONS AND MEDICAL HISTORY: General endotracheal anesthesia was provided  ANESTHESIA/SEDATION: Not applicable  CONTRAST:  None  PROCEDURE:  The procedure, risks, benefits, and alternatives were explained to the patient. Questions regarding the procedure were encouraged and answered. The patient understands and consents to the procedure.  The back was prepped with Betadine in a sterile fashion, and a sterile drape was applied covering the operative field. A sterile gown and sterile gloves were used for the procedure.  Under fluoroscopic guidance, a 8 gauge needle was inserted into the T12 vertebral body via a left transpedicular approach. A drill was advanced to the anterior third of the vertebral body. A 20 mm balloon was inserted and insufflated. The balloon is positioned in the anterior third and at the midline. The cement trocar was inserted and cement was injected. No extra osseous cement was noted. After cement hardening, the patient was transferred to her bed without complication.  FINDINGS: Images demonstrate needle placement in the T12 vertebral body via a left transpedicular approach. T12 balloon kyphoplasty is documented. The final image demonstrates cement filling the vertebral body, crossing the midline, and extending from the inferior to superior end plate. No extra osseous cement.  IMPRESSION: Successful T12 kyphoplasty.   Electronically Signed   By: [name] M.D.   On: 04/19/2015 11:37    Labs: Basic Metabolic Panel:  Recent Labs Lab 04/26/15 1932 04/27/15 0406 04/28/15 0514 04/29/15 0427 04/30/15 0605 05/01/15 0427  NA 124* 130* 132* 134* 136 137  K 3.4* 3.3* 3.7 3.5 3.0* 3.7  CL 96 99 105 106 112* 110  CO2 _0 20* 21*  GLUCOSE 169* 89 110* 111* 106* 107*  BUN _1 CREATININE 1.58* 1.44* 1.79* 1.78* 1.32* 1.34*  CALCIUM 9.6 10.1 9.3 9.1 9.1 9.2   Liver Function Tests:  Recent Labs Lab 04/26/15 1932 04/27/15 0406 04/29/15 0427 05/01/15 0427  AST 59* 48* 43* 53*  ALT 94* 80* 82* 98*  ALKPHOS 226* 203* 183* 219*  BILITOT 1.0  0.7 0.4 0.5  PROT 8.1 7.4 6.1* 6.3*  ALBUMIN 4.1 3.8 3.3*  3.4*   CBC:  Recent Labs Lab 04/26/15 1932 04/27/15 0406 04/28/15 1112 04/29/15 0427 05/01/15 0427  WBC 13.0* 13.6* 9.8 7.8 7.4  NEUTROABS  --   --  7.3 5.2 5.0  HGB 16.5 15.7 13.4 12.1* 13.8  HCT 45.9 44.7 39.3 36.3* 42.1  MCV 95.2 95.1 97.5 97.1 100.0  PLT 268 244 205 165 201   Cardiac Enzymes:  Recent Labs Lab 04/26/15 2330 04/27/15 0406 04/27/15 1159 04/27/15 1610 04/27/15 2204  TROPONINI 3.85* 3.27* 2.96* 2.51* 2.35*    Signed:  GHERGHE, COSTIN  [facility] 05/01/2015, 2:16 PM

## 2015-05-01 NOTE — Telephone Encounter (Signed)
per pof ot sch pt appt-cld & left pt a message of time & date of appt-adv Santiago Glad in [place] to print sch

## 2015-05-02 ENCOUNTER — Ambulatory Visit: Payer: Medicare Other | Admitting: Radiation Oncology

## 2015-05-02 ENCOUNTER — Ambulatory Visit
Admission: RE | Admit: 2015-05-02 | Discharge: 2015-05-02 | Disposition: A | Discharge status: Home / Self Care | Payer: Medicare Other | Source: Ambulatory Visit | Attending: Radiation Oncology | Admitting: Radiation Oncology

## 2015-05-02 ENCOUNTER — Ambulatory Visit: Payer: Medicare Other

## 2015-05-02 DIAGNOSIS — C9000 Multiple myeloma not having achieved remission: Secondary | ICD-10-CM | POA: Diagnosis not present

## 2015-05-02 DIAGNOSIS — Z510 Encounter for antineoplastic radiation therapy: Secondary | ICD-10-CM | POA: Diagnosis not present

## 2015-05-03 ENCOUNTER — Ambulatory Visit
Admission: RE | Admit: 2015-05-03 | Discharge: 2015-05-03 | Disposition: A | Discharge status: Home / Self Care | Payer: Medicare Other | Source: Ambulatory Visit | Attending: Radiation Oncology | Admitting: Radiation Oncology

## 2015-05-03 ENCOUNTER — Telehealth: Payer: Self-pay | Admitting: *Deleted

## 2015-05-03 ENCOUNTER — Telehealth: Payer: Self-pay | Admitting: Hematology

## 2015-05-03 ENCOUNTER — Other Ambulatory Visit: Payer: TRICARE For Life (TFL)

## 2015-05-03 ENCOUNTER — Ambulatory Visit: Payer: Medicare Other

## 2015-05-03 ENCOUNTER — Ambulatory Visit (HOSPITAL_BASED_OUTPATIENT_CLINIC_OR_DEPARTMENT_OTHER): Payer: Medicare Other

## 2015-05-03 ENCOUNTER — Ambulatory Visit (HOSPITAL_BASED_OUTPATIENT_CLINIC_OR_DEPARTMENT_OTHER): Payer: Medicare Other | Admitting: Hematology

## 2015-05-03 VITALS — BP 117/71 | HR 91 | Temp 97.9°F | Resp 18 | Ht 68.0 in | Wt 189.9 lb

## 2015-05-03 DIAGNOSIS — C9000 Multiple myeloma not having achieved remission: Secondary | ICD-10-CM

## 2015-05-03 DIAGNOSIS — Z5112 Encounter for antineoplastic immunotherapy: Secondary | ICD-10-CM

## 2015-05-03 DIAGNOSIS — Z8589 Personal history of malignant neoplasm of other organs and systems: Secondary | ICD-10-CM | POA: Diagnosis not present

## 2015-05-03 DIAGNOSIS — G893 Neoplasm related pain (acute) (chronic): Secondary | ICD-10-CM

## 2015-05-03 DIAGNOSIS — Z510 Encounter for antineoplastic radiation therapy: Secondary | ICD-10-CM | POA: Diagnosis not present

## 2015-05-03 DIAGNOSIS — M8580 Other specified disorders of bone density and structure, unspecified site: Secondary | ICD-10-CM

## 2015-05-03 MED ORDER — BORTEZOMIB CHEMO SQ INJECTION 3.5 MG (2.5MG/ML)
1.3000 mg/m2 | Freq: Once | INTRAMUSCULAR | Status: AC
  Administered 2015-05-03: 2.75 mg via SUBCUTANEOUS
  Filled 2015-05-03: qty 2.75

## 2015-05-03 MED ORDER — ONDANSETRON HCL 8 MG PO TABS
8.0000 mg | ORAL_TABLET | Freq: Once | ORAL | Status: AC
  Administered 2015-05-03: 8 mg via ORAL

## 2015-05-03 MED ORDER — ONDANSETRON HCL 8 MG PO TABS
ORAL_TABLET | ORAL | Status: AC
  Filled 2015-05-03: qty 1

## 2015-05-03 NOTE — Telephone Encounter (Signed)
Gave and printed appt sched and avs fo rpt for May and June...add3ed tx.

## 2015-05-03 NOTE — Telephone Encounter (Signed)
Per Triage RN, Eric Dudley had called with confusion over pt's medication-revlimid.  Verified with Dr Burr Medico that pt is not taking revlimid at this time but to cont dexamethasone weekly on fri & he is getting radiation & velcade injections at the Cancer center.

## 2015-05-03 NOTE — Patient Instructions (Signed)
[facility] Discharge Instructions for Patients Receiving Chemotherapy  Today you received the following chemotherapy agents Velcade  To help prevent nausea and vomiting after your treatment, we encourage you to take your nausea medication Zofran 8 mg twice daily as needed  If you develop nausea and vomiting that is not controlled by your nausea medication, call the clinic.   BELOW ARE SYMPTOMS THAT SHOULD BE REPORTED IMMEDIATELY:  *FEVER GREATER THAN 100.5 F  *CHILLS WITH OR WITHOUT FEVER  NAUSEA AND VOMITING THAT IS NOT CONTROLLED WITH YOUR NAUSEA MEDICATION  *UNUSUAL SHORTNESS OF BREATH  *UNUSUAL BRUISING OR BLEEDING  TENDERNESS IN MOUTH AND THROAT WITH OR WITHOUT PRESENCE OF ULCERS  *URINARY PROBLEMS  *BOWEL PROBLEMS  UNUSUAL RASH Items with * indicate a potential emergency and should be followed up as soon as possible.  Feel free to call the clinic you have any questions or concerns. The clinic phone number is [phone].  Please show the [facility] at check-in to the Emergency Department and triage nurse.

## 2015-05-03 NOTE — Progress Notes (Signed)
[place]  [phone] [phone]  Clinic New Consult Note   Patient Care Team: [name], MD as PCP - General (Internal Medicine) 05/03/2015  CHIEF COMPLAINTS: Follow-up multiple myeloma    Multiple myeloma   01/31/2015 Tumor Marker SPEP showed M-SPIKE 1.7g/dl, Cr 1.5, no anemia, hypercalcemia   03/19/2015 Imaging Bone survey showed no discrete lytic lesion, but there is osteopenia and calvarial heterogeneity.   03/27/2015 Initial Diagnosis Multiple myeloma, stage I   03/27/2015 Bone Marrow Biopsy Bone Marrow, Aspirate,Biopsy: - HYPERCELLULAR BONE MARROW FOR AGE WITH PLASMA CELL NEOPLASM 47% - SEE COMMENT. PERIPHERAL BLOOD: - NO SIGNIFICANT MORPHOLOGIC ABNORMALITIES. Diagnosis Note The bone marrow shows increased number of    03/27/2015 Miscellaneous bone marrow Cytogenetics: normal. FISH panel showed the presence of +4 and +11, this is consider standard risk of MM    04/16/2015 - 04/23/2015 Hospital Admission He was admitted for worsening back pain, was found to have a T12 pathological fracture. He underwent kyphoplasty on 04/19/2015.   04/19/2015 -  Chemotherapy RVD with weekly Velcade 1.3 mg/m, dexamethasone 40 mg, and Revlimid 10 mg daily on day 1-21, every 28 days   04/26/2015 - 05/01/2015 Hospital Admission He was admitted to [facility] for syncope episode during his radiation simulation. EKG showed ST elevation, troponin was positive, he underwent cardio catheterization which showed mild stenosis. No intervention was needed. echo showed EF 35-40%     HISTORY OF PRESENTING ILLNESS:  Eric Dudley 64 y.o. 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  He has been having low back pain for 3-4 months. He had food posinin 4 month ago, and had frequent diarrhea. He started noticed low back pain since then. The pain is not radiating  to leg, he also has intermittent right rib pain, worse with cough and sneezing. He remains to be physically active, able to do all the activities, such as gardening work housework without much limitation, but he doesn't sings slowly because of back pain. He takes Tylenol as needed, does not like the necrotic pain medication. No night sweats, no fever or chillss, no weight loss.   He was seen by his primary care physician Dr. [name], MD and had lab test (see below). He also had bone density scan 2/25 which showed osteoporosis, has not been treated yet.  Current therapy: Weekly Velcade 1.3 mg/m and dexamethasone 40 mg, Revlimid 10 mg daily on day 1-21, every 28 days, started on 04/19/2015, Revlimid was held in the middle of the first cycle due to multiple hospitalization and radiation.   INTERIM HISTORY: Mr. Daughtridge returns for follow-up. He was admitted to [facility] on April 29, due to the syncope episode during his radiation simulation. Further workup revealed ST elevation MI, echocardiogram showed EF of 35-45%, he was seen by cardiologist and underwent cardiac catheterization which showed mild diffuse stenosis, no intervention was needed. 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 His pain has significantly improved, he was discharged back to nursing home 2 days ago. He is taking Tylenol as needed, his pain is mainly located on the bilateral lateral low rib, worse when he changes position. He is able to move around with a walker. No other new complaints. He has started radiation 3 days ago, does have quite significant pain when he gets in and out of the radiation  table.  MEDICAL HISTORY:  Past Medical History  Diagnosis Date  . Hypertension   . Dyslipidemia   . Tonsillar cancer 2006  . Cholelithiases     SURGICAL HISTORY: Past Surgical History  Procedure Laterality Date  . Appendectomy  1962  . Shoulder surgery  1999    left  .  Knee surgery  2012    right  . Cardiac catheterization N/A 04/30/2015    Procedure: Left Heart Cath and Coronary Angiography;  Surgeon: Peter M Martinique, MD;  Location: [facility] INVASIVE CV LAB CUPID;  Service: Cardiovascular;  Laterality: N/A;    SOCIAL HISTORY: History   Social History  . Marital Status: Married    Spouse Name: N/A  . Number of Children: 1   . Years of Education: N/A   Occupational History  . A retired Therapist, occupational    Social History Main Topics  . Smoking status: Former Smoker    Types: Cigars    Quit date: 12/29/2003  . Smokeless tobacco: Former Systems developer    Types: Chew    Quit date: 12/28/1994  . Alcohol Use: Yes     Comment: occasional beer  . Drug Use: No  . Sexual Activity: Not on file   Other Topics Concern  . Not on file   Social History Narrative  . No narrative on file    FAMILY HISTORY: Family History  Problem Relation Age of Onset  . Cancer Mother 39    cervical cancer   . Cancer Father     unknown cancer   . Hypertension Sister   . Cancer Sister     melanoma     ALLERGIES:  is allergic to hydrochlorothiazide; asa; hydrocodone; amoxicillin; and indapamide.  MEDICATIONS:  Current Outpatient Prescriptions  Medication Sig Dispense Refill  . acetaminophen (TYLENOL) 325 MG tablet Take 2 tablets (650 mg total) by mouth every 6 (six) hours as needed for mild pain, fever or headache.    [name] acyclovir (ZOVIRAX) 400 MG tablet Take 1 tablet (400 mg total) by mouth 2 (two) times daily. 180 tablet 1  . aspirin 81 MG chewable tablet Chew 1 tablet (81 mg total) by mouth daily.    . Bisacodyl (DULCOLAX PO) Take 3 capsules by mouth daily as needed (for constipation).     . cyclobenzaprine (FLEXERIL) 5 MG tablet Take 1 tablet (5 mg total) by mouth 3 (three) times daily. 30 tablet 0  . dexamethasone (DECADRON) 4 MG tablet Take 10 tablets (40 mg total) by mouth once a week. Take 40 mg ( 10 tablets )  Once  A  Week  On  Day  Of  Velcade. 120 tablet 1  .  ezetimibe-simvastatin (VYTORIN) 10-40 MG per tablet Take 1 tablet by mouth at bedtime.    [name] lenalidomide (REVLIMID) 10 MG capsule Take 1 capsule (10 mg total) by mouth daily. Take for 21 days and rest 7 days. ICD-10 is C90.00. 21 capsule 0  . metoprolol succinate (TOPROL-XL) 25 MG 24 hr tablet Take 0.5 tablets (12.5 mg total) by mouth daily. 30 tablet 0  . Multiple Vitamins-Minerals (CENTRUM SILVER ULTRA MENS PO) Take 1 tablet by mouth daily.    . ondansetron (ZOFRAN) 8 MG tablet Take 1 tablet (8 mg total) by mouth 2 (two) times daily as needed for nausea or vomiting. 60 tablet 1  . polyethylene glycol (MIRALAX / GLYCOLAX) packet Take 17 g by mouth 2 (two) times daily.    . potassium chloride 20 MEQ/15ML (10%) SOLN Take  30 mLs (40 mEq total) by mouth 3 (three) times daily. 450 mL 0  . ranitidine (ZANTAC) 150 MG tablet Take 300 mg by mouth at bedtime.     . sennosides-docusate sodium (SENOKOT-S) 8.6-50 MG tablet Take 2 tablets by mouth 2 (two) times daily.    [name] spironolactone (ALDACTONE) 25 MG tablet Take 12.5 mg by mouth daily.     No current facility-administered medications for this visit.    REVIEW OF SYSTEMS:   Constitutional: Denies fevers, chills or abnormal night sweats Eyes: Denies blurriness of vision, double vision or watery eyes Ears, nose, mouth, throat, and face: Denies mucositis or sore throat Respiratory: Denies cough, dyspnea or wheezes Cardiovascular: Denies palpitation, chest discomfort or lower extremity swelling Gastrointestinal:  Denies nausea, heartburn or change in bowel habits Skin: Denies abnormal skin rashes Lymphatics: Denies new lymphadenopathy or easy bruising Neurological:Denies numbness, tingling or new weaknesses Behavioral/Psych: Mood is stable, no new changes  Musculoskeletal: Positive for back pain and intermittent rib pain All other systems were reviewed with the patient and are negative.  PHYSICAL EXAMINATION: ECOG PERFORMANCE STATUS: 1 - Symptomatic  but completely ambulatory Blood pressure 130/55, heart rate 71, respiratory rate 18, temperature 97.8, saturation 100% on room air GENERAL: Today on the table, drowsy, arousable, no distress and comfortable SKIN: skin color, texture, turgor are normal, no rashes or significant lesions EYES: normal, conjunctiva are pink and non-injected, sclera clear OROPHARYNX:no exudate, no erythema and lips, buccal mucosa, and tongue normal  NECK: supple, thyroid normal size, non-tender, without nodularity LYMPH:  no palpable lymphadenopathy in the cervical, axillary or inguinal LUNGS: clear to auscultation and percussion with normal breathing effort HEART: regular rate & rhythm and no murmurs and no lower extremity edema ABDOMEN:abdomen soft, non-tender and normal bowel sounds Musculoskeletal:no cyanosis of digits and no clubbing  PSYCH: alert & oriented x 3 with fluent speech NEURO: no focal motor/sensory deficits  LABORATORY DATA:   CBC Latest Ref Rng 05/01/2015 04/29/2015 04/28/2015  WBC 4.0 - 10.5 K/uL 7.4 7.8 9.8  Hemoglobin 13.0 - 17.0 g/dL 13.8 12.1(L) 13.4  Hematocrit 39.0 - 52.0 % 42.1 36.3(L) 39.3  Platelets 150 - 400 K/uL 201 165 205    CMP Latest Ref Rng 05/01/2015 04/30/2015 04/29/2015  Glucose 70 - 99 mg/dL 107(H) 106(H) 111(H)  BUN 6 - 20 mg/dL '8 9 14  ' Creatinine 0.61 - 1.24 mg/dL 1.34(H) 1.32(H) 1.78(H)  Sodium 135 - 145 mmol/L 137 136 134(L)  Potassium 3.5 - 5.1 mmol/L 3.7 3.0(L) 3.5  Chloride 101 - 111 mmol/L 110 112(H) 106  CO2 22 - 32 mmol/L 21(L) 20(L) 23  Calcium 8.9 - 10.3 mg/dL 9.2 9.1 9.1  Total Protein 6.5 - 8.1 g/dL 6.3(L) - 6.1(L)  Total Bilirubin 0.3 - 1.2 mg/dL 0.5 - 0.4  Alkaline Phos 38 - 126 U/L 219(H) - 183(H)  AST 15 - 41 U/L 53(H) - 43(H)  ALT 17 - 63 U/L 98(H) - 82(H)    PATHOLOGY REPORT Bone Marrow, Aspirate,Biopsy, and Clot, right iliac 03/27/2015 - HYPERCELLULAR BONE MARROW FOR AGE WITH PLASMA CELL NEOPLASM. - SEE COMMENT. PERIPHERAL BLOOD: - NO SIGNIFICANT  MORPHOLOGIC ABNORMALITIES. Diagnosis Note The bone marrow shows increased number of atypical plasma cells representing 47% of all cells in the aspirate associated with prominent interstitial infiltrates and variably sized aggregates in the clot and biopsy sections. Immunohistochemical stains show that the plasma cells are kappa light chain restricted consistent with plasma cell neoplasm. The background shows trilineage  Cytogenetics  RADIOGRAPHIC STUDIES: Bone  survey 03/19/2015 IMPRESSION: No discrete lytic lesion, but there is osteopenia and calvarial heterogeneity -nonspecific findings which can be manifestations of Myeloma  ASSESSMENT & PLAN:  64 year old Caucasian male with past medical history of hypertension, tonsil cancer status post surgery and radiation 10 years ago, now presented with 3-4 months low back pain and intermittent rib pain. His blood test showed M protein 1.7g/dl.  1. IgG multiple myeloma, stage I, standard risk by cytogenetics -I discussed his bone marrow biopsy results with him. He has significant amount of plasma cells in the bone marrow 47%.  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 Based on the new notable myeloma diagnostic criteria, he meets the criteria of multiple myeloma. He has normal abdomen and a mildly elevated Michael clubbing, this is stage I. -His cytogenetics and Fish panel revealed standard risk. -His thoracic and lumbar spine MRI showed pathologic compression fracture at T12, focal myeloma deposits at L3 and T9, diffuse marrow signal abnormality consistent with myeloma -I recommended induction chemotherapy with VRD regimen, weekly Velcade and dexamethasone, the Revlimid dose will be 10 mg daily 3  week on, one-week off, based on his renal function. -The goal of treatment is disease control, potential cure with bone marrow transplant -He is a candidate for bone marrow transplant. I'll refer him to [facility] for bone marrow transplant evaluation -He is currently on palliative radiation. Although we don't have adequate data about Velcade with concurrent radiation in multiple myeloma, this was tested in other solid tumors (lung and heck neck cancer) in phase I studies, which showed to be safe. Given he just started systemic therapy, and radiation is low dose and only 2 weeks, I'll continue his weekly Velcade and dexamethasone. -Continue week 4 treatment, his Revlimid has been held in the mid of first cycle due to multiple hospitalization, I will continue to holding it during his radiation.  -His PET scan has been canceled due to his hospitalization. We'll reschedule.   2. T12 pathologic compression fracture, and diffuse myeloma involvement in bones -Status post kyphoplasty -He has started palliative Radiation for pain control, a total of 10 fractions  -I will give him monthly pamidronate after he finishes radiation   3. Back and rib pain, secondary to multiple myeloma bone lesions  -He could not tolerate narcotics -Continue Tylenol as needed  4 nonobstructive CAD, STEMI, Non ischemic CM, Takatsubo, with EF of 35-40% -Patient was initially found to have troponin elevation and anterior apical ST elevation MI and resultant cardiomyopathy on 04/26/15 -He underwent a cardiac catheterization on 5/3 which showed mild diffuse disease, no stents were placed. He is on aspirin -follow up with Dr. Percival Spanish   Follow up: in one week with lab and Velcade injection   All questions were answered. The patient knows to call the clinic with any problems, questions or concerns. I spent 30 minutes counseling the patient face to face. The total time spent in the appointment was 40 minutes  and more than 50% was on counseling.     Truitt Merle, MD 05/03/2015  1:25 PM

## 2015-05-04 ENCOUNTER — Encounter: Payer: Self-pay | Admitting: Hematology

## 2015-05-05 DIAGNOSIS — I2510 Atherosclerotic heart disease of native coronary artery without angina pectoris: Secondary | ICD-10-CM | POA: Diagnosis not present

## 2015-05-05 DIAGNOSIS — I10 Essential (primary) hypertension: Secondary | ICD-10-CM | POA: Diagnosis not present

## 2015-05-05 DIAGNOSIS — S22080G Wedge compression fracture of T11-T12 vertebra, subsequent encounter for fracture with delayed healing: Secondary | ICD-10-CM | POA: Diagnosis not present

## 2015-05-05 DIAGNOSIS — E785 Hyperlipidemia, unspecified: Secondary | ICD-10-CM | POA: Diagnosis not present

## 2015-05-05 DIAGNOSIS — C9000 Multiple myeloma not having achieved remission: Secondary | ICD-10-CM | POA: Diagnosis not present

## 2015-05-05 DIAGNOSIS — R2689 Other abnormalities of gait and mobility: Secondary | ICD-10-CM | POA: Diagnosis not present

## 2015-05-05 DIAGNOSIS — E871 Hypo-osmolality and hyponatremia: Secondary | ICD-10-CM | POA: Diagnosis not present

## 2015-05-06 ENCOUNTER — Ambulatory Visit
Admission: RE | Admit: 2015-05-06 | Discharge: 2015-05-06 | Disposition: A | Discharge status: Home / Self Care | Payer: Medicare Other | Source: Ambulatory Visit | Attending: Radiation Oncology | Admitting: Radiation Oncology

## 2015-05-06 VITALS — BP 116/63 | HR 69 | Temp 97.9°F | Resp 12 | Wt 189.0 lb

## 2015-05-06 DIAGNOSIS — C9000 Multiple myeloma not having achieved remission: Secondary | ICD-10-CM | POA: Diagnosis not present

## 2015-05-06 DIAGNOSIS — Z510 Encounter for antineoplastic radiation therapy: Secondary | ICD-10-CM | POA: Diagnosis not present

## 2015-05-06 MED ORDER — RADIAPLEXRX EX GEL
Freq: Once | CUTANEOUS | Status: AC
  Administered 2015-05-06: 14:00:00 via TOPICAL

## 2015-05-06 NOTE — Progress Notes (Signed)
   Weekly Management Note: Outpatient  Multiple myeloma C90.00  Current Dose: 12 Gy  Projected Dose: 20 Gy   Narrative:  The patient presents for routine under treatment assessment.  CBCT/MVCT images/Port film x-rays were reviewed.  The chart was checked. Back pain much better, he is able to walk.  No esophagitis  Physical Findings:  Wt Readings from Last 3 Encounters:  05/06/15 189 lb (85.73 kg)  05/03/15 189 lb 14.4 oz (86.138 kg)  04/29/15 189 lb (85.73 kg)    weight is 189 lb (85.73 kg). His oral temperature is 97.9 F (36.6 C). His blood pressure is 116/63 and his pulse is 69. His respiration is 12 and oxygen saturation is 100%.  NAD, in [facility]  CBC    Component Value Date/Time   WBC 7.4 05/01/2015 0427   WBC 7.5 04/12/2015 0958   RBC 4.21* 05/01/2015 0427   RBC 4.40 04/12/2015 0958   HGB 13.8 05/01/2015 0427   HGB 14.9 04/12/2015 0958   HCT 42.1 05/01/2015 0427   HCT 42.3 04/12/2015 0958   PLT 201 05/01/2015 0427   PLT 293 04/12/2015 0958   MCV 100.0 05/01/2015 0427   MCV 96.1 04/12/2015 0958   MCH 32.8 05/01/2015 0427   MCH 33.9* 04/12/2015 0958   MCHC 32.8 05/01/2015 0427   MCHC 35.2 04/12/2015 0958   RDW 13.2 05/01/2015 0427   RDW 13.1 04/12/2015 0958   LYMPHSABS 0.8 05/01/2015 0427   LYMPHSABS 1.1 04/12/2015 0958   MONOABS 1.2* 05/01/2015 0427   MONOABS 0.6 04/12/2015 0958   EOSABS 0.4 05/01/2015 0427   EOSABS 0.0 04/12/2015 0958   BASOSABS 0.0 05/01/2015 0427   BASOSABS 0.0 04/12/2015 0958     CMP     Component Value Date/Time   NA 137 05/01/2015 0427   NA 137 04/09/2015 1038   K 3.7 05/01/2015 0427   K 3.3* 04/09/2015 1038   CL 110 05/01/2015 0427   CO2 21* 05/01/2015 0427   CO2 19* 04/09/2015 1038   GLUCOSE 107* 05/01/2015 0427   GLUCOSE 91 04/09/2015 1038   BUN 8 05/01/2015 0427   BUN 10.7 04/09/2015 1038   CREATININE 1.34* 05/01/2015 0427   CREATININE 1.6* 04/09/2015 1038   CALCIUM 9.2 05/01/2015 0427   CALCIUM 9.6 04/09/2015 1038   PROT  6.3* 05/01/2015 0427   PROT 8.9* 04/09/2015 1038   ALBUMIN 3.4* 05/01/2015 0427   ALBUMIN 4.3 04/09/2015 1038   AST 53* 05/01/2015 0427   AST 28 04/09/2015 1038   ALT 98* 05/01/2015 0427   ALT 41 04/09/2015 1038   ALKPHOS 219* 05/01/2015 0427   ALKPHOS 188* 04/09/2015 1038   BILITOT 0.5 05/01/2015 0427   BILITOT 0.49 04/09/2015 1038   GFRNONAA 54* 05/01/2015 0427   GFRAA >60 05/01/2015 0427     Impression:  The patient is tolerating radiotherapy.   Plan:  Continue radiotherapy as planned. F/u 1 mo after RT. -----------------------------------  Eppie Gibson, MD

## 2015-05-06 NOTE — Progress Notes (Signed)
Pt here for patient teaching.  Pt given Radiation and You booklet and skin care instructions. Reviewed areas of pertinence such as fatigue, skin changes and urinary and bladder changes . Pt able to give teach back of to pat skin and use unscented/gentle soap,apply Radiaplex bid and avoid applying anything to skin within 4 hours of treatment. Pt demonstrated understanding of information given and will contact nursing with any questions or concerns.

## 2015-05-06 NOTE — Addendum Note (Signed)
Encounter addended by: Jenene Slicker, RN on: 05/06/2015  1:45 PM<BR>     Documentation filed: Dx [employer], Inpatient MAR, Orders

## 2015-05-06 NOTE — Addendum Note (Signed)
Encounter addended by: Jenene Slicker, RN on: 05/06/2015 12:14 PM<BR>     Documentation filed: Notes Section

## 2015-05-06 NOTE — Progress Notes (Signed)
He rates his pain as a 3 on a scale of 0-10. constant and "pulling" over right side ribs. Pt complains of loss of sleep, uncomfortable bed. Denies urinary or bowel changes.  Pt denies dysphagia, nausea or vomiting. Denies respiratory abnormalities.  Noted slight erythema - back  BP 116/63 mmHg  Pulse 69  Temp(Src) 97.9 F (36.6 C) (Oral)  Resp 12  Wt 189 lb (85.73 kg)  SpO2 100%

## 2015-05-07 ENCOUNTER — Ambulatory Visit
Admission: RE | Admit: 2015-05-07 | Discharge: 2015-05-07 | Disposition: A | Discharge status: Home / Self Care | Payer: Medicare Other | Source: Ambulatory Visit | Attending: Radiation Oncology | Admitting: Radiation Oncology

## 2015-05-07 DIAGNOSIS — Z510 Encounter for antineoplastic radiation therapy: Secondary | ICD-10-CM | POA: Diagnosis not present

## 2015-05-07 DIAGNOSIS — C9000 Multiple myeloma not having achieved remission: Secondary | ICD-10-CM | POA: Diagnosis not present

## 2015-05-08 ENCOUNTER — Ambulatory Visit
Admission: RE | Admit: 2015-05-08 | Discharge: 2015-05-08 | Disposition: A | Discharge status: Home / Self Care | Payer: Medicare Other | Source: Ambulatory Visit | Attending: Radiation Oncology | Admitting: Radiation Oncology

## 2015-05-08 DIAGNOSIS — Z510 Encounter for antineoplastic radiation therapy: Secondary | ICD-10-CM | POA: Diagnosis not present

## 2015-05-08 DIAGNOSIS — C9000 Multiple myeloma not having achieved remission: Secondary | ICD-10-CM | POA: Diagnosis not present

## 2015-05-09 ENCOUNTER — Ambulatory Visit
Admission: RE | Admit: 2015-05-09 | Discharge: 2015-05-09 | Disposition: A | Discharge status: Home / Self Care | Payer: Medicare Other | Source: Ambulatory Visit | Attending: Radiation Oncology | Admitting: Radiation Oncology

## 2015-05-09 DIAGNOSIS — Z510 Encounter for antineoplastic radiation therapy: Secondary | ICD-10-CM | POA: Diagnosis not present

## 2015-05-09 DIAGNOSIS — C9000 Multiple myeloma not having achieved remission: Secondary | ICD-10-CM | POA: Diagnosis not present

## 2015-05-10 ENCOUNTER — Telehealth: Payer: Self-pay | Admitting: Hematology

## 2015-05-10 ENCOUNTER — Ambulatory Visit (HOSPITAL_BASED_OUTPATIENT_CLINIC_OR_DEPARTMENT_OTHER): Payer: Medicare Other | Admitting: Physician Assistant

## 2015-05-10 ENCOUNTER — Encounter: Payer: Self-pay | Admitting: Radiation Oncology

## 2015-05-10 ENCOUNTER — Other Ambulatory Visit (HOSPITAL_BASED_OUTPATIENT_CLINIC_OR_DEPARTMENT_OTHER): Payer: Medicare Other

## 2015-05-10 ENCOUNTER — Encounter: Payer: Self-pay | Admitting: Physician Assistant

## 2015-05-10 ENCOUNTER — Ambulatory Visit: Payer: Medicare Other

## 2015-05-10 ENCOUNTER — Ambulatory Visit (HOSPITAL_BASED_OUTPATIENT_CLINIC_OR_DEPARTMENT_OTHER): Payer: Medicare Other

## 2015-05-10 ENCOUNTER — Telehealth: Payer: Self-pay | Admitting: Internal Medicine

## 2015-05-10 ENCOUNTER — Encounter: Payer: Self-pay | Admitting: *Deleted

## 2015-05-10 ENCOUNTER — Other Ambulatory Visit: Payer: Self-pay | Admitting: *Deleted

## 2015-05-10 ENCOUNTER — Ambulatory Visit
Admission: RE | Admit: 2015-05-10 | Discharge: 2015-05-10 | Disposition: A | Discharge status: Home / Self Care | Payer: Medicare Other | Source: Ambulatory Visit | Attending: Radiation Oncology | Admitting: Radiation Oncology

## 2015-05-10 VITALS — BP 109/60 | HR 74 | Temp 97.8°F | Resp 18 | Ht 68.0 in | Wt 188.0 lb

## 2015-05-10 DIAGNOSIS — C9000 Multiple myeloma not having achieved remission: Secondary | ICD-10-CM

## 2015-05-10 DIAGNOSIS — Z5112 Encounter for antineoplastic immunotherapy: Secondary | ICD-10-CM | POA: Diagnosis present

## 2015-05-10 DIAGNOSIS — Z8589 Personal history of malignant neoplasm of other organs and systems: Secondary | ICD-10-CM | POA: Diagnosis not present

## 2015-05-10 DIAGNOSIS — M8580 Other specified disorders of bone density and structure, unspecified site: Secondary | ICD-10-CM

## 2015-05-10 DIAGNOSIS — Z510 Encounter for antineoplastic radiation therapy: Secondary | ICD-10-CM | POA: Diagnosis not present

## 2015-05-10 DIAGNOSIS — G893 Neoplasm related pain (acute) (chronic): Secondary | ICD-10-CM

## 2015-05-10 LAB — CBC & DIFF AND RETIC
BASO%: 0.2 % (ref 0.0–2.0)
Basophils Absolute: 0 10*3/uL (ref 0.0–0.1)
EOS ABS: 0 10*3/uL (ref 0.0–0.5)
EOS%: 0 % (ref 0.0–7.0)
HEMATOCRIT: 42.1 % (ref 38.4–49.9)
HEMOGLOBIN: 14.6 g/dL (ref 13.0–17.1)
Immature Retic Fract: 8.4 % (ref 3.00–10.60)
LYMPH%: 4.2 % — ABNORMAL LOW (ref 14.0–49.0)
MCH: 33.6 pg — ABNORMAL HIGH (ref 27.2–33.4)
MCHC: 34.7 g/dL (ref 32.0–36.0)
MCV: 97 fL (ref 79.3–98.0)
MONO#: 0.3 10*3/uL (ref 0.1–0.9)
MONO%: 5.6 % (ref 0.0–14.0)
NEUT#: 4.9 10*3/uL (ref 1.5–6.5)
NEUT%: 90 % — AB (ref 39.0–75.0)
Platelets: 286 10*3/uL (ref 140–400)
RBC: 4.34 10*6/uL (ref 4.20–5.82)
RDW: 13.4 % (ref 11.0–14.6)
RETIC CT ABS: 108.07 10*3/uL — AB (ref 34.80–93.90)
Retic %: 2.49 % — ABNORMAL HIGH (ref 0.80–1.80)
WBC: 5.5 10*3/uL (ref 4.0–10.3)
lymph#: 0.2 10*3/uL — ABNORMAL LOW (ref 0.9–3.3)

## 2015-05-10 LAB — COMPREHENSIVE METABOLIC PANEL (CC13)
ALT: 58 U/L — ABNORMAL HIGH (ref 0–55)
AST: 35 U/L — ABNORMAL HIGH (ref 5–34)
Albumin: 3.6 g/dL (ref 3.5–5.0)
Alkaline Phosphatase: 451 U/L — ABNORMAL HIGH (ref 40–150)
Anion Gap: 11 mEq/L (ref 3–11)
BILIRUBIN TOTAL: 0.43 mg/dL (ref 0.20–1.20)
BUN: 8.6 mg/dL (ref 7.0–26.0)
CO2: 17 mEq/L — ABNORMAL LOW (ref 22–29)
CREATININE: 1.5 mg/dL — AB (ref 0.7–1.3)
Calcium: 8.7 mg/dL (ref 8.4–10.4)
Chloride: 108 mEq/L (ref 98–109)
EGFR: 49 mL/min/{1.73_m2} — ABNORMAL LOW (ref >90)
Glucose: 150 mg/dl — ABNORMAL HIGH (ref 70–140)
Potassium: 3.6 mEq/L (ref 3.5–5.1)
SODIUM: 135 meq/L — AB (ref 136–145)
Total Protein: 6.9 g/dL (ref 6.4–8.3)

## 2015-05-10 MED ORDER — ONDANSETRON HCL 8 MG PO TABS
8.0000 mg | ORAL_TABLET | Freq: Once | ORAL | Status: AC
  Administered 2015-05-10: 8 mg via ORAL

## 2015-05-10 MED ORDER — ONDANSETRON HCL 8 MG PO TABS
ORAL_TABLET | ORAL | Status: AC
  Filled 2015-05-10: qty 1

## 2015-05-10 MED ORDER — LENALIDOMIDE 10 MG PO CAPS: 10.0000 mg | ORAL_CAPSULE | Freq: Every day | ORAL | Status: DC

## 2015-05-10 MED ORDER — BORTEZOMIB CHEMO SQ INJECTION 3.5 MG (2.5MG/ML)
1.3000 mg/m2 | Freq: Once | INTRAMUSCULAR | Status: AC
  Administered 2015-05-10: 2.75 mg via SUBCUTANEOUS
  Filled 2015-05-10: qty 2.75

## 2015-05-10 NOTE — Patient Instructions (Signed)
[facility] Discharge Instructions for Patients Receiving Chemotherapy  Today you received the following chemotherapy agents Velcade. To help prevent nausea and vomiting after your treatment, we encourage you to take your nausea medication as directed.  If you develop nausea and vomiting that is not controlled by your nausea medication, call the clinic.   BELOW ARE SYMPTOMS THAT SHOULD BE REPORTED IMMEDIATELY:  *FEVER GREATER THAN 100.5 F  *CHILLS WITH OR WITHOUT FEVER  NAUSEA AND VOMITING THAT IS NOT CONTROLLED WITH YOUR NAUSEA MEDICATION  *UNUSUAL SHORTNESS OF BREATH  *UNUSUAL BRUISING OR BLEEDING  TENDERNESS IN MOUTH AND THROAT WITH OR WITHOUT PRESENCE OF ULCERS  *URINARY PROBLEMS  *BOWEL PROBLEMS  UNUSUAL RASH Items with * indicate a potential emergency and should be followed up as soon as possible.  Feel free to call the clinic you have any questions or concerns. The clinic phone number is [phone].  Please show the CHEMO ALERT CARD at check-in to the Emergency Department and triage nurse.    

## 2015-05-10 NOTE — Progress Notes (Signed)
[place]  [phone] Fax:([phone]) [phone]  Clinic New Consult Note   Patient Care Team: [name], MD as PCP - General (Internal Medicine) 05/03/2015  CHIEF COMPLAINTS: Follow-up multiple myeloma    Multiple myeloma   01/31/2015 Tumor Marker SPEP showed M-SPIKE 1.7g/dl, Cr 1.5, no anemia, hypercalcemia   03/19/2015 Imaging Bone survey showed no discrete lytic lesion, but there is osteopenia and calvarial heterogeneity.   03/27/2015 Initial Diagnosis Multiple myeloma, stage I   03/27/2015 Bone Marrow Biopsy Bone Marrow, Aspirate,Biopsy: - HYPERCELLULAR BONE MARROW FOR AGE WITH PLASMA CELL NEOPLASM 47% - SEE COMMENT. PERIPHERAL BLOOD: - NO SIGNIFICANT MORPHOLOGIC ABNORMALITIES. Diagnosis Note The bone marrow shows increased number of    03/27/2015 Miscellaneous bone marrow Cytogenetics: normal. FISH panel showed the presence of +4 and +11, this is consider standard risk of MM    04/16/2015 - 04/23/2015 Hospital Admission He was admitted for worsening back pain, was found to have a T12 pathological fracture. He underwent kyphoplasty on 04/19/2015.   04/19/2015 -  Chemotherapy RVD with weekly Velcade 1.3 mg/m, dexamethasone 40 mg, and Revlimid 10 mg daily on day 1-21, every 28 days   04/26/2015 - 05/01/2015 Hospital Admission He was admitted to [facility] for syncope episode during his radiation simulation. EKG showed ST elevation, troponin was positive, he underwent cardio catheterization which showed mild stenosis. No intervention was needed. echo showed EF 35-40%     HISTORY OF PRESENTING ILLNESS:  Eric Dudley 64 y.o. 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  He has been having low back pain for 3-4 months. He had food posinin 4 month ago, and had frequent diarrhea. He started noticed low back pain since then. The pain is not radiating  to leg, he also has intermittent right rib pain, worse with cough and sneezing. He remains to be physically active, able to do all the activities, such as gardening work housework without much limitation, but he doesn't sings slowly because of back pain. He takes Tylenol as needed, does not like the necrotic pain medication. No night sweats, no fever or chillss, no weight loss.   He was seen by his primary care physician Dr. [name], MD and had lab test (see below). He also had bone density scan 2/25 which showed osteoporosis, has not been treated yet.  Current therapy: Weekly Velcade 1.3 mg/m and dexamethasone 40 mg, Revlimid 10 mg daily on day 1-21, every 28 days, started on 04/19/2015, Revlimid was held in the middle of the first cycle due to multiple hospitalization and radiation.   INTERIM HISTORY: Eric Dudley returns for follow-up. He was admitted to [facility] on April 29, due to the syncope episode during his radiation simulation. Further workup revealed ST elevation MI, echocardiogram showed EF of 35-45%, he was seen by cardiologist and underwent cardiac catheterization which showed mild diffuse stenosis, no intervention was needed. 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 His pain has significantly improved.  He is taking Tylenol as needed, his pain is mainly located on the bilateral lateral low rib, worse when he changes position. He is able to move around with a walker. No other new complaints. He has completed his radiation treatment. He reports that he has 9 Revlimid capsules remaining.  MEDICAL HISTORY:  Past Medical History  Diagnosis Date  . Hypertension   .  Dyslipidemia   . Tonsillar cancer 2006  . Cholelithiases   . Hyperlipidemia   . Chronic kidney disease     MILD,CHRONIC    SURGICAL HISTORY: Past Surgical History  Procedure Laterality Date  . Appendectomy  1962  . Shoulder surgery  1999    left  . Knee  surgery  2012    right  . Cardiac catheterization N/A 04/30/2015    Procedure: Left Heart Cath and Coronary Angiography;  Surgeon: Peter M Martinique, MD;  Location: [facility] INVASIVE CV LAB CUPID;  Service: Cardiovascular;  Laterality: N/A;  . Cardiolite myocardial perfusion study  04/16/03    NEGITIVE BRUCE PROTOCAL EXERCISE STRESS TEST. EF 70%. NO ISCHEMIA.    SOCIAL HISTORY: History   Social History  . Marital Status: Married    Spouse Name: N/A  . Number of Children: 1   . Years of Education: N/A   Occupational History  . A retired Therapist, occupational    Social History Main Topics  . Smoking status: Former Smoker    Types: Cigars    Quit date: 12/29/2003  . Smokeless tobacco: Former Systems developer    Types: Chew    Quit date: 12/28/1994  . Alcohol Use: Yes     Comment: occasional beer  . Drug Use: No  . Sexual Activity: Not on file   Other Topics Concern  . Not on file   Social History Narrative  . No narrative on file    FAMILY HISTORY: Family History  Problem Relation Age of Onset  . Cancer Mother 31    cervical cancer   . Cancer Father     unknown cancer   . Hypertension Sister   . Cancer Sister     melanoma     ALLERGIES:  is allergic to hydrochlorothiazide; asa; hydrocodone; amoxicillin; and indapamide.  MEDICATIONS:  Current Outpatient Prescriptions  Medication Sig Dispense Refill  . acetaminophen (TYLENOL) 325 MG tablet Take 2 tablets (650 mg total) by mouth every 6 (six) hours as needed for mild pain, fever or headache.    [name] acyclovir (ZOVIRAX) 400 MG tablet Take 1 tablet (400 mg total) by mouth 2 (two) times daily. 180 tablet 1  . aspirin 81 MG chewable tablet Chew 1 tablet (81 mg total) by mouth daily.    . Bisacodyl (DULCOLAX PO) Take 3 capsules by mouth daily as needed (for constipation).     . cyclobenzaprine (FLEXERIL) 5 MG tablet Take 1 tablet (5 mg total) by mouth 3 (three) times daily. 30 tablet 0  . dexamethasone (DECADRON) 4 MG tablet Take 10 tablets (40 mg  total) by mouth once a week. Take 40 mg ( 10 tablets )  Once  A  Week  On  Day  Of  Velcade. 120 tablet 1  . ezetimibe-simvastatin (VYTORIN) 10-40 MG per tablet Take 1 tablet by mouth at bedtime.    . metoprolol succinate (TOPROL-XL) 25 MG 24 hr tablet Take 0.5 tablets (12.5 mg total) by mouth daily. 30 tablet 0  . Multiple Vitamins-Minerals (CENTRUM SILVER ULTRA MENS PO) Take 1 tablet by mouth daily.    . ondansetron (ZOFRAN) 8 MG tablet Take 1 tablet (8 mg total) by mouth 2 (two) times daily as needed for nausea or vomiting. 60 tablet 1  . polyethylene glycol (MIRALAX / GLYCOLAX) packet Take 17 g by mouth 2 (two) times daily.    . potassium chloride 20 MEQ/15ML (10%) SOLN Take 30 mLs (40 mEq total) by mouth 3 (three) times daily. [place]  mL 0  . ranitidine (ZANTAC) 150 MG tablet Take 300 mg by mouth at bedtime.     . sennosides-docusate sodium (SENOKOT-S) 8.6-50 MG tablet Take 2 tablets by mouth 2 (two) times daily.    [name] spironolactone (ALDACTONE) 25 MG tablet Take 12.5 mg by mouth daily.    [name] lenalidomide (REVLIMID) 10 MG capsule Take 1 capsule (10 mg total) by mouth daily. Take for 21 days and rest 7 days. ICD-10 is C90.00. (Patient not taking: Reported on 05/10/2015) 21 capsule 0   No current facility-administered medications for this visit.    REVIEW OF SYSTEMS:   Constitutional: Denies fevers, chills or abnormal night sweats Eyes: Denies blurriness of vision, double vision or watery eyes Ears, nose, mouth, throat, and face: Denies mucositis or sore throat Respiratory: Denies cough, dyspnea or wheezes Cardiovascular: Denies palpitation, chest discomfort or lower extremity swelling Gastrointestinal:  Denies nausea, heartburn or change in bowel habits Skin: Denies abnormal skin rashes Lymphatics: Denies new lymphadenopathy or easy bruising Neurological:Denies numbness, tingling or new weaknesses Behavioral/Psych: Mood is stable, no new changes  Musculoskeletal: Positive for back pain and  intermittent rib pain All other systems were reviewed with the patient and are negative.  PHYSICAL EXAMINATION: ECOG PERFORMANCE STATUS: 1 - Symptomatic but completely ambulatory BP 109/60 mmHg  Pulse 74  Temp(Src) 97.8 F (36.6 C) (Oral)  Resp 18  Ht _0  (1.727 m)  Wt 188 lb (85.276 kg)  BMI 28.59 kg/m2  SpO2 98%  GENERAL: Today on the table, drowsy, arousable, no distress and comfortable SKIN: skin color, texture, turgor are normal, no rashes or significant lesions EYES: normal, conjunctiva are pink and non-injected, sclera clear OROPHARYNX:no exudate, no erythema and lips, buccal mucosa, and tongue normal  NECK: supple, thyroid normal size, non-tender, without nodularity LYMPH:  no palpable lymphadenopathy in the cervical, axillary or inguinal LUNGS: clear to auscultation and percussion with normal breathing effort HEART: regular rate & rhythm and no murmurs and no lower extremity edema ABDOMEN:abdomen soft, non-tender and normal bowel sounds Musculoskeletal:no cyanosis of digits and no clubbing  PSYCH: alert & oriented x 3 with fluent speech NEURO: no focal motor/sensory deficits  LABORATORY DATA:   CBC Latest Ref Rng 05/10/2015 05/01/2015 04/29/2015  WBC 4.0 - 10.3 10e3/uL 5.5 7.4 7.8  Hemoglobin 13.0 - 17.1 g/dL 14.6 13.8 12.1(L)  Hematocrit 38.4 - 49.9 % 42.1 42.1 36.3(L)  Platelets 140 - 400 10e3/uL 286 201 165    CMP Latest Ref Rng 05/10/2015 05/01/2015 04/30/2015  Glucose 70 - 140 mg/dl 150(H) 107(H) 106(H)  BUN 7.0 - 26.0 mg/dL 8._1 Creatinine 0.7 - 1.3 mg/dL 1.5(H) 1.34(H) 1.32(H)  Sodium 136 - 145 mEq/L 135(L) 137 136  Potassium 3.5 - 5.1 mEq/L 3.6 3.7 3.0(L)  Chloride 101 - 111 mmol/L - 110 112(H)  CO2 22 - 29 mEq/L 17(L) 21(L) 20(L)  Calcium 8.4 - 10.4 mg/dL 8.7 9.2 9.1  Total Protein 6.4 - 8.3 g/dL 6.9 6.3(L) -  Total Bilirubin 0.20 - 1.20 mg/dL 0.43 0.5 -  Alkaline Phos 40 - 150 U/L 451(H) 219(H) -  AST 5 - 34 U/L 35(H) 53(H) -  ALT 0 - 55 U/L 58(H)  98(H) -    PATHOLOGY REPORT Bone Marrow, Aspirate,Biopsy, and Clot, right iliac 03/27/2015 - HYPERCELLULAR BONE MARROW FOR AGE WITH PLASMA CELL NEOPLASM. - SEE COMMENT. PERIPHERAL BLOOD: - NO SIGNIFICANT MORPHOLOGIC ABNORMALITIES. Diagnosis Note The bone marrow shows increased number of atypical plasma cells representing 47% of all cells in  the aspirate associated with prominent interstitial infiltrates and variably sized aggregates in the clot and biopsy sections. Immunohistochemical stains show that the plasma cells are kappa light chain restricted consistent with plasma cell neoplasm. The background shows trilineage  Cytogenetics  RADIOGRAPHIC STUDIES: Bone survey 03/19/2015 IMPRESSION: No discrete lytic lesion, but there is osteopenia and calvarial heterogeneity -nonspecific findings which can be manifestations of Myeloma  ASSESSMENT & PLAN:  64 year old Caucasian male with past medical history of hypertension, tonsil cancer status post surgery and radiation 10 years ago, now presented with 3-4 months low back pain and intermittent rib pain. His blood test showed M protein 1.7g/dl.  1. IgG multiple myeloma, stage I, standard risk by cytogenetics -Dr. Burr Medico discussed his bone marrow biopsy results with him. He has significant amount of plasma cells in the bone marrow 47%.  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 Based on the new notable myeloma diagnostic criteria, he meets the criteria of multiple myeloma. He has normal abdomen and a mildly elevated Michael clubbing, this is stage I. -His cytogenetics and Fish panel revealed standard risk. -His thoracic and lumbar spine MRI showed pathologic compression fracture at T12, focal  myeloma deposits at L3 and T9, diffuse marrow signal abnormality consistent with myeloma -I recommended induction chemotherapy with VRD regimen, weekly Velcade and dexamethasone, the Revlimid dose will be 10 mg daily 3 week on, one-week off, based on his renal function. -The goal of treatment is disease control, potential cure with bone marrow transplant -He is a candidate for bone marrow transplant. I'll refer him to [facility] for bone marrow transplant evaluation -He is currently on palliative radiation. Although we don't have adequate data about Velcade with concurrent radiation in multiple myeloma, this was tested in other solid tumors (lung and heck neck cancer) in phase I studies, which showed to be safe. Given he just started systemic therapy, and radiation is low dose and only 2 weeks,He'll continue his weekly Velcade and dexamethasone. - Patient reviewed with Dr. Burr Medico. He is to resume his Revlimid on 05/13/2015. And follow up in one week for re-evaluation. -His PET scan has been canceled due to his hospitalization. We'll reschedule.   2. T12 pathologic compression fracture, and diffuse myeloma involvement in bones -Status post kyphoplasty -He has completed palliative Radiation for pain control, a total of 10 fractions  -Dr. Burr Medico plans to give him monthly pamidronate after he finishes radiation   3. Back and rib pain, secondary to multiple myeloma bone lesions  -He could not tolerate narcotics -Continue Tylenol as needed  4 nonobstructive CAD, STEMI, Non ischemic CM, Takatsubo, with EF of 35-40% -Patient was initially found to have troponin elevation and anterior apical ST elevation MI and resultant cardiomyopathy on 04/26/15 -He underwent a cardiac catheterization on 5/3 which showed mild diffuse disease, no stents were placed. He is on aspirin -follow up with Dr. Percival Spanish   Follow up: in one week with lab and Velcade injection   All questions were answered.  The patient knows to call the clinic with any problems, questions or concerns. I spent 20 minutes counseling the patient face to face. The total time spent in the appointment was 30 minutes and more than 50% was on counseling.     Wynetta Emery, Gennie Dib  E, PA-C 05/03/2015  3:24 PM

## 2015-05-10 NOTE — Telephone Encounter (Signed)
Gave and printed appt sched and avs fo rpt for May adn JUNE

## 2015-05-12 NOTE — Patient Instructions (Signed)
Resume your Revlimid starting 05/13/2015 Follow up in one week

## 2015-05-12 NOTE — Progress Notes (Signed)
  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      Plan: The patient has completed radiation treatment. The patient will return to radiation oncology clinic for routine followup in one month. I advised them to call or return sooner if they have any questions or concerns related to their recovery or treatment.  -----------------------------------  Eppie Gibson, MD

## 2015-05-13 ENCOUNTER — Ambulatory Visit: Payer: Medicare Other

## 2015-05-14 ENCOUNTER — Ambulatory Visit: Payer: Medicare Other

## 2015-05-15 ENCOUNTER — Ambulatory Visit: Payer: Medicare Other

## 2015-05-16 ENCOUNTER — Ambulatory Visit: Payer: Medicare Other

## 2015-05-17 ENCOUNTER — Ambulatory Visit (HOSPITAL_BASED_OUTPATIENT_CLINIC_OR_DEPARTMENT_OTHER): Payer: Medicare Other

## 2015-05-17 ENCOUNTER — Ambulatory Visit: Payer: TRICARE For Life (TFL)

## 2015-05-17 ENCOUNTER — Other Ambulatory Visit (HOSPITAL_BASED_OUTPATIENT_CLINIC_OR_DEPARTMENT_OTHER): Payer: Medicare Other

## 2015-05-17 ENCOUNTER — Other Ambulatory Visit: Payer: TRICARE For Life (TFL)

## 2015-05-17 ENCOUNTER — Ambulatory Visit (HOSPITAL_BASED_OUTPATIENT_CLINIC_OR_DEPARTMENT_OTHER): Payer: Medicare Other | Admitting: Hematology

## 2015-05-17 ENCOUNTER — Telehealth: Payer: Self-pay | Admitting: Hematology

## 2015-05-17 ENCOUNTER — Encounter: Payer: Self-pay | Admitting: Hematology

## 2015-05-17 VITALS — BP 109/69 | HR 95 | Temp 98.0°F | Resp 18 | Ht 68.0 in | Wt 187.8 lb

## 2015-05-17 DIAGNOSIS — Z8589 Personal history of malignant neoplasm of other organs and systems: Secondary | ICD-10-CM | POA: Diagnosis not present

## 2015-05-17 DIAGNOSIS — Z5112 Encounter for antineoplastic immunotherapy: Secondary | ICD-10-CM | POA: Diagnosis not present

## 2015-05-17 DIAGNOSIS — C9000 Multiple myeloma not having achieved remission: Secondary | ICD-10-CM

## 2015-05-17 DIAGNOSIS — G893 Neoplasm related pain (acute) (chronic): Secondary | ICD-10-CM

## 2015-05-17 DIAGNOSIS — M810 Age-related osteoporosis without current pathological fracture: Secondary | ICD-10-CM | POA: Diagnosis not present

## 2015-05-17 LAB — COMPREHENSIVE METABOLIC PANEL (CC13)
ALBUMIN: 3.5 g/dL (ref 3.5–5.0)
ALK PHOS: 420 U/L — AB (ref 40–150)
ALT: 46 U/L (ref 0–55)
AST: 33 U/L (ref 5–34)
Anion Gap: 12 mEq/L — ABNORMAL HIGH (ref 3–11)
BILIRUBIN TOTAL: 0.67 mg/dL (ref 0.20–1.20)
BUN: 10.2 mg/dL (ref 7.0–26.0)
CO2: 17 mEq/L — ABNORMAL LOW (ref 22–29)
Calcium: 8.9 mg/dL (ref 8.4–10.4)
Chloride: 108 mEq/L (ref 98–109)
Creatinine: 1.3 mg/dL (ref 0.7–1.3)
EGFR: 55 mL/min/{1.73_m2} — ABNORMAL LOW (ref >90)
Glucose: 94 mg/dl (ref 70–140)
Potassium: 3.3 mEq/L — ABNORMAL LOW (ref 3.5–5.1)
SODIUM: 137 meq/L (ref 136–145)
TOTAL PROTEIN: 6.8 g/dL (ref 6.4–8.3)

## 2015-05-17 LAB — CBC & DIFF AND RETIC
BASO%: 0.2 % (ref 0.0–2.0)
Basophils Absolute: 0 10*3/uL (ref 0.0–0.1)
EOS ABS: 0.2 10*3/uL (ref 0.0–0.5)
EOS%: 2.4 % (ref 0.0–7.0)
HCT: 46.6 % (ref 38.4–49.9)
HGB: 15.9 g/dL (ref 13.0–17.1)
Immature Retic Fract: 4.4 % (ref 3.00–10.60)
LYMPH%: 5.4 % — ABNORMAL LOW (ref 14.0–49.0)
MCH: 33.1 pg (ref 27.2–33.4)
MCHC: 34.1 g/dL (ref 32.0–36.0)
MCV: 97.1 fL (ref 79.3–98.0)
MONO#: 0.6 10*3/uL (ref 0.1–0.9)
MONO%: 6.4 % (ref 0.0–14.0)
NEUT%: 85.6 % — ABNORMAL HIGH (ref 39.0–75.0)
NEUTROS ABS: 7.6 10*3/uL — AB (ref 1.5–6.5)
Platelets: 231 10*3/uL (ref 140–400)
RBC: 4.8 10*6/uL (ref 4.20–5.82)
RDW: 13.7 % (ref 11.0–14.6)
RETIC %: 3.4 % — AB (ref 0.80–1.80)
Retic Ct Abs: 163.2 10*3/uL — ABNORMAL HIGH (ref 34.80–93.90)
WBC: 8.9 10*3/uL (ref 4.0–10.3)
lymph#: 0.5 10*3/uL — ABNORMAL LOW (ref 0.9–3.3)

## 2015-05-17 MED ORDER — POTASSIUM CHLORIDE CRYS ER 20 MEQ PO TBCR
40.0000 meq | EXTENDED_RELEASE_TABLET | Freq: Once | ORAL | Status: AC
  Administered 2015-05-17: 40 meq via ORAL
  Filled 2015-05-17: qty 2

## 2015-05-17 MED ORDER — POTASSIUM CHLORIDE CRYS ER 20 MEQ PO TBCR: 20.0000 meq | EXTENDED_RELEASE_TABLET | Freq: Every day | ORAL | Status: DC

## 2015-05-17 MED ORDER — ONDANSETRON HCL 8 MG PO TABS
8.0000 mg | ORAL_TABLET | Freq: Once | ORAL | Status: AC
  Administered 2015-05-17: 8 mg via ORAL

## 2015-05-17 MED ORDER — ONDANSETRON HCL 8 MG PO TABS
ORAL_TABLET | ORAL | Status: AC
  Filled 2015-05-17: qty 1

## 2015-05-17 MED ORDER — BORTEZOMIB CHEMO SQ INJECTION 3.5 MG (2.5MG/ML)
1.3000 mg/m2 | Freq: Once | INTRAMUSCULAR | Status: AC
  Administered 2015-05-17: 2.75 mg via SUBCUTANEOUS
  Filled 2015-05-17: qty 2.75

## 2015-05-17 NOTE — Telephone Encounter (Signed)
Added pt appts...the patient will come back after chemo to pick up .

## 2015-05-17 NOTE — Progress Notes (Signed)
Dr. Burr Medico notified of patient's K+ of 3.3.  Order placed by MD for 40 meq PO potassium while here for treatment and rx sent to patient's pharmacy.

## 2015-05-17 NOTE — Patient Instructions (Signed)
[facility] Discharge Instructions for Patients Receiving Chemotherapy  Today you received the following chemotherapy agents Velcade. To help prevent nausea and vomiting after your treatment, we encourage you to take your nausea medication as directed.  If you develop nausea and vomiting that is not controlled by your nausea medication, call the clinic.   BELOW ARE SYMPTOMS THAT SHOULD BE REPORTED IMMEDIATELY:  *FEVER GREATER THAN 100.5 F  *CHILLS WITH OR WITHOUT FEVER  NAUSEA AND VOMITING THAT IS NOT CONTROLLED WITH YOUR NAUSEA MEDICATION  *UNUSUAL SHORTNESS OF BREATH  *UNUSUAL BRUISING OR BLEEDING  TENDERNESS IN MOUTH AND THROAT WITH OR WITHOUT PRESENCE OF ULCERS  *URINARY PROBLEMS  *BOWEL PROBLEMS  UNUSUAL RASH Items with * indicate a potential emergency and should be followed up as soon as possible.  Feel free to call the clinic you have any questions or concerns. The clinic phone number is [phone].  Please show the CHEMO ALERT CARD at check-in to the Emergency Department and triage nurse.    

## 2015-05-17 NOTE — Progress Notes (Signed)
[place]  [phone] [phone] [place] Note   Patient Care Team: Eric Seashore, MD as PCP - General (Internal Medicine) 05/17/2015  CHIEF COMPLAINTS: Follow-up multiple myeloma    Multiple myeloma   01/31/2015 Tumor Marker SPEP showed M-SPIKE 1.7g/dl, Cr 1.5, no anemia, hypercalcemia   03/19/2015 Imaging Bone survey showed no discrete lytic lesion, but there is osteopenia and calvarial heterogeneity.   03/27/2015 Initial Diagnosis Multiple myeloma, stage I   03/27/2015 Bone Marrow Biopsy Bone Marrow, Aspirate,Biopsy: - HYPERCELLULAR BONE MARROW FOR AGE WITH PLASMA CELL NEOPLASM 47% - SEE COMMENT. PERIPHERAL BLOOD: - NO SIGNIFICANT MORPHOLOGIC ABNORMALITIES. Diagnosis Note The bone marrow shows increased number of    03/27/2015 Miscellaneous bone marrow Cytogenetics: normal. FISH panel showed the presence of +4 and +11, this is consider standard risk of MM    04/16/2015 - 04/23/2015 Hospital Admission He was admitted for worsening back pain, was found to have a T12 pathological fracture. He underwent kyphoplasty on 04/19/2015.   04/19/2015 -  Chemotherapy RVD with weekly Velcade 1.3 mg/m, dexamethasone 40 mg, and Revlimid 10 mg daily on day 1-21, every 28 days   04/26/2015 - 05/01/2015 Hospital Admission He was admitted to [facility] for syncope episode during his radiation simulation. EKG showed ST elevation, troponin was positive, he underwent cardio catheterization which showed mild stenosis. No intervention was needed. echo showed EF 35-40%   04/29/2015 - 05/10/2015 Radiation Therapy palliative RT to T12, 20Gy in 10 fractions     HISTORY OF PRESENTING ILLNESS:  Eric Dudley 64 y.o. 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  He has been having low back pain for 3-4 months. He had food posinin 4 month ago, and had  frequent diarrhea. He started noticed low back pain since then. The pain is not radiating to leg, he also has intermittent right rib pain, worse with cough and sneezing. He remains to be physically active, able to do all the activities, such as gardening work housework without much limitation, but he doesn't sings slowly because of back pain. He takes Tylenol as needed, does not like the necrotic pain medication. No night sweats, no fever or chillss, no weight loss.   He was seen by his primary care physician Dr. [name], MD and had lab test (see below). He also had bone density scan 2/25 which showed osteoporosis, has not been treated yet.  Current therapy: Weekly Velcade 1.3 mg/m and dexamethasone 40 mg, Revlimid 10 mg daily on day 1-21, every 28 days, started on 04/19/2015, Revlimid was held in the middle of the first cycle due to multiple hospitalization and radiation.   INTERIM HISTORY: Eric Dudley returns for follow-up. He finished radiation on 05/10/2015. He has been released from the rehabilitation. He has restarted Revlimid on May 16. He is here for weekly cycle 6 bortezomib. His back pain has significantly improved, he resolved after the radiation. He still has persistent bilateral lateral rib pain, for which he takes Tylenol and ibuprofen. He is able to move around much more lately, able to walk independently. He has good energy and appetite, no other new complaints. No fever or chills. No neuropathy.   MEDICAL HISTORY:  Past Medical History  Diagnosis Date  . Hypertension   . Dyslipidemia   . Tonsillar cancer 2006  . Cholelithiases   . Hyperlipidemia   . Chronic  kidney disease     [place]    SURGICAL HISTORY: Past Surgical History  Procedure Laterality Date  . Appendectomy  1962  . Shoulder surgery  1999    left  . Knee surgery  2012    right  . Cardiac catheterization N/A 04/30/2015    Procedure: Left Heart Cath and Coronary Angiography;  Surgeon: Peter M  Martinique, MD;  Location: [facility] INVASIVE CV LAB CUPID;  Service: Cardiovascular;  Laterality: N/A;  . Cardiolite myocardial perfusion study  04/16/03    NEGITIVE BRUCE PROTOCAL EXERCISE STRESS TEST. EF 70%. NO ISCHEMIA.    SOCIAL HISTORY: History   Social History  . Marital Status: Married    Spouse Name: N/A  . Number of Children: 1   . Years of Education: N/A   Occupational History  . A retired Therapist, occupational    Social History Main Topics  . Smoking status: Former Smoker    Types: Cigars    Quit date: 12/29/2003  . Smokeless tobacco: Former Systems developer    Types: Chew    Quit date: 12/28/1994  . Alcohol Use: Yes     Comment: occasional beer  . Drug Use: No  . Sexual Activity: Not on file   Other Topics Concern  . Not on file   Social History Narrative  . No narrative on file    FAMILY HISTORY: Family History  Problem Relation Age of Onset  . Cancer Mother 27    cervical cancer   . Cancer Father     unknown cancer   . Hypertension Sister   . Cancer Sister     melanoma     ALLERGIES:  is allergic to hydrochlorothiazide; asa; hydrocodone; amoxicillin; and indapamide.  MEDICATIONS:  Current Outpatient Prescriptions  Medication Sig Dispense Refill  . acetaminophen (TYLENOL) 325 MG tablet Take 2 tablets (650 mg total) by mouth every 6 (six) hours as needed for mild pain, fever or headache.    [name] acyclovir (ZOVIRAX) 400 MG tablet Take 1 tablet (400 mg total) by mouth 2 (two) times daily. 180 tablet 1  . aspirin 81 MG chewable tablet Chew 1 tablet (81 mg total) by mouth daily.    . Bisacodyl (DULCOLAX PO) Take 3 capsules by mouth daily as needed (for constipation).     . cyclobenzaprine (FLEXERIL) 5 MG tablet Take 1 tablet (5 mg total) by mouth 3 (three) times daily. 30 tablet 0  . dexamethasone (DECADRON) 4 MG tablet Take 10 tablets (40 mg total) by mouth once a week. Take 40 mg ( 10 tablets )  Once  A  Week  On  Day  Of  Velcade. 120 tablet 1  . ezetimibe-simvastatin (VYTORIN)  10-40 MG per tablet Take 1 tablet by mouth at bedtime.    [name] lenalidomide (REVLIMID) 10 MG capsule Take 1 capsule (10 mg total) by mouth daily. Take for 21 days and rest 7 days. ICD-10 is C90.00. (Patient taking differently: Take 10 mg by mouth daily. Take for 21 days and rest 7 days. ICD-10 is C90.00. Started mon 05/13/15) 21 capsule 0  . metoprolol succinate (TOPROL-XL) 25 MG 24 hr tablet Take 0.5 tablets (12.5 mg total) by mouth daily. 30 tablet 0  . Multiple Vitamins-Minerals (CENTRUM SILVER ULTRA MENS PO) Take 1 tablet by mouth daily.    . ondansetron (ZOFRAN) 8 MG tablet Take 1 tablet (8 mg total) by mouth 2 (two) times daily as needed for nausea or vomiting. 60 tablet 1  . polyethylene glycol (MIRALAX /  GLYCOLAX) packet Take 17 g by mouth 2 (two) times daily.    . potassium chloride 20 MEQ/15ML (10%) SOLN Take 30 mLs (40 mEq total) by mouth 3 (three) times daily. 450 mL 0  . ranitidine (ZANTAC) 150 MG tablet Take 300 mg by mouth at bedtime.     . sennosides-docusate sodium (SENOKOT-S) 8.6-50 MG tablet Take 2 tablets by mouth 2 (two) times daily.    [name] spironolactone (ALDACTONE) 25 MG tablet Take 12.5 mg by mouth daily.     No current facility-administered medications for this visit.    REVIEW OF SYSTEMS:   Constitutional: Denies fevers, chills or abnormal night sweats Eyes: Denies blurriness of vision, double vision or watery eyes Ears, nose, mouth, throat, and face: Denies mucositis or sore throat Respiratory: Denies cough, dyspnea or wheezes Cardiovascular: Denies palpitation, chest discomfort or lower extremity swelling Gastrointestinal:  Denies nausea, heartburn or change in bowel habits Skin: Denies abnormal skin rashes Lymphatics: Denies new lymphadenopathy or easy bruising Neurological:Denies numbness, tingling or new weaknesses Behavioral/Psych: Mood is stable, no new changes  Musculoskeletal: Positive for back pain and intermittent rib pain All other systems were reviewed with  the patient and are negative.  PHYSICAL EXAMINATION: ECOG PERFORMANCE STATUS: 1 - Symptomatic but completely ambulatory BP 109/69 mmHg  Pulse 95  Temp(Src) 98 F (36.7 C) (Oral)  Resp 18  Ht _0  (1.727 m)  Wt 187 lb 12.8 oz (85.186 kg)  BMI 28.56 kg/m2  SpO2 98%  GENERAL: Today on the table, drowsy, arousable, no distress and comfortable SKIN: skin color, texture, turgor are normal, no rashes or significant lesions EYES: normal, conjunctiva are pink and non-injected, sclera clear OROPHARYNX:no exudate, no erythema and lips, buccal mucosa, and tongue normal  NECK: supple, thyroid normal size, non-tender, without nodularity LYMPH:  no palpable lymphadenopathy in the cervical, axillary or inguinal LUNGS: clear to auscultation and percussion with normal breathing effort HEART: regular rate & rhythm and no murmurs and no lower extremity edema ABDOMEN:abdomen soft, non-tender and normal bowel sounds Musculoskeletal:no cyanosis of digits and no clubbing, mild tenderness on bilateral lateral chest wall  PSYCH: alert & oriented x 3 with fluent speech NEURO: no focal motor/sensory deficits  LABORATORY DATA:   CBC Latest Ref Rng 05/17/2015 05/10/2015 05/01/2015  WBC 4.0 - 10.3 10e3/uL 8.9 5.5 7.4  Hemoglobin 13.0 - 17.1 g/dL 15.9 14.6 13.8  Hematocrit 38.4 - 49.9 % 46.6 42.1 42.1  Platelets 140 - 400 10e3/uL 231 286 201   CMP Latest Ref Rng 05/17/2015 05/10/2015 05/01/2015  Glucose 70 - 140 mg/dl 94 150(H) 107(H)  BUN 7.0 - 26.0 mg/dL 10.2 8.6 8  Creatinine 0.7 - 1.3 mg/dL 1.3 1.5(H) 1.34(H)  Sodium 136 - 145 mEq/L 137 135(L) 137  Potassium 3.5 - 5.1 mEq/L 3.3(L) 3.6 3.7  Chloride 101 - 111 mmol/L - - 110  CO2 22 - 29 mEq/L 17(L) 17(L) 21(L)  Calcium 8.4 - 10.4 mg/dL 8.9 8.7 9.2  Total Protein 6.4 - 8.3 g/dL 6.8 6.9 6.3(L)  Total Bilirubin 0.20 - 1.20 mg/dL 0.67 0.43 0.5  Alkaline Phos 40 - 150 U/L 420(H) 451(H) 219(H)  AST 5 - 34 U/L 33 35(H) 53(H)  ALT 0 - 55 U/L 46 58(H) 98(H)      PATHOLOGY REPORT Bone Marrow, Aspirate,Biopsy, and Clot, right iliac 03/27/2015 - HYPERCELLULAR BONE MARROW FOR AGE WITH PLASMA CELL NEOPLASM. - SEE COMMENT. PERIPHERAL BLOOD: - NO SIGNIFICANT MORPHOLOGIC ABNORMALITIES. Diagnosis Note The bone marrow shows increased number of atypical plasma  cells representing 47% of all cells in the aspirate associated with prominent interstitial infiltrates and variably sized aggregates in the clot and biopsy sections. Immunohistochemical stains show that the plasma cells are kappa light chain restricted consistent with plasma cell neoplasm. The background shows trilineage  Cytogenetics: normal FISH panel: +4 and +11  RADIOGRAPHIC STUDIES: Bone survey 03/19/2015 IMPRESSION: No discrete lytic lesion, but there is osteopenia and calvarial heterogeneity -nonspecific findings which can be manifestations of Myeloma  ASSESSMENT & PLAN:  64 year old Caucasian male with past medical history of hypertension, tonsil cancer status post surgery and radiation 10 years ago, now presented with 3-4 months low back pain and intermittent rib pain. His blood test showed M protein 1.7g/dl.  1. IgG multiple myeloma, stage I, standard risk by cytogenetics (+4 and +11) -I discussed his bone marrow biopsy results with him. He has significant amount of plasma cells in the bone marrow 47%.  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 Based on the new notable myeloma diagnostic criteria, he meets the criteria of multiple myeloma. He has normal albumin and miildly elevated beta 2 microglobulin, this is stage I. -His cytogenetics and Fish panel revealed standard risk. -His thoracic and lumbar spine MRI showed pathologic  compression fracture at T12, focal myeloma deposits at L3 and T9, diffuse marrow signal abnormality consistent with myeloma -I recommended induction chemotherapy with VRD regimen, weekly Velcade and dexamethasone, the Revlimid dose will be 10 mg daily 3 week on, one-week off, based on his renal function. -The goal of treatment is disease control, potential cure with bone marrow transplant -He is a candidate for bone marrow transplant. I'll refer him to [facility] for bone marrow transplant evaluation -Continue week 6 treatment, continue Revlimid as second cycle.  -His PET scan has been canceled due to his hospitalization. We'll reschedule.   2. T12 pathologic compression fracture, and diffuse myeloma involvement in bones -Status post kyphoplasty and palliative radiation  -He has started palliative Radiation, pain much improved  -start pamidronate every 4 weeks next week   3. Back and rib pain, secondary to multiple myeloma bone lesions  -improved  -He could not tolerate narcotics -Continue Tylenol as needed  4 nonobstructive CAD, STEMI, Non ischemic CM, Takatsubo, with EF of 35-40% -Patient was initially found to have troponin elevation and anterior apical ST elevation MI and resultant cardiomyopathy on 04/26/15 -He underwent a cardiac catheterization on 5/3 which showed mild diffuse disease, no stents were placed. He is on aspirin -follow up with Dr. Percival Spanish   Follow up: -continue VRD -Start pamidronate every 4 weeks next week -He will see APP or me every 2 weeks   All questions were answered. The patient knows to call the clinic with any problems, questions or concerns.  I spent 25 minutes counseling the patient face to face. The total time spent in the appointment was 30 minutes and more than 50% was on counseling.     Truitt Merle, MD 05/17/2015  9:58 AM

## 2015-05-18 ENCOUNTER — Encounter: Payer: Self-pay | Admitting: Hematology

## 2015-05-18 DIAGNOSIS — M810 Age-related osteoporosis without current pathological fracture: Secondary | ICD-10-CM | POA: Insufficient documentation

## 2015-05-20 ENCOUNTER — Telehealth: Payer: Self-pay | Admitting: Hematology

## 2015-05-20 ENCOUNTER — Telehealth: Payer: Self-pay | Admitting: *Deleted

## 2015-05-20 ENCOUNTER — Ambulatory Visit (INDEPENDENT_AMBULATORY_CARE_PROVIDER_SITE_OTHER): Payer: Medicare Other | Admitting: Internal Medicine

## 2015-05-20 ENCOUNTER — Encounter: Payer: Self-pay | Admitting: Internal Medicine

## 2015-05-20 VITALS — BP 104/78 | HR 82 | Ht 68.5 in | Wt 187.2 lb

## 2015-05-20 DIAGNOSIS — C9000 Multiple myeloma not having achieved remission: Secondary | ICD-10-CM

## 2015-05-20 DIAGNOSIS — I214 Non-ST elevation (NSTEMI) myocardial infarction: Secondary | ICD-10-CM

## 2015-05-20 DIAGNOSIS — I428 Other cardiomyopathies: Secondary | ICD-10-CM

## 2015-05-20 DIAGNOSIS — N182 Chronic kidney disease, stage 2 (mild): Secondary | ICD-10-CM | POA: Diagnosis not present

## 2015-05-20 DIAGNOSIS — I499 Cardiac arrhythmia, unspecified: Secondary | ICD-10-CM

## 2015-05-20 DIAGNOSIS — I429 Cardiomyopathy, unspecified: Secondary | ICD-10-CM | POA: Diagnosis not present

## 2015-05-20 DIAGNOSIS — R0602 Shortness of breath: Secondary | ICD-10-CM | POA: Diagnosis not present

## 2015-05-20 MED ORDER — LENALIDOMIDE 10 MG PO CAPS: 10.0000 mg | ORAL_CAPSULE | Freq: Every day | ORAL | Status: DC

## 2015-05-20 NOTE — Progress Notes (Signed)
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 He has been on Vytorin 10/40 mg. Recently, a lipid NMR demonstrated a high particle number at 1323 with an LDL cholesterol of 67, indicating increased risk. He also underwent a [place] heart protocol per your request, which showed an elevated lipoprotein(a) at 155 and elevated insulin level, suggesting early insulin resistance. Hemoglobin was 5.8. APOB levels were mildly elevated as well as APOA1 fractions. However, overall cholesterol control is fairly good. In the past, I had recommended changing him to Crestor; however, apparently he was intolerant to this in the past as well as Lipitor and another statin, which he does not recall. He is on low-dose Diovan for hypertension and had been on Norvasc in the past. The Diovan was apparently started by Dr. Einar Gip prior to my caring for him and I feel that this is a reasonable medicine for blood pressure control. Symptomatically, he denies any chest pain, shortness of breath, palpitations, presyncope, or syncopal symptoms.   I the pleasure see [name] back in the office today. He was recently hospitalized after presenting with a syncopal event. He was admitted on 4/19 for acute back pain where he was found to have a pathological T12 fracture IR consulted and recommended kyphoplasty which was performed on 04/19/15.Patient came to his regular scheduled injection of Velcade at the cancer center 4/29 and was being mapped for XRT and while being transferred from chair to table he was dropped down below usual 30. Subsequent to this he had an episode wherein he had severe pain and became unresponsive for about 20 seconds. EKG demonstrated anteroapical  ST elevation and he had car catheterization on 04/30/2015. This demonstratedProx RCA lesion, 30% stenosed. Ost LM to LM lesion, 30% stenosed, normal LVEDP. Echocardiography, however demonstrated reduced LV function with an EF of 35-40% and a large anteroapical aneurysm. The etiology of this aneurysm is not quite clear but thought to be related to prior radiation therapy and or chemotherapy for tonsillar cancer. He did suffer an NSTEMI, with troponin elevation to greater than 4. He was placed on aspirin and appropriately Plavix. There was some question as to whether he needed to continue this, however based on the CURE trial data, there is indication for therapy at least one year even in patients who did not have prior stenting. There is also new are data using Brilinta with similar if not better outcome data. Currently he reports some fatigue and had an episode yesterday where he vomited number of times and is somewhat weak today. Blood pressure is low normal.  PMHx:  Past Medical History  Diagnosis Date  . Hypertension   . Dyslipidemia   . Tonsillar cancer 2006  . Cholelithiases   . Hyperlipidemia   . Chronic kidney disease     [place]    Past Surgical History  Procedure Laterality Date  . Appendectomy  1962  . Shoulder surgery  1999    left  . Knee surgery  2012    right  . Cardiac catheterization N/A 04/30/2015    Procedure: Left Heart Cath and Coronary Angiography;  Surgeon: Peter M Martinique, MD;  Location: [facility] INVASIVE CV LAB CUPID;  Service: Cardiovascular;  Laterality: N/A;  . Cardiolite myocardial perfusion study  04/16/03    NEGITIVE BRUCE PROTOCAL EXERCISE STRESS TEST. EF 70%. NO ISCHEMIA.    FAMHx:  Family History  Problem Relation Age of Onset  . Cancer Mother 53    cervical cancer   . Cancer Father     unknown cancer   . Hypertension Sister   . Cancer Sister     melanoma     SOCHx:   reports that he quit smoking about 11 years ago. His smoking use included  Cigars. He quit smokeless tobacco use about 20 years ago. His smokeless tobacco use included Chew. He reports that he drinks alcohol. He reports that he does not use illicit drugs.  ALLERGIES:  Allergies  Allergen Reactions  . Hydrochlorothiazide Rash and Palpitations  . Asa [Aspirin] Nausea Only  . Hydrocodone Itching and Other (See Comments)    Jittery, hyperactivity   . Amoxicillin Itching and Rash  . Indapamide Palpitations    Lightheadedness, dizziness    ROS: A comprehensive review of systems was negative except for: Constitutional: positive for fatigue and Weakness  HOME MEDS: Current Outpatient Prescriptions  Medication Sig Dispense Refill  . acetaminophen (TYLENOL) 325 MG tablet Take 2 tablets (650 mg total) by mouth every 6 (six) hours as needed for mild pain, fever or headache.    [name] acyclovir (ZOVIRAX) 400 MG tablet Take 1 tablet (400 mg total) by mouth 2 (two) times daily. 180 tablet 1  . aspirin 81 MG chewable tablet Chew 1 tablet (81 mg total) by mouth daily.    . Bisacodyl (DULCOLAX PO) Take 3 capsules by mouth daily as needed (for constipation).     . BORTEZOMIB IJ Inject as directed once a week.    . calcium carbonate (OS-CAL) 600 MG TABS tablet Take 1,200 mg by mouth daily with breakfast.    . Cholecalciferol (VITAMIN D-3) 1000 UNITS CAPS Take 1,000 Units by mouth daily.    . clopidogrel (PLAVIX) 75 MG tablet Take 75 mg by mouth daily.    . cyclobenzaprine (FLEXERIL) 5 MG tablet Take 1 tablet (5 mg total) by mouth 3 (three) times daily. 30 tablet 0  . dexamethasone (DECADRON) 4 MG tablet Take 10 tablets (40 mg total) by mouth once a week. Take 40 mg ( 10 tablets )  Once  A  Week  On  Day  Of  Velcade. 120 tablet 1  . ezetimibe-simvastatin (VYTORIN) 10-40 MG per tablet Take 1 tablet by mouth at bedtime.    . hydrOXYzine (VISTARIL) 25 MG capsule Take 25 mg by mouth as needed.    . metoprolol succinate (TOPROL-XL) 25 MG 24 hr tablet Take 0.5 tablets (12.5 mg total) by  mouth daily. 30 tablet 0  . Multiple Vitamins-Minerals (CENTRUM SILVER ULTRA MENS PO) Take 1 tablet by mouth daily.    . nitroGLYCERIN (NITROSTAT) 0.4 MG SL tablet Place 0.4 mg under the tongue every 5 (five) minutes as needed for chest pain.    [name] ondansetron (ZOFRAN) 8 MG tablet Take 1 tablet (8 mg total) by mouth 2 (two) times daily as needed for nausea or vomiting. 60 tablet 1  . pamidronate (AREDIA) 30 MG injection Inject into the vein every 28 (twenty-eight) days.    . polyethylene glycol (MIRALAX / GLYCOLAX) packet Take 17 g by mouth 2 (two) times daily.    . potassium chloride SA (K-DUR,KLOR-CON) 20 MEQ tablet Take 40 mEq by mouth 2 (two) times daily.    . ranitidine (ZANTAC) 150 MG tablet Take 300 mg by mouth  at bedtime.     . sennosides-docusate sodium (SENOKOT-S) 8.6-50 MG tablet Take 2 tablets by mouth 2 (two) times daily.    [name] spironolactone (ALDACTONE) 25 MG tablet Take 12.5 mg by mouth daily.    [name] lenalidomide (REVLIMID) 10 MG capsule Take 1 capsule (10 mg total) by mouth daily. Take for 21 days and rest 7 days. ICD-10 is C90.00. Started mon 05/13/15 21 capsule 0   No current facility-administered medications for this visit.    LABS/IMAGING: No results found for this or any previous visit (from the past 48 hour(s)). No results found.  VITALS: BP 104/78 mmHg  Pulse 82  Ht 5' 8.5" (1.74 m)  Wt 187 lb 3.2 oz (84.913 kg)  BMI 28.05 kg/m2  EXAM: General appearance: alert and no distress Neck: no adenopathy, no carotid bruit, no JVD, supple, symmetrical, trachea midline and thyroid not enlarged, symmetric, no tenderness/mass/nodules Lungs: clear to auscultation bilaterally Heart: regular rate and rhythm, S1, S2 normal, no murmur Abdomen: soft, non-tender; bowel sounds normal; no masses,  no organomegaly Extremities: extremities normal, atraumatic, no cyanosis or edema Pulses: 2+ and symmetric Skin: Skin color, texture, turgor normal. No rashes or lesions Neurologic: Grossly  normal  EKG: Deferred  ASSESSMENT: 1. Nonischemic cardiomyopathy, EF 35-40%, NYHA class II symptoms 2. 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 Hypertension 4. Dyslipidemia 5. History of tonsillar cancer 6. Multiple myeloma-newly diagnosed with pathologic fracture 7. CKD2  PLAN: 1.   Mr. Katich had a new episode of syncope and was found to have a nonischemic cardio myopathy with a large anteroapical aneurysm. There was evidence for troponin elevation consistent with a non-ST elevation MI. He's currently on aspirin and Plavix which is appropriate and should continue for at least a year, but could be interrupted if necessary for procedures or if bleeding occurs. His cardiomyopathy may be related to prior chemotherapy and/or radiation. He is currently undergoing treatment for newly diagnosed multiple myeloma in the setting of pathologic fractures. He is on metoprolol and was previously on Diovan, but this was discontinued due to chronic kidney disease and currently blood pressure will not support the addition of this medication for his heart failure. Renal function will also not support the addition of Entresto. EF is greater than 35%, therefore no clear indication for AICD therapy.  I recommend continuing current medications and we'll recheck an echo before his follow-up appointment in 6 months.  Pixie Casino, MD, [facility] Attending Cardiologist [facility] [place] 05/20/2015, 12:49 PM

## 2015-05-20 NOTE — Telephone Encounter (Signed)
Received call from [facility] script stating that form sent from revlimid needs dual signature line to comply with [place] rules.  Req that script be resent with second line for selection permitted.  OK to resend electronically.  This was done.  Expressed concern that script sent 05/10/15 & it is now 05/20/15 & hopefully pt has enough med.  Pharmacist states that they have not received a call from pt.

## 2015-05-20 NOTE — Patient Instructions (Signed)
Your physician wants you to follow-up in: 6 months with Dr. Debara Pickett. You will receive a reminder letter in the mail two months in advance. If you don't receive a letter, please call our office to schedule the follow-up appointment.  Your physician has requested that you have an echocardiogram in 6 months. Echocardiography is a painless test that uses sound waves to create images of your heart. It provides your doctor with information about the size and shape of your heart and how well your heart's chambers and valves are working. This procedure takes approximately one hour. There are no restrictions for this procedure.

## 2015-05-20 NOTE — Telephone Encounter (Signed)
Pt appt. To see Dr. Blinda Leatherwood is 06/05/15@7 :30. Slides and scans will be fedex'ed

## 2015-05-21 ENCOUNTER — Telehealth: Payer: Self-pay | Admitting: *Deleted

## 2015-05-21 ENCOUNTER — Other Ambulatory Visit: Payer: Self-pay | Admitting: *Deleted

## 2015-05-21 DIAGNOSIS — C9000 Multiple myeloma not having achieved remission: Secondary | ICD-10-CM

## 2015-05-21 MED ORDER — LENALIDOMIDE 10 MG PO CAPS: 10.0000 mg | ORAL_CAPSULE | Freq: Every day | ORAL | Status: DC

## 2015-05-21 NOTE — Telephone Encounter (Signed)
Received message from pt that he has only 1 pill of Revlimid left.  Pt had contacted [facility] and was informed that prescription was on hold. Spoke with [facility] pharmacy and was informed that [facility] will no longer fill Revlimid for patients. Spoke with Altha Harm, pharmacist at [facility].  New prescription signed by Dr. Burr Medico with same authorization # obtained on 05/10/15 faxed to [facility] - with pt's  Account  #   [id number]. [facility]   Phone   [phone]   ; Fax     [phone].

## 2015-05-22 ENCOUNTER — Telehealth: Payer: Self-pay | Admitting: *Deleted

## 2015-05-22 NOTE — Telephone Encounter (Signed)
Spoke with [employer] with [facility].  They need clarification as Dr. Burr Medico signed on both lines.  There is no generic for Revlimid so instructed them to dispense Revlimid as ordered.

## 2015-05-24 ENCOUNTER — Ambulatory Visit (HOSPITAL_BASED_OUTPATIENT_CLINIC_OR_DEPARTMENT_OTHER): Payer: Medicare Other

## 2015-05-24 ENCOUNTER — Other Ambulatory Visit: Payer: Self-pay | Admitting: Hematology

## 2015-05-24 ENCOUNTER — Other Ambulatory Visit: Payer: Self-pay | Admitting: *Deleted

## 2015-05-24 ENCOUNTER — Ambulatory Visit: Payer: TRICARE For Life (TFL)

## 2015-05-24 VITALS — BP 120/69 | HR 83 | Temp 97.6°F | Resp 18

## 2015-05-24 DIAGNOSIS — C9000 Multiple myeloma not having achieved remission: Secondary | ICD-10-CM

## 2015-05-24 DIAGNOSIS — M810 Age-related osteoporosis without current pathological fracture: Secondary | ICD-10-CM

## 2015-05-24 DIAGNOSIS — Z5112 Encounter for antineoplastic immunotherapy: Secondary | ICD-10-CM

## 2015-05-24 LAB — COMPREHENSIVE METABOLIC PANEL (CC13)
ALT: 67 U/L — ABNORMAL HIGH (ref 0–55)
ANION GAP: 10 meq/L (ref 3–11)
AST: 40 U/L — ABNORMAL HIGH (ref 5–34)
Albumin: 3.5 g/dL (ref 3.5–5.0)
Alkaline Phosphatase: 323 U/L — ABNORMAL HIGH (ref 40–150)
BILIRUBIN TOTAL: 0.57 mg/dL (ref 0.20–1.20)
BUN: 8.7 mg/dL (ref 7.0–26.0)
CO2: 16 mEq/L — ABNORMAL LOW (ref 22–29)
Calcium: 8.9 mg/dL (ref 8.4–10.4)
Chloride: 107 mEq/L (ref 98–109)
Creatinine: 1.4 mg/dL — ABNORMAL HIGH (ref 0.7–1.3)
EGFR: 54 mL/min/{1.73_m2} — AB (ref >90)
Glucose: 117 mg/dl (ref 70–140)
Potassium: 3.7 mEq/L (ref 3.5–5.1)
Sodium: 133 mEq/L — ABNORMAL LOW (ref 136–145)
TOTAL PROTEIN: 6.8 g/dL (ref 6.4–8.3)

## 2015-05-24 LAB — CBC & DIFF AND RETIC
BASO%: 0.1 % (ref 0.0–2.0)
BASOS ABS: 0 10*3/uL (ref 0.0–0.1)
EOS%: 1.8 % (ref 0.0–7.0)
Eosinophils Absolute: 0.2 10*3/uL (ref 0.0–0.5)
HCT: 47.5 % (ref 38.4–49.9)
HEMOGLOBIN: 16.4 g/dL (ref 13.0–17.1)
IMMATURE RETIC FRACT: 5.3 % (ref 3.00–10.60)
LYMPH%: 3.7 % — AB (ref 14.0–49.0)
MCH: 33.3 pg (ref 27.2–33.4)
MCHC: 34.5 g/dL (ref 32.0–36.0)
MCV: 96.5 fL (ref 79.3–98.0)
MONO#: 0.9 10*3/uL (ref 0.1–0.9)
MONO%: 6.8 % (ref 0.0–14.0)
NEUT#: 10.9 10*3/uL — ABNORMAL HIGH (ref 1.5–6.5)
NEUT%: 87.6 % — ABNORMAL HIGH (ref 39.0–75.0)
Platelets: 166 10*3/uL (ref 140–400)
RBC: 4.92 10*6/uL (ref 4.20–5.82)
RDW: 13.6 % (ref 11.0–14.6)
Retic %: 2.19 % — ABNORMAL HIGH (ref 0.80–1.80)
Retic Ct Abs: 107.75 10*3/uL — ABNORMAL HIGH (ref 34.80–93.90)
WBC: 12.5 10*3/uL — AB (ref 4.0–10.3)
lymph#: 0.5 10*3/uL — ABNORMAL LOW (ref 0.9–3.3)

## 2015-05-24 MED ORDER — CYCLOBENZAPRINE HCL 5 MG PO TABS: 5.0000 mg | ORAL_TABLET | Freq: Three times a day (TID) | ORAL | Status: DC

## 2015-05-24 MED ORDER — ONDANSETRON HCL 8 MG PO TABS
8.0000 mg | ORAL_TABLET | Freq: Once | ORAL | Status: AC
  Administered 2015-05-24: 8 mg via ORAL

## 2015-05-24 MED ORDER — SODIUM CHLORIDE 0.9 % IV SOLN
Freq: Once | INTRAVENOUS | Status: AC
  Administered 2015-05-24: 11:00:00 via INTRAVENOUS

## 2015-05-24 MED ORDER — SODIUM CHLORIDE 0.9 % IV SOLN
90.0000 mg | Freq: Once | INTRAVENOUS | Status: AC
  Administered 2015-05-24: 90 mg via INTRAVENOUS
  Filled 2015-05-24: qty 10

## 2015-05-24 MED ORDER — BORTEZOMIB CHEMO SQ INJECTION 3.5 MG (2.5MG/ML)
1.3000 mg/m2 | Freq: Once | INTRAMUSCULAR | Status: AC
  Administered 2015-05-24: 2.75 mg via SUBCUTANEOUS
  Filled 2015-05-24: qty 2.75

## 2015-05-24 MED ORDER — RANITIDINE HCL 150 MG PO TABS: 300.0000 mg | ORAL_TABLET | Freq: Every day | ORAL | Status: AC

## 2015-05-24 NOTE — Progress Notes (Signed)
Patient presents to clinic with non-raised, rash on lower bilateral legs that does not itch for 1 week. Dr. Burr Medico notified. Ok to treat patient. Patient instructed to use cream per Dr. Burr Medico, patient verbalized understanding.

## 2015-05-24 NOTE — Patient Instructions (Signed)
[facility] Discharge Instructions for Patients Receiving Chemotherapy  Today you received the following chemotherapy agents Velcade To help prevent nausea and vomiting after your treatment, we encourage you to take your nausea medication as prescribed.   If you develop nausea and vomiting that is not controlled by your nausea medication, call the clinic.   BELOW ARE SYMPTOMS THAT SHOULD BE REPORTED IMMEDIATELY:  *FEVER GREATER THAN 100.5 F  *CHILLS WITH OR WITHOUT FEVER  NAUSEA AND VOMITING THAT IS NOT CONTROLLED WITH YOUR NAUSEA MEDICATION  *UNUSUAL SHORTNESS OF BREATH  *UNUSUAL BRUISING OR BLEEDING  TENDERNESS IN MOUTH AND THROAT WITH OR WITHOUT PRESENCE OF ULCERS  *URINARY PROBLEMS  *BOWEL PROBLEMS  UNUSUAL RASH Items with * indicate a potential emergency and should be followed up as soon as possible.  Feel free to call the clinic you have any questions or concerns. The clinic phone number is [phone].  Please show the [facility] at check-in to the Emergency Department and triage nurse.   Pamidronate injection (Aredia) What is this medicine? PAMIDRONATE (pa mi DROE nate) slows calcium loss from bones. It is used to treat high calcium blood levels from cancer or Paget's disease. It is also used to treat bone pain and prevent fractures from certain cancers that have spread to the bone. This medicine may be used for other purposes; ask your health care provider or pharmacist if you have questions. COMMON BRAND NAME(S): Aredia What should I tell my health care provider before I take this medicine? They need to know if you have any of these conditions: -aspirin-sensitive asthma -dental disease -kidney disease -an unusual or allergic reaction to pamidronate, other medicines, foods, dyes, or preservatives -pregnant or trying to get pregnant -breast-feeding How should I use this medicine? This medicine is for infusion into a vein. It is given by a  health care professional in a hospital or clinic setting. Talk to your pediatrician regarding the use of this medicine in children. This medicine is not approved for use in children. Overdosage: If you think you have taken too much of this medicine contact a poison control center or emergency room at once. NOTE: This medicine is only for you. Do not share this medicine with others. What if I miss a dose? This does not apply. What may interact with this medicine? -certain antibiotics given by injection -medicines for inflammation or pain like ibuprofen, naproxen -some diuretics like bumetanide, furosemide -cyclosporine -parathyroid hormone -tacrolimus -teriparatide -thalidomide This list may not describe all possible interactions. Give your health care provider a list of all the medicines, herbs, non-prescription drugs, or dietary supplements you use. Also tell them if you smoke, drink alcohol, or use illegal drugs. Some items may interact with your medicine. What should I watch for while using this medicine? Visit your doctor or health care professional for regular checkups. It may be some time before you see the benefit from this medicine. Do not stop taking your medicine unless your doctor tells you to. Your doctor may order blood tests or other tests to see how you are doing. Women should inform their doctor if they wish to become pregnant or think they might be pregnant. There is a potential for serious side effects to an unborn child. Talk to your health care professional or pharmacist for more information. You should make sure that you get enough calcium and vitamin D while you are taking this medicine. Discuss the foods you eat and the vitamins you take with  your health care professional. Some people who take this medicine have severe bone, joint, and/or muscle pain. This medicine may also increase your risk for a broken thigh bone. Tell your doctor right away if you have pain in your  upper leg or groin. Tell your doctor if you have any pain that does not go away or that gets worse. What side effects may I notice from receiving this medicine? Side effects that you should report to your doctor or health care professional as soon as possible: -allergic reactions like skin rash, itching or hives, swelling of the face, lips, or tongue -black or tarry stools -changes in vision -eye inflammation, pain -high blood pressure -jaw pain, especially burning or cramping -muscle weakness -numb, tingling pain -swelling of feet or hands -trouble passing urine or change in the amount of urine -unable to move easily Side effects that usually do not require medical attention (report to your doctor or health care professional if they continue or are bothersome): -bone, joint, or muscle pain -constipation -dizzy, drowsy -fever -headache -loss of appetite -nausea, vomiting -pain at site where injected This list may not describe all possible side effects. Call your doctor for medical advice about side effects. You may report side effects to FDA at 1-800-FDA-1088. Where should I keep my medicine? This drug is given in a hospital or clinic and will not be stored at home. NOTE: This sheet is a summary. It may not cover all possible information. If you have questions about this medicine, talk to your doctor, pharmacist, or health care provider.  2015, Elsevier/Gold Standard. (2011-06-12 08:49:49)

## 2015-05-28 ENCOUNTER — Telehealth: Payer: Self-pay | Admitting: Internal Medicine

## 2015-05-28 DIAGNOSIS — C9000 Multiple myeloma not having achieved remission: Secondary | ICD-10-CM | POA: Diagnosis not present

## 2015-05-28 MED ORDER — CLOPIDOGREL BISULFATE 75 MG PO TABS: 75.0000 mg | ORAL_TABLET | Freq: Every day | ORAL | Status: DC

## 2015-05-28 MED ORDER — SPIRONOLACTONE 25 MG PO TABS: 12.5000 mg | ORAL_TABLET | Freq: Every day | ORAL | Status: DC

## 2015-05-28 MED ORDER — METOPROLOL SUCCINATE ER 25 MG PO TB24: 12.5000 mg | ORAL_TABLET | Freq: Every day | ORAL | Status: DC

## 2015-05-28 NOTE — Telephone Encounter (Signed)
Calling to speak to someone about his medciations Toprol 25mg  and Plavix 75, aldactone 12.5mg  .. Need Refills on all these medication . Need new prescriptions sent to [facility] . Thanks

## 2015-05-28 NOTE — Telephone Encounter (Signed)
Returned call to patient 90 day refills sent to pharmacy.

## 2015-05-29 LAB — KAPPA/LAMBDA LIGHT CHAINS
KAPPA LAMBDA RATIO: 114.96 — AB (ref 0.26–1.65)
Kappa free light chain: 146 mg/dL — ABNORMAL HIGH (ref 0.33–1.94)
Lambda Free Lght Chn: 1.27 mg/dL (ref 0.57–2.63)

## 2015-05-29 LAB — SPEP & IFE WITH QIG
ABNORMAL PROTEIN BAND1: 0.5 g/dL
ALPHA-1-GLOBULIN: 0.4 g/dL — AB (ref 0.2–0.3)
Albumin ELP: 3.5 g/dL — ABNORMAL LOW (ref 3.8–4.8)
Alpha-2-Globulin: 1.2 g/dL — ABNORMAL HIGH (ref 0.5–0.9)
BETA 2: 0.2 g/dL (ref 0.2–0.5)
Beta Globulin: 0.4 g/dL (ref 0.4–0.6)
GAMMA GLOBULIN: 0.7 g/dL — AB (ref 0.8–1.7)
IGA: 14 mg/dL — AB (ref 68–379)
IGM, SERUM: 11 mg/dL — AB (ref 41–251)
IgG (Immunoglobin G), Serum: 979 mg/dL (ref 650–1600)
Total Protein, Serum Electrophoresis: 6.4 g/dL (ref 6.1–8.1)

## 2015-05-29 LAB — VITAMIN D 25 HYDROXY (VIT D DEFICIENCY, FRACTURES): VIT D 25 HYDROXY: 35 ng/mL (ref 30–100)

## 2015-05-30 ENCOUNTER — Ambulatory Visit: Payer: TRICARE For Life (TFL) | Admitting: Internal Medicine

## 2015-05-31 ENCOUNTER — Ambulatory Visit (HOSPITAL_BASED_OUTPATIENT_CLINIC_OR_DEPARTMENT_OTHER): Payer: Medicare Other | Admitting: Physician Assistant

## 2015-05-31 ENCOUNTER — Ambulatory Visit: Payer: TRICARE For Life (TFL)

## 2015-05-31 ENCOUNTER — Other Ambulatory Visit: Payer: TRICARE For Life (TFL)

## 2015-05-31 ENCOUNTER — Encounter: Payer: Self-pay | Admitting: Physician Assistant

## 2015-05-31 ENCOUNTER — Other Ambulatory Visit (HOSPITAL_BASED_OUTPATIENT_CLINIC_OR_DEPARTMENT_OTHER): Payer: Medicare Other

## 2015-05-31 ENCOUNTER — Other Ambulatory Visit: Payer: Self-pay | Admitting: Hematology and Oncology

## 2015-05-31 ENCOUNTER — Ambulatory Visit (HOSPITAL_BASED_OUTPATIENT_CLINIC_OR_DEPARTMENT_OTHER): Payer: Medicare Other

## 2015-05-31 ENCOUNTER — Telehealth: Payer: Self-pay | Admitting: Hematology

## 2015-05-31 VITALS — BP 106/66 | HR 94 | Temp 97.9°F | Resp 18 | Ht 68.5 in | Wt 180.2 lb

## 2015-05-31 DIAGNOSIS — C9000 Multiple myeloma not having achieved remission: Secondary | ICD-10-CM

## 2015-05-31 DIAGNOSIS — M810 Age-related osteoporosis without current pathological fracture: Secondary | ICD-10-CM

## 2015-05-31 DIAGNOSIS — G893 Neoplasm related pain (acute) (chronic): Secondary | ICD-10-CM | POA: Diagnosis not present

## 2015-05-31 DIAGNOSIS — Z5112 Encounter for antineoplastic immunotherapy: Secondary | ICD-10-CM

## 2015-05-31 LAB — CBC & DIFF AND RETIC
BASO%: 0.1 % (ref 0.0–2.0)
Basophils Absolute: 0 10*3/uL (ref 0.0–0.1)
EOS ABS: 0.2 10*3/uL (ref 0.0–0.5)
EOS%: 2.3 % (ref 0.0–7.0)
HCT: 46.1 % (ref 38.4–49.9)
HGB: 16.3 g/dL (ref 13.0–17.1)
Immature Retic Fract: 5.4 % (ref 3.00–10.60)
LYMPH#: 0.3 10*3/uL — AB (ref 0.9–3.3)
LYMPH%: 4.4 % — ABNORMAL LOW (ref 14.0–49.0)
MCH: 33.5 pg — AB (ref 27.2–33.4)
MCHC: 35.4 g/dL (ref 32.0–36.0)
MCV: 94.9 fL (ref 79.3–98.0)
MONO#: 0.7 10*3/uL (ref 0.1–0.9)
MONO%: 10.2 % (ref 0.0–14.0)
NEUT#: 5.7 10*3/uL (ref 1.5–6.5)
NEUT%: 83 % — AB (ref 39.0–75.0)
Platelets: 214 10*3/uL (ref 140–400)
RBC: 4.86 10*6/uL (ref 4.20–5.82)
RDW: 13.6 % (ref 11.0–14.6)
Retic %: 1.96 % — ABNORMAL HIGH (ref 0.80–1.80)
Retic Ct Abs: 95.26 10*3/uL — ABNORMAL HIGH (ref 34.80–93.90)
WBC: 6.9 10*3/uL (ref 4.0–10.3)

## 2015-05-31 LAB — COMPREHENSIVE METABOLIC PANEL (CC13)
ALT: 56 U/L — AB (ref 0–55)
ANION GAP: 8 meq/L (ref 3–11)
AST: 30 U/L (ref 5–34)
Albumin: 3.6 g/dL (ref 3.5–5.0)
Alkaline Phosphatase: 277 U/L — ABNORMAL HIGH (ref 40–150)
BUN: 7.1 mg/dL (ref 7.0–26.0)
CALCIUM: 8.4 mg/dL (ref 8.4–10.4)
CO2: 22 mEq/L (ref 22–29)
CREATININE: 1.3 mg/dL (ref 0.7–1.3)
Chloride: 104 mEq/L (ref 98–109)
EGFR: 59 mL/min/{1.73_m2} — AB (ref >90)
GLUCOSE: 94 mg/dL (ref 70–140)
POTASSIUM: 3.6 meq/L (ref 3.5–5.1)
SODIUM: 134 meq/L — AB (ref 136–145)
TOTAL PROTEIN: 7.1 g/dL (ref 6.4–8.3)
Total Bilirubin: 0.66 mg/dL (ref 0.20–1.20)

## 2015-05-31 MED ORDER — BORTEZOMIB CHEMO SQ INJECTION 3.5 MG (2.5MG/ML)
1.3000 mg/m2 | Freq: Once | INTRAMUSCULAR | Status: AC
  Administered 2015-05-31: 2.75 mg via SUBCUTANEOUS
  Filled 2015-05-31: qty 2.75

## 2015-05-31 MED ORDER — ONDANSETRON HCL 8 MG PO TABS
8.0000 mg | ORAL_TABLET | Freq: Once | ORAL | Status: AC
  Administered 2015-05-31: 8 mg via ORAL

## 2015-05-31 MED ORDER — ONDANSETRON HCL 8 MG PO TABS
ORAL_TABLET | ORAL | Status: AC
  Filled 2015-05-31: qty 1

## 2015-05-31 NOTE — Progress Notes (Signed)
Patient reports taking zofran at home around 9:30. Per Pharmacy, OK to give additional premed dose today in infusion if patient requests it.  Patient does want dose here today. Informed patient to wait at least 8 hours before taking zofran at home. He verbalizes understanding.

## 2015-05-31 NOTE — Telephone Encounter (Signed)
Gave and printed appt sched and avs for pt for June and July  °

## 2015-05-31 NOTE — Patient Instructions (Signed)
[facility] Discharge Instructions for Patients Receiving Chemotherapy  Today you received the following chemotherapy agent: Velcade   To help prevent nausea and vomiting after your treatment, we encourage you to take your nausea medication as prescribed.    If you develop nausea and vomiting that is not controlled by your nausea medication, call the clinic.   BELOW ARE SYMPTOMS THAT SHOULD BE REPORTED IMMEDIATELY:  *FEVER GREATER THAN 100.5 F  *CHILLS WITH OR WITHOUT FEVER  NAUSEA AND VOMITING THAT IS NOT CONTROLLED WITH YOUR NAUSEA MEDICATION  *UNUSUAL SHORTNESS OF BREATH  *UNUSUAL BRUISING OR BLEEDING  TENDERNESS IN MOUTH AND THROAT WITH OR WITHOUT PRESENCE OF ULCERS  *URINARY PROBLEMS  *BOWEL PROBLEMS  UNUSUAL RASH Items with * indicate a potential emergency and should be followed up as soon as possible.  Feel free to call the clinic you have any questions or concerns. The clinic phone number is [phone].  Please show the CHEMO ALERT CARD at check-in to the Emergency Department and triage nurse.   

## 2015-05-31 NOTE — Progress Notes (Signed)
[facility]  [phone] Fax:([phone]) [place] Note   Patient Care Team: [name], MD as PCP - General (Internal Medicine) 05/17/2015  CHIEF COMPLAINTS: Follow-up multiple myeloma    Multiple myeloma   01/31/2015 Tumor Marker SPEP showed M-SPIKE 1.7g/dl, Cr 1.5, no anemia, hypercalcemia   03/19/2015 Imaging Bone survey showed no discrete lytic lesion, but there is osteopenia and calvarial heterogeneity.   03/27/2015 Initial Diagnosis Multiple myeloma, stage I   03/27/2015 Bone Marrow Biopsy Bone Marrow, Aspirate,Biopsy: - HYPERCELLULAR BONE MARROW FOR AGE WITH PLASMA CELL NEOPLASM 47% - SEE COMMENT. PERIPHERAL BLOOD: - NO SIGNIFICANT MORPHOLOGIC ABNORMALITIES. Diagnosis Note The bone marrow shows increased number of    03/27/2015 Miscellaneous bone marrow Cytogenetics: normal. FISH panel showed the presence of +4 and +11, this is consider standard risk of MM    04/16/2015 - 04/23/2015 Hospital Admission He was admitted for worsening back pain, was found to have a T12 pathological fracture. He underwent kyphoplasty on 04/19/2015.   04/19/2015 -  Chemotherapy RVD with weekly Velcade 1.3 mg/m, dexamethasone 40 mg, and Revlimid 10 mg daily on day 1-21, every 28 days   04/26/2015 - 05/01/2015 Hospital Admission He was admitted to [facility] for syncope episode during his radiation simulation. EKG showed ST elevation, troponin was positive, he underwent cardio catheterization which showed mild stenosis. No intervention was needed. echo showed EF 35-40%   04/29/2015 - 05/10/2015 Radiation Therapy palliative RT to T12, 20Gy in 10 fractions     HISTORY OF PRESENTING ILLNESS:  Eric Dudley 64 y.o. 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 He initially presented because of back pain and abnormal SPEP.  He had been having low back pain for 3-4 months. He had food posinin 4 month  ago, and had frequent diarrhea. He started noticed low back pain since then. The pain is not radiating to leg, he also has intermittent right rib pain, worse with cough and sneezing. He remains to be physically active, able to do all the activities, such as gardening work housework without much limitation, but he doesn't sings slowly because of back pain. He takes Tylenol as needed, does not like the necrotic pain medication. No night sweats, no fever or chillss, no weight loss.   He was seen by his primary care physician Dr. [name], MD and had lab test (see below). He also had bone density scan 2/25 which showed osteoporosis, has not been treated yet.  Current therapy: Weekly Velcade 1.3 mg/m and dexamethasone 40 mg, Revlimid 10 mg daily on day 1-21, every 28 days, started on 04/19/2015, Revlimid was held in the middle of the first cycle due to multiple hospitalization and radiation.   INTERIM HISTORY: Mr. Rodeheaver returns for follow-up. He finished radiation on 05/10/2015. He has been released from the rehabilitation. He has restarted Revlimid on May 16. He is here for weekly cycle 8 bortezomib. His back pain has significantly improved, it resolved after the radiation. He still has persistent bilateral lateral rib pain, for which he takes Tylenol and ibuprofen. He has had some cold symptoms and noticed increased shortness of breath and fatigue with these symptoms. He continues to have dry mouth however this is been issue for him for the past 10 years after he had radiation therapy. He reports a couple of falls, once on his stomach and once on his back. Both episodes happened after he changed position and  an started ambulating. He states that he sees a flash of white light and then falls. He denies any head trauma. He has good energy and appetite, no other new complaints. No fever or chills. No neuropathy.   MEDICAL HISTORY:  Past Medical History  Diagnosis Date  . Hypertension   .  Dyslipidemia   . Tonsillar cancer 2006  . Cholelithiases   . Hyperlipidemia   . Chronic kidney disease     MILD,CHRONIC    SURGICAL HISTORY: Past Surgical History  Procedure Laterality Date  . Appendectomy  1962  . Shoulder surgery  1999    left  . Knee surgery  2012    right  . Cardiac catheterization N/A 04/30/2015    Procedure: Left Heart Cath and Coronary Angiography;  Surgeon: Peter M Martinique, MD;  Location: [facility] INVASIVE CV LAB CUPID;  Service: Cardiovascular;  Laterality: N/A;  . Cardiolite myocardial perfusion study  04/16/03    NEGITIVE BRUCE PROTOCAL EXERCISE STRESS TEST. EF 70%. NO ISCHEMIA.    SOCIAL HISTORY: History   Social History  . Marital Status: Married    Spouse Name: N/A  . Number of Children: 1   . Years of Education: N/A   Occupational History  . A retired Therapist, occupational    Social History Main Topics  . Smoking status: Former Smoker    Types: Cigars    Quit date: 12/29/2003  . Smokeless tobacco: Former Systems developer    Types: Chew    Quit date: 12/28/1994  . Alcohol Use: Yes     Comment: occasional beer  . Drug Use: No  . Sexual Activity: Not on file   Other Topics Concern  . Not on file   Social History Narrative  . No narrative on file    FAMILY HISTORY: Family History  Problem Relation Age of Onset  . Cancer Mother 91    cervical cancer   . Cancer Father     unknown cancer   . Hypertension Sister   . Cancer Sister     melanoma     ALLERGIES:  is allergic to hydrochlorothiazide; asa; hydrocodone; amoxicillin; and indapamide.  MEDICATIONS:  Current Outpatient Prescriptions  Medication Sig Dispense Refill  . acetaminophen (TYLENOL) 325 MG tablet Take 2 tablets (650 mg total) by mouth every 6 (six) hours as needed for mild pain, fever or headache.    [name] acyclovir (ZOVIRAX) 400 MG tablet Take 1 tablet (400 mg total) by mouth 2 (two) times daily. 180 tablet 1  . aspirin 81 MG chewable tablet Chew 1 tablet (81 mg total) by mouth daily.    .  Bisacodyl (DULCOLAX PO) Take 3 capsules by mouth daily as needed (for constipation).     . BORTEZOMIB IJ Inject as directed once a week.    . calcium carbonate (OS-CAL) 600 MG TABS tablet Take 1,200 mg by mouth daily with breakfast.    . Cholecalciferol (VITAMIN D-3) 1000 UNITS CAPS Take 1,000 Units by mouth daily.    . clopidogrel (PLAVIX) 75 MG tablet Take 1 tablet (75 mg total) by mouth daily. 90 tablet 3  . cyclobenzaprine (FLEXERIL) 5 MG tablet Take 1 tablet (5 mg total) by mouth 3 (three) times daily. 30 tablet 0  . dexamethasone (DECADRON) 4 MG tablet Take 10 tablets (40 mg total) by mouth once a week. Take 40 mg ( 10 tablets )  Once  A  Week  On  Day  Of  Velcade. 120 tablet 1  . ezetimibe-simvastatin (VYTORIN)  10-40 MG per tablet Take 1 tablet by mouth at bedtime.    . hydrOXYzine (VISTARIL) 25 MG capsule Take 25 mg by mouth as needed.    [name] lenalidomide (REVLIMID) 10 MG capsule Take 1 capsule (10 mg total) by mouth daily. Take for 21 days and rest 7 days. ICD-10 is C90.00. Started mon 05/13/15 21 capsule 0  . metoprolol succinate (TOPROL-XL) 25 MG 24 hr tablet Take 0.5 tablets (12.5 mg total) by mouth daily. 45 tablet 3  . Multiple Vitamins-Minerals (CENTRUM SILVER ULTRA MENS PO) Take 1 tablet by mouth daily.    . nitroGLYCERIN (NITROSTAT) 0.4 MG SL tablet Place 0.4 mg under the tongue every 5 (five) minutes as needed for chest pain.    [name] ondansetron (ZOFRAN) 8 MG tablet Take 1 tablet (8 mg total) by mouth 2 (two) times daily as needed for nausea or vomiting. 60 tablet 1  . pamidronate (AREDIA) 30 MG injection Inject into the vein every 28 (twenty-eight) days.    . polyethylene glycol (MIRALAX / GLYCOLAX) packet Take 17 g by mouth 2 (two) times daily.    . potassium chloride SA (K-DUR,KLOR-CON) 20 MEQ tablet Take 40 mEq by mouth 2 (two) times daily.    . ranitidine (ZANTAC) 150 MG tablet Take 2 tablets (300 mg total) by mouth at bedtime. 60 tablet 1  . sennosides-docusate sodium (SENOKOT-S)  8.6-50 MG tablet Take 2 tablets by mouth 2 (two) times daily.    [name] spironolactone (ALDACTONE) 25 MG tablet Take 0.5 tablets (12.5 mg total) by mouth daily. 45 tablet 3   No current facility-administered medications for this visit.    REVIEW OF SYSTEMS:   Constitutional: Denies fevers, chills or abnormal night sweats Eyes: Denies blurriness of vision, double vision or watery eyes Ears, nose, mouth, throat, and face: Denies mucositis or sore throat Respiratory: Denies cough, dyspnea or wheezes Cardiovascular: Denies palpitation, chest discomfort or lower extremity swelling Gastrointestinal:  Denies nausea, heartburn or change in bowel habits Skin: Denies abnormal skin rashes Lymphatics: Denies new lymphadenopathy or easy bruising Neurological:Denies numbness, tingling or new weaknesses Behavioral/Psych: Mood is stable, no new changes  Musculoskeletal: Positive for back pain and intermittent rib pain All other systems were reviewed with the patient and are negative.  PHYSICAL EXAMINATION: ECOG PERFORMANCE STATUS: 1 - Symptomatic but completely ambulatory BP 106/66 mmHg  Pulse 94  Temp(Src) 97.9 F (36.6 C) (Oral)  Resp 18  Ht 5' 8.5" (1.74 m)  Wt 180 lb 3.2 oz (81.738 kg)  BMI 27.00 kg/m2  SpO2 99% orthostatic blood pressure readings did not show a significant drop. GENERAL: Today on the table, drowsy, arousable, no distress and comfortable SKIN: skin color, texture, turgor are normal, no rashes or significant lesions EYES: normal, conjunctiva are pink and non-injected, sclera clear OROPHARYNX:no exudate, no erythema and lips, buccal mucosa, and tongue normal  NECK: supple, thyroid normal size, non-tender, without nodularity LYMPH:  no palpable lymphadenopathy in the cervical, axillary or inguinal LUNGS: clear to auscultation and percussion with normal breathing effort HEART: regular rate & rhythm and no murmurs and no lower extremity edema ABDOMEN:abdomen soft, non-tender and  normal bowel sounds Musculoskeletal:no cyanosis of digits and no clubbing, mild tenderness on bilateral lateral chest wall  PSYCH: alert & oriented x 3 with fluent speech NEURO: no focal motor/sensory deficits  LABORATORY DATA:   CBC Latest Ref Rng 05/31/2015 05/24/2015 05/17/2015  WBC 4.0 - 10.3 10e3/uL 6.9 12.5(H) 8.9  Hemoglobin 13.0 - 17.1 g/dL 16.3 16.4 15.9  Hematocrit  38.4 - 49.9 % 46.1 47.5 46.6  Platelets 140 - 400 10e3/uL 214 166 231   CMP Latest Ref Rng 05/31/2015 05/24/2015 05/17/2015  Glucose 70 - 140 mg/dl 94 117 94  BUN 7.0 - 26.0 mg/dL 7.1 8.7 10.2  Creatinine 0.7 - 1.3 mg/dL 1.3 1.4(H) 1.3  Sodium 136 - 145 mEq/L 134(L) 133(L) 137  Potassium 3.5 - 5.1 mEq/L 3.6 3.7 3.3(L)  Chloride 101 - 111 mmol/L - - -  CO2 22 - 29 mEq/L 22 16(L) 17(L)  Calcium 8.4 - 10.4 mg/dL 8.4 8.9 8.9  Total Protein 6.4 - 8.3 g/dL 7.1 6.8 6.8  Total Bilirubin 0.20 - 1.20 mg/dL 0.66 0.57 0.67  Alkaline Phos 40 - 150 U/L 277(H) 323(H) 420(H)  AST 5 - 34 U/L 30 40(H) 33  ALT 0 - 55 U/L 56(H) 67(H) 46     PATHOLOGY REPORT Bone Marrow, Aspirate,Biopsy, and Clot, right iliac 03/27/2015 - HYPERCELLULAR BONE MARROW FOR AGE WITH PLASMA CELL NEOPLASM. - SEE COMMENT. PERIPHERAL BLOOD: - NO SIGNIFICANT MORPHOLOGIC ABNORMALITIES. Diagnosis Note The bone marrow shows increased number of atypical plasma cells representing 47% of all cells in the aspirate associated with prominent interstitial infiltrates and variably sized aggregates in the clot and biopsy sections. Immunohistochemical stains show that the plasma cells are kappa light chain restricted consistent with plasma cell neoplasm. The background shows trilineage  Cytogenetics: normal FISH panel: +4 and +11  RADIOGRAPHIC STUDIES: Bone survey 03/19/2015 IMPRESSION: No discrete lytic lesion, but there is osteopenia and calvarial heterogeneity -nonspecific findings which can be manifestations of Myeloma  ASSESSMENT & PLAN:  64 year old  Caucasian male with past medical history of hypertension, tonsil cancer status post surgery and radiation 10 years ago, now presented with 3-4 months low back pain and intermittent rib pain. His blood test showed M protein 1.7g/dl.  1. IgG multiple myeloma, stage I, standard risk by cytogenetics (+4 and +11) -I discussed his bone marrow biopsy results with him. He has significant amount of plasma cells in the bone marrow 47%.  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 Based on the new notable myeloma diagnostic criteria, he meets the criteria of multiple myeloma. He has normal albumin and miildly elevated beta 2 microglobulin, this is stage I. -His cytogenetics and Fish panel revealed standard risk. -His thoracic and lumbar spine MRI showed pathologic compression fracture at T12, focal myeloma deposits at L3 and T9, diffuse marrow signal abnormality consistent with myeloma -I recommended induction chemotherapy with VRD regimen, weekly Velcade and dexamethasone, the Revlimid dose will be 10 mg daily 3 week on, one-week off, based on his renal function. -The goal of treatment is disease control, potential cure with bone marrow transplant -He is a candidate for bone marrow transplant. I'll refer him to [facility] for bone marrow transplant evaluation -Continue week 6 treatment, continue Revlimid as second cycle.  -His PET scan has been canceled due to his hospitalization. We'll reschedule.   2. T12 pathologic compression fracture, and diffuse myeloma involvement in bones -Status post kyphoplasty and palliative radiation  -He has started palliative Radiation, pain much improved  - pamidronate every 4 weeks, first treatment given  05/24/2015    3. 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 He is on aspirin -follow up with Dr. Percival Spanish   Follow up: -continue VRD -Continue pamidronate every 4 weeks  -He will see APP or Dr. Burr Medico every 2 weeks   All questions were answered. The patient knows to call the clinic with any problems, questions or concerns.  I spent 25 minutes counseling the patient face to face. The total time spent in the appointment was 30 minutes and more than 50% was on counseling.     Carlton Adam, PA-C 05/17/2015  2:57 PM

## 2015-06-01 NOTE — Patient Instructions (Signed)
Continue labs and chemotherapy as scheduled  Follow up in 2 weeks 

## 2015-06-03 LAB — UPEP/TP, 24-HR URINE
Albumin: 26.1 %
Alpha-1-Globulin, U: 27.8 %
Alpha-2-Globulin, U: 15.2 %
Beta Globulin, U: 12.2 %
Collection Interval: 24 hours
GAMMA GLOBULIN, U: 18.7 %
MONOCLONAL BAND 1: 0.6 %
MONOCLONAL BAND 2: 0.2 %
TOTAL PROTEIN, URINE/DAY: 4160 mg/d — AB (ref 50–100)
TOTAL PROTEIN, URINE: 160 mg/dL
TOTAL VOLUME, URINE: 2600 mL

## 2015-06-03 LAB — UIFE/LIGHT CHAINS/TP QN, 24-HR UR
ALPHA 2 UR: DETECTED — AB
Albumin, U: DETECTED
Alpha 1, Urine: DETECTED — AB
Beta, Urine: DETECTED — AB
GAMMA UR: DETECTED — AB
TOTAL PROTEIN, URINE-UPE24: 160 mg/dL — AB (ref 5–25)
Time: 24 hours
Total Protein, Urine-Ur/day: 4160 mg/d — ABNORMAL HIGH (ref <150)
VOLUME, URINE-UPE24: 2600 mL

## 2015-06-03 LAB — 24 HR URINE,KAPPA/LAMBDA LIGHT CHAINS
24H Urine Volume: 2600 mL/24 h
MEASURED LAMBDA CHAIN: 1.55 mg/dL (ref <2.00)
Measured Kappa Chain: 22.4 mg/dL — ABNORMAL HIGH (ref <2.00)
Total Kappa Chain: 582.4 mg/24 h
Total Lambda Chain: 40.3 mg/24 h

## 2015-06-05 DIAGNOSIS — Z885 Allergy status to narcotic agent status: Secondary | ICD-10-CM | POA: Diagnosis not present

## 2015-06-05 DIAGNOSIS — Z9089 Acquired absence of other organs: Secondary | ICD-10-CM | POA: Diagnosis not present

## 2015-06-05 DIAGNOSIS — Z880 Allergy status to penicillin: Secondary | ICD-10-CM | POA: Diagnosis not present

## 2015-06-05 DIAGNOSIS — I10 Essential (primary) hypertension: Secondary | ICD-10-CM | POA: Diagnosis not present

## 2015-06-05 DIAGNOSIS — Z79899 Other long term (current) drug therapy: Secondary | ICD-10-CM | POA: Diagnosis not present

## 2015-06-05 DIAGNOSIS — Z7901 Long term (current) use of anticoagulants: Secondary | ICD-10-CM | POA: Diagnosis not present

## 2015-06-05 DIAGNOSIS — Z9221 Personal history of antineoplastic chemotherapy: Secondary | ICD-10-CM | POA: Diagnosis not present

## 2015-06-05 DIAGNOSIS — E785 Hyperlipidemia, unspecified: Secondary | ICD-10-CM | POA: Diagnosis not present

## 2015-06-05 DIAGNOSIS — Z87891 Personal history of nicotine dependence: Secondary | ICD-10-CM | POA: Diagnosis not present

## 2015-06-05 DIAGNOSIS — Z923 Personal history of irradiation: Secondary | ICD-10-CM | POA: Diagnosis not present

## 2015-06-05 DIAGNOSIS — C9000 Multiple myeloma not having achieved remission: Secondary | ICD-10-CM | POA: Diagnosis not present

## 2015-06-05 DIAGNOSIS — M810 Age-related osteoporosis without current pathological fracture: Secondary | ICD-10-CM | POA: Diagnosis not present

## 2015-06-05 DIAGNOSIS — Z7982 Long term (current) use of aspirin: Secondary | ICD-10-CM | POA: Diagnosis not present

## 2015-06-05 DIAGNOSIS — Z888 Allergy status to other drugs, medicaments and biological substances status: Secondary | ICD-10-CM | POA: Diagnosis not present

## 2015-06-05 NOTE — Progress Notes (Signed)
Eric Dudley here for follow up. He denies pain.  He reports having occasional spasms in his lower back.  He reports he is getting over a bad cold and had a sore throat.  He denies problems swallowing.  He denies having nausea.  He is here in a wheelchair today but uses a cane to ambulate at home.  The skin on his back is dry and peeling.  He is taking revlimed, decadron and also received a velcade injection today.  He reports fatigue with activity.  BP 121/66 mmHg  Pulse 71  Temp(Src) 97.8 F (36.6 C) (Oral)  Resp 16  Ht 5' 8.5" (1.74 m)  Wt 183 lb 3.2 oz (83.099 kg)  BMI 27.45 kg/m2  SpO2 98%

## 2015-06-07 ENCOUNTER — Encounter: Payer: Self-pay | Admitting: Radiation Oncology

## 2015-06-07 ENCOUNTER — Telehealth: Payer: Self-pay | Admitting: *Deleted

## 2015-06-07 ENCOUNTER — Other Ambulatory Visit (HOSPITAL_BASED_OUTPATIENT_CLINIC_OR_DEPARTMENT_OTHER): Payer: Medicare Other

## 2015-06-07 ENCOUNTER — Ambulatory Visit
Admission: RE | Admit: 2015-06-07 | Discharge: 2015-06-07 | Disposition: A | Discharge status: Home / Self Care | Payer: Medicare Other | Source: Ambulatory Visit | Attending: Radiation Oncology | Admitting: Radiation Oncology

## 2015-06-07 ENCOUNTER — Ambulatory Visit (HOSPITAL_BASED_OUTPATIENT_CLINIC_OR_DEPARTMENT_OTHER): Payer: Medicare Other

## 2015-06-07 VITALS — BP 118/80 | HR 84 | Temp 97.1°F | Resp 18

## 2015-06-07 VITALS — BP 121/66 | HR 71 | Temp 97.8°F | Resp 16 | Ht 68.5 in | Wt 183.2 lb

## 2015-06-07 DIAGNOSIS — C9000 Multiple myeloma not having achieved remission: Secondary | ICD-10-CM | POA: Diagnosis present

## 2015-06-07 DIAGNOSIS — Z5112 Encounter for antineoplastic immunotherapy: Secondary | ICD-10-CM | POA: Diagnosis present

## 2015-06-07 LAB — CBC & DIFF AND RETIC
BASO%: 0.6 % (ref 0.0–2.0)
Basophils Absolute: 0 10*3/uL (ref 0.0–0.1)
EOS%: 10 % — ABNORMAL HIGH (ref 0.0–7.0)
Eosinophils Absolute: 0.5 10*3/uL (ref 0.0–0.5)
HEMATOCRIT: 43.5 % (ref 38.4–49.9)
HGB: 15 g/dL (ref 13.0–17.1)
IMMATURE RETIC FRACT: 5.7 % (ref 3.00–10.60)
LYMPH#: 0.4 10*3/uL — AB (ref 0.9–3.3)
LYMPH%: 9 % — ABNORMAL LOW (ref 14.0–49.0)
MCH: 33.3 pg (ref 27.2–33.4)
MCHC: 34.5 g/dL (ref 32.0–36.0)
MCV: 96.7 fL (ref 79.3–98.0)
MONO#: 0.9 10*3/uL (ref 0.1–0.9)
MONO%: 19 % — ABNORMAL HIGH (ref 0.0–14.0)
NEUT#: 2.9 10*3/uL (ref 1.5–6.5)
NEUT%: 61.4 % (ref 39.0–75.0)
Platelets: 170 10*3/uL (ref 140–400)
RBC: 4.5 10*6/uL (ref 4.20–5.82)
RDW: 14.2 % (ref 11.0–14.6)
Retic %: 3.01 % — ABNORMAL HIGH (ref 0.80–1.80)
Retic Ct Abs: 135.45 10*3/uL — ABNORMAL HIGH (ref 34.80–93.90)
WBC: 4.7 10*3/uL (ref 4.0–10.3)

## 2015-06-07 LAB — COMPREHENSIVE METABOLIC PANEL (CC13)
ALBUMIN: 3.3 g/dL — AB (ref 3.5–5.0)
ALT: 60 U/L — AB (ref 0–55)
ANION GAP: 10 meq/L (ref 3–11)
AST: 32 U/L (ref 5–34)
Alkaline Phosphatase: 264 U/L — ABNORMAL HIGH (ref 40–150)
BUN: 7.3 mg/dL (ref 7.0–26.0)
CALCIUM: 8.1 mg/dL — AB (ref 8.4–10.4)
CO2: 16 meq/L — AB (ref 22–29)
Chloride: 108 mEq/L (ref 98–109)
Creatinine: 1.1 mg/dL (ref 0.7–1.3)
EGFR: 67 mL/min/{1.73_m2} — ABNORMAL LOW (ref >90)
Glucose: 131 mg/dl (ref 70–140)
Potassium: 3.2 mEq/L — ABNORMAL LOW (ref 3.5–5.1)
SODIUM: 134 meq/L — AB (ref 136–145)
Total Bilirubin: 0.58 mg/dL (ref 0.20–1.20)
Total Protein: 6.1 g/dL — ABNORMAL LOW (ref 6.4–8.3)

## 2015-06-07 MED ORDER — POTASSIUM CHLORIDE CRYS ER 20 MEQ PO TBCR: 60.0000 meq | EXTENDED_RELEASE_TABLET | Freq: Every day | ORAL | Status: DC

## 2015-06-07 MED ORDER — ONDANSETRON HCL 8 MG PO TABS
ORAL_TABLET | ORAL | Status: AC
  Filled 2015-06-07: qty 1

## 2015-06-07 MED ORDER — ONDANSETRON HCL 8 MG PO TABS
8.0000 mg | ORAL_TABLET | Freq: Once | ORAL | Status: AC
  Administered 2015-06-07: 8 mg via ORAL

## 2015-06-07 MED ORDER — BORTEZOMIB CHEMO SQ INJECTION 3.5 MG (2.5MG/ML)
1.3000 mg/m2 | Freq: Once | INTRAMUSCULAR | Status: AC
  Administered 2015-06-07: 2.75 mg via SUBCUTANEOUS
  Filled 2015-06-07: qty 2.75

## 2015-06-07 NOTE — Telephone Encounter (Signed)
Per Infusion room RN/[facility], pt is taking 2 K+ tabs 20 meq daily & K+ is low.  Informed pt per Dr Burr Medico to increase K+ to tid.  Pt expressed understanding & will need refill soon to [facility].  This will be done.

## 2015-06-07 NOTE — Patient Instructions (Signed)

## 2015-06-07 NOTE — Progress Notes (Signed)
Radiation Oncology         [phone] ________________________________  Name: Eric Dudley MRN: [id number]  Date: 06/07/2015  DOB: 1951-06-28  Follow-Up Visit Note  outpatient  CC: [name], MD  Nita Sells, MD  Diagnosis and Prior Radiotherapy:    ICD-9-CM ICD-10-CM   1. Multiple myeloma 203.00 C90.00    Metastatic Multiple myeloma to spine C90.00   Indication for treatment:  palliative      Radiation treatment dates:   04/29/2015-05/10/2015 Site/dose:  T11-L1 spine / 20 Gy in 10 fractions  Narrative:  The patient returns today for routine follow-up. He denies pain at this time. He has diffuse rib pain that lasts momentarily when he moves. He reports having occasional spasms in his lower back. He reports he is getting over a bad cold and had a sore throat. He denies problems swallowing. He denies having nausea. He is here in a wheelchair today, but uses a cane to get up and down from chairs, but is able to walk fine after that. The pt notes the skin on his back is dry and peeling. He is taking revlimed, decadron and also received a velcade injection today. He reports fatigue with activity. Overall, feels better than at diagnosis. Walks with or without cane.                               ALLERGIES:  is allergic to hydrochlorothiazide; asa; hydrocodone; amoxicillin; and indapamide.  Meds: Current Outpatient Prescriptions  Medication Sig Dispense Refill  . acetaminophen (TYLENOL) 325 MG tablet Take 2 tablets (650 mg total) by mouth every 6 (six) hours as needed for mild pain, fever or headache.    [name] acyclovir (ZOVIRAX) 400 MG tablet Take 1 tablet (400 mg total) by mouth 2 (two) times daily. 180 tablet 1  . aspirin 81 MG chewable tablet Chew 1 tablet (81 mg total) by mouth daily.    . Bisacodyl (DULCOLAX PO) Take 3 capsules by mouth daily as needed (for constipation).     . BORTEZOMIB IJ Inject as directed once a week.    . calcium carbonate (OS-CAL) 600 MG TABS  tablet Take 1,200 mg by mouth daily with breakfast.    . Cholecalciferol (VITAMIN D-3) 1000 UNITS CAPS Take 1,000 Units by mouth daily.    . clopidogrel (PLAVIX) 75 MG tablet Take 1 tablet (75 mg total) by mouth daily. 90 tablet 3  . cyclobenzaprine (FLEXERIL) 5 MG tablet Take 1 tablet (5 mg total) by mouth 3 (three) times daily. 30 tablet 0  . dexamethasone (DECADRON) 4 MG tablet Take 10 tablets (40 mg total) by mouth once a week. Take 40 mg ( 10 tablets )  Once  A  Week  On  Day  Of  Velcade. 120 tablet 1  . ezetimibe-simvastatin (VYTORIN) 10-40 MG per tablet Take 1 tablet by mouth at bedtime.    [name] lenalidomide (REVLIMID) 10 MG capsule Take 1 capsule (10 mg total) by mouth daily. Take for 21 days and rest 7 days. ICD-10 is C90.00. Started mon 05/13/15 21 capsule 0  . metoprolol succinate (TOPROL-XL) 25 MG 24 hr tablet Take 0.5 tablets (12.5 mg total) by mouth daily. 45 tablet 3  . Multiple Vitamins-Minerals (CENTRUM SILVER ULTRA MENS PO) Take 1 tablet by mouth daily.    . ondansetron (ZOFRAN) 8 MG tablet Take 1 tablet (8 mg total) by mouth 2 (two) times daily as needed for nausea or  vomiting. 60 tablet 1  . pamidronate (AREDIA) 30 MG injection Inject into the vein every 28 (twenty-eight) days.    . polyethylene glycol (MIRALAX / GLYCOLAX) packet Take 17 g by mouth 2 (two) times daily.    . potassium chloride SA (K-DUR,KLOR-CON) 20 MEQ tablet Take 40 mEq by mouth 2 (two) times daily.    . ranitidine (ZANTAC) 150 MG tablet Take 2 tablets (300 mg total) by mouth at bedtime. 60 tablet 1  . sennosides-docusate sodium (SENOKOT-S) 8.6-50 MG tablet Take 2 tablets by mouth 2 (two) times daily.    [name] spironolactone (ALDACTONE) 25 MG tablet Take 0.5 tablets (12.5 mg total) by mouth daily. 45 tablet 3  . hydrOXYzine (VISTARIL) 25 MG capsule Take 25 mg by mouth as needed.    . nitroGLYCERIN (NITROSTAT) 0.4 MG SL tablet Place 0.4 mg under the tongue every 5 (five) minutes as needed for chest pain.     No  current facility-administered medications for this encounter.    Physical Findings: The patient is in no acute distress. Patient is alert and oriented.  height is 5' 8.5" (1.74 m) and weight is 183 lb 3.2 oz (83.099 kg). His oral temperature is 97.8 F (36.6 C). His blood pressure is 121/66 and his pulse is 71. His respiration is 16 and oxygen saturation is 98%.  Skin on his back is dry and there is no hyperpigmentation in the treatment field.   Lab Findings: Lab Results  Component Value Date   WBC 4.7 06/07/2015   HGB 15.0 06/07/2015   HCT 43.5 06/07/2015   MCV 96.7 06/07/2015   PLT 170 06/07/2015    Radiographic Findings: No results found.  Impression/Plan: I will release him to med/onc. He know he may see me on a prn basis in the future. He has done well symptomatically with the treatment thus far.   This document serves as a record of services personally performed by Eppie Gibson, MD. It was created on her behalf by Darcus Austin, a trained medical scribe. The creation of this record is based on the scribe's personal observations and the provider's statements to them. This document has been checked and approved by the attending provider.     _____________________________________   Eppie Gibson, MD

## 2015-06-12 DIAGNOSIS — I10 Essential (primary) hypertension: Secondary | ICD-10-CM | POA: Diagnosis not present

## 2015-06-12 DIAGNOSIS — N183 Chronic kidney disease, stage 3 (moderate): Secondary | ICD-10-CM | POA: Diagnosis not present

## 2015-06-13 ENCOUNTER — Other Ambulatory Visit: Payer: Self-pay | Admitting: Hematology

## 2015-06-14 ENCOUNTER — Telehealth: Payer: Self-pay | Admitting: *Deleted

## 2015-06-14 ENCOUNTER — Other Ambulatory Visit: Payer: Self-pay | Admitting: *Deleted

## 2015-06-14 ENCOUNTER — Ambulatory Visit (HOSPITAL_BASED_OUTPATIENT_CLINIC_OR_DEPARTMENT_OTHER): Payer: Medicare Other

## 2015-06-14 ENCOUNTER — Ambulatory Visit (HOSPITAL_BASED_OUTPATIENT_CLINIC_OR_DEPARTMENT_OTHER): Payer: Medicare Other | Admitting: Nurse Practitioner

## 2015-06-14 ENCOUNTER — Other Ambulatory Visit (HOSPITAL_BASED_OUTPATIENT_CLINIC_OR_DEPARTMENT_OTHER): Payer: Medicare Other

## 2015-06-14 VITALS — BP 125/71 | HR 80 | Temp 98.0°F | Resp 19 | Ht 68.5 in | Wt 182.9 lb

## 2015-06-14 DIAGNOSIS — C9000 Multiple myeloma not having achieved remission: Secondary | ICD-10-CM

## 2015-06-14 DIAGNOSIS — G893 Neoplasm related pain (acute) (chronic): Secondary | ICD-10-CM

## 2015-06-14 DIAGNOSIS — Z5112 Encounter for antineoplastic immunotherapy: Secondary | ICD-10-CM

## 2015-06-14 LAB — CBC & DIFF AND RETIC
BASO%: 0.2 % (ref 0.0–2.0)
Basophils Absolute: 0 10*3/uL (ref 0.0–0.1)
EOS%: 0.4 % (ref 0.0–7.0)
Eosinophils Absolute: 0 10*3/uL (ref 0.0–0.5)
HCT: 42.7 % (ref 38.4–49.9)
HGB: 14.4 g/dL (ref 13.0–17.1)
Immature Retic Fract: 13.9 % — ABNORMAL HIGH (ref 3.00–10.60)
LYMPH%: 4.7 % — AB (ref 14.0–49.0)
MCH: 33.2 pg (ref 27.2–33.4)
MCHC: 33.7 g/dL (ref 32.0–36.0)
MCV: 98.4 fL — ABNORMAL HIGH (ref 79.3–98.0)
MONO#: 1.3 10*3/uL — AB (ref 0.1–0.9)
MONO%: 15.4 % — ABNORMAL HIGH (ref 0.0–14.0)
NEUT#: 6.5 10*3/uL (ref 1.5–6.5)
NEUT%: 79.3 % — AB (ref 39.0–75.0)
Platelets: 203 10*3/uL (ref 140–400)
RBC: 4.34 10*6/uL (ref 4.20–5.82)
RDW: 14.4 % (ref 11.0–14.6)
Retic %: 2.36 % — ABNORMAL HIGH (ref 0.80–1.80)
Retic Ct Abs: 102.42 10*3/uL — ABNORMAL HIGH (ref 34.80–93.90)
WBC: 8.1 10*3/uL (ref 4.0–10.3)
lymph#: 0.4 10*3/uL — ABNORMAL LOW (ref 0.9–3.3)

## 2015-06-14 LAB — COMPREHENSIVE METABOLIC PANEL (CC13)
ALBUMIN: 3.4 g/dL — AB (ref 3.5–5.0)
ALT: 36 U/L (ref 0–55)
ANION GAP: 6 meq/L (ref 3–11)
AST: 22 U/L (ref 5–34)
Alkaline Phosphatase: 281 U/L — ABNORMAL HIGH (ref 40–150)
BILIRUBIN TOTAL: 0.55 mg/dL (ref 0.20–1.20)
BUN: 6.4 mg/dL — ABNORMAL LOW (ref 7.0–26.0)
CHLORIDE: 111 meq/L — AB (ref 98–109)
CO2: 19 meq/L — AB (ref 22–29)
Calcium: 8.5 mg/dL (ref 8.4–10.4)
Creatinine: 1.2 mg/dL (ref 0.7–1.3)
EGFR: 66 mL/min/{1.73_m2} — ABNORMAL LOW (ref >90)
GLUCOSE: 99 mg/dL (ref 70–140)
POTASSIUM: 4.4 meq/L (ref 3.5–5.1)
SODIUM: 136 meq/L (ref 136–145)
TOTAL PROTEIN: 6.3 g/dL — AB (ref 6.4–8.3)

## 2015-06-14 MED ORDER — LENALIDOMIDE 25 MG PO CAPS
25.0000 mg | ORAL_CAPSULE | Freq: Every day | ORAL | Status: DC
Start: 2015-06-14 — End: 2015-07-05

## 2015-06-14 MED ORDER — BORTEZOMIB CHEMO SQ INJECTION 3.5 MG (2.5MG/ML)
1.3000 mg/m2 | Freq: Once | INTRAMUSCULAR | Status: AC
  Administered 2015-06-14: 2.75 mg via SUBCUTANEOUS
  Filled 2015-06-14: qty 2.75

## 2015-06-14 MED ORDER — CYCLOBENZAPRINE HCL 5 MG PO TABS: 5.0000 mg | ORAL_TABLET | Freq: Three times a day (TID) | ORAL | Status: DC

## 2015-06-14 MED ORDER — ONDANSETRON HCL 8 MG PO TABS
ORAL_TABLET | ORAL | Status: AC
  Filled 2015-06-14: qty 1

## 2015-06-14 MED ORDER — ONDANSETRON HCL 8 MG PO TABS
8.0000 mg | ORAL_TABLET | Freq: Once | ORAL | Status: AC
  Administered 2015-06-14: 8 mg via ORAL

## 2015-06-14 NOTE — Progress Notes (Addendum)
Eric Dudley PROGRESS NOTE   Diagnosis: Multiple myeloma  Multiple myeloma   01/31/2015 Tumor Marker SPEP showed M-SPIKE 1.7g/dl, Cr 1.5, no anemia, hypercalcemia   03/19/2015 Imaging Bone survey showed no discrete lytic lesion, but there is osteopenia and calvarial heterogeneity.   03/27/2015 Initial Diagnosis Multiple myeloma, stage I   03/27/2015 Bone Marrow Biopsy Bone Marrow, Aspirate,Biopsy: - HYPERCELLULAR BONE MARROW FOR AGE WITH PLASMA CELL NEOPLASM 47% - SEE COMMENT. PERIPHERAL BLOOD: - NO SIGNIFICANT MORPHOLOGIC ABNORMALITIES. Diagnosis Note The bone marrow shows increased number of    03/27/2015 Miscellaneous bone marrow Cytogenetics: normal. FISH panel showed the presence of +4 and +11, this is consider standard risk of MM    04/16/2015 - 04/23/2015 Hospital Admission He was admitted for worsening back pain, was found to have a T12 pathological fracture. He underwent kyphoplasty on 04/19/2015.   04/19/2015 -  Chemotherapy RVD with weekly Velcade 1.3 mg/m, dexamethasone 40 mg, and Revlimid 10 mg daily on day 1-21, every 28 days   04/26/2015 - 05/01/2015 Hospital Admission He was admitted to [facility] for syncope episode during his radiation simulation. EKG showed ST elevation, troponin was positive, he underwent cardio catheterization which showed mild stenosis. No intervention was needed. echo showed EF 35-40%   04/29/2015 - 05/10/2015 Radiation Therapy palliative RT to T12, 20Gy in 10 fractions        INTERVAL HISTORY:   Eric Dudley returns as scheduled. He continues RVD. He began the most recent cycle of Revlimid 06/13/2015. He denies nausea/vomiting. No mouth sores. No diarrhea or constipation. No numbness or tingling in his hands or feet. Pain is unchanged. No new areas of pain. He has periodic back spasms. He continues to have a good appetite.  Objective:  Vital signs in last 24 hours:  Blood pressure 125/71, pulse 80,  temperature 98 F (36.7 C), temperature source Oral, resp. rate 19, height 5' 8.5" (1.74 m), weight 182 lb 14.4 oz (82.963 kg), SpO2 97 %.    HEENT: No thrush or ulcers. Resp: Lungs clear bilaterally. Cardio: Regular rate and rhythm. GI: Abdomen soft and nontender. No hepatomegaly. Vascular: No leg edema. Neuro: Motor strength 5 over 5. Vibratory sense intact over the fingertips per tuning fork exam.  Skin: No rash.    Lab Results:  Lab Results  Component Value Date   WBC 8.1 06/14/2015   HGB 14.4 06/14/2015   HCT 42.7 06/14/2015   MCV 98.4* 06/14/2015   PLT 203 06/14/2015   NEUTROABS 6.5 06/14/2015    Imaging:  No results found.  Medications: I have reviewed the patient's current medications.  Assessment/Plan:  1.  IgG multiple myeloma, stage I, standard risk by cytogenetics (+4 and +11) -plasma cells in the bone marrow 47%.  -His SPEP showed M spike 1.7 g/dl, significantly increased IgG level and kappa light chain, kappa and lambda light chain ratio more than 100, he also had mild renal failure with creatinine 1.5-1.6, back pain and intermittent rib pain, bone survey showed osteopenia. Based on the new notable myeloma diagnostic criteria, he meets the criteria of multiple myeloma. He had normal albumin and miildly elevated beta 2 microglobulin, this is stage I. -His cytogenetics and Fish panel revealed standard risk. -His thoracic and lumbar spine MRI showed pathologic compression fracture at T12, focal myeloma deposits at L3 and T9, diffuse marrow signal abnormality consistent with myeloma -Induction chemotherapy with VRD regimen, weekly Velcade and dexamethasone, Revlimid dose 10 mg daily 3 week on, one-week off, based on  his renal function.   2.  T12 pathologic compression fracture in diffuse myelomatous involvement in bones -Status post kyphoplasty and palliative radiation   -pamidronate every 4 weeks, first treatment given 05/24/2015   3.  Back and rib pain  secondary to multiple myeloma bone lesions  4.  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 He is on aspirin -follow up with Dr. Percival Spanish    Disposition: [name] appears stable. He continues weekly Velcade/dexamethasone. He began cycle 3 Revlimid 06/13/2015. His renal function has improved. Dr. Burr Medico recommends increasing the Revlimid dose from 10 mg to 25 mg. He will complete the remaining 10 mg tablets (8) and then begin 25 mg for the remainder of this cycle (13). He continues pamidronate every 4 weeks with the next infusion due 06/21/2015.  We will continue to see him every 2 weeks.   Patient seen with Dr. Burr Medico.   Eric Dudley ANP/GNP-BC   06/14/2015  10:47 AM  I have seen the patient, examined him. I agree with the assessment and and plan and have edited the notes.  He is doing well overall, renal function significantly improved. Will increase his Revlimid to full dose. He was also seen at [facility], plan to have auto-SCT after induction chemo.  Eric Dudley

## 2015-06-14 NOTE — Patient Instructions (Signed)
[facility] Discharge Instructions for Patients Receiving Chemotherapy  Today you received the following chemotherapy agents:  Velcade  To help prevent nausea and vomiting after your treatment, we encourage you to take your nausea medication as prescribed.   If you develop nausea and vomiting that is not controlled by your nausea medication, call the clinic.   BELOW ARE SYMPTOMS THAT SHOULD BE REPORTED IMMEDIATELY:  *FEVER GREATER THAN 100.5 F  *CHILLS WITH OR WITHOUT FEVER  NAUSEA AND VOMITING THAT IS NOT CONTROLLED WITH YOUR NAUSEA MEDICATION  *UNUSUAL SHORTNESS OF BREATH  *UNUSUAL BRUISING OR BLEEDING  TENDERNESS IN MOUTH AND THROAT WITH OR WITHOUT PRESENCE OF ULCERS  *URINARY PROBLEMS  *BOWEL PROBLEMS  UNUSUAL RASH Items with * indicate a potential emergency and should be followed up as soon as possible.  Feel free to call the clinic you have any questions or concerns. The clinic phone number is [phone].  Please show the CHEMO ALERT CARD at check-in to the Emergency Department and triage nurse.   

## 2015-06-14 NOTE — Telephone Encounter (Signed)
Per staff message and POF I have scheduled appts. Advised scheduler of appts. JMW  

## 2015-06-17 ENCOUNTER — Telehealth: Payer: Self-pay | Admitting: *Deleted

## 2015-06-17 NOTE — Telephone Encounter (Signed)
PT. STATES HE SAW DR.FENG ON 06/14/15. SHE SAID THERE WAS NO NEED FOR THE PET SCAN AT THIS TIME. VERBAL ORDER AND READ BACK TO DR.FENG- IT IS AN OLD PET SCAN ORDER. PT. DOES NOT NEED A PET SCAN TOMORROW. NOTIFIED RADIOLOGY SPECIALS TO CANCEL PET SCAN. NOTIFIED PT. OF THE ABOVE INFORMATION.

## 2015-06-18 ENCOUNTER — Encounter (HOSPITAL_COMMUNITY): Payer: Medicare Other

## 2015-06-19 ENCOUNTER — Encounter: Payer: Self-pay | Admitting: Nurse Practitioner

## 2015-06-19 ENCOUNTER — Encounter: Payer: Self-pay | Admitting: Skilled Nursing Facility1

## 2015-06-19 DIAGNOSIS — C9000 Multiple myeloma not having achieved remission: Secondary | ICD-10-CM | POA: Diagnosis not present

## 2015-06-19 DIAGNOSIS — R809 Proteinuria, unspecified: Secondary | ICD-10-CM | POA: Diagnosis not present

## 2015-06-19 DIAGNOSIS — I10 Essential (primary) hypertension: Secondary | ICD-10-CM | POA: Diagnosis not present

## 2015-06-19 DIAGNOSIS — N183 Chronic kidney disease, stage 3 (moderate): Secondary | ICD-10-CM | POA: Diagnosis not present

## 2015-06-19 NOTE — Progress Notes (Signed)
Subjective:     Patient ID: Eric Dudley, male   DOB: 02/05/1951, 64 y.o.   MRN: [id number]  HPI   Review of Systems     Objective:   Physical Exam To assist the pt in identifying dietary strategies in gaining some lost wt back.    Assessment:     Pt identified as being malnourished due to lost wt. Pt contacted via [phone]. Pt states some days he has an appetite and some days he does not; he has lost 25 pounds. Pt states his mouth is always very dry so he keeps water by him all the time which helps. Pt states he still loves pork rinds and eats them with his water.     Plan:     Dietitian advised six small meals, adding cream to his soup, adding cheese to his vegetables, and setting a meal time alarm on days he does not have an appetite. Dietitian also advised he does not avoid specific foods and keeps the foods he loves well stocked.

## 2015-06-21 ENCOUNTER — Other Ambulatory Visit (HOSPITAL_BASED_OUTPATIENT_CLINIC_OR_DEPARTMENT_OTHER): Payer: Medicare Other

## 2015-06-21 ENCOUNTER — Ambulatory Visit (HOSPITAL_BASED_OUTPATIENT_CLINIC_OR_DEPARTMENT_OTHER): Payer: Medicare Other

## 2015-06-21 ENCOUNTER — Other Ambulatory Visit: Payer: Self-pay | Admitting: Hematology

## 2015-06-21 VITALS — BP 130/66 | HR 71 | Temp 97.4°F | Resp 20

## 2015-06-21 DIAGNOSIS — C9000 Multiple myeloma not having achieved remission: Secondary | ICD-10-CM | POA: Diagnosis not present

## 2015-06-21 LAB — CBC & DIFF AND RETIC
BASO%: 0.2 % (ref 0.0–2.0)
BASOS ABS: 0 10*3/uL (ref 0.0–0.1)
EOS ABS: 0.2 10*3/uL (ref 0.0–0.5)
EOS%: 2 % (ref 0.0–7.0)
HCT: 44 % (ref 38.4–49.9)
HEMOGLOBIN: 15.1 g/dL (ref 13.0–17.1)
Immature Retic Fract: 14.5 % — ABNORMAL HIGH (ref 3.00–10.60)
LYMPH%: 5.1 % — ABNORMAL LOW (ref 14.0–49.0)
MCH: 33.5 pg — ABNORMAL HIGH (ref 27.2–33.4)
MCHC: 34.3 g/dL (ref 32.0–36.0)
MCV: 97.6 fL (ref 79.3–98.0)
MONO#: 0.5 10*3/uL (ref 0.1–0.9)
MONO%: 5.5 % (ref 0.0–14.0)
NEUT%: 87.2 % — ABNORMAL HIGH (ref 39.0–75.0)
NEUTROS ABS: 8 10*3/uL — AB (ref 1.5–6.5)
PLATELETS: 207 10*3/uL (ref 140–400)
RBC: 4.51 10*6/uL (ref 4.20–5.82)
RDW: 15.1 % — ABNORMAL HIGH (ref 11.0–14.6)
RETIC %: 2.65 % — AB (ref 0.80–1.80)
Retic Ct Abs: 119.52 10*3/uL — ABNORMAL HIGH (ref 34.80–93.90)
WBC: 9.2 10*3/uL (ref 4.0–10.3)
lymph#: 0.5 10*3/uL — ABNORMAL LOW (ref 0.9–3.3)

## 2015-06-21 LAB — COMPREHENSIVE METABOLIC PANEL (CC13)
ALT: 351 U/L (ref 0–55)
AST: 151 U/L — AB (ref 5–34)
Albumin: 3.4 g/dL — ABNORMAL LOW (ref 3.5–5.0)
Alkaline Phosphatase: 522 U/L — ABNORMAL HIGH (ref 40–150)
Anion Gap: 8 mEq/L (ref 3–11)
BILIRUBIN TOTAL: 1.46 mg/dL — AB (ref 0.20–1.20)
BUN: 9.4 mg/dL (ref 7.0–26.0)
CALCIUM: 8.6 mg/dL (ref 8.4–10.4)
CHLORIDE: 104 meq/L (ref 98–109)
CO2: 21 mEq/L — ABNORMAL LOW (ref 22–29)
CREATININE: 1.1 mg/dL (ref 0.7–1.3)
EGFR: 70 mL/min/{1.73_m2} — ABNORMAL LOW (ref >90)
Glucose: 83 mg/dl (ref 70–140)
Potassium: 4.3 mEq/L (ref 3.5–5.1)
Sodium: 133 mEq/L — ABNORMAL LOW (ref 136–145)
Total Protein: 6.6 g/dL (ref 6.4–8.3)

## 2015-06-21 LAB — TECHNOLOGIST REVIEW

## 2015-06-21 MED ORDER — SODIUM CHLORIDE 0.9 % IV SOLN
Freq: Once | INTRAVENOUS | Status: AC
  Administered 2015-06-21: 11:00:00 via INTRAVENOUS

## 2015-06-21 MED ORDER — SODIUM CHLORIDE 0.9 % IV SOLN
90.0000 mg | Freq: Once | INTRAVENOUS | Status: AC
  Administered 2015-06-21: 90 mg via INTRAVENOUS
  Filled 2015-06-21: qty 10

## 2015-06-21 NOTE — Patient Instructions (Signed)
Pamidronate injection What is this medicine? PAMIDRONATE (pa mi DROE nate) slows calcium loss from bones. It is used to treat high calcium blood levels from cancer or Paget's disease. It is also used to treat bone pain and prevent fractures from certain cancers that have spread to the bone. This medicine may be used for other purposes; ask your health care provider or pharmacist if you have questions. COMMON BRAND NAME(S): Aredia What should I tell my health care provider before I take this medicine? They need to know if you have any of these conditions: -aspirin-sensitive asthma -dental disease -kidney disease -an unusual or allergic reaction to pamidronate, other medicines, foods, dyes, or preservatives -pregnant or trying to get pregnant -breast-feeding How should I use this medicine? This medicine is for infusion into a vein. It is given by a health care professional in a hospital or clinic setting. Talk to your pediatrician regarding the use of this medicine in children. This medicine is not approved for use in children. Overdosage: If you think you have taken too much of this medicine contact a poison control center or emergency room at once. NOTE: This medicine is only for you. Do not share this medicine with others. What if I miss a dose? This does not apply. What may interact with this medicine? -certain antibiotics given by injection -medicines for inflammation or pain like ibuprofen, naproxen -some diuretics like bumetanide, furosemide -cyclosporine -parathyroid hormone -tacrolimus -teriparatide -thalidomide This list may not describe all possible interactions. Give your health care provider a list of all the medicines, herbs, non-prescription drugs, or dietary supplements you use. Also tell them if you smoke, drink alcohol, or use illegal drugs. Some items may interact with your medicine. What should I watch for while using this medicine? Visit your doctor or health care  professional for regular checkups. It may be some time before you see the benefit from this medicine. Do not stop taking your medicine unless your doctor tells you to. Your doctor may order blood tests or other tests to see how you are doing. Women should inform their doctor if they wish to become pregnant or think they might be pregnant. There is a potential for serious side effects to an unborn child. Talk to your health care professional or pharmacist for more information. You should make sure that you get enough calcium and vitamin D while you are taking this medicine. Discuss the foods you eat and the vitamins you take with your health care professional. Some people who take this medicine have severe bone, joint, and/or muscle pain. This medicine may also increase your risk for a broken thigh bone. Tell your doctor right away if you have pain in your upper leg or groin. Tell your doctor if you have any pain that does not go away or that gets worse. What side effects may I notice from receiving this medicine? Side effects that you should report to your doctor or health care professional as soon as possible: -allergic reactions like skin rash, itching or hives, swelling of the face, lips, or tongue -black or tarry stools -changes in vision -eye inflammation, pain -high blood pressure -jaw pain, especially burning or cramping -muscle weakness -numb, tingling pain -swelling of feet or hands -trouble passing urine or change in the amount of urine -unable to move easily Side effects that usually do not require medical attention (report to your doctor or health care professional if they continue or are bothersome): -bone, joint, or muscle pain -constipation -dizzy, drowsy -  fever -headache -loss of appetite -nausea, vomiting -pain at site where injected This list may not describe all possible side effects. Call your doctor for medical advice about side effects. You may report side effects to  FDA at 1-800-FDA-1088. Where should I keep my medicine? This drug is given in a hospital or clinic and will not be stored at home. NOTE: This sheet is a summary. It may not cover all possible information. If you have questions about this medicine, talk to your doctor, pharmacist, or health care provider.  2015, [employer] Standard. (2011-06-12 08:49:49)  

## 2015-06-24 DIAGNOSIS — C9000 Multiple myeloma not having achieved remission: Secondary | ICD-10-CM | POA: Diagnosis not present

## 2015-06-25 LAB — SPEP & IFE WITH QIG
ALBUMIN ELP: 3.8 g/dL (ref 3.8–4.8)
ALPHA-1-GLOBULIN: 0.5 g/dL — AB (ref 0.2–0.3)
ALPHA-2-GLOBULIN: 1.3 g/dL — AB (ref 0.5–0.9)
Abnormal Protein Band1: 0.4 g/dL
BETA 2: 0.3 g/dL (ref 0.2–0.5)
BETA GLOBULIN: 0.5 g/dL (ref 0.4–0.6)
GAMMA GLOBULIN: 0.6 g/dL — AB (ref 0.8–1.7)
IgA: 12 mg/dL — ABNORMAL LOW (ref 68–379)
IgG (Immunoglobin G), Serum: 673 mg/dL (ref 650–1600)
IgM, Serum: 7 mg/dL — ABNORMAL LOW (ref 41–251)
TOTAL PROTEIN, SERUM ELECTROPHOR: 6.9 g/dL (ref 6.1–8.1)

## 2015-06-25 LAB — KAPPA/LAMBDA LIGHT CHAINS
KAPPA FREE LGHT CHN: 86 mg/dL — AB (ref 0.33–1.94)
Kappa:Lambda Ratio: 116.22 — ABNORMAL HIGH (ref 0.26–1.65)
Lambda Free Lght Chn: 0.74 mg/dL (ref 0.57–2.63)

## 2015-06-28 ENCOUNTER — Other Ambulatory Visit (HOSPITAL_BASED_OUTPATIENT_CLINIC_OR_DEPARTMENT_OTHER): Payer: Medicare Other

## 2015-06-28 ENCOUNTER — Telehealth: Payer: Self-pay | Admitting: Hematology

## 2015-06-28 ENCOUNTER — Ambulatory Visit (HOSPITAL_BASED_OUTPATIENT_CLINIC_OR_DEPARTMENT_OTHER): Payer: Medicare Other

## 2015-06-28 ENCOUNTER — Ambulatory Visit (HOSPITAL_BASED_OUTPATIENT_CLINIC_OR_DEPARTMENT_OTHER): Payer: Medicare Other | Admitting: Hematology

## 2015-06-28 VITALS — BP 109/61 | HR 70 | Temp 98.2°F | Resp 18 | Ht 68.5 in | Wt 179.8 lb

## 2015-06-28 DIAGNOSIS — Z5112 Encounter for antineoplastic immunotherapy: Secondary | ICD-10-CM | POA: Diagnosis present

## 2015-06-28 DIAGNOSIS — C9000 Multiple myeloma not having achieved remission: Secondary | ICD-10-CM

## 2015-06-28 DIAGNOSIS — G893 Neoplasm related pain (acute) (chronic): Secondary | ICD-10-CM | POA: Diagnosis not present

## 2015-06-28 LAB — COMPREHENSIVE METABOLIC PANEL (CC13)
ALBUMIN: 3.4 g/dL — AB (ref 3.5–5.0)
ALK PHOS: 299 U/L — AB (ref 40–150)
ALT: 128 U/L — AB (ref 0–55)
AST: 43 U/L — ABNORMAL HIGH (ref 5–34)
Anion Gap: 8 mEq/L (ref 3–11)
BUN: 9.9 mg/dL (ref 7.0–26.0)
CO2: 19 meq/L — AB (ref 22–29)
Calcium: 8.9 mg/dL (ref 8.4–10.4)
Chloride: 107 mEq/L (ref 98–109)
Creatinine: 1.1 mg/dL (ref 0.7–1.3)
EGFR: 71 mL/min/{1.73_m2} — ABNORMAL LOW (ref >90)
Glucose: 108 mg/dl (ref 70–140)
Potassium: 3.7 mEq/L (ref 3.5–5.1)
Sodium: 134 mEq/L — ABNORMAL LOW (ref 136–145)
TOTAL PROTEIN: 6.3 g/dL — AB (ref 6.4–8.3)
Total Bilirubin: 0.83 mg/dL (ref 0.20–1.20)

## 2015-06-28 LAB — CBC & DIFF AND RETIC
BASO%: 0.2 % (ref 0.0–2.0)
Basophils Absolute: 0 10*3/uL (ref 0.0–0.1)
EOS%: 2.7 % (ref 0.0–7.0)
Eosinophils Absolute: 0.3 10*3/uL (ref 0.0–0.5)
HCT: 42.5 % (ref 38.4–49.9)
HGB: 14.3 g/dL (ref 13.0–17.1)
IMMATURE RETIC FRACT: 10 % (ref 3.00–10.60)
LYMPH#: 0.5 10*3/uL — AB (ref 0.9–3.3)
LYMPH%: 3.6 % — ABNORMAL LOW (ref 14.0–49.0)
MCH: 33.5 pg — ABNORMAL HIGH (ref 27.2–33.4)
MCHC: 33.6 g/dL (ref 32.0–36.0)
MCV: 99.5 fL — ABNORMAL HIGH (ref 79.3–98.0)
MONO#: 1.3 10*3/uL — AB (ref 0.1–0.9)
MONO%: 10.4 % (ref 0.0–14.0)
NEUT#: 10.6 10*3/uL — ABNORMAL HIGH (ref 1.5–6.5)
NEUT%: 83.1 % — ABNORMAL HIGH (ref 39.0–75.0)
Platelets: 232 10*3/uL (ref 140–400)
RBC: 4.27 10*6/uL (ref 4.20–5.82)
RDW: 15.6 % — ABNORMAL HIGH (ref 11.0–14.6)
Retic %: 2.06 % — ABNORMAL HIGH (ref 0.80–1.80)
Retic Ct Abs: 87.96 10*3/uL (ref 34.80–93.90)
WBC: 12.7 10*3/uL — AB (ref 4.0–10.3)

## 2015-06-28 MED ORDER — CYCLOBENZAPRINE HCL 5 MG PO TABS: 5.0000 mg | ORAL_TABLET | Freq: Three times a day (TID) | ORAL | Status: DC

## 2015-06-28 MED ORDER — BORTEZOMIB CHEMO SQ INJECTION 3.5 MG (2.5MG/ML)
1.0000 mg/m2 | Freq: Once | INTRAMUSCULAR | Status: AC
  Administered 2015-06-28: 2 mg via SUBCUTANEOUS
  Filled 2015-06-28: qty 2

## 2015-06-28 MED ORDER — ONDANSETRON HCL 8 MG PO TABS
ORAL_TABLET | ORAL | Status: AC
  Filled 2015-06-28: qty 1

## 2015-06-28 MED ORDER — ONDANSETRON HCL 8 MG PO TABS
8.0000 mg | ORAL_TABLET | Freq: Once | ORAL | Status: AC
  Administered 2015-06-28: 8 mg via ORAL

## 2015-06-28 MED ORDER — BORTEZOMIB CHEMO SQ INJECTION 3.5 MG (2.5MG/ML): 1.0000 mg/m2 | Freq: Once | INTRAMUSCULAR | Status: DC

## 2015-06-28 NOTE — Progress Notes (Addendum)
[name]  [phone] Fax:([phone]) [place] Note   Patient Care Team: [name], MD as PCP - General (Internal Medicine) 06/28/2015  CHIEF COMPLAINTS: Follow-up multiple myeloma    Multiple myeloma   01/31/2015 Tumor Marker SPEP showed M-SPIKE 1.7g/dl, Cr 1.5, no anemia, hypercalcemia   03/19/2015 Imaging Bone survey showed no discrete lytic lesion, but there is osteopenia and calvarial heterogeneity.   03/27/2015 Initial Diagnosis Multiple myeloma, stage I   03/27/2015 Bone Marrow Biopsy Bone Marrow, Aspirate,Biopsy: - HYPERCELLULAR BONE MARROW FOR AGE WITH PLASMA CELL NEOPLASM 47% - SEE COMMENT. PERIPHERAL BLOOD: - NO SIGNIFICANT MORPHOLOGIC ABNORMALITIES. Diagnosis Note The bone marrow shows increased number of    03/27/2015 Miscellaneous bone marrow Cytogenetics: normal. FISH panel showed the presence of +4 and +11, this is consider standard risk of MM    04/16/2015 - 04/23/2015 Hospital Admission He was admitted for worsening back pain, was found to have a T12 pathological fracture. He underwent kyphoplasty on 04/19/2015.   04/19/2015 -  Chemotherapy RVD with weekly Velcade 1.3 mg/m, dexamethasone 40 mg, and Revlimid 10 mg daily on day 1-21, every 28 days, cycle 1 Revlimid was interupted by hospitalization, cycle 3 Revlimid dose increased to 25 mg daily   04/26/2015 - 05/01/2015 Hospital Admission He was admitted to [facility] for syncope episode during his radiation simulation. EKG showed ST elevation, troponin was positive, he underwent cardio catheterization which showed mild stenosis. No intervention was needed. echo showed EF 35-40%   04/29/2015 - 05/10/2015 Radiation Therapy palliative RT to T12, 20Gy in 10 fractions     HISTORY OF PRESENTING ILLNESS:  Eric Dudley 64 y.o. 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  He has been having low back pain for 3-4 months. He had food posinin 4 month ago, and had frequent diarrhea. He started noticed low back pain since then. The pain is not radiating to leg, he also has intermittent right rib pain, worse with cough and sneezing. He remains to be physically active, able to do all the activities, such as gardening work housework without much limitation, but he doesn't sings slowly because of back pain. He takes Tylenol as needed, does not like the necrotic pain medication. No night sweats, no fever or chillss, no weight loss.   He was seen by his primary care physician Dr. [name], MD and had lab test (see below). He also had bone density scan 2/25 which showed osteoporosis, has not been treated yet.  Current therapy: Weekly Velcade 1.3 mg/m and dexamethasone 40 mg, Revlimid 10 mg daily on day 1-21, every 28 days, started on 04/19/2015, Revlimid was held in the middle of the first cycle due to multiple hospitalization and radiation.   INTERIM HISTORY: Eric Dudley returns for follow-up. He is currently on cycle 3 recommended, day 14. I held his Velcade last week due to use worsening liver function. Clinically he is doing well overall. He has stable back pain, works around with a cane, able to tolerate daily routine activities, such as shopping. He denies any other new pain or symptoms. No nausea, his appetite is good. No neuropathy.   MEDICAL HISTORY:  Past Medical History  Diagnosis Date  . Hypertension   . Dyslipidemia   . Tonsillar cancer 2006  . Cholelithiases   . Hyperlipidemia   . Chronic kidney disease  [place]    SURGICAL HISTORY: Past Surgical History  Procedure Laterality Date  . Appendectomy  1962  . Shoulder surgery  1999    left  . Knee surgery  2012    right  . Cardiac catheterization N/A 04/30/2015    Procedure: Left Heart Cath and Coronary Angiography;  Surgeon: Peter M Martinique, MD;  Location: [facility] INVASIVE CV LAB CUPID;   Service: Cardiovascular;  Laterality: N/A;  . Cardiolite myocardial perfusion study  04/16/03    NEGITIVE BRUCE PROTOCAL EXERCISE STRESS TEST. EF 70%. NO ISCHEMIA.    SOCIAL HISTORY: History   Social History  . Marital Status: Married    Spouse Name: N/A  . Number of Children: 1   . Years of Education: N/A   Occupational History  . A retired Therapist, occupational    Social History Main Topics  . Smoking status: Former Smoker    Types: Cigars    Quit date: 12/29/2003  . Smokeless tobacco: Former Systems developer    Types: Chew    Quit date: 12/28/1994  . Alcohol Use: Yes     Comment: occasional beer  . Drug Use: No  . Sexual Activity: Not on file   Other Topics Concern  . Not on file   Social History Narrative  . No narrative on file    FAMILY HISTORY: Family History  Problem Relation Age of Onset  . Cancer Mother 58    cervical cancer   . Cancer Father     unknown cancer   . Hypertension Sister   . Cancer Sister     melanoma     ALLERGIES:  is allergic to hydrochlorothiazide; asa; hydrocodone; amoxicillin; and indapamide.  MEDICATIONS:  Current Outpatient Prescriptions  Medication Sig Dispense Refill  . acetaminophen (TYLENOL) 325 MG tablet Take 2 tablets (650 mg total) by mouth every 6 (six) hours as needed for mild pain, fever or headache.    [name] acyclovir (ZOVIRAX) 400 MG tablet Take 1 tablet (400 mg total) by mouth 2 (two) times daily. 180 tablet 1  . aspirin 81 MG chewable tablet Chew 1 tablet (81 mg total) by mouth daily.    . Bisacodyl (DULCOLAX PO) Take 3 capsules by mouth daily as needed (for constipation).     . BORTEZOMIB IJ Inject as directed once a week.    . calcium carbonate (OS-CAL) 600 MG TABS tablet Take 1,200 mg by mouth daily with breakfast.    . Cholecalciferol (VITAMIN D-3) 1000 UNITS CAPS Take 1,000 Units by mouth daily.    . clopidogrel (PLAVIX) 75 MG tablet Take 1 tablet (75 mg total) by mouth daily. 90 tablet 3  . cyclobenzaprine (FLEXERIL) 5 MG  tablet Take 1 tablet (5 mg total) by mouth 3 (three) times daily. 90 tablet 1  . dexamethasone (DECADRON) 4 MG tablet Take 10 tablets (40 mg total) by mouth once a week. Take 40 mg ( 10 tablets )  Once  A  Week  On  Day  Of  Velcade. 120 tablet 1  . ezetimibe-simvastatin (VYTORIN) 10-40 MG per tablet Take 1 tablet by mouth at bedtime.    . hydrOXYzine (VISTARIL) 25 MG capsule Take 25 mg by mouth as needed.    [name] lenalidomide (REVLIMID) 25 MG capsule Take 1 capsule (25 mg total) by mouth daily. Take 17m by mouth daily for 14 days;  Rest  7 days. 14 capsule 0  . metoprolol succinate (TOPROL-XL) 25 MG 24 hr tablet Take 0.5 tablets (12.5 mg total) by  mouth daily. 45 tablet 3  . Multiple Vitamins-Minerals (CENTRUM SILVER ULTRA MENS PO) Take 1 tablet by mouth daily.    . nitroGLYCERIN (NITROSTAT) 0.4 MG SL tablet Place 0.4 mg under the tongue every 5 (five) minutes as needed for chest pain.    [name] ondansetron (ZOFRAN) 8 MG tablet Take 1 tablet (8 mg total) by mouth 2 (two) times daily as needed for nausea or vomiting. 60 tablet 1  . pamidronate (AREDIA) 30 MG injection Inject into the vein every 28 (twenty-eight) days.    . polyethylene glycol (MIRALAX / GLYCOLAX) packet Take 17 g by mouth 2 (two) times daily.    . potassium chloride SA (K-DUR,KLOR-CON) 20 MEQ tablet Take 3 tablets (60 mEq total) by mouth daily. 270 tablet 1  . ranitidine (ZANTAC) 150 MG tablet Take 2 tablets (300 mg total) by mouth at bedtime. 60 tablet 1  . sennosides-docusate sodium (SENOKOT-S) 8.6-50 MG tablet Take 2 tablets by mouth 2 (two) times daily.    [name] spironolactone (ALDACTONE) 25 MG tablet Take 0.5 tablets (12.5 mg total) by mouth daily. 45 tablet 3   No current facility-administered medications for this visit.    REVIEW OF SYSTEMS:   Constitutional: Denies fevers, chills or abnormal night sweats Eyes: Denies blurriness of vision, double vision or watery eyes Ears, nose, mouth, throat, and face: Denies mucositis or sore  throat Respiratory: Denies cough, dyspnea or wheezes Cardiovascular: Denies palpitation, chest discomfort or lower extremity swelling Gastrointestinal:  Denies nausea, heartburn or change in bowel habits Skin: Denies abnormal skin rashes Lymphatics: Denies new lymphadenopathy or easy bruising Neurological:Denies numbness, tingling or new weaknesses Behavioral/Psych: Mood is stable, no new changes  Musculoskeletal: Positive for back pain and intermittent rib pain All other systems were reviewed with the patient and are negative.  PHYSICAL EXAMINATION: ECOG PERFORMANCE STATUS: 1 - Symptomatic but completely ambulatory BP 109/61 mmHg  Pulse 70  Temp(Src) 98.2 F (36.8 C) (Oral)  Resp 18  Ht 5' 8.5" (1.74 m)  Wt 179 lb 12.8 oz (81.557 kg)  BMI 26.94 kg/m2  SpO2 98%  GENERAL: Today on the table, drowsy, arousable, no distress and comfortable SKIN: skin color, texture, turgor are normal, no rashes or significant lesions EYES: normal, conjunctiva are pink and non-injected, sclera clear OROPHARYNX:no exudate, no erythema and lips, buccal mucosa, and tongue normal  NECK: supple, thyroid normal size, non-tender, without nodularity LYMPH:  no palpable lymphadenopathy in the cervical, axillary or inguinal LUNGS: clear to auscultation and percussion with normal breathing effort HEART: regular rate & rhythm and no murmurs and no lower extremity edema ABDOMEN:abdomen soft, non-tender and normal bowel sounds Musculoskeletal:no cyanosis of digits and no clubbing, mild tenderness on bilateral lateral chest wall  PSYCH: alert & oriented x 3 with fluent speech NEURO: no focal motor/sensory deficits  LABORATORY DATA:   CBC Latest Ref Rng 06/28/2015 06/21/2015 06/14/2015  WBC 4.0 - 10.3 10e3/uL 12.7(H) 9.2 8.1  Hemoglobin 13.0 - 17.1 g/dL 14.3 15.1 14.4  Hematocrit 38.4 - 49.9 % 42.5 44.0 42.7  Platelets 140 - 400 10e3/uL 232 207 203   CMP Latest Ref Rng 06/28/2015 06/21/2015 06/14/2015  Glucose 70 -  140 mg/dl 108 83 99  BUN 7.0 - 26.0 mg/dL 9.9 9.4 6.4(L)  Creatinine 0.7 - 1.3 mg/dL 1.1 1.1 1.2  Sodium 136 - 145 mEq/L 134(L) 133(L) 136  Potassium 3.5 - 5.1 mEq/L 3.7 4.3 4.4  Chloride 101 - 111 mmol/L - - -  CO2 22 - 29 mEq/L 19(L) 21(L)  19(L)  Calcium 8.4 - 10.4 mg/dL 8.9 8.6 8.5  Total Protein 6.4 - 8.3 g/dL 6.3(L) 6.6 6.3(L)  Total Bilirubin 0.20 - 1.20 mg/dL 0.83 1.46(H) 0.55  Alkaline Phos 40 - 150 U/L 299(H) 522(H) 281(H)  AST 5 - 34 U/L 43(H) 151(H) 22  ALT 0 - 55 U/L 128(H) 351(HH) 36     PATHOLOGY REPORT Bone Marrow, Aspirate,Biopsy, and Clot, right iliac 03/27/2015 - HYPERCELLULAR BONE MARROW FOR AGE WITH PLASMA CELL NEOPLASM. - SEE COMMENT. PERIPHERAL BLOOD: - NO SIGNIFICANT MORPHOLOGIC ABNORMALITIES. Diagnosis Note The bone marrow shows increased number of atypical plasma cells representing 47% of all cells in the aspirate associated with prominent interstitial infiltrates and variably sized aggregates in the clot and biopsy sections. Immunohistochemical stains show that the plasma cells are kappa light chain restricted consistent with plasma cell neoplasm. The background shows trilineage  Cytogenetics: normal FISH panel: +4 and +11  RADIOGRAPHIC STUDIES: Bone survey 03/19/2015 IMPRESSION: No discrete lytic lesion, but there is osteopenia and calvarial heterogeneity -nonspecific findings which can be manifestations of Myeloma  ASSESSMENT & PLAN:  64 year old Caucasian male with past medical history of hypertension, tonsil cancer status post surgery and radiation 10 years ago, now presented with 3-4 months low back pain and intermittent rib pain. His blood test showed M protein 1.7g/dl.  1. IgG multiple myeloma, stage I, standard risk by cytogenetics (+4 and +11) -I discussed his bone marrow biopsy results with him. He has significant amount of plasma cells in the bone marrow 47%.  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 Based on the new notable myeloma diagnostic criteria, he meets the criteria of multiple myeloma. He has normal albumin and miildly elevated beta 2 microglobulin, this is stage I. -His cytogenetics and Fish panel revealed standard risk. -His thoracic and lumbar spine MRI showed pathologic compression fracture at T12, focal myeloma deposits at L3 and T9, diffuse marrow signal abnormality consistent with myeloma -I recommended induction chemotherapy with VRD regimen, weekly Velcade and dexamethasone, first cycle Revlimid dose will be 10 mg daily 3 week on, one-week off, based on his renal function, which was increased to 4 dose from cycle 3. -The goal of treatment is disease control, potential cure with bone marrow transplant -He is a candidate for bone marrow transplant, has been seen by Dr. Norma Fredrickson at [facility], he will return for bone marrow transplant evaluation and bone marrow biopsy in August after his fourth cycle of induction chemotherapy. I spoke with Dr. Norma Fredrickson today. -His PET scan has been canceled due to his hospitalization.   2. T12 pathologic compression fracture, and diffuse myeloma involvement in bones -Status post kyphoplasty and palliative radiation  -He received palliative Radiation, pain much improved  -continue pamidronate every 4 weeks   3. Back and rib pain, secondary to multiple myeloma bone lesions  -improved  -He could not tolerate narcotics -Continue Flexeril as needed  4 nonobstructive CAD, STEMI, Non ischemic CM, Takatsubo, with EF of 35-40% -Patient was initially found to have troponin elevation and anterior apical ST elevation MI and resultant cardiomyopathy on 04/26/15 -He underwent a cardiac catheterization on 5/3 which showed  mild diffuse disease, no stents were placed. He is on aspirin -follow up with Dr. Percival Spanish -He needs to repeat echo before  transplant   5. Transaminitis -His ALT increased to 351, AST increased to 141 on 06/21/2015, possible related to treatment -Significant improvement after we held Velcade last week -Continue close monitoring  Follow up: -continue cycle 3 VRD, resume Velcade, dose reduce to 1.0 mg/m due to his liver function issue -Return to clinic in 2 weeks before cycle 4   All questions were answered. The patient knows to call the clinic with any problems, questions or concerns.  I spent 25 minutes counseling the patient face to face. The total time spent in the appointment was 30 minutes and more than 50% was on counseling.     Truitt Merle, MD 06/28/2015

## 2015-06-28 NOTE — Telephone Encounter (Addendum)
Pt confirmed labs/ov per 07/01 POF, gave pt AVS and Calendar.Cherylann Banas, sent msg to add chemo

## 2015-06-28 NOTE — Patient Instructions (Signed)
[facility] Discharge Instructions for Patients Receiving Chemotherapy  Today you received the following chemotherapy agents Velcade. To help prevent nausea and vomiting after your treatment, we encourage you to take your nausea medication as directed.  If you develop nausea and vomiting that is not controlled by your nausea medication, call the clinic.   BELOW ARE SYMPTOMS THAT SHOULD BE REPORTED IMMEDIATELY:  *FEVER GREATER THAN 100.5 F  *CHILLS WITH OR WITHOUT FEVER  NAUSEA AND VOMITING THAT IS NOT CONTROLLED WITH YOUR NAUSEA MEDICATION  *UNUSUAL SHORTNESS OF BREATH  *UNUSUAL BRUISING OR BLEEDING  TENDERNESS IN MOUTH AND THROAT WITH OR WITHOUT PRESENCE OF ULCERS  *URINARY PROBLEMS  *BOWEL PROBLEMS  UNUSUAL RASH Items with * indicate a potential emergency and should be followed up as soon as possible.  Feel free to call the clinic you have any questions or concerns. The clinic phone number is [phone].  Please show the CHEMO ALERT CARD at check-in to the Emergency Department and triage nurse.    

## 2015-06-28 NOTE — Progress Notes (Signed)
Okay to treat with reduced dose with labs 06/28/15; ALT 128; per Dr. Burr Medico.

## 2015-06-29 ENCOUNTER — Encounter: Payer: Self-pay | Admitting: Hematology

## 2015-07-02 ENCOUNTER — Telehealth: Payer: Self-pay | Admitting: *Deleted

## 2015-07-02 DIAGNOSIS — D170 Benign lipomatous neoplasm of skin and subcutaneous tissue of head, face and neck: Secondary | ICD-10-CM | POA: Diagnosis not present

## 2015-07-02 DIAGNOSIS — K117 Disturbances of salivary secretion: Secondary | ICD-10-CM | POA: Diagnosis not present

## 2015-07-02 DIAGNOSIS — J384 Edema of larynx: Secondary | ICD-10-CM | POA: Diagnosis not present

## 2015-07-02 DIAGNOSIS — Z87891 Personal history of nicotine dependence: Secondary | ICD-10-CM | POA: Diagnosis not present

## 2015-07-02 LAB — UIFE/LIGHT CHAINS/TP QN, 24-HR UR
ALPHA 2 UR: DETECTED — AB
Albumin, U: DETECTED
Alpha 1, Urine: DETECTED — AB
Beta, Urine: DETECTED — AB
Gamma Globulin, Urine: DETECTED — AB
TIME-UPE24: 24 h
TOTAL PROTEIN, URINE-UPE24: 83 mg/dL — AB (ref 5–25)

## 2015-07-02 LAB — 24 HR URINE,KAPPA/LAMBDA LIGHT CHAINS
MEASURED LAMBDA CHAIN: 1.14 mg/dL (ref <2.00)
Measured Kappa Chain: 9.22 mg/dL — ABNORMAL HIGH (ref <2.00)
TOTAL LAMBDA CHAIN: 39.9 mg/(24.h)
Total Kappa Chain: 322.7 mg/24 h
URINE VOLUME: 3500 mL/(24.h)

## 2015-07-02 LAB — UPEP/TP, 24-HR URINE
ALPHA-1-GLOBULIN, U: 30.9 %
Albumin: 22.8 %
Alpha-2-Globulin, U: 16.4 %
BETA GLOBULIN, U: 16.9 %
COLLECTION INTERVAL: 24 h
GAMMA GLOBULIN, U: 13 %
Monoclonal Band 1: 1 %
Monoclonal Band 2: 0.2 %
Total Protein, Urine/Day: 2905 mg/d — ABNORMAL HIGH (ref 50–100)
Total Protein, Urine: 83 mg/dL
Total Volume, Urine: 3500 mL

## 2015-07-02 NOTE — Telephone Encounter (Signed)
Per staff message and POF I have scheduled appts. Advised scheduler of appts. JMW  

## 2015-07-05 ENCOUNTER — Telehealth: Payer: Self-pay | Admitting: *Deleted

## 2015-07-05 ENCOUNTER — Ambulatory Visit (HOSPITAL_BASED_OUTPATIENT_CLINIC_OR_DEPARTMENT_OTHER): Payer: Medicare Other

## 2015-07-05 ENCOUNTER — Other Ambulatory Visit (HOSPITAL_BASED_OUTPATIENT_CLINIC_OR_DEPARTMENT_OTHER): Payer: Medicare Other

## 2015-07-05 ENCOUNTER — Other Ambulatory Visit: Payer: Self-pay | Admitting: Hematology and Oncology

## 2015-07-05 ENCOUNTER — Other Ambulatory Visit: Payer: Self-pay | Admitting: *Deleted

## 2015-07-05 VITALS — BP 128/65 | HR 66 | Temp 98.1°F | Resp 17

## 2015-07-05 DIAGNOSIS — Z5112 Encounter for antineoplastic immunotherapy: Secondary | ICD-10-CM | POA: Diagnosis present

## 2015-07-05 DIAGNOSIS — C9000 Multiple myeloma not having achieved remission: Secondary | ICD-10-CM

## 2015-07-05 LAB — COMPREHENSIVE METABOLIC PANEL (CC13)
ALT: 77 U/L — ABNORMAL HIGH (ref 0–55)
ANION GAP: 8 meq/L (ref 3–11)
AST: 33 U/L (ref 5–34)
Albumin: 3.4 g/dL — ABNORMAL LOW (ref 3.5–5.0)
Alkaline Phosphatase: 239 U/L — ABNORMAL HIGH (ref 40–150)
BUN: 9.9 mg/dL (ref 7.0–26.0)
CALCIUM: 9.1 mg/dL (ref 8.4–10.4)
CHLORIDE: 109 meq/L (ref 98–109)
CO2: 19 meq/L — AB (ref 22–29)
Creatinine: 1.4 mg/dL — ABNORMAL HIGH (ref 0.7–1.3)
EGFR: 52 mL/min/{1.73_m2} — AB (ref >90)
Glucose: 94 mg/dl (ref 70–140)
Potassium: 4.2 mEq/L (ref 3.5–5.1)
Sodium: 135 mEq/L — ABNORMAL LOW (ref 136–145)
Total Bilirubin: 0.75 mg/dL (ref 0.20–1.20)
Total Protein: 6.4 g/dL (ref 6.4–8.3)

## 2015-07-05 LAB — CBC & DIFF AND RETIC
BASO%: 0.1 % (ref 0.0–2.0)
BASOS ABS: 0 10*3/uL (ref 0.0–0.1)
EOS%: 4.7 % (ref 0.0–7.0)
Eosinophils Absolute: 0.3 10*3/uL (ref 0.0–0.5)
HCT: 44.4 % (ref 38.4–49.9)
HEMOGLOBIN: 14.8 g/dL (ref 13.0–17.1)
IMMATURE RETIC FRACT: 8.8 % (ref 3.00–10.60)
LYMPH#: 0.4 10*3/uL — AB (ref 0.9–3.3)
LYMPH%: 5.2 % — ABNORMAL LOW (ref 14.0–49.0)
MCH: 33.6 pg — ABNORMAL HIGH (ref 27.2–33.4)
MCHC: 33.3 g/dL (ref 32.0–36.0)
MCV: 100.7 fL — ABNORMAL HIGH (ref 79.3–98.0)
MONO#: 0.8 10*3/uL (ref 0.1–0.9)
MONO%: 11 % (ref 0.0–14.0)
NEUT%: 79 % — ABNORMAL HIGH (ref 39.0–75.0)
NEUTROS ABS: 5.8 10*3/uL (ref 1.5–6.5)
Platelets: 149 10*3/uL (ref 140–400)
RBC: 4.41 10*6/uL (ref 4.20–5.82)
RDW: 15.6 % — AB (ref 11.0–14.6)
RETIC CT ABS: 121.72 10*3/uL — AB (ref 34.80–93.90)
Retic %: 2.76 % — ABNORMAL HIGH (ref 0.80–1.80)
WBC: 7.3 10*3/uL (ref 4.0–10.3)

## 2015-07-05 LAB — LIPASE: LIPASE: 26 U/L (ref 0–75)

## 2015-07-05 MED ORDER — ONDANSETRON HCL 8 MG PO TABS
8.0000 mg | ORAL_TABLET | Freq: Once | ORAL | Status: AC
  Administered 2015-07-05: 8 mg via ORAL

## 2015-07-05 MED ORDER — ONDANSETRON HCL 8 MG PO TABS
ORAL_TABLET | ORAL | Status: AC
Start: 2015-07-05 — End: 2015-07-05
  Filled 2015-07-05: qty 1

## 2015-07-05 MED ORDER — BORTEZOMIB CHEMO SQ INJECTION 3.5 MG (2.5MG/ML)
1.0000 mg/m2 | Freq: Once | INTRAMUSCULAR | Status: AC
  Administered 2015-07-05: 2 mg via SUBCUTANEOUS
  Filled 2015-07-05: qty 2

## 2015-07-05 MED ORDER — LENALIDOMIDE 25 MG PO CAPS: 25.0000 mg | ORAL_CAPSULE | Freq: Every day | ORAL | Status: DC

## 2015-07-05 NOTE — Patient Instructions (Signed)
[facility] Discharge Instructions for Patients Receiving Chemotherapy  Today you received the following chemotherapy agents Velcade. To help prevent nausea and vomiting after your treatment, we encourage you to take your nausea medication as directed.  If you develop nausea and vomiting that is not controlled by your nausea medication, call the clinic.   BELOW ARE SYMPTOMS THAT SHOULD BE REPORTED IMMEDIATELY:  *FEVER GREATER THAN 100.5 F  *CHILLS WITH OR WITHOUT FEVER  NAUSEA AND VOMITING THAT IS NOT CONTROLLED WITH YOUR NAUSEA MEDICATION  *UNUSUAL SHORTNESS OF BREATH  *UNUSUAL BRUISING OR BLEEDING  TENDERNESS IN MOUTH AND THROAT WITH OR WITHOUT PRESENCE OF ULCERS  *URINARY PROBLEMS  *BOWEL PROBLEMS  UNUSUAL RASH Items with * indicate a potential emergency and should be followed up as soon as possible.  Feel free to call the clinic you have any questions or concerns. The clinic phone number is [phone].  Please show the CHEMO ALERT CARD at check-in to the Emergency Department and triage nurse.    

## 2015-07-05 NOTE — Telephone Encounter (Signed)
Received call from Rebecca/[facility] stating that she is scheduling pt for 07/18/15 for eval/testing.  She was trying to figure out where he is in his cycle.  She was able to pull up Dr Ernestina Penna last note in [facility] after she called.  Will inform Dr. Burr Medico of plan.

## 2015-07-08 ENCOUNTER — Other Ambulatory Visit: Payer: Self-pay | Admitting: *Deleted

## 2015-07-08 DIAGNOSIS — C9000 Multiple myeloma not having achieved remission: Secondary | ICD-10-CM

## 2015-07-08 MED ORDER — LENALIDOMIDE 25 MG PO CAPS: 25.0000 mg | ORAL_CAPSULE | Freq: Every day | ORAL | Status: DC

## 2015-07-12 ENCOUNTER — Other Ambulatory Visit (HOSPITAL_BASED_OUTPATIENT_CLINIC_OR_DEPARTMENT_OTHER): Payer: Medicare Other

## 2015-07-12 ENCOUNTER — Ambulatory Visit (HOSPITAL_BASED_OUTPATIENT_CLINIC_OR_DEPARTMENT_OTHER): Payer: Medicare Other | Admitting: Nurse Practitioner

## 2015-07-12 ENCOUNTER — Ambulatory Visit (HOSPITAL_BASED_OUTPATIENT_CLINIC_OR_DEPARTMENT_OTHER): Payer: Medicare Other

## 2015-07-12 VITALS — BP 122/60 | HR 55 | Temp 97.6°F | Wt 179.4 lb

## 2015-07-12 DIAGNOSIS — G893 Neoplasm related pain (acute) (chronic): Secondary | ICD-10-CM | POA: Diagnosis not present

## 2015-07-12 DIAGNOSIS — L271 Localized skin eruption due to drugs and medicaments taken internally: Secondary | ICD-10-CM | POA: Diagnosis not present

## 2015-07-12 DIAGNOSIS — Z5112 Encounter for antineoplastic immunotherapy: Secondary | ICD-10-CM | POA: Diagnosis present

## 2015-07-12 DIAGNOSIS — C9000 Multiple myeloma not having achieved remission: Secondary | ICD-10-CM

## 2015-07-12 LAB — CBC & DIFF AND RETIC
BASO%: 0.5 % (ref 0.0–2.0)
Basophils Absolute: 0 10*3/uL (ref 0.0–0.1)
EOS ABS: 0.3 10*3/uL (ref 0.0–0.5)
EOS%: 3.7 % (ref 0.0–7.0)
HEMATOCRIT: 43.3 % (ref 38.4–49.9)
HGB: 14.4 g/dL (ref 13.0–17.1)
Immature Retic Fract: 10.5 % (ref 3.00–10.60)
LYMPH%: 2.8 % — ABNORMAL LOW (ref 14.0–49.0)
MCH: 33.5 pg — AB (ref 27.2–33.4)
MCHC: 33.3 g/dL (ref 32.0–36.0)
MCV: 100.7 fL — AB (ref 79.3–98.0)
MONO#: 0.9 10*3/uL (ref 0.1–0.9)
MONO%: 10.6 % (ref 0.0–14.0)
NEUT%: 82.4 % — AB (ref 39.0–75.0)
NEUTROS ABS: 6.7 10*3/uL — AB (ref 1.5–6.5)
NRBC: 0 % (ref 0–0)
Platelets: 239 10*3/uL (ref 140–400)
RBC: 4.3 10*6/uL (ref 4.20–5.82)
RDW: 15.7 % — AB (ref 11.0–14.6)
Retic %: 2.29 % — ABNORMAL HIGH (ref 0.80–1.80)
Retic Ct Abs: 98.47 10*3/uL — ABNORMAL HIGH (ref 34.80–93.90)
WBC: 8.1 10*3/uL (ref 4.0–10.3)
lymph#: 0.2 10*3/uL — ABNORMAL LOW (ref 0.9–3.3)

## 2015-07-12 LAB — COMPREHENSIVE METABOLIC PANEL (CC13)
ALT: 35 U/L (ref 0–55)
AST: 21 U/L (ref 5–34)
Albumin: 3.3 g/dL — ABNORMAL LOW (ref 3.5–5.0)
Alkaline Phosphatase: 202 U/L — ABNORMAL HIGH (ref 40–150)
Anion Gap: 5 mEq/L (ref 3–11)
BILIRUBIN TOTAL: 0.51 mg/dL (ref 0.20–1.20)
BUN: 6.6 mg/dL — ABNORMAL LOW (ref 7.0–26.0)
CO2: 18 meq/L — AB (ref 22–29)
Calcium: 8.7 mg/dL (ref 8.4–10.4)
Chloride: 113 mEq/L — ABNORMAL HIGH (ref 98–109)
Creatinine: 1.2 mg/dL (ref 0.7–1.3)
EGFR: 66 mL/min/{1.73_m2} — AB (ref >90)
Glucose: 134 mg/dl (ref 70–140)
POTASSIUM: 4.1 meq/L (ref 3.5–5.1)
Sodium: 135 mEq/L — ABNORMAL LOW (ref 136–145)
Total Protein: 6.1 g/dL — ABNORMAL LOW (ref 6.4–8.3)

## 2015-07-12 MED ORDER — ONDANSETRON HCL 8 MG PO TABS
ORAL_TABLET | ORAL | Status: AC
  Filled 2015-07-12: qty 1

## 2015-07-12 MED ORDER — ONDANSETRON HCL 8 MG PO TABS
8.0000 mg | ORAL_TABLET | Freq: Once | ORAL | Status: AC
  Administered 2015-07-12: 8 mg via ORAL

## 2015-07-12 MED ORDER — BORTEZOMIB CHEMO SQ INJECTION 3.5 MG (2.5MG/ML)
1.0000 mg/m2 | Freq: Once | INTRAMUSCULAR | Status: AC
Start: 2015-07-12 — End: 2015-07-12
  Administered 2015-07-12: 2 mg via SUBCUTANEOUS
  Filled 2015-07-12: qty 2

## 2015-07-12 NOTE — Progress Notes (Signed)
[place] OFFICE PROGRESS NOTE   Diagnosis:  Multiple myeloma Multiple myeloma   01/31/2015 Tumor Marker SPEP showed M-SPIKE 1.7g/dl, Cr 1.5, no anemia, hypercalcemia   03/19/2015 Imaging Bone survey showed no discrete lytic lesion, but there is osteopenia and calvarial heterogeneity.   03/27/2015 Initial Diagnosis Multiple myeloma, stage I   03/27/2015 Bone Marrow Biopsy Bone Marrow, Aspirate,Biopsy: - HYPERCELLULAR BONE MARROW FOR AGE WITH PLASMA CELL NEOPLASM 47% - SEE COMMENT. PERIPHERAL BLOOD: - NO SIGNIFICANT MORPHOLOGIC ABNORMALITIES. Diagnosis Note The bone marrow shows increased number of    03/27/2015 Miscellaneous bone marrow Cytogenetics: normal. FISH panel showed the presence of +4 and +11, this is consider standard risk of MM    04/16/2015 - 04/23/2015 Hospital Admission He was admitted for worsening back pain, was found to have a T12 pathological fracture. He underwent kyphoplasty on 04/19/2015.   04/19/2015 -  Chemotherapy RVD with weekly Velcade 1.3 mg/m, dexamethasone 40 mg, and Revlimid 10 mg daily on day 1-21, every 28 days, cycle 1 Revlimid was interupted by hospitalization, cycle 3 Revlimid dose increased to 25 mg daily   04/26/2015 - 05/01/2015 Hospital Admission He was admitted to [facility] for syncope episode during his radiation simulation. EKG showed ST elevation, troponin was positive, he underwent cardio catheterization which showed mild stenosis. No intervention was needed. echo showed EF 35-40%   04/29/2015 - 05/10/2015 Radiation Therapy palliative RT to T12, 20Gy in 10 fractions       Current therapy: Weekly Velcade 1.3 mg/m and dexamethasone 40 mg, Revlimid 10 mg daily on day 1-21, every 28 days, started on 04/19/2015, Revlimid was held in the middle of the first cycle due to multiple hospitalization and radiation. Revlimid dose increased to 25 mg during cycle 3.  INTERVAL HISTORY:   Eric Dudley returns as scheduled. He  continues RVD. He began cycle 4 Revlimid 07/11/2015. He has rare nausea. No mouth sores. No diarrhea or constipation. Stable back pain. He notes a rash on the bilateral thighs. The rash was previously on the lower legs. He feels the rash is resolving.  Objective:  Vital signs in last 24 hours:  Blood pressure 122/60, pulse 55, temperature 97.6 F (36.4 C), temperature source Oral, weight 179 lb 6.4 oz (81.375 kg), SpO2 100 %.    HEENT: No thrush or ulcers. Resp: Lungs clear bilaterally. Cardio: Regular rate and rhythm. GI: Abdomen soft and nontender. No organomegaly. Vascular: No leg edema. Skin: Mildly erythematous dry appearing macular rash bilateral anterior thigh regions.   Lab Results:  Lab Results  Component Value Date   WBC 8.1 07/12/2015   HGB 14.4 07/12/2015   HCT 43.3 07/12/2015   MCV 100.7* 07/12/2015   PLT 239 07/12/2015   NEUTROABS 6.7* 07/12/2015    Imaging:  No results found.  Medications: I have reviewed the patient's current medications.  Assessment/Plan: 1. IgG multiple myeloma, stage I, standard risk by cytogenetics (+4 and +11) -plasma cells in the bone marrow 47%.  -His SPEP showed M spike 1.7 g/dl, significantly increased IgG level and kappa light chain, kappa and lambda light chain ratio more than 100, he also had mild renal failure with creatinine 1.5-1.6, back pain and intermittent rib pain, bone survey showed osteopenia. Based on the new notable myeloma diagnostic criteria, he meets the criteria of multiple myeloma. He had normal albumin and miildly elevated beta 2 microglobulin, this is stage I. -His cytogenetics and Fish panel revealed standard risk. -His thoracic and lumbar spine MRI showed pathologic compression  fracture at T12, focal myeloma deposits at L3 and T9, diffuse marrow signal abnormality consistent with myeloma -Induction chemotherapy with RVD regimen, weekly Velcade and dexamethasone, Revlimid dose 10 mg daily 3 week on, one-week  off, based on his renal function. -Revlimid dose increased to 25 mg during cycle 3 due to improvement in renal function. -Cycle 4 Revlimid 25 mg daily for 21 days beginning 07/11/2015   2. T12 pathologic compression fracture in diffuse myelomatous involvement in bones -Status post kyphoplasty and palliative radiation   -pamidronate every 4 weeks, first treatment given 05/24/2015   3. Back and rib pain secondary to multiple myeloma bone lesions  4. 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 He is on aspirin -follow up with Dr. Percival Spanish   5. Transaminitis -His ALT increased to 351, AST increased to 141 on 06/21/2015, possible related to treatment -Significant improvement after Velcade held 06/21/2015 -Velcade resumed on 06/28/2015 at a dose of 1 mg/m -ALT and AST in normal range today -Continue close monitoring  Disposition: Eric Dudley appears stable. The plan is to continue RVD. He is undergoing evaluation for transplant and has an appointment at [facility] on 07/18/2015.  He will be due for pamidronate 07/19/2015.  The rash at the bilateral thigh regions may be related to Revlimid. It appears to be resolving. He understands to contact the office if the rash increases or he develops other associated symptoms.  He will return for a follow-up visit on 07/26/2015. He will contact the office in the interim with any problems.  Plan reviewed with Dr. Burr Medico.    Ned Card ANP/GNP-BC   07/12/2015  10:45 AM

## 2015-07-12 NOTE — Patient Instructions (Signed)
[facility] Discharge Instructions for Patients Receiving Chemotherapy  Today you received the following chemotherapy agents Velcade.  To help prevent nausea and vomiting after your treatment, we encourage you to take your nausea medication Zofran 8 mg twice a day as needed.   If you develop nausea and vomiting that is not controlled by your nausea medication, call the clinic.   BELOW ARE SYMPTOMS THAT SHOULD BE REPORTED IMMEDIATELY:  *FEVER GREATER THAN 100.5 F  *CHILLS WITH OR WITHOUT FEVER  NAUSEA AND VOMITING THAT IS NOT CONTROLLED WITH YOUR NAUSEA MEDICATION  *UNUSUAL SHORTNESS OF BREATH  *UNUSUAL BRUISING OR BLEEDING  TENDERNESS IN MOUTH AND THROAT WITH OR WITHOUT PRESENCE OF ULCERS  *URINARY PROBLEMS  *BOWEL PROBLEMS  UNUSUAL RASH Items with * indicate a potential emergency and should be followed up as soon as possible.  Feel free to call the clinic you have any questions or concerns. The clinic phone number is [phone].  Please show the [place] at check-in to the Emergency Department and triage nurse.

## 2015-07-15 ENCOUNTER — Other Ambulatory Visit: Payer: Self-pay | Admitting: *Deleted

## 2015-07-15 DIAGNOSIS — C9000 Multiple myeloma not having achieved remission: Secondary | ICD-10-CM

## 2015-07-15 MED ORDER — LENALIDOMIDE 25 MG PO CAPS: 25.0000 mg | ORAL_CAPSULE | Freq: Every day | ORAL | Status: DC

## 2015-07-18 ENCOUNTER — Other Ambulatory Visit: Payer: Self-pay | Admitting: Hematology

## 2015-07-18 DIAGNOSIS — I351 Nonrheumatic aortic (valve) insufficiency: Secondary | ICD-10-CM | POA: Diagnosis not present

## 2015-07-18 DIAGNOSIS — Z01810 Encounter for preprocedural cardiovascular examination: Secondary | ICD-10-CM | POA: Diagnosis not present

## 2015-07-18 DIAGNOSIS — C9000 Multiple myeloma not having achieved remission: Secondary | ICD-10-CM | POA: Diagnosis not present

## 2015-07-18 DIAGNOSIS — I517 Cardiomegaly: Secondary | ICD-10-CM | POA: Diagnosis not present

## 2015-07-19 ENCOUNTER — Other Ambulatory Visit (HOSPITAL_BASED_OUTPATIENT_CLINIC_OR_DEPARTMENT_OTHER): Payer: Medicare Other

## 2015-07-19 ENCOUNTER — Ambulatory Visit (HOSPITAL_BASED_OUTPATIENT_CLINIC_OR_DEPARTMENT_OTHER): Payer: Medicare Other

## 2015-07-19 VITALS — BP 104/54 | HR 82 | Temp 97.9°F | Resp 16

## 2015-07-19 DIAGNOSIS — Z5112 Encounter for antineoplastic immunotherapy: Secondary | ICD-10-CM

## 2015-07-19 DIAGNOSIS — M810 Age-related osteoporosis without current pathological fracture: Secondary | ICD-10-CM

## 2015-07-19 DIAGNOSIS — C9000 Multiple myeloma not having achieved remission: Secondary | ICD-10-CM

## 2015-07-19 LAB — CBC & DIFF AND RETIC
BASO%: 0.1 % (ref 0.0–2.0)
Basophils Absolute: 0 10*3/uL (ref 0.0–0.1)
EOS%: 2.5 % (ref 0.0–7.0)
Eosinophils Absolute: 0.2 10*3/uL (ref 0.0–0.5)
HCT: 41.4 % (ref 38.4–49.9)
HGB: 13.8 g/dL (ref 13.0–17.1)
Immature Retic Fract: 11.2 % — ABNORMAL HIGH (ref 3.00–10.60)
LYMPH%: 3.6 % — ABNORMAL LOW (ref 14.0–49.0)
MCH: 33.4 pg (ref 27.2–33.4)
MCHC: 33.3 g/dL (ref 32.0–36.0)
MCV: 100.2 fL — AB (ref 79.3–98.0)
MONO#: 0.5 10*3/uL (ref 0.1–0.9)
MONO%: 5.3 % (ref 0.0–14.0)
NEUT#: 7.6 10*3/uL — ABNORMAL HIGH (ref 1.5–6.5)
NEUT%: 88.5 % — ABNORMAL HIGH (ref 39.0–75.0)
Platelets: 181 10*3/uL (ref 140–400)
RBC: 4.13 10*6/uL — AB (ref 4.20–5.82)
RDW: 16.2 % — AB (ref 11.0–14.6)
RETIC %: 3.83 % — AB (ref 0.80–1.80)
Retic Ct Abs: 158.18 10*3/uL — ABNORMAL HIGH (ref 34.80–93.90)
WBC: 8.6 10*3/uL (ref 4.0–10.3)
lymph#: 0.3 10*3/uL — ABNORMAL LOW (ref 0.9–3.3)

## 2015-07-19 LAB — COMPREHENSIVE METABOLIC PANEL (CC13)
ALT: 82 U/L — AB (ref 0–55)
ANION GAP: 7 meq/L (ref 3–11)
AST: 53 U/L — ABNORMAL HIGH (ref 5–34)
Albumin: 3.3 g/dL — ABNORMAL LOW (ref 3.5–5.0)
Alkaline Phosphatase: 197 U/L — ABNORMAL HIGH (ref 40–150)
BUN: 9.8 mg/dL (ref 7.0–26.0)
CALCIUM: 8.2 mg/dL — AB (ref 8.4–10.4)
CO2: 17 mEq/L — ABNORMAL LOW (ref 22–29)
Chloride: 111 mEq/L — ABNORMAL HIGH (ref 98–109)
Creatinine: 1.4 mg/dL — ABNORMAL HIGH (ref 0.7–1.3)
EGFR: 51 mL/min/{1.73_m2} — AB (ref >90)
Glucose: 102 mg/dl (ref 70–140)
Potassium: 4.2 mEq/L (ref 3.5–5.1)
SODIUM: 135 meq/L — AB (ref 136–145)
Total Bilirubin: 0.67 mg/dL (ref 0.20–1.20)
Total Protein: 6 g/dL — ABNORMAL LOW (ref 6.4–8.3)

## 2015-07-19 MED ORDER — BORTEZOMIB CHEMO SQ INJECTION 3.5 MG (2.5MG/ML)
1.0000 mg/m2 | Freq: Once | INTRAMUSCULAR | Status: AC
  Administered 2015-07-19: 2 mg via SUBCUTANEOUS
  Filled 2015-07-19: qty 2

## 2015-07-19 MED ORDER — ONDANSETRON HCL 8 MG PO TABS
8.0000 mg | ORAL_TABLET | Freq: Once | ORAL | Status: AC
  Administered 2015-07-19: 8 mg via ORAL

## 2015-07-19 MED ORDER — SODIUM CHLORIDE 0.9 % IV SOLN
90.0000 mg | Freq: Once | INTRAVENOUS | Status: AC
  Administered 2015-07-19: 90 mg via INTRAVENOUS
  Filled 2015-07-19: qty 10

## 2015-07-19 MED ORDER — SODIUM CHLORIDE 0.9 % IV SOLN
Freq: Once | INTRAVENOUS | Status: AC
  Administered 2015-07-19: 14:00:00 via INTRAVENOUS

## 2015-07-19 MED ORDER — ONDANSETRON HCL 8 MG PO TABS
ORAL_TABLET | ORAL | Status: AC
  Filled 2015-07-19: qty 1

## 2015-07-19 NOTE — Patient Instructions (Addendum)
Pamidronate injection What is this medicine? PAMIDRONATE (pa mi DROE nate) slows calcium loss from bones. It is used to treat high calcium blood levels from cancer or Paget's disease. It is also used to treat bone pain and prevent fractures from certain cancers that have spread to the bone. This medicine may be used for other purposes; ask your health care provider or pharmacist if you have questions. COMMON BRAND NAME(S): Aredia What should I tell my health care provider before I take this medicine? They need to know if you have any of these conditions: -aspirin-sensitive asthma -dental disease -kidney disease -an unusual or allergic reaction to pamidronate, other medicines, foods, dyes, or preservatives -pregnant or trying to get pregnant -breast-feeding How should I use this medicine? This medicine is for infusion into a vein. It is given by a health care professional in a hospital or clinic setting. Talk to your pediatrician regarding the use of this medicine in children. This medicine is not approved for use in children. Overdosage: If you think you have taken too much of this medicine contact a poison control center or emergency room at once. NOTE: This medicine is only for you. Do not share this medicine with others. What if I miss a dose? This does not apply. What may interact with this medicine? -certain antibiotics given by injection -medicines for inflammation or pain like ibuprofen, naproxen -some diuretics like bumetanide, furosemide -cyclosporine -parathyroid hormone -tacrolimus -teriparatide -thalidomide This list may not describe all possible interactions. Give your health care provider a list of all the medicines, herbs, non-prescription drugs, or dietary supplements you use. Also tell them if you smoke, drink alcohol, or use illegal drugs. Some items may interact with your medicine. What should I watch for while using this medicine? Visit your doctor or health care  professional for regular checkups. It may be some time before you see the benefit from this medicine. Do not stop taking your medicine unless your doctor tells you to. Your doctor may order blood tests or other tests to see how you are doing. Women should inform their doctor if they wish to become pregnant or think they might be pregnant. There is a potential for serious side effects to an unborn child. Talk to your health care professional or pharmacist for more information. You should make sure that you get enough calcium and vitamin D while you are taking this medicine. Discuss the foods you eat and the vitamins you take with your health care professional. Some people who take this medicine have severe bone, joint, and/or muscle pain. This medicine may also increase your risk for a broken thigh bone. Tell your doctor right away if you have pain in your upper leg or groin. Tell your doctor if you have any pain that does not go away or that gets worse. What side effects may I notice from receiving this medicine? Side effects that you should report to your doctor or health care professional as soon as possible: -allergic reactions like skin rash, itching or hives, swelling of the face, lips, or tongue -black or tarry stools -changes in vision -eye inflammation, pain -high blood pressure -jaw pain, especially burning or cramping -muscle weakness -numb, tingling pain -swelling of feet or hands -trouble passing urine or change in the amount of urine -unable to move easily Side effects that usually do not require medical attention (report to your doctor or health care professional if they continue or are bothersome): -bone, joint, or muscle pain -constipation -dizzy, drowsy -  fever -headache -loss of appetite -nausea, vomiting -pain at site where injected This list may not describe all possible side effects. Call your doctor for medical advice about side effects. You may report side effects to  FDA at 1-800-FDA-1088. Where should I keep my medicine? This drug is given in a hospital or clinic and will not be stored at home. NOTE: This sheet is a summary. It may not cover all possible information. If you have questions about this medicine, talk to your doctor, pharmacist, or health care provider.  2015, [employer] Standard. (2011-06-12 08:49:49)  Bortezomib injection What is this medicine? BORTEZOMIB (bor TEZ oh mib) is a chemotherapy drug. It slows the growth of cancer cells. This medicine is used to treat multiple myeloma, and certain lymphomas, such as mantle-cell lymphoma. This medicine may be used for other purposes; ask your health care provider or pharmacist if you have questions. COMMON BRAND NAME(S): Velcade What should I tell my health care provider before I take this medicine? They need to know if you have any of these conditions: -diabetes -heart disease -irregular heartbeat -liver disease -on hemodialysis -low blood counts, like low white blood cells, platelets, or hemoglobin -peripheral neuropathy -taking medicine for blood pressure -an unusual or allergic reaction to bortezomib, mannitol, boron, other medicines, foods, dyes, or preservatives -pregnant or trying to get pregnant -breast-feeding How should I use this medicine? This medicine is for injection into a vein or for injection under the skin. It is given by a health care professional in a hospital or clinic setting. Talk to your pediatrician regarding the use of this medicine in children. Special care may be needed. Overdosage: If you think you have taken too much of this medicine contact a poison control center or emergency room at once. NOTE: This medicine is only for you. Do not share this medicine with others. What if I miss a dose? It is important not to miss your dose. Call your doctor or health care professional if you are unable to keep an appointment. What may interact with this medicine? This  medicine may interact with the following medications: -ketoconazole -rifampin -ritonavir -St. John's Wort This list may not describe all possible interactions. Give your health care provider a list of all the medicines, herbs, non-prescription drugs, or dietary supplements you use. Also tell them if you smoke, drink alcohol, or use illegal drugs. Some items may interact with your medicine. What should I watch for while using this medicine? Visit your doctor for checks on your progress. This drug may make you feel generally unwell. This is not uncommon, as chemotherapy can affect healthy cells as well as cancer cells. Report any side effects. Continue your course of treatment even though you feel ill unless your doctor tells you to stop. You may get drowsy or dizzy. Do not drive, use machinery, or do anything that needs mental alertness until you know how this medicine affects you. Do not stand or sit up quickly, especially if you are an older patient. This reduces the risk of dizzy or fainting spells. In some cases, you may be given additional medicines to help with side effects. Follow all directions for their use. Call your doctor or health care professional for advice if you get a fever, chills or sore throat, or other symptoms of a cold or flu. Do not treat yourself. This drug decreases your body's ability to fight infections. Try to avoid being around people who are sick. This medicine may increase your risk to bruise  or bleed. Call your doctor or health care professional if you notice any unusual bleeding. You may need blood work done while you are taking this medicine. In some patients, this medicine may cause a serious brain infection that may cause death. If you have any problems seeing, thinking, speaking, walking, or standing, tell your doctor right away. If you cannot reach your doctor, urgently seek other source of medical care. Do not become pregnant while taking this medicine. Women  should inform their doctor if they wish to become pregnant or think they might be pregnant. There is a potential for serious side effects to an unborn child. Talk to your health care professional or pharmacist for more information. Do not breast-feed an infant while taking this medicine. Check with your doctor or health care professional if you get an attack of severe diarrhea, nausea and vomiting, or if you sweat a lot. The loss of too much body fluid can make it dangerous for you to take this medicine. What side effects may I notice from receiving this medicine? Side effects that you should report to your doctor or health care professional as soon as possible: -allergic reactions like skin rash, itching or hives, swelling of the face, lips, or tongue -breathing problems -changes in hearing -changes in vision -fast, irregular heartbeat -feeling faint or lightheaded, falls -pain, tingling, numbness in the hands or feet -right upper belly pain -seizures -swelling of the ankles, feet, hands -unusual bleeding or bruising -unusually weak or tired -vomiting -yellowing of the eyes or skin Side effects that usually do not require medical attention (report to your doctor or health care professional if they continue or are bothersome): -changes in emotions or moods -constipation -diarrhea -loss of appetite -headache -irritation at site where injected -nausea This list may not describe all possible side effects. Call your doctor for medical advice about side effects. You may report side effects to FDA at 1-800-FDA-1088. Where should I keep my medicine? This drug is given in a hospital or clinic and will not be stored at home. NOTE: This sheet is a summary. It may not cover all possible information. If you have questions about this medicine, talk to your doctor, pharmacist, or health care provider.  2015, [employer]/Gold Standard. (2013-10-09 12:46:32)  [facility]charge  Instructions for Patients Receiving Chemotherapy  Today you received the following chemotherapy agents Velcade.  To help prevent nausea and vomiting after your treatment, we encourage you to take your nausea medication as directed.   If you develop nausea and vomiting that is not controlled by your nausea medication, call the clinic.   BELOW ARE SYMPTOMS THAT SHOULD BE REPORTED IMMEDIATELY:  *FEVER GREATER THAN 100.5 F  *CHILLS WITH OR WITHOUT FEVER  NAUSEA AND VOMITING THAT IS NOT CONTROLLED WITH YOUR NAUSEA MEDICATION  *UNUSUAL SHORTNESS OF BREATH  *UNUSUAL BRUISING OR BLEEDING  TENDERNESS IN MOUTH AND THROAT WITH OR WITHOUT PRESENCE OF ULCERS  *URINARY PROBLEMS  *BOWEL PROBLEMS  UNUSUAL RASH Items with * indicate a potential emergency and should be followed up as soon as possible.  Feel free to call the clinic should you have any questions or concerns. The clinic phone number is [phone].  Please show the [place] at check-in to the Emergency Department and triage nurse.

## 2015-07-22 DIAGNOSIS — I10 Essential (primary) hypertension: Secondary | ICD-10-CM | POA: Diagnosis not present

## 2015-07-22 DIAGNOSIS — I252 Old myocardial infarction: Secondary | ICD-10-CM | POA: Diagnosis not present

## 2015-07-22 DIAGNOSIS — C9000 Multiple myeloma not having achieved remission: Secondary | ICD-10-CM | POA: Diagnosis not present

## 2015-07-22 DIAGNOSIS — Z923 Personal history of irradiation: Secondary | ICD-10-CM | POA: Diagnosis not present

## 2015-07-22 DIAGNOSIS — Z7901 Long term (current) use of anticoagulants: Secondary | ICD-10-CM | POA: Diagnosis not present

## 2015-07-22 DIAGNOSIS — R55 Syncope and collapse: Secondary | ICD-10-CM | POA: Diagnosis not present

## 2015-07-22 DIAGNOSIS — Z01810 Encounter for preprocedural cardiovascular examination: Secondary | ICD-10-CM | POA: Diagnosis not present

## 2015-07-22 DIAGNOSIS — Z79899 Other long term (current) drug therapy: Secondary | ICD-10-CM | POA: Diagnosis not present

## 2015-07-22 DIAGNOSIS — R0781 Pleurodynia: Secondary | ICD-10-CM | POA: Diagnosis not present

## 2015-07-22 DIAGNOSIS — I5181 Takotsubo syndrome: Secondary | ICD-10-CM | POA: Diagnosis not present

## 2015-07-22 DIAGNOSIS — Z7982 Long term (current) use of aspirin: Secondary | ICD-10-CM | POA: Diagnosis not present

## 2015-07-22 DIAGNOSIS — E785 Hyperlipidemia, unspecified: Secondary | ICD-10-CM | POA: Diagnosis not present

## 2015-07-22 DIAGNOSIS — R5383 Other fatigue: Secondary | ICD-10-CM | POA: Diagnosis not present

## 2015-07-22 DIAGNOSIS — I519 Heart disease, unspecified: Secondary | ICD-10-CM | POA: Diagnosis not present

## 2015-07-22 DIAGNOSIS — Z85818 Personal history of malignant neoplasm of other sites of lip, oral cavity, and pharynx: Secondary | ICD-10-CM | POA: Diagnosis not present

## 2015-07-22 DIAGNOSIS — Z9221 Personal history of antineoplastic chemotherapy: Secondary | ICD-10-CM | POA: Diagnosis not present

## 2015-07-22 DIAGNOSIS — I729 Aneurysm of unspecified site: Secondary | ICD-10-CM | POA: Diagnosis not present

## 2015-07-22 DIAGNOSIS — Z720 Tobacco use: Secondary | ICD-10-CM | POA: Diagnosis not present

## 2015-07-25 DIAGNOSIS — C9000 Multiple myeloma not having achieved remission: Secondary | ICD-10-CM | POA: Diagnosis not present

## 2015-07-26 ENCOUNTER — Telehealth: Payer: Self-pay | Admitting: *Deleted

## 2015-07-26 ENCOUNTER — Ambulatory Visit (HOSPITAL_BASED_OUTPATIENT_CLINIC_OR_DEPARTMENT_OTHER): Payer: Medicare Other | Admitting: Hematology

## 2015-07-26 ENCOUNTER — Encounter: Payer: Self-pay | Admitting: Hematology

## 2015-07-26 ENCOUNTER — Ambulatory Visit (HOSPITAL_BASED_OUTPATIENT_CLINIC_OR_DEPARTMENT_OTHER): Payer: Medicare Other

## 2015-07-26 ENCOUNTER — Telehealth: Payer: Self-pay | Admitting: Hematology

## 2015-07-26 ENCOUNTER — Other Ambulatory Visit (HOSPITAL_BASED_OUTPATIENT_CLINIC_OR_DEPARTMENT_OTHER): Payer: Medicare Other

## 2015-07-26 VITALS — BP 126/62 | HR 77 | Temp 98.5°F | Resp 18 | Ht 68.5 in | Wt 178.1 lb

## 2015-07-26 DIAGNOSIS — G893 Neoplasm related pain (acute) (chronic): Secondary | ICD-10-CM

## 2015-07-26 DIAGNOSIS — C9000 Multiple myeloma not having achieved remission: Secondary | ICD-10-CM

## 2015-07-26 DIAGNOSIS — Z5112 Encounter for antineoplastic immunotherapy: Secondary | ICD-10-CM | POA: Diagnosis present

## 2015-07-26 LAB — CBC & DIFF AND RETIC
BASO%: 0.5 % (ref 0.0–2.0)
BASOS ABS: 0 10*3/uL (ref 0.0–0.1)
EOS%: 4.1 % (ref 0.0–7.0)
Eosinophils Absolute: 0.4 10*3/uL (ref 0.0–0.5)
HCT: 44.8 % (ref 38.4–49.9)
HGB: 14.9 g/dL (ref 13.0–17.1)
IMMATURE RETIC FRACT: 6.3 % (ref 3.00–10.60)
LYMPH%: 3.5 % — ABNORMAL LOW (ref 14.0–49.0)
MCH: 33.4 pg (ref 27.2–33.4)
MCHC: 33.2 g/dL (ref 32.0–36.0)
MCV: 100.6 fL — ABNORMAL HIGH (ref 79.3–98.0)
MONO#: 0.7 10*3/uL (ref 0.1–0.9)
MONO%: 8.3 % (ref 0.0–14.0)
NEUT%: 83.6 % — ABNORMAL HIGH (ref 39.0–75.0)
NEUTROS ABS: 7.1 10*3/uL — AB (ref 1.5–6.5)
Platelets: 132 10*3/uL — ABNORMAL LOW (ref 140–400)
RBC: 4.45 10*6/uL (ref 4.20–5.82)
RDW: 16.4 % — ABNORMAL HIGH (ref 11.0–14.6)
RETIC %: 1.63 % (ref 0.80–1.80)
Retic Ct Abs: 72.54 10*3/uL (ref 34.80–93.90)
WBC: 8.5 10*3/uL (ref 4.0–10.3)
lymph#: 0.3 10*3/uL — ABNORMAL LOW (ref 0.9–3.3)

## 2015-07-26 LAB — COMPREHENSIVE METABOLIC PANEL (CC13)
ALT: 52 U/L (ref 0–55)
AST: 22 U/L (ref 5–34)
Albumin: 3.4 g/dL — ABNORMAL LOW (ref 3.5–5.0)
Alkaline Phosphatase: 191 U/L — ABNORMAL HIGH (ref 40–150)
Anion Gap: 7 mEq/L (ref 3–11)
BILIRUBIN TOTAL: 0.69 mg/dL (ref 0.20–1.20)
BUN: 11.8 mg/dL (ref 7.0–26.0)
CO2: 19 mEq/L — ABNORMAL LOW (ref 22–29)
CREATININE: 1.2 mg/dL (ref 0.7–1.3)
Calcium: 8.9 mg/dL (ref 8.4–10.4)
Chloride: 110 mEq/L — ABNORMAL HIGH (ref 98–109)
EGFR: 62 mL/min/{1.73_m2} — AB (ref >90)
GLUCOSE: 145 mg/dL — AB (ref 70–140)
POTASSIUM: 3.9 meq/L (ref 3.5–5.1)
Sodium: 135 mEq/L — ABNORMAL LOW (ref 136–145)
TOTAL PROTEIN: 6.2 g/dL — AB (ref 6.4–8.3)

## 2015-07-26 MED ORDER — ONDANSETRON HCL 8 MG PO TABS
8.0000 mg | ORAL_TABLET | Freq: Once | ORAL | Status: AC
  Administered 2015-07-26: 8 mg via ORAL

## 2015-07-26 MED ORDER — BORTEZOMIB CHEMO SQ INJECTION 3.5 MG (2.5MG/ML)
1.0000 mg/m2 | Freq: Once | INTRAMUSCULAR | Status: AC
  Administered 2015-07-26: 2 mg via SUBCUTANEOUS
  Filled 2015-07-26: qty 2

## 2015-07-26 MED ORDER — HYDROCODONE-ACETAMINOPHEN 7.5-325 MG PO TABS: 1.0000 | ORAL_TABLET | Freq: Four times a day (QID) | ORAL | Status: DC | PRN

## 2015-07-26 MED ORDER — ONDANSETRON HCL 8 MG PO TABS
ORAL_TABLET | ORAL | Status: AC
  Filled 2015-07-26: qty 1

## 2015-07-26 NOTE — Telephone Encounter (Signed)
Pt confirmed labs/chemo per 07/29 POF, gave pt AVS and Calendar... KJ, sent msg to add Velcade

## 2015-07-26 NOTE — Patient Instructions (Signed)
[place] Discharge Instructions for Patients Receiving Chemotherapy  Today you received the following chemotherapy agents Velcade.  To help prevent nausea and vomiting after your treatment, we encourage you to take your nausea medication Zofran 8 mg twice a day as needed.   If you develop nausea and vomiting that is not controlled by your nausea medication, call the clinic.   BELOW ARE SYMPTOMS THAT SHOULD BE REPORTED IMMEDIATELY:  *FEVER GREATER THAN 100.5 F  *CHILLS WITH OR WITHOUT FEVER  NAUSEA AND VOMITING THAT IS NOT CONTROLLED WITH YOUR NAUSEA MEDICATION  *UNUSUAL SHORTNESS OF BREATH  *UNUSUAL BRUISING OR BLEEDING  TENDERNESS IN MOUTH AND THROAT WITH OR WITHOUT PRESENCE OF ULCERS  *URINARY PROBLEMS  *BOWEL PROBLEMS  UNUSUAL RASH Items with * indicate a potential emergency and should be followed up as soon as possible.  Feel free to call the clinic you have any questions or concerns. The clinic phone number is [phone].  Please show the [place] at check-in to the Emergency Department and triage nurse.

## 2015-07-26 NOTE — Progress Notes (Signed)
[place]  [phone] Fax:([phone]) [place] Note   Patient Care Team: [name], MD as PCP - General (Internal Medicine) 06/28/2015  CHIEF COMPLAINTS: Follow-up multiple myeloma    Multiple myeloma   01/31/2015 Tumor Marker SPEP showed M-SPIKE 1.7g/dl, Cr 1.5, no anemia, hypercalcemia   03/19/2015 Imaging Bone survey showed no discrete lytic lesion, but there is osteopenia and calvarial heterogeneity.   03/27/2015 Initial Diagnosis Multiple myeloma, stage I   03/27/2015 Bone Marrow Biopsy Bone Marrow, Aspirate,Biopsy: - HYPERCELLULAR BONE MARROW FOR AGE WITH PLASMA CELL NEOPLASM 47% - SEE COMMENT. PERIPHERAL BLOOD: - NO SIGNIFICANT MORPHOLOGIC ABNORMALITIES. Diagnosis Note The bone marrow shows increased number of    03/27/2015 Miscellaneous bone marrow Cytogenetics: normal. FISH panel showed the presence of +4 and +11, this is consider standard risk of MM    04/16/2015 - 04/23/2015 Hospital Admission He was admitted for worsening back pain, was found to have a T12 pathological fracture. He underwent kyphoplasty on 04/19/2015.   04/19/2015 -  Chemotherapy RVD with weekly Velcade 1.3 mg/m, dexamethasone 40 mg, and Revlimid 10 mg daily on day 1-21, every 28 days, cycle 1 Revlimid was interupted by hospitalization, cycle 3 Revlimid dose increased to 25 mg daily   04/26/2015 - 05/01/2015 Hospital Admission He was admitted to [facility] for syncope episode during his radiation simulation. EKG showed ST elevation, troponin was positive, he underwent cardio catheterization which showed mild stenosis. No intervention was needed. echo showed EF 35-40%   04/29/2015 - 05/10/2015 Radiation Therapy palliative RT to T12, 20Gy in 10 fractions     HISTORY OF PRESENTING ILLNESS:  Eric Dudley 64 y.o. 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  He has been having low back pain for 3-4 months. He had food posinin 4 month ago, and had frequent diarrhea. He started noticed low back pain since then. The pain is not radiating to leg, he also has intermittent right rib pain, worse with cough and sneezing. He remains to be physically active, able to do all the activities, such as gardening work housework without much limitation, but he doesn't sings slowly because of back pain. He takes Tylenol as needed, does not like the necrotic pain medication. No night sweats, no fever or chillss, no weight loss.   He was seen by his primary care physician Dr. [name], MD and had lab test (see below). He also had bone density scan 2/25 which showed osteoporosis, has not been treated yet.  Current therapy: Weekly Velcade 1.3 mg/m and dexamethasone 40 mg, Revlimid 10 mg daily on day 1-21, every 28 days, started on 04/19/2015, Revlimid was held in the middle of the first cycle due to multiple hospitalization and radiation.   INTERIM HISTORY: Eric Dudley returns for follow-up. His pain is overall better, he has pain in the mid back and both side chest well when he changes his position, he takes tylenol and half vicodin 1-2 times a day. No other complains. He has excellent appetite and eats well. No other pain, neuropathy, nausea, dyspnea or other complaints. He had pre-transplant evaluation at [facility] last week, including a bone marrow biopsy, and is scheduled to see Dr. Norma Fredrickson for follow-up on August 8.   MEDICAL HISTORY:  Past Medical History  Diagnosis Date  . Hypertension   . Dyslipidemia   . Tonsillar cancer 2006  .  Cholelithiases   . Hyperlipidemia   . Chronic kidney disease     MILD,CHRONIC    SURGICAL HISTORY: Past Surgical History  Procedure Laterality Date  . Appendectomy  1962  . Shoulder surgery  1999    left  . Knee surgery  2012    right  . Cardiac catheterization N/A 04/30/2015    Procedure: Left Heart Cath  and Coronary Angiography;  Surgeon: Peter M Martinique, MD;  Location: [facility] INVASIVE CV LAB CUPID;  Service: Cardiovascular;  Laterality: N/A;  . Cardiolite myocardial perfusion study  04/16/03    NEGITIVE BRUCE PROTOCAL EXERCISE STRESS TEST. EF 70%. NO ISCHEMIA.    SOCIAL HISTORY: History   Social History  . Marital Status: Married    Spouse Name: N/A  . Number of Children: 1   . Years of Education: N/A   Occupational History  . A retired Therapist, occupational    Social History Main Topics  . Smoking status: Former Smoker    Types: Cigars    Quit date: 12/29/2003  . Smokeless tobacco: Former Systems developer    Types: Chew    Quit date: 12/28/1994  . Alcohol Use: Yes     Comment: occasional beer  . Drug Use: No  . Sexual Activity: Not on file   Other Topics Concern  . Not on file   Social History Narrative  . No narrative on file    FAMILY HISTORY: Family History  Problem Relation Age of Onset  . Cancer Mother 38    cervical cancer   . Cancer Father     unknown cancer   . Hypertension Sister   . Cancer Sister     melanoma     ALLERGIES:  is allergic to hydrochlorothiazide; asa; amoxicillin; and indapamide.  MEDICATIONS:  Current Outpatient Prescriptions  Medication Sig Dispense Refill  . acetaminophen (TYLENOL) 325 MG tablet Take 2 tablets (650 mg total) by mouth every 6 (six) hours as needed for mild pain, fever or headache.    [name] acyclovir (ZOVIRAX) 400 MG tablet Take 1 tablet (400 mg total) by mouth 2 (two) times daily. 180 tablet 1  . aspirin 81 MG chewable tablet Chew 1 tablet (81 mg total) by mouth daily.    . Bisacodyl (DULCOLAX PO) Take 3 capsules by mouth daily as needed (for constipation).     . BORTEZOMIB IJ Inject as directed once a week.    . calcium carbonate (OS-CAL) 600 MG TABS tablet Take 1,200 mg by mouth daily with breakfast.    . Cholecalciferol (VITAMIN D-3) 1000 UNITS CAPS Take 1,000 Units by mouth daily.    . clopidogrel (PLAVIX) 75 MG tablet Take 1 tablet  (75 mg total) by mouth daily. 90 tablet 3  . cyclobenzaprine (FLEXERIL) 5 MG tablet Take 1 tablet (5 mg total) by mouth 3 (three) times daily. 90 tablet 1  . dexamethasone (DECADRON) 4 MG tablet Take 10 tablets (40 mg total) by mouth once a week. Take 40 mg ( 10 tablets )  Once  A  Week  On  Day  Of  Velcade. 120 tablet 1  . ezetimibe-simvastatin (VYTORIN) 10-40 MG per tablet Take 1 tablet by mouth at bedtime.    [name] HYDROcodone-acetaminophen (NORCO) 7.5-325 MG per tablet Take 1 tablet by mouth every 6 (six) hours as needed for moderate pain. 30 tablet 0  . hydrOXYzine (VISTARIL) 25 MG capsule Take 25 mg by mouth as needed.    [name] lenalidomide (REVLIMID) 25 MG capsule Take 1 capsule (  25 mg total) by mouth daily. Take 46m by mouth daily for 7 more days -  In addition to previous script of 14 tablets  -  For a total of 21 days cycle. 7 capsule 0  . metoprolol succinate (TOPROL-XL) 25 MG 24 hr tablet Take 0.5 tablets (12.5 mg total) by mouth daily. 45 tablet 3  . Multiple Vitamins-Minerals (CENTRUM SILVER ULTRA MENS PO) Take 1 tablet by mouth daily.    . nitroGLYCERIN (NITROSTAT) 0.4 MG SL tablet Place 0.4 mg under the tongue every 5 (five) minutes as needed for chest pain.    .Marland Kitchenondansetron (ZOFRAN) 8 MG tablet Take 1 tablet (8 mg total) by mouth 2 (two) times daily as needed for nausea or vomiting. 60 tablet 1  . pamidronate (AREDIA) 30 MG injection Inject into the vein every 28 (twenty-eight) days.    . polyethylene glycol (MIRALAX / GLYCOLAX) packet Take 17 g by mouth 2 (two) times daily.    . potassium chloride SA (K-DUR,KLOR-CON) 20 MEQ tablet Take 3 tablets (60 mEq total) by mouth daily. 270 tablet 1  . ranitidine (ZANTAC) 150 MG tablet Take 2 tablets (300 mg total) by mouth at bedtime. 60 tablet 1  . sennosides-docusate sodium (SENOKOT-S) 8.6-50 MG tablet Take 2 tablets by mouth 2 (two) times daily.    .Marland Kitchenspironolactone (ALDACTONE) 25 MG tablet Take 0.5 tablets (12.5 mg total) by mouth daily. 45  tablet 3   No current facility-administered medications for this visit.    REVIEW OF SYSTEMS:   Constitutional: Denies fevers, chills or abnormal night sweats Eyes: Denies blurriness of vision, double vision or watery eyes Ears, nose, mouth, throat, and face: Denies mucositis or sore throat Respiratory: Denies cough, dyspnea or wheezes Cardiovascular: Denies palpitation, chest discomfort or lower extremity swelling Gastrointestinal:  Denies nausea, heartburn or change in bowel habits Skin: Denies abnormal skin rashes Lymphatics: Denies new lymphadenopathy or easy bruising Neurological:Denies numbness, tingling or new weaknesses Behavioral/Psych: Mood is stable, no new changes  Musculoskeletal: Positive for back pain and intermittent rib pain All other systems were reviewed with the patient and are negative.  PHYSICAL EXAMINATION: ECOG PERFORMANCE STATUS: 1 - Symptomatic but completely ambulatory BP 126/62 mmHg  Pulse 77  Temp(Src) 98.5 F (36.9 C) (Oral)  Resp 18  Ht 5' 8.5" (1.74 m)  Wt 178 lb 1.6 oz (80.786 kg)  BMI 26.68 kg/m2  SpO2 98%  GENERAL: Today on the table, drowsy, arousable, no distress and comfortable SKIN: skin color, texture, turgor are normal, no rashes or significant lesions EYES: normal, conjunctiva are pink and non-injected, sclera clear OROPHARYNX:no exudate, no erythema and lips, buccal mucosa, and tongue normal  NECK: supple, thyroid normal size, non-tender, without nodularity LYMPH:  no palpable lymphadenopathy in the cervical, axillary or inguinal LUNGS: clear to auscultation and percussion with normal breathing effort HEART: regular rate & rhythm and no murmurs and no lower extremity edema ABDOMEN:abdomen soft, non-tender and normal bowel sounds Musculoskeletal:no cyanosis of digits and no clubbing, mild tenderness on bilateral lateral chest wall  PSYCH: alert & oriented x 3 with fluent speech NEURO: no focal motor/sensory deficits  LABORATORY  DATA:   CBC Latest Ref Rng 07/26/2015 07/19/2015 07/12/2015  WBC 4.0 - 10.3 10e3/uL 8.5 8.6 8.1  Hemoglobin 13.0 - 17.1 g/dL 14.9 13.8 14.4  Hematocrit 38.4 - 49.9 % 44.8 41.4 43.3  Platelets 140 - 400 10e3/uL 132(L) 181 239   CMP Latest Ref Rng 07/26/2015 07/19/2015 07/12/2015  Glucose 70 - 140 mg/dl 145(H)  102 134  BUN 7.0 - 26.0 mg/dL 11.8 9.8 6.6(L)  Creatinine 0.7 - 1.3 mg/dL 1.2 1.4(H) 1.2  Sodium 136 - 145 mEq/L 135(L) 135(L) 135(L)  Potassium 3.5 - 5.1 mEq/L 3.9 4.2 4.1  Chloride 101 - 111 mmol/L - - -  CO2 22 - 29 mEq/L 19(L) 17(L) 18(L)  Calcium 8.4 - 10.4 mg/dL 8.9 8.2(L) 8.7  Total Protein 6.4 - 8.3 g/dL 6.2(L) 6.0(L) 6.1(L)  Total Bilirubin 0.20 - 1.20 mg/dL 0.69 0.67 0.51  Alkaline Phos 40 - 150 U/L 191(H) 197(H) 202(H)  AST 5 - 34 U/L 22 53(H) 21  ALT 0 - 55 U/L 52 82(H) 35   MM lab: SPEP M-protein (g/dl) 03/15/2015: 1.6 05/24/2015: 0.5 06/21/2015: 0.4 07/18/2015 (at [facility]): 0.33  Serum IgG (mg/dl) 03/15/2015: 2000 05/24/2015: 579 06/21/2015: 673 07/18/2015 ([facility]): 509  Serum kappa/lamda/ratio 03/15/2015: 690, 0.13, 5308 05/24/2015: 146, 1.27, 115.0 06/21/2015: 86, 0.74, 116.2  24 h urine kappa light chain (mg) 03/18/2015: 2434 05/28/2015: 582 06/24/2015: 322   PATHOLOGY REPORT Bone Marrow, Aspirate,Biopsy, and Clot, right iliac 03/27/2015 - HYPERCELLULAR BONE MARROW FOR AGE WITH PLASMA CELL NEOPLASM. - SEE COMMENT. PERIPHERAL BLOOD: - NO SIGNIFICANT MORPHOLOGIC ABNORMALITIES. Diagnosis Note The bone marrow shows increased number of atypical plasma cells representing 47% of all cells in the aspirate associated with prominent interstitial infiltrates and variably sized aggregates in the clot and biopsy sections. Immunohistochemical stains show that the plasma cells are kappa light chain restricted consistent with plasma cell neoplasm. The background shows trilineage  Cytogenetics: normal FISH panel: +4 and +11  RADIOGRAPHIC STUDIES: Bone survey  03/19/2015 IMPRESSION: No discrete lytic lesion, but there is osteopenia and calvarial heterogeneity -nonspecific findings which can be manifestations of Myeloma  ASSESSMENT & PLAN:  64 year old Caucasian male with past medical history of hypertension, tonsil cancer status post surgery and radiation 10 years ago, now presented with 3-4 months low back pain and intermittent rib pain. His blood test showed M protein 1.7g/dl.  1. IgG multiple myeloma, stage I, standard risk by cytogenetics (+4 and +11) -I discussed his bone marrow biopsy results with him. He has significant amount of plasma cells in the bone marrow 47%.  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 Based on the new notable myeloma diagnostic criteria, he meets the criteria of multiple myeloma. He has normal albumin and miildly elevated beta 2 microglobulin, this is stage I. -His cytogenetics and Fish panel revealed standard risk. -His thoracic and lumbar spine MRI showed pathologic compression fracture at T12, focal myeloma deposits at L3 and T9, diffuse marrow signal abnormality consistent with myeloma -I recommended induction chemotherapy with VRD regimen, weekly Velcade and dexamethasone, first cycle Revlimid dose will be 10 mg daily 3 week on, one-week off, based on his renal function, which was increased to full dose from cycle 3. -The goal of treatment is disease control, potential cure with bone marrow transplant -He is a candidate for bone marrow transplant, has been seen by Dr. Norma Fredrickson at [facility], he is currently undergoing pre-transplant evaluation -His PET scan has been canceled due to his hospitalization.  -He has had at least a partial response, based on his  lab evaluation in the middle of cycle 4, history of PE the bone marrow biopsy results still pending. -  He is going to see Dr. Norma Fredrickson on August 8th, a decision on proceeding with bone marrow transplant versus more chemotherapy will be made on that visit. -He will finish the current cycle 4 treatment on August 5th   2. T12 pathologic compression fracture, and diffuse myeloma involvement in bones -Status post kyphoplasty and palliative radiation  -He received palliative Radiation, pain much improved  -continue pamidronate every 4 weeks, last dose 7/22  3. Back and rib pain, secondary to multiple myeloma bone lesions  -improved  -Continue Norco and tylenol as needed  4 nonobstructive CAD, STEMI, Non ischemic CM, Takatsubo, with EF of 35-40% -Patient was initially found to have troponin elevation and anterior apical ST elevation MI and resultant cardiomyopathy on 04/26/15 -He underwent a cardiac catheterization on 5/3 which showed mild diffuse disease, no stents were placed. He is on aspirin -follow up with Dr. Percival Spanish -He needs to repeat echo before transplant   5. Transaminitis -His ALT increased to 351, AST increased to 141 on 06/21/2015, possible related to treatment -resolved  -Continue close monitoring  Follow up: -continue cycle 4 VRD, last Velcade on August 5 -He is going to see Dr. Norma Fredrickson at [facility] on August 8, possible stem cell transplant afterwards -We'll decide his follow-up appointment with me after his follow-up with Dr. Debbrah Alar   All questions were answered. The patient knows to call the clinic with any problems, questions or concerns.  I spent 25 minutes counseling the patient face to face. The total time spent in the appointment was 30 minutes and more than 50% was on counseling.     Truitt Merle, MD 07/26/2015

## 2015-07-26 NOTE — Telephone Encounter (Signed)
Per staff message and POF I have scheduled appts. Advised scheduler of appts and to move labs. JMW  

## 2015-08-02 ENCOUNTER — Ambulatory Visit (HOSPITAL_BASED_OUTPATIENT_CLINIC_OR_DEPARTMENT_OTHER): Payer: Medicare Other

## 2015-08-02 ENCOUNTER — Other Ambulatory Visit (HOSPITAL_BASED_OUTPATIENT_CLINIC_OR_DEPARTMENT_OTHER): Payer: Medicare Other

## 2015-08-02 VITALS — BP 124/61 | HR 58 | Temp 97.9°F | Resp 22

## 2015-08-02 DIAGNOSIS — Z5112 Encounter for antineoplastic immunotherapy: Secondary | ICD-10-CM | POA: Diagnosis present

## 2015-08-02 DIAGNOSIS — C9000 Multiple myeloma not having achieved remission: Secondary | ICD-10-CM | POA: Diagnosis present

## 2015-08-02 LAB — CBC & DIFF AND RETIC
BASO%: 0.5 % (ref 0.0–2.0)
Basophils Absolute: 0 10*3/uL (ref 0.0–0.1)
EOS%: 17.1 % — AB (ref 0.0–7.0)
Eosinophils Absolute: 0.7 10*3/uL — ABNORMAL HIGH (ref 0.0–0.5)
HCT: 43.2 % (ref 38.4–49.9)
HEMOGLOBIN: 14.5 g/dL (ref 13.0–17.1)
Immature Retic Fract: 8.5 % (ref 3.00–10.60)
LYMPH#: 0.4 10*3/uL — AB (ref 0.9–3.3)
LYMPH%: 10 % — ABNORMAL LOW (ref 14.0–49.0)
MCH: 34 pg — AB (ref 27.2–33.4)
MCHC: 33.6 g/dL (ref 32.0–36.0)
MCV: 101.4 fL — AB (ref 79.3–98.0)
MONO#: 0.9 10*3/uL (ref 0.1–0.9)
MONO%: 20.8 % — AB (ref 0.0–14.0)
NEUT#: 2.2 10*3/uL (ref 1.5–6.5)
NEUT%: 51.6 % (ref 39.0–75.0)
Platelets: 130 10*3/uL — ABNORMAL LOW (ref 140–400)
RBC: 4.26 10*6/uL (ref 4.20–5.82)
RDW: 15.8 % — AB (ref 11.0–14.6)
Retic %: 2.27 % — ABNORMAL HIGH (ref 0.80–1.80)
Retic Ct Abs: 96.7 10*3/uL — ABNORMAL HIGH (ref 34.80–93.90)
WBC: 4.3 10*3/uL (ref 4.0–10.3)

## 2015-08-02 MED ORDER — ONDANSETRON HCL 8 MG PO TABS
ORAL_TABLET | ORAL | Status: AC
  Filled 2015-08-02: qty 1

## 2015-08-02 MED ORDER — BORTEZOMIB CHEMO SQ INJECTION 3.5 MG (2.5MG/ML)
1.0000 mg/m2 | Freq: Once | INTRAMUSCULAR | Status: AC
  Administered 2015-08-02: 2 mg via SUBCUTANEOUS
  Filled 2015-08-02: qty 2

## 2015-08-02 MED ORDER — ONDANSETRON HCL 8 MG PO TABS
8.0000 mg | ORAL_TABLET | Freq: Once | ORAL | Status: AC
  Administered 2015-08-02: 8 mg via ORAL

## 2015-08-02 NOTE — Progress Notes (Signed)
Per Dr. Burr Medico, okay to tx with 7/29 CMET

## 2015-08-02 NOTE — Patient Instructions (Signed)
[facility] Discharge Instructions for Patients Receiving Chemotherapy  Today you received the following chemotherapy agents: Velcade  To help prevent nausea and vomiting after your treatment, we encourage you to take your nausea medication as prescribed by your physician.   If you develop nausea and vomiting that is not controlled by your nausea medication, call the clinic.   BELOW ARE SYMPTOMS THAT SHOULD BE REPORTED IMMEDIATELY:  *FEVER GREATER THAN 100.5 F  *CHILLS WITH OR WITHOUT FEVER  NAUSEA AND VOMITING THAT IS NOT CONTROLLED WITH YOUR NAUSEA MEDICATION  *UNUSUAL SHORTNESS OF BREATH  *UNUSUAL BRUISING OR BLEEDING  TENDERNESS IN MOUTH AND THROAT WITH OR WITHOUT PRESENCE OF ULCERS  *URINARY PROBLEMS  *BOWEL PROBLEMS  UNUSUAL RASH Items with * indicate a potential emergency and should be followed up as soon as possible.  Feel free to call the clinic you have any questions or concerns. The clinic phone number is [phone].  Please show the CHEMO ALERT CARD at check-in to the Emergency Department and triage nurse.   

## 2015-08-05 DIAGNOSIS — C9000 Multiple myeloma not having achieved remission: Secondary | ICD-10-CM | POA: Diagnosis not present

## 2015-08-05 DIAGNOSIS — R55 Syncope and collapse: Secondary | ICD-10-CM | POA: Diagnosis not present

## 2015-08-08 DIAGNOSIS — Z7982 Long term (current) use of aspirin: Secondary | ICD-10-CM | POA: Diagnosis not present

## 2015-08-08 DIAGNOSIS — Z9889 Other specified postprocedural states: Secondary | ICD-10-CM | POA: Diagnosis not present

## 2015-08-08 DIAGNOSIS — C9000 Multiple myeloma not having achieved remission: Secondary | ICD-10-CM | POA: Diagnosis not present

## 2015-08-09 DIAGNOSIS — C9000 Multiple myeloma not having achieved remission: Secondary | ICD-10-CM | POA: Diagnosis not present

## 2015-08-10 DIAGNOSIS — C9000 Multiple myeloma not having achieved remission: Secondary | ICD-10-CM | POA: Diagnosis not present

## 2015-08-11 DIAGNOSIS — C9000 Multiple myeloma not having achieved remission: Secondary | ICD-10-CM | POA: Diagnosis not present

## 2015-08-12 DIAGNOSIS — C9000 Multiple myeloma not having achieved remission: Secondary | ICD-10-CM | POA: Diagnosis not present

## 2015-08-13 DIAGNOSIS — C9000 Multiple myeloma not having achieved remission: Secondary | ICD-10-CM | POA: Diagnosis not present

## 2015-08-14 DIAGNOSIS — C9000 Multiple myeloma not having achieved remission: Secondary | ICD-10-CM | POA: Diagnosis not present

## 2015-08-20 DIAGNOSIS — Z7682 Awaiting organ transplant status: Secondary | ICD-10-CM | POA: Diagnosis not present

## 2015-08-20 DIAGNOSIS — C9000 Multiple myeloma not having achieved remission: Secondary | ICD-10-CM | POA: Diagnosis not present

## 2015-08-21 DIAGNOSIS — C9000 Multiple myeloma not having achieved remission: Secondary | ICD-10-CM | POA: Diagnosis not present

## 2015-08-22 DIAGNOSIS — Z452 Encounter for adjustment and management of vascular access device: Secondary | ICD-10-CM | POA: Diagnosis not present

## 2015-08-22 DIAGNOSIS — T451X5A Adverse effect of antineoplastic and immunosuppressive drugs, initial encounter: Secondary | ICD-10-CM | POA: Diagnosis present

## 2015-08-22 DIAGNOSIS — R197 Diarrhea, unspecified: Secondary | ICD-10-CM | POA: Diagnosis not present

## 2015-08-22 DIAGNOSIS — R5081 Fever presenting with conditions classified elsewhere: Secondary | ICD-10-CM | POA: Diagnosis not present

## 2015-08-22 DIAGNOSIS — R6510 Systemic inflammatory response syndrome (SIRS) of non-infectious origin without acute organ dysfunction: Secondary | ICD-10-CM | POA: Diagnosis not present

## 2015-08-22 DIAGNOSIS — Z87891 Personal history of nicotine dependence: Secondary | ICD-10-CM | POA: Diagnosis not present

## 2015-08-22 DIAGNOSIS — F05 Delirium due to known physiological condition: Secondary | ICD-10-CM | POA: Diagnosis not present

## 2015-08-22 DIAGNOSIS — I10 Essential (primary) hypertension: Secondary | ICD-10-CM | POA: Diagnosis not present

## 2015-08-22 DIAGNOSIS — M40209 Unspecified kyphosis, site unspecified: Secondary | ICD-10-CM | POA: Diagnosis present

## 2015-08-22 DIAGNOSIS — I358 Other nonrheumatic aortic valve disorders: Secondary | ICD-10-CM | POA: Diagnosis not present

## 2015-08-22 DIAGNOSIS — E785 Hyperlipidemia, unspecified: Secondary | ICD-10-CM | POA: Diagnosis present

## 2015-08-22 DIAGNOSIS — I501 Left ventricular failure: Secondary | ICD-10-CM | POA: Diagnosis not present

## 2015-08-22 DIAGNOSIS — Z85818 Personal history of malignant neoplasm of other sites of lip, oral cavity, and pharynx: Secondary | ICD-10-CM | POA: Diagnosis not present

## 2015-08-22 DIAGNOSIS — K5900 Constipation, unspecified: Secondary | ICD-10-CM | POA: Diagnosis present

## 2015-08-22 DIAGNOSIS — C9000 Multiple myeloma not having achieved remission: Secondary | ICD-10-CM | POA: Diagnosis not present

## 2015-08-22 DIAGNOSIS — I517 Cardiomegaly: Secondary | ICD-10-CM | POA: Diagnosis not present

## 2015-08-22 DIAGNOSIS — R079 Chest pain, unspecified: Secondary | ICD-10-CM | POA: Diagnosis not present

## 2015-08-22 DIAGNOSIS — D709 Neutropenia, unspecified: Secondary | ICD-10-CM | POA: Diagnosis not present

## 2015-08-22 DIAGNOSIS — D61810 Antineoplastic chemotherapy induced pancytopenia: Secondary | ICD-10-CM | POA: Diagnosis not present

## 2015-08-22 DIAGNOSIS — I429 Cardiomyopathy, unspecified: Secondary | ICD-10-CM | POA: Diagnosis present

## 2015-08-22 DIAGNOSIS — M810 Age-related osteoporosis without current pathological fracture: Secondary | ICD-10-CM | POA: Diagnosis present

## 2015-08-22 DIAGNOSIS — R000 Tachycardia, unspecified: Secondary | ICD-10-CM | POA: Diagnosis not present

## 2015-08-22 DIAGNOSIS — K219 Gastro-esophageal reflux disease without esophagitis: Secondary | ICD-10-CM | POA: Diagnosis not present

## 2015-08-22 DIAGNOSIS — M545 Low back pain: Secondary | ICD-10-CM | POA: Diagnosis not present

## 2015-08-22 DIAGNOSIS — Z9484 Stem cells transplant status: Secondary | ICD-10-CM | POA: Diagnosis not present

## 2015-08-22 DIAGNOSIS — R0600 Dyspnea, unspecified: Secondary | ICD-10-CM | POA: Diagnosis not present

## 2015-08-22 DIAGNOSIS — R112 Nausea with vomiting, unspecified: Secondary | ICD-10-CM | POA: Diagnosis not present

## 2015-08-22 DIAGNOSIS — M8580 Other specified disorders of bone density and structure, unspecified site: Secondary | ICD-10-CM | POA: Diagnosis present

## 2015-08-22 DIAGNOSIS — R6520 Severe sepsis without septic shock: Secondary | ICD-10-CM | POA: Diagnosis not present

## 2015-08-22 DIAGNOSIS — G893 Neoplasm related pain (acute) (chronic): Secondary | ICD-10-CM | POA: Diagnosis present

## 2015-08-22 DIAGNOSIS — E876 Hypokalemia: Secondary | ICD-10-CM | POA: Diagnosis not present

## 2015-09-05 ENCOUNTER — Other Ambulatory Visit: Payer: Self-pay | Admitting: *Deleted

## 2015-09-05 ENCOUNTER — Telehealth: Payer: Self-pay | Admitting: Hematology

## 2015-09-05 DIAGNOSIS — C9000 Multiple myeloma not having achieved remission: Secondary | ICD-10-CM

## 2015-09-05 NOTE — Telephone Encounter (Signed)
Dr office cld to make appts fo pt-gave time & date for appt and fax # for lab orders

## 2015-09-06 ENCOUNTER — Telehealth: Payer: Self-pay | Admitting: Internal Medicine

## 2015-09-06 NOTE — Telephone Encounter (Signed)
Received records from [facility] for appointment on 09/09/15 with Dr Debara Pickett.  Records given to [facility] (medical records) for Dr Lysbeth Penner schedule on 09/09/15.

## 2015-09-09 ENCOUNTER — Ambulatory Visit (INDEPENDENT_AMBULATORY_CARE_PROVIDER_SITE_OTHER): Payer: Medicare Other | Admitting: Internal Medicine

## 2015-09-09 ENCOUNTER — Encounter: Payer: Self-pay | Admitting: Internal Medicine

## 2015-09-09 ENCOUNTER — Other Ambulatory Visit (HOSPITAL_BASED_OUTPATIENT_CLINIC_OR_DEPARTMENT_OTHER): Payer: Medicare Other

## 2015-09-09 ENCOUNTER — Encounter: Payer: Self-pay | Admitting: Hematology

## 2015-09-09 ENCOUNTER — Ambulatory Visit (HOSPITAL_BASED_OUTPATIENT_CLINIC_OR_DEPARTMENT_OTHER): Payer: Medicare Other | Admitting: Hematology

## 2015-09-09 ENCOUNTER — Telehealth: Payer: Self-pay | Admitting: Hematology

## 2015-09-09 VITALS — BP 104/56 | HR 101 | Temp 97.6°F | Resp 17 | Ht 68.5 in

## 2015-09-09 VITALS — BP 96/70 | HR 103 | Ht 68.0 in | Wt 157.8 lb

## 2015-09-09 DIAGNOSIS — R530 Neoplastic (malignant) related fatigue: Secondary | ICD-10-CM | POA: Diagnosis not present

## 2015-09-09 DIAGNOSIS — I214 Non-ST elevation (NSTEMI) myocardial infarction: Secondary | ICD-10-CM

## 2015-09-09 DIAGNOSIS — C9000 Multiple myeloma not having achieved remission: Secondary | ICD-10-CM

## 2015-09-09 DIAGNOSIS — I10 Essential (primary) hypertension: Secondary | ICD-10-CM | POA: Diagnosis not present

## 2015-09-09 DIAGNOSIS — G893 Neoplasm related pain (acute) (chronic): Secondary | ICD-10-CM

## 2015-09-09 DIAGNOSIS — R630 Anorexia: Secondary | ICD-10-CM | POA: Diagnosis not present

## 2015-09-09 DIAGNOSIS — I429 Cardiomyopathy, unspecified: Secondary | ICD-10-CM | POA: Diagnosis not present

## 2015-09-09 DIAGNOSIS — I428 Other cardiomyopathies: Secondary | ICD-10-CM

## 2015-09-09 LAB — CBC & DIFF AND RETIC
BASO%: 0.5 % (ref 0.0–2.0)
Basophils Absolute: 0 10*3/uL (ref 0.0–0.1)
EOS ABS: 0 10*3/uL (ref 0.0–0.5)
EOS%: 0 % (ref 0.0–7.0)
HCT: 32.1 % — ABNORMAL LOW (ref 38.4–49.9)
HEMOGLOBIN: 10.8 g/dL — AB (ref 13.0–17.1)
Immature Retic Fract: 24.5 % — ABNORMAL HIGH (ref 3.00–10.60)
LYMPH%: 17.7 % (ref 14.0–49.0)
MCH: 34.3 pg — AB (ref 27.2–33.4)
MCHC: 33.7 g/dL (ref 32.0–36.0)
MCV: 102 fL — AB (ref 79.3–98.0)
MONO#: 1 10*3/uL — ABNORMAL HIGH (ref 0.1–0.9)
MONO%: 20.7 % — AB (ref 0.0–14.0)
NEUT%: 61.1 % (ref 39.0–75.0)
NEUTROS ABS: 2.8 10*3/uL (ref 1.5–6.5)
Platelets: 77 10*3/uL — ABNORMAL LOW (ref 140–400)
RBC: 3.14 10*6/uL — ABNORMAL LOW (ref 4.20–5.82)
RDW: 19.2 % — AB (ref 11.0–14.6)
Retic %: 3.62 % — ABNORMAL HIGH (ref 0.80–1.80)
Retic Ct Abs: 113.67 10*3/uL — ABNORMAL HIGH (ref 34.80–93.90)
WBC: 4.6 10*3/uL (ref 4.0–10.3)
lymph#: 0.8 10*3/uL — ABNORMAL LOW (ref 0.9–3.3)

## 2015-09-09 LAB — MAGNESIUM (CC13): MAGNESIUM: 1.6 mg/dL (ref 1.5–2.5)

## 2015-09-09 LAB — COMPREHENSIVE METABOLIC PANEL (CC13)
ALBUMIN: 3.1 g/dL — AB (ref 3.5–5.0)
ALK PHOS: 176 U/L — AB (ref 40–150)
ALT: 55 U/L (ref 0–55)
AST: 63 U/L — ABNORMAL HIGH (ref 5–34)
Anion Gap: 9 mEq/L (ref 3–11)
BUN: 3.7 mg/dL — ABNORMAL LOW (ref 7.0–26.0)
CALCIUM: 8 mg/dL — AB (ref 8.4–10.4)
CHLORIDE: 113 meq/L — AB (ref 98–109)
CO2: 18 mEq/L — ABNORMAL LOW (ref 22–29)
Creatinine: 1 mg/dL (ref 0.7–1.3)
EGFR: 83 mL/min/{1.73_m2} — AB (ref >90)
GLUCOSE: 110 mg/dL (ref 70–140)
POTASSIUM: 3.7 meq/L (ref 3.5–5.1)
SODIUM: 140 meq/L (ref 136–145)
Total Bilirubin: 0.63 mg/dL (ref 0.20–1.20)
Total Protein: 5.6 g/dL — ABNORMAL LOW (ref 6.4–8.3)

## 2015-09-09 LAB — TECHNOLOGIST REVIEW

## 2015-09-09 NOTE — Patient Instructions (Signed)
Medication Instructions:   STOP THE ALDACTONE    Follow-Up:  4 MONTHS WITH DR. HILTY

## 2015-09-09 NOTE — Progress Notes (Signed)
Faxed new pt referral to [facility] as per Dr. Ernestina Penna order -  Order and demographics along with insurance information. [facility] Intake New Referral     Phone     [phone]    ;     Fax        [phone].  Faxed lab results done today to Barbette Reichmann, RN @ [facility]     [phone].

## 2015-09-09 NOTE — Progress Notes (Signed)
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 He has been on Vytorin 10/40 mg. Recently, a lipid NMR demonstrated a high particle number at 1323 with an LDL cholesterol of 67, indicating increased risk. He also underwent a [place] heart protocol per your request, which showed an elevated lipoprotein(a) at 155 and elevated insulin level, suggesting early insulin resistance. Hemoglobin was 5.8. APOB levels were mildly elevated as well as APOA1 fractions. However, overall cholesterol control is fairly good. In the past, I had recommended changing him to Crestor; however, apparently he was intolerant to this in the past as well as Lipitor and another statin, which he does not recall. He is on low-dose Diovan for hypertension and had been on Norvasc in the past. The Diovan was apparently started by Dr. Einar Gip prior to my caring for him and I feel that this is a reasonable medicine for blood pressure control. Symptomatically, he denies any chest pain, shortness of breath, palpitations, presyncope, or syncopal symptoms.   I the pleasure see Eric Dudley back in the office today. He was recently hospitalized after presenting with a syncopal event. He was admitted on 4/19 for acute back pain where he was found to have a pathological T12 fracture IR consulted and recommended kyphoplasty which was performed on 04/19/15.Patient came to his regular scheduled injection of Velcade at the cancer center 4/29 and was being mapped for XRT and while being transferred from chair to table he was dropped down below usual 30. Subsequent to this he had an episode wherein he had severe pain and became unresponsive for about 20 seconds. EKG demonstrated anteroapical  ST elevation and he had car catheterization on 04/30/2015. This demonstratedProx RCA lesion, 30% stenosed. Ost LM to LM lesion, 30% stenosed, normal LVEDP. Echocardiography, however demonstrated reduced LV function with an EF of 35-40% and a large anteroapical aneurysm. The etiology of this aneurysm is not quite clear but thought to be related to prior radiation therapy and or chemotherapy for tonsillar cancer. He did suffer an NSTEMI, with troponin elevation to greater than 4. He was placed on aspirin and appropriately Plavix. There was some question as to whether he needed to continue this, however based on the CURE trial data, there is indication for therapy at least one year even in patients who did not have prior stenting. There is also new are data using Brilinta with similar if not better outcome data. Currently he reports some fatigue and had an episode yesterday where he vomited number of times and is somewhat weak today. Blood pressure is low normal.  Eric Dudley returns today for follow-up. He has been diagnosed with multiple myeloma and recently underwent bone marrow transplant. During that time he had congestive heart failure exacerbation. Prior to transplant his EF has improved and in fact a repeat echocardiogram performed on 9 7 2016 demonstrated an EF of 65-70%.  He's not currently on heart failure medication other than low-dose metoprolol XL 12.5 mg daily due to hypotension.  He is also on low-dose aspirin 81 mg and Plavix 75 mg.   As a reminder, there is no significant obstructive disease by cath in April and the dual antiplatelets therapy is for non-STEMI.[name]  PMHx:  Past Medical History  Diagnosis Date  .  Hypertension   . Dyslipidemia   . Tonsillar cancer 2006  . Cholelithiases   . Hyperlipidemia   . Chronic kidney disease     [place]    Past Surgical History  Procedure Laterality Date  . Appendectomy  1962  . Shoulder surgery  1999    left  . Knee surgery  2012    right    . Cardiac catheterization N/A 04/30/2015    Procedure: Left Heart Cath and Coronary Angiography;  Surgeon: Peter M Martinique, MD;  Location: [facility] INVASIVE CV LAB CUPID;  Service: Cardiovascular;  Laterality: N/A;  . Cardiolite myocardial perfusion study  04/16/03    NEGITIVE BRUCE PROTOCAL EXERCISE STRESS TEST. EF 70%. NO ISCHEMIA.    FAMHx:  Family History  Problem Relation Age of Onset  . Cancer Mother 44    cervical cancer   . Cancer Father     unknown cancer   . Hypertension Sister   . Cancer Sister     melanoma     SOCHx:   reports that he quit smoking about 11 years ago. His smoking use included Cigars. He quit smokeless tobacco use about 20 years ago. His smokeless tobacco use included Chew. He reports that he drinks alcohol. He reports that he does not use illicit drugs.  ALLERGIES:  Allergies  Allergen Reactions  . Hydrochlorothiazide Rash and Palpitations  . Asa [Aspirin] Nausea Only  . Oxycodone Other (See Comments)    Patient does not want to take. Severe Hallucinations.  . Amoxicillin Itching and Rash  . Indapamide Palpitations    Lightheadedness, dizziness    ROS: A comprehensive review of systems was negative except for: Constitutional: positive for fatigue and Weakness  HOME MEDS: Current Outpatient Prescriptions  Medication Sig Dispense Refill  . folic acid (FOLVITE) 1 MG tablet Take 1 mg by mouth daily.    [name] loperamide (IMODIUM) 2 MG capsule Take 2 mg by mouth as needed.    . potassium chloride (MICRO-K) 10 MEQ CR capsule Take 20 mEq by mouth daily.    . prochlorperazine (COMPAZINE) 10 MG tablet Take 10 mg by mouth daily.    [name] acyclovir (ZOVIRAX) 400 MG tablet Take 1 tablet (400 mg total) by mouth 2 (two) times daily. 180 tablet 1  . aspirin 81 MG chewable tablet Chew 1 tablet (81 mg total) by mouth daily.    . bisacodyl (STIMULANT LAXATIVE) 5 MG EC tablet Take 5 mg by mouth as needed.    . calcium carbonate (OS-CAL) 600 MG TABS tablet Take 1,200 mg by  mouth daily with breakfast.    . clopidogrel (PLAVIX) 75 MG tablet Take 1 tablet (75 mg total) by mouth daily. 90 tablet 3  . cyclobenzaprine (FLEXERIL) 5 MG tablet Take 1 tablet (5 mg total) by mouth 3 (three) times daily. 90 tablet 1  . ezetimibe-simvastatin (VYTORIN) 10-40 MG per tablet Take 1 tablet by mouth at bedtime.    . metoprolol succinate (TOPROL-XL) 25 MG 24 hr tablet Take 0.5 tablets (12.5 mg total) by mouth daily. 45 tablet 3  . Multiple Vitamins-Minerals (CENTRUM SILVER ULTRA MENS PO) Take 1 tablet by mouth daily.    . nitroGLYCERIN (NITROSTAT) 0.4 MG SL tablet Place 0.4 mg under the tongue every 5 (five) minutes as needed for chest pain.    [name] ondansetron (ZOFRAN) 8 MG tablet Take 1 tablet (8 mg total) by mouth 2 (two) times daily as needed for nausea or vomiting. 60 tablet 1  . polyethylene glycol (MIRALAX /  GLYCOLAX) packet Take 17 g by mouth 2 (two) times daily.    . potassium chloride SA (K-DUR,KLOR-CON) 20 MEQ tablet Take 3 tablets (60 mEq total) by mouth daily. 270 tablet 1  . ranitidine (ZANTAC) 150 MG tablet Take 2 tablets (300 mg total) by mouth at bedtime. 60 tablet 1   No current facility-administered medications for this visit.    LABS/IMAGING: Results for orders placed or performed in visit on 09/09/15 (from the past 48 hour(s))  CBC & Diff and Retic     Status: Abnormal   Collection Time: 09/09/15  1:59 PM  Result Value Ref Range   WBC 4.6 4.0 - 10.3 10e3/uL   NEUT# 2.8 1.5 - 6.5 10e3/uL   HGB 10.8 (L) 13.0 - 17.1 g/dL   HCT 32.1 (L) 38.4 - 49.9 %   Platelets 77 (L) 140 - 400 10e3/uL   MCV 102.0 (H) 79.3 - 98.0 fL   MCH 34.3 (H) 27.2 - 33.4 pg   MCHC 33.7 32.0 - 36.0 g/dL   RBC 3.14 (L) 4.20 - 5.82 10e6/uL   RDW 19.2 (H) 11.0 - 14.6 %   lymph# 0.8 (L) 0.9 - 3.3 10e3/uL   MONO# 1.0 (H) 0.1 - 0.9 10e3/uL   Eosinophils Absolute 0.0 0.0 - 0.5 10e3/uL   Basophils Absolute 0.0 0.0 - 0.1 10e3/uL   NEUT% 61.1 39.0 - 75.0 %   LYMPH% 17.7 14.0 - 49.0 %   MONO%  20.7 (H) 0.0 - 14.0 %   EOS% 0.0 0.0 - 7.0 %   BASO% 0.5 0.0 - 2.0 %   Retic % 3.62 (H) 0.80 - 1.80 %   Retic Ct Abs 113.67 (H) 34.80 - 93.90 10e3/uL   Immature Retic Fract 24.50 (H) 3.00 - 10.60 %  TECHNOLOGIST REVIEW     Status: None   Collection Time: 09/09/15  1:59 PM  Result Value Ref Range   Technologist Review Few Metas, Occas Variant Lymph   Comprehensive metabolic panel (Cmet) - [facility]     Status: Abnormal   Collection Time: 09/09/15  2:00 PM  Result Value Ref Range   Sodium 140 136 - 145 mEq/L   Potassium 3.7 3.5 - 5.1 mEq/L   Chloride 113 (H) 98 - 109 mEq/L   CO2 18 (L) 22 - 29 mEq/L   Glucose 110 70 - 140 mg/dl   BUN 3.7 (L) 7.0 - 26.0 mg/dL   Creatinine 1.0 0.7 - 1.3 mg/dL   Total Bilirubin 0.63 0.20 - 1.20 mg/dL   Alkaline Phosphatase 176 (H) 40 - 150 U/L   AST 63 (H) 5 - 34 U/L   ALT 55 0 - 55 U/L   Total Protein 5.6 (L) 6.4 - 8.3 g/dL   Albumin 3.1 (L) 3.5 - 5.0 g/dL   Calcium 8.0 (L) 8.4 - 10.4 mg/dL   Anion Gap 9 3 - 11 mEq/L   EGFR 83 (L) >90 ml/min/1.73 m2    Comment: eGFR is calculated using the CKD-EPI Creatinine Equation (2009)  Magnesium     Status: None   Collection Time: 09/09/15  2:00 PM  Result Value Ref Range   Magnesium 1.6 1.5 - 2.5 mg/dl   No results found.  VITALS: BP 96/70 mmHg  Pulse 103  Ht '5\' 8"'  (1.727 m)  Wt 157 lb 12.8 oz (71.578 kg)  BMI 24.00 kg/m2  EXAM: General appearance:  Appears ill , wearing a facemask , significant hair loss, alert and no distress Neck: no adenopathy, no carotid bruit,  no JVD, supple, symmetrical, trachea midline and thyroid not enlarged, symmetric, no tenderness/mass/nodules Lungs: clear to auscultation bilaterally Heart: regular rate and rhythm, S1, S2 normal, no murmur Abdomen: soft, non-tender; bowel sounds normal; no masses,  no organomegaly Extremities: extremities normal, atraumatic, no cyanosis or edema Pulses: 2+ and symmetric Skin: Skin color, texture, turgor normal. No rashes or  lesions Neurologic: Grossly normal  EKG:  sinus tachycardia with PACs at 103  ASSESSMENT: 1. Nonischemic cardiomyopathy, EF 35-40% - improved to 65-70% by echo at [facility] on 09/04/15 2. 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 Hypertension 4. Dyslipidemia 5. History of tonsillar cancer 6. Multiple myeloma-s/p BMT 7. CKD2  PLAN: 1.   Eric Dudley  Had a recent bone marrow transplant complicated by a heart failure exacerbation, however EF has improved and normalized. He is not currently on diarrhetic's and appears euvolemic. Blood pressure is actually low today and I'm favoring discontinuing his spirometer lactone. He is on potassium supplementation will need to have this followed closely as Aldactone is a potassium sparing diuretic.  He still has a long battle ahead of him. Will plan to see him back in 3-6 months for ongoing assessment and hopefully will will have blood pressure room to increase or start additional medicines for his history of cardiomyopathy.  Pixie Casino, MD, [facility] Attending Cardiologist [place] [facility] 09/09/2015, 4:24 PM

## 2015-09-09 NOTE — Telephone Encounter (Signed)
Gave adn printed appt sched and avs for pt for sept

## 2015-09-09 NOTE — Progress Notes (Signed)
[place]  [phone] [phone]  Clinic Follow Up Note   Patient Care Team: [name], MD as PCP - General (Internal Medicine) 09/09/2015   CHIEF COMPLAINTS: Follow-up multiple myeloma    Multiple myeloma   01/31/2015 Tumor Marker SPEP showed M-SPIKE 1.7g/dl, Cr 1.5, no anemia, hypercalcemia   03/19/2015 Imaging Bone survey showed no discrete lytic lesion, but there is osteopenia and calvarial heterogeneity.   03/27/2015 Initial Diagnosis Multiple myeloma, stage I   03/27/2015 Bone Marrow Biopsy Bone Marrow, Aspirate,Biopsy: - HYPERCELLULAR BONE MARROW FOR AGE WITH PLASMA CELL NEOPLASM 47% - SEE COMMENT. PERIPHERAL BLOOD: - NO SIGNIFICANT MORPHOLOGIC ABNORMALITIES. Diagnosis Note The bone marrow shows increased number of    03/27/2015 Miscellaneous bone marrow Cytogenetics: normal. FISH panel showed the presence of +4 and +11, this is consider standard risk of MM    04/16/2015 - 04/23/2015 Hospital Admission He was admitted for worsening back pain, was found to have a T12 pathological fracture. He underwent kyphoplasty on 04/19/2015.   04/19/2015 - 08/02/2015 Chemotherapy RVD with weekly Velcade 1.3 mg/m, dexamethasone 40 mg, and Revlimid 10 mg daily on day 1-21, every 28 days, cycle 1 Revlimid was interupted by hospitalization, cycle 3 Revlimid dose increased to 25 mg daily due to his improved renal function. total 4 cyc   04/26/2015 - 05/01/2015 Hospital Admission He was admitted to [facility] for syncope episode during his radiation simulation. EKG showed ST elevation, troponin was positive, he underwent cardio catheterization which showed mild stenosis. No intervention was needed. echo showed EF 35-40%   04/29/2015 - 05/10/2015 Radiation Therapy palliative RT to T12, 20Gy in 10 fractions   08/19/2015 Bone Marrow Transplant He underwent autologous stem cell transplant at [facility].      HISTORY OF PRESENTING ILLNESS (03/15/2015):  Eric Dudley 64 y.o. 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  He has been having low back pain for 3-4 months. He had food posinin 4 month ago, and had frequent diarrhea. He started noticed low back pain since then. The pain is not radiating to leg, he also has intermittent right rib pain, worse with cough and sneezing. He remains to be physically active, able to do all the activities, such as gardening work housework without much limitation, but he doesn't sings slowly because of back pain. He takes Tylenol as needed, does not like the necrotic pain medication. No night sweats, no fever or chillss, no weight loss.   He was seen by his primary care physician Dr. [name], MD and had lab test (see below). He also had bone density scan 2/25 which showed osteoporosis, has not been treated yet.  Current therapy: Weekly Velcade 1.3 mg/m and dexamethasone 40 mg, Revlimid 10 mg daily on day 1-21, every 28 days, started on 04/19/2015, Revlimid was held in the middle of the first cycle due to multiple hospitalization and radiation.   INTERIM HISTORY: Mr. Grow returns for follow-up. He is accompanied by his son to the clinic today. He underwent autologous stem cell transplant at [facility] on 08/19/2015. Stem cell collection, last dose chemotherapy, and stem cell infusion was done as outpatient. He then was admitted to the hospital for post transplant management. His chest pain or course was uneventful, he was discharged home middle of last week.  His main complaint is fatigue and anorexia. He does not  have much appetite, drinks ensure one bottle a day, very little fluid, able to drink adequate fluids. No significant nausea. He feels moderately fatigued, able to do most of self care at home, but not much other activities. His wife was recently diagnosed with diabetes and had  a very high blood glucose level, and was quite sick, his son and his family has been helping him at home. He denies any recent fever, chills, bleeding episodes, no other symptoms.   MEDICAL HISTORY:  Past Medical History  Diagnosis Date  . Hypertension   . Dyslipidemia   . Tonsillar cancer 2006  . Cholelithiases   . Hyperlipidemia   . Chronic kidney disease     MILD,CHRONIC    SURGICAL HISTORY: Past Surgical History  Procedure Laterality Date  . Appendectomy  1962  . Shoulder surgery  1999    left  . Knee surgery  2012    right  . Cardiac catheterization N/A 04/30/2015    Procedure: Left Heart Cath and Coronary Angiography;  Surgeon: Peter M Martinique, MD;  Location: [facility] INVASIVE CV LAB CUPID;  Service: Cardiovascular;  Laterality: N/A;  . Cardiolite myocardial perfusion study  04/16/03    NEGITIVE BRUCE PROTOCAL EXERCISE STRESS TEST. EF 70%. NO ISCHEMIA.    SOCIAL HISTORY: History   Social History  . Marital Status: Married    Spouse Name: N/A  . Number of Children: 1   . Years of Education: N/A   Occupational History  . A retired Therapist, occupational    Social History Main Topics  . Smoking status: Former Smoker    Types: Cigars    Quit date: 12/29/2003  . Smokeless tobacco: Former Systems developer    Types: Chew    Quit date: 12/28/1994  . Alcohol Use: Yes     Comment: occasional beer  . Drug Use: No  . Sexual Activity: Not on file   Other Topics Concern  . Not on file   Social History Narrative  . No narrative on file    FAMILY HISTORY: Family History  Problem Relation Age of Onset  . Cancer Mother 69    cervical cancer   . Cancer Father     unknown cancer   . Hypertension Sister   . Cancer Sister     melanoma     ALLERGIES:  is allergic to hydrochlorothiazide; asa; oxycodone; amoxicillin; and indapamide.  MEDICATIONS:  Current Outpatient Prescriptions  Medication Sig Dispense Refill  . acyclovir (ZOVIRAX) 400 MG tablet Take 1 tablet (400 mg total) by mouth 2  (two) times daily. 180 tablet 1  . aspirin 81 MG chewable tablet Chew 1 tablet (81 mg total) by mouth daily.    . calcium carbonate (OS-CAL) 600 MG TABS tablet Take 1,200 mg by mouth daily with breakfast.    . clopidogrel (PLAVIX) 75 MG tablet Take 1 tablet (75 mg total) by mouth daily. 90 tablet 3  . cyclobenzaprine (FLEXERIL) 5 MG tablet Take 1 tablet (5 mg total) by mouth 3 (three) times daily. (Patient taking differently: Take 5 mg by mouth 2 (two) times daily. ) 90 tablet 1  . ezetimibe-simvastatin (VYTORIN) 10-40 MG per tablet Take 1 tablet by mouth at bedtime.    . folic acid (FOLVITE) 1 MG tablet Take 1 mg by mouth daily. Takes for 6 months.    . loperamide (IMODIUM) 2 MG capsule Take 2 mg by mouth 4 (four) times daily as needed.     . metoprolol succinate (TOPROL-XL) 25 MG 24  hr tablet Take 0.5 tablets (12.5 mg total) by mouth daily. 45 tablet 3  . Multiple Vitamins-Minerals (CENTRUM SILVER ULTRA MENS PO) Take 1 tablet by mouth daily.    . ondansetron (ZOFRAN) 4 MG tablet Take 4 mg by mouth 4 (four) times daily as needed for nausea or vomiting.    . potassium chloride (MICRO-K) 10 MEQ CR capsule Take 20 mEq by mouth daily.    . prochlorperazine (COMPAZINE) 10 MG tablet Take 10 mg by mouth every 6 (six) hours as needed.     . ranitidine (ZANTAC) 150 MG tablet Take 2 tablets (300 mg total) by mouth at bedtime. 60 tablet 1  . spironolactone (ALDACTONE) 25 MG tablet Take 12.5 mg by mouth daily.    [name] sulfamethoxazole-trimethoprim (BACTRIM DS,SEPTRA DS) 800-160 MG per tablet Take 1 tablet by mouth as directed. Take 1 tablet every   Mon -  Wed  -  Fri   Begin day  30   (  9/26  Through   02/24/16 )  6  Months.     No current facility-administered medications for this visit.    REVIEW OF SYSTEMS:   Constitutional: Denies fevers, chills or abnormal night sweats Eyes: Denies blurriness of vision, double vision or watery eyes Ears, nose, mouth, throat, and face: Denies mucositis or sore  throat Respiratory: Denies cough, dyspnea or wheezes Cardiovascular: Denies palpitation, chest discomfort or lower extremity swelling Gastrointestinal:  Denies nausea, heartburn or change in bowel habits Skin: Denies abnormal skin rashes Lymphatics: Denies new lymphadenopathy or easy bruising Neurological:Denies numbness, tingling or new weaknesses Behavioral/Psych: Mood is stable, no new changes  Musculoskeletal: Positive for back pain and intermittent rib pain All other systems were reviewed with the patient and are negative.  PHYSICAL EXAMINATION: ECOG PERFORMANCE STATUS: 3  BP 104/56 mmHg  Pulse 101  Temp(Src) 97.6 F (36.4 C) (Oral)  Resp 17  Ht 5' 8.5" (1.74 m)  SpO2 100%  GENERAL: He comes in today with a wheelchair, alert and oriented, no distress, chronically ill-appearing SKIN: skin color, texture, turgor are normal, no rashes or significant lesions except to some healing rash on his hands EYES: normal, conjunctiva are pink and non-injected, sclera clear OROPHARYNX:no exudate, no erythema and lips, buccal mucosa, and tongue normal  NECK: supple, thyroid normal size, non-tender, without nodularity LYMPH:  no palpable lymphadenopathy in the cervical, axillary or inguinal LUNGS: clear to auscultation and percussion with normal breathing effort HEART: regular rate & rhythm and no murmurs and no lower extremity edema ABDOMEN:abdomen soft, non-tender and normal bowel sounds Musculoskeletal:no cyanosis of digits and no clubbing, mild tenderness on bilateral lateral chest wall  PSYCH: alert & oriented x 3 with fluent speech NEURO: no focal motor/sensory deficits  LABORATORY DATA:   CBC Latest Ref Rng 09/09/2015 08/02/2015 07/26/2015  WBC 4.0 - 10.3 10e3/uL 4.6 4.3 8.5  Hemoglobin 13.0 - 17.1 g/dL 10.8(L) 14.5 14.9  Hematocrit 38.4 - 49.9 % 32.1(L) 43.2 44.8  Platelets 140 - 400 10e3/uL 77(L) 130(L) 132(L)   CMP Latest Ref Rng 09/09/2015 07/26/2015 07/19/2015  Glucose 70 - 140  mg/dl 110 145(H) 102  BUN 7.0 - 26.0 mg/dL 3.7(L) 11.8 9.8  Creatinine 0.7 - 1.3 mg/dL 1.0 1.2 1.4(H)  Sodium 136 - 145 mEq/L 140 135(L) 135(L)  Potassium 3.5 - 5.1 mEq/L 3.7 3.9 4.2  Chloride 101 - 111 mmol/L - - -  CO2 22 - 29 mEq/L 18(L) 19(L) 17(L)  Calcium 8.4 - 10.4 mg/dL 8.0(L) 8.9 8.2(L)  Total  Protein 6.4 - 8.3 g/dL 5.6(L) 6.2(L) 6.0(L)  Total Bilirubin 0.20 - 1.20 mg/dL 0.63 0.69 0.67  Alkaline Phos 40 - 150 U/L 176(H) 191(H) 197(H)  AST 5 - 34 U/L 63(H) 22 53(H)  ALT 0 - 55 U/L 55 52 82(H)   MM lab: SPEP M-protein (g/dl) 03/15/2015: 1.6 05/24/2015: 0.5 06/21/2015: 0.4 07/18/2015 (at [facility]): 0.33  Serum IgG (mg/dl) 03/15/2015: 2000 05/24/2015: 579 06/21/2015: 673 07/18/2015 ([facility]): 509  Serum kappa/lamda/ratio 03/15/2015: 690, 0.13, 5308 05/24/2015: 146, 1.27, 115.0 06/21/2015: 86, 0.74, 116.2  24 h urine kappa light chain (mg) 03/18/2015: 2434 05/28/2015: 582 06/24/2015: 322   PATHOLOGY REPORT Bone Marrow, Aspirate,Biopsy, and Clot, right iliac 03/27/2015 - HYPERCELLULAR BONE MARROW FOR AGE WITH PLASMA CELL NEOPLASM. - SEE COMMENT. PERIPHERAL BLOOD: - NO SIGNIFICANT MORPHOLOGIC ABNORMALITIES. Diagnosis Note The bone marrow shows increased number of atypical plasma cells representing 47% of all cells in the aspirate associated with prominent interstitial infiltrates and variably sized aggregates in the clot and biopsy sections. Immunohistochemical stains show that the plasma cells are kappa light chain restricted consistent with plasma cell neoplasm. The background shows trilineage  Cytogenetics: normal FISH panel: +4 and +11  RADIOGRAPHIC STUDIES: Bone survey 03/19/2015 IMPRESSION: No discrete lytic lesion, but there is osteopenia and calvarial heterogeneity -nonspecific findings which can be manifestations of Myeloma  ASSESSMENT & PLAN:  64 year old Caucasian male with past medical history of hypertension, tonsil cancer status post surgery and radiation  10 years ago, now presented with 3-4 months low back pain and intermittent rib pain. His blood test showed M protein 1.7g/dl.  1. IgG multiple myeloma, stage I, standard risk by cytogenetics (+4 and +11) -I discussed his bone marrow biopsy results with him. He has significant amount of plasma cells in the bone marrow 47%.  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 Based on the new notable myeloma diagnostic criteria, he meets the criteria of multiple myeloma. He has normal albumin and miildly elevated beta 2 microglobulin, this is stage I. -His cytogenetics and Fish panel revealed standard risk. -His thoracic and lumbar spine MRI showed pathologic compression fracture at T12, focal myeloma deposits at L3 and T9, diffuse marrow signal abnormality consistent with myeloma -I recommended induction chemotherapy with VRD regimen, weekly Velcade and dexamethasone, first 2 cycles Revlimid dose reduced to 10 mg daily 3 week on, one-week off, based on his renal function, and it which was increased to full dose from cycle 3. He received a total of 4 cycles of treatment, achieved a very good partial response. Repeat bone marrow on 07/18/2015 showed 5% plasma cells, fish and cytogenetics was normal. -He is now status post autologous stem cell transplant, he is slowly recovering. -Lab results reviewed with patient, he has moderate thrombocytopenia, hemoglobin 10.8, no need transfusion. -He is scheduled to repeat lab on to sustain an next Monday, and a follow-up was [place] mid next week  2. T12 pathologic compression fracture, and diffuse myeloma involvement in bones -Status post kyphoplasty and palliative radiation  -He received palliative Radiation, pain much  improved  -continue pamidronate every 4 weeks, last dose 7/22, will resume when he recovers better from transplant  3. Back and rib pain, secondary to multiple myeloma bone lesions  -improved  -Continue Norco and tylenol as needed  4 nonobstructive CAD, STEMI, Non ischemic CM, Takatsubo, with EF of 35-40% -Patient was initially found to have troponin elevation and anterior apical ST elevation MI and resultant cardiomyopathy on 04/26/15 -He underwent a cardiac catheterization on 5/3 which showed mild diffuse disease, no stents were placed. He is on aspirin -follow up with Dr. Percival Spanish -Repeat echo before transplant showed EF 55-60%  5. Fatigue and anorexia -Secondary to high-dose chemotherapy before transplant -I encouraged him to take more nutrition supplement, such as Ensure or boost -If his appetite still an issue in 2-3 weeks, I'll consider appetite stimulator  Follow up: -His scheduled follow-up this Thursday and next Monday, we'll consider blood transfusion as per the criteria by [facility] transplant team -He is scheduled to see see Dr. Debbrah Alar at [facility] next week -I'll see him back in 2 weeks for follow-up.   All questions were answered. The patient knows to call the clinic with any problems, questions or concerns.  I spent 25 minutes counseling the patient face to face. The total time spent in the appointment was 30 minutes and more than 50% was on counseling.     Truitt Merle, MD 09/09/2015

## 2015-09-10 ENCOUNTER — Telehealth: Payer: Self-pay | Admitting: *Deleted

## 2015-09-10 NOTE — Telephone Encounter (Signed)
Forwarded call to collaborative nurse from [place] with [facility].  "Referral has ben received and in their system."

## 2015-09-10 NOTE — Telephone Encounter (Signed)
Faxed last office notes from 09/09/15 to [facility]  @ [facility] for home health referral. [facility]      Phone    [phone]     Or     [phone]     ;      Fax        [phone].

## 2015-09-12 ENCOUNTER — Other Ambulatory Visit (HOSPITAL_BASED_OUTPATIENT_CLINIC_OR_DEPARTMENT_OTHER): Payer: Medicare Other

## 2015-09-12 DIAGNOSIS — C9000 Multiple myeloma not having achieved remission: Secondary | ICD-10-CM | POA: Diagnosis present

## 2015-09-12 LAB — COMPREHENSIVE METABOLIC PANEL (CC13)
ALT: 42 U/L (ref 0–55)
ANION GAP: 8 meq/L (ref 3–11)
AST: 37 U/L — AB (ref 5–34)
Albumin: 3.2 g/dL — ABNORMAL LOW (ref 3.5–5.0)
Alkaline Phosphatase: 172 U/L — ABNORMAL HIGH (ref 40–150)
BILIRUBIN TOTAL: 0.72 mg/dL (ref 0.20–1.20)
BUN: 4.3 mg/dL — AB (ref 7.0–26.0)
CALCIUM: 8.8 mg/dL (ref 8.4–10.4)
CHLORIDE: 110 meq/L — AB (ref 98–109)
CO2: 19 meq/L — AB (ref 22–29)
CREATININE: 1 mg/dL (ref 0.7–1.3)
EGFR: 76 mL/min/{1.73_m2} — ABNORMAL LOW (ref >90)
Glucose: 114 mg/dl (ref 70–140)
Potassium: 4.1 mEq/L (ref 3.5–5.1)
Sodium: 137 mEq/L (ref 136–145)
TOTAL PROTEIN: 5.7 g/dL — AB (ref 6.4–8.3)

## 2015-09-12 LAB — CBC WITH DIFFERENTIAL/PLATELET
BASO%: 0.2 % (ref 0.0–2.0)
Basophils Absolute: 0 10*3/uL (ref 0.0–0.1)
EOS%: 0.5 % (ref 0.0–7.0)
Eosinophils Absolute: 0 10*3/uL (ref 0.0–0.5)
HEMATOCRIT: 34.3 % — AB (ref 38.4–49.9)
HGB: 11.6 g/dL — ABNORMAL LOW (ref 13.0–17.1)
LYMPH#: 1.2 10*3/uL (ref 0.9–3.3)
LYMPH%: 19.3 % (ref 14.0–49.0)
MCH: 33.8 pg — ABNORMAL HIGH (ref 27.2–33.4)
MCHC: 33.8 g/dL (ref 32.0–36.0)
MCV: 100 fL — ABNORMAL HIGH (ref 79.3–98.0)
MONO#: 1.3 10*3/uL — AB (ref 0.1–0.9)
MONO%: 20.6 % — ABNORMAL HIGH (ref 0.0–14.0)
NEUT%: 59.4 % (ref 39.0–75.0)
NEUTROS ABS: 3.8 10*3/uL (ref 1.5–6.5)
PLATELETS: 103 10*3/uL — AB (ref 140–400)
RBC: 3.43 10*6/uL — ABNORMAL LOW (ref 4.20–5.82)
RDW: 18.7 % — AB (ref 11.0–14.6)
WBC: 6.4 10*3/uL (ref 4.0–10.3)

## 2015-09-12 LAB — MAGNESIUM (CC13): MAGNESIUM: 1.7 mg/dL (ref 1.5–2.5)

## 2015-09-13 ENCOUNTER — Encounter: Payer: Self-pay | Admitting: Internal Medicine

## 2015-09-16 ENCOUNTER — Other Ambulatory Visit (HOSPITAL_BASED_OUTPATIENT_CLINIC_OR_DEPARTMENT_OTHER): Payer: Medicare Other

## 2015-09-16 ENCOUNTER — Telehealth: Payer: Self-pay | Admitting: *Deleted

## 2015-09-16 DIAGNOSIS — C9000 Multiple myeloma not having achieved remission: Secondary | ICD-10-CM | POA: Diagnosis present

## 2015-09-16 LAB — COMPREHENSIVE METABOLIC PANEL (CC13)
ALBUMIN: 3.4 g/dL — AB (ref 3.5–5.0)
ALK PHOS: 160 U/L — AB (ref 40–150)
ALT: 32 U/L (ref 0–55)
ANION GAP: 9 meq/L (ref 3–11)
AST: 28 U/L (ref 5–34)
BUN: 7.5 mg/dL (ref 7.0–26.0)
CALCIUM: 9.3 mg/dL (ref 8.4–10.4)
CHLORIDE: 104 meq/L (ref 98–109)
CO2: 22 mEq/L (ref 22–29)
Creatinine: 1.1 mg/dL (ref 0.7–1.3)
EGFR: 74 mL/min/{1.73_m2} — AB (ref >90)
Glucose: 121 mg/dl (ref 70–140)
POTASSIUM: 3.6 meq/L (ref 3.5–5.1)
Sodium: 136 mEq/L (ref 136–145)
Total Bilirubin: 0.79 mg/dL (ref 0.20–1.20)
Total Protein: 5.8 g/dL — ABNORMAL LOW (ref 6.4–8.3)

## 2015-09-16 LAB — TECHNOLOGIST REVIEW

## 2015-09-16 LAB — CBC WITH DIFFERENTIAL/PLATELET
BASO%: 0.3 % (ref 0.0–2.0)
Basophils Absolute: 0 10*3/uL (ref 0.0–0.1)
EOS%: 1.4 % (ref 0.0–7.0)
Eosinophils Absolute: 0.1 10*3/uL (ref 0.0–0.5)
HEMATOCRIT: 38.3 % — AB (ref 38.4–49.9)
HEMOGLOBIN: 13 g/dL (ref 13.0–17.1)
LYMPH#: 1 10*3/uL (ref 0.9–3.3)
LYMPH%: 16.2 % (ref 14.0–49.0)
MCH: 35 pg — ABNORMAL HIGH (ref 27.2–33.4)
MCHC: 33.9 g/dL (ref 32.0–36.0)
MCV: 103.2 fL — ABNORMAL HIGH (ref 79.3–98.0)
MONO#: 1.5 10*3/uL — AB (ref 0.1–0.9)
MONO%: 23.8 % — ABNORMAL HIGH (ref 0.0–14.0)
NEUT%: 58.3 % (ref 39.0–75.0)
NEUTROS ABS: 3.8 10*3/uL (ref 1.5–6.5)
Platelets: 130 10*3/uL — ABNORMAL LOW (ref 140–400)
RBC: 3.71 10*6/uL — ABNORMAL LOW (ref 4.20–5.82)
RDW: 18.2 % — ABNORMAL HIGH (ref 11.0–14.6)
WBC: 6.4 10*3/uL (ref 4.0–10.3)
nRBC: 0 % (ref 0–0)

## 2015-09-16 LAB — MAGNESIUM (CC13): Magnesium: 2 mg/dl (ref 1.5–2.5)

## 2015-09-16 NOTE — Telephone Encounter (Signed)
Faxed lab results done today to Barbette Reichmann, RN at [facility]. Fax    [phone]   ;     Phone     [phone].

## 2015-09-18 DIAGNOSIS — R110 Nausea: Secondary | ICD-10-CM | POA: Diagnosis not present

## 2015-09-18 DIAGNOSIS — C9000 Multiple myeloma not having achieved remission: Secondary | ICD-10-CM | POA: Diagnosis not present

## 2015-09-18 DIAGNOSIS — Z9484 Stem cells transplant status: Secondary | ICD-10-CM | POA: Diagnosis not present

## 2015-09-24 ENCOUNTER — Encounter: Payer: Self-pay | Admitting: Hematology

## 2015-09-24 ENCOUNTER — Ambulatory Visit (HOSPITAL_BASED_OUTPATIENT_CLINIC_OR_DEPARTMENT_OTHER): Payer: Medicare Other

## 2015-09-24 ENCOUNTER — Telehealth: Payer: Self-pay | Admitting: *Deleted

## 2015-09-24 ENCOUNTER — Telehealth: Payer: Self-pay | Admitting: Hematology

## 2015-09-24 ENCOUNTER — Ambulatory Visit (HOSPITAL_BASED_OUTPATIENT_CLINIC_OR_DEPARTMENT_OTHER): Payer: Medicare Other | Admitting: Hematology

## 2015-09-24 VITALS — BP 110/72 | HR 106 | Temp 98.6°F | Resp 18 | Ht 68.0 in | Wt 161.1 lb

## 2015-09-24 DIAGNOSIS — Z23 Encounter for immunization: Secondary | ICD-10-CM | POA: Diagnosis present

## 2015-09-24 DIAGNOSIS — C9000 Multiple myeloma not having achieved remission: Secondary | ICD-10-CM | POA: Diagnosis not present

## 2015-09-24 LAB — COMPREHENSIVE METABOLIC PANEL (CC13)
ALT: 25 U/L (ref 0–55)
ANION GAP: 10 meq/L (ref 3–11)
AST: 26 U/L (ref 5–34)
Albumin: 3.1 g/dL — ABNORMAL LOW (ref 3.5–5.0)
Alkaline Phosphatase: 162 U/L — ABNORMAL HIGH (ref 40–150)
BUN: 9.1 mg/dL (ref 7.0–26.0)
CHLORIDE: 105 meq/L (ref 98–109)
CO2: 25 meq/L (ref 22–29)
Calcium: 8.8 mg/dL (ref 8.4–10.4)
Creatinine: 1.2 mg/dL (ref 0.7–1.3)
EGFR: 63 mL/min/{1.73_m2} — AB (ref >90)
GLUCOSE: 102 mg/dL (ref 70–140)
POTASSIUM: 3.2 meq/L — AB (ref 3.5–5.1)
Sodium: 139 mEq/L (ref 136–145)
TOTAL PROTEIN: 5.2 g/dL — AB (ref 6.4–8.3)
Total Bilirubin: 0.58 mg/dL (ref 0.20–1.20)

## 2015-09-24 LAB — CBC WITH DIFFERENTIAL/PLATELET
BASO%: 0.8 % (ref 0.0–2.0)
BASOS ABS: 0.1 10*3/uL (ref 0.0–0.1)
EOS%: 4.1 % (ref 0.0–7.0)
Eosinophils Absolute: 0.4 10*3/uL (ref 0.0–0.5)
HEMATOCRIT: 40 % (ref 38.4–49.9)
HEMOGLOBIN: 13.1 g/dL (ref 13.0–17.1)
LYMPH#: 0.7 10*3/uL — AB (ref 0.9–3.3)
LYMPH%: 7.6 % — ABNORMAL LOW (ref 14.0–49.0)
MCH: 35.3 pg — ABNORMAL HIGH (ref 27.2–33.4)
MCHC: 32.6 g/dL (ref 32.0–36.0)
MCV: 108.1 fL — ABNORMAL HIGH (ref 79.3–98.0)
MONO#: 1.3 10*3/uL — AB (ref 0.1–0.9)
MONO%: 14.6 % — ABNORMAL HIGH (ref 0.0–14.0)
NEUT#: 6.5 10*3/uL (ref 1.5–6.5)
NEUT%: 72.9 % (ref 39.0–75.0)
Platelets: 141 10*3/uL (ref 140–400)
RBC: 3.7 10*6/uL — ABNORMAL LOW (ref 4.20–5.82)
RDW: 17.5 % — AB (ref 11.0–14.6)
WBC: 8.9 10*3/uL (ref 4.0–10.3)

## 2015-09-24 MED ORDER — SODIUM CHLORIDE 0.9 % IV SOLN
1000.0000 mL | Freq: Once | INTRAVENOUS | Status: AC
  Administered 2015-09-24: 1000 mL via INTRAVENOUS

## 2015-09-24 MED ORDER — INFLUENZA VAC SPLIT QUAD 0.5 ML IM SUSY
0.5000 mL | PREFILLED_SYRINGE | Freq: Once | INTRAMUSCULAR | Status: AC
  Administered 2015-09-24: 0.5 mL via INTRAMUSCULAR
  Filled 2015-09-24: qty 0.5

## 2015-09-24 NOTE — Telephone Encounter (Signed)
Per staff message and POF I have scheduled appts. Advised scheduler of appts and first available given. JMW  

## 2015-09-24 NOTE — Patient Instructions (Signed)
Dehydration, Adult Dehydration is when you lose more fluids from the body than you take in. Vital organs like the kidneys, brain, and heart cannot function without a proper amount of fluids and salt. Any loss of fluids from the body can cause dehydration.  CAUSES   Vomiting.  Diarrhea.  Excessive sweating.  Excessive urine output.  Fever. SYMPTOMS  Mild dehydration  Thirst.  Dry lips.  Slightly dry mouth. Moderate dehydration  Very dry mouth.  Sunken eyes.  Skin does not bounce back quickly when lightly pinched and released.  Dark urine and decreased urine production.  Decreased tear production.  Headache. Severe dehydration  Very dry mouth.  Extreme thirst.  Rapid, weak pulse (more than 100 beats per minute at rest).  Cold hands and feet.  Not able to sweat in spite of heat and temperature.  Rapid breathing.  Blue lips.  Confusion and lethargy.  Difficulty being awakened.  Minimal urine production.  No tears. DIAGNOSIS  Your caregiver will diagnose dehydration based on your symptoms and your exam. Blood and urine tests will help confirm the diagnosis. The diagnostic evaluation should also identify the cause of dehydration. TREATMENT  Treatment of mild or moderate dehydration can often be done at home by increasing the amount of fluids that you drink. It is best to drink small amounts of fluid more often. Drinking too much at one time can make vomiting worse. Refer to the home care instructions below. Severe dehydration needs to be treated at the hospital where you will probably be given intravenous (IV) fluids that contain water and electrolytes. HOME CARE INSTRUCTIONS   Ask your caregiver about specific rehydration instructions.  Drink enough fluids to keep your urine clear or pale yellow.  Drink small amounts frequently if you have nausea and vomiting.  Eat as you normally do.  Avoid:  Foods or drinks high in sugar.  Carbonated  drinks.  Juice.  Extremely hot or cold fluids.  Drinks with caffeine.  Fatty, greasy foods.  Alcohol.  Tobacco.  Overeating.  Gelatin desserts.  Wash your hands well to avoid spreading bacteria and viruses.  Only take over-the-counter or prescription medicines for pain, discomfort, or fever as directed by your caregiver.  Ask your caregiver if you should continue all prescribed and over-the-counter medicines.  Keep all follow-up appointments with your caregiver. SEEK MEDICAL CARE IF:  You have abdominal pain and it increases or stays in one area (localizes).  You have a rash, stiff neck, or severe headache.  You are irritable, sleepy, or difficult to awaken.  You are weak, dizzy, or extremely thirsty. SEEK IMMEDIATE MEDICAL CARE IF:   You are unable to keep fluids down or you get worse despite treatment.  You have frequent episodes of vomiting or diarrhea.  You have blood or green matter (bile) in your vomit.  You have blood in your stool or your stool looks black and tarry.  You have not urinated in 6 to 8 hours, or you have only urinated a small amount of very dark urine.  You have a fever.  You faint. MAKE SURE YOU:   Understand these instructions.  Will watch your condition.  Will get help right away if you are not doing well or get worse. Document Released: 12/14/2005 Document Revised: 03/07/2012 Document Reviewed: 08/03/2011 ExitCare Patient Information 2015 [employer]. This information is not intended to replace advice given to you by your health care provider. Make sure you discuss any questions you have with your health care   provider.  

## 2015-09-24 NOTE — Telephone Encounter (Signed)
per pof to sch pt appt-gave pt copy of avs °

## 2015-09-24 NOTE — Progress Notes (Signed)
[place]  [phone]) [phone] Fax:([phone]) [phone]  Clinic Follow Up Note   Patient Care Team: Eric Seashore, MD as PCP - General (Internal Medicine) 09/24/2015   CHIEF COMPLAINTS: Follow-up multiple myeloma    Multiple myeloma   01/31/2015 Tumor Marker SPEP showed M-SPIKE 1.7g/dl, Cr 1.5, no anemia, hypercalcemia   03/19/2015 Imaging Bone survey showed no discrete lytic lesion, but there is osteopenia and calvarial heterogeneity.   03/27/2015 Initial Diagnosis Multiple myeloma, stage I   03/27/2015 Bone Marrow Biopsy Bone Marrow, Aspirate,Biopsy: - HYPERCELLULAR BONE MARROW FOR AGE WITH PLASMA CELL NEOPLASM 47% - SEE COMMENT. PERIPHERAL BLOOD: - NO SIGNIFICANT MORPHOLOGIC ABNORMALITIES. Diagnosis Note The bone marrow shows increased number of    03/27/2015 Miscellaneous bone marrow Cytogenetics: normal. FISH panel showed the presence of +4 and +11, this is consider standard risk of MM    04/16/2015 - 04/23/2015 Hospital Admission He was admitted for worsening back pain, was found to have a T12 pathological fracture. He underwent kyphoplasty on 04/19/2015.   04/19/2015 - 08/02/2015 Chemotherapy RVD with weekly Velcade 1.3 mg/m, dexamethasone 40 mg, and Revlimid 10 mg daily on day 1-21, every 28 days, cycle 1 Revlimid was interupted by hospitalization, cycle 3 Revlimid dose increased to 25 mg daily due to his improved renal function. total 4 cyc   04/26/2015 - 05/01/2015 Hospital Admission He was admitted to [facility] for syncope episode during his radiation simulation. EKG showed ST elevation, troponin was positive, he underwent cardio catheterization which showed mild stenosis. No intervention was needed. echo showed EF 35-40%   04/29/2015 - 05/10/2015 Radiation Therapy palliative RT to T12, 20Gy in 10 fractions   08/19/2015 Bone Marrow Transplant He underwent autologous stem cell transplant at [facility].      HISTORY OF PRESENTING ILLNESS (03/15/2015):  Eric Dudley 64 y.o. 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  He has been having low back pain for 3-4 months. He had food posinin 4 month ago, and had frequent diarrhea. He started noticed low back pain since then. The pain is not radiating to leg, he also has intermittent right rib pain, worse with cough and sneezing. He remains to be physically active, able to do all the activities, such as gardening work housework without much limitation, but he doesn't sings slowly because of back pain. He takes Tylenol as needed, does not like the necrotic pain medication. No night sweats, no fever or chillss, no weight loss.   He was seen by his primary care physician Dr. [name], MD and had lab test (see below). He also had bone density scan 2/25 which showed osteoporosis, has not been treated yet.  Current therapy: Weekly Velcade 1.3 mg/m and dexamethasone 40 mg, Revlimid 10 mg daily on day 1-21, every 28 days, started on 04/19/2015, Revlimid was held in the middle of the first cycle due to multiple hospitalization and radiation.   INTERIM HISTORY: Eric Dudley returns for follow-up. He is accompanied by his son to the clinic today. He went back to [facility] for 30 days post transplant follow-up last week. He also states he saw his cardiologist at [facility] and repeat echo was good, per patient. He still quite fatigued, not able to tolerate much activity. He complains of dizziness, especially when he stands up. He actually passed out twice at home in the past week. His appetite remains to  be quite low. He pushes himself to eat and drink. His weight is stable. He lost about 20 pounds since the transplant, did again 4 pounds back in the last 2 weeks. No fever or chills.  MEDICAL HISTORY:  Past Medical History  Diagnosis Date  . Hypertension   . Dyslipidemia   . Tonsillar cancer 2006  .  Cholelithiases   . Hyperlipidemia   . Chronic kidney disease     MILD,CHRONIC    SURGICAL HISTORY: Past Surgical History  Procedure Laterality Date  . Appendectomy  1962  . Shoulder surgery  1999    left  . Knee surgery  2012    right  . Cardiac catheterization N/A 04/30/2015    Procedure: Left Heart Cath and Coronary Angiography;  Surgeon: Peter M Martinique, MD;  Location: [facility] INVASIVE CV LAB CUPID;  Service: Cardiovascular;  Laterality: N/A;  . Cardiolite myocardial perfusion study  04/16/03    NEGITIVE BRUCE PROTOCAL EXERCISE STRESS TEST. EF 70%. NO ISCHEMIA.    SOCIAL HISTORY: History   Social History  . Marital Status: Married    Spouse Name: N/A  . Number of Children: 1   . Years of Education: N/A   Occupational History  . A retired Therapist, occupational    Social History Main Topics  . Smoking status: Former Smoker    Types: Cigars    Quit date: 12/29/2003  . Smokeless tobacco: Former Systems developer    Types: Chew    Quit date: 12/28/1994  . Alcohol Use: Yes     Comment: occasional beer  . Drug Use: No  . Sexual Activity: Not on file   Other Topics Concern  . Not on file   Social History Narrative  . No narrative on file    FAMILY HISTORY: Family History  Problem Relation Age of Onset  . Cancer Mother 55    cervical cancer   . Cancer Father     unknown cancer   . Hypertension Sister   . Cancer Sister     melanoma     ALLERGIES:  is allergic to hydrochlorothiazide; asa; oxycodone; amoxicillin; and indapamide.  MEDICATIONS:  Current Outpatient Prescriptions  Medication Sig Dispense Refill  . acyclovir (ZOVIRAX) 400 MG tablet Take 1 tablet (400 mg total) by mouth 2 (two) times daily. 180 tablet 1  . aspirin 81 MG chewable tablet Chew 1 tablet (81 mg total) by mouth daily.    . calcium carbonate (OS-CAL) 600 MG TABS tablet Take 1,200 mg by mouth daily with breakfast.    . clopidogrel (PLAVIX) 75 MG tablet Take 1 tablet (75 mg total) by mouth daily. 90 tablet 3  .  cyclobenzaprine (FLEXERIL) 5 MG tablet Take 1 tablet (5 mg total) by mouth 3 (three) times daily. (Patient taking differently: Take 5 mg by mouth 2 (two) times daily. ) 90 tablet 1  . ezetimibe-simvastatin (VYTORIN) 10-40 MG per tablet Take 1 tablet by mouth at bedtime.    . folic acid (FOLVITE) 1 MG tablet Take 1 mg by mouth daily. Takes for 6 months.    . metoprolol succinate (TOPROL-XL) 25 MG 24 hr tablet Take 0.5 tablets (12.5 mg total) by mouth daily. 45 tablet 3  . Multiple Vitamins-Minerals (CENTRUM SILVER ULTRA MENS PO) Take 1 tablet by mouth daily.    . ondansetron (ZOFRAN) 4 MG tablet Take 4 mg by mouth 4 (four) times daily as needed for nausea or vomiting.    . potassium chloride (MICRO-K) 10 MEQ CR capsule Take  20 mEq by mouth daily.    . prochlorperazine (COMPAZINE) 10 MG tablet Take 10 mg by mouth every 6 (six) hours as needed.     . ranitidine (ZANTAC) 150 MG tablet Take 2 tablets (300 mg total) by mouth at bedtime. 60 tablet 1  . spironolactone (ALDACTONE) 25 MG tablet Take 12.5 mg by mouth daily.    [name] sulfamethoxazole-trimethoprim (BACTRIM DS,SEPTRA DS) 800-160 MG per tablet Take 1 tablet by mouth as directed. Take 1 tablet every   Mon -  Wed  -  Fri   Begin day  30   (  9/26  Through   02/24/16 )  6  Months.     No current facility-administered medications for this visit.    REVIEW OF SYSTEMS:   Constitutional: Denies fevers, chills or abnormal night sweats Eyes: Denies blurriness of vision, double vision or watery eyes Ears, nose, mouth, throat, and face: Denies mucositis or sore throat Respiratory: Denies cough, dyspnea or wheezes Cardiovascular: Denies palpitation, chest discomfort or lower extremity swelling Gastrointestinal:  Denies nausea, heartburn or change in bowel habits Skin: Denies abnormal skin rashes Lymphatics: Denies new lymphadenopathy or easy bruising Neurological:Denies numbness, tingling or new weaknesses Behavioral/Psych: Mood is stable, no new changes    Musculoskeletal: Positive for back pain and intermittent rib pain All other systems were reviewed with the patient and are negative.  PHYSICAL EXAMINATION: ECOG PERFORMANCE STATUS: 3  BP 110/72 mmHg  Pulse 106  Temp(Src) 98.6 F (37 C) (Oral)  Resp 18  Ht _0  (1.727 m)  Wt 161 lb 1.6 oz (73.074 kg)  BMI 24.50 kg/m2  SpO2 100%  GENERAL: He comes in today with a wheelchair, alert and oriented, no distress, chronically ill-appearing SKIN: skin color, texture, turgor are normal, no rashes or significant lesions except to some healing rash on his hands EYES: normal, conjunctiva are pink and non-injected, sclera clear OROPHARYNX:no exudate, no erythema and lips, buccal mucosa, and tongue normal  NECK: supple, thyroid normal size, non-tender, without nodularity LYMPH:  no palpable lymphadenopathy in the cervical, axillary or inguinal LUNGS: clear to auscultation and percussion with normal breathing effort HEART: regular rate & rhythm and no murmurs and no lower extremity edema ABDOMEN:abdomen soft, non-tender and normal bowel sounds Musculoskeletal:no cyanosis of digits and no clubbing, mild tenderness on bilateral lateral chest wall  PSYCH: alert & oriented x 3 with fluent speech NEURO: no focal motor/sensory deficits  LABORATORY DATA:   CBC Latest Ref Rng 09/24/2015 09/16/2015 09/12/2015  WBC 4.0 - 10.3 10e3/uL 8.9 6.4 6.4  Hemoglobin 13.0 - 17.1 g/dL 13.1 13.0 11.6(L)  Hematocrit 38.4 - 49.9 % 40.0 38.3(L) 34.3(L)  Platelets 140 - 400 10e3/uL 141 130 with few platelet clumps(L) 103(L)   CMP Latest Ref Rng 09/24/2015 09/16/2015 09/12/2015  Glucose 70 - 140 mg/dl 102 121 114  BUN 7.0 - 26.0 mg/dL 9.1 7.5 4.3(L)  Creatinine 0.7 - 1.3 mg/dL 1.2 1.1 1.0  Sodium 136 - 145 mEq/L 139 136 137  Potassium 3.5 - 5.1 mEq/L 3.2(L) 3.6 4.1  Chloride 101 - 111 mmol/L - - -  CO2 22 - 29 mEq/L 25 22 19(L)  Calcium 8.4 - 10.4 mg/dL 8.8 9.3 8.8  Total Protein 6.4 - 8.3 g/dL 5.2(L) 5.8(L) 5.7(L)   Total Bilirubin 0.20 - 1.20 mg/dL 0.58 0.79 0.72  Alkaline Phos 40 - 150 U/L 162(H) 160(H) 172(H)  AST 5 - 34 U/L 26 28 37(H)  ALT 0 - 55 U/L 25 32 42  MM lab: SPEP M-protein (g/dl) 03/15/2015: 1.6 05/24/2015: 0.5 06/21/2015: 0.4 07/18/2015 (at [facility]): 0.33  Serum IgG (mg/dl) 03/15/2015: 2000 05/24/2015: 579 06/21/2015: 673 07/18/2015 ([facility]): 509  Serum kappa/lamda/ratio 03/15/2015: 690, 0.13, 5308 05/24/2015: 146, 1.27, 115.0 06/21/2015: 86, 0.74, 116.2  24 h urine kappa light chain (mg) 03/18/2015: 2434 05/28/2015: 582 06/24/2015: 322   PATHOLOGY REPORT Bone Marrow, Aspirate,Biopsy, and Clot, right iliac 03/27/2015 - HYPERCELLULAR BONE MARROW FOR AGE WITH PLASMA CELL NEOPLASM. - SEE COMMENT. PERIPHERAL BLOOD: - NO SIGNIFICANT MORPHOLOGIC ABNORMALITIES. Diagnosis Note The bone marrow shows increased number of atypical plasma cells representing 47% of all cells in the aspirate associated with prominent interstitial infiltrates and variably sized aggregates in the clot and biopsy sections. Immunohistochemical stains show that the plasma cells are kappa light chain restricted consistent with plasma cell neoplasm. The background shows trilineage  Cytogenetics: normal FISH panel: +4 and +11  RADIOGRAPHIC STUDIES: Bone survey 03/19/2015 IMPRESSION: No discrete lytic lesion, but there is osteopenia and calvarial heterogeneity -nonspecific findings which can be manifestations of Myeloma  ASSESSMENT & PLAN:  64 year old Caucasian male with past medical history of hypertension, tonsil cancer status post surgery and radiation 10 years ago, now presented with 3-4 months low back pain and intermittent rib pain. His blood test showed M protein 1.7g/dl.  1. IgG multiple myeloma, stage I, standard risk by cytogenetics (+4 and +11) -I discussed his bone marrow biopsy results with him. He has significant amount of plasma cells in the bone marrow 47%.  -His SPEP showed monoclonal  globulin anemia with M spike 1.7 g/dl, significantly increased IgG level and carboplatin chain, kappa and lambda light chain ratio more than 100, he also has mild renal failure with creatinine 1.5-1.6, back pain and intermittent rib pain, bone survey showed osteopenia. Based on the new notable myeloma diagnostic criteria, he meets the criteria of multiple myeloma. He has normal albumin and miildly elevated beta 2 microglobulin, this is stage I. -His cytogenetics and Fish panel revealed standard risk. -His thoracic and lumbar spine MRI showed pathologic compression fracture at T12, focal myeloma deposits at L3 and T9, diffuse marrow signal abnormality consistent with myeloma -I recommended induction chemotherapy with VRD regimen, weekly Velcade and dexamethasone, first 2 cycles Revlimid dose reduced to 10 mg daily 3 week on, one-week off, based on his renal function, and it which was increased to full dose from cycle 3. He received a total of 4 cycles of treatment, achieved a very good partial response. Repeat bone marrow on 07/18/2015 showed 5% plasma cells, fish and cytogenetics was normal. -His post transplant recovery has been quite a slow, he is still very fatigued and has no appetite. -Lab reviewed, CBC has normalized, CMP is normal except for low potassium -Continue supportive care now -We briefly review Revlimid maintenance therapy after transplant. This will be determined on his 100 day post transplant visit.  2. Orthostatic hypotension -He has intermittent dizziness he stands up, and had a 2 episodes of syncope at home. -We checked his static blood pressure, his systolic blood pressure went down to 68 after he walked a minute, and he felt dizzy  -I'll give him IV fluids 1 L today. -Medications reviewed, he is only on metoprolol, not on other pressure medication. -I'll inform his cardiologist about his severe orthostatic hypotension.  3. T12 pathologic compression fracture, and diffuse  myeloma involvement in bones -Status post kyphoplasty and palliative radiation  -He received palliative Radiation, pain much improved  -continue pamidronate every 4 weeks, last dose  7/22, will resume when he recovers better from transplant  3. Back and rib pain, secondary to multiple myeloma bone lesions  -improved  -Continue Norco and tylenol as needed  4 nonobstructive CAD, STEMI, Non ischemic CM, Takatsubo, with EF of 35-40% -Patient was initially found to have troponin elevation and anterior apical ST elevation MI and resultant cardiomyopathy on 04/26/15 -He underwent a cardiac catheterization on 5/3 which showed mild diffuse disease, no stents were placed. He is on aspirin -follow up with Dr. Percival Spanish -Repeat echo before transplant showed EF 55-60%  5. Fatigue and anorexia -Secondary to high-dose chemotherapy before transplant -I encouraged him to take more nutrition supplement, such as Ensure or boost -If his appetite still an issue in 2-3 weeks, I'll consider appetite stimulator  Plan -NS 1L today -RTC in one week with ivf -I will inform his cardiologist Dr. Debara Pickett about his severe orthostatic hypotension  All questions were answered. The patient knows to call the clinic with any problems, questions or concerns.  I spent 25 minutes counseling the patient face to face. The total time spent in the appointment was 30 minutes and more than 50% was on counseling.     Truitt Merle, MD 09/24/2015

## 2015-09-30 ENCOUNTER — Encounter (HOSPITAL_COMMUNITY): Payer: Self-pay

## 2015-09-30 ENCOUNTER — Other Ambulatory Visit (HOSPITAL_BASED_OUTPATIENT_CLINIC_OR_DEPARTMENT_OTHER): Payer: Medicare Other

## 2015-09-30 ENCOUNTER — Ambulatory Visit (HOSPITAL_COMMUNITY)
Admission: RE | Admit: 2015-09-30 | Discharge: 2015-09-30 | Disposition: A | Discharge status: Home / Self Care | Payer: Medicare Other | Source: Ambulatory Visit | Attending: Hematology | Admitting: Hematology

## 2015-09-30 ENCOUNTER — Telehealth: Payer: Self-pay | Admitting: Hematology

## 2015-09-30 ENCOUNTER — Ambulatory Visit: Payer: Medicare Other

## 2015-09-30 ENCOUNTER — Telehealth: Payer: Self-pay | Admitting: Internal Medicine

## 2015-09-30 ENCOUNTER — Other Ambulatory Visit (HOSPITAL_COMMUNITY)
Admission: RE | Admit: 2015-09-30 | Discharge: 2015-09-30 | Disposition: A | Discharge status: Home / Self Care | Payer: Medicare Other | Source: Ambulatory Visit | Attending: Hematology | Admitting: Hematology

## 2015-09-30 ENCOUNTER — Ambulatory Visit (HOSPITAL_BASED_OUTPATIENT_CLINIC_OR_DEPARTMENT_OTHER): Payer: Medicare Other

## 2015-09-30 ENCOUNTER — Ambulatory Visit (HOSPITAL_BASED_OUTPATIENT_CLINIC_OR_DEPARTMENT_OTHER): Payer: Medicare Other | Admitting: Hematology

## 2015-09-30 ENCOUNTER — Other Ambulatory Visit: Payer: Self-pay

## 2015-09-30 ENCOUNTER — Encounter: Payer: Self-pay | Admitting: Hematology

## 2015-09-30 VITALS — BP 91/51 | HR 117 | Temp 98.0°F | Resp 18 | Ht 68.0 in | Wt 162.9 lb

## 2015-09-30 DIAGNOSIS — R079 Chest pain, unspecified: Secondary | ICD-10-CM | POA: Diagnosis present

## 2015-09-30 DIAGNOSIS — K8020 Calculus of gallbladder without cholecystitis without obstruction: Secondary | ICD-10-CM | POA: Diagnosis not present

## 2015-09-30 DIAGNOSIS — C9000 Multiple myeloma not having achieved remission: Secondary | ICD-10-CM | POA: Insufficient documentation

## 2015-09-30 DIAGNOSIS — Z85818 Personal history of malignant neoplasm of other sites of lip, oral cavity, and pharynx: Secondary | ICD-10-CM | POA: Insufficient documentation

## 2015-09-30 DIAGNOSIS — R937 Abnormal findings on diagnostic imaging of other parts of musculoskeletal system: Secondary | ICD-10-CM | POA: Diagnosis not present

## 2015-09-30 DIAGNOSIS — K760 Fatty (change of) liver, not elsewhere classified: Secondary | ICD-10-CM | POA: Insufficient documentation

## 2015-09-30 DIAGNOSIS — R0602 Shortness of breath: Secondary | ICD-10-CM | POA: Insufficient documentation

## 2015-09-30 LAB — COMPREHENSIVE METABOLIC PANEL (CC13)
ALT: 25 U/L (ref 0–55)
AST: 30 U/L (ref 5–34)
Albumin: 3.1 g/dL — ABNORMAL LOW (ref 3.5–5.0)
Alkaline Phosphatase: 178 U/L — ABNORMAL HIGH (ref 40–150)
Anion Gap: 9 mEq/L (ref 3–11)
BUN: 5.8 mg/dL — AB (ref 7.0–26.0)
CHLORIDE: 108 meq/L (ref 98–109)
CO2: 21 meq/L — AB (ref 22–29)
Calcium: 9 mg/dL (ref 8.4–10.4)
Creatinine: 1.2 mg/dL (ref 0.7–1.3)
EGFR: 61 mL/min/{1.73_m2} — ABNORMAL LOW (ref >90)
GLUCOSE: 103 mg/dL (ref 70–140)
POTASSIUM: 3.5 meq/L (ref 3.5–5.1)
SODIUM: 138 meq/L (ref 136–145)
Total Bilirubin: 0.42 mg/dL (ref 0.20–1.20)
Total Protein: 5.3 g/dL — ABNORMAL LOW (ref 6.4–8.3)

## 2015-09-30 LAB — CBC & DIFF AND RETIC
BASO%: 0.2 % (ref 0.0–2.0)
Basophils Absolute: 0 10*3/uL (ref 0.0–0.1)
EOS%: 3.8 % (ref 0.0–7.0)
Eosinophils Absolute: 0.3 10*3/uL (ref 0.0–0.5)
HCT: 39.3 % (ref 38.4–49.9)
HGB: 12.9 g/dL — ABNORMAL LOW (ref 13.0–17.1)
Immature Retic Fract: 14.2 % — ABNORMAL HIGH (ref 3.00–10.60)
LYMPH#: 1 10*3/uL (ref 0.9–3.3)
LYMPH%: 11.4 % — ABNORMAL LOW (ref 14.0–49.0)
MCH: 35.8 pg — AB (ref 27.2–33.4)
MCHC: 32.8 g/dL (ref 32.0–36.0)
MCV: 109.2 fL — ABNORMAL HIGH (ref 79.3–98.0)
MONO#: 1.2 10*3/uL — ABNORMAL HIGH (ref 0.1–0.9)
MONO%: 13.6 % (ref 0.0–14.0)
NEUT%: 71 % (ref 39.0–75.0)
NEUTROS ABS: 6.4 10*3/uL (ref 1.5–6.5)
Platelets: 163 10*3/uL (ref 140–400)
RBC: 3.6 10*6/uL — AB (ref 4.20–5.82)
RDW: 16.3 % — AB (ref 11.0–14.6)
RETIC %: 3.04 % — AB (ref 0.80–1.80)
Retic Ct Abs: 109.44 10*3/uL — ABNORMAL HIGH (ref 34.80–93.90)
WBC: 9 10*3/uL (ref 4.0–10.3)

## 2015-09-30 LAB — TROPONIN I: Troponin I: 0.03 ng/mL (ref <0.031)

## 2015-09-30 MED ORDER — LIDOCAINE 5 % EX PTCH: 1.0000 | MEDICATED_PATCH | CUTANEOUS | Status: DC

## 2015-09-30 MED ORDER — IOHEXOL 350 MG/ML SOLN
100.0000 mL | Freq: Once | INTRAVENOUS | Status: AC | PRN
  Administered 2015-09-30: 72 mL via INTRAVENOUS

## 2015-09-30 MED ORDER — TRAMADOL HCL 50 MG PO TABS: 100.0000 mg | ORAL_TABLET | Freq: Four times a day (QID) | ORAL | Status: DC | PRN

## 2015-09-30 MED ORDER — SODIUM CHLORIDE 0.9 % IV SOLN
500.0000 mL | Freq: Once | INTRAVENOUS | Status: DC
Start: 2015-09-30 — End: 2015-09-30
  Administered 2015-09-30: 500 mL via INTRAVENOUS

## 2015-09-30 NOTE — Progress Notes (Signed)
[name]  [phone] [phone]  Clinic Follow Up Note   Patient Care Team: [name], MD as PCP - General (Internal Medicine) Pixie Casino, MD as Consulting Physician (Cardiology) Truitt Merle, MD as Consulting Physician (Hematology) 09/30/2015   CHIEF COMPLAINTS: Follow-up multiple myeloma    Multiple myeloma ([place])   01/31/2015 Tumor Marker SPEP showed M-SPIKE 1.7g/dl, Cr 1.5, no anemia, hypercalcemia   03/19/2015 Imaging Bone survey showed no discrete lytic lesion, but there is osteopenia and calvarial heterogeneity.   03/27/2015 Initial Diagnosis Multiple myeloma, stage I   03/27/2015 Bone Marrow Biopsy Bone Marrow, Aspirate,Biopsy: - HYPERCELLULAR BONE MARROW FOR AGE WITH PLASMA CELL NEOPLASM 47% - SEE COMMENT. PERIPHERAL BLOOD: - NO SIGNIFICANT MORPHOLOGIC ABNORMALITIES. Diagnosis Note The bone marrow shows increased number of    03/27/2015 Miscellaneous bone marrow Cytogenetics: normal. FISH panel showed the presence of +4 and +11, this is consider standard risk of MM    04/16/2015 - 04/23/2015 Hospital Admission He was admitted for worsening back pain, was found to have a T12 pathological fracture. He underwent kyphoplasty on 04/19/2015.   04/19/2015 - 08/02/2015 Chemotherapy RVD with weekly Velcade 1.3 mg/m, dexamethasone 40 mg, and Revlimid 10 mg daily on day 1-21, every 28 days, cycle 1 Revlimid was interupted by hospitalization, cycle 3 Revlimid dose increased to 25 mg daily due to his improved renal function. total 4 cyc   04/26/2015 - 05/01/2015 Hospital Admission He was admitted to [facility] for syncope episode during his radiation simulation. EKG showed ST elevation, troponin was positive, he underwent cardio catheterization which showed mild stenosis. No intervention was needed. echo showed EF 35-40%   04/29/2015 - 05/10/2015 Radiation Therapy palliative RT to T12, 20Gy in 10 fractions   08/19/2015 Bone Marrow Transplant He underwent  autologous stem cell transplant at [facility].      HISTORY OF PRESENTING ILLNESS (03/15/2015):  Eric Dudley 64 y.o. 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  He has been having low back pain for 3-4 months. He had food posinin 4 month ago, and had frequent diarrhea. He started noticed low back pain since then. The pain is not radiating to leg, he also has intermittent right rib pain, worse with cough and sneezing. He remains to be physically active, able to do all the activities, such as gardening work housework without much limitation, but he doesn't sings slowly because of back pain. He takes Tylenol as needed, does not like the necrotic pain medication. No night sweats, no fever or chillss, no weight loss.   He was seen by his primary care physician Dr. [name], MD and had lab test (see below). He also had bone density scan 2/25 which showed osteoporosis, has not been treated yet.  Current therapy: Weekly Velcade 1.3 mg/m and dexamethasone 40 mg, Revlimid 10 mg daily on day 1-21, every 28 days, started on 04/19/2015, Revlimid was held in the middle of the first cycle due to multiple hospitalization and radiation.   INTERIM HISTORY: Eric Dudley returns for follow-up. He is accompanied by his wife to the clinic today.  He does not feel well lately. His dizziness has improved since I give him IV fluids last week. His main complaint is bilateral lateral chest pain,  Especially when he changes his position. He described at 7-8 out of 10,  He cannot get comfortable and  night,  And does not sleep well.  When he moves around , his pain gets slightly better.  He did not tolerate opiates due to the hallucination. He takes Tylenol. He has dyspnea on moderate exertion,  Slightly worse this week..  No fever or chills.  His appetite seems slightly better lately, and  he drinks adequately at home. He has some mild bilateral ankle swollen.  MEDICAL HISTORY:  Past Medical History  Diagnosis Date  . Hypertension   . Dyslipidemia   . 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 Cholelithiases   . Hyperlipidemia   . Chronic kidney disease     MILD,CHRONIC    SURGICAL HISTORY: Past Surgical History  Procedure Laterality Date  . Appendectomy  1962  . Shoulder surgery  1999    left  . Knee surgery  2012    right  . Cardiac catheterization N/A 04/30/2015    Procedure: Left Heart Cath and Coronary Angiography;  Surgeon: Peter M Martinique, MD;  Location: [facility] INVASIVE CV LAB CUPID;  Service: Cardiovascular;  Laterality: N/A;  . Cardiolite myocardial perfusion study  04/16/03    NEGITIVE BRUCE PROTOCAL EXERCISE STRESS TEST. EF 70%. NO ISCHEMIA.    SOCIAL HISTORY: History   Social History  . Marital Status: Married    Spouse Name: N/A  . Number of Children: 1   . Years of Education: N/A   Occupational History  . A retired Therapist, occupational    Social History Main Topics  . Smoking status: Former Smoker    Types: Cigars    Quit date: 12/29/2003  . Smokeless tobacco: Former Systems developer    Types: Chew    Quit date: 12/28/1994  . Alcohol Use: Yes     Comment: occasional beer  . Drug Use: No  . Sexual Activity: Not on file   Other Topics Concern  . Not on file   Social History Narrative  . No narrative on file    FAMILY HISTORY: Family History  Problem Relation Age of Onset  . Cancer Mother 26    cervical cancer   . Cancer Father     unknown cancer   . Hypertension Sister   . Cancer Sister     melanoma     ALLERGIES:  is allergic to hydrochlorothiazide; asa; oxycodone; amoxicillin; and indapamide.  MEDICATIONS:  Current Outpatient Prescriptions  Medication Sig Dispense Refill  . acyclovir (ZOVIRAX) 400 MG tablet Take 1 tablet (400 mg total) by mouth 2 (two) times daily. 180 tablet 1  . aspirin 81 MG chewable tablet Chew 1 tablet (81 mg total) by mouth  daily.    . calcium carbonate (OS-CAL) 600 MG TABS tablet Take 1,200 mg by mouth daily with breakfast.    . clopidogrel (PLAVIX) 75 MG tablet Take 1 tablet (75 mg total) by mouth daily. 90 tablet 3  . cyclobenzaprine (FLEXERIL) 5 MG tablet Take 1 tablet (5 mg total) by mouth 3 (three) times daily. (Patient taking differently: Take 5 mg by mouth 2 (two) times daily. ) 90 tablet 1  . ezetimibe-simvastatin (VYTORIN) 10-40 MG per tablet Take 1 tablet by mouth at bedtime.    . folic acid (FOLVITE) 1 MG tablet Take 1 mg by mouth daily. Takes for 6 months.    . lidocaine (LIDODERM) 5 % Place 1 patch onto the skin daily. Remove & Discard patch within 12 hours or as directed by MD 30 patch 0  . metoCLOPramide (REGLAN) 10 MG tablet Take 10 mg by mouth 4 (  four) times daily.    . metoprolol succinate (TOPROL-XL) 25 MG 24 hr tablet Take 0.5 tablets (12.5 mg total) by mouth daily. 45 tablet 3  . Multiple Vitamins-Minerals (CENTRUM SILVER ULTRA MENS PO) Take 1 tablet by mouth daily.    . ondansetron (ZOFRAN) 4 MG tablet Take 4 mg by mouth 4 (four) times daily as needed for nausea or vomiting.    . potassium chloride (MICRO-K) 10 MEQ CR capsule Take 20 mEq by mouth daily.    . prochlorperazine (COMPAZINE) 10 MG tablet Take 10 mg by mouth every 6 (six) hours as needed.     . ranitidine (ZANTAC) 150 MG tablet Take 2 tablets (300 mg total) by mouth at bedtime. 60 tablet 1  . sulfamethoxazole-trimethoprim (BACTRIM DS,SEPTRA DS) 800-160 MG per tablet Take 1 tablet by mouth as directed. Take 1 tablet every   Mon -  Wed  -  Fri   Begin day  30   (  9/26  Through   02/24/16 )  6  Months.    . traMADol (ULTRAM) 50 MG tablet Take 2 tablets (100 mg total) by mouth every 6 (six) hours as needed. 60 tablet 1   Current Facility-Administered Medications  Medication Dose Route Frequency Provider Last Rate Last Dose  . 0.9 %  sodium chloride infusion  500 mL Intravenous Once Truitt Merle, MD        REVIEW OF SYSTEMS:     Constitutional: Denies fevers, chills or abnormal night sweats Eyes: Denies blurriness of vision, double vision or watery eyes Ears, nose, mouth, throat, and face: Denies mucositis or sore throat Respiratory: Denies cough, dyspnea or wheezes Cardiovascular: Denies palpitation, chest discomfort or lower extremity swelling Gastrointestinal:  Denies nausea, heartburn or change in bowel habits Skin: Denies abnormal skin rashes Lymphatics: Denies new lymphadenopathy or easy bruising Neurological:Denies numbness, tingling or new weaknesses Behavioral/Psych: Mood is stable, no new changes  Musculoskeletal: Positive for back pain and intermittent rib pain All other systems were reviewed with the patient and are negative.  PHYSICAL EXAMINATION: ECOG PERFORMANCE STATUS: 3  BP 91/51 mmHg  Pulse 117  Temp(Src) 98 F (36.7 C) (Oral)  Resp 18  Ht '5\' 8"'  (1.727 m)  Wt 162 lb 14.4 oz (73.891 kg)  BMI 24.77 kg/m2  SpO2 100%  GENERAL: He comes in today with a wheelchair, alert and oriented, no distress, chronically ill-appearing SKIN: skin color, texture, turgor are normal, no rashes or significant lesions except to some healing rash on his hands EYES: normal, conjunctiva are pink and non-injected, sclera clear OROPHARYNX:no exudate, no erythema and lips, buccal mucosa, and tongue normal  NECK: supple, thyroid normal size, non-tender, without nodularity LYMPH:  no palpable lymphadenopathy in the cervical, axillary or inguinal LUNGS: clear to auscultation and percussion with normal breathing effort HEART: regular rate & rhythm and no murmurs and no lower extremity edema ABDOMEN:abdomen soft, non-tender and normal bowel sounds Musculoskeletal:no cyanosis of digits and no clubbing, mild tenderness on bilateral lateral chest wall  PSYCH: alert & oriented x 3 with fluent speech NEURO: no focal motor/sensory deficits  LABORATORY DATA:   CBC Latest Ref Rng 09/30/2015 09/24/2015 09/16/2015  WBC 4.0 -  10.3 10e3/uL 9.0 8.9 6.4  Hemoglobin 13.0 - 17.1 g/dL 12.9(L) 13.1 13.0  Hematocrit 38.4 - 49.9 % 39.3 40.0 38.3(L)  Platelets 140 - 400 10e3/uL 163 141 130 with few platelet clumps(L)   CMP Latest Ref Rng 09/24/2015 09/16/2015 09/12/2015  Glucose 70 - 140 mg/dl 102 121 114  BUN 7.0 - 26.0 mg/dL 9.1 7.5 4.3(L)  Creatinine 0.7 - 1.3 mg/dL 1.2 1.1 1.0  Sodium 136 - 145 mEq/L 139 136 137  Potassium 3.5 - 5.1 mEq/L 3.2(L) 3.6 4.1  Chloride 101 - 111 mmol/L - - -  CO2 22 - 29 mEq/L 25 22 19(L)  Calcium 8.4 - 10.4 mg/dL 8.8 9.3 8.8  Total Protein 6.4 - 8.3 g/dL 5.2(L) 5.8(L) 5.7(L)  Total Bilirubin 0.20 - 1.20 mg/dL 0.58 0.79 0.72  Alkaline Phos 40 - 150 U/L 162(H) 160(H) 172(H)  AST 5 - 34 U/L 26 28 37(H)  ALT 0 - 55 U/L 25 32 42   MM lab: SPEP M-protein (g/dl) 03/15/2015: 1.6 05/24/2015: 0.5 06/21/2015: 0.4 07/18/2015 (at [facility]): 0.33  Serum IgG (mg/dl) 03/15/2015: 2000 05/24/2015: 579 06/21/2015: 673 07/18/2015 ([facility]): 509  Serum kappa/lamda/ratio 03/15/2015: 690, 0.13, 5308 05/24/2015: 146, 1.27, 115.0 06/21/2015: 86, 0.74, 116.2  24 h urine kappa light chain (mg) 03/18/2015: 2434 05/28/2015: 582 06/24/2015: 322   PATHOLOGY REPORT Bone Marrow, Aspirate,Biopsy, and Clot, right iliac 03/27/2015 - HYPERCELLULAR BONE MARROW FOR AGE WITH PLASMA CELL NEOPLASM. - SEE COMMENT. PERIPHERAL BLOOD: - NO SIGNIFICANT MORPHOLOGIC ABNORMALITIES. Diagnosis Note The bone marrow shows increased number of atypical plasma cells representing 47% of all cells in the aspirate associated with prominent interstitial infiltrates and variably sized aggregates in the clot and biopsy sections. Immunohistochemical stains show that the plasma cells are kappa light chain restricted consistent with plasma cell neoplasm. The background shows trilineage  Cytogenetics: normal FISH panel: +4 and +11  RADIOGRAPHIC STUDIES: Bone survey 03/19/2015 IMPRESSION: No discrete lytic lesion, but there is osteopenia  and calvarial heterogeneity -nonspecific findings which can be manifestations of Myeloma  ASSESSMENT & PLAN:  64 year old Caucasian male with past medical history of hypertension, tonsil cancer status post surgery and radiation 10 years ago, now presented with 3-4 months low back pain and intermittent rib pain. His blood test showed M protein 1.7g/dl.  1. IgG multiple myeloma, stage I, standard risk by cytogenetics (+4 and +11) -I discussed his bone marrow biopsy results with him. He has significant amount of plasma cells in the bone marrow 47%.  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 Based on the new notable myeloma diagnostic criteria, he meets the criteria of multiple myeloma. He has normal albumin and miildly elevated beta 2 microglobulin, this is stage I. -His cytogenetics and Fish panel revealed standard risk. -His thoracic and lumbar spine MRI showed pathologic compression fracture at T12, focal myeloma deposits at L3 and T9, diffuse marrow signal abnormality consistent with myeloma -I recommended induction chemotherapy with VRD regimen, weekly Velcade and dexamethasone, first 2 cycles Revlimid dose reduced to 10 mg daily 3 week on, one-week off, based on his renal function, and it which was increased to full dose from cycle 3. He received a total of 4 cycles of treatment, achieved a very good partial response. Repeat bone marrow on 07/18/2015 showed 5% plasma cells, fish and cytogenetics was normal. -His post transplant recovery has been quite slow, he is still very fatigued,  Appetite is slightly improved -Continue supportive care now -We briefly reviewed Revlimid maintenance therapy after transplant. This will be determined on his 100  day post transplant visit.  2. Orthostatic hypotension and chest pain  -  slightly improved since last week when he received IVF  - given the persistent hypotension,  Tachycardia, and chest pain, I'll obtain a CT chest with PE protocol today. - I'll also obtain a EKG and check a troponin -will obtain and ECHO if above work up unrevealing  - I have informed his cardiologist Dr. Debara Pickett,  [facility] see him on October 21  3. T12 pathologic compression fracture, and diffuse myeloma involvement in bones -Status post kyphoplasty and palliative radiation  -He received palliative Radiation, pain much improved  -continue pamidronate every 4 weeks, last dose 7/22, will resume when he recovers better from transplant  3. Back and rib pain, secondary to multiple myeloma bone lesions  -improved  -Continue tylenol as needed.  He could not tolerate any narcotics, I given him a  A prescription of tramadol 100 mg as needed - I'll also give him prescription of lidocaine patch,  For his bilateral chest pain - I'll refer him to palliative medicine NP [facility] for pain management  4 nonobstructive CAD, STEMI, Non ischemic CM, Takatsubo, with EF of 35-40% -Patient was initially found to have troponin elevation and anterior apical ST elevation MI and resultant cardiomyopathy on 04/26/15 -He underwent a cardiac catheterization on 5/3 which showed mild diffuse disease, no stents were placed. He is on aspirin -follow up with Dr. Percival Spanish -Repeat echo before transplant showed EF 55-60%  5. Fatigue and anorexia -Secondary to high-dose chemotherapy before transplant -I encouraged him to take more nutrition supplement, such as Ensure or boost -his appetite has improved  6. Advanced directives - He brought a copy of his advanced directives, we will scan it into EPIC -he is full code.   Plan -NS 532m today -CT chest PE protocol today, EKG - tramadol and a lidocaine patch for his chest pain, palliative medicine  referral -I will see him back next Friday with IVF if needed   All questions were answered. The patient knows to call the clinic with any problems, questions or concerns.  I spent 30 minutes counseling the patient face to face. The total time spent in the appointment was 40 minutes and more than 50% was on counseling.     FTruitt Merle MD 09/30/2015

## 2015-09-30 NOTE — Progress Notes (Signed)
Patient to infusion to receive 500 ml IVFs X 1 hour.  Patient also received EKG in infusion.  Labs drawn prior to arrival to infusion.

## 2015-09-30 NOTE — Telephone Encounter (Signed)
I called the patient back, and discussed his CT scan, troponins and EKG findings. His chest pain unlikely radius subacute rib fractures. The new T 11 compression fracture, I'll discuss with interventional radiologist Dr. call us in the next few days, to see if he would benefit from further kyphoplasty procedure.  Eric Dudley  09/30/2015

## 2015-09-30 NOTE — Patient Instructions (Signed)

## 2015-09-30 NOTE — Telephone Encounter (Signed)
Pt confirmed labs/ov per 10/03 POF, gave pt AVS and Calendar.... KJ, sent msg to MD to change order for CT Angio per Central Scheduling, and request referral order for NP/Mary in Radiation...Marland KitchenMarland Kitchen

## 2015-10-10 ENCOUNTER — Other Ambulatory Visit: Payer: Self-pay | Admitting: Hematology

## 2015-10-10 DIAGNOSIS — S22000S Wedge compression fracture of unspecified thoracic vertebra, sequela: Secondary | ICD-10-CM

## 2015-10-11 ENCOUNTER — Telehealth: Payer: Self-pay | Admitting: *Deleted

## 2015-10-11 ENCOUNTER — Ambulatory Visit (HOSPITAL_BASED_OUTPATIENT_CLINIC_OR_DEPARTMENT_OTHER): Payer: Medicare Other | Admitting: Hematology

## 2015-10-11 ENCOUNTER — Encounter: Payer: Self-pay | Admitting: Hematology

## 2015-10-11 ENCOUNTER — Other Ambulatory Visit (HOSPITAL_BASED_OUTPATIENT_CLINIC_OR_DEPARTMENT_OTHER): Payer: Medicare Other

## 2015-10-11 ENCOUNTER — Ambulatory Visit (HOSPITAL_BASED_OUTPATIENT_CLINIC_OR_DEPARTMENT_OTHER): Payer: Medicare Other

## 2015-10-11 VITALS — BP 120/71 | HR 103 | Temp 96.9°F | Resp 18 | Ht 68.0 in | Wt 153.8 lb

## 2015-10-11 DIAGNOSIS — G893 Neoplasm related pain (acute) (chronic): Secondary | ICD-10-CM

## 2015-10-11 DIAGNOSIS — C9000 Multiple myeloma not having achieved remission: Secondary | ICD-10-CM

## 2015-10-11 DIAGNOSIS — R530 Neoplastic (malignant) related fatigue: Secondary | ICD-10-CM | POA: Diagnosis not present

## 2015-10-11 LAB — CBC & DIFF AND RETIC
BASO%: 0.3 % (ref 0.0–2.0)
Basophils Absolute: 0 10*3/uL (ref 0.0–0.1)
EOS ABS: 0.2 10*3/uL (ref 0.0–0.5)
EOS%: 3.2 % (ref 0.0–7.0)
HCT: 43.9 % (ref 38.4–49.9)
HGB: 15 g/dL (ref 13.0–17.1)
IMMATURE RETIC FRACT: 7.1 % (ref 3.00–10.60)
LYMPH%: 10.3 % — ABNORMAL LOW (ref 14.0–49.0)
MCH: 36.7 pg — ABNORMAL HIGH (ref 27.2–33.4)
MCHC: 34.2 g/dL (ref 32.0–36.0)
MCV: 107.3 fL — ABNORMAL HIGH (ref 79.3–98.0)
MONO#: 1.3 10*3/uL — AB (ref 0.1–0.9)
MONO%: 17.5 % — ABNORMAL HIGH (ref 0.0–14.0)
NEUT%: 68.7 % (ref 39.0–75.0)
NEUTROS ABS: 5.1 10*3/uL (ref 1.5–6.5)
NRBC: 0 % (ref 0–0)
Platelets: 209 10*3/uL (ref 140–400)
RBC: 4.09 10*6/uL — AB (ref 4.20–5.82)
RDW: 15 % — AB (ref 11.0–14.6)
RETIC %: 2.76 % — AB (ref 0.80–1.80)
RETIC CT ABS: 112.88 10*3/uL — AB (ref 34.80–93.90)
WBC: 7.4 10*3/uL (ref 4.0–10.3)
lymph#: 0.8 10*3/uL — ABNORMAL LOW (ref 0.9–3.3)

## 2015-10-11 MED ORDER — SODIUM CHLORIDE 0.9 % IV SOLN
Freq: Once | INTRAVENOUS | Status: AC
  Administered 2015-10-11: 12:00:00 via INTRAVENOUS

## 2015-10-11 MED ORDER — ONDANSETRON HCL 4 MG PO TABS: 4.0000 mg | ORAL_TABLET | Freq: Four times a day (QID) | ORAL | Status: DC | PRN

## 2015-10-11 MED ORDER — SODIUM CHLORIDE 0.9 % IV SOLN
Freq: Once | INTRAVENOUS | Status: AC
  Administered 2015-10-11: 12:00:00 via INTRAVENOUS
  Filled 2015-10-11: qty 4

## 2015-10-11 MED ORDER — ZOLEDRONIC ACID 4 MG/100ML IV SOLN
4.0000 mg | Freq: Once | INTRAVENOUS | Status: AC
  Administered 2015-10-11: 4 mg via INTRAVENOUS
  Filled 2015-10-11: qty 100

## 2015-10-11 NOTE — Progress Notes (Signed)
[place]  [phone] [phone]) [phone]  Clinic Follow Up Note   Patient Care Team: [name], MD as PCP - General (Internal Medicine) Pixie Casino, MD as Consulting Physician (Cardiology) Truitt Merle, MD as Consulting Physician (Hematology) 10/11/2015   CHIEF COMPLAINTS: Follow-up multiple myeloma    Multiple myeloma ([place])   01/31/2015 Tumor Marker SPEP showed M-SPIKE 1.7g/dl, Cr 1.5, no anemia, hypercalcemia   03/19/2015 Imaging Bone survey showed no discrete lytic lesion, but there is osteopenia and calvarial heterogeneity.   03/27/2015 Initial Diagnosis Multiple myeloma, stage I   03/27/2015 Bone Marrow Biopsy Bone Marrow, Aspirate,Biopsy: - HYPERCELLULAR BONE MARROW FOR AGE WITH PLASMA CELL NEOPLASM 47% - SEE COMMENT. PERIPHERAL BLOOD: - NO SIGNIFICANT MORPHOLOGIC ABNORMALITIES. Diagnosis Note The bone marrow shows increased number of    03/27/2015 Miscellaneous bone marrow Cytogenetics: normal. FISH panel showed the presence of +4 and +11, this is consider standard risk of MM    04/16/2015 - 04/23/2015 Hospital Admission He was admitted for worsening back pain, was found to have a T12 pathological fracture. He underwent kyphoplasty on 04/19/2015.   04/19/2015 - 08/02/2015 Chemotherapy RVD with weekly Velcade 1.3 mg/m, dexamethasone 40 mg, and Revlimid 10 mg daily on day 1-21, every 28 days, cycle 1 Revlimid was interupted by hospitalization, cycle 3 Revlimid dose increased to 25 mg daily due to his improved renal function. total 4 cyc   04/26/2015 - 05/01/2015 Hospital Admission He was admitted to [facility] for syncope episode during his radiation simulation. EKG showed ST elevation, troponin was positive, he underwent cardio catheterization which showed mild stenosis. No intervention was needed. echo showed EF 35-40%   04/29/2015 - 05/10/2015 Radiation Therapy palliative RT to T12, 20Gy in 10 fractions   08/19/2015 Bone Marrow Transplant He underwent  autologous stem cell [facility] at [facility].      HISTORY OF PRESENTING ILLNESS (03/15/2015):  Eric Dudley 64 y.o. 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  He has been having low back pain for 3-4 months. He had food posinin 4 month ago, and had frequent diarrhea. He started noticed low back pain since then. The pain is not radiating to leg, he also has intermittent right rib pain, worse with cough and sneezing. He remains to be physically active, able to do all the activities, such as gardening work housework without much limitation, but he doesn't sings slowly because of back pain. He takes Tylenol as needed, does not like the necrotic pain medication. No night sweats, no fever or chillss, no weight loss.   He was seen by his primary care physician Dr. [name], MD and had lab test (see below). He also had bone density scan 2/25 which showed osteoporosis, has not been treated yet.  Current therapy: observation   INTERIM HISTORY: Eric Dudley returns for follow-up. He is accompanied by his wife to the clinic today. He feels better overall, less dizziness, but he did have an episode of presyncope yesterday when he walked to bathroom. He said down right away and did not loss his consciousness. The lidocaine patch helps with his rib pain, he has mild intermittent mid back pain also. He is delivered a more active, walks around the house and goes to mailbox. He has not gone out much after his transplant. No fever or chills. His appetite has improved, he is eating better, he  still has intermittent nausea, for which he takes Zofran. No other new complaints.  MEDICAL HISTORY:  Past Medical History  Diagnosis Date  . Hypertension   . Dyslipidemia   . 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 Cholelithiases   . Hyperlipidemia   . Chronic kidney disease      MILD,CHRONIC    SURGICAL HISTORY: Past Surgical History  Procedure Laterality Date  . Appendectomy  1962  . Shoulder surgery  1999    left  . Knee surgery  2012    right  . Cardiac catheterization N/A 04/30/2015    Procedure: Left Heart Cath and Coronary Angiography;  Surgeon: Peter M Martinique, MD;  Location: [facility] INVASIVE CV LAB CUPID;  Service: Cardiovascular;  Laterality: N/A;  . Cardiolite myocardial perfusion study  04/16/03    NEGITIVE BRUCE PROTOCAL EXERCISE STRESS TEST. EF 70%. NO ISCHEMIA.    SOCIAL HISTORY: History   Social History  . Marital Status: Married    Spouse Name: N/A  . Number of Children: 1   . Years of Education: N/A   Occupational History  . A retired Therapist, occupational    Social History Main Topics  . Smoking status: Former Smoker    Types: Cigars    Quit date: 12/29/2003  . Smokeless tobacco: Former Systems developer    Types: Chew    Quit date: 12/28/1994  . Alcohol Use: Yes     Comment: occasional beer  . Drug Use: No  . Sexual Activity: Not on file   Other Topics Concern  . Not on file   Social History Narrative  . No narrative on file    FAMILY HISTORY: Family History  Problem Relation Age of Onset  . Cancer Mother 78    cervical cancer   . Cancer Father     unknown cancer   . Hypertension Sister   . Cancer Sister     melanoma     ALLERGIES:  is allergic to hydrochlorothiazide; asa; oxycodone; amoxicillin; and indapamide.  MEDICATIONS:  Current Outpatient Prescriptions  Medication Sig Dispense Refill  . acyclovir (ZOVIRAX) 400 MG tablet Take 1 tablet (400 mg total) by mouth 2 (two) times daily. 180 tablet 1  . aspirin 81 MG chewable tablet Chew 1 tablet (81 mg total) by mouth daily.    . calcium carbonate (OS-CAL) 600 MG TABS tablet Take 1,200 mg by mouth daily with breakfast.    . clopidogrel (PLAVIX) 75 MG tablet Take 1 tablet (75 mg total) by mouth daily. 90 tablet 3  . cyclobenzaprine (FLEXERIL) 5 MG tablet Take 1 tablet (5 mg total) by  mouth 3 (three) times daily. (Patient taking differently: Take 5 mg by mouth 2 (two) times daily. ) 90 tablet 1  . ezetimibe-simvastatin (VYTORIN) 10-40 MG per tablet Take 1 tablet by mouth at bedtime.    . folic acid (FOLVITE) 1 MG tablet Take 1 mg by mouth daily. Takes for 6 months.    . lidocaine (LIDODERM) 5 % Place 1 patch onto the skin daily. Remove & Discard patch within 12 hours or as directed by MD 30 patch 0  . metoprolol succinate (TOPROL-XL) 25 MG 24 hr tablet Take 0.5 tablets (12.5 mg total) by mouth daily. 45 tablet 3  . Multiple Vitamins-Minerals (CENTRUM SILVER ULTRA MENS PO) Take 1 tablet by mouth daily.    . ondansetron (ZOFRAN) 4 MG tablet Take 1 tablet (4 mg total) by mouth 4 (four) times daily as needed for nausea or vomiting. 40 tablet 2  .  potassium chloride (MICRO-K) 10 MEQ CR capsule Take 20 mEq by mouth daily.    . prochlorperazine (COMPAZINE) 10 MG tablet Take 10 mg by mouth every 6 (six) hours as needed.     . ranitidine (ZANTAC) 150 MG tablet Take 2 tablets (300 mg total) by mouth at bedtime. 60 tablet 1  . sulfamethoxazole-trimethoprim (BACTRIM DS,SEPTRA DS) 800-160 MG per tablet Take 1 tablet by mouth as directed. Take 1 tablet every   Mon -  Wed  -  Fri   Begin day  30   (  9/26  Through   02/24/16 )  6  Months.    . traMADol (ULTRAM) 50 MG tablet Take 2 tablets (100 mg total) by mouth every 6 (six) hours as needed. 60 tablet 1  . metoCLOPramide (REGLAN) 10 MG tablet Take 10 mg by mouth 4 (four) times daily.     No current facility-administered medications for this visit.    REVIEW OF SYSTEMS:   Constitutional: Denies fevers, chills or abnormal night sweats Eyes: Denies blurriness of vision, double vision or watery eyes Ears, nose, mouth, throat, and face: Denies mucositis or sore throat Respiratory: Denies cough, dyspnea or wheezes Cardiovascular: Denies palpitation, chest discomfort or lower extremity swelling Gastrointestinal:  Denies nausea, heartburn or  change in bowel habits Skin: Denies abnormal skin rashes Lymphatics: Denies new lymphadenopathy or easy bruising Neurological:Denies numbness, tingling or new weaknesses Behavioral/Psych: Mood is stable, no new changes  Musculoskeletal: Positive for back pain and intermittent rib pain All other systems were reviewed with the patient and are negative.  PHYSICAL EXAMINATION: ECOG PERFORMANCE STATUS: 3  BP 120/71 mmHg  Pulse 103  Temp(Src) 96.9 F (36.1 C) (Oral)  Resp 18  Ht '5\' 8"'  (1.727 m)  Wt 153 lb 12.8 oz (69.763 kg)  BMI 23.39 kg/m2  SpO2 99%  GENERAL: He comes in today with a wheelchair, alert and oriented, no distress, chronically ill-appearing SKIN: skin color, texture, turgor are normal, no rashes or significant lesions except to some healing rash on his hands EYES: normal, conjunctiva are pink and non-injected, sclera clear OROPHARYNX:no exudate, no erythema and lips, buccal mucosa, and tongue normal  NECK: supple, thyroid normal size, non-tender, without nodularity LYMPH:  no palpable lymphadenopathy in the cervical, axillary or inguinal LUNGS: clear to auscultation and percussion with normal breathing effort HEART: regular rate & rhythm and no murmurs and no lower extremity edema ABDOMEN:abdomen soft, non-tender and normal bowel sounds Musculoskeletal:no cyanosis of digits and no clubbing, mild tenderness on bilateral lateral chest wall  PSYCH: alert & oriented x 3 with fluent speech NEURO: no focal motor/sensory deficits  LABORATORY DATA:   CBC Latest Ref Rng 10/11/2015 09/30/2015 09/24/2015  WBC 4.0 - 10.3 10e3/uL 7.4 9.0 8.9  Hemoglobin 13.0 - 17.1 g/dL 15.0 12.9(L) 13.1  Hematocrit 38.4 - 49.9 % 43.9 39.3 40.0  Platelets 140 - 400 10e3/uL 209 163 141   CMP Latest Ref Rng 09/30/2015 09/24/2015 09/16/2015  Glucose 70 - 140 mg/dl 103 102 121  BUN 7.0 - 26.0 mg/dL 5.8(L) 9.1 7.5  Creatinine 0.7 - 1.3 mg/dL 1.2 1.2 1.1  Sodium 136 - 145 mEq/L 138 139 136  Potassium  3.5 - 5.1 mEq/L 3.5 3.2(L) 3.6  Chloride 101 - 111 mmol/L - - -  CO2 22 - 29 mEq/L 21(L) 25 22  Calcium 8.4 - 10.4 mg/dL 9.0 8.8 9.3  Total Protein 6.4 - 8.3 g/dL 5.3(L) 5.2(L) 5.8(L)  Total Bilirubin 0.20 - 1.20 mg/dL 0.42 0.58 0.79  Alkaline Phos 40 - 150 U/L 178(H) 162(H) 160(H)  AST 5 - 34 U/L '30 26 28  ' ALT 0 - 55 U/L 25 25 32   MM lab: SPEP M-protein (g/dl) 03/15/2015: 1.6 05/24/2015: 0.5 06/21/2015: 0.4 07/18/2015 (at [facility]): 0.33  Serum IgG (mg/dl) 03/15/2015: 2000 05/24/2015: 579 06/21/2015: 673 07/18/2015 ([facility]): 509  Serum kappa/lamda/ratio 03/15/2015: 690, 0.13, 5308 05/24/2015: 146, 1.27, 115.0 06/21/2015: 86, 0.74, 116.2  24 h urine kappa light chain (mg) 03/18/2015: 2434 05/28/2015: 582 06/24/2015: 322   PATHOLOGY REPORT Bone Marrow, Aspirate,Biopsy, and Clot, right iliac 03/27/2015 - HYPERCELLULAR BONE MARROW FOR AGE WITH PLASMA CELL NEOPLASM. - SEE COMMENT. PERIPHERAL BLOOD: - NO SIGNIFICANT MORPHOLOGIC ABNORMALITIES. Diagnosis Note The bone marrow shows increased number of atypical plasma cells representing 47% of all cells in the aspirate associated with prominent interstitial infiltrates and variably sized aggregates in the clot and biopsy sections. Immunohistochemical stains show that the plasma cells are kappa light chain restricted consistent with plasma cell neoplasm. The background shows trilineage  Cytogenetics: normal FISH panel: +4 and +11  RADIOGRAPHIC STUDIES: CT anginal chest 09/30/2015 IMPRESSION: 1. No pulmonary embolism. 2. No thoracic aortic aneurysm or dissection. 3. Heart size is normal. No pericardial effusion. 4. Mild scarring/atelectasis at each lung base, probably chronic. Lungs otherwise clear. No evidence of pneumonia. No pleural effusion. 5. Evidence of diffuse multiple myeloma involvement throughout the thoracolumbar spine and multiple ribs bilaterally, with areas of present clinical relevance described below. 6.  Compression fracture deformity of the T12 vertebral body, known pathologic fracture, status post treatment with vertebroplasty on 04/19/2015. 7. Milder compression deformity of the overlying T11 vertebral body, approximately 40% compressed anteriorly, also presumably pathologic. This vertebral body appeared essentially normal on fluoroscopic images from the earlier aforementioned vertebroplasty of 04/19/2015. 8. Slight irregularity of the right lateral sixth rib, with subtle cortical sclerosis suggesting nondisplaced healing fracture. This may be a relatively acute fracture and is probably pathologic in nature related to underlying multiple myeloma. 9. Similar irregularity of the left lateral fifth rib, suggesting minimally displaced healing fracture. This may also be a relatively acute fracture and is likely pathologic in nature related to underlying multiple myeloma. These acute or subacute rib fractures are the most likely source of patient's bilateral chest pain. Additional incidental findings in the upper abdomen: Fatty infiltration of liver, cholelithiasis without evidence of acute cholecystitis, fairly large amount of stool within the nondistended colon of the upper abdomen (constipation? ).   ASSESSMENT & PLAN:  64 year old Caucasian male with past medical history of hypertension, tonsil cancer status post surgery and radiation 10 years ago, now presented with 3-4 months low back pain and intermittent rib pain. His blood test showed M protein 1.7g/dl.  1. IgG multiple myeloma, stage I, standard risk by cytogenetics (+4 and +11) -I discussed his bone marrow biopsy results with him. He has significant amount of plasma cells in the bone marrow 47%.  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 Based on the new notable myeloma diagnostic criteria, he meets the criteria of multiple myeloma. He has normal albumin and miildly elevated beta 2 microglobulin, this is stage I. -His cytogenetics and Fish panel revealed standard risk. -His thoracic and lumbar spine MRI showed pathologic compression fracture at T12, focal myeloma deposits at  L3 and T9, diffuse marrow signal abnormality consistent with myeloma -I recommended induction chemotherapy with VRD regimen, weekly Velcade and dexamethasone, first 2 cycles Revlimid dose reduced to 10 mg daily 3 week on, one-week off, based on his renal function, and it which was increased to full dose from cycle 3. He received a total of 4 cycles of treatment, achieved a very good partial response. Repeat bone marrow on 07/18/2015 showed 5% plasma cells, fish and cytogenetics was normal. -His post transplant recovery has been quite slow, but his overall condition has improved lately. -Continue supportive care now -We briefly reviewed Revlimid maintenance therapy after transplant. This will be determined on his 100 day post transplant visit.  2. Orthostatic hypotension and presyncope -His blood pressure has improved, normal today -He is scheduled to see his cardiologist Dr. Debara Pickett next week -He knows to sit down if he feels dizzy, to avoid syncope episodes. -I encouraged him to drink a little bit more oral fluids, he is doing well on that  3. T12 pathologic compression fracture, and diffuse myeloma involvement in bones -Status post kyphoplasty and palliative radiation  -He received palliative Radiation, pain much improved  -continue pamidronate every 4 weeks, last dose 7/22, will resume. His renal function are normal lately, will change to zometa infusion, which is much short infusion, first dose today   3. Back and rib pain, secondary to multiple myeloma bone lesions  -improved  -Continue  tylenol, tramadol 100 mg as needed and lidocaine patch -He is scheduled to see IR Dr. Barbie Banner on 10/26 for his new T11 compression   4 nonobstructive CAD, STEMI, Non ischemic CM, Tak[place]ubo, with EF of 35-40% -Patient was initially found to have troponin elevation and anterior apical ST elevation MI and resultant cardiomyopathy on 04/26/15 -He underwent a cardiac catheterization on 5/3 which showed mild diffuse disease, no stents were placed. He is on aspirin -follow up with Dr. Debara Pickett next week  -Repeat echo before transplant showed EF 55-60%  5. Fatigue and anorexia -Secondary to high-dose chemotherapy before transplant -I encouraged him to take more nutrition supplement, such as Ensure or boost -his appetite has improved  6. Advanced directives - He brought a copy of his advanced directives, we scaned it into EPIC -he is full code.   Plan -Zometa infusion today and monthly afterwards -I'll see him back in 1 months for follow-up.  All questions were answered. The patient knows to call the clinic with any problems, questions or concerns.  I spent 30 minutes counseling the patient face to face. The total time spent in the appointment was 40 minutes and more than 50% was on counseling.     Truitt Merle, MD 10/11/2015

## 2015-10-11 NOTE — Telephone Encounter (Signed)
Per staff message and POF I have scheduled appts. Advised scheduler of appts. JMW  

## 2015-10-11 NOTE — Patient Instructions (Signed)

## 2015-10-14 ENCOUNTER — Telehealth: Payer: Self-pay | Admitting: Hematology

## 2015-10-14 NOTE — Telephone Encounter (Signed)
per pof to sch pt appt-cld & spoke to pt to give appt time &date for 11/14

## 2015-10-17 ENCOUNTER — Encounter: Payer: Self-pay | Admitting: Internal Medicine

## 2015-10-17 ENCOUNTER — Ambulatory Visit (INDEPENDENT_AMBULATORY_CARE_PROVIDER_SITE_OTHER): Payer: Medicare Other | Admitting: Internal Medicine

## 2015-10-17 VITALS — Ht 68.5 in | Wt 149.3 lb

## 2015-10-17 DIAGNOSIS — I951 Orthostatic hypotension: Secondary | ICD-10-CM

## 2015-10-17 DIAGNOSIS — I214 Non-ST elevation (NSTEMI) myocardial infarction: Secondary | ICD-10-CM | POA: Diagnosis not present

## 2015-10-17 DIAGNOSIS — I429 Cardiomyopathy, unspecified: Secondary | ICD-10-CM

## 2015-10-17 DIAGNOSIS — R000 Tachycardia, unspecified: Secondary | ICD-10-CM | POA: Diagnosis not present

## 2015-10-17 DIAGNOSIS — I428 Other cardiomyopathies: Secondary | ICD-10-CM

## 2015-10-17 MED ORDER — MIDODRINE HCL 10 MG PO TABS: 10.0000 mg | ORAL_TABLET | Freq: Three times a day (TID) | ORAL | Status: DC

## 2015-10-17 NOTE — Progress Notes (Signed)
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 He has been on Vytorin 10/40 mg. Recently, a lipid NMR demonstrated a high particle number at 1323 with an LDL cholesterol of 67, indicating increased risk. He also underwent a [place] heart protocol per your request, which showed an elevated lipoprotein(a) at 155 and elevated insulin level, suggesting early insulin resistance. Hemoglobin was 5.8. APOB levels were mildly elevated as well as APOA1 fractions. However, overall cholesterol control is fairly good. In the past, I had recommended changing him to Crestor; however, apparently he was intolerant to this in the past as well as Lipitor and another statin, which he does not recall. He is on low-dose Diovan for hypertension and had been on Norvasc in the past. The Diovan was apparently started by Dr. Einar Gip prior to my caring for him and I feel that this is a reasonable medicine for blood pressure control. Symptomatically, he denies any chest pain, shortness of breath, palpitations, presyncope, or syncopal symptoms.   I the pleasure see Mr. Brocious back in the office today. He was recently hospitalized after presenting with a syncopal event. He was admitted on 4/19 for acute back pain where he was found to have a pathological T12 fracture IR consulted and recommended kyphoplasty which was performed on 04/19/15.Patient came to his regular scheduled injection of Velcade at the cancer center 4/29 and was being mapped for XRT and while being transferred from chair to table he was dropped down below usual 30. Subsequent to this he had an episode wherein he had severe pain and became unresponsive for about 20 seconds. EKG demonstrated  anteroapical ST elevation and he had car catheterization on 04/30/2015. This demonstratedProx RCA lesion, 30% stenosed. Ost LM to LM lesion, 30% stenosed, normal LVEDP. Echocardiography, however demonstrated reduced LV function with an EF of 35-40% and a large anteroapical aneurysm. The etiology of this aneurysm is not quite clear but thought to be related to prior radiation therapy and or chemotherapy for tonsillar cancer. He did suffer an NSTEMI, with troponin elevation to greater than 4. He was placed on aspirin and appropriately Plavix. There was some question as to whether he needed to continue this, however based on the CURE trial data, there is indication for therapy at least one year even in patients who did not have prior stenting. There is also new are data using Brilinta with similar if not better outcome data. Currently he reports some fatigue and had an episode yesterday where he vomited number of times and is somewhat weak today. Blood pressure is low normal.  Mr. Vickerman returns today for follow-up. He has been diagnosed with multiple myeloma and recently underwent bone marrow transplant. During that time he had congestive heart failure exacerbation. Prior to transplant his EF has improved and in fact a repeat echocardiogram performed on 9 7 2016 demonstrated an EF of 65-70%.  He's not currently on heart failure medication other than low-dose metoprolol XL 12.5 mg daily due to hypotension.  He is also on low-dose aspirin 81 mg and Plavix 75 mg.   As a reminder, there is no significant obstructive disease by cath in April and the dual antiplatelets therapy is for non-STEMI..  Mr. Knoble was seen back in the office today  for follow-up. He continues to have symptoms of lower orthostatic hypotension. Orthostatic vitals today indicated a drop of blood pressure by 30 mmHg. He's also noted to be tachycardic. He was dizzy with change in position. As last office visit I stopped his Aldactone which he says  has not made a significant difference in his symptoms. He is on low-dose beta blocker but remains tachycardic. I suspect this may be related to either multiple myeloma or his recent stem cell transplant. He recently had repeat echocardiogram as described above which showed improvement in LV function.  PMHx:  Past Medical History  Diagnosis Date  . Hypertension   . Dyslipidemia   . Tonsillar cancer ([place]) 2006  . Cholelithiases   . Hyperlipidemia   . Chronic kidney disease     [place]    Past Surgical History  Procedure Laterality Date  . Appendectomy  1962  . Shoulder surgery  1999    left  . Knee surgery  2012    right  . Cardiac catheterization N/A 04/30/2015    Procedure: Left Heart Cath and Coronary Angiography;  Surgeon: Peter M Martinique, MD;  Location: [facility] INVASIVE CV LAB CUPID;  Service: Cardiovascular;  Laterality: N/A;  . Cardiolite myocardial perfusion study  04/16/03    NEGITIVE BRUCE PROTOCAL EXERCISE STRESS TEST. EF 70%. NO ISCHEMIA.    FAMHx:  Family History  Problem Relation Age of Onset  . Cancer Mother 58    cervical cancer   . Cancer Father     unknown cancer   . Hypertension Sister   . Cancer Sister     melanoma     SOCHx:   reports that he quit smoking about 11 years ago. His smoking use included Cigars. He quit smokeless tobacco use about 20 years ago. His smokeless tobacco use included Chew. He reports that he drinks alcohol. He reports that he does not use illicit drugs.  ALLERGIES:  Allergies  Allergen Reactions  . Hydrochlorothiazide Rash and Palpitations  . Asa [Aspirin] Nausea Only  . Calcium Carbonate Nausea And Vomiting  . Oxycodone Other (See Comments)    Patient does not want to take. Severe Hallucinations.  . Amoxicillin Itching and Rash  . Indapamide Palpitations    Lightheadedness, dizziness    ROS: A comprehensive review of systems was negative except for: Constitutional: positive for fatigue and Weakness Cardiovascular:  positive for near-syncope and syncope  HOME MEDS: Current Outpatient Prescriptions  Medication Sig Dispense Refill  . acyclovir (ZOVIRAX) 400 MG tablet Take 1 tablet (400 mg total) by mouth 2 (two) times daily. 180 tablet 1  . aspirin 81 MG chewable tablet Chew 1 tablet (81 mg total) by mouth daily.    . calcium carbonate (OS-CAL) 600 MG TABS tablet Take 1,200 mg by mouth daily with breakfast.    . clopidogrel (PLAVIX) 75 MG tablet Take 1 tablet (75 mg total) by mouth daily. 90 tablet 3  . cyclobenzaprine (FLEXERIL) 5 MG tablet Take 1 tablet (5 mg total) by mouth 3 (three) times daily. (Patient taking differently: Take 5 mg by mouth 2 (two) times daily. ) 90 tablet 1  . ezetimibe-simvastatin (VYTORIN) 10-40 MG per tablet Take 1 tablet by mouth at bedtime.    . folic acid (FOLVITE) 1 MG tablet Take 1 mg by mouth daily. Takes for 6 months.    . lidocaine (LIDODERM) 5 % Place 1 patch onto the skin daily. Remove & Discard patch within 12 hours or as directed by MD 30 patch  0  . metoCLOPramide (REGLAN) 10 MG tablet Take 10 mg by mouth 4 (four) times daily.    . Multiple Vitamins-Minerals (CENTRUM SILVER ULTRA MENS PO) Take 1 tablet by mouth daily.    . ondansetron (ZOFRAN) 4 MG tablet Take 1 tablet (4 mg total) by mouth 4 (four) times daily as needed for nausea or vomiting. 40 tablet 2  . potassium chloride (MICRO-K) 10 MEQ CR capsule Take 20 mEq by mouth daily.    . prochlorperazine (COMPAZINE) 10 MG tablet Take 10 mg by mouth every 6 (six) hours as needed.     . ranitidine (ZANTAC) 150 MG tablet Take 2 tablets (300 mg total) by mouth at bedtime. 60 tablet 1  . sulfamethoxazole-trimethoprim (BACTRIM DS,SEPTRA DS) 800-160 MG per tablet Take 1 tablet by mouth as directed. Take 1 tablet every   Mon -  Wed  -  Fri   Begin day  30   (  9/26  Through   02/24/16 )  6  Months.    . traMADol (ULTRAM) 50 MG tablet Take 2 tablets (100 mg total) by mouth every 6 (six) hours as needed. 60 tablet 1  . midodrine  (PROAMATINE) 10 MG tablet Take 1 tablet (10 mg total) by mouth 3 (three) times daily. 90 tablet 6   No current facility-administered medications for this visit.    LABS/IMAGING: No results found for this or any previous visit (from the past 48 hour(s)). No results found.  VITALS: BP   Ht 5' 8.5" (1.74 m)  Wt 149 lb 4.8 oz (67.722 kg)  BMI 22.37 kg/m2  EXAM: Deferred  EKG:  sinus tachycardia at 113  ASSESSMENT: 1. Nonischemic cardiomyopathy, EF 35-40% - improved to 65-70% by echo at [facility] on 09/04/15 2. 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 Hypertension 4. Dyslipidemia 5. History of tonsillar cancer 6. Multiple myeloma-s/p BMT 7. CKD2 8. Orthostatic hypotension with syncope  PLAN: 1.   Mr. Goodbar remains tachycardic and has been orthostatic with demonstrated syncopal episodes. At this point I believe we need to discontinue his beta blocker completely. I also like to start him on low-dose midodrine 10 mg by mouth 3 times a day.  I'll plan to see him back in a few weeks to see if we've had any improvement in his blood pressures.   Pixie Casino, MD, [facility] Attending Cardiologist [facility]  Reedy Biernat Hilty 10/17/2015, 1:15 PM

## 2015-10-17 NOTE — Patient Instructions (Signed)
Your physician has recommended you make the following change in your medication...  1. STOP metoprolol  2. START midodrine 10mg  three times daily  Your physician recommends that you schedule a follow-up appointment in: ONE MONTH with Dr. Debara Pickett  Dr. Debara Pickett recommends that you purchase THIGH HIGH compression stockings  - strength = 20-42mmHg

## 2015-10-21 ENCOUNTER — Ambulatory Visit: Payer: Medicare Other | Admitting: Hematology

## 2015-10-23 ENCOUNTER — Ambulatory Visit
Admission: RE | Admit: 2015-10-23 | Discharge: 2015-10-23 | Disposition: A | Discharge status: Home / Self Care | Payer: Medicare Other | Source: Ambulatory Visit | Attending: Hematology | Admitting: Hematology

## 2015-10-23 DIAGNOSIS — S22000S Wedge compression fracture of unspecified thoracic vertebra, sequela: Secondary | ICD-10-CM | POA: Diagnosis not present

## 2015-10-23 DIAGNOSIS — N419 Inflammatory disease of prostate, unspecified: Secondary | ICD-10-CM | POA: Diagnosis not present

## 2015-10-23 DIAGNOSIS — S22000A Wedge compression fracture of unspecified thoracic vertebra, initial encounter for closed fracture: Secondary | ICD-10-CM | POA: Insufficient documentation

## 2015-10-23 DIAGNOSIS — R3911 Hesitancy of micturition: Secondary | ICD-10-CM | POA: Diagnosis not present

## 2015-10-23 DIAGNOSIS — R399 Unspecified symptoms and signs involving the genitourinary system: Secondary | ICD-10-CM | POA: Diagnosis not present

## 2015-10-23 NOTE — Progress Notes (Signed)
Patient ID: Eric Dudley, male   DOB: [birth year], 64 y.o.   MRN: [id number]    Chief Complaint: Patient was seen in consultation today for  Chief Complaint  Patient presents with  . Follow-up    6 mo follow up Kyphoplasty     at the request of Feng,Yan  Referring Physician(s): Feng,Yan  History of Present Illness: Eric Dudley is a 64 y.o. male who had a T12 kyphoplasty several months ago. He has a history of multiple myeloma with several bone lesions including vertebrae and ribs. He underwent a bone marrow transplant one month ago and is recovering. Presently, he only has minimal lumbar back pain. He is not taking any narcotics and is sleeping okay. He is ambulating and does use a cane, mainly for balance. He denies weakness or numbness. He did undergo CT scans at [facility] in early September which did describe a new mild T11 compression deformity above the T12 treated level.  Past Medical History  Diagnosis Date  . Hypertension   . Dyslipidemia   . Tonsillar cancer ([place]) 2006  . Cholelithiases   . Hyperlipidemia   . Chronic kidney disease     [place]    Past Surgical History  Procedure Laterality Date  . Appendectomy  1962  . Shoulder surgery  1999    left  . Knee surgery  2012    right  . Cardiac catheterization N/A 04/30/2015    Procedure: Left Heart Cath and Coronary Angiography;  Surgeon: Peter M Martinique, MD;  Location: [facility] INVASIVE CV LAB CUPID;  Service: Cardiovascular;  Laterality: N/A;  . Cardiolite myocardial perfusion study  04/16/03    NEGITIVE BRUCE PROTOCAL EXERCISE STRESS TEST. EF 70%. NO ISCHEMIA.    Allergies: Hydrochlorothiazide; Asa; Calcium carbonate; Oxycodone; Amoxicillin; and Indapamide  Medications: Prior to Admission medications   Medication Sig Start Date End Date Taking? Authorizing Provider  acyclovir (ZOVIRAX) 400 MG tablet Take 1 tablet (400 mg total) by mouth 2 (two) times daily. 04/12/15  Yes Truitt Merle, MD  aspirin 81 MG  chewable tablet Chew 1 tablet (81 mg total) by mouth daily. 05/01/15  Yes Costin Karlyne Greenspan, MD  calcium carbonate (OS-CAL) 600 MG TABS tablet Take 1,200 mg by mouth daily with breakfast.   Yes Historical Provider, MD  clopidogrel (PLAVIX) 75 MG tablet Take 1 tablet (75 mg total) by mouth daily. 05/28/15  Yes Pixie Casino, MD  ezetimibe-simvastatin (VYTORIN) 10-40 MG per tablet Take 1 tablet by mouth at bedtime.   Yes Historical Provider, MD  folic acid (FOLVITE) 1 MG tablet Take 1 mg by mouth daily. Takes for 6 months. 09/04/15 02/04/16 Yes Historical Provider, MD  lidocaine (LIDODERM) 5 % Place 1 patch onto the skin daily. Remove & Discard patch within 12 hours or as directed by MD 09/30/15  Yes Truitt Merle, MD  metoCLOPramide (REGLAN) 10 MG tablet Take 10 mg by mouth 4 (four) times daily.   Yes Historical Provider, MD  midodrine (PROAMATINE) 10 MG tablet Take 1 tablet (10 mg total) by mouth 3 (three) times daily. 10/17/15  Yes Pixie Casino, MD  Multiple Vitamins-Minerals (CENTRUM SILVER ULTRA MENS PO) Take 1 tablet by mouth daily.   Yes Historical Provider, MD  ondansetron (ZOFRAN) 4 MG tablet Take 1 tablet (4 mg total) by mouth 4 (four) times daily as needed for nausea or vomiting. 10/11/15  Yes Truitt Merle, MD  potassium chloride (MICRO-K) 10 MEQ CR capsule Take 20 mEq by mouth daily. 09/04/15  12/03/15 Yes Historical Provider, MD  prochlorperazine (COMPAZINE) 10 MG tablet Take 10 mg by mouth every 6 (six) hours as needed.  08/19/15  Yes Historical Provider, MD  ranitidine (ZANTAC) 150 MG tablet Take 2 tablets (300 mg total) by mouth at bedtime. 05/24/15  Yes Truitt Merle, MD  sulfamethoxazole-trimethoprim (BACTRIM DS,SEPTRA DS) 800-160 MG per tablet Take 1 tablet by mouth as directed. Take 1 tablet every   Mon -  Wed  -  Fri   Begin day  30   (  9/26  Through   02/24/16 )  6  Months.   Yes Historical Provider, MD  traMADol (ULTRAM) 50 MG tablet Take 2 tablets (100 mg total) by mouth every 6 (six) hours as needed.  09/30/15  Yes Truitt Merle, MD  cyclobenzaprine (FLEXERIL) 5 MG tablet Take 1 tablet (5 mg total) by mouth 3 (three) times daily. Patient not taking: Reported on 10/23/2015 06/28/15   Truitt Merle, MD     Family History  Problem Relation Age of Onset  . Cancer Mother 65    cervical cancer   . Cancer Father     unknown cancer   . Hypertension Sister   . Cancer Sister     melanoma     Social History   Social History  . Marital Status: Married    Spouse Name: N/A  . Number of Children: N/A  . Years of Education: N/A   Social History Main Topics  . Smoking status: Former Smoker    Types: Cigars    Quit date: 12/29/2003  . Smokeless tobacco: Former Systems developer    Types: Chew    Quit date: 12/28/1994  . Alcohol Use: Yes     Comment: occasional beer  . Drug Use: No  . Sexual Activity: Not on file   Other Topics Concern  . Not on file   Social History Narrative      Review of Systems: A 12 point ROS discussed and pertinent positives are indicated in the HPI above.  All other systems are negative.  Review of Systems  Vital Signs: BP 132/75 mmHg  Pulse 110  Temp(Src) 97.5 F (36.4 C) (Oral)  Resp 14  Ht 5' 8.5" (1.74 m)  Wt 145 lb (65.772 kg)  BMI 21.72 kg/m2  SpO2 100%  Physical Exam  Constitutional: He is oriented to person, place, and time. He appears well-developed and well-nourished.  Musculoskeletal:  There is no midline back tenderness  Neurological: He is alert and oriented to person, place, and time.  Motor and sensory are 5/5 throughout.    Mallampati Score:     Imaging: Ct Angio Chest Pe W/cm &/or Wo Cm  09/30/2015  CLINICAL DATA:  Evaluate for PE. Right and left-sided chest pain. Shortness of breath on exertion. History of multiple myeloma diagnosed earlier this year. History of tonsillar carcinoma 2006. Known T12 pathologic fracture. EXAM: CT ANGIOGRAPHY CHEST WITH CONTRAST TECHNIQUE: Multidetector CT imaging of the chest was performed using the standard  protocol during bolus administration of intravenous contrast. Multiplanar CT image reconstructions and MIPs were obtained to evaluate the vascular anatomy. CONTRAST:  53m OMNIPAQUE IOHEXOL 350 MG/ML SOLN COMPARISON:  None. FINDINGS: There is no pulmonary embolism identified within the main, lobar, or segmental pulmonary arteries bilaterally. Heart size is normal. No pericardial effusion. Scattered coronary artery calcifications are present. No mass or enlarged lymph nodes seen within the mediastinum or perihilar regions. Scattered atherosclerotic changes noted along the walls of the normal-caliber thoracic aorta. There  is mild scarring/atelectasis within the lower lobes bilaterally. Lungs are otherwise clear. No pleural effusion. No pneumothorax. Trachea and central bronchi are unremarkable. Small layering stones are seen within the otherwise normal-appearing gallbladder. Liver is low in density suggesting fatty infiltration. Fairly large amount of stool is present within the nondistended colon of the upper abdomen. Limited images of the upper abdomen are otherwise unremarkable. As indicated in the clinical data, there is a compression fracture deformity of the T12 vertebral body with associated vertebroplasty. There is a milder compression deformity of the overlying T11 vertebral body, also presumably pathologic, new compared to fluoroscopic images of the spine dated 04/19/2015. Review of the MIP images confirms the above findings. IMPRESSION: 1. No pulmonary embolism. 2. No thoracic aortic aneurysm or dissection. 3. Heart size is normal.  No pericardial effusion. 4. Mild scarring/atelectasis at each lung base, probably chronic. Lungs otherwise clear. No evidence of pneumonia. No pleural effusion. 5. Evidence of diffuse multiple myeloma involvement throughout the thoracolumbar spine and multiple ribs bilaterally, with areas of present clinical relevance described below. 6. Compression fracture deformity of the T12  vertebral body, known pathologic fracture, status post treatment with vertebroplasty on 04/19/2015. 7. Milder compression deformity of the overlying T11 vertebral body, approximately 40% compressed anteriorly, also presumably pathologic. This vertebral body appeared essentially normal on fluoroscopic images from the earlier aforementioned vertebroplasty of 04/19/2015. 8. Slight irregularity of the right lateral sixth rib, with subtle cortical sclerosis suggesting nondisplaced healing fracture. This may be a relatively acute fracture and is probably pathologic in nature related to underlying multiple myeloma. 9. Similar irregularity of the left lateral fifth rib, suggesting minimally displaced healing fracture. This may also be a relatively acute fracture and is likely pathologic in nature related to underlying multiple myeloma. These acute or subacute rib fractures are the most likely source of patient's bilateral chest pain. Additional incidental findings in the upper abdomen: Fatty infiltration of liver, cholelithiasis without evidence of acute cholecystitis, fairly large amount of stool within the nondistended colon of the upper abdomen (constipation? ). Electronically Signed   By: Franki Cabot M.D.   On: 09/30/2015 13:23    Labs:  CBC:  Recent Labs  09/16/15 1002 09/24/15 1155 09/30/15 0935 10/11/15 1022  WBC 6.4 8.9 9.0 7.4  HGB 13.0 13.1 12.9* 15.0  HCT 38.3* 40.0 39.3 43.9  PLT 130 with few platelet clumps* 141 163 209    COAGS:  Recent Labs  04/17/15 0509 04/27/15 0138 04/27/15 0406 05/01/15 0427  INR 1.01 1.06 1.05 1.03  APTT  --  28  --   --     BMP:  Recent Labs  04/28/15 0514 04/29/15 0427 04/30/15 0605 05/01/15 0427  09/12/15 1040 09/16/15 1003 09/24/15 1155 09/30/15 1108  NA 132* 134* 136 137  < > 137 136 139 138  K 3.7 3.5 3.0* 3.7  < > 4.1 3.6 3.2* 3.5  CL 105 106 112* 110  --   --   --   --   --   CO2 22 23 20* 21*  < > 19* 22 25 21*  GLUCOSE 110* 111*  106* 107*  < > 114 121 102 103  BUN '18 14 9 8  ' < > 4.3* 7.5 9.1 5.8*  CALCIUM 9.3 9.1 9.1 9.2  < > 8.8 9.3 8.8 9.0  CREATININE 1.79* 1.78* 1.32* 1.34*  < > 1.0 1.1 1.2 1.2  GFRNONAA 38* 38* 55* 54*  --   --   --   --   --  GFRAA 44* 44* >60 >60  --   --   --   --   --   < > = values in this interval not displayed.  LIVER FUNCTION TESTS:  Recent Labs  09/12/15 1040 09/16/15 1003 09/24/15 1155 09/30/15 1108  BILITOT 0.72 0.79 0.58 0.42  AST 37* '28 26 30  ' ALT 42 32 25 25  ALKPHOS 172* 160* 162* 178*  PROT 5.7* 5.8* 5.2* 5.3*  ALBUMIN 3.2* 3.4* 3.1* 3.1*    TUMOR MARKERS: No results for input(s): AFPTM, CEA, CA199, CHROMGRNA in the last 8760 hours.  Assessment and Plan:  Mr. Los has done well from the standpoint of his T12 kyphoplasty. He has suffered a new mild compression deformity at T11 which is not significantly symptomatic at this time. He was instructed to contact us if back pain increases.    Signed: Caren Garske, ART A 10/23/2015, 9:19 AM   I spent a total of   25 Minutes in face to face in clinical consultation, greater than 50% of which was counseling/coordinating care for T11 compression deformity

## 2015-10-31 ENCOUNTER — Other Ambulatory Visit: Payer: Medicare Other

## 2015-10-31 ENCOUNTER — Ambulatory Visit: Payer: Medicare Other | Admitting: Hematology

## 2015-11-10 ENCOUNTER — Other Ambulatory Visit: Payer: Self-pay | Admitting: Hematology

## 2015-11-11 ENCOUNTER — Ambulatory Visit (HOSPITAL_BASED_OUTPATIENT_CLINIC_OR_DEPARTMENT_OTHER): Payer: Medicare Other

## 2015-11-11 ENCOUNTER — Other Ambulatory Visit: Payer: Self-pay | Admitting: *Deleted

## 2015-11-11 ENCOUNTER — Ambulatory Visit (HOSPITAL_BASED_OUTPATIENT_CLINIC_OR_DEPARTMENT_OTHER): Payer: Medicare Other | Admitting: Hematology

## 2015-11-11 ENCOUNTER — Telehealth: Payer: Self-pay | Admitting: Hematology

## 2015-11-11 ENCOUNTER — Other Ambulatory Visit (HOSPITAL_BASED_OUTPATIENT_CLINIC_OR_DEPARTMENT_OTHER): Payer: Medicare Other

## 2015-11-11 ENCOUNTER — Other Ambulatory Visit: Payer: Medicare Other

## 2015-11-11 ENCOUNTER — Encounter: Payer: Self-pay | Admitting: Hematology

## 2015-11-11 VITALS — BP 156/81 | HR 113 | Temp 97.4°F | Resp 20 | Ht 68.5 in | Wt 153.3 lb

## 2015-11-11 DIAGNOSIS — C9000 Multiple myeloma not having achieved remission: Secondary | ICD-10-CM | POA: Diagnosis not present

## 2015-11-11 DIAGNOSIS — R530 Neoplastic (malignant) related fatigue: Secondary | ICD-10-CM | POA: Diagnosis not present

## 2015-11-11 DIAGNOSIS — R630 Anorexia: Secondary | ICD-10-CM | POA: Diagnosis not present

## 2015-11-11 DIAGNOSIS — G893 Neoplasm related pain (acute) (chronic): Secondary | ICD-10-CM | POA: Diagnosis not present

## 2015-11-11 DIAGNOSIS — M549 Dorsalgia, unspecified: Secondary | ICD-10-CM

## 2015-11-11 DIAGNOSIS — C099 Malignant neoplasm of tonsil, unspecified: Secondary | ICD-10-CM

## 2015-11-11 LAB — CBC & DIFF AND RETIC
BASO%: 0.5 % (ref 0.0–2.0)
BASOS ABS: 0 10*3/uL (ref 0.0–0.1)
EOS%: 4.3 % (ref 0.0–7.0)
Eosinophils Absolute: 0.2 10*3/uL (ref 0.0–0.5)
HEMATOCRIT: 42 % (ref 38.4–49.9)
HGB: 14.3 g/dL (ref 13.0–17.1)
IMMATURE RETIC FRACT: 11.3 % — AB (ref 3.00–10.60)
LYMPH#: 0.5 10*3/uL — AB (ref 0.9–3.3)
LYMPH%: 10.2 % — ABNORMAL LOW (ref 14.0–49.0)
MCH: 36.8 pg — ABNORMAL HIGH (ref 27.2–33.4)
MCHC: 34 g/dL (ref 32.0–36.0)
MCV: 108 fL — ABNORMAL HIGH (ref 79.3–98.0)
MONO#: 0.7 10*3/uL (ref 0.1–0.9)
MONO%: 15.4 % — ABNORMAL HIGH (ref 0.0–14.0)
NEUT#: 3.1 10*3/uL (ref 1.5–6.5)
NEUT%: 69.6 % (ref 39.0–75.0)
Platelets: 255 10*3/uL (ref 140–400)
RBC: 3.89 10*6/uL — AB (ref 4.20–5.82)
RDW: 14.1 % (ref 11.0–14.6)
RETIC %: 2.06 % — AB (ref 0.80–1.80)
RETIC CT ABS: 80.13 10*3/uL (ref 34.80–93.90)
WBC: 4.4 10*3/uL (ref 4.0–10.3)

## 2015-11-11 LAB — COMPREHENSIVE METABOLIC PANEL (CC13)
ALK PHOS: 119 U/L (ref 40–150)
ALT: 89 U/L — AB (ref 0–55)
AST: 96 U/L — AB (ref 5–34)
Albumin: 3.2 g/dL — ABNORMAL LOW (ref 3.5–5.0)
Anion Gap: 8 mEq/L (ref 3–11)
BUN: 5.6 mg/dL — AB (ref 7.0–26.0)
CALCIUM: 8.9 mg/dL (ref 8.4–10.4)
CO2: 18 mEq/L — ABNORMAL LOW (ref 22–29)
CREATININE: 0.8 mg/dL (ref 0.7–1.3)
Chloride: 113 mEq/L — ABNORMAL HIGH (ref 98–109)
EGFR: >90 mL/min/{1.73_m2} (ref >90)
Glucose: 124 mg/dl (ref 70–140)
Potassium: 3.6 mEq/L (ref 3.5–5.1)
Sodium: 139 mEq/L (ref 136–145)
TOTAL PROTEIN: 5.6 g/dL — AB (ref 6.4–8.3)

## 2015-11-11 MED ORDER — CYCLOBENZAPRINE HCL 5 MG PO TABS: 5.0000 mg | ORAL_TABLET | Freq: Three times a day (TID) | ORAL | Status: DC

## 2015-11-11 MED ORDER — LIDOCAINE 5 % EX PTCH: 1.0000 | MEDICATED_PATCH | CUTANEOUS | Status: DC

## 2015-11-11 MED ORDER — SODIUM CHLORIDE 0.9 % IV SOLN
Freq: Once | INTRAVENOUS | Status: AC
  Administered 2015-11-11: 12:00:00 via INTRAVENOUS

## 2015-11-11 MED ORDER — ZOLEDRONIC ACID 4 MG/100ML IV SOLN
4.0000 mg | Freq: Once | INTRAVENOUS | Status: AC
  Administered 2015-11-11: 4 mg via INTRAVENOUS
  Filled 2015-11-11: qty 100

## 2015-11-11 NOTE — Telephone Encounter (Signed)
per pof to sch pt appt-gave pt copy of avs °

## 2015-11-11 NOTE — Progress Notes (Signed)
[place]  [phone] [phone]  Clinic Follow Up Note   Patient Care Team: [name], MD as PCP - General (Internal Medicine) Pixie Casino, MD as Consulting Physician (Cardiology) Truitt Merle, MD as Consulting Physician (Hematology) 11/11/2015   CHIEF COMPLAINTS: Follow-up multiple myeloma    Multiple myeloma ([place])   01/31/2015 Tumor Marker SPEP showed M-SPIKE 1.7g/dl, Cr 1.5, no anemia, hypercalcemia   03/19/2015 Imaging Bone survey showed no discrete lytic lesion, but there is osteopenia and calvarial heterogeneity.   03/27/2015 Initial Diagnosis Multiple myeloma, stage I   03/27/2015 Bone Marrow Biopsy Bone Marrow, Aspirate,Biopsy: - HYPERCELLULAR BONE MARROW FOR AGE WITH PLASMA CELL NEOPLASM 47% - SEE COMMENT. PERIPHERAL BLOOD: - NO SIGNIFICANT MORPHOLOGIC ABNORMALITIES. Diagnosis Note The bone marrow shows increased number of    03/27/2015 Miscellaneous bone marrow Cytogenetics: normal. FISH panel showed the presence of +4 and +11, this is consider standard risk of MM    04/16/2015 - 04/23/2015 Hospital Admission He was admitted for worsening back pain, was found to have a T12 pathological fracture. He underwent kyphoplasty on 04/19/2015.   04/19/2015 - 08/02/2015 Chemotherapy RVD with weekly Velcade 1.3 mg/m, dexamethasone 40 mg, and Revlimid 10 mg daily on day 1-21, every 28 days, cycle 1 Revlimid was interupted by hospitalization, cycle 3 Revlimid dose increased to 25 mg daily due to his improved renal function. total 4 cyc   04/26/2015 - 05/01/2015 Hospital Admission He was admitted to [facility] for syncope episode during his radiation simulation. EKG showed ST elevation, troponin was positive, he underwent cardio catheterization which showed mild stenosis. No intervention was needed. echo showed EF 35-40%   04/29/2015 - 05/10/2015 Radiation Therapy palliative RT to T12, 20Gy in 10 fractions   08/19/2015 Bone Marrow Transplant He underwent  autologous stem cell transplant at [facility].      HISTORY OF PRESENTING ILLNESS (03/15/2015):  Eric Dudley 64 y.o. 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  He has been having low back pain for 3-4 months. He had food posinin 4 month ago, and had frequent diarrhea. He started noticed low back pain since then. The pain is not radiating to leg, he also has intermittent right rib pain, worse with cough and sneezing. He remains to be physically active, able to do all the activities, such as gardening work housework without much limitation, but he doesn't sings slowly because of back pain. He takes Tylenol as needed, does not like the necrotic pain medication. No night sweats, no fever or chillss, no weight loss.   He was seen by his primary care physician Dr. [name], MD and had lab test (see below). He also had bone density scan 2/25 which showed osteoporosis, has not been treated yet.  Current therapy: observation   INTERIM HISTORY: [name] returns for follow-up. He is accompanied by his wife to the clinic today. He has been doing much better lately. His pain has significantly improved, he is off any pain medication, occasional use the lidocaine path, she is stable to ambulatory without much difficulty. His appetite has back to normal, he is very well now. He denies any fever, or other new symptoms.  MEDICAL HISTORY:  Past Medical History  Diagnosis Date  . Hypertension   . Dyslipidemia   . 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 Cholelithiases   . Hyperlipidemia   .  Chronic kidney disease     MILD,CHRONIC    SURGICAL HISTORY: Past Surgical History  Procedure Laterality Date  . Appendectomy  1962  . Shoulder surgery  1999    left  . Knee surgery  2012    right  . Cardiac catheterization N/A 04/30/2015    Procedure: Left Heart Cath and  Coronary Angiography;  Surgeon: Peter M Martinique, MD;  Location: [facility] INVASIVE CV LAB CUPID;  Service: Cardiovascular;  Laterality: N/A;  . Cardiolite myocardial perfusion study  04/16/03    NEGITIVE BRUCE PROTOCAL EXERCISE STRESS TEST. EF 70%. NO ISCHEMIA.    SOCIAL HISTORY: History   Social History  . Marital Status: Married    Spouse Name: N/A  . Number of Children: 1   . Years of Education: N/A   Occupational History  . A retired Therapist, occupational    Social History Main Topics  . Smoking status: Former Smoker    Types: Cigars    Quit date: 12/29/2003  . Smokeless tobacco: Former Systems developer    Types: Chew    Quit date: 12/28/1994  . Alcohol Use: Yes     Comment: occasional beer  . Drug Use: No  . Sexual Activity: Not on file   Other Topics Concern  . Not on file   Social History Narrative  . No narrative on file    FAMILY HISTORY: Family History  Problem Relation Age of Onset  . Cancer Mother 3    cervical cancer   . Cancer Father     unknown cancer   . Hypertension Sister   . Cancer Sister     melanoma     ALLERGIES:  is allergic to hydrochlorothiazide; asa; calcium carbonate; oxycodone; amoxicillin; and indapamide.  MEDICATIONS:  Current Outpatient Prescriptions  Medication Sig Dispense Refill  . acyclovir (ZOVIRAX) 400 MG tablet Take 1 tablet (400 mg total) by mouth 2 (two) times daily. 180 tablet 1  . aspirin 81 MG chewable tablet Chew 1 tablet (81 mg total) by mouth daily.    . calcium carbonate (OS-CAL) 600 MG TABS tablet Take 1,200 mg by mouth daily with breakfast.    . clopidogrel (PLAVIX) 75 MG tablet Take 1 tablet (75 mg total) by mouth daily. 90 tablet 3  . ezetimibe-simvastatin (VYTORIN) 10-40 MG per tablet Take 1 tablet by mouth at bedtime.    . folic acid (FOLVITE) 1 MG tablet Take 1 mg by mouth daily. Takes for 6 months.    . metoCLOPramide (REGLAN) 10 MG tablet Take 10 mg by mouth 4 (four) times daily.    . midodrine (PROAMATINE) 10 MG tablet Take 1  tablet (10 mg total) by mouth 3 (three) times daily. 90 tablet 6  . Multiple Vitamins-Minerals (CENTRUM SILVER ULTRA MENS PO) Take 1 tablet by mouth daily.    . ondansetron (ZOFRAN) 4 MG tablet Take 1 tablet (4 mg total) by mouth 4 (four) times daily as needed for nausea or vomiting. 40 tablet 2  . potassium chloride (MICRO-K) 10 MEQ CR capsule Take 20 mEq by mouth daily.    . prochlorperazine (COMPAZINE) 10 MG tablet Take 10 mg by mouth every 6 (six) hours as needed.     . ranitidine (ZANTAC) 150 MG tablet Take 2 tablets (300 mg total) by mouth at bedtime. 60 tablet 1  . sulfamethoxazole-trimethoprim (BACTRIM DS,SEPTRA DS) 800-160 MG per tablet Take 1 tablet by mouth as directed. Take 1 tablet every   Mon -  Wed  -  Fri  Begin day  30   (  9/26  Through   02/24/16 )  6  Months.    . traMADol (ULTRAM) 50 MG tablet Take 2 tablets (100 mg total) by mouth every 6 (six) hours as needed. 60 tablet 1  . cyclobenzaprine (FLEXERIL) 5 MG tablet Take 1 tablet (5 mg total) by mouth 3 (three) times daily. 90 tablet 1  . lidocaine (LIDODERM) 5 % Place 1 patch onto the skin daily. Remove & Discard patch within 12 hours or as directed by MD 30 patch 1   No current facility-administered medications for this visit.    REVIEW OF SYSTEMS:   Constitutional: Denies fevers, chills or abnormal night sweats Eyes: Denies blurriness of vision, double vision or watery eyes Ears, nose, mouth, throat, and face: Denies mucositis or sore throat Respiratory: Denies cough, dyspnea or wheezes Cardiovascular: Denies palpitation, chest discomfort or lower extremity swelling Gastrointestinal:  Denies nausea, heartburn or change in bowel habits Skin: Denies abnormal skin rashes Lymphatics: Denies new lymphadenopathy or easy bruising Neurological:Denies numbness, tingling or new weaknesses Behavioral/Psych: Mood is stable, no new changes  Musculoskeletal: Positive for back pain and intermittent rib pain All other systems were  reviewed with the patient and are negative.  PHYSICAL EXAMINATION: ECOG PERFORMANCE STATUS: 1 BP 156/81 mmHg  Pulse 113  Temp(Src) 97.4 F (36.3 C) (Oral)  Resp 20  Ht 5' 8.5" (1.74 m)  Wt 153 lb 4.8 oz (69.536 kg)  BMI 22.97 kg/m2  SpO2 99%  GENERAL: He comes in today with a wheelchair, alert and oriented, no distress, chronically ill-appearing SKIN: skin color, texture, turgor are normal, no rashes or significant lesions except to some healing rash on his hands EYES: normal, conjunctiva are pink and non-injected, sclera clear OROPHARYNX:no exudate, no erythema and lips, buccal mucosa, and tongue normal  NECK: supple, thyroid normal size, non-tender, without nodularity LYMPH:  no palpable lymphadenopathy in the cervical, axillary or inguinal LUNGS: clear to auscultation and percussion with normal breathing effort HEART: regular rate & rhythm and no murmurs and no lower extremity edema ABDOMEN:abdomen soft, non-tender and normal bowel sounds Musculoskeletal:no cyanosis of digits and no clubbing, mild tenderness on bilateral lateral chest wall  PSYCH: alert & oriented x 3 with fluent speech NEURO: no focal motor/sensory deficits  LABORATORY DATA:   CBC Latest Ref Rng 11/11/2015 10/11/2015 09/30/2015  WBC 4.0 - 10.3 10e3/uL 4.4 7.4 9.0  Hemoglobin 13.0 - 17.1 g/dL 14.3 15.0 12.9(L)  Hematocrit 38.4 - 49.9 % 42.0 43.9 39.3  Platelets 140 - 400 10e3/uL 255 209 163   CMP Latest Ref Rng 11/11/2015 09/30/2015 09/24/2015  Glucose 70 - 140 mg/dl 124 103 102  BUN 7.0 - 26.0 mg/dL 5.6(L) 5.8(L) 9.1  Creatinine 0.7 - 1.3 mg/dL 0.8 1.2 1.2  Sodium 136 - 145 mEq/L 139 138 139  Potassium 3.5 - 5.1 mEq/L 3.6 3.5 3.2(L)  Chloride 101 - 111 mmol/L - - -  CO2 22 - 29 mEq/L 18(L) 21(L) 25  Calcium 8.4 - 10.4 mg/dL 8.9 9.0 8.8  Total Protein 6.4 - 8.3 g/dL 5.6(L) 5.3(L) 5.2(L)  Total Bilirubin 0.20 - 1.20 mg/dL <0.30 0.42 0.58  Alkaline Phos 40 - 150 U/L 119 178(H) 162(H)  AST 5 - 34 U/L 96(H)  30 26  ALT 0 - 55 U/L 89(H) 25 25   MM lab: SPEP M-protein (g/dl) 03/15/2015: 1.6 05/24/2015: 0.5 06/21/2015: 0.4 07/18/2015 (at [facility]): 0.33  Serum IgG (mg/dl) 03/15/2015: 2000 05/24/2015: 579 06/21/2015: 673 07/18/2015 ([facility]): 509  Serum kappa/lamda/ratio 03/15/2015: 690, 0.13, 5308 05/24/2015: 146, 1.27, 115.0 06/21/2015: 86, 0.74, 116.2  24 h urine kappa light chain (mg) 03/18/2015: 2434 05/28/2015: 582 06/24/2015: 322   PATHOLOGY REPORT Bone Marrow, Aspirate,Biopsy, and Clot, right iliac 03/27/2015 - HYPERCELLULAR BONE MARROW FOR AGE WITH PLASMA CELL NEOPLASM. - SEE COMMENT. PERIPHERAL BLOOD: - NO SIGNIFICANT MORPHOLOGIC ABNORMALITIES. Diagnosis Note The bone marrow shows increased number of atypical plasma cells representing 47% of all cells in the aspirate associated with prominent interstitial infiltrates and variably sized aggregates in the clot and biopsy sections. Immunohistochemical stains show that the plasma cells are kappa light chain restricted consistent with plasma cell neoplasm. The background shows trilineage  Cytogenetics: normal FISH panel: +4 and +11  RADIOGRAPHIC STUDIES: CT anginal chest 09/30/2015 IMPRESSION: 1. No pulmonary embolism. 2. No thoracic aortic aneurysm or dissection. 3. Heart size is normal. No pericardial effusion. 4. Mild scarring/atelectasis at each lung base, probably chronic. Lungs otherwise clear. No evidence of pneumonia. No pleural effusion. 5. Evidence of diffuse multiple myeloma involvement throughout the thoracolumbar spine and multiple ribs bilaterally, with areas of present clinical relevance described below. 6. Compression fracture deformity of the T12 vertebral body, known pathologic fracture, status post treatment with vertebroplasty on 04/19/2015. 7. Milder compression deformity of the overlying T11 vertebral body, approximately 40% compressed anteriorly, also presumably pathologic. This vertebral body appeared  essentially normal on fluoroscopic images from the earlier aforementioned vertebroplasty of 04/19/2015. 8. Slight irregularity of the right lateral sixth rib, with subtle cortical sclerosis suggesting nondisplaced healing fracture. This may be a relatively acute fracture and is probably pathologic in nature related to underlying multiple myeloma. 9. Similar irregularity of the left lateral fifth rib, suggesting minimally displaced healing fracture. This may also be a relatively acute fracture and is likely pathologic in nature related to underlying multiple myeloma. These acute or subacute rib fractures are the most likely source of patient's bilateral chest pain. Additional incidental findings in the upper abdomen: Fatty infiltration of liver, cholelithiasis without evidence of acute cholecystitis, fairly large amount of stool within the nondistended colon of the upper abdomen (constipation? ).   ASSESSMENT & PLAN:  64 year old Caucasian male with past medical history of hypertension, tonsil cancer status post surgery and radiation 10 years ago, now presented with 3-4 months low back pain and intermittent rib pain. His blood test showed M protein 1.7g/dl.  1. IgG multiple myeloma, stage I, standard risk by cytogenetics (+4 and +11) -I discussed his bone marrow biopsy results with him. He has significant amount of plasma cells in the bone marrow 47%.  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 Based on the new notable myeloma diagnostic criteria, he meets the criteria of multiple myeloma. He has normal albumin and miildly elevated beta 2 microglobulin, this is stage I. -His cytogenetics and Fish panel revealed standard risk. -His thoracic  and lumbar spine MRI showed pathologic compression fracture at T12, focal myeloma deposits at L3 and T9, diffuse marrow signal abnormality consistent with myeloma -I recommended induction chemotherapy with VRD regimen, weekly Velcade and dexamethasone, first 2 cycles Revlimid dose reduced to 10 mg daily 3 week on, one-week off, based on his renal function, and it which was increased to full dose from cycle 3. He received a total of 4  cycles of treatment, achieved a very good partial response. Repeat bone marrow on 07/18/2015 showed 5% plasma cells, fish and cytogenetics was normal. -He now has recovered well from bone marrow transplant. He is going to see Dr. Norma Fredrickson at [facility] in 2 weeks for a post transplant 100 day follow-up. He will repeat lab, bone marrow biopsy, and PET scan on the visit. -Continue supportive care now -We briefly reviewed Revlimid maintenance therapy after transplant. This will be determined on his 100 day post transplant visit.  2. Orthostatic hypotension and presyncope -resolved recently, his BP is normal  3. T12 pathologic compression fracture, and diffuse myeloma involvement in bones -Status post kyphoplasty and palliative radiation  -He received palliative Radiation, pain much improved  -continue pamidronate every 4 weeks, last dose 7/22, will resume. His renal function are normal lately, I changed to zometa infusion, and will continue monthly  4. Back and rib pain, secondary to multiple myeloma bone lesions  -much improved  -Continue tylenol, tramadol 100 mg as needed and lidocaine patch   5 nonobstructive CAD, STEMI, Non ischemic CM, Takatsubo, with EF of 35-40% -Patient was initially found to have troponin elevation and anterior apical ST elevation MI and resultant cardiomyopathy on 04/26/15 -He underwent a cardiac catheterization on 5/3 which showed mild diffuse disease, no stents were placed. He is on aspirin -follow up with Dr. Debara Pickett next week  -Repeat echo  before transplant showed EF 55-60%  6. Fatigue and anorexia -much improved now  7. Advanced directives - He brought a copy of his advanced directives, we scaned it into EPIC -he is full code.   Plan -Zometa infusion today and monthly  -I'll see him back in 1 months for follow-up. -he will see Dr. Norma Fredrickson at the [facility] in 2 weeks  All questions were answered. The patient knows to call the clinic with any problems, questions or concerns.  I spent 20 minutes counseling the patient face to face. The total time spent in the appointment was 30 minutes and more than 50% was on counseling.     Truitt Merle, MD 11/11/2015

## 2015-11-11 NOTE — Patient Instructions (Signed)

## 2015-11-13 LAB — IMMUNOFIXATION ELECTROPHORESIS
IGG (IMMUNOGLOBIN G), SERUM: 323 mg/dL — AB (ref 650–1600)
IGM, SERUM: 12 mg/dL — AB (ref 41–251)
TOTAL PROTEIN, SERUM ELECTROPHOR: 5.3 g/dL — AB (ref 6.0–8.3)

## 2015-11-13 LAB — KAPPA/LAMBDA LIGHT CHAINS
KAPPA LAMBDA RATIO: 2.65 — AB (ref 0.26–1.65)
Kappa free light chain: 0.9 mg/dL (ref 0.33–1.94)
LAMBDA FREE LGHT CHN: 0.34 mg/dL — AB (ref 0.57–2.63)

## 2015-11-18 ENCOUNTER — Encounter: Payer: Self-pay | Admitting: Internal Medicine

## 2015-11-18 ENCOUNTER — Ambulatory Visit (INDEPENDENT_AMBULATORY_CARE_PROVIDER_SITE_OTHER): Payer: Medicare Other | Admitting: Internal Medicine

## 2015-11-18 VITALS — BP 130/86 | HR 80 | Ht 68.5 in | Wt 150.3 lb

## 2015-11-18 DIAGNOSIS — I499 Cardiac arrhythmia, unspecified: Secondary | ICD-10-CM

## 2015-11-18 DIAGNOSIS — I951 Orthostatic hypotension: Secondary | ICD-10-CM | POA: Diagnosis not present

## 2015-11-18 DIAGNOSIS — I429 Cardiomyopathy, unspecified: Secondary | ICD-10-CM

## 2015-11-18 DIAGNOSIS — I428 Other cardiomyopathies: Secondary | ICD-10-CM

## 2015-11-18 DIAGNOSIS — I519 Heart disease, unspecified: Secondary | ICD-10-CM

## 2015-11-18 MED ORDER — MIDODRINE HCL 10 MG PO TABS: 10.0000 mg | ORAL_TABLET | Freq: Three times a day (TID) | ORAL | Status: DC

## 2015-11-18 NOTE — Progress Notes (Signed)
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 He has been on Vytorin 10/40 mg. Recently, a lipid NMR demonstrated a high particle number at 1323 with an LDL cholesterol of 67, indicating increased risk. He also underwent a [place] heart protocol per your request, which showed an elevated lipoprotein(a) at 155 and elevated insulin level, suggesting early insulin resistance. Hemoglobin was 5.8. APOB levels were mildly elevated as well as APOA1 fractions. However, overall cholesterol control is fairly good. In the past, I had recommended changing him to Crestor; however, apparently he was intolerant to this in the past as well as Lipitor and another statin, which he does not recall. He is on low-dose Diovan for hypertension and had been on Norvasc in the past. The Diovan was apparently started by Dr. Einar Gip prior to my caring for him and I feel that this is a reasonable medicine for blood pressure control. Symptomatically, he denies any chest pain, shortness of breath, palpitations, presyncope, or syncopal symptoms.   I the pleasure see Mr. [name] back in the office today. He was recently hospitalized after presenting with a syncopal event. He was admitted on 4/19 for acute back pain where he was found to have a pathological T12 fracture IR consulted and recommended kyphoplasty which was performed on 04/19/15.Patient came to his regular scheduled injection of Velcade at the cancer center 4/29 and was being mapped for XRT and while being transferred from chair to table he was dropped down below usual 30. Subsequent to this he had an episode wherein he had severe pain and became unresponsive for about 20 seconds. EKG demonstrated  anteroapical ST elevation and he had car catheterization on 04/30/2015. This demonstratedProx RCA lesion, 30% stenosed. Ost LM to LM lesion, 30% stenosed, normal LVEDP. Echocardiography, however demonstrated reduced LV function with an EF of 35-40% and a large anteroapical aneurysm. The etiology of this aneurysm is not quite clear but thought to be related to prior radiation therapy and or chemotherapy for tonsillar cancer. He did suffer an NSTEMI, with troponin elevation to greater than 4. He was placed on aspirin and appropriately Plavix. There was some question as to whether he needed to continue this, however based on the CURE trial data, there is indication for therapy at least one year even in patients who did not have prior stenting. There is also new are data using Brilinta with similar if not better outcome data. Currently he reports some fatigue and had an episode yesterday where he vomited number of times and is somewhat weak today. Blood pressure is low normal.  Mr. Blades returns today for follow-up. He has been diagnosed with multiple myeloma and recently underwent bone marrow transplant. During that time he had congestive heart failure exacerbation. Prior to transplant his EF has improved and in fact a repeat echocardiogram performed on 9 7 2016 demonstrated an EF of 65-70%.  He's not currently on heart failure medication other than low-dose metoprolol XL 12.5 mg daily due to hypotension.  He is also on low-dose aspirin 81 mg and Plavix 75 mg.   As a reminder, there is no significant obstructive disease by cath in April and the dual antiplatelets therapy is for non-STEMI..  [name] was seen back in the office today  for follow-up. He continues to have symptoms of lower orthostatic hypotension. Orthostatic vitals today indicated a drop of blood pressure by 30 mmHg. He's also noted to be tachycardic. He was dizzy with change in position. As last office visit I stopped his Aldactone which he says  has not made a significant difference in his symptoms. He is on low-dose beta blocker but remains tachycardic. I suspect this may be related to either multiple myeloma or his recent stem cell transplant. He recently had repeat echocardiogram as described above which showed improvement in LV function.  [name] reports a marked improvement in his orthostatic symptoms on midodrine. We have discontinued all of blood pressure medicines however for some reason he received valsartan in the mail. Fortunately he did not take this medicine. In generally very pleased with the midodrine and its improvement in his blood pressures. He is now able to do most activities and seems to be improving. The etiology of his low blood pressures is not quite clear. I would like to check a repeat limited echo to look for changes in LV function.  PMHx:  Past Medical History  Diagnosis Date  . Hypertension   . Dyslipidemia   . Tonsillar cancer ([place]) 2006  . Cholelithiases   . Hyperlipidemia   . Chronic kidney disease     [place]    Past Surgical History  Procedure Laterality Date  . Appendectomy  1962  . Shoulder surgery  1999    left  . Knee surgery  2012    right  . Cardiac catheterization N/A 04/30/2015    Procedure: Left Heart Cath and Coronary Angiography;  Surgeon: Peter M Martinique, MD;  Location: [facility] INVASIVE CV LAB CUPID;  Service: Cardiovascular;  Laterality: N/A;  . Cardiolite myocardial perfusion study  04/16/03    NEGITIVE BRUCE PROTOCAL EXERCISE STRESS TEST. EF 70%. NO ISCHEMIA.    FAMHx:  Family History  Problem Relation Age of Onset  . Cancer Mother 59    cervical cancer   . Cancer Father     unknown cancer   . Hypertension Sister   . Cancer Sister     melanoma     SOCHx:   reports that he quit smoking about 11 years ago. His smoking use included Cigars. He quit smokeless tobacco use about 20 years ago. His smokeless tobacco use included Chew. He reports that he drinks alcohol. He  reports that he does not use illicit drugs.  ALLERGIES:  Allergies  Allergen Reactions  . Hydrochlorothiazide Rash and Palpitations  . Asa [Aspirin] Nausea Only  . Calcium Carbonate Nausea And Vomiting  . Oxycodone Other (See Comments)    Patient does not want to take. Severe Hallucinations.  . Amoxicillin Itching and Rash  . Indapamide Palpitations    Lightheadedness, dizziness    ROS: A comprehensive review of systems was negative.  HOME MEDS: Current Outpatient Prescriptions  Medication Sig Dispense Refill  . acyclovir (ZOVIRAX) 400 MG tablet Take 1 tablet (400 mg total) by mouth 2 (two) times daily. 180 tablet 1  . aspirin 81 MG chewable tablet Chew 1 tablet (81 mg total) by mouth daily.    . calcium carbonate (OS-CAL) 600 MG TABS tablet Take 1,200 mg by mouth daily with breakfast.    . clopidogrel (PLAVIX) 75 MG tablet Take 1 tablet (75 mg total) by mouth daily. 90 tablet 3  . cyclobenzaprine (FLEXERIL) 5 MG tablet Take 1 tablet (5 mg total) by mouth 3 (three) times daily. 90 tablet  1  . diphenhydrAMINE (BENADRYL) 25 mg capsule Take 25 mg by mouth at bedtime as needed.    . ezetimibe-simvastatin (VYTORIN) 10-40 MG per tablet Take 1 tablet by mouth at bedtime.    . folic acid (FOLVITE) 1 MG tablet Take 1 mg by mouth daily. Takes for 6 months.    . lidocaine (LIDODERM) 5 % Place 1 patch onto the skin daily. Remove & Discard patch within 12 hours or as directed by MD 30 patch 1  . loperamide (IMODIUM) 2 MG capsule Take 2 mg by mouth 4 (four) times daily as needed.    . metoCLOPramide (REGLAN) 10 MG tablet Take 10 mg by mouth 4 (four) times daily.    . midodrine (PROAMATINE) 10 MG tablet Take 1 tablet (10 mg total) by mouth 3 (three) times daily. 270 tablet 3  . Multiple Vitamins-Minerals (CENTRUM SILVER ULTRA MENS PO) Take 1 tablet by mouth daily.    . nitroGLYCERIN (NITROSTAT) 0.4 MG SL tablet Place 0.4 mg under the tongue every 5 (five) minutes x 3 doses as needed.    .  ondansetron (ZOFRAN) 4 MG tablet Take 1 tablet (4 mg total) by mouth 4 (four) times daily as needed for nausea or vomiting. 40 tablet 2  . potassium chloride (MICRO-K) 10 MEQ CR capsule Take 20 mEq by mouth daily.    . prochlorperazine (COMPAZINE) 10 MG tablet Take 10 mg by mouth every 6 (six) hours as needed.     . ranitidine (ZANTAC) 150 MG tablet Take 2 tablets (300 mg total) by mouth at bedtime. 60 tablet 1  . sulfamethoxazole-trimethoprim (BACTRIM DS,SEPTRA DS) 800-160 MG per tablet Take 1 tablet by mouth as directed. Take 1 tablet every   Mon -  Wed  -  Fri   Begin day  30   (  9/26  Through   02/24/16 )  6  Months.    . traMADol (ULTRAM) 50 MG tablet Take 2 tablets (100 mg total) by mouth every 6 (six) hours as needed. 60 tablet 1   No current facility-administered medications for this visit.    LABS/IMAGING: No results found for this or any previous visit (from the past 48 hour(s)). No results found.  VITALS: BP 130/86 mmHg  Pulse 80  Ht 5' 8.5" (1.74 m)  Wt 150 lb 4.8 oz (68.176 kg)  BMI 22.52 kg/m2  EXAM: Deferred  EKG: Deferred  ASSESSMENT: 1. Nonischemic cardiomyopathy, EF 35-40% - improved to 65-70% by echo at [facility] on 09/04/15 2. 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 Hypertension 4. Dyslipidemia 5. History of tonsillar cancer 6. Multiple myeloma-s/p BMT 7. CKD2 8. Orthostatic hypotension with syncope - improved on midodrine  PLAN: 1.   Mr. Sharief Wainwright to be improving. His heart rate is lower today at 80, but he's no longer on beta blocker. His blood pressure is improved on midodrine and has had no further presyncopal or syncopal episodes. We'll plan to continue this medicine for the time being unless his blood pressure becomes persistently hypertensive, which case we could consider weaning it back. Plan to see him back in 6 months.  Pixie Casino, MD, [facility] Attending Cardiologist [facility]  KRISTOF NADEEM 11/18/2015, 1:39  PM

## 2015-11-18 NOTE — Patient Instructions (Addendum)
Your physician recommends that you continue on your current medications as directed. Please refer to the Current Medication list given to you today.  Please schedule a limited echocardiogram - [address] wants you to follow-up in: 6 months with Dr. Debara Pickett. You will receive a reminder letter in the mail two months in advance. If you don't receive a letter, please call our office to schedule the follow-up appointment.

## 2015-11-27 DIAGNOSIS — I2510 Atherosclerotic heart disease of native coronary artery without angina pectoris: Secondary | ICD-10-CM | POA: Diagnosis not present

## 2015-11-27 DIAGNOSIS — I700 Atherosclerosis of aorta: Secondary | ICD-10-CM | POA: Diagnosis not present

## 2015-11-27 DIAGNOSIS — Z9484 Stem cells transplant status: Secondary | ICD-10-CM | POA: Diagnosis not present

## 2015-11-27 DIAGNOSIS — C9000 Multiple myeloma not having achieved remission: Secondary | ICD-10-CM | POA: Diagnosis not present

## 2015-11-27 DIAGNOSIS — R9349 Abnormal radiologic findings on diagnostic imaging of other urinary organs: Secondary | ICD-10-CM | POA: Diagnosis not present

## 2015-11-27 DIAGNOSIS — N200 Calculus of kidney: Secondary | ICD-10-CM | POA: Diagnosis not present

## 2015-11-27 DIAGNOSIS — Z0389 Encounter for observation for other suspected diseases and conditions ruled out: Secondary | ICD-10-CM | POA: Diagnosis not present

## 2015-11-27 DIAGNOSIS — M8580 Other specified disorders of bone density and structure, unspecified site: Secondary | ICD-10-CM | POA: Diagnosis not present

## 2015-11-27 DIAGNOSIS — K8020 Calculus of gallbladder without cholecystitis without obstruction: Secondary | ICD-10-CM | POA: Diagnosis not present

## 2015-11-29 ENCOUNTER — Other Ambulatory Visit (HOSPITAL_COMMUNITY): Payer: Medicare Other

## 2015-12-03 ENCOUNTER — Other Ambulatory Visit: Payer: Self-pay

## 2015-12-03 ENCOUNTER — Ambulatory Visit (HOSPITAL_COMMUNITY): Payer: Medicare Other | Attending: Cardiology

## 2015-12-03 DIAGNOSIS — I517 Cardiomegaly: Secondary | ICD-10-CM | POA: Diagnosis not present

## 2015-12-03 DIAGNOSIS — I519 Heart disease, unspecified: Secondary | ICD-10-CM | POA: Diagnosis not present

## 2015-12-03 DIAGNOSIS — I10 Essential (primary) hypertension: Secondary | ICD-10-CM

## 2015-12-04 DIAGNOSIS — C9000 Multiple myeloma not having achieved remission: Secondary | ICD-10-CM | POA: Diagnosis not present

## 2015-12-04 DIAGNOSIS — Z7982 Long term (current) use of aspirin: Secondary | ICD-10-CM | POA: Diagnosis not present

## 2015-12-04 DIAGNOSIS — Z9484 Stem cells transplant status: Secondary | ICD-10-CM | POA: Diagnosis not present

## 2015-12-11 ENCOUNTER — Ambulatory Visit (HOSPITAL_BASED_OUTPATIENT_CLINIC_OR_DEPARTMENT_OTHER): Payer: Medicare Other

## 2015-12-11 ENCOUNTER — Telehealth: Payer: Self-pay | Admitting: *Deleted

## 2015-12-11 ENCOUNTER — Encounter: Payer: Self-pay | Admitting: Hematology

## 2015-12-11 ENCOUNTER — Ambulatory Visit (HOSPITAL_BASED_OUTPATIENT_CLINIC_OR_DEPARTMENT_OTHER): Payer: Medicare Other | Admitting: Hematology

## 2015-12-11 ENCOUNTER — Telehealth: Payer: Self-pay | Admitting: Hematology

## 2015-12-11 ENCOUNTER — Other Ambulatory Visit (HOSPITAL_BASED_OUTPATIENT_CLINIC_OR_DEPARTMENT_OTHER): Payer: Medicare Other

## 2015-12-11 ENCOUNTER — Other Ambulatory Visit: Payer: Medicare Other

## 2015-12-11 VITALS — BP 137/88 | HR 90 | Temp 97.5°F | Resp 16 | Ht 68.5 in | Wt 149.4 lb

## 2015-12-11 DIAGNOSIS — C9001 Multiple myeloma in remission: Secondary | ICD-10-CM | POA: Diagnosis not present

## 2015-12-11 DIAGNOSIS — C9000 Multiple myeloma not having achieved remission: Secondary | ICD-10-CM

## 2015-12-11 DIAGNOSIS — R630 Anorexia: Secondary | ICD-10-CM

## 2015-12-11 DIAGNOSIS — R530 Neoplastic (malignant) related fatigue: Secondary | ICD-10-CM | POA: Diagnosis not present

## 2015-12-11 DIAGNOSIS — G893 Neoplasm related pain (acute) (chronic): Secondary | ICD-10-CM | POA: Diagnosis not present

## 2015-12-11 LAB — COMPREHENSIVE METABOLIC PANEL
ALBUMIN: 3.8 g/dL (ref 3.5–5.0)
ALK PHOS: 138 U/L (ref 40–150)
ALT: 35 U/L (ref 0–55)
ANION GAP: 10 meq/L (ref 3–11)
AST: 34 U/L (ref 5–34)
BILIRUBIN TOTAL: 0.39 mg/dL (ref 0.20–1.20)
BUN: 9.6 mg/dL (ref 7.0–26.0)
CO2: 19 meq/L — AB (ref 22–29)
Calcium: 9.2 mg/dL (ref 8.4–10.4)
Chloride: 109 mEq/L (ref 98–109)
Creatinine: 1.1 mg/dL (ref 0.7–1.3)
EGFR: 71 mL/min/{1.73_m2} — AB (ref >90)
Glucose: 104 mg/dl (ref 70–140)
Potassium: 3.7 mEq/L (ref 3.5–5.1)
Sodium: 138 mEq/L (ref 136–145)
TOTAL PROTEIN: 7.1 g/dL (ref 6.4–8.3)

## 2015-12-11 LAB — CBC & DIFF AND RETIC
BASO%: 0.2 % (ref 0.0–2.0)
Basophils Absolute: 0 10*3/uL (ref 0.0–0.1)
EOS%: 0.4 % (ref 0.0–7.0)
Eosinophils Absolute: 0 10*3/uL (ref 0.0–0.5)
HCT: 46.4 % (ref 38.4–49.9)
HGB: 15.7 g/dL (ref 13.0–17.1)
IMMATURE RETIC FRACT: 5.9 % (ref 3.00–10.60)
LYMPH%: 20.4 % (ref 14.0–49.0)
MCH: 35.4 pg — AB (ref 27.2–33.4)
MCHC: 33.8 g/dL (ref 32.0–36.0)
MCV: 104.7 fL — AB (ref 79.3–98.0)
MONO#: 0.9 10*3/uL (ref 0.1–0.9)
MONO%: 20.2 % — AB (ref 0.0–14.0)
NEUT#: 2.7 10*3/uL (ref 1.5–6.5)
NEUT%: 58.8 % (ref 39.0–75.0)
PLATELETS: 256 10*3/uL (ref 140–400)
RBC: 4.43 10*6/uL (ref 4.20–5.82)
RDW: 13.2 % (ref 11.0–14.6)
Retic %: 0.92 % (ref 0.80–1.80)
Retic Ct Abs: 40.76 10*3/uL (ref 34.80–93.90)
WBC: 4.5 10*3/uL (ref 4.0–10.3)
lymph#: 0.9 10*3/uL (ref 0.9–3.3)

## 2015-12-11 MED ORDER — TRAMADOL HCL 50 MG PO TABS: 100.0000 mg | ORAL_TABLET | Freq: Four times a day (QID) | ORAL | Status: DC | PRN

## 2015-12-11 MED ORDER — ONDANSETRON HCL 4 MG PO TABS: 4.0000 mg | ORAL_TABLET | Freq: Four times a day (QID) | ORAL | Status: DC | PRN

## 2015-12-11 MED ORDER — LENALIDOMIDE 5 MG PO CAPS: 5.0000 mg | ORAL_CAPSULE | Freq: Every day | ORAL | Status: DC

## 2015-12-11 MED ORDER — SODIUM CHLORIDE 0.9 % IV SOLN
Freq: Once | INTRAVENOUS | Status: AC
  Administered 2015-12-11: 11:00:00 via INTRAVENOUS

## 2015-12-11 MED ORDER — LENALIDOMIDE 25 MG PO CAPS: 25.0000 mg | ORAL_CAPSULE | Freq: Every day | ORAL | Status: DC

## 2015-12-11 MED ORDER — ZOLEDRONIC ACID 4 MG/100ML IV SOLN
4.0000 mg | Freq: Once | INTRAVENOUS | Status: AC
  Administered 2015-12-11: 4 mg via INTRAVENOUS
  Filled 2015-12-11: qty 100

## 2015-12-11 NOTE — Telephone Encounter (Signed)
per pof to schpt appt-sent MW email to sh pt trmt-pt to get updated copy b4 leaving

## 2015-12-11 NOTE — Patient Instructions (Signed)

## 2015-12-11 NOTE — Progress Notes (Signed)
[place]  [phone] [phone]) [phone]  Clinic Follow Up Note   Patient Care Team: [name], MD as PCP - General (Internal Medicine) Pixie Casino, MD as Consulting Physician (Cardiology) Truitt Merle, MD as Consulting Physician (Hematology) 12/11/2015   CHIEF COMPLAINTS: Follow-up multiple myeloma    Multiple myeloma ([place])   01/31/2015 Tumor Marker SPEP showed M-SPIKE 1.7g/dl, Cr 1.5, no anemia, hypercalcemia   03/19/2015 Imaging Bone survey showed no discrete lytic lesion, but there is osteopenia and calvarial heterogeneity.   03/27/2015 Initial Diagnosis Multiple myeloma, stage I   03/27/2015 Bone Marrow Biopsy Bone Marrow, Aspirate,Biopsy: - HYPERCELLULAR BONE MARROW FOR AGE WITH PLASMA CELL NEOPLASM 47% - SEE COMMENT. PERIPHERAL BLOOD: - NO SIGNIFICANT MORPHOLOGIC ABNORMALITIES. Diagnosis Note The bone marrow shows increased number of    03/27/2015 Miscellaneous bone marrow Cytogenetics: normal. FISH panel showed the presence of +4 and +11, this is consider standard risk of MM    04/16/2015 - 04/23/2015 Hospital Admission He was admitted for worsening back pain, was found to have a T12 pathological fracture. He underwent kyphoplasty on 04/19/2015.   04/19/2015 - 08/02/2015 Chemotherapy RVD with weekly Velcade 1.3 mg/m, dexamethasone 40 mg, and Revlimid 10 mg daily on day 1-21, every 28 days, cycle 1 Revlimid was interupted by hospitalization, cycle 3 Revlimid dose increased to 25 mg daily due to his improved renal function. total 4 cyc   04/26/2015 - 05/01/2015 Hospital Admission He was admitted to [facility] for syncope episode during his radiation simulation. EKG showed ST elevation, troponin was positive, he underwent cardio catheterization which showed mild stenosis. No intervention was needed. echo showed EF 35-40%   04/29/2015 - 05/10/2015 Radiation Therapy palliative RT to T12, 20Gy in 10 fractions   08/19/2015 Bone Marrow Transplant He underwent  autologous stem cell transplant at [facility].      HISTORY OF PRESENTING ILLNESS (03/15/2015):  Eric Dudley 64 y.o. 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  He has been having low back pain for 3-4 months. He had food posinin 4 month ago, and had frequent diarrhea. He started noticed low back pain since then. The pain is not radiating to leg, he also has intermittent right rib pain, worse with cough and sneezing. He remains to be physically active, able to do all the activities, such as gardening work housework without much limitation, but he doesn't sings slowly because of back pain. He takes Tylenol as needed, does not like the necrotic pain medication. No night sweats, no fever or chillss, no weight loss.   He was seen by his primary care physician Dr. [name], MD and had lab test (see below). He also had bone density scan 2/25 which showed osteoporosis, has not been treated yet.  Current therapy: observation   INTERIM HISTORY: Eric Dudley returns for follow-up. He is accompanied by his wife to the clinic today. He is doing well overall, has recovered well from the bone marrow transplant. His bilateral rib pain has much improved, it still noticeable after he sleeps on his side, and he takes tramadol as needed every few days. His back pain is nearly resolved, no other new pain. He has good appetite and eating well, occasionally still has mild nausea, for which he takes Zofran. No fever or chills. His energy level is much better, his more active lately. He is not using  his cane anymore lately.  MEDICAL HISTORY:  Past Medical History  Diagnosis Date  . Hypertension   . Dyslipidemia   . 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 Cholelithiases   . Hyperlipidemia   . Chronic kidney disease     MILD,CHRONIC    SURGICAL HISTORY: Past Surgical History    Procedure Laterality Date  . Appendectomy  1962  . Shoulder surgery  1999    left  . Knee surgery  2012    right  . Cardiac catheterization N/A 04/30/2015    Procedure: Left Heart Cath and Coronary Angiography;  Surgeon: Peter M Martinique, MD;  Location: [facility] INVASIVE CV LAB CUPID;  Service: Cardiovascular;  Laterality: N/A;  . Cardiolite myocardial perfusion study  04/16/03    NEGITIVE BRUCE PROTOCAL EXERCISE STRESS TEST. EF 70%. NO ISCHEMIA.    SOCIAL HISTORY: History   Social History  . Marital Status: Married    Spouse Name: N/A  . Number of Children: 1   . Years of Education: N/A   Occupational History  . A retired Therapist, occupational    Social History Main Topics  . Smoking status: Former Smoker    Types: Cigars    Quit date: 12/29/2003  . Smokeless tobacco: Former Systems developer    Types: Chew    Quit date: 12/28/1994  . Alcohol Use: Yes     Comment: occasional beer  . Drug Use: No  . Sexual Activity: Not on file   Other Topics Concern  . Not on file   Social History Narrative  . No narrative on file    FAMILY HISTORY: Family History  Problem Relation Age of Onset  . Cancer Mother 28    cervical cancer   . Cancer Father     unknown cancer   . Hypertension Sister   . Cancer Sister     melanoma     ALLERGIES:  is allergic to hydrochlorothiazide; asa; calcium carbonate; oxycodone; amoxicillin; and indapamide.  MEDICATIONS:  Current Outpatient Prescriptions  Medication Sig Dispense Refill  . acyclovir (ZOVIRAX) 400 MG tablet Take 1 tablet (400 mg total) by mouth 2 (two) times daily. 180 tablet 1  . aspirin 81 MG chewable tablet Chew 1 tablet (81 mg total) by mouth daily.    . calcium carbonate (OS-CAL) 600 MG TABS tablet Take 1,200 mg by mouth daily with breakfast.    . clopidogrel (PLAVIX) 75 MG tablet Take 1 tablet (75 mg total) by mouth daily. 90 tablet 3  . cyclobenzaprine (FLEXERIL) 5 MG tablet Take 1 tablet (5 mg total) by mouth 3 (three) times daily. 90 tablet 1   . diphenhydrAMINE (BENADRYL) 25 mg capsule Take 25 mg by mouth at bedtime as needed.    . ezetimibe-simvastatin (VYTORIN) 10-40 MG per tablet Take 1 tablet by mouth at bedtime.    . folic acid (FOLVITE) 1 MG tablet Take 1 mg by mouth daily. Takes for 6 months.    . lidocaine (LIDODERM) 5 % Place 1 patch onto the skin daily. Remove & Discard patch within 12 hours or as directed by MD 30 patch 1  . loperamide (IMODIUM) 2 MG capsule Take 2 mg by mouth 4 (four) times daily as needed.    . metoCLOPramide (REGLAN) 10 MG tablet Take 10 mg by mouth 4 (four) times daily.    . midodrine (PROAMATINE) 10 MG tablet Take 1 tablet (10 mg total) by mouth 3 (three) times daily. 270 tablet 3  . Multiple Vitamins-Minerals (CENTRUM SILVER ULTRA MENS  PO) Take 1 tablet by mouth daily.    . nitroGLYCERIN (NITROSTAT) 0.4 MG SL tablet Place 0.4 mg under the tongue every 5 (five) minutes x 3 doses as needed.    . ondansetron (ZOFRAN) 4 MG tablet Take 1 tablet (4 mg total) by mouth 4 (four) times daily as needed for nausea or vomiting. 30 tablet 2  . prochlorperazine (COMPAZINE) 10 MG tablet Take 10 mg by mouth every 6 (six) hours as needed.     . ranitidine (ZANTAC) 150 MG tablet Take 2 tablets (300 mg total) by mouth at bedtime. 60 tablet 1  . sulfamethoxazole-trimethoprim (BACTRIM DS,SEPTRA DS) 800-160 MG per tablet Take 1 tablet by mouth as directed. Take 1 tablet every   Mon -  Wed  -  Fri   Begin day  30   (  9/26  Through   02/24/16 )  6  Months.    . traMADol (ULTRAM) 50 MG tablet Take 2 tablets (100 mg total) by mouth every 6 (six) hours as needed. 60 tablet 1  . lenalidomide (REVLIMID) 5 MG capsule Take 1 capsule (5 mg total) by mouth daily. 21 capsule 0   No current facility-administered medications for this visit.    REVIEW OF SYSTEMS:   Constitutional: Denies fevers, chills or abnormal night sweats Eyes: Denies blurriness of vision, double vision or watery eyes Ears, nose, mouth, throat, and face: Denies  mucositis or sore throat Respiratory: Denies cough, dyspnea or wheezes Cardiovascular: Denies palpitation, chest discomfort or lower extremity swelling Gastrointestinal:  Denies nausea, heartburn or change in bowel habits Skin: Denies abnormal skin rashes Lymphatics: Denies new lymphadenopathy or easy bruising Neurological:Denies numbness, tingling or new weaknesses Behavioral/Psych: Mood is stable, no new changes  Musculoskeletal: Positive for back pain and intermittent rib pain All other systems were reviewed with the patient and are negative.  PHYSICAL EXAMINATION: ECOG PERFORMANCE STATUS: 1 BP 137/88 mmHg  Pulse 90  Temp(Src) 97.5 F (36.4 C) (Oral)  Resp 16  Ht 5' 8.5" (1.74 m)  Wt 149 lb 6.4 oz (67.767 kg)  BMI 22.38 kg/m2  SpO2 100%  GENERAL: He comes in today with a wheelchair, alert and oriented, no distress, chronically ill-appearing SKIN: skin color, texture, turgor are normal, no rashes or significant lesions except to some healing rash on his hands EYES: normal, conjunctiva are pink and non-injected, sclera clear OROPHARYNX:no exudate, no erythema and lips, buccal mucosa, and tongue normal  NECK: supple, thyroid normal size, non-tender, without nodularity LYMPH:  no palpable lymphadenopathy in the cervical, axillary or inguinal LUNGS: clear to auscultation and percussion with normal breathing effort HEART: regular rate & rhythm and no murmurs and no lower extremity edema ABDOMEN:abdomen soft, non-tender and normal bowel sounds Musculoskeletal:no cyanosis of digits and no clubbing, mild tenderness on bilateral lateral chest wall  PSYCH: alert & oriented x 3 with fluent speech NEURO: no focal motor/sensory deficits  LABORATORY DATA:   CBC Latest Ref Rng 12/11/2015 11/11/2015 10/11/2015  WBC 4.0 - 10.3 10e3/uL 4.5 4.4 7.4  Hemoglobin 13.0 - 17.1 g/dL 15.7 14.3 15.0  Hematocrit 38.4 - 49.9 % 46.4 42.0 43.9  Platelets 140 - 400 10e3/uL 256 255 209   CMP Latest Ref  Rng 12/11/2015 11/11/2015 09/30/2015  Glucose 70 - 140 mg/dl 104 124 103  BUN 7.0 - 26.0 mg/dL 9.6 5.6(L) 5.8(L)  Creatinine 0.7 - 1.3 mg/dL 1.1 0.8 1.2  Sodium 136 - 145 mEq/L 138 139 138  Potassium 3.5 - 5.1 mEq/L 3.7 3.6 3.5  Chloride 101 - 111 mmol/L - - -  CO2 22 - 29 mEq/L 19(L) 18(L) 21(L)  Calcium 8.4 - 10.4 mg/dL 9.2 8.9 9.0  Total Protein 6.4 - 8.3 g/dL 7.1 5.6(L) 5.3(L)  Total Bilirubin 0.20 - 1.20 mg/dL 0.39 <0.30 0.42  Alkaline Phos 40 - 150 U/L 138 119 178(H)  AST 5 - 34 U/L 34 96(H) 30  ALT 0 - 55 U/L 35 89(H) 25   MM lab: SPEP M-protein (g/dl) 03/15/2015: 1.6 05/24/2015: 0.5 06/21/2015: 0.4 07/18/2015 (at [facility]): 0.33 11/27/2015: not det  Serum IgG (mg/dl) 03/15/2015: 2000 05/24/2015: 579 06/21/2015: 673 07/18/2015 ([facility]): 509 11/27/2015: 396  Serum kappa/lamda/ratio 03/15/2015: 690, 0.13, 5308 05/24/2015: 146, 1.27, 115.0 06/21/2015: 86, 0.74, 116.2  24 h urine kappa light chain (mg) 03/18/2015: 2434 05/28/2015: 582 06/24/2015: 322   PATHOLOGY REPORT Bone Marrow, Aspirate,Biopsy, and Clot, right iliac 03/27/2015 - HYPERCELLULAR BONE MARROW FOR AGE WITH PLASMA CELL NEOPLASM. - SEE COMMENT. PERIPHERAL BLOOD: - NO SIGNIFICANT MORPHOLOGIC ABNORMALITIES. Diagnosis Note The bone marrow shows increased number of atypical plasma cells representing 47% of all cells in the aspirate associated with prominent interstitial infiltrates and variably sized aggregates in the clot and biopsy sections. Immunohistochemical stains show that the plasma cells are kappa light chain restricted consistent with plasma cell neoplasm. The background shows trilineage  Cytogenetics: normal FISH panel: +4 and +11  Bone Marrow, Aspirate,Biopsy, and Clot, 11/27/2015 at [place]: [id number] RECEIVED: 11/27/2015 ORDERING PHYSICIAN: CESAR Melburn Hake , MD PATIENT NAME: Eric Dudley BONE MARROW REPORT   Bone Marrow (BM) and Peripheral Blood (PB) FINAL  PATHOLOGIC DIAGNOSIS  BONE MARROW: Hypocellular marrow (10%) with no increase in plasma cells. See comment.  PERIPHERAL BLOOD: Absolute lymphopenia. See CBC data.  COMMENT: The touch prep is cellular and adequate for evaluation. Rare plasma cells are present (<1%). Blasts are not increased, Myeloid and erythroid cells are present in normal proportions with intact maturation and no overt dysplastic changes. Megakaryocytes are absent. The aspirate smears are aspicular and hemodilute.  Bone marrow and clot sections are variably hypocellular (0-20% range, 10% overall). Scattered interstitial plasma cells are present and comprise <5% of marrow cellularity by CD138 immunohistochemical analysis. Blasts are not increased. Myeloid and erythroid cells are present in normal proportions and have no overt morphologic abnormalities. Megakaryocytes are morphologically within normal limits.  Examination of the peripheral blood reveals absolute lymphopenia. There are no circulating plasma cells or evidence of rouleaux formation.   RADIOGRAPHIC STUDIES: CT anginal chest 09/30/2015 IMPRESSION: 1. No pulmonary embolism. 2. No thoracic aortic aneurysm or dissection. 3. Heart size is normal. No pericardial effusion. 4. Mild scarring/atelectasis at each lung base, probably chronic. Lungs otherwise clear. No evidence of pneumonia. No pleural effusion. 5. Evidence of diffuse multiple myeloma involvement throughout the thoracolumbar spine and multiple ribs bilaterally, with areas of present clinical relevance described below. 6. Compression fracture deformity of the T12 vertebral body, known pathologic fracture, status post treatment with vertebroplasty on 04/19/2015. 7. Milder compression deformity of the overlying T11 vertebral body, approximately 40% compressed anteriorly, also presumably pathologic. This vertebral body appeared essentially normal on fluoroscopic images from the earlier  aforementioned vertebroplasty of 04/19/2015. 8. Slight irregularity of the right lateral sixth rib, with subtle cortical sclerosis suggesting nondisplaced healing fracture. This may be a relatively acute fracture and is probably pathologic in nature related to underlying multiple myeloma. 9. Similar irregularity of the left lateral fifth rib, suggesting minimally displaced healing fracture. This may also be a relatively acute fracture  and is likely pathologic in nature related to underlying multiple myeloma. These acute or subacute rib fractures are the most likely source of patient's bilateral chest pain. Additional incidental findings in the upper abdomen: Fatty infiltration of liver, cholelithiasis without evidence of acute cholecystitis, fairly large amount of stool within the nondistended colon of the upper abdomen (constipation? ).   ASSESSMENT & PLAN:  64 year old Caucasian male with past medical history of hypertension, tonsil cancer status post surgery and radiation 10 years ago, now presented with 3-4 months low back pain and intermittent rib pain. His blood test showed M protein 1.7g/dl.  1. IgG multiple myeloma, stage I, standard risk by cytogenetics (+4 and +11), in remission  -I discussed his bone marrow biopsy results with him. He has significant amount of plasma cells in the bone marrow 47%.  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 Based on the new notable myeloma diagnostic criteria, he meets the criteria of multiple myeloma. He has normal albumin and miildly elevated beta 2 microglobulin, this is stage I. -His cytogenetics and Fish panel revealed standard risk. -His thoracic and lumbar spine MRI showed pathologic  compression fracture at T12, focal myeloma deposits at L3 and T9, diffuse marrow signal abnormality consistent with myeloma -I recommended induction chemotherapy with VRD regimen, weekly Velcade and dexamethasone, first 2 cycles Revlimid dose reduced to 10 mg daily 3 week on, one-week off, based on his renal function, and it which was increased to full dose from cycle 3. He received a total of 4 cycles of treatment, achieved a very good partial response. Repeat bone marrow on 07/18/2015 showed 5% plasma cells, fish and cytogenetics was normal. -His post transplant evaluation showed complete remission (<1% plasma cell in marrow, undetectable M protein), and he has recovered well well. -We discussed Revlimid maintenance therapy posttransplant. I have discussed with Dr. Norma Fredrickson, due to his hypocellular marrow, we recommend low-dose Revlimid 5 mg daily, 21 days on, one-week off, started on 12/29/2015. Potential side effects were reviewed with him again, he agrees to proceed. Prescription was sent out today  2. T12 pathologic compression fracture, and diffuse myeloma involvement in bones -Status post kyphoplasty and palliative radiation  -He received palliative Radiation, pain much improved  -continue zometa infusion monthly  4. Back and rib pain, secondary to multiple myeloma bone lesions  -much improved  -Continue tylenol, tramadol 100 mg as needed  5 nonobstructive CAD, STEMI, Non ischemic CM, Takatsubo, with EF of 35-40% -Patient was initially found to have troponin elevation and anterior apical ST elevation MI and resultant cardiomyopathy on 04/26/15 -He underwent a cardiac catheterization on 5/3 which showed mild diffuse disease, no stents were placed. He is on aspirin -follow up with Dr. Debara Pickett next week  -Repeat echo before transplant showed EF 55-60%  6. Fatigue and anorexia -much improved now  7. Advanced directives -his advanced directives is in EPIC -he is full  code.   Plan -Zometa infusion today and monthly  -Start Revlimid 5 mg daily, 3 weeks on, one-week off, on 12/29/2015 -I refilled Zofran and tramadol for him today -I'll see him back in 4 weeks  All questions were answered. The patient knows to call the clinic with any problems, questions or concerns.  I spent 20 minutes counseling the patient face  to face. The total time spent in the appointment was 30 minutes and more than 50% was on counseling.     Truitt Merle, MD 12/11/2015

## 2015-12-11 NOTE — Telephone Encounter (Signed)
Per staff message and POF I have scheduled appts. Advised scheduler of appts. JMW  

## 2015-12-17 ENCOUNTER — Other Ambulatory Visit: Payer: Self-pay | Admitting: *Deleted

## 2015-12-17 DIAGNOSIS — C9001 Multiple myeloma in remission: Secondary | ICD-10-CM

## 2015-12-17 MED ORDER — LENALIDOMIDE 5 MG PO CAPS: 5.0000 mg | ORAL_CAPSULE | Freq: Every day | ORAL | Status: DC

## 2016-01-03 ENCOUNTER — Telehealth: Payer: Self-pay | Admitting: Internal Medicine

## 2016-01-03 MED ORDER — CLOPIDOGREL BISULFATE 75 MG PO TABS: 75.0000 mg | ORAL_TABLET | Freq: Every day | ORAL | Status: DC

## 2016-01-03 NOTE — Telephone Encounter (Signed)
°*  STAT* If patient is at the pharmacy, call can be transferred to refill team.   1. Which medications need to be refilled? (please list name of each medication and dose if known) Plavix 75mg     2. Which pharmacy/location (including street and city if local pharmacy) is medication to be sent to?[facility] - needs a prescription [place]   3. Do they need a 30 day or 90 day supply? [place]

## 2016-01-07 ENCOUNTER — Encounter: Payer: Self-pay | Admitting: Hematology

## 2016-01-07 ENCOUNTER — Telehealth: Payer: Self-pay | Admitting: Hematology

## 2016-01-07 ENCOUNTER — Ambulatory Visit (HOSPITAL_BASED_OUTPATIENT_CLINIC_OR_DEPARTMENT_OTHER): Payer: Medicare Other | Admitting: Hematology

## 2016-01-07 ENCOUNTER — Other Ambulatory Visit (HOSPITAL_BASED_OUTPATIENT_CLINIC_OR_DEPARTMENT_OTHER): Payer: Medicare Other

## 2016-01-07 ENCOUNTER — Telehealth: Payer: Self-pay | Admitting: *Deleted

## 2016-01-07 ENCOUNTER — Ambulatory Visit (HOSPITAL_BASED_OUTPATIENT_CLINIC_OR_DEPARTMENT_OTHER): Payer: Medicare Other

## 2016-01-07 VITALS — BP 127/48 | HR 97 | Temp 98.0°F | Resp 18 | Ht 68.5 in | Wt 152.2 lb

## 2016-01-07 DIAGNOSIS — C9001 Multiple myeloma in remission: Secondary | ICD-10-CM | POA: Diagnosis not present

## 2016-01-07 DIAGNOSIS — C9000 Multiple myeloma not having achieved remission: Secondary | ICD-10-CM

## 2016-01-07 DIAGNOSIS — G620 Drug-induced polyneuropathy: Secondary | ICD-10-CM | POA: Diagnosis not present

## 2016-01-07 DIAGNOSIS — G893 Neoplasm related pain (acute) (chronic): Secondary | ICD-10-CM

## 2016-01-07 LAB — COMPREHENSIVE METABOLIC PANEL
ALT: 90 U/L — ABNORMAL HIGH (ref 0–55)
AST: 63 U/L — AB (ref 5–34)
Albumin: 4.1 g/dL (ref 3.5–5.0)
Alkaline Phosphatase: 135 U/L (ref 40–150)
Anion Gap: 10 mEq/L (ref 3–11)
BILIRUBIN TOTAL: 0.48 mg/dL (ref 0.20–1.20)
BUN: 7.3 mg/dL (ref 7.0–26.0)
CO2: 18 meq/L — AB (ref 22–29)
Calcium: 9.6 mg/dL (ref 8.4–10.4)
Chloride: 111 mEq/L — ABNORMAL HIGH (ref 98–109)
Creatinine: 1.3 mg/dL (ref 0.7–1.3)
EGFR: 59 mL/min/{1.73_m2} — ABNORMAL LOW (ref >90)
GLUCOSE: 133 mg/dL (ref 70–140)
POTASSIUM: 3.4 meq/L — AB (ref 3.5–5.1)
SODIUM: 138 meq/L (ref 136–145)
Total Protein: 6.9 g/dL (ref 6.4–8.3)

## 2016-01-07 MED ORDER — ZOLEDRONIC ACID 4 MG/100ML IV SOLN
4.0000 mg | Freq: Once | INTRAVENOUS | Status: AC
  Administered 2016-01-07: 4 mg via INTRAVENOUS
  Filled 2016-01-07: qty 100

## 2016-01-07 MED ORDER — SODIUM CHLORIDE 0.9 % IV SOLN
Freq: Once | INTRAVENOUS | Status: AC
  Administered 2016-01-07: 11:00:00 via INTRAVENOUS

## 2016-01-07 NOTE — Progress Notes (Signed)
Reviewed labs with Dr. Burr Medico who states it is okay to proceed with Zometa infusion today.

## 2016-01-07 NOTE — Telephone Encounter (Signed)
Per staff message and POF I have scheduled appts. Advised scheduler of appts. JMW  

## 2016-01-07 NOTE — Patient Instructions (Addendum)
[facility] Discharge Instructions for Patients Receiving Chemotherapy  Today you received the following chemotherapy agents Zometa.  To help prevent nausea and vomiting after your treatment, we encourage you to take your nausea medication as directed.  If you develop nausea and vomiting that is not controlled by your nausea medication, call the clinic.   BELOW ARE SYMPTOMS THAT SHOULD BE REPORTED IMMEDIATELY:  *FEVER GREATER THAN 100.5 F  *CHILLS WITH OR WITHOUT FEVER  NAUSEA AND VOMITING THAT IS NOT CONTROLLED WITH YOUR NAUSEA MEDICATION  *UNUSUAL SHORTNESS OF BREATH  *UNUSUAL BRUISING OR BLEEDING  TENDERNESS IN MOUTH AND THROAT WITH OR WITHOUT PRESENCE OF ULCERS  *URINARY PROBLEMS  *BOWEL PROBLEMS  UNUSUAL RASH Items with * indicate a potential emergency and should be followed up as soon as possible.  Feel free to call the clinic you have any questions or concerns. The clinic phone number is [phone].  Please show the CHEMO ALERT CARD at check-in to the Emergency Department and triage nurse.    

## 2016-01-07 NOTE — Telephone Encounter (Signed)
Talked to patient here in office. Scheduled appt.       [facility]. °

## 2016-01-07 NOTE — Progress Notes (Signed)
Eric Dudley  [phone] Fax:([phone]) [phone]  Clinic Follow Up Note   Patient Care Team: Eric Seashore, MD as PCP - General (Internal Medicine) Eric Casino, MD as Consulting Physician (Cardiology) Eric Merle, MD as Consulting Physician (Hematology) 01/07/2016   CHIEF COMPLAINTS: Follow-up multiple myeloma    Multiple myeloma in remission ([place])   01/31/2015 Tumor Marker SPEP showed M-SPIKE 1.7g/dl, Cr 1.5, no anemia, hypercalcemia   03/19/2015 Imaging Bone survey showed no discrete lytic lesion, but there is osteopenia and calvarial heterogeneity.   03/27/2015 Initial Diagnosis Multiple myeloma, stage I   03/27/2015 Bone Marrow Biopsy Bone Marrow, Aspirate,Biopsy: - HYPERCELLULAR BONE MARROW FOR AGE WITH PLASMA CELL NEOPLASM 47% - SEE COMMENT. PERIPHERAL BLOOD: - NO SIGNIFICANT MORPHOLOGIC ABNORMALITIES. Diagnosis Note The bone marrow shows increased number of    03/27/2015 Miscellaneous bone marrow Cytogenetics: normal. FISH panel showed the presence of +4 and +11, this is consider standard risk of MM    04/16/2015 - 04/23/2015 Hospital Admission He was admitted for worsening back pain, was found to have a T12 pathological fracture. He underwent kyphoplasty on 04/19/2015.   04/19/2015 - 08/02/2015 Chemotherapy RVD with weekly Velcade 1.3 mg/m, dexamethasone 40 mg, and Revlimid 10 mg daily on day 1-21, every 28 days, cycle 1 Revlimid was interupted by hospitalization, cycle 3 Revlimid dose increased to 25 mg daily due to his improved renal function. total 4 cyc   04/26/2015 - 05/01/2015 Hospital Admission He was admitted to [facility] for syncope episode during his radiation simulation. EKG showed ST elevation, troponin was positive, he underwent cardio catheterization which showed mild stenosis. No intervention was needed. echo showed EF 35-40%   04/29/2015 - 05/10/2015 Radiation Therapy palliative RT to T12, 20Gy in 10 fractions   08/19/2015 Bone Marrow Transplant He  underwent autologous stem cell transplant at [facility].    11/27/2015 Bone Marrow Biopsy hypocellular marrow (10%), no increased plasma cells    12/29/2015 -  Chemotherapy maintenance Revlimid 89m daily, 3 weeks on, one week off      HISTORY OF PRESENTING ILLNESS (03/15/2015):  Eric [id number].[id number]. 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  He has been having low back pain for 3-4 months. He had food posinin 4 month ago, and had frequent diarrhea. He started noticed low back pain since then. The pain is not radiating to leg, he also has intermittent right rib pain, worse with cough and sneezing. He remains to be physically active, able to do all the activities, such as gardening work housework without much limitation, but he doesn't sings slowly because of back pain. He takes Tylenol as needed, does not like the necrotic pain medication. No night sweats, no fever or chillss, no weight loss.   He was seen by his primary care physician Dr. RMerrilee Seashore MD and had lab test (see below). He also had bone density scan 2/25 which showed osteoporosis, has not been treated yet.  Current therapy: Maintenance Revlimid 5 mg daily, 3 weeks on, one-week off, started on 12/29/2015  INTERIM HISTORY: Mr. TMesickreturns for follow-up. He came in by himself today. He has started Revlimid 5 mg daily on 12/29/2015. He has been tolerating well, no complaints. He overall feels better, the pain is nearly resolved, he takes tramadol occasionally. 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 His appetite and energy level are good. He  has mild neuropathy on his fingers since he bone marrow transplant, he has no difficulty with his hand function.  MEDICAL HISTORY:  Past Medical History  Diagnosis Date  . Hypertension   . Dyslipidemia   . 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 Cholelithiases    . Hyperlipidemia   . Chronic kidney disease     MILD,CHRONIC    SURGICAL HISTORY: Past Surgical History  Procedure Laterality Date  . Appendectomy  1962  . Shoulder surgery  1999    left  . Knee surgery  2012    right  . Cardiac catheterization N/A 04/30/2015    Procedure: Left Heart Cath and Coronary Angiography;  Surgeon: Eric M Martinique, MD;  Location: [facility] INVASIVE CV LAB CUPID;  Service: Cardiovascular;  Laterality: N/A;  . Cardiolite myocardial perfusion study  04/16/03    NEGITIVE BRUCE PROTOCAL EXERCISE STRESS TEST. EF 70%. NO ISCHEMIA.    SOCIAL HISTORY: History   Social History  . Marital Status: Married    Spouse Name: N/A  . Number of Children: 1   . Years of Education: N/A   Occupational History  . A retired Therapist, occupational    Social History Main Topics  . Smoking status: Former Smoker    Types: Cigars    Quit date: 12/29/2003  . Smokeless tobacco: Former Systems developer    Types: Chew    Quit date: 12/28/1994  . Alcohol Use: Yes     Comment: occasional beer  . Drug Use: No  . Sexual Activity: Not on file   Other Topics Concern  . Not on file   Social History Narrative  . No narrative on file    FAMILY HISTORY: Family History  Problem Relation Age of Onset  . Cancer Mother 50    cervical cancer   . Cancer Father     unknown cancer   . Hypertension Sister   . Cancer Sister     melanoma     ALLERGIES:  is allergic to hydrochlorothiazide; asa; calcium carbonate; oxycodone; amoxicillin; and indapamide.  MEDICATIONS:  Current Outpatient Prescriptions  Medication Sig Dispense Refill  . acyclovir (ZOVIRAX) 400 MG tablet Take 1 tablet (400 mg total) by mouth 2 (two) times daily. 180 tablet 1  . aspirin 81 MG chewable tablet Chew 1 tablet (81 mg total) by mouth daily.    . calcium carbonate (OS-CAL) 600 MG TABS tablet Take 1,200 mg by mouth daily with breakfast.    . clopidogrel (PLAVIX) 75 MG tablet Take 1 tablet (75 mg total) by mouth daily. 90 tablet 3    . cyclobenzaprine (FLEXERIL) 5 MG tablet Take 1 tablet (5 mg total) by mouth 3 (three) times daily. 90 tablet 1  . diphenhydrAMINE (BENADRYL) 25 mg capsule Take 25 mg by mouth at bedtime as needed.    . ezetimibe-simvastatin (VYTORIN) 10-40 MG per tablet Take 1 tablet by mouth at bedtime.    . folic acid (FOLVITE) 1 MG tablet Take 1 mg by mouth daily. Takes for 6 months.    [name] lenalidomide (REVLIMID) 5 MG capsule Take 1 capsule (5 mg total) by mouth daily. Adult  Male. Authorization  #    [id number]      12/17/15. Faxed  To  [facility]    [phone]. 21 capsule 0  . lidocaine (LIDODERM) 5 % Place 1 patch onto the skin daily. Remove & Discard patch within 12 hours or as directed by MD 30 patch 1  . loperamide (IMODIUM) 2 MG  capsule Take 2 mg by mouth 4 (four) times daily as needed.    . metoCLOPramide (REGLAN) 10 MG tablet Take 10 mg by mouth 4 (four) times daily.    . midodrine (PROAMATINE) 10 MG tablet Take 1 tablet (10 mg total) by mouth 3 (three) times daily. 270 tablet 3  . Multiple Vitamins-Minerals (CENTRUM SILVER ULTRA MENS PO) Take 1 tablet by mouth daily.    . nitroGLYCERIN (NITROSTAT) 0.4 MG SL tablet Place 0.4 mg under the tongue every 5 (five) minutes x 3 doses as needed.    . ondansetron (ZOFRAN) 4 MG tablet Take 1 tablet (4 mg total) by mouth 4 (four) times daily as needed for nausea or vomiting. 30 tablet 2  . prochlorperazine (COMPAZINE) 10 MG tablet Take 10 mg by mouth every 6 (six) hours as needed.     . ranitidine (ZANTAC) 150 MG tablet Take 2 tablets (300 mg total) by mouth at bedtime. 60 tablet 1  . sulfamethoxazole-trimethoprim (BACTRIM DS,SEPTRA DS) 800-160 MG per tablet Take 1 tablet by mouth as directed. Take 1 tablet every   Mon -  Wed  -  Fri   Begin day  30   (  9/26  Through   02/24/16 )  6  Months.    . traMADol (ULTRAM) 50 MG tablet Take 2 tablets (100 mg total) by mouth every 6 (six) hours as needed. 60 tablet 1   No current facility-administered  medications for this visit.    REVIEW OF SYSTEMS:   Constitutional: Denies fevers, chills or abnormal night sweats Eyes: Denies blurriness of vision, double vision or watery eyes Ears, nose, mouth, throat, and face: Denies mucositis or sore throat Respiratory: Denies cough, dyspnea or wheezes Cardiovascular: Denies palpitation, chest discomfort or lower extremity swelling Gastrointestinal:  Denies nausea, heartburn or change in bowel habits Skin: Denies abnormal skin rashes Lymphatics: Denies new lymphadenopathy or easy bruising Neurological:Denies numbness, tingling or new weaknesses Behavioral/Psych: Mood is stable, no new changes  Musculoskeletal: Positive for back pain and intermittent rib pain All other systems were reviewed with the patient and are negative.  PHYSICAL EXAMINATION: ECOG PERFORMANCE STATUS: 1 BP 127/48 mmHg  Pulse 97  Temp(Src) 98 F (36.7 C) (Oral)  Resp 18  Ht 5' 8.5" (1.74 m)  Wt 152 lb 3.2 oz (69.037 kg)  BMI 22.80 kg/m2  SpO2 96%  GENERAL: He comes in today with a wheelchair, alert and oriented, no distress, chronically ill-appearing SKIN: skin color, texture, turgor are normal, no rashes or significant lesions except to some healing rash on his hands EYES: normal, conjunctiva are pink and non-injected, sclera clear OROPHARYNX:no exudate, no erythema and lips, buccal mucosa, and tongue normal  NECK: supple, thyroid normal size, non-tender, without nodularity LYMPH:  no palpable lymphadenopathy in the cervical, axillary or inguinal LUNGS: clear to auscultation and percussion with normal breathing effort HEART: regular rate & rhythm and no murmurs and no lower extremity edema ABDOMEN:abdomen soft, non-tender and normal bowel sounds Musculoskeletal:no cyanosis of digits and no clubbing, mild tenderness on bilateral lateral chest wall  PSYCH: alert & oriented x 3 with fluent speech NEURO: no focal motor/sensory deficits  LABORATORY DATA:   CBC Latest  Ref Rng 12/11/2015 11/11/2015 10/11/2015  WBC 4.0 - 10.3 10e3/uL 4.5 4.4 7.4  Hemoglobin 13.0 - 17.1 g/dL 15.7 14.3 15.0  Hematocrit 38.4 - 49.9 % 46.4 42.0 43.9  Platelets 140 - 400 10e3/uL 256 255 209   CMP Latest Ref Rng 01/07/2016 12/11/2015 11/11/2015  Glucose  70 - 140 mg/dl 133 104 124  BUN 7.0 - 26.0 mg/dL 7.3 9.6 5.6(L)  Creatinine 0.7 - 1.3 mg/dL 1.3 1.1 0.8  Sodium 136 - 145 mEq/L 138 138 139  Potassium 3.5 - 5.1 mEq/L 3.4(L) 3.7 3.6  Chloride 101 - 111 mmol/L - - -  CO2 22 - 29 mEq/L 18(L) 19(L) 18(L)  Calcium 8.4 - 10.4 mg/dL 9.6 9.2 8.9  Total Protein 6.4 - 8.3 g/dL 6.9 7.1 5.6(L)  Total Bilirubin 0.20 - 1.20 mg/dL 0.48 0.39 <0.30  Alkaline Phos 40 - 150 U/L 135 138 119  AST 5 - 34 U/L 63(H) 34 96(H)  ALT 0 - 55 U/L 90(H) 35 89(H)   MM lab: SPEP M-protein (g/dl) 03/15/2015: 1.6 05/24/2015: 0.5 06/21/2015: 0.4 07/18/2015 (at [facility]): 0.33 11/27/2015: not det  Serum IgG (mg/dl) 03/15/2015: 2000 05/24/2015: 579 06/21/2015: 673 07/18/2015 ([facility]): 509 11/27/2015: 396  Serum kappa/lamda/ratio 03/15/2015: 690, 0.13, 5308 05/24/2015: 146, 1.27, 115.0 06/21/2015: 86, 0.74, 116.2  24 h urine kappa light chain (mg) 03/18/2015: 2434 05/28/2015: 582 06/24/2015: 322   PATHOLOGY REPORT Bone Marrow, Aspirate,Biopsy, and Clot, right iliac 03/27/2015 - HYPERCELLULAR BONE MARROW FOR AGE WITH PLASMA CELL NEOPLASM. - SEE COMMENT. PERIPHERAL BLOOD: - NO SIGNIFICANT MORPHOLOGIC ABNORMALITIES. Diagnosis Note The bone marrow shows increased number of atypical plasma cells representing 47% of all cells in the aspirate associated with prominent interstitial infiltrates and variably sized aggregates in the clot and biopsy sections. Immunohistochemical stains show that the plasma cells are kappa light chain restricted consistent with plasma cell neoplasm. The background shows trilineage  Cytogenetics: normal FISH panel: +4 and +11  Bone Marrow, Aspirate,Biopsy, and Clot, 11/27/2015  at [place]: [id number] RECEIVED: 11/27/2015 ORDERING PHYSICIAN: CESAR Melburn Hake , MD PATIENT NAME: Eric Dudley BONE MARROW REPORT   Bone Marrow (BM) and Peripheral Blood (PB) FINAL PATHOLOGIC DIAGNOSIS  BONE MARROW: Hypocellular marrow (10%) with no increase in plasma cells. See comment.  PERIPHERAL BLOOD: Absolute lymphopenia. See CBC data.  COMMENT: The touch prep is cellular and adequate for evaluation. Rare plasma cells are present (<1%). Blasts are not increased, Myeloid and erythroid cells are present in normal proportions with intact maturation and no overt dysplastic changes. Megakaryocytes are absent. The aspirate smears are aspicular and hemodilute.  Bone marrow and clot sections are variably hypocellular (0-20% range, 10% overall). Scattered interstitial plasma cells are present and comprise <5% of marrow cellularity by CD138 immunohistochemical analysis. Blasts are not increased. Myeloid and erythroid cells are present in normal proportions and have no overt morphologic abnormalities. Megakaryocytes are morphologically within normal limits.  Examination of the peripheral blood reveals absolute lymphopenia. There are no circulating plasma cells or evidence of rouleaux formation.   RADIOGRAPHIC STUDIES: CT anginal chest 09/30/2015 IMPRESSION: 1. No pulmonary embolism. 2. No thoracic aortic aneurysm or dissection. 3. Heart size is normal. No pericardial effusion. 4. Mild scarring/atelectasis at each lung base, probably chronic. Lungs otherwise clear. No evidence of pneumonia. No pleural effusion. 5. Evidence of diffuse multiple myeloma involvement throughout the thoracolumbar spine and multiple ribs bilaterally, with areas of present clinical relevance described below. 6. Compression fracture deformity of the T12 vertebral body, known pathologic fracture, status post treatment with vertebroplasty on 04/19/2015. 7. Milder  compression deformity of the overlying T11 vertebral body, approximately 40% compressed anteriorly, also presumably pathologic. This vertebral body appeared essentially normal on fluoroscopic images from the earlier aforementioned vertebroplasty of 04/19/2015. 8. Slight irregularity of the right lateral sixth rib, with subtle cortical sclerosis  suggesting nondisplaced healing fracture. This may be a relatively acute fracture and is probably pathologic in nature related to underlying multiple myeloma. 9. Similar irregularity of the left lateral fifth rib, suggesting minimally displaced healing fracture. This may also be a relatively acute fracture and is likely pathologic in nature related to underlying multiple myeloma. These acute or subacute rib fractures are the most likely source of patient's bilateral chest pain. Additional incidental findings in the upper abdomen: Fatty infiltration of liver, cholelithiasis without evidence of acute cholecystitis, fairly large amount of stool within the nondistended colon of the upper abdomen (constipation? ).   ASSESSMENT & PLAN:  65 year old Caucasian male with past medical history of hypertension, tonsil cancer status post surgery and radiation 10 years ago, now presented with 3-4 months low back pain and intermittent rib pain. His blood test showed M protein 1.7g/dl.  1. IgG multiple myeloma, stage I, standard risk by cytogenetics (+4 and +11), in remission  -I previously discussed his initial bone marrow biopsy results with him. He has significant amount of plasma cells in the bone marrow 47%.  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 Based on the new notable myeloma diagnostic criteria, he  meets the criteria of multiple myeloma. He has normal albumin and miildly elevated beta 2 microglobulin, this is stage I. -His cytogenetics and Fish panel revealed standard risk. -His thoracic and lumbar spine MRI showed pathologic compression fracture at T12, focal myeloma deposits at L3 and T9, diffuse marrow signal abnormality consistent with myeloma -I recommended induction chemotherapy with VRD regimen, weekly Velcade and dexamethasone, first 2 cycles Revlimid dose reduced to 10 mg daily 3 week on, one-week off, based on his renal function, and it which was increased to full dose from cycle 3. He received a total of 4 cycles of treatment, achieved a very good partial response. Repeat bone marrow on 07/18/2015 showed 5% plasma cells, fish and cytogenetics was normal. -His post transplant evaluation showed complete remission (<1% plasma cell in marrow, undetectable M protein), and he has recovered very well. -He has started maintenance Revlimid 5 mg daily, 3 weeks on, 1 week off, tolerating well, we'll continue.  2. T12 pathologic compression fracture, and diffuse myeloma involvement in bones -Status post kyphoplasty and palliative radiation  -He received palliative Radiation, pain much improved  -continue zometa infusion monthly  4. 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 He is on aspirin -follow up with Dr. Debara Pickett  -Repeat echo before transplant showed EF 55-60%  6. Peripheral neuropathy, grade 1 -Likely secondary to his bone marrow transplant chemotherapy -Mild and Stable  overall, we'll continue observation  6. Advanced directives -his advanced directives is in EPIC -he is full code.   Plan -Zometa infusion today and monthly  -continue Revlimid 5 mg daily, 3 weeks on, one-week off, he will finish first cycle on 1/21. -lab on 1/23, if CBC adequate, will  start cycle 2 on 1/29 -I will see him back on 2/21 with next zometa infusion    All questions were answered. The patient knows to call the clinic with any problems, questions or concerns.  I spent 20 minutes counseling the patient face to face. The total time spent in the appointment was 30 minutes and more than 50% was on counseling.     Eric Merle, MD 01/07/2016

## 2016-01-13 ENCOUNTER — Other Ambulatory Visit: Payer: Medicare Other

## 2016-01-13 ENCOUNTER — Ambulatory Visit: Payer: Medicare Other

## 2016-01-16 DIAGNOSIS — E782 Mixed hyperlipidemia: Secondary | ICD-10-CM | POA: Diagnosis not present

## 2016-01-16 DIAGNOSIS — N183 Chronic kidney disease, stage 3 (moderate): Secondary | ICD-10-CM | POA: Diagnosis not present

## 2016-01-16 DIAGNOSIS — I10 Essential (primary) hypertension: Secondary | ICD-10-CM | POA: Diagnosis not present

## 2016-01-20 ENCOUNTER — Other Ambulatory Visit: Payer: Self-pay | Admitting: *Deleted

## 2016-01-20 DIAGNOSIS — C9001 Multiple myeloma in remission: Secondary | ICD-10-CM

## 2016-01-20 MED ORDER — LENALIDOMIDE 5 MG PO CAPS: 5.0000 mg | ORAL_CAPSULE | Freq: Every day | ORAL | Status: DC

## 2016-01-21 ENCOUNTER — Other Ambulatory Visit (HOSPITAL_BASED_OUTPATIENT_CLINIC_OR_DEPARTMENT_OTHER): Payer: Medicare Other

## 2016-01-21 DIAGNOSIS — C9001 Multiple myeloma in remission: Secondary | ICD-10-CM | POA: Diagnosis not present

## 2016-01-21 LAB — CBC WITH DIFFERENTIAL/PLATELET
BASO%: 0.5 % (ref 0.0–2.0)
Basophils Absolute: 0 10*3/uL (ref 0.0–0.1)
EOS ABS: 0.2 10*3/uL (ref 0.0–0.5)
EOS%: 4.8 % (ref 0.0–7.0)
HCT: 49.1 % (ref 38.4–49.9)
HGB: 16.5 g/dL (ref 13.0–17.1)
LYMPH%: 16.3 % (ref 14.0–49.0)
MCH: 35 pg — AB (ref 27.2–33.4)
MCHC: 33.5 g/dL (ref 32.0–36.0)
MCV: 104.5 fL — AB (ref 79.3–98.0)
MONO#: 1 10*3/uL — ABNORMAL HIGH (ref 0.1–0.9)
MONO%: 23.4 % — AB (ref 0.0–14.0)
NEUT%: 55 % (ref 39.0–75.0)
NEUTROS ABS: 2.2 10*3/uL (ref 1.5–6.5)
Platelets: 242 10*3/uL (ref 140–400)
RBC: 4.7 10*6/uL (ref 4.20–5.82)
RDW: 14.3 % (ref 11.0–14.6)
WBC: 4.1 10*3/uL (ref 4.0–10.3)
lymph#: 0.7 10*3/uL — ABNORMAL LOW (ref 0.9–3.3)

## 2016-01-22 LAB — PROTEIN ELECTROPHORESIS, SERUM
A/G Ratio: 1.4 (ref 0.7–1.7)
ALPHA 2: 1.1 g/dL — AB (ref 0.4–1.0)
Albumin: 4 g/dL (ref 2.9–4.4)
Alpha 1: 0.2 g/dL (ref 0.0–0.4)
BETA: 1 g/dL (ref 0.7–1.3)
GAMMA GLOBULIN: 0.5 g/dL (ref 0.4–1.8)
GLOBULIN, TOTAL: 2.8 g/dL (ref 2.2–3.9)

## 2016-01-22 LAB — KAPPA/LAMBDA LIGHT CHAINS
Ig Kappa Free Light Chain: 10.96 mg/L (ref 3.30–19.40)
Ig Lambda Free Light Chain: 12.15 mg/L (ref 5.71–26.30)
Kappa/Lambda FluidC Ratio: 0.9 (ref 0.26–1.65)

## 2016-01-24 LAB — IMMUNOFIXATION ELECTROPHORESIS
IgA, Qn, Serum: <5 mg/dL — ABNORMAL LOW (ref 61–437)
IgG, Qn, Serum: 487 mg/dL — ABNORMAL LOW (ref 700–1600)
IgM, Qn, Serum: 21 mg/dL (ref 20–172)
Total Protein: 6.8 g/dL (ref 6.0–8.5)

## 2016-02-17 ENCOUNTER — Ambulatory Visit (HOSPITAL_BASED_OUTPATIENT_CLINIC_OR_DEPARTMENT_OTHER): Payer: Medicare Other | Admitting: Hematology

## 2016-02-17 ENCOUNTER — Encounter: Payer: Self-pay | Admitting: Hematology

## 2016-02-17 ENCOUNTER — Other Ambulatory Visit (HOSPITAL_BASED_OUTPATIENT_CLINIC_OR_DEPARTMENT_OTHER): Payer: Medicare Other

## 2016-02-17 ENCOUNTER — Telehealth: Payer: Self-pay | Admitting: *Deleted

## 2016-02-17 VITALS — BP 130/74 | HR 78 | Temp 98.3°F | Resp 18 | Ht 68.5 in | Wt 147.0 lb

## 2016-02-17 DIAGNOSIS — C9001 Multiple myeloma in remission: Secondary | ICD-10-CM

## 2016-02-17 DIAGNOSIS — R740 Nonspecific elevation of levels of transaminase and lactic acid dehydrogenase [LDH]: Secondary | ICD-10-CM | POA: Diagnosis not present

## 2016-02-17 DIAGNOSIS — E876 Hypokalemia: Secondary | ICD-10-CM

## 2016-02-17 DIAGNOSIS — G893 Neoplasm related pain (acute) (chronic): Secondary | ICD-10-CM

## 2016-02-17 DIAGNOSIS — C9000 Multiple myeloma not having achieved remission: Secondary | ICD-10-CM

## 2016-02-17 DIAGNOSIS — G620 Drug-induced polyneuropathy: Secondary | ICD-10-CM

## 2016-02-17 LAB — COMPREHENSIVE METABOLIC PANEL
ALBUMIN: 4.1 g/dL (ref 3.5–5.0)
ALK PHOS: 146 U/L (ref 40–150)
ALT: 201 U/L — AB (ref 0–55)
AST: 132 U/L — AB (ref 5–34)
Anion Gap: 10 mEq/L (ref 3–11)
BILIRUBIN TOTAL: 0.65 mg/dL (ref 0.20–1.20)
BUN: 7.9 mg/dL (ref 7.0–26.0)
CO2: 19 meq/L — AB (ref 22–29)
CREATININE: 1.2 mg/dL (ref 0.7–1.3)
Calcium: 9.4 mg/dL (ref 8.4–10.4)
Chloride: 109 mEq/L (ref 98–109)
EGFR: 64 mL/min/{1.73_m2} — AB (ref >90)
GLUCOSE: 98 mg/dL (ref 70–140)
Potassium: 3.2 mEq/L — ABNORMAL LOW (ref 3.5–5.1)
SODIUM: 138 meq/L (ref 136–145)
TOTAL PROTEIN: 7.4 g/dL (ref 6.4–8.3)

## 2016-02-17 LAB — CBC WITH DIFFERENTIAL/PLATELET
BASO%: 0.9 % (ref 0.0–2.0)
Basophils Absolute: 0 10*3/uL (ref 0.0–0.1)
EOS ABS: 0.3 10*3/uL (ref 0.0–0.5)
EOS%: 6.7 % (ref 0.0–7.0)
HCT: 49.9 % (ref 38.4–49.9)
HEMOGLOBIN: 16.9 g/dL (ref 13.0–17.1)
LYMPH%: 16 % (ref 14.0–49.0)
MCH: 34.9 pg — ABNORMAL HIGH (ref 27.2–33.4)
MCHC: 33.8 g/dL (ref 32.0–36.0)
MCV: 103.4 fL — AB (ref 79.3–98.0)
MONO#: 1.3 10*3/uL — ABNORMAL HIGH (ref 0.1–0.9)
MONO%: 28.8 % — AB (ref 0.0–14.0)
NEUT%: 47.6 % (ref 39.0–75.0)
NEUTROS ABS: 2.2 10*3/uL (ref 1.5–6.5)
Platelets: 169 10*3/uL (ref 140–400)
RBC: 4.82 10*6/uL (ref 4.20–5.82)
RDW: 15.1 % — AB (ref 11.0–14.6)
WBC: 4.5 10*3/uL (ref 4.0–10.3)
lymph#: 0.7 10*3/uL — ABNORMAL LOW (ref 0.9–3.3)

## 2016-02-17 NOTE — Progress Notes (Signed)
Eric Dudley  [phone] [phone]  Clinic Follow Up Note   Patient Care Team: Eric Seashore, MD as PCP - General (Internal Medicine) Eric Casino, MD as Consulting Physician (Cardiology) Eric Merle, MD as Consulting Physician (Hematology) 02/17/2016   CHIEF COMPLAINTS: Follow-up multiple myeloma    Multiple myeloma in remission ([place])   01/31/2015 Tumor Marker SPEP showed M-SPIKE 1.7g/dl, Cr 1.5, no anemia, hypercalcemia   03/19/2015 Imaging Bone survey showed no discrete lytic lesion, but there is osteopenia and calvarial heterogeneity.   03/27/2015 Initial Diagnosis Multiple myeloma, stage I   03/27/2015 Bone Marrow Biopsy Bone Marrow, Aspirate,Biopsy: - HYPERCELLULAR BONE MARROW FOR AGE WITH PLASMA CELL NEOPLASM 47% - SEE COMMENT. PERIPHERAL BLOOD: - NO SIGNIFICANT MORPHOLOGIC ABNORMALITIES. Diagnosis Note The bone marrow shows increased number of    03/27/2015 Miscellaneous bone marrow Cytogenetics: normal. FISH panel showed the presence of +4 and +11, this is consider standard risk of MM    04/16/2015 - 04/23/2015 Hospital Admission He was admitted for worsening back pain, was found to have a T12 pathological fracture. He underwent kyphoplasty on 04/19/2015.   04/19/2015 - 08/02/2015 Chemotherapy RVD with weekly Velcade 1.3 mg/m, dexamethasone 40 mg, and Revlimid 10 mg daily on day 1-21, every 28 days, cycle 1 Revlimid was interupted by hospitalization, cycle 3 Revlimid dose increased to 25 mg daily due to his improved renal function. total 4 cyc   04/26/2015 - 05/01/2015 Hospital Admission He was admitted to [facility] for syncope episode during his radiation simulation. EKG showed ST elevation, troponin was positive, he underwent cardio catheterization which showed mild stenosis. No intervention was needed. echo showed EF 35-40%   04/29/2015 - 05/10/2015 Radiation Therapy palliative RT to T12, 20Gy in 10 fractions   08/19/2015 Bone Marrow Transplant He  underwent autologous stem cell transplant at [facility].    11/27/2015 Bone Marrow Biopsy hypocellular marrow (10%), no increased plasma cells    12/29/2015 -  Chemotherapy maintenance Revlimid 67m daily, 3 weeks on, one week off      HISTORY OF PRESENTING ILLNESS (03/15/2015):  [name]65y.o. 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  He has been having low back pain for 3-4 months. He had food posinin 4 month ago, and had frequent diarrhea. He started noticed low back pain since then. The pain is not radiating to leg, he also has intermittent right rib pain, worse with cough and sneezing. He remains to be physically active, able to do all the activities, such as gardening work housework without much limitation, but he doesn't sings slowly because of back pain. He takes Tylenol as needed, does not like the necrotic pain medication. No night sweats, no fever or chillss, no weight loss.   He was seen by his primary care physician Dr. RMerrilee Seashore MD and had lab test (see below). He also had bone density scan 2/25 which showed osteoporosis, has not been treated yet.  Current therapy: Maintenance Revlimid 5 mg daily, 3 weeks on, one-week off, started on 12/29/2015  INTERIM HISTORY: Mr. TWintreturns for follow-up. He came in by himself today. He completed the current cycle of Revlimid 2 days ago, tolerated well. He noticed moderate dry cough 2 days ago, no sore throat, sputum production, dyspnea, chest pain, fever or chills. He has been taking Mucinex 5 mg at nigh, which helps. He  overall feels well, occasional mild back pain, is not taking any pain medication. He tolerates routine activity without difficulty, but does feel tired quickly and needs a break. He is functionally well at home. No other new complaints  MEDICAL HISTORY:  Past Medical History    Diagnosis Date  . Hypertension   . Dyslipidemia   . 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 Cholelithiases   . Hyperlipidemia   . Chronic kidney disease     MILD,CHRONIC    SURGICAL HISTORY: Past Surgical History  Procedure Laterality Date  . Appendectomy  1962  . Shoulder surgery  1999    left  . Knee surgery  2012    right  . Cardiac catheterization N/A 04/30/2015    Procedure: Left Heart Cath and Coronary Angiography;  Surgeon: Eric M Martinique, MD;  Location: [facility] INVASIVE CV LAB CUPID;  Service: Cardiovascular;  Laterality: N/A;  . Cardiolite myocardial perfusion study  04/16/03    NEGITIVE BRUCE PROTOCAL EXERCISE STRESS TEST. EF 70%. NO ISCHEMIA.    SOCIAL HISTORY: History   Social History  . Marital Status: Married    Spouse Name: N/A  . Number of Children: 1   . Years of Education: N/A   Occupational History  . A retired Therapist, occupational    Social History Main Topics  . Smoking status: Former Smoker    Types: Cigars    Quit date: 12/29/2003  . Smokeless tobacco: Former Systems developer    Types: Chew    Quit date: 12/28/1994  . Alcohol Use: Yes     Comment: occasional beer  . Drug Use: No  . Sexual Activity: Not on file   Other Topics Concern  . Not on file   Social History Narrative  . No narrative on file    FAMILY HISTORY: Family History  Problem Relation Age of Onset  . Cancer Mother 33    cervical cancer   . Cancer Father     unknown cancer   . Hypertension Sister   . Cancer Sister     melanoma     ALLERGIES:  is allergic to hydrochlorothiazide; asa; calcium carbonate; oxycodone; amoxicillin; and indapamide.  MEDICATIONS:  Current Outpatient Prescriptions  Medication Sig Dispense Refill  . acyclovir (ZOVIRAX) 400 MG tablet Take 1 tablet (400 mg total) by mouth 2 (two) times daily. 180 tablet 1  . aspirin 81 MG chewable tablet Chew 1 tablet (81 mg total) by mouth daily.    . calcium carbonate (OS-CAL) 600 MG TABS tablet Take 1,200 mg by mouth daily with  breakfast.    . clopidogrel (PLAVIX) 75 MG tablet Take 1 tablet (75 mg total) by mouth daily. 90 tablet 3  . cyclobenzaprine (FLEXERIL) 5 MG tablet Take 1 tablet (5 mg total) by mouth 3 (three) times daily. 90 tablet 1  . diphenhydrAMINE (BENADRYL) 25 mg capsule Take 25 mg by mouth at bedtime as needed.    . ezetimibe-simvastatin (VYTORIN) 10-40 MG per tablet Take 1 tablet by mouth at bedtime.    [name] lenalidomide (REVLIMID) 5 MG capsule Take 1 capsule (5 mg total) by mouth daily. Take Revlimid  5 mg by mouth daily  For  21 days ,  Rest  7 days. 21 capsule 0  . lidocaine (LIDODERM) 5 % Place 1 patch onto the skin daily. Remove & Discard patch within 12 hours or as directed by MD 30 patch 1  . loperamide (IMODIUM) 2 MG capsule Take 2 mg by mouth 4 (four) times daily  as needed.    . metoCLOPramide (REGLAN) 10 MG tablet Take 10 mg by mouth 4 (four) times daily.    . midodrine (PROAMATINE) 10 MG tablet Take 1 tablet (10 mg total) by mouth 3 (three) times daily. 270 tablet 3  . Multiple Vitamins-Minerals (CENTRUM SILVER ULTRA MENS PO) Take 1 tablet by mouth daily.    . nitroGLYCERIN (NITROSTAT) 0.4 MG SL tablet Place 0.4 mg under the tongue every 5 (five) minutes x 3 doses as needed.    . ondansetron (ZOFRAN) 4 MG tablet Take 1 tablet (4 mg total) by mouth 4 (four) times daily as needed for nausea or vomiting. 30 tablet 2  . prochlorperazine (COMPAZINE) 10 MG tablet Take 10 mg by mouth every 6 (six) hours as needed.     . ranitidine (ZANTAC) 150 MG tablet Take 2 tablets (300 mg total) by mouth at bedtime. 60 tablet 1  . sulfamethoxazole-trimethoprim (BACTRIM DS,SEPTRA DS) 800-160 MG per tablet Take 1 tablet by mouth as directed. Take 1 tablet every   Mon -  Wed  -  Fri   Begin day  30   (  9/26  Through   02/24/16 )  6  Months.    . traMADol (ULTRAM) 50 MG tablet Take 2 tablets (100 mg total) by mouth every 6 (six) hours as needed. 60 tablet 1   No current facility-administered medications for this visit.      REVIEW OF SYSTEMS:   Constitutional: Denies fevers, chills or abnormal night sweats Eyes: Denies blurriness of vision, double vision or watery eyes Ears, nose, mouth, throat, and face: Denies mucositis or sore throat Respiratory: Denies cough, dyspnea or wheezes Cardiovascular: Denies palpitation, chest discomfort or lower extremity swelling Gastrointestinal:  Denies nausea, heartburn or change in bowel habits Skin: Denies abnormal skin rashes Lymphatics: Denies new lymphadenopathy or easy bruising Neurological:Denies numbness, tingling or new weaknesses Behavioral/Psych: Mood is stable, no new changes  Musculoskeletal: Positive for back pain and intermittent rib pain All other systems were reviewed with the patient and are negative.  PHYSICAL EXAMINATION: ECOG PERFORMANCE STATUS: 1 BP 130/74 mmHg  Pulse 78  Temp(Src) 98.3 F (36.8 C) (Oral)  Resp 18  Ht 5' 8.5" (1.74 m)  Wt 147 lb (66.679 kg)  BMI 22.02 kg/m2  SpO2 100%  GENERAL: He comes in today with a wheelchair, alert and oriented, no distress, chronically ill-appearing SKIN: skin color, texture, turgor are normal, no rashes or significant lesions except to some healing rash on his hands EYES: normal, conjunctiva are pink and non-injected, sclera clear OROPHARYNX:no exudate, no erythema and lips, buccal mucosa, and tongue normal  NECK: supple, thyroid normal size, non-tender, without nodularity LYMPH:  no palpable lymphadenopathy in the cervical, axillary or inguinal LUNGS: clear to auscultation and percussion with normal breathing effort HEART: regular rate & rhythm and no murmurs and no lower extremity edema ABDOMEN:abdomen soft, non-tender and normal bowel sounds Musculoskeletal:no cyanosis of digits and no clubbing, mild tenderness on bilateral lateral chest wall  PSYCH: alert & oriented x 3 with fluent speech NEURO: no focal motor/sensory deficits  LABORATORY DATA:   CBC Latest Ref Rng 02/17/2016 01/21/2016  12/11/2015  WBC 4.0 - 10.3 10e3/uL 4.5 4.1 4.5  Hemoglobin 13.0 - 17.1 g/dL 16.9 16.5 15.7  Hematocrit 38.4 - 49.9 % 49.9 49.1 46.4  Platelets 140 - 400 10e3/uL 169 242 256   CMP Latest Ref Rng 02/17/2016 01/21/2016 01/07/2016  Glucose 70 - 140 mg/dl 98 - 133  BUN 7.0 -  26.0 mg/dL 7.9 - 7.3  Creatinine 0.7 - 1.3 mg/dL 1.2 - 1.3  Sodium 136 - 145 mEq/L 138 - 138  Potassium 3.5 - 5.1 mEq/L 3.2(L) - 3.4(L)  CO2 22 - 29 mEq/L 19(L) - 18(L)  Calcium 8.4 - 10.4 mg/dL 9.4 - 9.6  Total Protein 6.4 - 8.3 g/dL 7.4 6.8 6.9  Total Bilirubin 0.20 - 1.20 mg/dL 0.65 - 0.48  Alkaline Phos 40 - 150 U/L 146 - 135  AST 5 - 34 U/L 132(H) - 63(H)  ALT 0 - 55 U/L 201(H) - 90(H)   MM lab: SPEP M-protein (g/dl) 03/15/2015: 1.6 05/24/2015: 0.5 06/21/2015: 0.4 07/18/2015 (at [facility]): 0.33 11/27/2015: not det 01/21/2016: not det   Serum IgG (mg/dl) 03/15/2015: 2000 05/24/2015: 579 06/21/2015: 673 07/18/2015 ([facility]): 509 11/27/2015: 396 01/21/2016: 487  Serum kappa/lamda/ratio (mg/dl)  03/15/2015: 690, 0.13, 5308 05/24/2015: 146, 1.27, 115.0 06/21/2015: 86, 0.74, 116.2 01/21/2016: 1.09, 1.21, 0.9  24 h urine kappa light chain (mg) 03/18/2015: 2434 05/28/2015: 582 06/24/2015: 322   PATHOLOGY REPORT Bone Marrow, Aspirate,Biopsy, and Clot, right iliac 03/27/2015 - HYPERCELLULAR BONE MARROW FOR AGE WITH PLASMA CELL NEOPLASM. - SEE COMMENT. PERIPHERAL BLOOD: - NO SIGNIFICANT MORPHOLOGIC ABNORMALITIES. Diagnosis Note The bone marrow shows increased number of atypical plasma cells representing 47% of all cells in the aspirate associated with prominent interstitial infiltrates and variably sized aggregates in the clot and biopsy sections. Immunohistochemical stains show that the plasma cells are kappa light chain restricted consistent with plasma cell neoplasm. The background shows trilineage  Cytogenetics: normal FISH panel: +4 and +11  Bone Marrow, Aspirate,Biopsy, and Clot, 11/27/2015 at [place]: [id number] RECEIVED: 11/27/2015 ORDERING PHYSICIAN: CESAR Melburn Hake , MD PATIENT NAME: Eric Dudley BONE MARROW REPORT   Bone Marrow (BM) and Peripheral Blood (PB) FINAL PATHOLOGIC DIAGNOSIS  BONE MARROW: Hypocellular marrow (10%) with no increase in plasma cells. See comment.  PERIPHERAL BLOOD: Absolute lymphopenia. See CBC data.  COMMENT: The touch prep is cellular and adequate for evaluation. Rare plasma cells are present (<1%). Blasts are not increased, Myeloid and erythroid cells are present in normal proportions with intact maturation and no overt dysplastic changes. Megakaryocytes are absent. The aspirate smears are aspicular and hemodilute.  Bone marrow and clot sections are variably hypocellular (0-20% range, 10% overall). Scattered interstitial plasma cells are present and comprise <5% of marrow cellularity by CD138 immunohistochemical analysis. Blasts are not increased. Myeloid and erythroid cells are present in normal proportions and have no overt morphologic abnormalities. Megakaryocytes are morphologically within normal limits.  Examination of the peripheral blood reveals absolute lymphopenia. There are no circulating plasma cells or evidence of rouleaux formation.   RADIOGRAPHIC STUDIES: CT anginal chest 09/30/2015 IMPRESSION: 1. No pulmonary embolism. 2. No thoracic aortic aneurysm or dissection. 3. Heart size is normal. No pericardial effusion. 4. Mild scarring/atelectasis at each lung base, probably chronic. Lungs otherwise clear. No evidence of pneumonia. No pleural effusion. 5. Evidence of diffuse multiple myeloma involvement throughout the thoracolumbar spine and multiple ribs bilaterally, with areas of present clinical relevance described below. 6. Compression fracture deformity of the T12 vertebral body, known pathologic fracture, status post treatment with vertebroplasty on 04/19/2015. 7. Milder compression  deformity of the overlying T11 vertebral body, approximately 40% compressed anteriorly, also presumably pathologic. This vertebral body appeared essentially normal on fluoroscopic images from the earlier aforementioned vertebroplasty of 04/19/2015. 8. Slight irregularity of the right lateral sixth rib, with subtle cortical sclerosis suggesting nondisplaced healing fracture. This may be  a relatively acute fracture and is probably pathologic in nature related to underlying multiple myeloma. 9. Similar irregularity of the left lateral fifth rib, suggesting minimally displaced healing fracture. This may also be a relatively acute fracture and is likely pathologic in nature related to underlying multiple myeloma. These acute or subacute rib fractures are the most likely source of patient's bilateral chest pain. Additional incidental findings in the upper abdomen: Fatty infiltration of liver, cholelithiasis without evidence of acute cholecystitis, fairly large amount of stool within the nondistended colon of the upper abdomen (constipation? ).   ASSESSMENT & PLAN:  65 year old Caucasian male with past medical history of hypertension, tonsil cancer status post surgery and radiation 10 years ago, now presented with 3-4 months low back pain and intermittent rib pain. His blood test showed M protein 1.7g/dl.  1. IgG multiple myeloma, stage I, standard risk by cytogenetics (+4 and +11), in remission  -I previously discussed his initial bone marrow biopsy results with him. He has significant amount of plasma cells in the bone marrow 47%.  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 Based on the new notable myeloma diagnostic criteria, he meets the  criteria of multiple myeloma. He has normal albumin and miildly elevated beta 2 microglobulin, this is stage I. -His cytogenetics and Fish panel revealed standard risk. -His thoracic and lumbar spine MRI showed pathologic compression fracture at T12, focal myeloma deposits at L3 and T9, diffuse marrow signal abnormality consistent with myeloma -I recommended induction chemotherapy with VRD regimen, weekly Velcade and dexamethasone, first 2 cycles Revlimid dose reduced to 10 mg daily 3 week on, one-week off, based on his renal function, and it which was increased to full dose from cycle 3. He received a total of 4 cycles of treatment, achieved a very good partial response. Repeat bone marrow on 07/18/2015 showed 5% plasma cells, fish and cytogenetics was normal. -His post transplant evaluation showed complete remission (<1% plasma cell in marrow, undetectable M protein), and he has recovered very well. -He has started maintenance Revlimid 5 mg daily, 3 weeks on, 1 week off, tolerating well, we'll continue. -Due to the elevated liver enzymes, I'll postpone his next cycle of Revlimid  2.  Transaminitis - he has elevated ALT and AST, normal bilirubin today , possible related to medications - We'll repeat a CMP on March 3,  And continue monitor closely.  3. T12 pathologic compression fracture, and diffuse myeloma involvement in bones -Status post kyphoplasty and palliative radiation  -He received palliative Radiation, pain much improved  -continue zometa infusion monthly  4. nonobstructive CAD, STEMI, Non ischemic CM, Takatsubo, with EF of 35-40% -Patient was initially found to have troponin elevation and anterior apical ST elevation MI and resultant cardiomyopathy on 04/26/15 -He underwent a cardiac catheterization on 5/3 which showed mild diffuse disease, no stents were placed. He is on aspirin -follow up with Dr. Debara Pickett  -Repeat echo before transplant showed EF 55-60%  5. Peripheral neuropathy,  grade 1 -Likely secondary to his bone marrow transplant chemotherapy -Mild and Stable overall, we'll continue observation  6.  Hypokalemia - potassium 3.2 today, he will resume potassium 1 tablet daily,  He has enough supply at home. - repeat lab on next visit.  6. Advanced directives -his advanced directives  is in EPIC -he is full code.   Plan -today's lab work reviewed with patient, he has significantly elevated liver enzymes, we'll repeat CMP on 3/3, if improves, then start next cycle of Revlimid on March 6 - he has a follow-up appointment with Dr. Norma Fredrickson at [facility] on March 8 -Zometa infusion tomorrow and monthly  -I'll see him back on March 28 with zometa infusion   All questions were answered. The patient knows to call the clinic with any problems, questions or concerns.  I spent 20 minutes counseling the patient face to face. The total time spent in the appointment was 30 minutes and more than 50% was on counseling.     Eric Merle, MD 02/17/2016

## 2016-02-17 NOTE — Telephone Encounter (Signed)
Per staff message and POF I have scheduled appts. Advised scheduler of appts. JMW  

## 2016-02-18 ENCOUNTER — Encounter: Payer: Medicare Other | Admitting: Hematology

## 2016-02-18 ENCOUNTER — Other Ambulatory Visit: Payer: Self-pay | Admitting: Internal Medicine

## 2016-02-18 ENCOUNTER — Other Ambulatory Visit: Payer: Medicare Other

## 2016-02-18 ENCOUNTER — Ambulatory Visit (HOSPITAL_BASED_OUTPATIENT_CLINIC_OR_DEPARTMENT_OTHER): Payer: Medicare Other

## 2016-02-18 VITALS — BP 131/71 | HR 74 | Temp 97.8°F | Resp 18

## 2016-02-18 DIAGNOSIS — C9001 Multiple myeloma in remission: Secondary | ICD-10-CM | POA: Diagnosis not present

## 2016-02-18 MED ORDER — SODIUM CHLORIDE 0.9 % IV SOLN
Freq: Once | INTRAVENOUS | Status: AC
  Administered 2016-02-18: 14:00:00 via INTRAVENOUS

## 2016-02-18 MED ORDER — ZOLEDRONIC ACID 4 MG/100ML IV SOLN
4.0000 mg | Freq: Once | INTRAVENOUS | Status: AC
  Administered 2016-02-18: 4 mg via INTRAVENOUS
  Filled 2016-02-18: qty 100

## 2016-02-18 NOTE — Patient Instructions (Signed)

## 2016-02-19 ENCOUNTER — Other Ambulatory Visit: Payer: Self-pay | Admitting: *Deleted

## 2016-02-19 DIAGNOSIS — C9001 Multiple myeloma in remission: Secondary | ICD-10-CM

## 2016-02-19 MED ORDER — LENALIDOMIDE 5 MG PO CAPS: 5.0000 mg | ORAL_CAPSULE | Freq: Every day | ORAL | Status: DC

## 2016-02-23 NOTE — Progress Notes (Signed)
This encounter was created in error - please disregard.  This encounter was created in error - please disregard.

## 2016-02-28 ENCOUNTER — Other Ambulatory Visit (HOSPITAL_BASED_OUTPATIENT_CLINIC_OR_DEPARTMENT_OTHER): Payer: Medicare Other

## 2016-02-28 DIAGNOSIS — C9001 Multiple myeloma in remission: Secondary | ICD-10-CM

## 2016-02-28 LAB — CBC WITH DIFFERENTIAL/PLATELET
BASO%: 1.1 % (ref 0.0–2.0)
BASOS ABS: 0 10*3/uL (ref 0.0–0.1)
EOS ABS: 0 10*3/uL (ref 0.0–0.5)
EOS%: 1.2 % (ref 0.0–7.0)
HCT: 46.8 % (ref 38.4–49.9)
HEMOGLOBIN: 15.6 g/dL (ref 13.0–17.1)
LYMPH%: 20.9 % (ref 14.0–49.0)
MCH: 35 pg — AB (ref 27.2–33.4)
MCHC: 33.3 g/dL (ref 32.0–36.0)
MCV: 105 fL — AB (ref 79.3–98.0)
MONO#: 0.8 10*3/uL (ref 0.1–0.9)
MONO%: 19.9 % — AB (ref 0.0–14.0)
NEUT#: 2.3 10*3/uL (ref 1.5–6.5)
NEUT%: 56.9 % (ref 39.0–75.0)
Platelets: 264 10*3/uL (ref 140–400)
RBC: 4.46 10*6/uL (ref 4.20–5.82)
RDW: 15.1 % — AB (ref 11.0–14.6)
WBC: 4 10*3/uL (ref 4.0–10.3)
lymph#: 0.8 10*3/uL — ABNORMAL LOW (ref 0.9–3.3)

## 2016-02-28 LAB — COMPREHENSIVE METABOLIC PANEL
ALBUMIN: 3.7 g/dL (ref 3.5–5.0)
ALT: 75 U/L — AB (ref 0–55)
ANION GAP: 6 meq/L (ref 3–11)
AST: 37 U/L — AB (ref 5–34)
Alkaline Phosphatase: 131 U/L (ref 40–150)
BUN: 4.8 mg/dL — AB (ref 7.0–26.0)
CALCIUM: 8.9 mg/dL (ref 8.4–10.4)
CO2: 22 mEq/L (ref 22–29)
CREATININE: 1.3 mg/dL (ref 0.7–1.3)
Chloride: 111 mEq/L — ABNORMAL HIGH (ref 98–109)
EGFR: 55 mL/min/{1.73_m2} — ABNORMAL LOW (ref >90)
Glucose: 75 mg/dl (ref 70–140)
Potassium: 4.1 mEq/L (ref 3.5–5.1)
Sodium: 140 mEq/L (ref 136–145)
Total Bilirubin: 0.44 mg/dL (ref 0.20–1.20)
Total Protein: 6.6 g/dL (ref 6.4–8.3)

## 2016-03-04 DIAGNOSIS — C9000 Multiple myeloma not having achieved remission: Secondary | ICD-10-CM | POA: Diagnosis not present

## 2016-03-04 DIAGNOSIS — Z9484 Stem cells transplant status: Secondary | ICD-10-CM | POA: Diagnosis not present

## 2016-03-04 DIAGNOSIS — Z23 Encounter for immunization: Secondary | ICD-10-CM | POA: Diagnosis not present

## 2016-03-24 ENCOUNTER — Other Ambulatory Visit (HOSPITAL_BASED_OUTPATIENT_CLINIC_OR_DEPARTMENT_OTHER): Payer: Medicare Other

## 2016-03-24 ENCOUNTER — Encounter: Payer: Self-pay | Admitting: Hematology

## 2016-03-24 ENCOUNTER — Telehealth: Payer: Self-pay | Admitting: Hematology

## 2016-03-24 ENCOUNTER — Ambulatory Visit (HOSPITAL_BASED_OUTPATIENT_CLINIC_OR_DEPARTMENT_OTHER): Payer: Medicare Other | Admitting: Hematology

## 2016-03-24 ENCOUNTER — Ambulatory Visit (HOSPITAL_BASED_OUTPATIENT_CLINIC_OR_DEPARTMENT_OTHER): Payer: Medicare Other

## 2016-03-24 ENCOUNTER — Other Ambulatory Visit: Payer: Self-pay | Admitting: *Deleted

## 2016-03-24 VITALS — BP 113/63 | HR 68 | Temp 98.5°F | Resp 18 | Wt 147.3 lb

## 2016-03-24 DIAGNOSIS — C9001 Multiple myeloma in remission: Secondary | ICD-10-CM

## 2016-03-24 DIAGNOSIS — N182 Chronic kidney disease, stage 2 (mild): Secondary | ICD-10-CM

## 2016-03-24 DIAGNOSIS — I10 Essential (primary) hypertension: Secondary | ICD-10-CM

## 2016-03-24 DIAGNOSIS — M4850XD Collapsed vertebra, not elsewhere classified, site unspecified, subsequent encounter for fracture with routine healing: Secondary | ICD-10-CM

## 2016-03-24 DIAGNOSIS — C9000 Multiple myeloma not having achieved remission: Secondary | ICD-10-CM

## 2016-03-24 DIAGNOSIS — G620 Drug-induced polyneuropathy: Secondary | ICD-10-CM | POA: Diagnosis not present

## 2016-03-24 DIAGNOSIS — I428 Other cardiomyopathies: Secondary | ICD-10-CM

## 2016-03-24 DIAGNOSIS — E876 Hypokalemia: Secondary | ICD-10-CM

## 2016-03-24 DIAGNOSIS — R740 Nonspecific elevation of levels of transaminase and lactic acid dehydrogenase [LDH]: Secondary | ICD-10-CM

## 2016-03-24 DIAGNOSIS — IMO0001 Reserved for inherently not codable concepts without codable children: Secondary | ICD-10-CM

## 2016-03-24 DIAGNOSIS — G893 Neoplasm related pain (acute) (chronic): Secondary | ICD-10-CM

## 2016-03-24 LAB — CBC WITH DIFFERENTIAL/PLATELET
BASO%: 0.3 % (ref 0.0–2.0)
Basophils Absolute: 0 10*3/uL (ref 0.0–0.1)
EOS%: 4.9 % (ref 0.0–7.0)
Eosinophils Absolute: 0.2 10*3/uL (ref 0.0–0.5)
HCT: 47.6 % (ref 38.4–49.9)
HEMOGLOBIN: 15.9 g/dL (ref 13.0–17.1)
LYMPH%: 15 % (ref 14.0–49.0)
MCH: 34.7 pg — AB (ref 27.2–33.4)
MCHC: 33.3 g/dL (ref 32.0–36.0)
MCV: 104.2 fL — ABNORMAL HIGH (ref 79.3–98.0)
MONO#: 1.1 10*3/uL — ABNORMAL HIGH (ref 0.1–0.9)
MONO%: 21.8 % — AB (ref 0.0–14.0)
NEUT%: 58 % (ref 39.0–75.0)
NEUTROS ABS: 2.8 10*3/uL (ref 1.5–6.5)
Platelets: 193 10*3/uL (ref 140–400)
RBC: 4.57 10*6/uL (ref 4.20–5.82)
RDW: 15.1 % — AB (ref 11.0–14.6)
WBC: 4.8 10*3/uL (ref 4.0–10.3)
lymph#: 0.7 10*3/uL — ABNORMAL LOW (ref 0.9–3.3)

## 2016-03-24 LAB — COMPREHENSIVE METABOLIC PANEL
ALBUMIN: 3.7 g/dL (ref 3.5–5.0)
ALK PHOS: 120 U/L (ref 40–150)
ALT: 75 U/L — AB (ref 0–55)
AST: 38 U/L — AB (ref 5–34)
Anion Gap: 8 mEq/L (ref 3–11)
BILIRUBIN TOTAL: 0.78 mg/dL (ref 0.20–1.20)
BUN: 5 mg/dL — AB (ref 7.0–26.0)
CO2: 19 mEq/L — ABNORMAL LOW (ref 22–29)
CREATININE: 1.1 mg/dL (ref 0.7–1.3)
Calcium: 8.8 mg/dL (ref 8.4–10.4)
Chloride: 109 mEq/L (ref 98–109)
EGFR: 67 mL/min/{1.73_m2} — ABNORMAL LOW (ref >90)
GLUCOSE: 95 mg/dL (ref 70–140)
Potassium: 3.3 mEq/L — ABNORMAL LOW (ref 3.5–5.1)
SODIUM: 137 meq/L (ref 136–145)
TOTAL PROTEIN: 6.8 g/dL (ref 6.4–8.3)

## 2016-03-24 MED ORDER — SODIUM CHLORIDE 0.9 % IV SOLN
Freq: Once | INTRAVENOUS | Status: AC
  Administered 2016-03-24: 14:00:00 via INTRAVENOUS

## 2016-03-24 MED ORDER — ZOLEDRONIC ACID 4 MG/100ML IV SOLN
4.0000 mg | Freq: Once | INTRAVENOUS | Status: AC
  Administered 2016-03-24: 4 mg via INTRAVENOUS
  Filled 2016-03-24: qty 100

## 2016-03-24 MED ORDER — LENALIDOMIDE 5 MG PO CAPS: 5.0000 mg | ORAL_CAPSULE | Freq: Every day | ORAL | Status: DC

## 2016-03-24 NOTE — Telephone Encounter (Signed)
Gave and printed appt sched and avs for pt for April °

## 2016-03-24 NOTE — Progress Notes (Signed)
[place]  [phone] [phone]  Clinic Follow Up Note   Patient Care Team: Eric Seashore, MD as PCP - General (Internal Medicine) Eric Casino, MD as Consulting Physician (Cardiology) Eric Merle, MD as Consulting Physician (Hematology) 03/24/2016   CHIEF COMPLAINTS: Follow-up multiple myeloma    Multiple myeloma in remission ([place])   01/31/2015 Tumor Marker SPEP showed M-SPIKE 1.7g/dl, Cr 1.5, no anemia, hypercalcemia   03/19/2015 Imaging Bone survey showed no discrete lytic lesion, but there is osteopenia and calvarial heterogeneity.   03/27/2015 Initial Diagnosis Multiple myeloma, stage I   03/27/2015 Bone Marrow Biopsy Bone Marrow, Aspirate,Biopsy: - HYPERCELLULAR BONE MARROW FOR AGE WITH PLASMA CELL NEOPLASM 47% - SEE COMMENT. PERIPHERAL BLOOD: - NO SIGNIFICANT MORPHOLOGIC ABNORMALITIES. Diagnosis Note The bone marrow shows increased number of    03/27/2015 Miscellaneous bone marrow Cytogenetics: normal. FISH panel showed the presence of +4 and +11, this is consider standard risk of MM    04/16/2015 - 04/23/2015 Hospital Admission He was admitted for worsening back pain, was found to have a T12 pathological fracture. He underwent kyphoplasty on 04/19/2015.   04/19/2015 - 08/02/2015 Chemotherapy RVD with weekly Velcade 1.3 mg/m, dexamethasone 40 mg, and Revlimid 10 mg daily on day 1-21, every 28 days, cycle 1 Revlimid was interupted by hospitalization, cycle 3 Revlimid dose increased to 25 mg daily due to his improved renal function. total 4 cyc   04/26/2015 - 05/01/2015 Hospital Admission He was admitted to [facility] for syncope episode during his radiation simulation. EKG showed ST elevation, troponin was positive, he underwent cardio catheterization which showed mild stenosis. No intervention was needed. echo showed EF 35-40%   04/29/2015 - 05/10/2015 Radiation Therapy palliative RT to T12, 20Gy in 10 fractions   08/19/2015 Bone Marrow Transplant He  underwent autologous stem cell transplant at [facility].    11/27/2015 Bone Marrow Biopsy hypocellular marrow (10%), no increased plasma cells    12/29/2015 -  Chemotherapy maintenance Revlimid 23m daily, 3 weeks on, one week off      HISTORY OF PRESENTING ILLNESS (03/15/2015):  Eric Gitelman65y.[id number]. 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  He has been having low back pain for 3-4 months. He had food posinin 4 month ago, and had frequent diarrhea. He started noticed low back pain since then. The pain is not radiating to leg, he also has intermittent right rib pain, worse with cough and sneezing. He remains to be physically active, able to do all the activities, such as gardening work housework without much limitation, but he doesn't sings slowly because of back pain. He takes Tylenol as needed, does not like the necrotic pain medication. No night sweats, no fever or chillss, no weight loss.   He was seen by his primary care physician Dr. RMerrilee Seashore MD and had lab test (see below). He also had bone density scan 2/25 which showed osteoporosis, has not been treated yet.  Current therapy: Maintenance Revlimid 5 mg daily, 3 weeks on, one-week off, started on 12/29/2015  INTERIM HISTORY: [name] for follow-up. He came in by himself today. He is doing well, noticed some left breast tenderness for the past one month, intermittent back pain, which is more related to postion, and does not last long. He is not taking any pain medication. He has good appetite and Eats well, no  fever or chills, no cough, he has been having some nasal congestion from seasonal allegy, he is much more active and goes out often lately.  MEDICAL HISTORY:  Past Medical History  Diagnosis Date  . Hypertension   . Dyslipidemia   . 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 Cholelithiases     . Hyperlipidemia   . Chronic kidney disease     MILD,CHRONIC    SURGICAL HISTORY: Past Surgical History  Procedure Laterality Date  . Appendectomy  1962  . Shoulder surgery  1999    left  . Knee surgery  2012    right  . Cardiac catheterization N/A 04/30/2015    Procedure: Left Heart Cath and Coronary Angiography;  Surgeon: Eric M Martinique, MD;  Location: [facility] INVASIVE CV LAB CUPID;  Service: Cardiovascular;  Laterality: N/A;  . Cardiolite myocardial perfusion study  04/16/03    NEGITIVE BRUCE PROTOCAL EXERCISE STRESS TEST. EF 70%. NO ISCHEMIA.    SOCIAL HISTORY: History   Social History  . Marital Status: Married    Spouse Name: N/A  . Number of Children: 1   . Years of Education: N/A   Occupational History  . A retired Therapist, occupational    Social History Main Topics  . Smoking status: Former Smoker    Types: Cigars    Quit date: 12/29/2003  . Smokeless tobacco: Former Systems developer    Types: Chew    Quit date: 12/28/1994  . Alcohol Use: Yes     Comment: occasional beer  . Drug Use: No  . Sexual Activity: Not on file   Other Topics Concern  . Not on file   Social History Narrative  . No narrative on file    FAMILY HISTORY: Family History  Problem Relation Age of Onset  . Cancer Mother 20    cervical cancer   . Cancer Father     unknown cancer   . Hypertension Sister   . Cancer Sister     melanoma     ALLERGIES:  is allergic to hydrochlorothiazide; asa; calcium carbonate; oxycodone; amoxicillin; and indapamide.  MEDICATIONS:  Current Outpatient Prescriptions  Medication Sig Dispense Refill  . acyclovir (ZOVIRAX) 400 MG tablet Take 1 tablet (400 mg total) by mouth 2 (two) times daily. 180 tablet 1  . aspirin 81 MG chewable tablet Chew 1 tablet (81 mg total) by mouth daily.    . calcium carbonate (OS-CAL) 600 MG TABS tablet Take 1,200 mg by mouth daily with breakfast.    . clopidogrel (PLAVIX) 75 MG tablet Take 1 tablet (75 mg total) by mouth daily. 90 tablet 3   . diphenhydrAMINE (BENADRYL) 25 mg capsule Take 25 mg by mouth at bedtime as needed.    . ezetimibe-simvastatin (VYTORIN) 10-40 MG per tablet Take 1 tablet by mouth at bedtime.    [name] guaiFENesin (MUCINEX) 600 MG 12 hr tablet Take by mouth every evening.    [name] lenalidomide (REVLIMID) 5 MG capsule Take 1 capsule (5 mg total) by mouth daily. Take Revlimid  5 mg by mouth daily  For  21 days ,  Rest  7 days. 21 capsule 0  . lidocaine (LIDODERM) 5 % Place 1 patch onto the skin daily. Remove & Discard patch within 12 hours or as directed by MD 30 patch 1  . loperamide (IMODIUM) 2 MG capsule Take 2 mg by mouth 4 (four) times daily as needed. Reported on 02/17/2016    . midodrine (PROAMATINE) 10 MG tablet Take 1 tablet (10 mg  total) by mouth 3 (three) times daily. 270 tablet 3  . Multiple Vitamins-Minerals (CENTRUM SILVER ULTRA MENS PO) Take 1 tablet by mouth daily.    . nitroGLYCERIN (NITROSTAT) 0.4 MG SL tablet Place 0.4 mg under the tongue every 5 (five) minutes x 3 doses as needed.    . ondansetron (ZOFRAN) 4 MG tablet Take 1 tablet (4 mg total) by mouth 4 (four) times daily as needed for nausea or vomiting. 30 tablet 2  . polyethylene glycol (MIRALAX / GLYCOLAX) packet Take 17 g by mouth daily as needed.     . ranitidine (ZANTAC) 150 MG tablet Take 2 tablets (300 mg total) by mouth at bedtime. 60 tablet 1  . traMADol (ULTRAM) 50 MG tablet Take 2 tablets (100 mg total) by mouth every 6 (six) hours as needed. 60 tablet 1   No current facility-administered medications for this visit.    REVIEW OF SYSTEMS:   Constitutional: Denies fevers, chills or abnormal night sweats Eyes: Denies blurriness of vision, double vision or watery eyes Ears, nose, mouth, throat, and face: Denies mucositis or sore throat Respiratory: Denies cough, dyspnea or wheezes Cardiovascular: Denies palpitation, chest discomfort or lower extremity swelling Gastrointestinal:  Denies nausea, heartburn or change in bowel habits Skin:  Denies abnormal skin rashes Lymphatics: Denies new lymphadenopathy or easy bruising Neurological:Denies numbness, tingling or new weaknesses Behavioral/Psych: Mood is stable, no new changes  Musculoskeletal: Positive for back pain and intermittent rib pain All other systems were reviewed with the patient and are negative.  PHYSICAL EXAMINATION: ECOG PERFORMANCE STATUS: 1 BP 113/63 mmHg  Pulse 68  Temp(Src) 98.5 F (36.9 C) (Oral)  Resp 18  Wt 147 lb 4.8 oz (66.815 kg)  SpO2 99%  GENERAL: He comes in today with a wheelchair, alert and oriented, no distress, chronically ill-appearing SKIN: skin color, texture, turgor are normal, no rashes or significant lesions except to some healing rash on his hands EYES: normal, conjunctiva are pink and non-injected, sclera clear OROPHARYNX:no exudate, no erythema and lips, buccal mucosa, and tongue normal  NECK: supple, thyroid normal size, non-tender, without nodularity LYMPH:  no palpable lymphadenopathy in the cervical, axillary or inguinal LUNGS: clear to auscultation and percussion with normal breathing effort HEART: regular rate & rhythm and no murmurs and no lower extremity edema ABDOMEN:abdomen soft, non-tender and normal bowel sounds Musculoskeletal:no cyanosis of digits and no clubbing, mild tenderness on bilateral lateral chest wall.  PSYCH: alert & oriented x 3 with fluent speech NEURO: no focal motor/sensory deficits CHEST WALL: slightly enlarged left breast, mild tenderness, no palpable mass. He states his left breast is slightly bigger than right for all his adult life.  LABORATORY DATA:   CBC Latest Ref Rng 03/24/2016 02/28/2016 02/17/2016  WBC 4.0 - 10.3 10e3/uL 4.8 4.0 4.5  Hemoglobin 13.0 - 17.1 g/dL 15.9 15.6 16.9  Hematocrit 38.4 - 49.9 % 47.6 46.8 49.9  Platelets 140 - 400 10e3/uL 193 264 169   CMP Latest Ref Rng 03/24/2016 02/28/2016 02/17/2016  Glucose 70 - 140 mg/dl 95 75 98  BUN 7.0 - 26.0 mg/dL 5.0(L) 4.8(L) 7.9    Creatinine 0.7 - 1.3 mg/dL 1.1 1.3 1.2  Sodium 136 - 145 mEq/L 137 140 138  Potassium 3.5 - 5.1 mEq/L 3.3(L) 4.1 3.2(L)  CO2 22 - 29 mEq/L 19(L) 22 19(L)  Calcium 8.4 - 10.4 mg/dL 8.8 8.9 9.4  Total Protein 6.4 - 8.3 g/dL 6.8 6.6 7.4  Total Bilirubin 0.20 - 1.20 mg/dL 0.78 0.44 0.65  Alkaline  Phos 40 - 150 U/L 120 131 146  AST 5 - 34 U/L 38(H) 37(H) 132(H)  ALT 0 - 55 U/L 75(H) 75(H) 201(H)   MM lab: SPEP M-protein (g/dl) 03/15/2015: 1.6 05/24/2015: 0.5 06/21/2015: 0.4 07/18/2015 (at [facility]): 0.33 11/27/2015: not det 01/21/2016: not det   Serum IgG (mg/dl) 03/15/2015: 2000 05/24/2015: 579 06/21/2015: 673 07/18/2015 ([facility]): 509 11/27/2015: 396 01/21/2016: 487  Serum kappa/lamda/ratio (mg/dl)  03/15/2015: 690, 0.13, 5308 05/24/2015: 146, 1.27, 115.0 06/21/2015: 86, 0.74, 116.2 01/21/2016: 1.09, 1.21, 0.9  24 h urine kappa light chain (mg) 03/18/2015: 2434 05/28/2015: 582 06/24/2015: 322   PATHOLOGY REPORT Bone Marrow, Aspirate,Biopsy, and Clot, right iliac 03/27/2015 - HYPERCELLULAR BONE MARROW FOR AGE WITH PLASMA CELL NEOPLASM. - SEE COMMENT. PERIPHERAL BLOOD: - NO SIGNIFICANT MORPHOLOGIC ABNORMALITIES. Diagnosis Note The bone marrow shows increased number of atypical plasma cells representing 47% of all cells in the aspirate associated with prominent interstitial infiltrates and variably sized aggregates in the clot and biopsy sections. Immunohistochemical stains show that the plasma cells are kappa light chain restricted consistent with plasma cell neoplasm. The background shows trilineage  Cytogenetics: normal FISH panel: +4 and +11  Bone Marrow, Aspirate,Biopsy, and Clot, 11/27/2015 at [place]: [id number] RECEIVED: 11/27/2015 ORDERING PHYSICIAN: CESAR Melburn Hake , MD PATIENT NAME: Gerrit Heck BONE MARROW REPORT   Bone Marrow (BM) and Peripheral Blood (PB) FINAL PATHOLOGIC DIAGNOSIS  BONE MARROW: Hypocellular marrow (10%) with no  increase in plasma cells. See comment.  PERIPHERAL BLOOD: Absolute lymphopenia. See CBC data.  COMMENT: The touch prep is cellular and adequate for evaluation. Rare plasma cells are present (<1%). Blasts are not increased, Myeloid and erythroid cells are present in normal proportions with intact maturation and no overt dysplastic changes. Megakaryocytes are absent. The aspirate smears are aspicular and hemodilute.  Bone marrow and clot sections are variably hypocellular (0-20% range, 10% overall). Scattered interstitial plasma cells are present and comprise <5% of marrow cellularity by CD138 immunohistochemical analysis. Blasts are not increased. Myeloid and erythroid cells are present in normal proportions and have no overt morphologic abnormalities. Megakaryocytes are morphologically within normal limits.  Examination of the peripheral blood reveals absolute lymphopenia. There are no circulating plasma cells or evidence of rouleaux formation.   RADIOGRAPHIC STUDIES: CT anginal chest 09/30/2015 IMPRESSION: 1. No pulmonary embolism. 2. No thoracic aortic aneurysm or dissection. 3. Heart size is normal. No pericardial effusion. 4. Mild scarring/atelectasis at each lung base, probably chronic. Lungs otherwise clear. No evidence of pneumonia. No pleural effusion. 5. Evidence of diffuse multiple myeloma involvement throughout the thoracolumbar spine and multiple ribs bilaterally, with areas of present clinical relevance described below. 6. Compression fracture deformity of the T12 vertebral body, known pathologic fracture, status post treatment with vertebroplasty on 04/19/2015. 7. Milder compression deformity of the overlying T11 vertebral body, approximately 40% compressed anteriorly, also presumably pathologic. This vertebral body appeared essentially normal on fluoroscopic images from the earlier aforementioned vertebroplasty of 04/19/2015. 8. Slight irregularity of the  right lateral sixth rib, with subtle cortical sclerosis suggesting nondisplaced healing fracture. This may be a relatively acute fracture and is probably pathologic in nature related to underlying multiple myeloma. 9. Similar irregularity of the left lateral fifth rib, suggesting minimally displaced healing fracture. This may also be a relatively acute fracture and is likely pathologic in nature related to underlying multiple myeloma. These acute or subacute rib fractures are the most likely source of patient's bilateral chest pain. Additional incidental findings in the upper abdomen: Fatty infiltration  of liver, cholelithiasis without evidence of acute cholecystitis, fairly large amount of stool within the nondistended colon of the upper abdomen (constipation? ).   ASSESSMENT & PLAN:  65 year old Caucasian male with past medical history of hypertension, tonsil cancer status post surgery and radiation 10 years ago, now presented with 3-4 months low back pain and intermittent rib pain. His blood test showed M protein 1.7g/dl.  1. IgG multiple myeloma, stage I, standard risk by cytogenetics (+4 and +11), in remission  -I previously discussed his initial bone marrow biopsy results with him. He has significant amount of plasma cells in the bone marrow 47%.  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 Based on the new notable myeloma diagnostic criteria, he meets the criteria of multiple myeloma. He has normal albumin and miildly elevated beta 2 microglobulin, this is stage I. -His cytogenetics and Fish panel revealed standard risk. -His thoracic and lumbar spine MRI showed pathologic compression fracture at T12, focal myeloma deposits at L3 and T9,  diffuse marrow signal abnormality consistent with myeloma -I recommended induction chemotherapy with VRD regimen, weekly Velcade and dexamethasone, first 2 cycles Revlimid dose reduced to 10 mg daily 3 week on, one-week off, based on his renal function, and it which was increased to full dose from cycle 3. He received a total of 4 cycles of treatment, achieved a very good partial response. Repeat bone marrow on 07/18/2015 showed 5% plasma cells, fish and cytogenetics was normal. -His post transplant evaluation showed complete remission (<1% plasma cell in marrow, undetectable M protein), and he has recovered very well. -He has started maintenance Revlimid 5 mg daily, 3 weeks on, 1 week off, tolerating well, we'll continue. -lab results reviewed with him, stable mild transaminitis, we'll continue Revlimid, he will start next cycle in 1 week.  2.  Transaminitis - he has had elevated ALT and AST, normal bilirubin possible related to medications -overall mild and stable , we'll continue monitoring  3. T12 pathologic compression fracture, and diffuse myeloma involvement in bones -Status post kyphoplasty and palliative radiation  -He received palliative Radiation, pain much improved  -continue zometa infusion monthly  4. nonobstructive CAD, STEMI, Non ischemic CM, Takatsubo, with EF of 35-40% -Patient was initially found to have troponin elevation and anterior apical ST elevation MI and resultant cardiomyopathy on 04/26/15 -He underwent a cardiac catheterization on 5/3 which showed mild diffuse disease, no stents were placed. He is on aspirin -follow up with Dr. Debara Pickett  -Repeat echo before transplant showed EF 55-60%  5. Peripheral neuropathy, grade 1 -Likely secondary to his bone marrow transplant chemotherapy -Mild and Stable overall, we'll continue observation  6.  Hypokalemia - potassium 3.3 today, he will increase potassium from 2 tab/day to 3 tab daily for one week, then change back to 2 tab  daily,  He has enough supply at home. -we'll continue monitoring  7. Advanced directives -his advanced directives is in EPIC -he is full code.   Plan -lab results reviewed with patient, he will start next cycle of Revlimid on 04/01/2016, refill was called in today  -Zometa infusion today and monthly  -I'll see him back in 4 weeks   All questions were answered. The patient knows to call the clinic with any problems, questions or concerns.  I spent 20 minutes counseling the patient face to face. The total time spent in the appointment was 30 minutes and more than 50% was on counseling.     Eric Merle, MD 03/24/2016

## 2016-03-24 NOTE — Patient Instructions (Addendum)
[facility] Discharge Instructions for Patients Receiving Chemotherapy  Today you received the following chemotherapy agents Zometa. To help prevent nausea and vomiting after your treatment, we encourage you to take your nausea medication as directed.  If you develop nausea and vomiting that is not controlled by your nausea medication, call the clinic.   BELOW ARE SYMPTOMS THAT SHOULD BE REPORTED IMMEDIATELY:  *FEVER GREATER THAN 100.5 F  *CHILLS WITH OR WITHOUT FEVER  NAUSEA AND VOMITING THAT IS NOT CONTROLLED WITH YOUR NAUSEA MEDICATION  *UNUSUAL SHORTNESS OF BREATH  *UNUSUAL BRUISING OR BLEEDING  TENDERNESS IN MOUTH AND THROAT WITH OR WITHOUT PRESENCE OF ULCERS  *URINARY PROBLEMS  *BOWEL PROBLEMS  UNUSUAL RASH Items with * indicate a potential emergency and should be followed up as soon as possible.  Feel free to call the clinic you have any questions or concerns. The clinic phone number is [phone].  Please show the [facility] at check-in to the Emergency Department and triage nurse.   Potassium Content of Foods Potassium is a mineral found in many foods and drinks. It helps keep fluids and minerals balanced in your body and affects how steadily your heart beats. Potassium also helps control your blood pressure and keep your muscles and nervous system healthy. Certain health conditions and medicines may change the balance of potassium in your body. When this happens, you can help balance your level of potassium through the foods that you do or do not eat. Your health care provider or dietitian may recommend an amount of potassium that you should have each day. The following lists of foods provide the amount of potassium (in parentheses) per serving in each item. HIGH IN POTASSIUM  The following foods and beverages have 200 mg or more of potassium per serving:  Apricots, 2 raw or 5 dry (200 mg).  Artichoke, 1 medium (345 mg).  Avocado, raw,   each (245 mg).  Banana, 1 medium (425 mg).  Beans, lima, or baked beans, canned,  cup (280 mg).  Beans, white, canned,  cup (595 mg).  Beef roast, 3 oz (320 mg).  Beef, ground, 3 oz (270 mg).  Beets, raw or cooked,  cup (260 mg).  Bran muffin, 2 oz (300 mg).  Broccoli,  cup (230 mg).  Brussels sprouts,  cup (250 mg).  Cantaloupe,  cup (215 mg).  Cereal, 100% bran,  cup (200-400 mg).  Cheeseburger, single, fast food, 1 each (225-400 mg).  Chicken, 3 oz (220 mg).  Clams, canned, 3 oz (535 mg).  Crab, 3 oz (225 mg).  Dates, 5 each (270 mg).  Dried beans and peas,  cup (300-475 mg).  Figs, dried, 2 each (260 mg).  Fish: halibut, tuna, cod, snapper, 3 oz (480 mg).  Fish: salmon, haddock, swordfish, perch, 3 oz (300 mg).  Fish, tuna, canned 3 oz (200 mg).  [place] fries, fast food, 3 oz (470 mg).  Granola with fruit and nuts,  cup (200 mg).  Grapefruit juice,  cup (200 mg).  Greens, beet,  cup (655 mg).  Honeydew melon,  cup (200 mg).  Kale, raw, 1 cup (300 mg).  Kiwi, 1 medium (240 mg).  Kohlrabi, rutabaga, parsnips,  cup (280 mg).  Lentils,  cup (365 mg).  Mango, 1 each (325 mg).  Milk, chocolate, 1 cup (420 mg).  Milk: nonfat, low-fat, whole, buttermilk, 1 cup (350-380 mg).  Molasses, 1 Tbsp (295 mg).  Mushrooms,  cup (280) mg.  Nectarine, 1 each (275 mg).  Nuts: almonds, peanuts, hazelnuts, [place], cashew, mixed, 1 oz (200 mg).  Nuts, pistachios, 1 oz (295 mg).  Orange, 1 each (240 mg).  Orange juice,  cup (235 mg).  Papaya, medium,  fruit (390 mg).  Peanut butter, chunky, 2 Tbsp (240 mg).  Peanut butter, smooth, 2 Tbsp (210 mg).  Pear, 1 medium (200 mg).  Pomegranate, 1 whole (400 mg).  Pomegranate juice,  cup (215 mg).  Pork, 3 oz (350 mg).  Potato chips, salted, 1 oz (465 mg).  Potato, baked with skin, 1 medium (925 mg).  Potatoes, boiled,  cup (255 mg).  Potatoes, mashed,  cup (330 mg).  Prune  juice,  cup (370 mg).  Prunes, 5 each (305 mg).  Pudding, chocolate,  cup (230 mg).  Pumpkin, canned,  cup (250 mg).  Raisins, seedless,  cup (270 mg).  Seeds, sunflower or pumpkin, 1 oz (240 mg).  Soy milk, 1 cup (300 mg).  Spinach,  cup (420 mg).  Spinach, canned,  cup (370 mg).  Sweet potato, baked with skin, 1 medium (450 mg).  Swiss chard,  cup (480 mg).  Tomato or vegetable juice,  cup (275 mg).  Tomato sauce or puree,  cup (400-550 mg).  Tomato, raw, 1 medium (290 mg).  Tomatoes, canned,  cup (200-300 mg).  [place], 3 oz (250 mg).  Wheat germ, 1 oz (250 mg).  Winter squash,  cup (250 mg).  Yogurt, plain or fruited, 6 oz (260-435 mg).  Zucchini,  cup (220 mg). MODERATE IN POTASSIUM The following foods and beverages have 50-200 mg of potassium per serving:  Apple, 1 each (150 mg).  Apple juice,  cup (150 mg).  Applesauce,  cup (90 mg).  Apricot nectar,  cup (140 mg).  Asparagus, small spears,  cup or 6 spears (155 mg).  Bagel, cinnamon raisin, 1 each (130 mg).  Bagel, egg or plain, 4 in., 1 each (70 mg).  Beans, green,  cup (90 mg).  Beans, yellow,  cup (190 mg).  Beer, regular, 12 oz (100 mg).  Beets, canned,  cup (125 mg).  Blackberries,  cup (115 mg).  Blueberries,  cup (60 mg).  Bread, whole wheat, 1 slice (70 mg).  Broccoli, raw,  cup (145 mg).  Cabbage,  cup (150 mg).  Carrots, cooked or raw,  cup (180 mg).  Cauliflower, raw,  cup (150 mg).  Celery, raw,  cup (155 mg).  Cereal, bran flakes, cup (120-150 mg).  Cheese, cottage,  cup (110 mg).  Cherries, 10 each (150 mg).  Chocolate, 1 oz bar (165 mg).  Coffee, brewed 6 oz (90 mg).  Corn,  cup or 1 ear (195 mg).  Cucumbers,  cup (80 mg).  Egg, large, 1 each (60 mg).  Eggplant,  cup (60 mg).  Endive, raw, cup (80 mg).  English muffin, 1 each (65 mg).  Fish, orange roughy, 3 oz (150 mg).  Frankfurter, beef or pork, 1 each (75  mg).  Fruit cocktail,  cup (115 mg).  Grape juice,  cup (170 mg).  Grapefruit,  fruit (175 mg).  Grapes,  cup (155 mg).  Greens: kale, turnip, collard,  cup (110-150 mg).  Ice cream or frozen yogurt, chocolate,  cup (175 mg).  Ice cream or frozen yogurt, vanilla,  cup (120-150 mg).  Lemons, limes, 1 each (80 mg).  Lettuce, all types, 1 cup (100 mg).  Mixed vegetables,  cup (150 mg).  Mushrooms, raw,  cup (110 mg).  Nuts: walnuts, pecans, or macadamia, 1 oz (  125 mg).  Oatmeal,  cup (80 mg).  Okra,  cup (110 mg).  Onions, raw,  cup (120 mg).  Peach, 1 each (185 mg).  Peaches, canned,  cup (120 mg).  Pears, canned,  cup (120 mg).  Peas, green, frozen,  cup (90 mg).  Peppers, green,  cup (130 mg).  Peppers, red,  cup (160 mg).  Pineapple juice,  cup (165 mg).  Pineapple, fresh or canned,  cup (100 mg).  Plums, 1 each (105 mg).  Pudding, vanilla,  cup (150 mg).  Raspberries,  cup (90 mg).  Rhubarb,  cup (115 mg).  Rice, wild,  cup (80 mg).  Shrimp, 3 oz (155 mg).  Spinach, raw, 1 cup (170 mg).  Strawberries,  cup (125 mg).  Summer squash  cup (175-200 mg).  Swiss chard, raw, 1 cup (135 mg).  Tangerines, 1 each (140 mg).  Tea, brewed, 6 oz (65 mg).  Turnips,  cup (140 mg).  Watermelon,  cup (85 mg).  Wine, red, table, 5 oz (180 mg).  Wine, white, table, 5 oz (100 mg). LOW IN POTASSIUM The following foods and beverages have less than 50 mg of potassium per serving.  Bread, white, 1 slice (30 mg).  Carbonated beverages, 12 oz (less than 5 mg).  Cheese, 1 oz (20-30 mg).  Cranberries,  cup (45 mg).  Cranberry juice cocktail,  cup (20 mg).  Fats and oils, 1 Tbsp (less than 5 mg).  Hummus, 1 Tbsp (32 mg).  Nectar: papaya, mango, or pear,  cup (35 mg).  Rice, white or brown,  cup (50 mg).  Spaghetti or macaroni,  cup cooked (30 mg).  Tortilla, flour or corn, 1 each (50 mg).  Waffle, 4 in., 1  each (50 mg).  Water chestnuts,  cup (40 mg).   This information is not intended to replace advice given to you by your health care provider. Make sure you discuss any questions you have with your health care provider.   Document Released: 07/28/2005 Document Revised: 12/19/2013 Document Reviewed: 11/10/2013 [employer]vier Interactive Patient Education [employer].

## 2016-03-25 LAB — KAPPA/LAMBDA LIGHT CHAINS
IG LAMBDA FREE LIGHT CHAIN: 14.9 mg/L (ref 5.71–26.30)
Ig Kappa Free Light Chain: 17.21 mg/L (ref 3.30–19.40)
Kappa/Lambda FluidC Ratio: 1.16 (ref 0.26–1.65)

## 2016-03-26 DIAGNOSIS — R3911 Hesitancy of micturition: Secondary | ICD-10-CM | POA: Diagnosis not present

## 2016-03-26 LAB — PROTEIN ELECTROPHORESIS, SERUM
A/G RATIO SPE: 1.2 (ref 0.7–1.7)
Albumin: 3.5 g/dL (ref 2.9–4.4)
Alpha 1: 0.2 g/dL (ref 0.0–0.4)
Alpha 2: 1.1 g/dL — ABNORMAL HIGH (ref 0.4–1.0)
BETA: 0.9 g/dL (ref 0.7–1.3)
GAMMA GLOBULIN: 0.7 g/dL (ref 0.4–1.8)
Globulin, Total: 2.9 g/dL (ref 2.2–3.9)

## 2016-03-27 LAB — IMMUNOFIXATION ELECTROPHORESIS
IGG (IMMUNOGLOBIN G), SERUM: 563 mg/dL — AB (ref 700–1600)
IGM (IMMUNOGLOBIN M), SRM: 15 mg/dL — AB (ref 20–172)
IgA, Qn, Serum: 6 mg/dL — ABNORMAL LOW (ref 61–437)
Total Protein: 6.4 g/dL (ref 6.0–8.5)

## 2016-04-02 DIAGNOSIS — N200 Calculus of kidney: Secondary | ICD-10-CM | POA: Diagnosis not present

## 2016-04-21 ENCOUNTER — Ambulatory Visit (HOSPITAL_BASED_OUTPATIENT_CLINIC_OR_DEPARTMENT_OTHER): Payer: Medicare Other | Admitting: Hematology

## 2016-04-21 ENCOUNTER — Ambulatory Visit (HOSPITAL_BASED_OUTPATIENT_CLINIC_OR_DEPARTMENT_OTHER): Payer: Medicare Other

## 2016-04-21 ENCOUNTER — Encounter: Payer: Self-pay | Admitting: Hematology

## 2016-04-21 ENCOUNTER — Other Ambulatory Visit (HOSPITAL_BASED_OUTPATIENT_CLINIC_OR_DEPARTMENT_OTHER): Payer: Medicare Other

## 2016-04-21 ENCOUNTER — Telehealth: Payer: Self-pay | Admitting: Hematology

## 2016-04-21 VITALS — BP 117/60 | HR 58 | Temp 98.0°F | Resp 18 | Ht 68.5 in | Wt 146.8 lb

## 2016-04-21 DIAGNOSIS — C9001 Multiple myeloma in remission: Secondary | ICD-10-CM | POA: Diagnosis not present

## 2016-04-21 DIAGNOSIS — I428 Other cardiomyopathies: Secondary | ICD-10-CM

## 2016-04-21 DIAGNOSIS — E876 Hypokalemia: Secondary | ICD-10-CM | POA: Diagnosis not present

## 2016-04-21 DIAGNOSIS — IMO0001 Reserved for inherently not codable concepts without codable children: Secondary | ICD-10-CM

## 2016-04-21 DIAGNOSIS — C9000 Multiple myeloma not having achieved remission: Secondary | ICD-10-CM

## 2016-04-21 DIAGNOSIS — M4850XD Collapsed vertebra, not elsewhere classified, site unspecified, subsequent encounter for fracture with routine healing: Secondary | ICD-10-CM

## 2016-04-21 DIAGNOSIS — I10 Essential (primary) hypertension: Secondary | ICD-10-CM

## 2016-04-21 DIAGNOSIS — G620 Drug-induced polyneuropathy: Secondary | ICD-10-CM | POA: Diagnosis not present

## 2016-04-21 DIAGNOSIS — N182 Chronic kidney disease, stage 2 (mild): Secondary | ICD-10-CM

## 2016-04-21 LAB — COMPREHENSIVE METABOLIC PANEL
ALT: 115 U/L — ABNORMAL HIGH (ref 0–55)
ANION GAP: 7 meq/L (ref 3–11)
AST: 82 U/L — ABNORMAL HIGH (ref 5–34)
Albumin: 3.7 g/dL (ref 3.5–5.0)
Alkaline Phosphatase: 129 U/L (ref 40–150)
BUN: 5.1 mg/dL — ABNORMAL LOW (ref 7.0–26.0)
CALCIUM: 9.4 mg/dL (ref 8.4–10.4)
CHLORIDE: 109 meq/L (ref 98–109)
CO2: 24 meq/L (ref 22–29)
CREATININE: 1.1 mg/dL (ref 0.7–1.3)
EGFR: 73 mL/min/{1.73_m2} — AB (ref >90)
Glucose: 75 mg/dl (ref 70–140)
POTASSIUM: 3.3 meq/L — AB (ref 3.5–5.1)
Sodium: 140 mEq/L (ref 136–145)
Total Bilirubin: 0.49 mg/dL (ref 0.20–1.20)
Total Protein: 6.8 g/dL (ref 6.4–8.3)

## 2016-04-21 LAB — CBC WITH DIFFERENTIAL/PLATELET
BASO%: 0.5 % (ref 0.0–2.0)
BASOS ABS: 0 10*3/uL (ref 0.0–0.1)
EOS%: 9 % — ABNORMAL HIGH (ref 0.0–7.0)
Eosinophils Absolute: 0.4 10*3/uL (ref 0.0–0.5)
HEMATOCRIT: 50.6 % — AB (ref 38.4–49.9)
HGB: 16.5 g/dL (ref 13.0–17.1)
LYMPH#: 0.7 10*3/uL — AB (ref 0.9–3.3)
LYMPH%: 15.4 % (ref 14.0–49.0)
MCH: 34.7 pg — AB (ref 27.2–33.4)
MCHC: 32.5 g/dL (ref 32.0–36.0)
MCV: 106.5 fL — ABNORMAL HIGH (ref 79.3–98.0)
MONO#: 1 10*3/uL — ABNORMAL HIGH (ref 0.1–0.9)
MONO%: 22 % — ABNORMAL HIGH (ref 0.0–14.0)
NEUT#: 2.4 10*3/uL (ref 1.5–6.5)
NEUT%: 53.1 % (ref 39.0–75.0)
PLATELETS: 181 10*3/uL (ref 140–400)
RBC: 4.75 10*6/uL (ref 4.20–5.82)
RDW: 15.7 % — ABNORMAL HIGH (ref 11.0–14.6)
WBC: 4.4 10*3/uL (ref 4.0–10.3)

## 2016-04-21 MED ORDER — SODIUM CHLORIDE 0.9 % IV SOLN
Freq: Once | INTRAVENOUS | Status: AC
  Administered 2016-04-21: 12:00:00 via INTRAVENOUS

## 2016-04-21 MED ORDER — ZOLEDRONIC ACID 4 MG/100ML IV SOLN
4.0000 mg | Freq: Once | INTRAVENOUS | Status: AC
  Administered 2016-04-21: 4 mg via INTRAVENOUS
  Filled 2016-04-21: qty 100

## 2016-04-21 NOTE — Progress Notes (Signed)
At 1145 patient removed band aid from left forearm, small skin tear noted after patient removed. Cleansed with normal saline, gauze and coban applied.

## 2016-04-21 NOTE — Telephone Encounter (Signed)
Pt will p/u sched in tx room °

## 2016-04-21 NOTE — Progress Notes (Signed)
Eric Dudley  [phone] [phone]  Clinic Follow Up Note   Patient Care Team: Eric Seashore, MD as PCP - General (Internal Medicine) Eric Casino, MD as Consulting Physician (Cardiology) Eric Merle, MD as Consulting Physician (Hematology) 04/21/2016   CHIEF COMPLAINTS: Follow-up multiple myeloma    Multiple myeloma in remission ([place])   01/31/2015 Tumor Marker SPEP showed M-SPIKE 1.7g/dl, Cr 1.5, no anemia, hypercalcemia   03/19/2015 Imaging Bone survey showed no discrete lytic lesion, but there is osteopenia and calvarial heterogeneity.   03/27/2015 Initial Diagnosis Multiple myeloma, stage I   03/27/2015 Bone Marrow Biopsy Bone Marrow, Aspirate,Biopsy: - HYPERCELLULAR BONE MARROW FOR AGE WITH PLASMA CELL NEOPLASM 47% - SEE COMMENT. PERIPHERAL BLOOD: - NO SIGNIFICANT MORPHOLOGIC ABNORMALITIES. Diagnosis Note The bone marrow shows increased number of    03/27/2015 Miscellaneous bone marrow Cytogenetics: normal. FISH panel showed the presence of +4 and +11, this is consider standard risk of MM    04/16/2015 - 04/23/2015 Hospital Admission He was admitted for worsening back pain, was found to have a T12 pathological fracture. He underwent kyphoplasty on 04/19/2015.   04/19/2015 - 08/02/2015 Chemotherapy RVD with weekly Velcade 1.3 mg/m, dexamethasone 40 mg, and Revlimid 10 mg daily on day 1-21, every 28 days, cycle 1 Revlimid was interupted by hospitalization, cycle 3 Revlimid dose increased to 25 mg daily due to his improved renal function. total 4 cyc   04/26/2015 - 05/01/2015 Hospital Admission He was admitted to [facility] for syncope episode during his radiation simulation. EKG showed ST elevation, troponin was positive, he underwent cardio catheterization which showed mild stenosis. No intervention was needed. echo showed EF 35-40%   04/29/2015 - 05/10/2015 Radiation Therapy palliative RT to T12, 20Gy in 10 fractions   08/19/2015 Bone Marrow Transplant He  underwent autologous stem cell transplant at [facility].    11/27/2015 Bone Marrow Biopsy hypocellular marrow (10%), no increased plasma cells    12/29/2015 -  Chemotherapy maintenance Revlimid 68m daily, 3 weeks on, one week off      HISTORY OF PRESENTING ILLNESS (03/15/2015):  Eric Gitelman65y.[id number]. 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  He has been having low back pain for 3-4 months. He had food posinin 4 month ago, and had frequent diarrhea. He started noticed low back pain since then. The pain is not radiating to leg, he also has intermittent right rib pain, worse with cough and sneezing. He remains to be physically active, able to do all the activities, such as gardening work housework without much limitation, but he doesn't sings slowly because of back pain. He takes Tylenol as needed, does not like the necrotic pain medication. No night sweats, no fever or chillss, no weight loss.   He was seen by his primary care physician Dr. RMerrilee Seashore MD and had lab test (see below). He also had bone density scan 2/25 which showed osteoporosis, has not been treated yet.  Current therapy: Maintenance Revlimid 5 mg daily, 3 weeks on, one-week off, started on 12/29/2015  INTERIM HISTORY: Eric Dudley for follow-up. He came in by himself today. He is doing well overall. He has been more physically active lately, does a lot of yard work, fishing, and housework. He reports intermittent right-sided rib/chest pain and a shooting pain in the midback, especially after physical activities. It does resolve on its  own. No other new complaints.  MEDICAL HISTORY:  Past Medical History  Diagnosis Date  . Hypertension   . Dyslipidemia   . 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 Cholelithiases   . Hyperlipidemia   . Chronic kidney disease     MILD,CHRONIC    SURGICAL  HISTORY: Past Surgical History  Procedure Laterality Date  . Appendectomy  1962  . Shoulder surgery  1999    left  . Knee surgery  2012    right  . Cardiac catheterization N/A 04/30/2015    Procedure: Left Heart Cath and Coronary Angiography;  Surgeon: Eric M Martinique, MD;  Location: [facility] INVASIVE CV LAB CUPID;  Service: Cardiovascular;  Laterality: N/A;  . Cardiolite myocardial perfusion study  04/16/03    NEGITIVE BRUCE PROTOCAL EXERCISE STRESS TEST. EF 70%. NO ISCHEMIA.    SOCIAL HISTORY: History   Social History  . Marital Status: Married    Spouse Name: N/A  . Number of Children: 1   . Years of Education: N/A   Occupational History  . A retired Therapist, occupational    Social History Main Topics  . Smoking status: Former Smoker    Types: Cigars    Quit date: 12/29/2003  . Smokeless tobacco: Former Systems developer    Types: Chew    Quit date: 12/28/1994  . Alcohol Use: Yes     Comment: occasional beer  . Drug Use: No  . Sexual Activity: Not on file   Other Topics Concern  . Not on file   Social History Narrative  . No narrative on file    FAMILY HISTORY: Family History  Problem Relation Age of Onset  . Cancer Mother 27    cervical cancer   . Cancer Father     unknown cancer   . Hypertension Sister   . Cancer Sister     melanoma     ALLERGIES:  is allergic to hydrochlorothiazide; asa; calcium carbonate; oxycodone; amoxicillin; and indapamide.  MEDICATIONS:  Current Outpatient Prescriptions  Medication Sig Dispense Refill  . acyclovir (ZOVIRAX) 400 MG tablet Take 1 tablet (400 mg total) by mouth 2 (two) times daily. 180 tablet 1  . aspirin 81 MG chewable tablet Chew 1 tablet (81 mg total) by mouth daily.    . calcium carbonate (OS-CAL) 600 MG TABS tablet Take 1,200 mg by mouth daily with breakfast.    . clopidogrel (PLAVIX) 75 MG tablet Take 1 tablet (75 mg total) by mouth daily. 90 tablet 3  . diphenhydrAMINE (BENADRYL) 25 mg capsule Take 25 mg by mouth at bedtime as  needed.    . ezetimibe-simvastatin (VYTORIN) 10-40 MG per tablet Take 1 tablet by mouth at bedtime.    [name] guaiFENesin (MUCINEX) 600 MG 12 hr tablet Take by mouth every evening.    [name] lenalidomide (REVLIMID) 5 MG capsule Take 1 capsule (5 mg total) by mouth daily. Take Revlimid  5 mg by mouth daily  For  21 days ,  Rest  7 days. 21 capsule 0  . lidocaine (LIDODERM) 5 % Place 1 patch onto the skin daily. Remove & Discard patch within 12 hours or as directed by MD 30 patch 1  . loperamide (IMODIUM) 2 MG capsule Take 2 mg by mouth 4 (four) times daily as needed. Reported on 02/17/2016    . midodrine (PROAMATINE) 10 MG tablet Take 1 tablet (10 mg total) by mouth 3 (three) times daily. 270 tablet 3  . Multiple Vitamins-Minerals (CENTRUM SILVER ULTRA MENS PO) Take 1  tablet by mouth daily.    . nitroGLYCERIN (NITROSTAT) 0.4 MG SL tablet Place 0.4 mg under the tongue every 5 (five) minutes x 3 doses as needed.    . ondansetron (ZOFRAN) 4 MG tablet Take 1 tablet (4 mg total) by mouth 4 (four) times daily as needed for nausea or vomiting. 30 tablet 2  . polyethylene glycol (MIRALAX / GLYCOLAX) packet Take 17 g by mouth daily as needed.     . ranitidine (ZANTAC) 150 MG tablet Take 2 tablets (300 mg total) by mouth at bedtime. 60 tablet 1  . traMADol (ULTRAM) 50 MG tablet Take 2 tablets (100 mg total) by mouth every 6 (six) hours as needed. 60 tablet 1   No current facility-administered medications for this visit.    REVIEW OF SYSTEMS:   Constitutional: Denies fevers, chills or abnormal night sweats Eyes: Denies blurriness of vision, double vision or watery eyes Ears, nose, mouth, throat, and face: Denies mucositis or sore throat Respiratory: Denies cough, dyspnea or wheezes Cardiovascular: Denies palpitation, chest discomfort or lower extremity swelling Gastrointestinal:  Denies nausea, heartburn or change in bowel habits Skin: Denies abnormal skin rashes Lymphatics: Denies new lymphadenopathy or easy  bruising Neurological:Denies numbness, tingling or new weaknesses Behavioral/Psych: Mood is stable, no new changes  Musculoskeletal: Positive for back pain and intermittent rib pain All other systems were reviewed with the patient and are negative.  PHYSICAL EXAMINATION: ECOG PERFORMANCE STATUS: 1 BP 117/60 mmHg  Pulse 58  Temp(Src) 98 F (36.7 C) (Oral)  Resp 18  Ht 5' 8.5" (1.74 m)  Wt 146 lb 12.8 oz (66.588 kg)  BMI 21.99 kg/m2  SpO2 100%  GENERAL: He comes in today with a wheelchair, alert and oriented, no distress, chronically ill-appearing SKIN: skin color, texture, turgor are normal, no rashes or significant lesions except to some healing rash on his hands EYES: normal, conjunctiva are pink and non-injected, sclera clear OROPHARYNX:no exudate, no erythema and lips, buccal mucosa, and tongue normal  NECK: supple, thyroid normal size, non-tender, without nodularity LYMPH:  no palpable lymphadenopathy in the cervical, axillary or inguinal LUNGS: clear to auscultation and percussion with normal breathing effort HEART: regular rate & rhythm and no murmurs and no lower extremity edema ABDOMEN:abdomen soft, non-tender and normal bowel sounds Musculoskeletal:no cyanosis of digits and no clubbing, mild tenderness on bilateral lateral chest wall.  PSYCH: alert & oriented x 3 with fluent speech NEURO: no focal motor/sensory deficits CHEST WALL: slightly enlarged left breast, mild tenderness, no palpable mass. He states his left breast is slightly bigger than right for all his adult life.  LABORATORY DATA:   CBC Latest Ref Rng 04/21/2016 03/24/2016 02/28/2016  WBC 4.0 - 10.3 10e3/uL 4.4 4.8 4.0  Hemoglobin 13.0 - 17.1 g/dL 16.5 15.9 15.6  Hematocrit 38.4 - 49.9 % 50.6(H) 47.6 46.8  Platelets 140 - 400 10e3/uL 181 193 264   CMP Latest Ref Rng 04/21/2016 03/24/2016 03/24/2016  Glucose 70 - 140 mg/dl 75 95 -  BUN 7.0 - 26.0 mg/dL 5.1(L) 5.0(L) -  Creatinine 0.7 - 1.3 mg/dL 1.1 1.1 -    Sodium 136 - 145 mEq/L 140 137 -  Potassium 3.5 - 5.1 mEq/L 3.3(L) 3.3(L) -  CO2 22 - 29 mEq/L 24 19(L) -  Calcium 8.4 - 10.4 mg/dL 9.4 8.8 -  Total Protein 6.4 - 8.3 g/dL 6.8 6.8 6.4  Total Bilirubin 0.20 - 1.20 mg/dL 0.49 0.78 -  Alkaline Phos 40 - 150 U/L 129 120 -  AST 5 -  34 U/L 82(H) 38(H) -  ALT 0 - 55 U/L 115(H) 75(H) -   MM lab: SPEP M-protein (g/dl) 03/15/2015: 1.6 05/24/2015: 0.5 06/21/2015: 0.4 07/18/2015 (at [facility]): 0.33 11/27/2015: not det 01/21/2016: not det  03/24/2016: not det  Serum IgG (mg/dl) 03/15/2015: 2000 05/24/2015: 579 06/21/2015: 673 07/18/2015 ([facility]): 509 11/27/2015: 396 01/21/2016: 487 03/24/2016: 563  Serum kappa/lamda/ratio (mg/dl)  03/15/2015: 690, 0.13, 5308 05/24/2015: 146, 1.27, 115.0 06/21/2015: 86, 0.74, 116.2 01/21/2016: 1.09, 1.21, 0.9 03/24/2016: 1.72, 1.49, 1.16  24 h urine kappa light chain (mg) 03/18/2015: 2434 05/28/2015: 582 06/24/2015: 322   PATHOLOGY REPORT Bone Marrow, Aspirate,Biopsy, and Clot, right iliac 03/27/2015 - HYPERCELLULAR BONE MARROW FOR AGE WITH PLASMA CELL NEOPLASM. - SEE COMMENT. PERIPHERAL BLOOD: - NO SIGNIFICANT MORPHOLOGIC ABNORMALITIES. Diagnosis Note The bone marrow shows increased number of atypical plasma cells representing 47% of all cells in the aspirate associated with prominent interstitial infiltrates and variably sized aggregates in the clot and biopsy sections. Immunohistochemical stains show that the plasma cells are kappa light chain restricted consistent with plasma cell neoplasm. The background shows trilineage  Cytogenetics: normal FISH panel: +4 and +11  Bone Marrow, Aspirate,Biopsy, and Clot, 11/27/2015 at [place]: [id number] RECEIVED: 11/27/2015 ORDERING PHYSICIAN: CESAR Melburn Hake , MD PATIENT NAME: Eric Dudley BONE MARROW REPORT   Bone Marrow (BM) and Peripheral Blood (PB) FINAL PATHOLOGIC DIAGNOSIS  BONE MARROW: Hypocellular marrow (10%) with no  increase in plasma cells. See comment.  PERIPHERAL BLOOD: Absolute lymphopenia. See CBC data.  COMMENT: The touch prep is cellular and adequate for evaluation. Rare plasma cells are present (<1%). Blasts are not increased, Myeloid and erythroid cells are present in normal proportions with intact maturation and no overt dysplastic changes. Megakaryocytes are absent. The aspirate smears are aspicular and hemodilute.  Bone marrow and clot sections are variably hypocellular (0-20% range, 10% overall). Scattered interstitial plasma cells are present and comprise <5% of marrow cellularity by CD138 immunohistochemical analysis. Blasts are not increased. Myeloid and erythroid cells are present in normal proportions and have no overt morphologic abnormalities. Megakaryocytes are morphologically within normal limits.  Examination of the peripheral blood reveals absolute lymphopenia. There are no circulating plasma cells or evidence of rouleaux formation.   RADIOGRAPHIC STUDIES: CT anginal chest 09/30/2015 IMPRESSION: 1. No pulmonary embolism. 2. No thoracic aortic aneurysm or dissection. 3. Heart size is normal. No pericardial effusion. 4. Mild scarring/atelectasis at each lung base, probably chronic. Lungs otherwise clear. No evidence of pneumonia. No pleural effusion. 5. Evidence of diffuse multiple myeloma involvement throughout the thoracolumbar spine and multiple ribs bilaterally, with areas of present clinical relevance described below. 6. Compression fracture deformity of the T12 vertebral body, known pathologic fracture, status post treatment with vertebroplasty on 04/19/2015. 7. Milder compression deformity of the overlying T11 vertebral body, approximately 40% compressed anteriorly, also presumably pathologic. This vertebral body appeared essentially normal on fluoroscopic images from the earlier aforementioned vertebroplasty of 04/19/2015. 8. Slight irregularity of the  right lateral sixth rib, with subtle cortical sclerosis suggesting nondisplaced healing fracture. This may be a relatively acute fracture and is probably pathologic in nature related to underlying multiple myeloma. 9. Similar irregularity of the left lateral fifth rib, suggesting minimally displaced healing fracture. This may also be a relatively acute fracture and is likely pathologic in nature related to underlying multiple myeloma. These acute or subacute rib fractures are the most likely source of patient's bilateral chest pain. Additional incidental findings in the upper abdomen: Fatty infiltration of liver, cholelithiasis  without evidence of acute cholecystitis, fairly large amount of stool within the nondistended colon of the upper abdomen (constipation? ).   ASSESSMENT & PLAN:  65 year old Caucasian male with past medical history of hypertension, tonsil cancer status post surgery and radiation 10 years ago, now presented with 3-4 months low back pain and intermittent rib pain. His blood test showed M protein 1.7g/dl.  1. IgG multiple myeloma, stage I, standard risk by cytogenetics (+4 and +11), in remission  -I previously discussed his initial bone marrow biopsy results with him. He has significant amount of plasma cells in the bone marrow 47%.  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 Based on the new notable myeloma diagnostic criteria, he meets the criteria of multiple myeloma. He has normal albumin and miildly elevated beta 2 microglobulin, this is stage I. -His cytogenetics and Fish panel revealed standard risk. -His thoracic and lumbar spine MRI showed pathologic compression fracture at T12, focal myeloma deposits at L3 and T9,  diffuse marrow signal abnormality consistent with myeloma -I recommended induction chemotherapy with VRD regimen, weekly Velcade and dexamethasone, first 2 cycles Revlimid dose reduced to 10 mg daily 3 week on, one-week off, based on his renal function, and it which was increased to full dose from cycle 3. He received a total of 4 cycles of treatment, achieved a very good partial response. Repeat bone marrow on 07/18/2015 showed 5% plasma cells, fish and cytogenetics was normal. -His post transplant evaluation showed complete remission (<1% plasma cell in marrow, undetectable M protein), and he has recovered very well. -He has started maintenance Revlimid 5 mg daily, 3 weeks on, 1 week off, tolerating well, we'll continue. -lab results reviewed with him, stable mild transaminitis, his M protein was not detectable 1 months ago, we'll continue Revlimid, he will start next cycle in 1 week. -Continue monitoring CBC and CMP every 4 weeks, multiple myeloma labs every 2 months  2.  Transaminitis - he has had elevated ALT and AST, normal bilirubin possible related to medications -overall mild and stable , we'll continue monitoring  3. T12 pathologic compression fracture, and diffuse myeloma involvement in bones -Status post kyphoplasty and palliative radiation  -He received palliative Radiation, pain much improved overall. He does complain more mid back and right-sided chest pain daily, likely related to his physical activities. -continue zometa infusion monthly, and changed to every 3 months after one year of therapy  4. nonobstructive CAD, STEMI, Non ischemic CM, Takatsubo, with EF of 35-40% -Patient was initially found to have troponin elevation and anterior apical ST elevation MI and resultant cardiomyopathy on 04/26/15 -He underwent a cardiac catheterization on 5/3 which showed mild diffuse disease, no stents were placed. He is on aspirin -follow up with Dr. Debara Pickett  -Repeat echo before transplant  showed EF 55-60%  5. Peripheral neuropathy, grade 1 -Likely secondary to his bone marrow transplant chemotherapy -Mild and Stable overall, we'll continue observation  6.  Hypokalemia - potassium 3.3 today, he will continue potassium 3 tab daily. -I encouraged him to eat potassium enriched food -we'll continue monitoring  7. Advanced directives -his advanced directives is in EPIC -he is full code.   Plan -lab results reviewed with patient, he will start next cycle of Revlimid on 04/29/2016, refill was called  in today  -Zometa infusion today and monthly  -I encouraged him to use Tylenol or heating pad for right-sided chest and mid back pain -I'll see him back in 4 weeks   All questions were answered. The patient knows to call the clinic with any problems, questions or concerns.  I spent 20 minutes counseling the patient face to face. The total time spent in the appointment was 30 minutes and more than 50% was on counseling.     Eric Merle, MD 04/21/2016

## 2016-04-21 NOTE — Patient Instructions (Signed)

## 2016-04-22 ENCOUNTER — Other Ambulatory Visit: Payer: Self-pay | Admitting: *Deleted

## 2016-04-22 DIAGNOSIS — C9001 Multiple myeloma in remission: Secondary | ICD-10-CM

## 2016-04-22 MED ORDER — LENALIDOMIDE 5 MG PO CAPS: 5.0000 mg | ORAL_CAPSULE | Freq: Every day | ORAL | Status: DC

## 2016-04-27 ENCOUNTER — Telehealth: Payer: Self-pay | Admitting: *Deleted

## 2016-04-27 DIAGNOSIS — C9001 Multiple myeloma in remission: Secondary | ICD-10-CM | POA: Diagnosis not present

## 2016-04-27 NOTE — Telephone Encounter (Signed)
"  I am to restart Revlimid on Wednesday.  The Pharmacy has not yet received the refill order."  This nurse reviewed medication list.  Refill was FAXED on 04-22-2016. confirmed [facility] as the specialty pharmacy.   Advised Fax takes longer bit order has been sent.  Eric Dudley says he will place a second call to [place].

## 2016-04-29 DIAGNOSIS — Z23 Encounter for immunization: Secondary | ICD-10-CM | POA: Diagnosis not present

## 2016-04-29 DIAGNOSIS — Z9484 Stem cells transplant status: Secondary | ICD-10-CM | POA: Diagnosis not present

## 2016-04-29 DIAGNOSIS — C9000 Multiple myeloma not having achieved remission: Secondary | ICD-10-CM | POA: Diagnosis not present

## 2016-04-29 LAB — UPEP/UIFE/LIGHT CHAINS/TP, 24-HR UR
% BETA, Urine: 23.1 %
ALBUMIN, U: 29.6 %
ALPHA 1 URINE: 7.5 %
ALPHA-2-GLOBULIN, U: 29.3 %
FREE KAPPA LT CHAINS, UR: 217 mg/L — AB (ref 1.35–24.19)
Free Lambda Lt Chains,Ur: 29.6 mg/L — ABNORMAL HIGH (ref 0.24–6.66)
GAMMA GLOBULIN URINE: 10.5 %
KAPPA/LAMBDA RATIO, U: 7.33 (ref 2.04–10.37)
PROTEIN,TOTAL,URINE: 43.7 mg/dL

## 2016-05-03 ENCOUNTER — Other Ambulatory Visit: Payer: Self-pay | Admitting: Internal Medicine

## 2016-05-04 NOTE — Telephone Encounter (Signed)
Rx Refill

## 2016-05-19 ENCOUNTER — Other Ambulatory Visit: Payer: Self-pay | Admitting: *Deleted

## 2016-05-19 ENCOUNTER — Other Ambulatory Visit (HOSPITAL_BASED_OUTPATIENT_CLINIC_OR_DEPARTMENT_OTHER): Payer: Medicare Other

## 2016-05-19 ENCOUNTER — Telehealth: Payer: Self-pay | Admitting: *Deleted

## 2016-05-19 ENCOUNTER — Ambulatory Visit (HOSPITAL_BASED_OUTPATIENT_CLINIC_OR_DEPARTMENT_OTHER): Payer: Medicare Other | Admitting: Hematology

## 2016-05-19 ENCOUNTER — Ambulatory Visit (HOSPITAL_BASED_OUTPATIENT_CLINIC_OR_DEPARTMENT_OTHER): Payer: Medicare Other

## 2016-05-19 ENCOUNTER — Encounter: Payer: Self-pay | Admitting: Hematology

## 2016-05-19 ENCOUNTER — Telehealth: Payer: Self-pay | Admitting: Hematology

## 2016-05-19 VITALS — BP 115/55 | HR 55 | Temp 98.0°F | Resp 17 | Wt 150.1 lb

## 2016-05-19 DIAGNOSIS — G893 Neoplasm related pain (acute) (chronic): Secondary | ICD-10-CM | POA: Diagnosis not present

## 2016-05-19 DIAGNOSIS — C9001 Multiple myeloma in remission: Secondary | ICD-10-CM | POA: Diagnosis not present

## 2016-05-19 DIAGNOSIS — C9000 Multiple myeloma not having achieved remission: Secondary | ICD-10-CM

## 2016-05-19 DIAGNOSIS — E876 Hypokalemia: Secondary | ICD-10-CM

## 2016-05-19 DIAGNOSIS — M4850XD Collapsed vertebra, not elsewhere classified, site unspecified, subsequent encounter for fracture with routine healing: Secondary | ICD-10-CM

## 2016-05-19 DIAGNOSIS — I10 Essential (primary) hypertension: Secondary | ICD-10-CM

## 2016-05-19 DIAGNOSIS — G620 Drug-induced polyneuropathy: Secondary | ICD-10-CM

## 2016-05-19 DIAGNOSIS — N182 Chronic kidney disease, stage 2 (mild): Secondary | ICD-10-CM

## 2016-05-19 DIAGNOSIS — IMO0001 Reserved for inherently not codable concepts without codable children: Secondary | ICD-10-CM

## 2016-05-19 DIAGNOSIS — I428 Other cardiomyopathies: Secondary | ICD-10-CM

## 2016-05-19 LAB — CBC WITH DIFFERENTIAL/PLATELET
BASO%: 1 % (ref 0.0–2.0)
Basophils Absolute: 0 10*3/uL (ref 0.0–0.1)
EOS%: 13.7 % — ABNORMAL HIGH (ref 0.0–7.0)
Eosinophils Absolute: 0.6 10*3/uL — ABNORMAL HIGH (ref 0.0–0.5)
HCT: 47.2 % (ref 38.4–49.9)
HEMOGLOBIN: 15.8 g/dL (ref 13.0–17.1)
LYMPH%: 14.5 % (ref 14.0–49.0)
MCH: 35.7 pg — ABNORMAL HIGH (ref 27.2–33.4)
MCHC: 33.5 g/dL (ref 32.0–36.0)
MCV: 106.7 fL — ABNORMAL HIGH (ref 79.3–98.0)
MONO#: 0.7 10*3/uL (ref 0.1–0.9)
MONO%: 16.5 % — AB (ref 0.0–14.0)
NEUT%: 54.3 % (ref 39.0–75.0)
NEUTROS ABS: 2.3 10*3/uL (ref 1.5–6.5)
Platelets: 164 10*3/uL (ref 140–400)
RBC: 4.43 10*6/uL (ref 4.20–5.82)
RDW: 15.9 % — AB (ref 11.0–14.6)
WBC: 4.2 10*3/uL (ref 4.0–10.3)
lymph#: 0.6 10*3/uL — ABNORMAL LOW (ref 0.9–3.3)

## 2016-05-19 LAB — COMPREHENSIVE METABOLIC PANEL
ALT: 74 U/L — AB (ref 0–55)
AST: 50 U/L — ABNORMAL HIGH (ref 5–34)
Albumin: 3.8 g/dL (ref 3.5–5.0)
Alkaline Phosphatase: 106 U/L (ref 40–150)
Anion Gap: 8 mEq/L (ref 3–11)
BILIRUBIN TOTAL: 0.58 mg/dL (ref 0.20–1.20)
BUN: 6.7 mg/dL — AB (ref 7.0–26.0)
CO2: 18 meq/L — AB (ref 22–29)
CREATININE: 1.1 mg/dL (ref 0.7–1.3)
Calcium: 8.9 mg/dL (ref 8.4–10.4)
Chloride: 112 mEq/L — ABNORMAL HIGH (ref 98–109)
EGFR: 68 mL/min/{1.73_m2} — ABNORMAL LOW (ref >90)
GLUCOSE: 98 mg/dL (ref 70–140)
Potassium: 3.4 mEq/L — ABNORMAL LOW (ref 3.5–5.1)
SODIUM: 138 meq/L (ref 136–145)
TOTAL PROTEIN: 6.8 g/dL (ref 6.4–8.3)

## 2016-05-19 MED ORDER — ALTEPLASE 2 MG IJ SOLR
2.0000 mg | Freq: Once | INTRAMUSCULAR | Status: DC | PRN
  Filled 2016-05-19: qty 2

## 2016-05-19 MED ORDER — ZOLEDRONIC ACID 4 MG/100ML IV SOLN
4.0000 mg | Freq: Once | INTRAVENOUS | Status: AC
  Administered 2016-05-19: 4 mg via INTRAVENOUS
  Filled 2016-05-19: qty 100

## 2016-05-19 MED ORDER — ONDANSETRON HCL 4 MG PO TABS: 4.0000 mg | ORAL_TABLET | Freq: Four times a day (QID) | ORAL | Status: DC | PRN

## 2016-05-19 MED ORDER — SODIUM CHLORIDE 0.9 % IV SOLN
Freq: Once | INTRAVENOUS | Status: AC
  Administered 2016-05-19: 11:00:00 via INTRAVENOUS

## 2016-05-19 MED ORDER — LENALIDOMIDE 5 MG PO CAPS: 5.0000 mg | ORAL_CAPSULE | Freq: Every day | ORAL | Status: DC

## 2016-05-19 NOTE — Progress Notes (Signed)
Eric Dudley  [phone] [phone]  Clinic Follow Up Note   Patient Care Team: Eric Seashore, MD as PCP - General (Internal Medicine) Eric Casino, MD as Consulting Physician (Cardiology) Eric Merle, MD as Consulting Physician (Hematology) 05/19/2016   CHIEF COMPLAINTS: Follow-up multiple myeloma    Multiple myeloma in remission ([place])   01/31/2015 Tumor Marker SPEP showed Eric-SPIKE 1.7g/dl, Cr 1.5, no anemia, hypercalcemia   03/19/2015 Imaging Bone survey showed no discrete lytic lesion, but there is osteopenia and calvarial heterogeneity.   03/27/2015 Initial Diagnosis Multiple myeloma, stage I   03/27/2015 Bone Marrow Biopsy Bone Marrow, Aspirate,Biopsy: - HYPERCELLULAR BONE MARROW FOR AGE WITH PLASMA CELL NEOPLASM 47% - SEE COMMENT. PERIPHERAL BLOOD: - NO SIGNIFICANT MORPHOLOGIC ABNORMALITIES. Diagnosis Note The bone marrow shows increased number of    03/27/2015 Miscellaneous bone marrow Cytogenetics: normal. FISH panel showed the presence of +4 and +11, this is consider standard risk of MM    04/16/2015 - 04/23/2015 Hospital Admission He was admitted for worsening back pain, was found to have a T12 pathological fracture. He underwent kyphoplasty on 04/19/2015.   04/19/2015 - 08/02/2015 Chemotherapy RVD with weekly Velcade 1.3 mg/Eric, dexamethasone 40 mg, and Revlimid 10 mg daily on day 1-21, every 28 days, cycle 1 Revlimid was interupted by hospitalization, cycle 3 Revlimid dose increased to 25 mg daily due to his improved renal function. total 4 cyc   04/26/2015 - 05/01/2015 Hospital Admission He was admitted to [facility] for syncope episode during his radiation simulation. EKG showed ST elevation, troponin was positive, he underwent cardio catheterization which showed mild stenosis. No intervention was needed. echo showed EF 35-40%   04/29/2015 - 05/10/2015 Radiation Therapy palliative RT to T12, 20Gy in 10 fractions   08/19/2015 Bone Marrow Transplant He  underwent autologous stem cell transplant at [facility]    11/27/2015 Bone Marrow Biopsy hypocellular marrow (10%), no increased plasma cells    12/29/2015 -  Chemotherapy maintenance Revlimid 65m daily, 3 weeks on, one week off      HISTORY OF PRESENTING ILLNESS (03/15/2015):  Eric Gitelman65y.[id number]. 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  He has been having low back pain for 3-4 months. He had food posinin 4 month ago, and had frequent diarrhea. He started noticed low back pain since then. The pain is not radiating to leg, he also has intermittent right rib pain, worse with cough and sneezing. He remains to be physically active, able to do all the activities, such as gardening work housework without much limitation, but he doesn't sings slowly because of back pain. He takes Tylenol as needed, does not like the necrotic pain medication. No night sweats, no fever or chillss, no weight loss.   He was seen by his primary care physician Dr. RMerrilee Seashore MD and had lab test (see below). He also had bone density scan 2/25 which showed osteoporosis, has not been treated yet.  Current therapy: Maintenance Revlimid 5 mg daily, 3 weeks on, one-week off, started on 12/29/2015  INTERIM HISTORY: Mr. TCarelockreturns for follow-up. He came in by himself today. He is doing well overall. His right side chest pain has overall improved, it's intermittent, does not limit his daily activities. He remains to be physically active, has good appetite and energy level. No other complains.   MEDICAL HISTORY:  Past Medical  History  Diagnosis Date  . Hypertension   . Dyslipidemia   . 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 Cholelithiases   . Hyperlipidemia   . Chronic kidney disease     MILD,CHRONIC    SURGICAL HISTORY: Past Surgical History  Procedure Laterality Date  .  Appendectomy  1962  . Shoulder surgery  1999    left  . Knee surgery  2012    right  . Cardiac catheterization N/A 04/30/2015    Procedure: Left Heart Cath and Coronary Angiography;  Surgeon: Peter Eric Martinique, MD;  Location: [facility] INVASIVE CV LAB CUPID;  Service: Cardiovascular;  Laterality: N/A;  . Cardiolite myocardial perfusion study  04/16/03    NEGITIVE BRUCE PROTOCAL EXERCISE STRESS TEST. EF 70%. NO ISCHEMIA.    SOCIAL HISTORY: History   Social History  . Marital Status: Married    Spouse Name: N/A  . Number of Children: 1   . Years of Education: N/A   Occupational History  . A retired Therapist, occupational    Social History Main Topics  . Smoking status: Former Smoker    Types: Cigars    Quit date: 12/29/2003  . Smokeless tobacco: Former Systems developer    Types: Chew    Quit date: 12/28/1994  . Alcohol Use: Yes     Comment: occasional beer  . Drug Use: No  . Sexual Activity: Not on file   Other Topics Concern  . Not on file   Social History Narrative  . No narrative on file    FAMILY HISTORY: Family History  Problem Relation Age of Onset  . Cancer Mother 38    cervical cancer   . Cancer Father     unknown cancer   . Hypertension Sister   . Cancer Sister     melanoma     ALLERGIES:  is allergic to hydrochlorothiazide; asa; calcium carbonate; oxycodone; amoxicillin; and indapamide.  MEDICATIONS:  Current Outpatient Prescriptions  Medication Sig Dispense Refill  . acyclovir (ZOVIRAX) 400 MG tablet Take 1 tablet (400 mg total) by mouth 2 (two) times daily. 180 tablet 1  . aspirin 81 MG chewable tablet Chew 1 tablet (81 mg total) by mouth daily.    . calcium carbonate (OS-CAL) 600 MG TABS tablet Take 1,200 mg by mouth daily with breakfast.    . clopidogrel (PLAVIX) 75 MG tablet Take 1 tablet (75 mg total) by mouth daily. 90 tablet 3  . diphenhydrAMINE (BENADRYL) 25 mg capsule Take 25 mg by mouth at bedtime as needed.    . ezetimibe-simvastatin (VYTORIN) 10-40 MG per tablet  Take 1 tablet by mouth at bedtime.    [name] guaiFENesin (MUCINEX) 600 MG 12 hr tablet Take by mouth every evening.    [name] lenalidomide (REVLIMID) 5 MG capsule Take 1 capsule (5 mg total) by mouth daily. Take Revlimid  5 mg by mouth daily  For  21 days ,  Rest  7 days. 21 capsule 0  . lidocaine (LIDODERM) 5 % Place 1 patch onto the skin daily. Remove & Discard patch within 12 hours or as directed by MD 30 patch 1  . loperamide (IMODIUM) 2 MG capsule Take 2 mg by mouth 4 (four) times daily as needed. Reported on 04/21/2016    . midodrine (PROAMATINE) 10 MG tablet Take 1 tablet (10 mg total) by mouth 3 (three) times daily. 270 tablet 3  . Multiple Vitamins-Minerals (CENTRUM SILVER ULTRA MENS PO) Take 1 tablet by mouth daily.    . nitroGLYCERIN (NITROSTAT) 0.4  MG SL tablet Place 0.4 mg under the tongue every 5 (five) minutes x 3 doses as needed. Reported on 04/21/2016    . ondansetron (ZOFRAN) 4 MG tablet Take 1 tablet (4 mg total) by mouth 4 (four) times daily as needed for nausea or vomiting. (Patient not taking: Reported on 04/21/2016) 30 tablet 2  . polyethylene glycol (MIRALAX / GLYCOLAX) packet Take 17 g by mouth daily as needed.     . potassium chloride (MICRO-K) 10 MEQ CR capsule Take 30 mEq by mouth daily.    . ranitidine (ZANTAC) 150 MG tablet Take 2 tablets (300 mg total) by mouth at bedtime. 60 tablet 1  . spironolactone (ALDACTONE) 25 MG tablet TAKE ONE-HALF (1/2) TABLET DAILY 45 tablet 2  . traMADol (ULTRAM) 50 MG tablet Take 2 tablets (100 mg total) by mouth every 6 (six) hours as needed. 60 tablet 1  . Zoledronic Acid (ZOMETA) 4 MG/100ML IVPB Inject 4 mg into the vein.     No current facility-administered medications for this visit.    REVIEW OF SYSTEMS:   Constitutional: Denies fevers, chills or abnormal night sweats Eyes: Denies blurriness of vision, double vision or watery eyes Ears, nose, mouth, throat, and face: Denies mucositis or sore throat Respiratory: Denies cough, dyspnea or  wheezes Cardiovascular: Denies palpitation, chest discomfort or lower extremity swelling Gastrointestinal:  Denies nausea, heartburn or change in bowel habits Skin: Denies abnormal skin rashes Lymphatics: Denies new lymphadenopathy or easy bruising Neurological:Denies numbness, tingling or new weaknesses Behavioral/Psych: Mood is stable, no new changes  Musculoskeletal: Positive for back pain and intermittent rib pain All other systems were reviewed with the patient and are negative.  PHYSICAL EXAMINATION: ECOG PERFORMANCE STATUS: 1 BP 115/55 mmHg  Pulse 55  Temp(Src) 98 F (36.7 C) (Oral)  Resp 17  Wt 150 lb 1.6 oz (68.085 kg)  SpO2 100%  GENERAL: He comes in today with a wheelchair, alert and oriented, no distress, chronically ill-appearing SKIN: skin color, texture, turgor are normal, no rashes or significant lesions except to some healing rash on his hands EYES: normal, conjunctiva are pink and non-injected, sclera clear OROPHARYNX:no exudate, no erythema and lips, buccal mucosa, and tongue normal  NECK: supple, thyroid normal size, non-tender, without nodularity LYMPH:  no palpable lymphadenopathy in the cervical, axillary or inguinal LUNGS: clear to auscultation and percussion with normal breathing effort HEART: regular rate & rhythm and no murmurs and no lower extremity edema ABDOMEN:abdomen soft, non-tender and normal bowel sounds Musculoskeletal:no cyanosis of digits and no clubbing, mild tenderness on bilateral lateral chest wall.  PSYCH: alert & oriented x 3 with fluent speech NEURO: no focal motor/sensory deficits CHEST WALL: slightly enlarged left breast, mild tenderness, no palpable mass. He states his left breast is slightly bigger than right for all his adult life.  LABORATORY DATA:   CBC Latest Ref Rng 05/19/2016 04/21/2016 03/24/2016  WBC 4.0 - 10.3 10e3/uL 4.2 4.4 4.8  Hemoglobin 13.0 - 17.1 g/dL 15.8 16.5 15.9  Hematocrit 38.4 - 49.9 % 47.2 50.6(H) 47.6    Platelets 140 - 400 10e3/uL 164 181 193   CMP Latest Ref Rng 05/19/2016 05/19/2016 04/21/2016  Glucose 70 - 140 mg/dl 98 - 75  BUN 7.0 - 26.0 mg/dL 6.7(L) - 5.1(L)  Creatinine 0.7 - 1.3 mg/dL 1.1 - 1.1  Sodium 136 - 145 mEq/L 138 - 140  Potassium 3.5 - 5.1 mEq/L 3.4(L) - 3.3(L)  CO2 22 - 29 mEq/L 18(L) - 24  Calcium 8.4 - 10.4 mg/dL  8.9 - 9.4  Total Protein 6.0 - 8.5 g/dL 6.8 6.1 6.8  Total Bilirubin 0.20 - 1.20 mg/dL 0.58 - 0.49  Alkaline Phos 40 - 150 U/L 106 - 129  AST 5 - 34 U/L 50(H) - 82(H)  ALT 0 - 55 U/L 74(H) - 115(H)   MM lab: SPEP Eric-protein (g/dl) 03/15/2015: 1.6 05/24/2015: 0.5 06/21/2015: 0.4 07/18/2015 (at [facility]): 0.33 11/27/2015: not det 01/21/2016: not det  03/24/2016: not det 05/19/2016: not det   Serum IgG (mg/dl) 03/15/2015: 2000 05/24/2015: 579 06/21/2015: 673 07/18/2015 ([facility]): 509 11/27/2015: 396 01/21/2016: 487 03/24/2016: 563 05/19/2016: 600  Serum kappa/lamda/ratio (mg/dl)  03/15/2015: 690, 0.13, 5308 05/24/2015: 146, 1.27, 115.0 06/21/2015: 86, 0.74, 116.2 01/21/2016: 1.09, 1.21, 0.9 03/24/2016: 1.72, 1.49, 1.16 05/19/2016: 1.91, 1.50, 1.27  24 h urine kappa light chain (mg) 03/18/2015: 2434 05/28/2015: 582 06/24/2015: 322 04/27/2016: 499    PATHOLOGY REPORT Bone Marrow, Aspirate,Biopsy, and Clot, right iliac 03/27/2015 - HYPERCELLULAR BONE MARROW FOR AGE WITH PLASMA CELL NEOPLASM. - SEE COMMENT. PERIPHERAL BLOOD: - NO SIGNIFICANT MORPHOLOGIC ABNORMALITIES. Diagnosis Note The bone marrow shows increased number of atypical plasma cells representing 47% of all cells in the aspirate associated with prominent interstitial infiltrates and variably sized aggregates in the clot and biopsy sections. Immunohistochemical stains show that the plasma cells are kappa light chain restricted consistent with plasma cell neoplasm. The background shows trilineage  Cytogenetics: normal FISH panel: +4 and +11  Bone Marrow, Aspirate,Biopsy, and Clot, 11/27/2015 at  [place]: [id number] RECEIVED: 11/27/2015 ORDERING PHYSICIAN: CESAR Melburn Hake , MD PATIENT NAME: Gerrit Heck BONE MARROW REPORT   Bone Marrow (BM) and Peripheral Blood (PB) FINAL PATHOLOGIC DIAGNOSIS  BONE MARROW: Hypocellular marrow (10%) with no increase in plasma cells. See comment.  PERIPHERAL BLOOD: Absolute lymphopenia. See CBC data.  COMMENT: The touch prep is cellular and adequate for evaluation. Rare plasma cells are present (<1%). Blasts are not increased, Myeloid and erythroid cells are present in normal proportions with intact maturation and no overt dysplastic changes. Megakaryocytes are absent. The aspirate smears are aspicular and hemodilute.  Bone marrow and clot sections are variably hypocellular (0-20% range, 10% overall). Scattered interstitial plasma cells are present and comprise <5% of marrow cellularity by CD138 immunohistochemical analysis. Blasts are not increased. Myeloid and erythroid cells are present in normal proportions and have no overt morphologic abnormalities. Megakaryocytes are morphologically within normal limits.  Examination of the peripheral blood reveals absolute lymphopenia. There are no circulating plasma cells or evidence of rouleaux formation.   RADIOGRAPHIC STUDIES: CT anginal chest 09/30/2015 IMPRESSION: 1. No pulmonary embolism. 2. No thoracic aortic aneurysm or dissection. 3. Heart size is normal. No pericardial effusion. 4. Mild scarring/atelectasis at each lung base, probably chronic. Lungs otherwise clear. No evidence of pneumonia. No pleural effusion. 5. Evidence of diffuse multiple myeloma involvement throughout the thoracolumbar spine and multiple ribs bilaterally, with areas of present clinical relevance described below. 6. Compression fracture deformity of the T12 vertebral body, known pathologic fracture, status post treatment with vertebroplasty on 04/19/2015. 7. Milder  compression deformity of the overlying T11 vertebral body, approximately 40% compressed anteriorly, also presumably pathologic. This vertebral body appeared essentially normal on fluoroscopic images from the earlier aforementioned vertebroplasty of 04/19/2015. 8. Slight irregularity of the right lateral sixth rib, with subtle cortical sclerosis suggesting nondisplaced healing fracture. This may be a relatively acute fracture and is probably pathologic in nature related to underlying multiple myeloma. 9. Similar irregularity of the left lateral fifth rib, suggesting minimally  displaced healing fracture. This may also be a relatively acute fracture and is likely pathologic in nature related to underlying multiple myeloma. These acute or subacute rib fractures are the most likely source of patient's bilateral chest pain. Additional incidental findings in the upper abdomen: Fatty infiltration of liver, cholelithiasis without evidence of acute cholecystitis, fairly large amount of stool within the nondistended colon of the upper abdomen (constipation? ).   ASSESSMENT & PLAN:  65 year old Caucasian male with past medical history of hypertension, tonsil cancer status post surgery and radiation 10 years ago, now presented with 3-4 months low back pain and intermittent rib pain. His blood test showed Eric protein 1.7g/dl.  1. IgG multiple myeloma, stage I, standard risk by cytogenetics (+4 and +11), in remission  -I previously discussed his initial bone marrow biopsy results with him. He has significant amount of plasma cells in the bone marrow 47%.  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 Based on the new notable myeloma diagnostic criteria, he  meets the criteria of multiple myeloma. He has normal albumin and miildly elevated beta 2 microglobulin, this is stage I. -His cytogenetics and Fish panel revealed standard risk. -His thoracic and lumbar spine MRI showed pathologic compression fracture at T12, focal myeloma deposits at L3 and T9, diffuse marrow signal abnormality consistent with myeloma -I recommended induction chemotherapy with VRD regimen, weekly Velcade and dexamethasone, first 2 cycles Revlimid dose reduced to 10 mg daily 3 week on, one-week off, based on his renal function, and it which was increased to full dose from cycle 3. He received a total of 4 cycles of treatment, achieved a very good partial response. Repeat bone marrow on 07/18/2015 showed 5% plasma cells, fish and cytogenetics was normal. -His post transplant evaluation showed complete remission (<1% plasma cell in marrow, undetectable Eric protein), and he has recovered very well. -He has started maintenance Revlimid 5 mg daily, 3 weeks on, 1 week off, tolerating well, we'll continue. -lab results reviewed with him, stable mild transaminitis, his Eric protein remains to be negative, we'll continue Revlimid, he will start next cycle in 1 week. -Continue monitoring CBC and CMP every 4 weeks, multiple myeloma labs every 2 months  2.  Transaminitis - he has had elevated ALT and AST, normal bilirubin possible related to medications -overall mild and stable , we'll continue monitoring  3. T12 pathologic compression fracture, and diffuse myeloma involvement in bones -Status post kyphoplasty and palliative radiation  -He received palliative Radiation, pain much improved overall. He does complain more mid back and right-sided chest pain daily, likely related to his physical activities. -continue zometa infusion monthly, and changed to every 3 months after one year of therapy  4. nonobstructive CAD, STEMI, Non ischemic CM, Takatsubo, with EF of 35-40% -Patient was initially found  to have troponin elevation and anterior apical ST elevation MI and resultant cardiomyopathy on 04/26/15 -He underwent a cardiac catheterization on 5/3 which showed mild diffuse disease, no stents were placed. He is on aspirin -follow up with Dr. Debara Pickett  -Repeat echo before transplant showed EF 55-60%  5. Peripheral neuropathy, grade 1 -Likely secondary to his bone marrow transplant chemotherapy -Mild and Stable overall, we'll continue observation  6.  Hypokalemia - potassium 3.4 today, he will continue potassium  3 tab daily. -I encouraged him to eat potassium enriched food -we'll continue monitoring  7. Advanced directives -his advanced directives is in EPIC -he is full code.   Plan -lab results reviewed with patient, he will start next cycle of Revlimid on 05/27/2016, refill was called in today  -Zometa infusion today and monthly  -I'll see him back in 4 weeks   All questions were answered. The patient knows to call the clinic with any problems, questions or concerns.  I spent 20 minutes counseling the patient face to face. The total time spent in the appointment was 30 minutes and more than 50% was on counseling.     Eric Merle, MD 05/19/2016

## 2016-05-19 NOTE — Telephone Encounter (Signed)
per pof to sch pt appt-sent MW email to sch trmt-pt og et updated copy b4 leaving**

## 2016-05-19 NOTE — Telephone Encounter (Signed)
Per staff phone call and POF I have schedueld appts. Scheduler advised of appts.  JMW  

## 2016-05-19 NOTE — Patient Instructions (Signed)

## 2016-05-20 LAB — KAPPA/LAMBDA LIGHT CHAINS
IG KAPPA FREE LIGHT CHAIN: 19.14 mg/L (ref 3.30–19.40)
IG LAMBDA FREE LIGHT CHAIN: 15.03 mg/L (ref 5.71–26.30)
KAPPA/LAMBDA FLC RATIO: 1.27 (ref 0.26–1.65)

## 2016-05-21 LAB — MULTIPLE MYELOMA PANEL, SERUM
ALBUMIN/GLOB SERPL: 1.5 (ref 0.7–1.7)
ALPHA 1: 0.2 g/dL (ref 0.0–0.4)
ALPHA2 GLOB SERPL ELPH-MCNC: 0.9 g/dL (ref 0.4–1.0)
Albumin SerPl Elph-Mcnc: 3.6 g/dL (ref 2.9–4.4)
B-GLOBULIN SERPL ELPH-MCNC: 0.9 g/dL (ref 0.7–1.3)
Gamma Glob SerPl Elph-Mcnc: 0.5 g/dL (ref 0.4–1.8)
Globulin, Total: 2.5 g/dL (ref 2.2–3.9)
IGG (IMMUNOGLOBIN G), SERUM: 600 mg/dL — AB (ref 700–1600)
IGM (IMMUNOGLOBIN M), SRM: 12 mg/dL — AB (ref 20–172)
IgA, Qn, Serum: 12 mg/dL — ABNORMAL LOW (ref 61–437)
TOTAL PROTEIN: 6.1 g/dL (ref 6.0–8.5)

## 2016-05-27 ENCOUNTER — Ambulatory Visit (INDEPENDENT_AMBULATORY_CARE_PROVIDER_SITE_OTHER): Payer: Medicare Other | Admitting: Internal Medicine

## 2016-05-27 ENCOUNTER — Encounter: Payer: Self-pay | Admitting: Internal Medicine

## 2016-05-27 VITALS — BP 122/64 | HR 59 | Ht 68.0 in | Wt 150.0 lb

## 2016-05-27 DIAGNOSIS — E785 Hyperlipidemia, unspecified: Secondary | ICD-10-CM | POA: Insufficient documentation

## 2016-05-27 DIAGNOSIS — I214 Non-ST elevation (NSTEMI) myocardial infarction: Secondary | ICD-10-CM | POA: Diagnosis not present

## 2016-05-27 DIAGNOSIS — I429 Cardiomyopathy, unspecified: Secondary | ICD-10-CM | POA: Diagnosis not present

## 2016-05-27 DIAGNOSIS — C9001 Multiple myeloma in remission: Secondary | ICD-10-CM | POA: Diagnosis not present

## 2016-05-27 DIAGNOSIS — I428 Other cardiomyopathies: Secondary | ICD-10-CM

## 2016-05-27 NOTE — Patient Instructions (Signed)
Your physician wants you to follow-up in: 6 months with Dr. Hilty. You will receive a reminder letter in the mail two months in advance. If you don't receive a letter, please call our office to schedule the follow-up appointment.    

## 2016-05-27 NOTE — Progress Notes (Signed)
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 He has been on Vytorin 10/40 mg. Recently, a lipid NMR demonstrated a high particle number at 1323 with an LDL cholesterol of 67, indicating increased risk. He also underwent a [place] heart protocol per your request, which showed an elevated lipoprotein(a) at 155 and elevated insulin level, suggesting early insulin resistance. Hemoglobin was 5.8. APOB levels were mildly elevated as well as APOA1 fractions. However, overall cholesterol control is fairly good. In the past, I had recommended changing him to Crestor; however, apparently he was intolerant to this in the past as well as Lipitor and another statin, which he does not recall. He is on low-dose Diovan for hypertension and had been on Norvasc in the past. The Diovan was apparently started by Dr. Einar Gip prior to my caring for him and I feel that this is a reasonable medicine for blood pressure control. Symptomatically, he denies any chest pain, shortness of breath, palpitations, presyncope, or syncopal symptoms.   I the pleasure see Mr. Link back in the office today. He was recently hospitalized after presenting with a syncopal event. He was admitted on 4/19 for acute back pain where he was found to have a pathological T12 fracture IR consulted and recommended kyphoplasty which was performed on 04/19/15.Patient came to his regular scheduled injection of Velcade at the cancer center 4/29 and was being mapped for XRT and while being transferred from chair to table he was dropped down below usual 30. Subsequent to this he had an episode wherein he had severe pain and became unresponsive for about 20 seconds. EKG demonstrated  anteroapical ST elevation and he had car catheterization on 04/30/2015. This demonstratedProx RCA lesion, 30% stenosed. Ost LM to LM lesion, 30% stenosed, normal LVEDP. Echocardiography, however demonstrated reduced LV function with an EF of 35-40% and a large anteroapical aneurysm. The etiology of this aneurysm is not quite clear but thought to be related to prior radiation therapy and or chemotherapy for tonsillar cancer. He did suffer an NSTEMI, with troponin elevation to greater than 4. He was placed on aspirin and appropriately Plavix. There was some question as to whether he needed to continue this, however based on the CURE trial data, there is indication for therapy at least one year even in patients who did not have prior stenting. There is also new are data using Brilinta with similar if not better outcome data. Currently he reports some fatigue and had an episode yesterday where he vomited number of times and is somewhat weak today. Blood pressure is low normal.  [name] returns today for follow-up. He has been diagnosed with multiple myeloma and recently underwent bone marrow transplant. During that time he had congestive heart failure exacerbation. Prior to transplant his EF has improved and in fact a repeat echocardiogram performed on 9 7 2016 demonstrated an EF of 65-70%.  He's not currently on heart failure medication other than low-dose metoprolol XL 12.5 mg daily due to hypotension.  He is also on low-dose aspirin 81 mg and Plavix 75 mg.   As a reminder, there is no significant obstructive disease by cath in April and the dual antiplatelets therapy is for non-STEMI..  [name] was seen back in the office today  for follow-up. He continues to have symptoms of lower orthostatic hypotension. Orthostatic vitals today indicated a drop of blood pressure by 30 mmHg. He's also noted to be tachycardic. He was dizzy with change in position. As last office visit I stopped his Aldactone which he says  has not made a significant difference in his symptoms. He is on low-dose beta blocker but remains tachycardic. I suspect this may be related to either multiple myeloma or his recent stem cell transplant. He recently had repeat echocardiogram as described above which showed improvement in LV function.  [name] reports a marked improvement in his orthostatic symptoms on midodrine. We have discontinued all of blood pressure medicines however for some reason he received valsartan in the mail. Fortunately he did not take this medicine. In generally very pleased with the midodrine and its improvement in his blood pressures. He is now able to do most activities and seems to be improving. The etiology of his low blood pressures is not quite clear. I would like to check a repeat limited echo to look for changes in LV function.  05/27/2016  [name] returns today for follow-up. He is doing exceedingly well after his bone marrow transplant. He is no longer struggling with hypotension and is come off of midodrine. Blood pressure is normal today with a heart rate in the upper 50s. He is not back on any blood pressure medications. As previously mentioned his LVEF has normalized. He reports some mild shortness of breath but that continues to improve day by day. His strength is also getting better.  PMHx:  Past Medical History  Diagnosis Date  . Hypertension   . Dyslipidemia   . Tonsillar cancer ([place]) 2006  . Cholelithiases   . Hyperlipidemia   . Chronic kidney disease     Eric Dudley    Past Surgical History  Procedure Laterality Date  . Appendectomy  1962  . Shoulder surgery  1999    left  . Knee surgery  2012    right  . Cardiac catheterization N/A 04/30/2015    Procedure: Left Heart Cath and Coronary Angiography;  Surgeon: Peter M Martinique, MD;  Location: [facility] INVASIVE CV LAB CUPID;  Service: Cardiovascular;  Laterality: N/A;  . Cardiolite myocardial perfusion study  04/16/03    NEGITIVE BRUCE  PROTOCAL EXERCISE STRESS TEST. EF 70%. NO ISCHEMIA.    FAMHx:  Family History  Problem Relation Age of Onset  . Cancer Mother 7    cervical cancer   . Cancer Father     unknown cancer   . Hypertension Sister   . Cancer Sister     melanoma     SOCHx:   reports that he quit smoking about 12 years ago. His smoking use included Cigars. He quit smokeless tobacco use about 21 years ago. His smokeless tobacco use included Chew. He reports that he drinks alcohol. He reports that he does not use illicit drugs.  ALLERGIES:  Allergies  Allergen Reactions  . Hydrochlorothiazide Rash and Palpitations  . Asa [Aspirin] Nausea Only  . Calcium Carbonate Nausea And Vomiting  . Oxycodone Other (See Comments)    Patient does not want to take. Severe Hallucinations.  . Amoxicillin Itching and Rash  . Indapamide Palpitations    Lightheadedness, dizziness    ROS: Pertinent items noted in HPI and remainder of comprehensive ROS otherwise negative.  HOME MEDS: Current Outpatient Prescriptions  Medication Sig Dispense Refill  . acyclovir (ZOVIRAX) 400 MG tablet Take 1 tablet (400 mg  total) by mouth 2 (two) times daily. 180 tablet 1  . aspirin 81 MG chewable tablet Chew 1 tablet (81 mg total) by mouth daily.    . calcium carbonate (OS-CAL) 600 MG TABS tablet Take 1,200 mg by mouth daily with breakfast.    . diphenhydrAMINE (BENADRYL) 25 mg capsule Take 25 mg by mouth at bedtime as needed.    . ezetimibe-simvastatin (VYTORIN) 10-40 MG per tablet Take 1 tablet by mouth at bedtime.    [name] lenalidomide (REVLIMID) 5 MG capsule Take 1 capsule (5 mg total) by mouth daily. Take Revlimid  5 mg by mouth daily  For  21 days ,  Rest  7 days. 21 capsule 0  . lidocaine (LIDODERM) 5 % Place 1 patch onto the skin daily. Remove & Discard patch within 12 hours or as directed by MD 30 patch 1  . loperamide (IMODIUM) 2 MG capsule Take 2 mg by mouth 4 (four) times daily as needed. Reported on 04/21/2016    . Multiple  Vitamins-Minerals (CENTRUM SILVER ULTRA MENS PO) Take 1 tablet by mouth daily.    . nitroGLYCERIN (NITROSTAT) 0.4 MG SL tablet Place 0.4 mg under the tongue every 5 (five) minutes x 3 doses as needed. Reported on 05/19/2016    . ondansetron (ZOFRAN) 4 MG tablet Take 1 tablet (4 mg total) by mouth 4 (four) times daily as needed for nausea or vomiting. 30 tablet 2  . polyethylene glycol (MIRALAX / GLYCOLAX) packet Take 17 g by mouth daily as needed.     . potassium chloride (MICRO-K) 10 MEQ CR capsule Take 30 mEq by mouth daily.    . ranitidine (ZANTAC) 150 MG tablet Take 2 tablets (300 mg total) by mouth at bedtime. 60 tablet 1   No current facility-administered medications for this visit.    LABS/IMAGING: No results found for this or any previous visit (from the past 48 hour(s)). No results found.  VITALS: BP 122/64 mmHg  Pulse 59  Ht '5\' 8"'  (1.727 m)  Wt 150 lb (68.04 kg)  BMI 22.81 kg/m2  EXAM: General appearance: alert Neck: no carotid bruit and no JVD Lungs: clear to auscultation bilaterally Heart: regular rate and rhythm, S1, S2 normal, no murmur, click, rub or gallop Abdomen: soft, non-tender; bowel sounds normal; no masses,  no organomegaly Extremities: extremities normal, atraumatic, no cyanosis or edema Pulses: 2+ and symmetric Skin: Skin color, texture, turgor normal. No rashes or lesions Neurologic: Grossly normal  EKG: Sinus bradycardia at 59  ASSESSMENT: 1. Nonischemic cardiomyopathy, EF 35-40% - improved to 65-70% by echo at [facility] on 09/04/15 2. 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 Hypertension 4. Dyslipidemia 5. History of tonsillar cancer 6. Multiple myeloma-s/p BMT 7. CKD2 8. Orthostatic hypotension - resolved, off of midodrine  PLAN: 1.   Mr. Abascal continues to improve on a daily basis after his bone marrow transplant. His orthostatic hypotension is completely resolved. He is now off of midodrine. There is not room at this  point to restart him on any heart failure medications. Fortunately his EF has recovered. He's doing exercise 2-3 times a week and continues to get stronger. I recommend continuing that we'll plan to see him back in 6 months.  Pixie Casino, MD, [facility] Attending Cardiologist [place] 05/27/2016, 1:45 PM

## 2016-05-29 ENCOUNTER — Other Ambulatory Visit: Payer: Self-pay | Admitting: Internal Medicine

## 2016-06-03 DIAGNOSIS — R0781 Pleurodynia: Secondary | ICD-10-CM | POA: Diagnosis not present

## 2016-06-03 DIAGNOSIS — Z7982 Long term (current) use of aspirin: Secondary | ICD-10-CM | POA: Diagnosis not present

## 2016-06-03 DIAGNOSIS — Z9484 Stem cells transplant status: Secondary | ICD-10-CM | POA: Diagnosis not present

## 2016-06-03 DIAGNOSIS — C9000 Multiple myeloma not having achieved remission: Secondary | ICD-10-CM | POA: Diagnosis not present

## 2016-06-17 DIAGNOSIS — M810 Age-related osteoporosis without current pathological fracture: Secondary | ICD-10-CM | POA: Diagnosis not present

## 2016-06-18 ENCOUNTER — Other Ambulatory Visit: Payer: Self-pay | Admitting: *Deleted

## 2016-06-18 DIAGNOSIS — C9001 Multiple myeloma in remission: Secondary | ICD-10-CM

## 2016-06-18 MED ORDER — LENALIDOMIDE 5 MG PO CAPS: 5.0000 mg | ORAL_CAPSULE | Freq: Every day | ORAL | Status: DC

## 2016-06-26 ENCOUNTER — Telehealth: Payer: Self-pay | Admitting: Hematology

## 2016-06-26 ENCOUNTER — Other Ambulatory Visit (HOSPITAL_BASED_OUTPATIENT_CLINIC_OR_DEPARTMENT_OTHER): Payer: Medicare Other

## 2016-06-26 ENCOUNTER — Ambulatory Visit (HOSPITAL_BASED_OUTPATIENT_CLINIC_OR_DEPARTMENT_OTHER): Payer: Medicare Other

## 2016-06-26 ENCOUNTER — Ambulatory Visit (HOSPITAL_BASED_OUTPATIENT_CLINIC_OR_DEPARTMENT_OTHER): Payer: Medicare Other | Admitting: Hematology

## 2016-06-26 ENCOUNTER — Encounter: Payer: Self-pay | Admitting: Hematology

## 2016-06-26 VITALS — BP 111/59 | HR 51 | Temp 97.6°F | Resp 18 | Ht 68.0 in | Wt 147.6 lb

## 2016-06-26 DIAGNOSIS — I10 Essential (primary) hypertension: Secondary | ICD-10-CM

## 2016-06-26 DIAGNOSIS — G893 Neoplasm related pain (acute) (chronic): Secondary | ICD-10-CM | POA: Diagnosis not present

## 2016-06-26 DIAGNOSIS — G620 Drug-induced polyneuropathy: Secondary | ICD-10-CM

## 2016-06-26 DIAGNOSIS — IMO0001 Reserved for inherently not codable concepts without codable children: Secondary | ICD-10-CM

## 2016-06-26 DIAGNOSIS — C9001 Multiple myeloma in remission: Secondary | ICD-10-CM

## 2016-06-26 DIAGNOSIS — E876 Hypokalemia: Secondary | ICD-10-CM | POA: Diagnosis not present

## 2016-06-26 DIAGNOSIS — M4850XD Collapsed vertebra, not elsewhere classified, site unspecified, subsequent encounter for fracture with routine healing: Secondary | ICD-10-CM

## 2016-06-26 DIAGNOSIS — I428 Other cardiomyopathies: Secondary | ICD-10-CM

## 2016-06-26 DIAGNOSIS — C9000 Multiple myeloma not having achieved remission: Secondary | ICD-10-CM

## 2016-06-26 LAB — COMPREHENSIVE METABOLIC PANEL
ALBUMIN: 3.5 g/dL (ref 3.5–5.0)
ALT: 40 U/L (ref 0–55)
ANION GAP: 7 meq/L (ref 3–11)
AST: 24 U/L (ref 5–34)
Alkaline Phosphatase: 126 U/L (ref 40–150)
BUN: 5.4 mg/dL — ABNORMAL LOW (ref 7.0–26.0)
CALCIUM: 9.1 mg/dL (ref 8.4–10.4)
CHLORIDE: 112 meq/L — AB (ref 98–109)
CO2: 22 meq/L (ref 22–29)
Creatinine: 1.2 mg/dL (ref 0.7–1.3)
EGFR: 66 mL/min/{1.73_m2} — ABNORMAL LOW (ref >90)
GLUCOSE: 63 mg/dL — AB (ref 70–140)
Potassium: 3.4 mEq/L — ABNORMAL LOW (ref 3.5–5.1)
Sodium: 141 mEq/L (ref 136–145)
Total Bilirubin: 0.43 mg/dL (ref 0.20–1.20)
Total Protein: 6.3 g/dL — ABNORMAL LOW (ref 6.4–8.3)

## 2016-06-26 LAB — CBC WITH DIFFERENTIAL/PLATELET
BASO%: 0.8 % (ref 0.0–2.0)
BASOS ABS: 0 10*3/uL (ref 0.0–0.1)
EOS%: 1.7 % (ref 0.0–7.0)
Eosinophils Absolute: 0.1 10*3/uL (ref 0.0–0.5)
HEMATOCRIT: 45.8 % (ref 38.4–49.9)
HEMOGLOBIN: 15 g/dL (ref 13.0–17.1)
LYMPH#: 0.6 10*3/uL — AB (ref 0.9–3.3)
LYMPH%: 18.6 % (ref 14.0–49.0)
MCH: 35.1 pg — ABNORMAL HIGH (ref 27.2–33.4)
MCHC: 32.9 g/dL (ref 32.0–36.0)
MCV: 106.8 fL — ABNORMAL HIGH (ref 79.3–98.0)
MONO#: 0.8 10*3/uL (ref 0.1–0.9)
MONO%: 22.3 % — ABNORMAL HIGH (ref 0.0–14.0)
NEUT#: 1.9 10*3/uL (ref 1.5–6.5)
NEUT%: 56.6 % (ref 39.0–75.0)
PLATELETS: 180 10*3/uL (ref 140–400)
RBC: 4.29 10*6/uL (ref 4.20–5.82)
RDW: 15.2 % — AB (ref 11.0–14.6)
WBC: 3.4 10*3/uL — ABNORMAL LOW (ref 4.0–10.3)

## 2016-06-26 MED ORDER — ZOLEDRONIC ACID 4 MG/100ML IV SOLN
4.0000 mg | Freq: Once | INTRAVENOUS | Status: AC
  Administered 2016-06-26: 4 mg via INTRAVENOUS
  Filled 2016-06-26: qty 100

## 2016-06-26 NOTE — Progress Notes (Signed)
[place]  [phone] Fax:(336) [phone]  Clinic Follow Up Note   Patient Care Team: [name], MD as PCP - General (Internal Medicine) Pixie Casino, MD as Consulting Physician (Cardiology) Truitt Merle, MD as Consulting Physician (Hematology) 06/26/2016   CHIEF COMPLAINTS: Follow-up multiple myeloma    Multiple myeloma in remission ([place])   01/31/2015 Tumor Marker SPEP showed M-SPIKE 1.7g/dl, Cr 1.5, no anemia, hypercalcemia   03/19/2015 Imaging Bone survey showed no discrete lytic lesion, but there is osteopenia and calvarial heterogeneity.   03/27/2015 Initial Diagnosis Multiple myeloma, stage I   03/27/2015 Bone Marrow Biopsy Bone Marrow, Aspirate,Biopsy: - HYPERCELLULAR BONE MARROW FOR AGE WITH PLASMA CELL NEOPLASM 47% - SEE COMMENT. PERIPHERAL BLOOD: - NO SIGNIFICANT MORPHOLOGIC ABNORMALITIES. Diagnosis Note The bone marrow shows increased number of    03/27/2015 Miscellaneous bone marrow Cytogenetics: normal. FISH panel showed the presence of +4 and +11, this is consider standard risk of MM    04/16/2015 - 04/23/2015 Hospital Admission He was admitted for worsening back pain, was found to have a T12 pathological fracture. He underwent kyphoplasty on 04/19/2015.   04/19/2015 - 08/02/2015 Chemotherapy RVD with weekly Velcade 1.3 mg/m, dexamethasone 40 mg, and Revlimid 10 mg daily on day 1-21, every 28 days, cycle 1 Revlimid was interupted by hospitalization, cycle 3 Revlimid dose increased to 25 mg daily due to his improved renal function. total 4 cyc   04/26/2015 - 05/01/2015 Hospital Admission He was admitted to [facility] for syncope episode during his radiation simulation. EKG showed ST elevation, troponin was positive, he underwent cardio catheterization which showed mild stenosis. No intervention was needed. echo showed EF 35-40%   04/29/2015 - 05/10/2015 Radiation Therapy palliative RT to T12, 20Gy in 10 fractions   08/19/2015 Bone Marrow Transplant He  underwent autologous stem cell transplant at [facility].    11/27/2015 Bone Marrow Biopsy hypocellular marrow (10%), no increased plasma cells    12/29/2015 -  Chemotherapy maintenance Revlimid 46m daily, 3 weeks on, one week off      HISTORY OF PRESENTING ILLNESS (03/15/2015):  Eric [id number].[id number]. 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  He has been having low back pain for 3-4 months. He had food posinin 4 month ago, and had frequent diarrhea. He started noticed low back pain since then. The pain is not radiating to leg, he also has intermittent right rib pain, worse with cough and sneezing. He remains to be physically active, able to do all the activities, such as gardening work housework without much limitation, but he doesn't sings slowly because of back pain. He takes Tylenol as needed, does not like the necrotic pain medication. No night sweats, no fever or chillss, no weight loss.   He was seen by his primary care physician Dr. RMerrilee Seashore MD and had lab test (see below). He also had bone density scan 2/25 which showed osteoporosis, has not been treated yet.  Current therapy: Maintenance Revlimid 5 mg daily, 3 weeks on, one-week off, started on 12/29/2015  INTERIM HISTORY: Mr. TCounsellreturns for follow-up. He Is accompanied by his wife to the clinic today. He is doing well overall. He still has intermittent left chest and back pain, which only lasts a few minutes, overall stable, no new pain. He has good appetite and energy level, very physically active, he participates exercise program  and the [employer], also does not of yard work. He has some bruises on his right forearm, no other signs of bleeding. No fever or chills.  MEDICAL HISTORY:  Past Medical History  Diagnosis Date  . Hypertension   . Dyslipidemia   . 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  Cholelithiases   . Hyperlipidemia   . Chronic kidney disease     MILD,CHRONIC    SURGICAL HISTORY: Past Surgical History  Procedure Laterality Date  . Appendectomy  1962  . Shoulder surgery  1999    left  . Knee surgery  2012    right  . Cardiac catheterization N/A 04/30/2015    Procedure: Left Heart Cath and Coronary Angiography;  Surgeon: Peter M Martinique, MD;  Location: [facility] INVASIVE CV LAB CUPID;  Service: Cardiovascular;  Laterality: N/A;  . Cardiolite myocardial perfusion study  04/16/03    NEGITIVE BRUCE PROTOCAL EXERCISE STRESS TEST. EF 70%. NO ISCHEMIA.    SOCIAL HISTORY: History   Social History  . Marital Status: Married    Spouse Name: N/A  . Number of Children: 1   . Years of Education: N/A   Occupational History  . A retired Therapist, occupational    Social History Main Topics  . Smoking status: Former Smoker    Types: Cigars    Quit date: 12/29/2003  . Smokeless tobacco: Former Systems developer    Types: Chew    Quit date: 12/28/1994  . Alcohol Use: Yes     Comment: occasional beer  . Drug Use: No  . Sexual Activity: Not on file   Other Topics Concern  . Not on file   Social History Narrative  . No narrative on file    FAMILY HISTORY: Family History  Problem Relation Age of Onset  . Cancer Mother 60    cervical cancer   . Cancer Father     unknown cancer   . Hypertension Sister   . Cancer Sister     melanoma     ALLERGIES:  is allergic to hydrochlorothiazide; asa; calcium carbonate; oxycodone; amoxicillin; and indapamide.  MEDICATIONS:  Current Outpatient Prescriptions  Medication Sig Dispense Refill  . acyclovir (ZOVIRAX) 400 MG tablet Take 1 tablet (400 mg total) by mouth 2 (two) times daily. 180 tablet 1  . aspirin 81 MG chewable tablet Chew 1 tablet (81 mg total) by mouth daily.    . calcium carbonate (OS-CAL) 600 MG TABS tablet Take 1,200 mg by mouth daily with breakfast.    . diphenhydrAMINE (BENADRYL) 25 mg capsule Take 25 mg by mouth at bedtime as  needed.    . ezetimibe-simvastatin (VYTORIN) 10-40 MG per tablet Take 1 tablet by mouth at bedtime.    [name] lenalidomide (REVLIMID) 5 MG capsule Take 1 capsule (5 mg total) by mouth daily. Take Revlimid  5 mg by mouth daily  For  21 days ,  Rest  7 days. 21 capsule 0  . lidocaine (LIDODERM) 5 % Place 1 patch onto the skin daily. Remove & Discard patch within 12 hours or as directed by MD 30 patch 1  . loperamide (IMODIUM) 2 MG capsule Take 2 mg by mouth 4 (four) times daily as needed. Reported on 04/21/2016    . Multiple Vitamins-Minerals (CENTRUM SILVER ULTRA MENS PO) Take 1 tablet by mouth daily.    . nitroGLYCERIN (NITROSTAT) 0.4 MG SL tablet Place 0.4 mg under the tongue every 5 (five) minutes x 3 doses as needed. Reported on 05/19/2016    .  ondansetron (ZOFRAN) 4 MG tablet Take 1 tablet (4 mg total) by mouth 4 (four) times daily as needed for nausea or vomiting. 30 tablet 2  . polyethylene glycol (MIRALAX / GLYCOLAX) packet Take 17 g by mouth daily as needed.     . potassium chloride (MICRO-K) 10 MEQ CR capsule Take 30 mEq by mouth daily.    . ranitidine (ZANTAC) 150 MG tablet Take 2 tablets (300 mg total) by mouth at bedtime. 60 tablet 1   No current facility-administered medications for this visit.    REVIEW OF SYSTEMS:   Constitutional: Denies fevers, chills or abnormal night sweats Eyes: Denies blurriness of vision, double vision or watery eyes Ears, nose, mouth, throat, and face: Denies mucositis or sore throat Respiratory: Denies cough, dyspnea or wheezes Cardiovascular: Denies palpitation, chest discomfort or lower extremity swelling Gastrointestinal:  Denies nausea, heartburn or change in bowel habits Skin: Denies abnormal skin rashes Lymphatics: Denies new lymphadenopathy or easy bruising Neurological:Denies numbness, tingling or new weaknesses Behavioral/Psych: Mood is stable, no new changes  Musculoskeletal: Positive for back pain and intermittent rib pain All other systems were  reviewed with the patient and are negative.  PHYSICAL EXAMINATION: ECOG PERFORMANCE STATUS: 1 BP 111/59 mmHg  Pulse 51  Temp(Src) 97.6 F (36.4 C) (Oral)  Resp 18  Ht _0  (1.727 m)  Wt 147 lb 9.6 oz (66.951 kg)  BMI 22.45 kg/m2  SpO2 100%  GENERAL: He comes in today with a wheelchair, alert and oriented, no distress, chronically ill-appearing SKIN: skin color, texture, turgor are normal, no rashes or significant lesions except to some healing rash on his hands EYES: normal, conjunctiva are pink and non-injected, sclera clear OROPHARYNX:no exudate, no erythema and lips, buccal mucosa, and tongue normal  NECK: supple, thyroid normal size, non-tender, without nodularity LYMPH:  no palpable lymphadenopathy in the cervical, axillary or inguinal LUNGS: clear to auscultation and percussion with normal breathing effort HEART: regular rate & rhythm and no murmurs and no lower extremity edema ABDOMEN:abdomen soft, non-tender and normal bowel sounds Musculoskeletal:no cyanosis of digits and no clubbing, mild tenderness on bilateral lateral chest wall.  PSYCH: alert & oriented x 3 with fluent speech NEURO: no focal motor/sensory deficits CHEST WALL: slightly enlarged left breast, mild tenderness, no palpable mass. He states his left breast is slightly bigger than right for all his adult life.  LABORATORY DATA:   CBC Latest Ref Rng 06/26/2016 05/19/2016 04/21/2016  WBC 4.0 - 10.3 10e3/uL 3.4(L) 4.2 4.4  Hemoglobin 13.0 - 17.1 g/dL 15.0 15.8 16.5  Hematocrit 38.4 - 49.9 % 45.8 47.2 50.6(H)  Platelets 140 - 400 10e3/uL 180 164 181   CMP Latest Ref Rng 06/26/2016 05/19/2016 05/19/2016  Glucose 70 - 140 mg/dl 63(L) 98 -  BUN 7.0 - 26.0 mg/dL 5.4(L) 6.7(L) -  Creatinine 0.7 - 1.3 mg/dL 1.2 1.1 -  Sodium 136 - 145 mEq/L 141 138 -  Potassium 3.5 - 5.1 mEq/L 3.4(L) 3.4(L) -  CO2 22 - 29 mEq/L 22 18(L) -  Calcium 8.4 - 10.4 mg/dL 9.1 8.9 -  Total Protein 6.4 - 8.3 g/dL 6.3(L) 6.8 6.1  Total  Bilirubin 0.20 - 1.20 mg/dL 0.43 0.58 -  Alkaline Phos 40 - 150 U/L 126 106 -  AST 5 - 34 U/L 24 50(H) -  ALT 0 - 55 U/L 40 74(H) -   MM lab: SPEP M-protein (g/dl) 03/15/2015: 1.6 05/24/2015: 0.5 06/21/2015: 0.4 07/18/2015 (at [facility]): 0.33 11/27/2015: not det 01/21/2016: not det  03/24/2016: not  det 05/19/2016: not det   Serum IgG (mg/dl) 03/15/2015: 2000 05/24/2015: 579 06/21/2015: 673 07/18/2015 ([facility]): 509 11/27/2015: 396 01/21/2016: 487 03/24/2016: 563 05/19/2016: 600  Serum kappa/lamda/ratio (mg/dl)  03/15/2015: 690, 0.13, 5308 05/24/2015: 146, 1.27, 115.0 06/21/2015: 86, 0.74, 116.2 01/21/2016: 1.09, 1.21, 0.9 03/24/2016: 1.72, 1.49, 1.16 05/19/2016: 1.91, 1.50, 1.27  24 h urine kappa light chain (mg) 03/18/2015: 2434 05/28/2015: 582 06/24/2015: 322 04/27/2016: 499    PATHOLOGY REPORT Bone Marrow, Aspirate,Biopsy, and Clot, right iliac 03/27/2015 - HYPERCELLULAR BONE MARROW FOR AGE WITH PLASMA CELL NEOPLASM. - SEE COMMENT. PERIPHERAL BLOOD: - NO SIGNIFICANT MORPHOLOGIC ABNORMALITIES. Diagnosis Note The bone marrow shows increased number of atypical plasma cells representing 47% of all cells in the aspirate associated with prominent interstitial infiltrates and variably sized aggregates in the clot and biopsy sections. Immunohistochemical stains show that the plasma cells are kappa light chain restricted consistent with plasma cell neoplasm. The background shows trilineage  Cytogenetics: normal FISH panel: +4 and +11  Bone Marrow, Aspirate,Biopsy, and Clot, 11/27/2015 at [facility]: [id number] RECEIVED: 11/27/2015 ORDERING PHYSICIAN: CESAR Melburn Hake , MD PATIENT NAME: Gerrit Heck BONE MARROW REPORT   Bone Marrow (BM) and Peripheral Blood (PB) FINAL PATHOLOGIC DIAGNOSIS  BONE MARROW: Hypocellular marrow (10%) with no increase in plasma cells. See comment.  PERIPHERAL BLOOD: Absolute lymphopenia. See CBC data.  COMMENT: The touch prep  is cellular and adequate for evaluation. Rare plasma cells are present (<1%). Blasts are not increased, Myeloid and erythroid cells are present in normal proportions with intact maturation and no overt dysplastic changes. Megakaryocytes are absent. The aspirate smears are aspicular and hemodilute.  Bone marrow and clot sections are variably hypocellular (0-20% range, 10% overall). Scattered interstitial plasma cells are present and comprise <5% of marrow cellularity by CD138 immunohistochemical analysis. Blasts are not increased. Myeloid and erythroid cells are present in normal proportions and have no overt morphologic abnormalities. Megakaryocytes are morphologically within normal limits.  Examination of the peripheral blood reveals absolute lymphopenia. There are no circulating plasma cells or evidence of rouleaux formation.   RADIOGRAPHIC STUDIES: CT anginal chest 09/30/2015 IMPRESSION: 1. No pulmonary embolism. 2. No thoracic aortic aneurysm or dissection. 3. Heart size is normal. No pericardial effusion. 4. Mild scarring/atelectasis at each lung base, probably chronic. Lungs otherwise clear. No evidence of pneumonia. No pleural effusion. 5. Evidence of diffuse multiple myeloma involvement throughout the thoracolumbar spine and multiple ribs bilaterally, with areas of present clinical relevance described below. 6. Compression fracture deformity of the T12 vertebral body, known pathologic fracture, status post treatment with vertebroplasty on 04/19/2015. 7. Milder compression deformity of the overlying T11 vertebral body, approximately 40% compressed anteriorly, also presumably pathologic. This vertebral body appeared essentially normal on fluoroscopic images from the earlier aforementioned vertebroplasty of 04/19/2015. 8. Slight irregularity of the right lateral sixth rib, with subtle cortical sclerosis suggesting nondisplaced healing fracture. This may be a relatively  acute fracture and is probably pathologic in nature related to underlying multiple myeloma. 9. Similar irregularity of the left lateral fifth rib, suggesting minimally displaced healing fracture. This may also be a relatively acute fracture and is likely pathologic in nature related to underlying multiple myeloma. These acute or subacute rib fractures are the most likely source of patient's bilateral chest pain. Additional incidental findings in the upper abdomen: Fatty infiltration of liver, cholelithiasis without evidence of acute cholecystitis, fairly large amount of stool within the nondistended colon of the upper abdomen (constipation? ).   ASSESSMENT & PLAN:  65 year old  Caucasian male with past medical history of hypertension, tonsil cancer status post surgery and radiation 10 years ago, now presented with 3-4 months low back pain and intermittent rib pain. His blood test showed M protein 1.7g/dl.  1. IgG multiple myeloma, stage I, standard risk by cytogenetics (+4 and +11), in remission  -I previously discussed his initial bone marrow biopsy results with him. He has significant amount of plasma cells in the bone marrow 47%.  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 Based on the new notable myeloma diagnostic criteria, he meets the criteria of multiple myeloma. He has normal albumin and miildly elevated beta 2 microglobulin, this is stage I. -His cytogenetics and Fish panel revealed standard risk. -His thoracic and lumbar spine MRI showed pathologic compression fracture at T12, focal myeloma deposits at L3 and T9, diffuse marrow signal abnormality consistent with myeloma -I recommended induction chemotherapy with VRD regimen, weekly  Velcade and dexamethasone, first 2 cycles Revlimid dose reduced to 10 mg daily 3 week on, one-week off, based on his renal function, and it which was increased to full dose from cycle 3. He received a total of 4 cycles of treatment, achieved a very good partial response. Repeat bone marrow on 07/18/2015 showed 5% plasma cells, fish and cytogenetics was normal. -His post transplant evaluation showed complete remission (<1% plasma cell in marrow, undetectable M protein), and he has recovered very well. -He has started maintenance Revlimid 5 mg daily, 3 weeks on, 1 week off, tolerating well, we'll continue. -lab results reviewed with him, his M protein remains to be negative, we'll continue Revlimid, he will start next cycle tonight  -He is doing well, will monitoring CBC and CMP every 6 weeks, multiple myeloma labs every 3 months  2.  Transaminitis - he has had elevated ALT and AST, normal bilirubin possible related to medications -Resolved today. We'll continue monitoring  3. T12 pathologic compression fracture, and diffuse myeloma involvement in bones -Status post kyphoplasty and palliative radiation  -He received palliative Radiation, pain much improved overall. He does complain more mid back and right-sided chest pain daily, likely related to his physical activities. -continue zometa infusion monthly, and changed to every 3 months after today's indication  4. nonobstructive CAD, STEMI, Non ischemic CM, Takatsubo, with EF of 35-40% -Patient was initially found to have troponin elevation and anterior apical ST elevation MI and resultant cardiomyopathy on 04/26/15 -He underwent a cardiac catheterization on 5/3 which showed mild diffuse disease, no stents were placed. He is on aspirin -follow up with Dr. Debara Pickett  -Repeat echo before transplant showed EF 55-60%  5. Peripheral neuropathy, grade 1 -Likely secondary to his bone marrow transplant chemotherapy -Mild and Stable overall, we'll continue  observation  6.  Hypokalemia - potassium 3.4 today, he will continue potassium 3 tab daily. -I encouraged him to eat potassium enriched food -we'll continue monitoring  7. Advanced directives -his advanced directives is in EPIC -he is full code.   Plan -lab results reviewed with patient, he will start next cycle of Revlimid tonight   -Zometa infusion today and changed to every 3 months from now -Lab in 6 weeks, and I'll see him back in 3 months -He will see Dr. Debbrah Alar in August  All questions were answered. The  patient knows to call the clinic with any problems, questions or concerns.  I spent 20 minutes counseling the patient face to face. The total time spent in the appointment was 30 minutes and more than 50% was on counseling.     Truitt Merle, MD 06/26/2016

## 2016-06-26 NOTE — Patient Instructions (Signed)

## 2016-06-26 NOTE — Telephone Encounter (Signed)
Gave and printed appt sched and avs fo rpt for Aug and Sept °

## 2016-07-01 DIAGNOSIS — Z23 Encounter for immunization: Secondary | ICD-10-CM | POA: Diagnosis not present

## 2016-07-01 DIAGNOSIS — C9000 Multiple myeloma not having achieved remission: Secondary | ICD-10-CM | POA: Diagnosis not present

## 2016-07-01 DIAGNOSIS — Z9484 Stem cells transplant status: Secondary | ICD-10-CM | POA: Diagnosis not present

## 2016-07-09 ENCOUNTER — Other Ambulatory Visit: Payer: Self-pay | Admitting: *Deleted

## 2016-07-09 DIAGNOSIS — C099 Malignant neoplasm of tonsil, unspecified: Secondary | ICD-10-CM

## 2016-07-09 DIAGNOSIS — C9000 Multiple myeloma not having achieved remission: Secondary | ICD-10-CM

## 2016-07-09 DIAGNOSIS — M549 Dorsalgia, unspecified: Secondary | ICD-10-CM

## 2016-07-09 MED ORDER — LIDOCAINE 5 % EX PTCH: 1.0000 | MEDICATED_PATCH | CUTANEOUS | Status: DC

## 2016-07-16 ENCOUNTER — Other Ambulatory Visit: Payer: Self-pay | Admitting: *Deleted

## 2016-07-16 DIAGNOSIS — C9001 Multiple myeloma in remission: Secondary | ICD-10-CM

## 2016-07-16 MED ORDER — LENALIDOMIDE 5 MG PO CAPS: 5.0000 mg | ORAL_CAPSULE | Freq: Every day | ORAL | Status: DC

## 2016-07-20 DIAGNOSIS — Z880 Allergy status to penicillin: Secondary | ICD-10-CM | POA: Diagnosis not present

## 2016-07-20 DIAGNOSIS — I2510 Atherosclerotic heart disease of native coronary artery without angina pectoris: Secondary | ICD-10-CM | POA: Diagnosis not present

## 2016-07-20 DIAGNOSIS — I129 Hypertensive chronic kidney disease with stage 1 through stage 4 chronic kidney disease, or unspecified chronic kidney disease: Secondary | ICD-10-CM | POA: Diagnosis not present

## 2016-07-20 DIAGNOSIS — Z9484 Stem cells transplant status: Secondary | ICD-10-CM | POA: Diagnosis not present

## 2016-07-20 DIAGNOSIS — I5181 Takotsubo syndrome: Secondary | ICD-10-CM | POA: Diagnosis not present

## 2016-07-20 DIAGNOSIS — Z882 Allergy status to sulfonamides status: Secondary | ICD-10-CM | POA: Diagnosis not present

## 2016-07-20 DIAGNOSIS — Z79899 Other long term (current) drug therapy: Secondary | ICD-10-CM | POA: Diagnosis not present

## 2016-07-20 DIAGNOSIS — Z886 Allergy status to analgesic agent status: Secondary | ICD-10-CM | POA: Diagnosis not present

## 2016-07-20 DIAGNOSIS — Z888 Allergy status to other drugs, medicaments and biological substances status: Secondary | ICD-10-CM | POA: Diagnosis not present

## 2016-07-20 DIAGNOSIS — Z7982 Long term (current) use of aspirin: Secondary | ICD-10-CM | POA: Diagnosis not present

## 2016-07-20 DIAGNOSIS — Z8579 Personal history of other malignant neoplasms of lymphoid, hematopoietic and related tissues: Secondary | ICD-10-CM | POA: Diagnosis not present

## 2016-07-20 DIAGNOSIS — I252 Old myocardial infarction: Secondary | ICD-10-CM | POA: Diagnosis not present

## 2016-07-20 DIAGNOSIS — E785 Hyperlipidemia, unspecified: Secondary | ICD-10-CM | POA: Diagnosis not present

## 2016-07-20 DIAGNOSIS — Z885 Allergy status to narcotic agent status: Secondary | ICD-10-CM | POA: Diagnosis not present

## 2016-07-20 DIAGNOSIS — N189 Chronic kidney disease, unspecified: Secondary | ICD-10-CM | POA: Diagnosis not present

## 2016-07-20 DIAGNOSIS — Z87891 Personal history of nicotine dependence: Secondary | ICD-10-CM | POA: Diagnosis not present

## 2016-08-06 DIAGNOSIS — K760 Fatty (change of) liver, not elsewhere classified: Secondary | ICD-10-CM | POA: Diagnosis not present

## 2016-08-06 DIAGNOSIS — K8020 Calculus of gallbladder without cholecystitis without obstruction: Secondary | ICD-10-CM | POA: Diagnosis not present

## 2016-08-06 DIAGNOSIS — Z1211 Encounter for screening for malignant neoplasm of colon: Secondary | ICD-10-CM | POA: Diagnosis not present

## 2016-08-06 DIAGNOSIS — Z86010 Personal history of colonic polyps: Secondary | ICD-10-CM | POA: Diagnosis not present

## 2016-08-07 ENCOUNTER — Other Ambulatory Visit (HOSPITAL_BASED_OUTPATIENT_CLINIC_OR_DEPARTMENT_OTHER): Payer: Medicare Other

## 2016-08-07 DIAGNOSIS — C9000 Multiple myeloma not having achieved remission: Secondary | ICD-10-CM

## 2016-08-07 DIAGNOSIS — C9001 Multiple myeloma in remission: Secondary | ICD-10-CM | POA: Diagnosis not present

## 2016-08-07 LAB — COMPREHENSIVE METABOLIC PANEL
ALT: 43 U/L (ref 0–55)
ANION GAP: 8 meq/L (ref 3–11)
AST: 29 U/L (ref 5–34)
Albumin: 3.5 g/dL (ref 3.5–5.0)
Alkaline Phosphatase: 139 U/L (ref 40–150)
BILIRUBIN TOTAL: 0.58 mg/dL (ref 0.20–1.20)
BUN: 6.1 mg/dL — AB (ref 7.0–26.0)
CO2: 20 meq/L — AB (ref 22–29)
CREATININE: 1.1 mg/dL (ref 0.7–1.3)
Calcium: 9.2 mg/dL (ref 8.4–10.4)
Chloride: 110 mEq/L — ABNORMAL HIGH (ref 98–109)
EGFR: 69 mL/min/{1.73_m2} — ABNORMAL LOW (ref >90)
GLUCOSE: 91 mg/dL (ref 70–140)
Potassium: 3.4 mEq/L — ABNORMAL LOW (ref 3.5–5.1)
SODIUM: 138 meq/L (ref 136–145)
TOTAL PROTEIN: 6.6 g/dL (ref 6.4–8.3)

## 2016-08-07 LAB — CBC WITH DIFFERENTIAL/PLATELET
BASO%: 0.3 % (ref 0.0–2.0)
Basophils Absolute: 0 10*3/uL (ref 0.0–0.1)
EOS%: 13.4 % — ABNORMAL HIGH (ref 0.0–7.0)
Eosinophils Absolute: 0.6 10*3/uL — ABNORMAL HIGH (ref 0.0–0.5)
HCT: 45.1 % (ref 38.4–49.9)
HGB: 14.8 g/dL (ref 13.0–17.1)
LYMPH%: 14.4 % (ref 14.0–49.0)
MCH: 35.5 pg — ABNORMAL HIGH (ref 27.2–33.4)
MCHC: 32.8 g/dL (ref 32.0–36.0)
MCV: 108.1 fL — ABNORMAL HIGH (ref 79.3–98.0)
MONO#: 0.7 10*3/uL (ref 0.1–0.9)
MONO%: 16.1 % — ABNORMAL HIGH (ref 0.0–14.0)
NEUT%: 55.8 % (ref 39.0–75.0)
NEUTROS ABS: 2.5 10*3/uL (ref 1.5–6.5)
PLATELETS: 172 10*3/uL (ref 140–400)
RBC: 4.17 10*6/uL — AB (ref 4.20–5.82)
RDW: 16 % — AB (ref 11.0–14.6)
WBC: 4.4 10*3/uL (ref 4.0–10.3)
lymph#: 0.6 10*3/uL — ABNORMAL LOW (ref 0.9–3.3)

## 2016-08-10 LAB — KAPPA/LAMBDA LIGHT CHAINS
IG KAPPA FREE LIGHT CHAIN: 13.6 mg/L (ref 3.3–19.4)
Ig Lambda Free Light Chain: 14.1 mg/L (ref 5.7–26.3)
Kappa/Lambda FluidC Ratio: 0.96 (ref 0.26–1.65)

## 2016-08-11 LAB — MULTIPLE MYELOMA PANEL, SERUM
ALBUMIN/GLOB SERPL: 1.6 (ref 0.7–1.7)
ALPHA 1: 0.2 g/dL (ref 0.0–0.4)
Albumin SerPl Elph-Mcnc: 3.7 g/dL (ref 2.9–4.4)
Alpha2 Glob SerPl Elph-Mcnc: 1 g/dL (ref 0.4–1.0)
B-Globulin SerPl Elph-Mcnc: 0.9 g/dL (ref 0.7–1.3)
Gamma Glob SerPl Elph-Mcnc: 0.4 g/dL (ref 0.4–1.8)
Globulin, Total: 2.4 g/dL (ref 2.2–3.9)
IGA/IMMUNOGLOBULIN A, SERUM: 10 mg/dL — AB (ref 61–437)
IGM (IMMUNOGLOBIN M), SRM: 11 mg/dL — AB (ref 20–172)
IgG, Qn, Serum: 530 mg/dL — ABNORMAL LOW (ref 700–1600)
Total Protein: 6.1 g/dL (ref 6.0–8.5)

## 2016-08-14 ENCOUNTER — Other Ambulatory Visit: Payer: Self-pay | Admitting: *Deleted

## 2016-08-14 DIAGNOSIS — C9001 Multiple myeloma in remission: Secondary | ICD-10-CM

## 2016-08-14 MED ORDER — LENALIDOMIDE 5 MG PO CAPS: 5.0000 mg | ORAL_CAPSULE | Freq: Every day | ORAL | 0 refills | Status: DC

## 2016-08-17 DIAGNOSIS — Z9484 Stem cells transplant status: Secondary | ICD-10-CM | POA: Diagnosis not present

## 2016-08-17 DIAGNOSIS — C9000 Multiple myeloma not having achieved remission: Secondary | ICD-10-CM | POA: Diagnosis not present

## 2016-08-19 DIAGNOSIS — Z9484 Stem cells transplant status: Secondary | ICD-10-CM | POA: Diagnosis not present

## 2016-08-19 DIAGNOSIS — Z23 Encounter for immunization: Secondary | ICD-10-CM | POA: Diagnosis not present

## 2016-08-19 DIAGNOSIS — C9000 Multiple myeloma not having achieved remission: Secondary | ICD-10-CM | POA: Diagnosis not present

## 2016-08-20 DIAGNOSIS — Z0000 Encounter for general adult medical examination without abnormal findings: Secondary | ICD-10-CM | POA: Diagnosis not present

## 2016-08-20 DIAGNOSIS — E782 Mixed hyperlipidemia: Secondary | ICD-10-CM | POA: Diagnosis not present

## 2016-08-20 DIAGNOSIS — E538 Deficiency of other specified B group vitamins: Secondary | ICD-10-CM | POA: Diagnosis not present

## 2016-08-20 DIAGNOSIS — K210 Gastro-esophageal reflux disease with esophagitis: Secondary | ICD-10-CM | POA: Diagnosis not present

## 2016-08-20 DIAGNOSIS — I10 Essential (primary) hypertension: Secondary | ICD-10-CM | POA: Diagnosis not present

## 2016-08-20 DIAGNOSIS — N183 Chronic kidney disease, stage 3 (moderate): Secondary | ICD-10-CM | POA: Diagnosis not present

## 2016-08-20 DIAGNOSIS — Z79899 Other long term (current) drug therapy: Secondary | ICD-10-CM | POA: Diagnosis not present

## 2016-08-28 ENCOUNTER — Other Ambulatory Visit: Payer: Self-pay

## 2016-09-16 ENCOUNTER — Other Ambulatory Visit: Payer: Self-pay | Admitting: *Deleted

## 2016-09-16 ENCOUNTER — Telehealth: Payer: Self-pay | Admitting: *Deleted

## 2016-09-16 DIAGNOSIS — C9001 Multiple myeloma in remission: Secondary | ICD-10-CM

## 2016-09-16 MED ORDER — LENALIDOMIDE 5 MG PO CAPS: 5.0000 mg | ORAL_CAPSULE | Freq: Every day | ORAL | 0 refills | Status: DC

## 2016-09-16 NOTE — Telephone Encounter (Signed)
Received ph call from pt stating that he was supposed to let Dr Burr Medico know when he was out of his revlimid.  He states that he is & is supposed to restart on Sat if she wants it refilled.  Message to Dr Feng/Pod RN

## 2016-09-16 NOTE — Telephone Encounter (Signed)
Called patient and let him know that Dr. Burr Medico refilled his revlimid today.

## 2016-09-17 DIAGNOSIS — E538 Deficiency of other specified B group vitamins: Secondary | ICD-10-CM | POA: Diagnosis not present

## 2016-09-17 DIAGNOSIS — Z23 Encounter for immunization: Secondary | ICD-10-CM | POA: Diagnosis not present

## 2016-09-17 DIAGNOSIS — N183 Chronic kidney disease, stage 3 (moderate): Secondary | ICD-10-CM | POA: Diagnosis not present

## 2016-09-17 DIAGNOSIS — E782 Mixed hyperlipidemia: Secondary | ICD-10-CM | POA: Diagnosis not present

## 2016-09-18 ENCOUNTER — Ambulatory Visit (HOSPITAL_BASED_OUTPATIENT_CLINIC_OR_DEPARTMENT_OTHER): Payer: Medicare Other

## 2016-09-18 ENCOUNTER — Telehealth: Payer: Self-pay | Admitting: Hematology

## 2016-09-18 ENCOUNTER — Encounter: Payer: Self-pay | Admitting: Hematology

## 2016-09-18 ENCOUNTER — Ambulatory Visit (HOSPITAL_BASED_OUTPATIENT_CLINIC_OR_DEPARTMENT_OTHER): Payer: Medicare Other | Admitting: Hematology

## 2016-09-18 ENCOUNTER — Other Ambulatory Visit (HOSPITAL_BASED_OUTPATIENT_CLINIC_OR_DEPARTMENT_OTHER): Payer: Medicare Other

## 2016-09-18 VITALS — BP 105/62 | HR 49 | Temp 98.1°F | Resp 18 | Ht 68.0 in | Wt 149.7 lb

## 2016-09-18 DIAGNOSIS — G893 Neoplasm related pain (acute) (chronic): Secondary | ICD-10-CM

## 2016-09-18 DIAGNOSIS — I10 Essential (primary) hypertension: Secondary | ICD-10-CM

## 2016-09-18 DIAGNOSIS — E876 Hypokalemia: Secondary | ICD-10-CM

## 2016-09-18 DIAGNOSIS — I428 Other cardiomyopathies: Secondary | ICD-10-CM

## 2016-09-18 DIAGNOSIS — C9001 Multiple myeloma in remission: Secondary | ICD-10-CM

## 2016-09-18 DIAGNOSIS — G620 Drug-induced polyneuropathy: Secondary | ICD-10-CM | POA: Diagnosis not present

## 2016-09-18 DIAGNOSIS — M4850XD Collapsed vertebra, not elsewhere classified, site unspecified, subsequent encounter for fracture with routine healing: Secondary | ICD-10-CM

## 2016-09-18 DIAGNOSIS — C9000 Multiple myeloma not having achieved remission: Secondary | ICD-10-CM

## 2016-09-18 DIAGNOSIS — IMO0001 Reserved for inherently not codable concepts without codable children: Secondary | ICD-10-CM

## 2016-09-18 DIAGNOSIS — N182 Chronic kidney disease, stage 2 (mild): Secondary | ICD-10-CM

## 2016-09-18 LAB — COMPREHENSIVE METABOLIC PANEL
ALBUMIN: 3.6 g/dL (ref 3.5–5.0)
ALK PHOS: 129 U/L (ref 40–150)
ALT: 37 U/L (ref 0–55)
ANION GAP: 8 meq/L (ref 3–11)
AST: 30 U/L (ref 5–34)
BUN: 7.3 mg/dL (ref 7.0–26.0)
CALCIUM: 9.4 mg/dL (ref 8.4–10.4)
CHLORIDE: 109 meq/L (ref 98–109)
CO2: 23 mEq/L (ref 22–29)
CREATININE: 1.1 mg/dL (ref 0.7–1.3)
EGFR: 70 mL/min/{1.73_m2} — ABNORMAL LOW (ref >90)
Glucose: 90 mg/dl (ref 70–140)
Potassium: 3.3 mEq/L — ABNORMAL LOW (ref 3.5–5.1)
Sodium: 140 mEq/L (ref 136–145)
Total Bilirubin: 0.53 mg/dL (ref 0.20–1.20)
Total Protein: 7 g/dL (ref 6.4–8.3)

## 2016-09-18 LAB — CBC WITH DIFFERENTIAL/PLATELET
BASO%: 0.5 % (ref 0.0–2.0)
BASOS ABS: 0 10*3/uL (ref 0.0–0.1)
EOS%: 3.4 % (ref 0.0–7.0)
Eosinophils Absolute: 0.1 10*3/uL (ref 0.0–0.5)
HEMATOCRIT: 47.5 % (ref 38.4–49.9)
HEMOGLOBIN: 15.6 g/dL (ref 13.0–17.1)
LYMPH#: 0.4 10*3/uL — AB (ref 0.9–3.3)
LYMPH%: 10.7 % — ABNORMAL LOW (ref 14.0–49.0)
MCH: 34.8 pg — AB (ref 27.2–33.4)
MCHC: 32.9 g/dL (ref 32.0–36.0)
MCV: 105.9 fL — ABNORMAL HIGH (ref 79.3–98.0)
MONO#: 0.7 10*3/uL (ref 0.1–0.9)
MONO%: 17 % — ABNORMAL HIGH (ref 0.0–14.0)
NEUT#: 2.8 10*3/uL (ref 1.5–6.5)
NEUT%: 68.4 % (ref 39.0–75.0)
PLATELETS: 182 10*3/uL (ref 140–400)
RBC: 4.49 10*6/uL (ref 4.20–5.82)
RDW: 14.6 % (ref 11.0–14.6)
WBC: 4.2 10*3/uL (ref 4.0–10.3)

## 2016-09-18 MED ORDER — ZOLEDRONIC ACID 4 MG/100ML IV SOLN
4.0000 mg | Freq: Once | INTRAVENOUS | Status: AC
  Administered 2016-09-18: 4 mg via INTRAVENOUS
  Filled 2016-09-18: qty 100

## 2016-09-18 NOTE — Telephone Encounter (Signed)
GAVE PATIENT AVS REPORT AND APPOINTMENTS FOR November AND December  °

## 2016-09-18 NOTE — Progress Notes (Signed)
[place]  Telephone:([phone]) [phone] Fax:(336) [phone]  Clinic Follow Up Note   Patient Care Team: [name], MD as PCP - General (Internal Medicine) Pixie Casino, MD as Consulting Physician (Cardiology) Truitt Merle, MD as Consulting Physician (Hematology) 09/18/2016   CHIEF COMPLAINTS: Follow-up multiple myeloma    Multiple myeloma in remission ([place])   01/31/2015 Tumor Marker    SPEP showed M-SPIKE 1.7g/dl, Cr 1.5, no anemia, hypercalcemia      03/19/2015 Imaging    Bone survey showed no discrete lytic lesion, but there is osteopenia and calvarial heterogeneity.      03/27/2015 Initial Diagnosis    Multiple myeloma, stage I      03/27/2015 Bone Marrow Biopsy    Bone Marrow, Aspirate,Biopsy: - HYPERCELLULAR BONE MARROW FOR AGE WITH PLASMA CELL NEOPLASM 47% - SEE COMMENT. PERIPHERAL BLOOD: - NO SIGNIFICANT MORPHOLOGIC ABNORMALITIES. Diagnosis Note The bone marrow shows increased number of       03/27/2015 Miscellaneous    bone marrow Cytogenetics: normal. FISH panel showed the presence of +4 and +11, this is consider standard risk of MM       04/16/2015 - 04/23/2015 Hospital Admission    He was admitted for worsening back pain, was found to have a T12 pathological fracture. He underwent kyphoplasty on 04/19/2015.      04/19/2015 - 08/02/2015 Chemotherapy    RVD with weekly Velcade 1.3 mg/m, dexamethasone 40 mg, and Revlimid 10 mg daily on day 1-21, every 28 days, cycle 1 Revlimid was interupted by hospitalization, cycle 3 Revlimid dose increased to 25 mg daily due to his improved renal function. total 4 cyc      04/26/2015 - 05/01/2015 Hospital Admission    He was admitted to [facility] for syncope episode during his radiation simulation. EKG showed ST elevation, troponin was positive, he underwent cardio catheterization which showed mild stenosis. No intervention was needed. echo showed EF 35-40%      04/29/2015 - 05/10/2015 Radiation Therapy    palliative  RT to T12, 20Gy in 10 fractions      08/19/2015 Bone Marrow Transplant    He underwent autologous stem cell transplant at [facility].       11/27/2015 Bone Marrow Biopsy    hypocellular marrow (10%), no increased plasma cells       12/29/2015 -  Chemotherapy    maintenance Revlimid 67m daily, 3 weeks on, one week off         HISTORY OF PRESENTING ILLNESS (03/15/2015):  Eric [id number].[id number]. 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  He has been having low back pain for 3-4 months. He had food posinin 4 month ago, and had frequent diarrhea. He started noticed low back pain since then. The pain is not radiating to leg, he also has intermittent right rib pain, worse with cough and sneezing. He remains to be physically active, able to do all the activities, such as gardening work housework without much limitation, but he doesn't sings slowly because of back pain. He takes Tylenol as needed, does not like the necrotic pain medication. No night sweats, no fever or chillss, no weight loss.   He was seen by his primary care physician Dr. RMerrilee Seashore MD and had lab test (see below). He also had bone density scan 2/25 which showed osteoporosis, has not been treated yet.  Current therapy: Maintenance Revlimid 5 mg daily, 3 weeks on, one-week off, started on 12/29/2015  INTERIM HISTORY: Eric Dudley returns for follow-up. He History well overall. He still has intermittent back and rib pains, mild and tolerable, does not limit his activity and he does not take pain medication. He is a very physically active, goes out for fishing, yard work, Social research officer, government. He feels well overall, denies any shortness breast, other new pain, fever, bleeding or other symptoms. His weight is stable. He received a flu shot and a B12 at his primary care physician's office.  MEDICAL HISTORY:  Past  Medical History:  Diagnosis Date  . Cholelithiases   . Chronic kidney disease    MILD,CHRONIC  . Dyslipidemia   . Hyperlipidemia   . Hypertension   . 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 APPENDECTOMY  1962  . CARDIAC CATHETERIZATION N/A 04/30/2015   Procedure: Left Heart Cath and Coronary Angiography;  Surgeon: Peter M Martinique, MD;  Location: [facility] INVASIVE CV LAB CUPID;  Service: Cardiovascular;  Laterality: N/A;  . CARDIOLITE MYOCARDIAL PERFUSION STUDY  04/16/03   NEGITIVE BRUCE PROTOCAL EXERCISE STRESS TEST. EF 70%. NO ISCHEMIA.  [name] KNEE SURGERY  2012   right  . SHOULDER SURGERY  1999   left    SOCIAL HISTORY: History   Social History  . Marital Status: Married    Spouse Name: N/A  . Number of Children: 1   . Years of Education: N/A   Occupational History  . A retired Therapist, occupational    Social History Main Topics  . Smoking status: Former Smoker    Types: Cigars    Quit date: 12/29/2003  . Smokeless tobacco: Former Systems developer    Types: Chew    Quit date: 12/28/1994  . Alcohol Use: Yes     Comment: occasional beer  . Drug Use: No  . Sexual Activity: Not on file   Other Topics Concern  . Not on file   Social History Narrative  . No narrative on file    FAMILY HISTORY: Family History  Problem Relation Age of Onset  . Cancer Mother 57    cervical cancer   . Cancer Father     unknown cancer   . Hypertension Sister   . Cancer Sister     melanoma     ALLERGIES:  is allergic to hydrochlorothiazide; asa [aspirin]; calcium carbonate; oxycodone; amoxicillin; and indapamide.  MEDICATIONS:  Current Outpatient Prescriptions  Medication Sig Dispense Refill  . aspirin 81 MG chewable tablet Chew 1 tablet (81 mg total) by mouth daily.    . calcium carbonate (OS-CAL) 600 MG TABS tablet Take 1,200 mg by mouth daily with breakfast.    . diphenhydrAMINE (BENADRYL) 25 mg capsule Take 25 mg by mouth at bedtime as  needed.    [name] guaiFENesin (MUCINEX) 600 MG 12 hr tablet Take 1,200 mg by mouth.    . lenalidomide (REVLIMID) 5 MG capsule Take 1 capsule (5 mg total) by mouth daily. Take Revlimid  5 mg by mouth daily  For  21 days ,  Rest  7 days. 21 capsule 0  . lidocaine (LIDODERM) 5 % Place 1 patch onto the skin daily. Remove & Discard patch within 12 hours or as directed by MD 30 patch 1  . loperamide (IMODIUM) 2 MG capsule Take 2 mg by mouth 4 (four) times daily as needed. Reported on 04/21/2016    . Multiple  Vitamins-Minerals (CENTRUM SILVER ULTRA MENS PO) Take 1 tablet by mouth daily.    . nitroGLYCERIN (NITROSTAT) 0.4 MG SL tablet Place 0.4 mg under the tongue every 5 (five) minutes x 3 doses as needed. Reported on 05/19/2016    . ondansetron (ZOFRAN) 4 MG tablet Take 1 tablet (4 mg total) by mouth 4 (four) times daily as needed for nausea or vomiting. 30 tablet 2  . polyethylene glycol (MIRALAX / GLYCOLAX) packet Take 17 g by mouth daily as needed.     . potassium chloride (MICRO-K) 10 MEQ CR capsule Take 30 mEq by mouth daily.    . ranitidine (ZANTAC) 150 MG tablet Take 2 tablets (300 mg total) by mouth at bedtime. 60 tablet 1   No current facility-administered medications for this visit.     REVIEW OF SYSTEMS:   Constitutional: Denies fevers, chills or abnormal night sweats Eyes: Denies blurriness of vision, double vision or watery eyes Ears, nose, mouth, throat, and face: Denies mucositis or sore throat Respiratory: Denies cough, dyspnea or wheezes Cardiovascular: Denies palpitation, chest discomfort or lower extremity swelling Gastrointestinal:  Denies nausea, heartburn or change in bowel habits Skin: Denies abnormal skin rashes Lymphatics: Denies new lymphadenopathy or easy bruising Neurological:Denies numbness, tingling or new weaknesses Behavioral/Psych: Mood is stable, no new changes  Musculoskeletal: Positive for back pain and intermittent rib pain All other systems were reviewed with the  patient and are negative.  PHYSICAL EXAMINATION: ECOG PERFORMANCE STATUS: 1 BP 105/62 (BP Location: Left Arm, Patient Position: Sitting)   Pulse (!) 49   Temp 98.1 F (36.7 C) (Oral)   Resp 18   Ht '5\' 8"'  (1.727 m)   Wt 149 lb 11.2 oz (67.9 kg)   SpO2 100%   BMI 22.76 kg/m   GENERAL: He comes in today with a wheelchair, alert and oriented, no distress, chronically ill-appearing SKIN: skin color, texture, turgor are normal, no rashes or significant lesions except to some healing rash on his hands EYES: normal, conjunctiva are pink and non-injected, sclera clear OROPHARYNX:no exudate, no erythema and lips, buccal mucosa, and tongue normal  NECK: supple, thyroid normal size, non-tender, without nodularity LYMPH:  no palpable lymphadenopathy in the cervical, axillary or inguinal LUNGS: clear to auscultation and percussion with normal breathing effort HEART: regular rate & rhythm and no murmurs and no lower extremity edema ABDOMEN:abdomen soft, non-tender and normal bowel sounds Musculoskeletal:no cyanosis of digits and no clubbing, mild tenderness on bilateral lateral chest wall.  PSYCH: alert & oriented x 3 with fluent speech NEURO: no focal motor/sensory deficits CHEST WALL: slightly enlarged left breast, mild tenderness, no palpable mass. He states his left breast is slightly bigger than right for all his adult life.  LABORATORY DATA:   CBC Latest Ref Rng & Units 09/18/2016 08/07/2016 06/26/2016  WBC 4.0 - 10.3 10e3/uL 4.2 4.4 3.4(L)  Hemoglobin 13.0 - 17.1 g/dL 15.6 14.8 15.0  Hematocrit 38.4 - 49.9 % 47.5 45.1 45.8  Platelets 140 - 400 10e3/uL 182 172 180   CMP Latest Ref Rng & Units 09/18/2016 08/07/2016 08/07/2016  Glucose 70 - 140 mg/dl 90 91 -  BUN 7.0 - 26.0 mg/dL 7.3 6.1(L) -  Creatinine 0.7 - 1.3 mg/dL 1.1 1.1 -  Sodium 136 - 145 mEq/L 140 138 -  Potassium 3.5 - 5.1 mEq/L 3.3(L) 3.4(L) -  Chloride 101 - 111 mmol/L - - -  CO2 22 - 29 mEq/L 23 20(L) -  Calcium 8.4 - 10.4  mg/dL 9.4 9.2 -  Total Protein 6.4 - 8.3 g/dL 7.0 6.6 6.1  Total Bilirubin 0.20 - 1.20 mg/dL 0.53 0.58 -  Alkaline Phos 40 - 150 U/L 129 139 -  AST 5 - 34 U/L 30 29 -  ALT 0 - 55 U/L 37 43 -   MM lab: SPEP M-protein (g/dl) 03/15/2015: 1.6 05/24/2015: 0.5 06/21/2015: 0.4 07/18/2015 (at [facility]): 0.33 11/27/2015: not det 01/21/2016: not det  03/24/2016: not det 05/19/2016: not det  08/07/2016: not det   Serum IgG (mg/dl) 03/15/2015: 2000 05/24/2015: 579 06/21/2015: 673 07/18/2015 ([facility]): 509 11/27/2015: 396 01/21/2016: 487 03/24/2016: 563 05/19/2016: 600 08/07/2016: 530  Serum kappa/lamda/ratio (mg/dl)  03/15/2015: 690, 0.13, 5308 05/24/2015: 146, 1.27, 115.0 06/21/2015: 86, 0.74, 116.2 01/21/2016: 1.09, 1.21, 0.9 03/24/2016: 1.72, 1.49, 1.16 05/19/2016: 1.91, 1.50, 1.27 08/07/2016: 1.36, 1.41, 0.96   24 h urine kappa light chain (mg) 03/18/2015: 2434 05/28/2015: 582 06/24/2015: 322 04/27/2016: 499    PATHOLOGY REPORT Bone Marrow, Aspirate,Biopsy, and Clot, right iliac 03/27/2015 - HYPERCELLULAR BONE MARROW FOR AGE WITH PLASMA CELL NEOPLASM. - SEE COMMENT. PERIPHERAL BLOOD: - NO SIGNIFICANT MORPHOLOGIC ABNORMALITIES. Diagnosis Note The bone marrow shows increased number of atypical plasma cells representing 47% of all cells in the aspirate associated with prominent interstitial infiltrates and variably sized aggregates in the clot and biopsy sections. Immunohistochemical stains show that the plasma cells are kappa light chain restricted consistent with plasma cell neoplasm. The background shows trilineage  Cytogenetics: normal FISH panel: +4 and +11  Bone Marrow, Aspirate,Biopsy, and Clot, 11/27/2015 at [facility]: [id number] RECEIVED: 11/27/2015 ORDERING PHYSICIAN: CESAR Melburn Hake , MD PATIENT NAME: Eric Dudley BONE MARROW REPORT   Bone Marrow (BM) and Peripheral Blood (PB) FINAL PATHOLOGIC DIAGNOSIS  BONE MARROW: Hypocellular marrow (10%) with no  increase in plasma cells. See comment.  PERIPHERAL BLOOD: Absolute lymphopenia. See CBC data.  COMMENT: The touch prep is cellular and adequate for evaluation. Rare plasma cells are present (<1%). Blasts are not increased, Myeloid and erythroid cells are present in normal proportions with intact maturation and no overt dysplastic changes. Megakaryocytes are absent. The aspirate smears are aspicular and hemodilute.  Bone marrow and clot sections are variably hypocellular (0-20% range, 10% overall). Scattered interstitial plasma cells are present and comprise <5% of marrow cellularity by CD138 immunohistochemical analysis. Blasts are not increased. Myeloid and erythroid cells are present in normal proportions and have no overt morphologic abnormalities. Megakaryocytes are morphologically within normal limits.  Examination of the peripheral blood reveals absolute lymphopenia. There are no circulating plasma cells or evidence of rouleaux formation.   RADIOGRAPHIC STUDIES: CT anginal chest 09/30/2015 IMPRESSION: 1. No pulmonary embolism. 2. No thoracic aortic aneurysm or dissection. 3. Heart size is normal. No pericardial effusion. 4. Mild scarring/atelectasis at each lung base, probably chronic. Lungs otherwise clear. No evidence of pneumonia. No pleural effusion. 5. Evidence of diffuse multiple myeloma involvement throughout the thoracolumbar spine and multiple ribs bilaterally, with areas of present clinical relevance described below. 6. Compression fracture deformity of the T12 vertebral body, known pathologic fracture, status post treatment with vertebroplasty on 04/19/2015. 7. Milder compression deformity of the overlying T11 vertebral body, approximately 40% compressed anteriorly, also presumably pathologic. This vertebral body appeared essentially normal on fluoroscopic images from the earlier aforementioned vertebroplasty of 04/19/2015. 8. Slight irregularity of the  right lateral sixth rib, with subtle cortical sclerosis suggesting nondisplaced healing fracture. This may be a relatively acute fracture and is probably pathologic in nature related to underlying multiple myeloma. 9. Similar irregularity of  the left lateral fifth rib, suggesting minimally displaced healing fracture. This may also be a relatively acute fracture and is likely pathologic in nature related to underlying multiple myeloma. These acute or subacute rib fractures are the most likely source of patient's bilateral chest pain. Additional incidental findings in the upper abdomen: Fatty infiltration of liver, cholelithiasis without evidence of acute cholecystitis, fairly large amount of stool within the nondistended colon of the upper abdomen (constipation? ).   ASSESSMENT & PLAN:  65 year old Caucasian male with past medical history of hypertension, tonsil cancer status post surgery and radiation 10 years ago, now presented with 3-4 months low back pain and intermittent rib pain. His blood test showed M protein 1.7g/dl.  1. IgG multiple myeloma, stage I, standard risk by cytogenetics (+4 and +11), in remission  -I previously discussed his initial bone marrow biopsy results with him. He has significant amount of plasma cells in the bone marrow 47%.  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 Based on the new notable myeloma diagnostic criteria, he meets the criteria of multiple myeloma. He has normal albumin and miildly elevated beta 2 microglobulin, this is stage I. -His cytogenetics and Fish panel revealed standard risk. -His thoracic and lumbar spine MRI showed pathologic compression fracture at T12, focal myeloma deposits at L3 and T9,  diffuse marrow signal abnormality consistent with myeloma -I recommended induction chemotherapy with VRD regimen, weekly Velcade and dexamethasone, first 2 cycles Revlimid dose reduced to 10 mg daily 3 week on, one-week off, based on his renal function, and it which was increased to full dose from cycle 3. He received a total of 4 cycles of treatment, achieved a very good partial response. Repeat bone marrow on 07/18/2015 showed 5% plasma cells, fish and cytogenetics was normal. -His post transplant evaluation showed complete remission (<1% plasma cell in marrow, undetectable M protein), and he has recovered very well. -He has started maintenance Revlimid 5 mg daily, 3 weeks on, 1 week off, tolerating well, we'll continue. -lab results reviewed with him, his M protein remains to be negative, we'll continue Revlimid, he will start next cycle next week  -He is doing well, will monitoring CBC and CMP every 6 weeks, multiple myeloma labs every 3 months  2.  Transaminitis - he has had elevated ALT and AST, normal bilirubin possible related to medications -Resolved lately. We'll continue monitoring  3. T12 pathologic compression fracture, and diffuse myeloma involvement in bones -Status post kyphoplasty and palliative radiation  -He received palliative Radiation, pain much improved overall. He does complain more mid back and right-sided chest pain daily, likely related to his physical activities. -continue zometa infusion every 3 months, he has been seeing his dentist regularly.  4. nonobstructive CAD, STEMI, Non ischemic CM, Takatsubo, with EF of 35-40% -Patient was initially found to have troponin elevation and anterior apical ST elevation MI and resultant cardiomyopathy on 04/26/15 -He underwent a cardiac catheterization on 5/3 which showed mild diffuse disease, no stents were placed. He is on aspirin -follow up with Dr. Debara Pickett  -Repeat echo before transplant showed EF 55-60% in 11/2015   5.  Peripheral neuropathy, grade 1 -Likely secondary to his bone marrow transplant chemotherapy -Mild and Stable overall, we'll continue observation  6.  Hypokalemia - potassium  3.3 today, he is on potassium 1 tablet daily, I recommend him to increase to 2 tab daily for 5 days, then 1 tab daily -I encouraged him to eat potassium enriched food -we'll continue monitoring  7. Advanced directives -his advanced directives is in EPIC -he is full code.   Plan -lab results reviewed with patient, he will start next cycle of Revlimid next Tuesday (postponed due to delivery issue)   -Zometa infusion today and every 3 months  -Lab in 6 weeks, and I'll see him back in 3 months -He will see Dr. Debbrah Alar in Feb 2018   All questions were answered. The patient knows to call the clinic with any problems, questions or concerns.  I spent 20 minutes counseling the patient face to face. The total time spent in the appointment was 30 minutes and more than 50% was on counseling.     Truitt Merle, MD 09/18/2016

## 2016-09-18 NOTE — Patient Instructions (Signed)

## 2016-10-14 ENCOUNTER — Telehealth: Payer: Self-pay | Admitting: *Deleted

## 2016-10-14 DIAGNOSIS — C9001 Multiple myeloma in remission: Secondary | ICD-10-CM

## 2016-10-14 NOTE — Telephone Encounter (Signed)
"  I need a Revlimid refill.  I took the last pill Monday night and started seven days rest."

## 2016-10-14 NOTE — Telephone Encounter (Signed)
Please refill.   Truitt Merle MD

## 2016-10-15 MED ORDER — LENALIDOMIDE 5 MG PO CAPS: ORAL_CAPSULE | ORAL | 0 refills | Status: DC

## 2016-10-30 ENCOUNTER — Other Ambulatory Visit (HOSPITAL_BASED_OUTPATIENT_CLINIC_OR_DEPARTMENT_OTHER): Payer: Medicare Other

## 2016-10-30 DIAGNOSIS — C9001 Multiple myeloma in remission: Secondary | ICD-10-CM | POA: Diagnosis not present

## 2016-10-30 DIAGNOSIS — C9000 Multiple myeloma not having achieved remission: Secondary | ICD-10-CM

## 2016-10-30 LAB — COMPREHENSIVE METABOLIC PANEL
ALBUMIN: 3.8 g/dL (ref 3.5–5.0)
ALK PHOS: 144 U/L (ref 40–150)
ALT: 35 U/L (ref 0–55)
AST: 26 U/L (ref 5–34)
Anion Gap: 9 mEq/L (ref 3–11)
BUN: 6.1 mg/dL — AB (ref 7.0–26.0)
CHLORIDE: 110 meq/L — AB (ref 98–109)
CO2: 19 meq/L — AB (ref 22–29)
Calcium: 9.4 mg/dL (ref 8.4–10.4)
Creatinine: 1.1 mg/dL (ref 0.7–1.3)
EGFR: 66 mL/min/{1.73_m2} — ABNORMAL LOW (ref >90)
GLUCOSE: 104 mg/dL (ref 70–140)
POTASSIUM: 3.2 meq/L — AB (ref 3.5–5.1)
SODIUM: 138 meq/L (ref 136–145)
Total Bilirubin: 0.73 mg/dL (ref 0.20–1.20)
Total Protein: 7.1 g/dL (ref 6.4–8.3)

## 2016-10-30 LAB — CBC WITH DIFFERENTIAL/PLATELET
BASO%: 0.6 % (ref 0.0–2.0)
Basophils Absolute: 0 10*3/uL (ref 0.0–0.1)
EOS ABS: 0.2 10*3/uL (ref 0.0–0.5)
EOS%: 4.3 % (ref 0.0–7.0)
HCT: 48.4 % (ref 38.4–49.9)
HEMOGLOBIN: 15.8 g/dL (ref 13.0–17.1)
LYMPH%: 18.2 % (ref 14.0–49.0)
MCH: 34 pg — ABNORMAL HIGH (ref 27.2–33.4)
MCHC: 32.7 g/dL (ref 32.0–36.0)
MCV: 104.2 fL — AB (ref 79.3–98.0)
MONO#: 0.4 10*3/uL (ref 0.1–0.9)
MONO%: 9 % (ref 0.0–14.0)
NEUT%: 67.9 % (ref 39.0–75.0)
NEUTROS ABS: 2.8 10*3/uL (ref 1.5–6.5)
Platelets: 199 10*3/uL (ref 140–400)
RBC: 4.65 10*6/uL (ref 4.20–5.82)
RDW: 14.5 % (ref 11.0–14.6)
WBC: 4.1 10*3/uL (ref 4.0–10.3)
lymph#: 0.7 10*3/uL — ABNORMAL LOW (ref 0.9–3.3)

## 2016-11-02 ENCOUNTER — Telehealth: Payer: Self-pay | Admitting: *Deleted

## 2016-11-02 LAB — KAPPA/LAMBDA LIGHT CHAINS
Ig Kappa Free Light Chain: 16 mg/L (ref 3.3–19.4)
Ig Lambda Free Light Chain: 13.2 mg/L (ref 5.7–26.3)
KAPPA/LAMBDA FLC RATIO: 1.21 (ref 0.26–1.65)

## 2016-11-02 NOTE — Telephone Encounter (Signed)
Spoke with pt and informed pt re:  K+ 3.2 from labs done 10/30/16.   Pt stated he is taking Kdur 20 meq daily.  Instructed pt to take Kdur 40 meq daily for 5 days, then go back to 20 meq daily.  Went over dietary foods high in potassium.  Pt voiced understanding.

## 2016-11-03 LAB — MULTIPLE MYELOMA PANEL, SERUM
ALBUMIN SERPL ELPH-MCNC: 3.8 g/dL (ref 2.9–4.4)
ALPHA 1: 0.2 g/dL (ref 0.0–0.4)
Albumin/Glob SerPl: 1.5 (ref 0.7–1.7)
Alpha2 Glob SerPl Elph-Mcnc: 1 g/dL (ref 0.4–1.0)
B-Globulin SerPl Elph-Mcnc: 0.9 g/dL (ref 0.7–1.3)
Gamma Glob SerPl Elph-Mcnc: 0.5 g/dL (ref 0.4–1.8)
Globulin, Total: 2.6 g/dL (ref 2.2–3.9)
IGA/IMMUNOGLOBULIN A, SERUM: 10 mg/dL — AB (ref 61–437)
IGM (IMMUNOGLOBIN M), SRM: 9 mg/dL — AB (ref 20–172)
IgG, Qn, Serum: 589 mg/dL — ABNORMAL LOW (ref 700–1600)
TOTAL PROTEIN: 6.4 g/dL (ref 6.0–8.5)

## 2016-11-08 ENCOUNTER — Other Ambulatory Visit: Payer: Self-pay | Admitting: Hematology

## 2016-11-08 DIAGNOSIS — C9001 Multiple myeloma in remission: Secondary | ICD-10-CM

## 2016-11-10 ENCOUNTER — Telehealth: Payer: Self-pay | Admitting: *Deleted

## 2016-11-10 NOTE — Telephone Encounter (Signed)
Please refill, thanks   Laurinda Carreno MD  

## 2016-11-10 NOTE — Telephone Encounter (Signed)
Call received @ 304  Pt. called in regards to needing a refill on his REVLIMID 5mg  and his ZOFRAN 4mg 

## 2016-11-11 ENCOUNTER — Other Ambulatory Visit: Payer: Self-pay | Admitting: *Deleted

## 2016-11-11 DIAGNOSIS — C9001 Multiple myeloma in remission: Secondary | ICD-10-CM

## 2016-11-11 MED ORDER — ONDANSETRON HCL 4 MG PO TABS: 4.0000 mg | ORAL_TABLET | Freq: Four times a day (QID) | ORAL | 2 refills | Status: DC | PRN

## 2016-11-11 MED ORDER — LENALIDOMIDE 5 MG PO CAPS: ORAL_CAPSULE | ORAL | 0 refills | Status: DC

## 2016-12-07 DIAGNOSIS — E782 Mixed hyperlipidemia: Secondary | ICD-10-CM | POA: Diagnosis not present

## 2016-12-07 DIAGNOSIS — Z79899 Other long term (current) drug therapy: Secondary | ICD-10-CM | POA: Diagnosis not present

## 2016-12-08 ENCOUNTER — Ambulatory Visit (INDEPENDENT_AMBULATORY_CARE_PROVIDER_SITE_OTHER): Payer: Medicare Other | Admitting: Internal Medicine

## 2016-12-08 ENCOUNTER — Other Ambulatory Visit: Payer: Self-pay | Admitting: *Deleted

## 2016-12-08 VITALS — BP 110/78 | HR 58 | Ht 68.0 in | Wt 146.0 lb

## 2016-12-08 DIAGNOSIS — E785 Hyperlipidemia, unspecified: Secondary | ICD-10-CM | POA: Diagnosis not present

## 2016-12-08 DIAGNOSIS — C9001 Multiple myeloma in remission: Secondary | ICD-10-CM

## 2016-12-08 DIAGNOSIS — I428 Other cardiomyopathies: Secondary | ICD-10-CM | POA: Diagnosis not present

## 2016-12-08 DIAGNOSIS — I10 Essential (primary) hypertension: Secondary | ICD-10-CM | POA: Diagnosis not present

## 2016-12-08 MED ORDER — LENALIDOMIDE 5 MG PO CAPS: ORAL_CAPSULE | ORAL | 0 refills | Status: DC

## 2016-12-08 NOTE — Patient Instructions (Addendum)
Your physician has requested that you have an echocardiogram in 1 year, prior to your next appointment. Echocardiography is a painless test that uses sound waves to create images of your heart. It provides your doctor with information about the size and shape of your heart and how well your heart's chambers and valves are working. This procedure takes approximately one hour. There are no restrictions for this procedure.  Your physician wants you to follow-up in: ONE YEAR with Dr. Debara Pickett. You will receive a reminder letter in the mail two months in advance. If you don't receive a letter, please call our office to schedule the follow-up appointment.

## 2016-12-08 NOTE — Progress Notes (Signed)
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 He has been on Vytorin 10/40 mg. Recently, a lipid NMR demonstrated a high particle number at 1323 with an LDL cholesterol of 67, indicating increased risk. He also underwent a [place] heart protocol per your request, which showed an elevated lipoprotein(a) at 155 and elevated insulin level, suggesting early insulin resistance. Hemoglobin was 5.8. APOB levels were mildly elevated as well as APOA1 fractions. However, overall cholesterol control is fairly good. In the past, I had recommended changing him to Crestor; however, apparently he was intolerant to this in the past as well as Lipitor and another statin, which he does not recall. He is on low-dose Diovan for hypertension and had been on Norvasc in the past. The Diovan was apparently started by Dr. Einar Gip prior to my caring for him and I feel that this is a reasonable medicine for blood pressure control. Symptomatically, he denies any chest pain, shortness of breath, palpitations, presyncope, or syncopal symptoms.   I the pleasure see Eric Dudley back in the office today. He was recently hospitalized after presenting with a syncopal event. He was admitted on 4/19 for acute back pain where he was found to have a pathological T12 fracture IR consulted and recommended kyphoplasty which was performed on 04/19/15.Patient came to his regular scheduled injection of Velcade at the cancer center 4/29 and was being mapped for XRT and while being transferred from chair to table he was dropped down below usual 30. Subsequent to this he had an episode wherein he had severe pain and became unresponsive for about 20 seconds. EKG demonstrated  anteroapical ST elevation and he had car catheterization on 04/30/2015. This demonstratedProx RCA lesion, 30% stenosed. Ost LM to LM lesion, 30% stenosed, normal LVEDP. Echocardiography, however demonstrated reduced LV function with an EF of 35-40% and a large anteroapical aneurysm. The etiology of this aneurysm is not quite clear but thought to be related to prior radiation therapy and or chemotherapy for tonsillar cancer. He did suffer an NSTEMI, with troponin elevation to greater than 4. He was placed on aspirin and appropriately Plavix. There was some question as to whether he needed to continue this, however based on the CURE trial data, there is indication for therapy at least one year even in patients who did not have prior stenting. There is also new are data using Brilinta with similar if not better outcome data. Currently he reports some fatigue and had an episode yesterday where he vomited number of times and is somewhat weak today. Blood pressure is low normal.  Eric Dudley returns today for follow-up. He has been diagnosed with multiple myeloma and recently underwent bone marrow transplant. During that time he had congestive heart failure exacerbation. Prior to transplant his EF has improved and in fact a repeat echocardiogram performed on 9 7 2016 demonstrated an EF of 65-70%.  He's not currently on heart failure medication other than low-dose metoprolol XL 12.5 mg daily due to hypotension.  He is also on low-dose aspirin 81 mg and Plavix 75 mg.   As a reminder, there is no significant obstructive disease by cath in April and the dual antiplatelets therapy is for non-STEMI..  Eric Dudley was seen back in the office  today for follow-up. He continues to have symptoms of lower orthostatic hypotension. Orthostatic vitals today indicated a drop of blood pressure by 30 mmHg. He's also noted to be tachycardic. He was dizzy with change in position. As last office visit I stopped his Aldactone which he says  has not made a significant difference in his symptoms. He is on low-dose beta blocker but remains tachycardic. I suspect this may be related to either multiple myeloma or his recent stem cell transplant. He recently had repeat echocardiogram as described above which showed improvement in LV function.  Eric Dudley reports a marked improvement in his orthostatic symptoms on midodrine. We have discontinued all of blood pressure medicines however for some reason he received valsartan in the mail. Fortunately he did not take this medicine. In generally very pleased with the midodrine and its improvement in his blood pressures. He is now able to do most activities and seems to be improving. The etiology of his low blood pressures is not quite clear. I would like to check a repeat limited echo to look for changes in LV function.  05/27/2016  Eric Dudley returns today for follow-up. He is doing exceedingly well after his bone marrow transplant. He is no longer struggling with hypotension and is come off of midodrine. Blood pressure is normal today with a heart rate in the upper 50s. He is not back on any blood pressure medications. As previously mentioned his LVEF has normalized. He reports some mild shortness of breath but that continues to improve day by day. His strength is also getting better.  12/08/2016  Eric Dudley was seen today in follow-up. Overall he is doing exceedingly well. As recently reported his LVEF has normalized on echo. He has since been very active including going flyfishing and dear hunting in [place]. He is no longer having problems with hypotension. Blood pressure has normalized and heart rate remains in the upper 50s. This is despite any medications for heart failure and I suspect he may have had stress-induced cardiomyopathy.  PMHx:  Past Medical History:  Diagnosis Date  . Cholelithiases   . Chronic kidney disease    MILD,CHRONIC  . Dyslipidemia   . Hyperlipidemia   . Hypertension     . Tonsillar cancer ([place]) 2006    Past Surgical History:  Procedure Laterality Date  . APPENDECTOMY  1962  . CARDIAC CATHETERIZATION N/A 04/30/2015   Procedure: Left Heart Cath and Coronary Angiography;  Surgeon: Peter M Martinique, MD;  Location: [facility] INVASIVE CV LAB CUPID;  Service: Cardiovascular;  Laterality: N/A;  . CARDIOLITE MYOCARDIAL PERFUSION STUDY  04/16/03   NEGITIVE BRUCE PROTOCAL EXERCISE STRESS TEST. EF 70%. NO ISCHEMIA.  [name] KNEE SURGERY  2012   right  . SHOULDER SURGERY  1999   left    FAMHx:  Family History  Problem Relation Age of Onset  . Cancer Mother 53    cervical cancer   . Cancer Father     unknown cancer   . Hypertension Sister   . Cancer Sister     melanoma     SOCHx:   reports that he quit smoking about 12 years ago. His smoking use included Cigars. He quit smokeless tobacco use about 21 years ago. His smokeless tobacco use included Chew. He reports that he drinks alcohol. He reports that he does not use drugs.  ALLERGIES:  Allergies  Allergen Reactions  . Hydrochlorothiazide Rash and Palpitations  . Asa [Aspirin] Nausea Only  . Calcium Carbonate Nausea And  Vomiting  . Oxycodone Other (See Comments)    Patient does not want to take. Severe Hallucinations.  . Amoxicillin Itching and Rash  . Indapamide Palpitations    Lightheadedness, dizziness    ROS: Pertinent items noted in HPI and remainder of comprehensive ROS otherwise negative.  HOME MEDS: Current Outpatient Prescriptions  Medication Sig Dispense Refill  . aspirin 81 MG chewable tablet Chew 1 tablet (81 mg total) by mouth daily.    . calcium carbonate (OS-CAL) 600 MG TABS tablet Take 1,200 mg by mouth daily with breakfast.    . diphenhydrAMINE (BENADRYL) 25 mg capsule Take 25 mg by mouth at bedtime as needed.    . ezetimibe-simvastatin (VYTORIN) 10-40 MG tablet Take 1 tablet by mouth daily at 6 PM.     . guaiFENesin (MUCINEX) 600 MG 12 hr tablet Take 1,200 mg by mouth.    . lenalidomide  (REVLIMID) 5 MG capsule Take Revlimid  5 mg by mouth daily  For  21 days ,  Rest  7 days. 21 capsule 0  . lidocaine (LIDODERM) 5 % Place 1 patch onto the skin daily. Remove & Discard patch within 12 hours or as directed by MD 30 patch 1  . loperamide (IMODIUM) 2 MG capsule Take 2 mg by mouth 4 (four) times daily as needed. Reported on 04/21/2016    . Multiple Vitamins-Minerals (CENTRUM SILVER ULTRA MENS PO) Take 1 tablet by mouth daily.    . nitroGLYCERIN (NITROSTAT) 0.4 MG SL tablet Place 0.4 mg under the tongue every 5 (five) minutes x 3 doses as needed. Reported on 05/19/2016    . ondansetron (ZOFRAN) 4 MG tablet Take 1 tablet (4 mg total) by mouth 4 (four) times daily as needed for nausea or vomiting. 30 tablet 2  . polyethylene glycol (MIRALAX / GLYCOLAX) packet Take 17 g by mouth daily as needed.     . potassium chloride (MICRO-K) 10 MEQ CR capsule Take 30 mEq by mouth daily.    . ranitidine (ZANTAC) 150 MG tablet Take 2 tablets (300 mg total) by mouth at bedtime. 60 tablet 1  . Zoledronic Acid (ZOMETA) 4 MG/100ML IVPB Inject 4 mg into the vein. EVERY 90 DAYS     No current facility-administered medications for this visit.     LABS/IMAGING: No results found for this or any previous visit (from the past 48 hour(s)). No results found.  VITALS: BP 110/78   Pulse (!) 58   Ht '5\' 8"'  (1.727 m)   Wt 146 lb (66.2 kg)   BMI 22.20 kg/m   EXAM: General appearance: alert Neck: no carotid bruit and no JVD Lungs: clear to auscultation bilaterally Heart: regular rate and rhythm, S1, S2 normal, no murmur, click, rub or gallop Abdomen: soft, non-tender; bowel sounds normal; no masses,  no organomegaly Extremities: extremities normal, atraumatic, no cyanosis or edema Pulses: 2+ and symmetric Skin: Skin color, texture, turgor normal. No rashes or lesions Neurologic: Grossly normal  EKG: Sinus bradycardia at 58  ASSESSMENT: 1. Nonischemic cardiomyopathy, EF 35-40% - improved to 65-70% by echo  at [facility] on 09/04/15 2. 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 Hypertension 4. Dyslipidemia 5. History of tonsillar cancer 6. Multiple myeloma-s/p BMT 7. CKD2 8. Orthostatic hypotension - resolved, off of midodrine  PLAN: 1.   Eric Dudley is now markedly improved and is quite active and able to do physical activities. Blood pressure is well controlled and not on medications. He was previously on low-dose beta blocker however heart rate  is in the upper 50s and his EF has normalized without medications. We'll continue him off of medications for the time being and if there is any change in LV function he will likely need to go back on heart failure medications. Plan a repeat echo in one year follow-up at that time.  Pixie Casino, MD, [facility] Attending Cardiologist [place] 12/08/2016, 1:00 PM

## 2016-12-09 ENCOUNTER — Telehealth: Payer: Self-pay

## 2016-12-09 NOTE — Telephone Encounter (Signed)
Please refill  Truitt Merle MD

## 2016-12-09 NOTE — Telephone Encounter (Signed)
Pt called requesting a refill on Revlimid.  Routed to Pod 1.

## 2016-12-14 DIAGNOSIS — E782 Mixed hyperlipidemia: Secondary | ICD-10-CM | POA: Diagnosis not present

## 2016-12-14 DIAGNOSIS — I10 Essential (primary) hypertension: Secondary | ICD-10-CM | POA: Diagnosis not present

## 2016-12-14 DIAGNOSIS — N183 Chronic kidney disease, stage 3 (moderate): Secondary | ICD-10-CM | POA: Diagnosis not present

## 2016-12-14 DIAGNOSIS — E538 Deficiency of other specified B group vitamins: Secondary | ICD-10-CM | POA: Diagnosis not present

## 2016-12-15 NOTE — Progress Notes (Signed)
[place]  Telephone:([phone]) [phone] Fax:([phone]) [phone]  Clinic Follow Up Note   Patient Care Team: [name], MD as PCP - General (Internal Medicine) Pixie Casino, MD as Consulting Physician (Cardiology) Truitt Merle, MD as Consulting Physician (Hematology) 12/18/2016   CHIEF COMPLAINTS: Follow-up multiple myeloma    Multiple myeloma in remission ([place])   01/31/2015 Tumor Marker    SPEP showed M-SPIKE 1.7g/dl, Cr 1.5, no anemia, hypercalcemia      03/19/2015 Imaging    Bone survey showed no discrete lytic lesion, but there is osteopenia and calvarial heterogeneity.      03/27/2015 Initial Diagnosis    Multiple myeloma, stage I      03/27/2015 Bone Marrow Biopsy    Bone Marrow, Aspirate,Biopsy: - HYPERCELLULAR BONE MARROW FOR AGE WITH PLASMA CELL NEOPLASM 47% - SEE COMMENT. PERIPHERAL BLOOD: - NO SIGNIFICANT MORPHOLOGIC ABNORMALITIES. Diagnosis Note The bone marrow shows increased number of       03/27/2015 Miscellaneous    bone marrow Cytogenetics: normal. FISH panel showed the presence of +4 and +11, this is consider standard risk of MM       04/16/2015 - 04/23/2015 Hospital Admission    He was admitted for worsening back pain, was found to have a T12 pathological fracture. He underwent kyphoplasty on 04/19/2015.      04/17/2015 Imaging    MR Thoracic and Lumbar Spine w/o Contrast 04/17/2015 IMPRESSION: MR THORACIC SPINE IMPRESSION: Pathologic compression fracture at T8 in this patient with widespread multiple myeloma. Slight retropulsion of abnormal bone without conus compression. MR LUMBAR SPINE IMPRESSION: Focal myeloma deposits at L3 and T9 without significant compression deformity. Mild stenosis at L4-5 related to ordinary spondylosis.      04/19/2015 - 08/02/2015 Chemotherapy    RVD with weekly Velcade 1.3 mg/m, dexamethasone 40 mg, and Revlimid 10 mg daily on day 1-21, every 28 days, cycle 1 Revlimid was interupted by hospitalization, cycle  3 Revlimid dose increased to 25 mg daily due to his improved renal function. total 4 cyc      04/26/2015 - 05/01/2015 Hospital Admission    He was admitted to [facility] for syncope episode during his radiation simulation. EKG showed ST elevation, troponin was positive, he underwent cardio catheterization which showed mild stenosis. No intervention was needed. echo showed EF 35-40%      04/29/2015 - 05/10/2015 Radiation Therapy    palliative RT to T12, 20Gy in 10 fractions      08/19/2015 Bone Marrow Transplant    He underwent autologous stem cell transplant at [facility].       09/30/2015 Imaging    CT anginal chest 09/30/2015 IMPRESSION: 1. No pulmonary embolism. 2. No thoracic aortic aneurysm or dissection. 3. Heart size is normal. No pericardial effusion. 4. Mild scarring/atelectasis at each lung base, probably chronic. Lungs otherwise clear. No evidence of pneumonia. No pleural effusion. 5. Evidence of diffuse multiple myeloma involvement throughout the thoracolumbar spine and multiple ribs bilaterally, with areas of present clinical relevance described below. 6. Compression fracture deformity of the T12 vertebral body, known pathologic fracture, status post treatment with vertebroplasty on 04/19/2015. 7. Milder compression deformity of the overlying T11 vertebral body, approximately 40% compressed anteriorly, also presumably pathologic. This vertebral body appeared essentially normal on fluoroscopic images from the earlier aforementioned vertebroplasty of 04/19/2015. 8. Slight irregularity of the right lateral sixth rib, with subtle cortical sclerosis suggesting nondisplaced healing fracture. This may be a relatively acute fracture and is probably pathologic in nature  related to underlying multiple myeloma. 9. Similar irregularity of the left lateral fifth rib, suggesting minimally displaced healing fracture. This may also be a relatively acute fracture and is  likely pathologic in nature related to underlying multiple myeloma. These acute or subacute rib fractures are the most likely source of patient's bilateral chest pain. Additional incidental findings in the upper abdomen: Fatty infiltration of liver, cholelithiasis without evidence of acute cholecystitis, fairly large amount of stool within the nondistended colon of the upper abdomen (constipation?).      11/27/2015 Bone Marrow Biopsy    hypocellular marrow (10%), no increased plasma cells       12/29/2015 -  Chemotherapy    maintenance Revlimid 84m daily, 3 weeks on, one week off         HISTORY OF PRESENTING ILLNESS (03/15/2015):  Eric Gitelman65y.o. 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  He has been having low back pain for 3-4 months. He had food posinin 4 month ago, and had frequent diarrhea. He started noticed low back pain since then. The pain is not radiating to leg, he also has intermittent right rib pain, worse with cough and sneezing. He remains to be physically active, able to do all the activities, such as gardening work housework without much limitation, but he doesn't sings slowly because of back pain. He takes Tylenol as needed, does not like the necrotic pain medication. No night sweats, no fever or chillss, no weight loss.   He was seen by his primary care physician Dr. RMerrilee Seashore MD and had lab test (see below). He also had bone density scan 2/25 which showed osteoporosis, has not been treated yet.  Current therapy: Maintenance Revlimid 5 mg daily, 3 weeks on, one-week off, started on 12/29/2015  INTERIM HISTORY: Mr. TBramblettreturns for follow-up. Reports good energy that fades quickly. Reports back and rib pain that is intermittent and tolerable. Rports slightly worse neuropathy in his fingers and feet as a tingling sensation. Reports  stumbling, but not falling. Reports staying active through fishing and yard work. Reports his last dose of Revlimid was last night. The patient states his potassium tablets are hard to swallow.  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 EF 70%. NO ISCHEMIA.   KNEE SURGERY  2012   right   SHOULDER SURGERY  1999   left    SOCIAL HISTORY: History   Social History   Marital Status: Married    Spouse Name: N/A   Number of Children: 1    Years of Education: N/A   Occupational History   A retired mTherapist, occupational   Social History Main Topics   Smoking status: Former Smoker    Types: Cigars    Quit date: 12/29/2003   Smokeless tobacco: Former [employer] developer  Types: Sarina Ser    Quit date: 12/28/1994   Alcohol Use: Yes     Comment: occasional beer   Drug Use: No   Sexual Activity: Not on file   Other Topics Concern   Not on file   Social History Narrative   No narrative on file    FAMILY HISTORY: Family History  Problem Relation Age of Onset   Cancer Mother 79    cervical cancer    Cancer Father     unknown cancer    Hypertension Sister    Cancer Sister     melanoma     ALLERGIES:  is allergic to hydrochlorothiazide; asa [aspirin]; calcium carbonate; oxycodone; amoxicillin; and indapamide.  MEDICATIONS:  Current Outpatient Prescriptions  Medication Sig Dispense Refill   aspirin 81 MG chewable tablet  Chew 1 tablet (81 mg total) by mouth daily.     calcium carbonate (OS-CAL) 600 MG TABS tablet Take 1,200 mg by mouth daily with breakfast.     diphenhydrAMINE (BENADRYL) 25 mg capsule Take 25 mg by mouth at bedtime as needed.     guaiFENesin (MUCINEX) 600 MG 12 hr tablet Take 1,200 mg by mouth.     lenalidomide (REVLIMID) 5 MG capsule Take Revlimid  5 mg by mouth daily  For  21 days ,  Rest  7 days. 21 capsule 0   lidocaine (LIDODERM) 5 % Place 1 patch onto the skin daily. Remove & Discard patch within 12 hours or as directed by MD 30 patch 1   loperamide (IMODIUM) 2 MG capsule Take 2 mg by mouth 4 (four) times daily as needed. Reported on 04/21/2016     Multiple Vitamins-Minerals (CENTRUM SILVER ULTRA MENS PO) Take 1 tablet by mouth daily.     nitroGLYCERIN (NITROSTAT) 0.4 MG SL tablet Place 0.4 mg under the tongue every 5 (five) minutes x 3 doses as needed. Reported on 05/19/2016     ondansetron (ZOFRAN) 4 MG tablet Take 1 tablet (4 mg total) by mouth 4 (four) times daily as needed for nausea or vomiting. 30 tablet 2   polyethylene glycol (MIRALAX / GLYCOLAX) packet Take 17 g by mouth daily as needed.      potassium chloride (MICRO-K) 10 MEQ CR capsule Take 3 capsules (30 mEq total) by mouth daily. 90 capsule 2   ranitidine (ZANTAC) 150 MG tablet Take 2 tablets (300 mg total) by mouth at bedtime. 60 tablet 1   Zoledronic Acid (ZOMETA) 4 MG/100ML IVPB Inject 4 mg into the vein. EVERY 90 DAYS     atorvastatin (LIPITOR) 40 MG tablet Take 40 mg by mouth daily.     clopidogrel (PLAVIX) 75 MG tablet      gabapentin (NEURONTIN) 100 MG capsule Take 1 capsule (100 mg total) by mouth 3 (three) times daily. 90 capsule 1   pantoprazole (PROTONIX) 40 MG tablet      potassium chloride (MICRO-K) 10 MEQ CR capsule Take 20 mEq by mouth daily.     prochlorperazine (COMPAZINE) 10 MG tablet      spironolactone (ALDACTONE) 25 MG tablet      No current facility-administered medications for this  visit.     REVIEW OF SYSTEMS:   Constitutional: Denies fevers, chills or abnormal night sweats Eyes: Denies blurriness of vision, double vision or watery eyes Ears, nose, mouth, throat, and face: Denies mucositis or sore throat Respiratory: Denies cough, dyspnea or wheezes Cardiovascular: Denies palpitation, chest discomfort or lower extremity swelling Gastrointestinal:  Denies nausea, heartburn or change in  bowel habits Skin: Denies abnormal skin rashes Lymphatics: Denies new lymphadenopathy or easy bruising Neurological: (+) Tingling in his fingers and feet. Behavioral/Psych: Mood is stable, no new changes  Musculoskeletal: Positive for back pain and intermittent rib pain, overall mild and manageable with pain medications. All other systems were reviewed with the patient and are negative.  PHYSICAL EXAMINATION: ECOG PERFORMANCE STATUS: 1 BP 116/62 (BP Location: Right Arm, Patient Position: Sitting)    Pulse (!) 56    Temp 98.2 F (36.8 C) (Oral)    Resp 18    Ht _0  (1.727 m)    Wt 147 lb 6.4 oz (66.9 kg)    SpO2 100%    BMI 22.41 kg/m   GENERAL: He comes in today with a wheelchair, alert and oriented, no distress, chronically ill-appearing SKIN: skin color, texture, turgor are normal, no rashes or significant lesions except to some healing rash on his hands EYES: normal, conjunctiva are pink and non-injected, sclera clear OROPHARYNX:no exudate, no erythema and lips, buccal mucosa, and tongue normal  NECK: supple, thyroid normal size, non-tender, without nodularity LYMPH:  no palpable lymphadenopathy in the cervical, axillary or inguinal LUNGS: clear to auscultation and percussion with normal breathing effort HEART: regular rate & rhythm and no murmurs and no lower extremity edema ABDOMEN:abdomen soft, non-tender and normal bowel sounds Musculoskeletal:no cyanosis of digits and no clubbing, mild tenderness on bilateral lateral chest wall.  PSYCH: alert & oriented x 3 with fluent  speech NEURO: no focal motor/sensory deficits CHEST WALL: slightly enlarged left breast, mild tenderness, no palpable mass. He states his left breast is slightly bigger than right for all his adult life.  LABORATORY DATA:   CBC Latest Ref Rng & Units 12/18/2016 10/30/2016 09/18/2016  WBC 4.0 - 10.3 10e3/uL 4.1 4.1 4.2  Hemoglobin 13.0 - 17.1 g/dL 15.3 15.8 15.6  Hematocrit 38.4 - 49.9 % 46.5 48.4 47.5  Platelets 140 - 400 10e3/uL 176 199 182   CMP Latest Ref Rng & Units 12/18/2016 10/30/2016 10/30/2016  Glucose 70 - 140 mg/dl 97 104 -  BUN 7.0 - 26.0 mg/dL 7.5 6.1(L) -  Creatinine 0.7 - 1.3 mg/dL 1.2 1.1 -  Sodium 136 - 145 mEq/L 139 138 -  Potassium 3.5 - 5.1 mEq/L 3.4(L) 3.2(L) -  Chloride 101 - 111 mmol/L - - -  CO2 22 - 29 mEq/L 25 19(L) -  Calcium 8.4 - 10.4 mg/dL 9.6 9.4 -  Total Protein 6.0 - 8.5 g/dL - 7.1 6.4  Total Bilirubin 0.20 - 1.20 mg/dL - 0.73 -  Alkaline Phos 40 - 150 U/L - 144 -  AST 5 - 34 U/L - 26 -  ALT 0 - 55 U/L - 35 -   MM lab: SPEP M-protein (g/dl) 03/15/2015: 1.6 05/24/2015: 0.5 06/21/2015: 0.4 07/18/2015 (at [facility]): 0.33 11/27/2015: not det 01/21/2016: not det  03/24/2016: not det 05/19/2016: not det  08/07/2016: not det  10/30/2016: not det  Serum IgG (mg/dl) 03/15/2015: 2000 05/24/2015: 579 06/21/2015: 673 07/18/2015 ([facility]): 509 11/27/2015: 396 01/21/2016: 487 03/24/2016: 563 05/19/2016: 600 08/07/2016: 530 10/30/2016: 589  Serum kappa/lamda/ratio (mg/dl)  03/15/2015: 690, 0.13, 5308 05/24/2015: 146, 1.27, 115.0 06/21/2015: 86, 0.74, 116.2 01/21/2016: 1.09, 1.21, 0.9 03/24/2016: 1.72, 1.49, 1.16 05/19/2016: 1.91, 1.50, 1.27 08/07/2016: 1.36, 1.41, 0.96 10/30/2016: 1.6, 1.32, 0.121  24 h urine kappa light chain (mg) 03/18/2015: 2434 05/28/2015: 582 06/24/2015: 322 04/27/2016: 499   PATHOLOGY REPORT Bone Marrow, Aspirate,Biopsy, and Clot, right iliac 03/27/2015 - HYPERCELLULAR BONE MARROW FOR AGE  WITH PLASMA CELL NEOPLASM. - SEE COMMENT. PERIPHERAL  BLOOD: - NO SIGNIFICANT MORPHOLOGIC ABNORMALITIES. Diagnosis Note The bone marrow shows increased number of atypical plasma cells representing 47% of all cells in the aspirate associated with prominent interstitial infiltrates and variably sized aggregates in the clot and biopsy sections. Immunohistochemical stains show that the plasma cells are kappa light chain restricted consistent with plasma cell neoplasm. The background shows trilineage  Cytogenetics: normal FISH panel: +4 and +11  Bone Marrow, Aspirate,Biopsy, and Clot, 11/27/2015 at [place]: [id number] RECEIVED: 11/27/2015 ORDERING PHYSICIAN: CESAR Melburn Hake , MD PATIENT NAME: Eric Dudley BONE MARROW REPORT   Bone Marrow (BM) and Peripheral Blood (PB) FINAL PATHOLOGIC DIAGNOSIS  BONE MARROW: Hypocellular marrow (10%) with no increase in plasma cells. See comment.  PERIPHERAL BLOOD: Absolute lymphopenia. See CBC data.  COMMENT: The touch prep is cellular and adequate for evaluation. Rare plasma cells are present (<1%). Blasts are not increased, Myeloid and erythroid cells are present in normal proportions with intact maturation and no overt dysplastic changes. Megakaryocytes are absent. The aspirate smears are aspicular and hemodilute.  Bone marrow and clot sections are variably hypocellular (0-20% range, 10% overall). Scattered interstitial plasma cells are present and comprise <5% of marrow cellularity by CD138 immunohistochemical analysis. Blasts are not increased. Myeloid and erythroid cells are present in normal proportions and have no overt morphologic abnormalities. Megakaryocytes are morphologically within normal limits.  Examination of the peripheral blood reveals absolute lymphopenia. There are no circulating plasma cells or evidence of rouleaux formation.   RADIOGRAPHIC STUDIES: CT anginal chest 09/30/2015 IMPRESSION: 1. No pulmonary embolism. 2. No thoracic aortic  aneurysm or dissection. 3. Heart size is normal. No pericardial effusion. 4. Mild scarring/atelectasis at each lung base, probably chronic. Lungs otherwise clear. No evidence of pneumonia. No pleural effusion. 5. Evidence of diffuse multiple myeloma involvement throughout the thoracolumbar spine and multiple ribs bilaterally, with areas of present clinical relevance described below. 6. Compression fracture deformity of the T12 vertebral body, known pathologic fracture, status post treatment with vertebroplasty on 04/19/2015. 7. Milder compression deformity of the overlying T11 vertebral body, approximately 40% compressed anteriorly, also presumably pathologic. This vertebral body appeared essentially normal on fluoroscopic images from the earlier aforementioned vertebroplasty of 04/19/2015. 8. Slight irregularity of the right lateral sixth rib, with subtle cortical sclerosis suggesting nondisplaced healing fracture. This may be a relatively acute fracture and is probably pathologic in nature related to underlying multiple myeloma. 9. Similar irregularity of the left lateral fifth rib, suggesting minimally displaced healing fracture. This may also be a relatively acute fracture and is likely pathologic in nature related to underlying multiple myeloma. These acute or subacute rib fractures are the most likely source of patient's bilateral chest pain. Additional incidental findings in the upper abdomen: Fatty infiltration of liver, cholelithiasis without evidence of acute cholecystitis, fairly large amount of stool within the nondistended colon of the upper abdomen (constipation?).  MR Thoracic and Lumbar Spine w/o Contrast 04/17/2015 IMPRESSION: MR THORACIC SPINE IMPRESSION: Pathologic compression fracture at T8 in this patient with widespread multiple myeloma. Slight retropulsion of abnormal bone without conus compression. MR LUMBAR SPINE IMPRESSION: Focal myeloma deposits at L3  and T9 without significant compression deformity. Mild stenosis at L4-5 related to ordinary spondylosis.   ASSESSMENT & PLAN:  65 y.o. Caucasian male with past medical history of hypertension, tonsil cancer status post surgery and radiation 10 years ago, now presented with 3-4 months low back pain and intermittent rib pain. His blood  test showed M protein 1.7g/dl.  1. IgG multiple myeloma, stage I, standard risk by cytogenetics (+4 and +11), in remission  -I previously discussed his initial bone marrow biopsy results with him. He has significant amount of plasma cells in the bone marrow 47%.  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 Based on the new notable myeloma diagnostic criteria, he meets the criteria of multiple myeloma. He has normal albumin and miildly elevated beta 2 microglobulin, this is stage I. -His cytogenetics and Fish panel revealed standard risk. -His thoracic and lumbar spine MRI from 04/17/2015 showed pathologic compression fracture at T12, focal myeloma deposits at L3 and T9, diffuse marrow signal abnormality consistent with myeloma -I previously recommended induction chemotherapy with VRD regimen, weekly Velcade and dexamethasone, first 2 cycles Revlimid dose reduced to 10 mg daily 3 week on, one-week off, based on his renal function, and it which was increased to full dose from cycle 3. He received a total of 4 cycles of treatment, achieved a very good partial response. Repeat bone marrow on 07/18/2015 showed 5% plasma cells, fish and cytogenetics was normal. -His post transplant evaluation showed complete remission (<1% plasma cell in marrow, undetectable M protein), and he has recovered very well. -He has started maintenance  Revlimid 5 mg daily, 3 weeks on, 1 week off, tolerating well, we'll continue. -lab results reviewed with him, his M protein remains to be negative, we'll continue Revlimid, he started his cycle on 12/16/16. -He is doing well, will monitoring his lab   2.  Transaminitis - he has had elevated ALT and AST, normal bilirubin possible related to medications -Resolved lately. We'll continue monitoring  3. T12 pathologic compression fracture, and diffuse myeloma involvement in bones -Status post kyphoplasty and palliative radiation  -He received palliative radiation, pain much improved overall. He does complain more mid back and right-sided chest pain daily, likely related to his physical activities. -continue zometa infusion every 3 months, he has been seeing his dentist regularly.  4. nonobstructive CAD, STEMI, Non ischemic CM, Takatsubo, with EF of 35-40% -Patient was initially found to have troponin elevation and anterior apical ST elevation MI and resultant cardiomyopathy on 04/26/15 -He underwent a cardiac catheterization on 5/3 which showed mild diffuse disease, no stents were placed. He is on aspirin -follow up with Dr. Debara Pickett  -Repeat echo before transplant showed EF 55-60% in 11/2015   5. Peripheral neuropathy, grade 1-2 -Likely secondary to his bone marrow transplant chemotherapy -Slightly worse today. I recommend him to try Neurontin, starting from low dose 100 mg 2-3 times a day, evening only on the first week, he can titrate dose up to 251m 3 times a day Neurontin has been prescribed.  6.  Hypokalemia - potassium 3.3 in late March 2017, he is on potassium 1 tablet daily, I recommended him to increase to 2 tab daily for 5 days, then 1 tab daily -potassium 3.4 today  -I encouraged him to eat potassium enriched food -we'll continue monitoring -The patient states his potassium tablet is hard to swallow. I refilled MCRO-K 10 MEQ CR capsules today.  7. Advanced directives -his advanced  directives is in EPIC -he is full code.   PLAN -lab results reviewed with patient, he started his cycle of Revlimid on 12/16/16. -  Zometa infusion today and every 3 months  -Lab, f/u, and Zometa in 3 months.  -Neurontin prescribed today. KCl (MICRO-K) 10 MEQ CR capsule prescribed today. -He will see Dr. Debbrah Alar in Feb 2018    All questions were answered. The patient knows to call the clinic with any problems, questions or concerns.  I spent 20 minutes counseling the patient face to face. The total time spent in the appointment was 30 minutes and more than 50% was on counseling.     Truitt Merle, MD 12/18/2016     This document serves as a record of services personally performed by Truitt Merle, MD. It was created on her behalf by Darcus Austin, a trained medical scribe. The creation of this record is based on the scribe's personal observations and the provider's statements to them. This document has been checked and approved by the attending provider.

## 2016-12-18 ENCOUNTER — Ambulatory Visit (HOSPITAL_BASED_OUTPATIENT_CLINIC_OR_DEPARTMENT_OTHER): Payer: Medicare Other

## 2016-12-18 ENCOUNTER — Ambulatory Visit: Payer: Medicare Other

## 2016-12-18 ENCOUNTER — Other Ambulatory Visit (HOSPITAL_BASED_OUTPATIENT_CLINIC_OR_DEPARTMENT_OTHER): Payer: Medicare Other

## 2016-12-18 ENCOUNTER — Encounter: Payer: Self-pay | Admitting: Hematology

## 2016-12-18 ENCOUNTER — Ambulatory Visit (HOSPITAL_BASED_OUTPATIENT_CLINIC_OR_DEPARTMENT_OTHER): Payer: Medicare Other | Admitting: Hematology

## 2016-12-18 ENCOUNTER — Telehealth: Payer: Self-pay | Admitting: Hematology

## 2016-12-18 VITALS — BP 116/62 | HR 56 | Temp 98.2°F | Resp 18 | Ht 68.0 in | Wt 147.4 lb

## 2016-12-18 DIAGNOSIS — G893 Neoplasm related pain (acute) (chronic): Secondary | ICD-10-CM

## 2016-12-18 DIAGNOSIS — C9001 Multiple myeloma in remission: Secondary | ICD-10-CM

## 2016-12-18 DIAGNOSIS — G620 Drug-induced polyneuropathy: Secondary | ICD-10-CM

## 2016-12-18 DIAGNOSIS — E876 Hypokalemia: Secondary | ICD-10-CM | POA: Diagnosis not present

## 2016-12-18 DIAGNOSIS — N182 Chronic kidney disease, stage 2 (mild): Secondary | ICD-10-CM

## 2016-12-18 DIAGNOSIS — I10 Essential (primary) hypertension: Secondary | ICD-10-CM

## 2016-12-18 DIAGNOSIS — M4850XD Collapsed vertebra, not elsewhere classified, site unspecified, subsequent encounter for fracture with routine healing: Secondary | ICD-10-CM

## 2016-12-18 DIAGNOSIS — I428 Other cardiomyopathies: Secondary | ICD-10-CM

## 2016-12-18 DIAGNOSIS — IMO0001 Reserved for inherently not codable concepts without codable children: Secondary | ICD-10-CM

## 2016-12-18 LAB — BASIC METABOLIC PANEL
ANION GAP: 8 meq/L (ref 3–11)
BUN: 7.5 mg/dL (ref 7.0–26.0)
CHLORIDE: 106 meq/L (ref 98–109)
CO2: 25 mEq/L (ref 22–29)
CREATININE: 1.2 mg/dL (ref 0.7–1.3)
Calcium: 9.6 mg/dL (ref 8.4–10.4)
EGFR: 62 mL/min/{1.73_m2} — ABNORMAL LOW (ref >90)
Glucose: 97 mg/dl (ref 70–140)
POTASSIUM: 3.4 meq/L — AB (ref 3.5–5.1)
SODIUM: 139 meq/L (ref 136–145)

## 2016-12-18 LAB — CBC WITH DIFFERENTIAL/PLATELET
BASO%: 1.4 % (ref 0.0–2.0)
BASOS ABS: 0.1 10*3/uL (ref 0.0–0.1)
EOS%: 3.5 % (ref 0.0–7.0)
Eosinophils Absolute: 0.1 10*3/uL (ref 0.0–0.5)
HEMATOCRIT: 46.5 % (ref 38.4–49.9)
HGB: 15.3 g/dL (ref 13.0–17.1)
LYMPH#: 0.7 10*3/uL — AB (ref 0.9–3.3)
LYMPH%: 16.5 % (ref 14.0–49.0)
MCH: 34.1 pg — AB (ref 27.2–33.4)
MCHC: 33 g/dL (ref 32.0–36.0)
MCV: 103.4 fL — ABNORMAL HIGH (ref 79.3–98.0)
MONO#: 0.8 10*3/uL (ref 0.1–0.9)
MONO%: 18.5 % — AB (ref 0.0–14.0)
NEUT#: 2.5 10*3/uL (ref 1.5–6.5)
NEUT%: 60.1 % (ref 39.0–75.0)
Platelets: 176 10*3/uL (ref 140–400)
RBC: 4.49 10*6/uL (ref 4.20–5.82)
RDW: 14.6 % (ref 11.0–14.6)
WBC: 4.1 10*3/uL (ref 4.0–10.3)

## 2016-12-18 MED ORDER — ZOLEDRONIC ACID 4 MG/100ML IV SOLN
4.0000 mg | Freq: Once | INTRAVENOUS | Status: AC
Start: 2016-12-18 — End: 2016-12-18
  Administered 2016-12-18: 4 mg via INTRAVENOUS
  Filled 2016-12-18: qty 100

## 2016-12-18 MED ORDER — GABAPENTIN 100 MG PO CAPS: 100.0000 mg | ORAL_CAPSULE | Freq: Three times a day (TID) | ORAL | 1 refills | Status: DC

## 2016-12-18 MED ORDER — SODIUM CHLORIDE 0.9 % IV SOLN
Freq: Once | INTRAVENOUS | Status: AC
Start: 2016-12-18 — End: 2016-12-18
  Administered 2016-12-18: 11:00:00 via INTRAVENOUS

## 2016-12-18 MED ORDER — POTASSIUM CHLORIDE ER 10 MEQ PO CPCR: 30.0000 meq | ORAL_CAPSULE | Freq: Every day | ORAL | 2 refills | Status: DC

## 2016-12-18 NOTE — Progress Notes (Signed)
Pt did not have CMET drawn in lab today. VO received from Dr. Burr Medico to obtain stat BMET prior to starting Zometa today.  Lab to come and collect sample.

## 2016-12-18 NOTE — Telephone Encounter (Signed)
Appointments scheduled per 12/22 LOS. Patient given AVS report and calendars with future scheduled appointments. °

## 2016-12-18 NOTE — Patient Instructions (Signed)

## 2017-01-02 ENCOUNTER — Other Ambulatory Visit: Payer: Self-pay | Admitting: Hematology

## 2017-01-02 DIAGNOSIS — C9001 Multiple myeloma in remission: Secondary | ICD-10-CM

## 2017-01-05 ENCOUNTER — Other Ambulatory Visit: Payer: Self-pay | Admitting: *Deleted

## 2017-01-05 DIAGNOSIS — C9001 Multiple myeloma in remission: Secondary | ICD-10-CM

## 2017-01-05 MED ORDER — LENALIDOMIDE 5 MG PO CAPS: ORAL_CAPSULE | ORAL | 0 refills | Status: DC

## 2017-01-06 ENCOUNTER — Telehealth: Payer: Self-pay | Admitting: *Deleted

## 2017-01-06 NOTE — Telephone Encounter (Signed)
Pt states he has taken his last Revlimid and will need a refill.

## 2017-01-15 ENCOUNTER — Other Ambulatory Visit: Payer: Self-pay | Admitting: Nurse Practitioner

## 2017-02-04 ENCOUNTER — Other Ambulatory Visit: Payer: Self-pay | Admitting: *Deleted

## 2017-02-04 DIAGNOSIS — C9001 Multiple myeloma in remission: Secondary | ICD-10-CM

## 2017-02-04 MED ORDER — LENALIDOMIDE 5 MG PO CAPS: ORAL_CAPSULE | ORAL | 0 refills | Status: DC

## 2017-02-24 DIAGNOSIS — Z9484 Stem cells transplant status: Secondary | ICD-10-CM | POA: Diagnosis not present

## 2017-02-24 DIAGNOSIS — C9000 Multiple myeloma not having achieved remission: Secondary | ICD-10-CM | POA: Diagnosis not present

## 2017-03-05 ENCOUNTER — Telehealth: Payer: Self-pay | Admitting: *Deleted

## 2017-03-05 NOTE — Telephone Encounter (Signed)
OK to refill. Thanks.   Truitt Merle MD

## 2017-03-05 NOTE — Telephone Encounter (Signed)
Patient called stating that he needs a revlimid refill.

## 2017-03-06 ENCOUNTER — Other Ambulatory Visit: Payer: Self-pay | Admitting: Hematology

## 2017-03-06 DIAGNOSIS — C9001 Multiple myeloma in remission: Secondary | ICD-10-CM

## 2017-03-09 ENCOUNTER — Other Ambulatory Visit: Payer: Self-pay | Admitting: *Deleted

## 2017-03-09 DIAGNOSIS — C9001 Multiple myeloma in remission: Secondary | ICD-10-CM

## 2017-03-09 MED ORDER — LENALIDOMIDE 5 MG PO CAPS: ORAL_CAPSULE | ORAL | 0 refills | Status: DC

## 2017-03-16 NOTE — Progress Notes (Signed)
Eric Dudley  [phone] [phone]  Clinic Follow Up Note   Patient Care Team: Eric Seashore, MD as PCP - General (Internal Medicine) Eric Casino, MD as Consulting Physician (Cardiology) Eric Merle, MD as Consulting Physician (Hematology) 03/18/2017   CHIEF COMPLAINTS: Follow-up multiple myeloma    Multiple myeloma in remission ([name])   01/31/2015 Tumor Marker    SPEP showed M-SPIKE 1.7g/dl, Cr 1.5, no anemia, hypercalcemia      03/19/2015 Imaging    Bone survey showed no discrete lytic lesion, but there is osteopenia and calvarial heterogeneity.      03/27/2015 Initial Diagnosis    Multiple myeloma, stage I      03/27/2015 Bone Marrow Biopsy    Bone Marrow, Aspirate,Biopsy: - HYPERCELLULAR BONE MARROW FOR AGE WITH PLASMA CELL NEOPLASM 47% - SEE COMMENT. PERIPHERAL BLOOD: - NO SIGNIFICANT MORPHOLOGIC ABNORMALITIES. Diagnosis Note The bone marrow shows increased number of       03/27/2015 Miscellaneous    bone marrow Cytogenetics: normal. FISH panel showed the presence of +4 and +11, this is consider standard risk of MM       04/16/2015 - 04/23/2015 Hospital Admission    He was admitted for worsening back pain, was found to have a T12 pathological fracture. He underwent kyphoplasty on 04/19/2015.      04/17/2015 Imaging    MR Thoracic and Lumbar Spine w/o Contrast 04/17/2015 IMPRESSION: MR THORACIC SPINE IMPRESSION: Pathologic compression fracture at T8 in this patient with widespread multiple myeloma. Slight retropulsion of abnormal bone without conus compression. MR LUMBAR SPINE IMPRESSION: Focal myeloma deposits at L3 and T9 without significant compression deformity. Mild stenosis at L4-5 related to ordinary spondylosis.      04/19/2015 - 08/02/2015 Chemotherapy    RVD with weekly Velcade 1.3 mg/m, dexamethasone 40 mg, and Revlimid 10 mg daily on day 1-21, every 28 days, cycle 1 Revlimid was interupted by hospitalization, cycle  3 Revlimid dose increased to 25 mg daily due to his improved renal function. total 4 cyc      04/26/2015 - 05/01/2015 Hospital Admission    He was admitted to [facility] for syncope episode during his radiation simulation. EKG showed ST elevation, troponin was positive, he underwent cardio catheterization which showed mild stenosis. No intervention was needed. echo showed EF 35-40%      04/29/2015 - 05/10/2015 Radiation Therapy    palliative RT to T12, 20Gy in 10 fractions      08/19/2015 Bone Marrow Transplant    He underwent autologous stem cell transplant at [facility].       09/30/2015 Imaging    CT anginal chest 09/30/2015 IMPRESSION: 1. No pulmonary embolism. 2. No thoracic aortic aneurysm or dissection. 3. Heart size is normal. No pericardial effusion. 4. Mild scarring/atelectasis at each lung base, probably chronic. Lungs otherwise clear. No evidence of pneumonia. No pleural effusion. 5. Evidence of diffuse multiple myeloma involvement throughout the thoracolumbar spine and multiple ribs bilaterally, with areas of present clinical relevance described below. 6. Compression fracture deformity of the T12 vertebral body, known pathologic fracture, status post treatment with vertebroplasty on 04/19/2015. 7. Milder compression deformity of the overlying T11 vertebral body, approximately 40% compressed anteriorly, also presumably pathologic. This vertebral body appeared essentially normal on fluoroscopic images from the earlier aforementioned vertebroplasty of 04/19/2015. 8. Slight irregularity of the right lateral sixth rib, with subtle cortical sclerosis suggesting nondisplaced healing fracture. This may be a relatively acute fracture and is probably pathologic in nature  related to underlying multiple myeloma. 9. Similar irregularity of the left lateral fifth rib, suggesting minimally displaced healing fracture. This may also be a relatively acute fracture and is  likely pathologic in nature related to underlying multiple myeloma. These acute or subacute rib fractures are the most likely source of patient's bilateral chest pain. Additional incidental findings in the upper abdomen: Fatty infiltration of liver, cholelithiasis without evidence of acute cholecystitis, fairly large amount of stool within the nondistended colon of the upper abdomen (constipation?).      11/27/2015 Bone Marrow Biopsy    hypocellular marrow (10%), no increased plasma cells       12/29/2015 -  Chemotherapy    maintenance Revlimid 33m daily, 3 weeks on, one week off         HISTORY OF PRESENTING ILLNESS (03/15/2015):  Eric Gitelman89y.o. 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  He has been having low back pain for 3-4 months. He had food posinin 4 month ago, and had frequent diarrhea. He started noticed low back pain since then. The pain is not radiating to leg, he also has intermittent right rib pain, worse with cough and sneezing. He remains to be physically active, able to do all the activities, such as gardening work housework without much limitation, but he doesn't sings slowly because of back pain. He takes Tylenol as needed, does not like the necrotic pain medication. No night sweats, no fever or chillss, no weight loss.   He was seen by his primary care physician Dr. RMerrilee Seashore MD and had lab test (see below). He also had bone density scan 2/25 which showed osteoporosis, has not been treated yet.  Current therapy:  Maintenance Revlimid 5 mg daily, 3 weeks on, one-week off, started on 12/29/2015  INTERIM HISTORY: Mr. TBrowereturns for follow-up. Patient reports to feeling fine. Things have been going god since our last visit 3 months ago. He says he does what he wants to do and when he experiences pain he stops until it has gone  away. Pain still in back when standing that lasts for about 10 seconds. Patient says he gets sick about every 2 weeks and lasts a couple hours, regardless of what he eats or what medication (on/off Revlimid). Denies cough, bowel issue and stomach issues.   MEDICAL HISTORY:  Past Medical History:  Diagnosis Date  . Cholelithiases   . Chronic kidney disease    MILD,CHRONIC  . Dyslipidemia   . Hyperlipidemia   . Hypertension   . 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 APPENDECTOMY  1962  . CARDIAC CATHETERIZATION N/A 04/30/2015   Procedure: Left Heart Cath and Coronary Angiography;  Surgeon: Peter M JMartinique MD;  [facility];  Service: Cardiovascular;  Laterality: N/A;  . CARDIOLITE MYOCARDIAL PERFUSION STUDY  04/16/03   NEGITIVE BRUCE PROTOCAL EXERCISE STRESS TEST. EF 70%. NO ISCHEMIA.  .Marland KitchenKNEE SURGERY  2012   right  . SHOULDER SURGERY  1999   left    SOCIAL HISTORY: History   Social History  . Marital Status: Married    Spouse Name: N/A  . Number of Children: 1   . Years of Education: N/A   Occupational History  . A retired mTherapist, occupational   Social History Main Topics  .  Smoking status: Former Smoker    Types: Cigars    Quit date: 12/29/2003  . Smokeless tobacco: Former Systems developer    Types: Chew    Quit date: 12/28/1994  . Alcohol Use: Yes     Comment: occasional beer  . Drug Use: No  . Sexual Activity: Not on file   Other Topics Concern  . Not on file   Social History Narrative  . No narrative on file    FAMILY HISTORY: Family History  Problem Relation Age of Onset  . Cancer Mother 43    cervical cancer   . Cancer Father     unknown cancer   . Hypertension Sister   . Cancer Sister     melanoma     ALLERGIES:  is allergic to hydrochlorothiazide; asa [aspirin]; calcium carbonate; oxycodone; amoxicillin; and indapamide.  MEDICATIONS:  Current Outpatient Prescriptions    Medication Sig Dispense Refill  . aspirin 81 MG chewable tablet Chew 1 tablet (81 mg total) by mouth daily.    [name] atorvastatin (LIPITOR) 40 MG tablet Take 40 mg by mouth daily.    . calcium carbonate (OS-CAL) 600 MG TABS tablet Take 1,200 mg by mouth daily with breakfast.    . clopidogrel (PLAVIX) 75 MG tablet     . diphenhydrAMINE (BENADRYL) 25 mg capsule Take 25 mg by mouth at bedtime as needed.    [name] guaiFENesin (MUCINEX) 600 MG 12 hr tablet Take 1,200 mg by mouth.    . lenalidomide (REVLIMID) 5 MG capsule Take Revlimid  5 mg by mouth daily  For  21 days ,  Rest  7 days. 21 capsule 0  . lidocaine (LIDODERM) 5 % Place 1 patch onto the skin daily. Remove & Discard patch within 12 hours or as directed by MD 30 patch 1  . loperamide (IMODIUM) 2 MG capsule Take 2 mg by mouth 4 (four) times daily as needed. Reported on 04/21/2016    . Multiple Vitamins-Minerals (CENTRUM SILVER ULTRA MENS PO) Take 1 tablet by mouth daily.    . nitroGLYCERIN (NITROSTAT) 0.4 MG SL tablet Place 0.4 mg under the tongue every 5 (five) minutes x 3 doses as needed. Reported on 05/19/2016    . ondansetron (ZOFRAN) 4 MG tablet TAKE 1 TABLET FOUR TIMES A DAY AS NEEDED FOR NAUSEA OR VOMITING 60 tablet 2  . pantoprazole (PROTONIX) 40 MG tablet     . polyethylene glycol (MIRALAX / GLYCOLAX) packet Take 17 g by mouth daily as needed.     . potassium chloride (MICRO-K) 10 MEQ CR capsule Take 20 mEq by mouth daily.    . potassium chloride (MICRO-K) 10 MEQ CR capsule TAKE 3 CAPSULES DAILY 90 capsule 2  . prochlorperazine (COMPAZINE) 10 MG tablet     . ranitidine (ZANTAC) 150 MG tablet Take 2 tablets (300 mg total) by mouth at bedtime. 60 tablet 1  . spironolactone (ALDACTONE) 25 MG tablet     . Zoledronic Acid (ZOMETA) 4 MG/100ML IVPB Inject 4 mg into the vein. EVERY 90 DAYS    . gabapentin (NEURONTIN) 100 MG capsule Take 1 capsule (100 mg total) by mouth 3 (three) times daily. (Patient not taking: Reported on 03/18/2017) 90 capsule 1    No current facility-administered medications for this visit.     REVIEW OF SYSTEMS:   Constitutional: Denies fevers, chills or abnormal night sweats Eyes: Denies blurriness of vision, double vision or watery eyes Ears, nose, mouth, throat, and face: Denies mucositis or sore throat Respiratory: Denies  cough, dyspnea or wheezes Cardiovascular: Denies palpitation, chest discomfort or lower extremity swelling Gastrointestinal:  Denies nausea, heartburn or change in bowel habits Skin: Denies abnormal skin rashes Lymphatics: Denies new lymphadenopathy or easy bruising Neurological:Denies numbness, tingling or new weaknesses Behavioral/Psych: Mood is stable, no new changes  Musculoskeletal: Positive for back pain and intermittent rib pain All other systems were reviewed with the patient and are negative.  PHYSICAL EXAMINATION:  ECOG PERFORMANCE STATUS: 1 BP 119/70 (BP Location: Right Arm, Patient Position: Sitting)   Pulse (!) 57   Temp 98 F (36.7 C) (Oral)   Resp 17   Ht _0  (1.727 m)   Wt 149 lb 11.2 oz (67.9 kg)   SpO2 100%   BMI 22.76 kg/m   GENERAL: He comes in today with a wheelchair, alert and oriented, no distress, chronically ill-appearing SKIN: skin color, texture, turgor are normal, no rashes or significant lesions except to some healing rash on his hands EYES: normal, conjunctiva are pink and non-injected, sclera clear OROPHARYNX:no exudate, no erythema and lips, buccal mucosa, and tongue normal  NECK: supple, thyroid normal size, non-tender, without nodularity LYMPH:  no palpable lymphadenopathy in the cervical, axillary or inguinal LUNGS: clear to auscultation and percussion with normal breathing effort HEART: regular rate & rhythm and no murmurs and no lower extremity edema ABDOMEN:abdomen soft, non-tender and normal bowel sounds Musculoskeletal:no cyanosis of digits and no clubbing, mild tenderness on bilateral lateral chest wall.  PSYCH: alert & oriented x 3  with fluent speech NEURO: no focal motor/sensory deficits CHEST WALL: slightly enlarged left breast, mild tenderness, no palpable mass. He states his left breast is slightly bigger than right for all his adult life.  LABORATORY DATA:   CBC Latest Ref Rng & Units 03/18/2017 12/18/2016 10/30/2016  WBC 4.0 - 10.3 10e3/uL 3.0(L) 4.1 4.1  Hemoglobin 13.0 - 17.1 g/dL 14.7 15.3 15.8  Hematocrit 38.4 - 49.9 % 43.6 46.5 48.4  Platelets 140 - 400 10e3/uL 181 176 199   CMP Latest Ref Rng & Units 03/18/2017 12/18/2016 10/30/2016  Glucose 70 - 140 mg/dl 102 97 104  BUN 7.0 - 26.0 mg/dL 6.8(L) 7.5 6.1(L)  Creatinine 0.7 - 1.3 mg/dL 1.0 1.2 1.1  Sodium 136 - 145 mEq/L 137 139 138  Potassium 3.5 - 5.1 mEq/L 3.4(L) 3.4(L) 3.2(L)  Chloride 101 - 111 mmol/L - - -  CO2 22 - 29 mEq/L 19(L) 25 19(L)  Calcium 8.4 - 10.4 mg/dL 10.0 9.6 9.4  Total Protein 6.4 - 8.3 g/dL 6.4 - 7.1  Total Bilirubin 0.20 - 1.20 mg/dL 0.57 - 0.73  Alkaline Phos 40 - 150 U/L 115 - 144  AST 5 - 34 U/L 30 - 26  ALT 0 - 55 U/L 38 - 35   MM lab: SPEP M-protein (g/dl) 03/15/2015: 1.6 05/24/2015: 0.5 06/21/2015: 0.4 07/18/2015 (at [facility]): 0.33 11/27/2015: not det 01/21/2016: not det  03/24/2016: not det 05/19/2016: not det  08/07/2016: not det  10/30/2017: not det  Serum IgG (mg/dl) 03/15/2015: 2000 05/24/2015: 579 06/21/2015: 673 07/18/2015 ([facility]): 509 11/27/2015: 396 01/21/2016: 487 03/24/2016: 563 05/19/2016: 600 08/07/2016: 530 10/30/2016: 589  Serum kappa/lamda/ratio (mg/dl)  03/15/2015: 690, 0.13, 5308 05/24/2015: 146, 1.27, 115.0 06/21/2015: 86, 0.74, 116.2 01/21/2016: 1.09, 1.21, 0.9 03/24/2016: 1.72, 1.49, 1.16 05/19/2016: 1.91, 1.50, 1.27 08/07/2016: 1.36, 1.41, 0.96  10/30/2016: 1.6, 1.32, 1.21   24 h urine kappa light chain (mg) 03/18/2015: 2434 05/28/2015: 582 06/24/2015: 322 04/27/2016: 499    PATHOLOGY REPORT Bone Marrow, Aspirate,Biopsy, and Clot,  right iliac 03/27/2015 - HYPERCELLULAR BONE MARROW FOR AGE WITH  PLASMA CELL NEOPLASM. - SEE COMMENT. PERIPHERAL BLOOD: - NO SIGNIFICANT MORPHOLOGIC ABNORMALITIES. Diagnosis Note The bone marrow shows increased number of atypical plasma cells representing 47% of all cells in the aspirate associated with prominent interstitial infiltrates and variably sized aggregates in the clot and biopsy sections. Immunohistochemical stains show that the plasma cells are kappa light chain restricted consistent with plasma cell neoplasm. The background shows trilineage  Cytogenetics: normal FISH panel: +4 and +11  Bone Marrow, Aspirate,Biopsy, and Clot, 11/27/2015 at [facility]: [phone] RECEIVED: 11/27/2015 ORDERING PHYSICIAN: CESAR Melburn Hake , MD PATIENT NAME: Eric Dudley BONE MARROW REPORT   Bone Marrow (BM) and Peripheral Blood (PB) FINAL PATHOLOGIC DIAGNOSIS  BONE MARROW: Hypocellular marrow (10%) with no increase in plasma cells. See comment.  PERIPHERAL BLOOD: Absolute lymphopenia. See CBC data.  COMMENT: The touch prep is cellular and adequate for evaluation. Rare plasma cells are present (<1%). Blasts are not increased, Myeloid and erythroid cells are present in normal proportions with intact maturation and no overt dysplastic changes. Megakaryocytes are absent. The aspirate smears are aspicular and hemodilute.  Bone marrow and clot sections are variably hypocellular (0-20% range, 10% overall). Scattered interstitial plasma cells are present and comprise <5% of marrow cellularity by CD138 immunohistochemical analysis. Blasts are not increased. Myeloid and erythroid cells are present in normal proportions and have no overt morphologic abnormalities. Megakaryocytes are morphologically within normal limits.  Examination of the peripheral blood reveals absolute lymphopenia. There are no circulating plasma cells or evidence of rouleaux formation.   RADIOGRAPHIC STUDIES: CT anginal chest 09/30/2015 IMPRESSION: 1.  No pulmonary embolism. 2. No thoracic aortic aneurysm or dissection. 3. Heart size is normal. No pericardial effusion. 4. Mild scarring/atelectasis at each lung base, probably chronic. Lungs otherwise clear. No evidence of pneumonia. No pleural effusion. 5. Evidence of diffuse multiple myeloma involvement throughout the thoracolumbar spine and multiple ribs bilaterally, with areas of present clinical relevance described below. 6. Compression fracture deformity of the T12 vertebral body, known pathologic fracture, status post treatment with vertebroplasty on 04/19/2015. 7. Milder compression deformity of the overlying T11 vertebral body, approximately 40% compressed anteriorly, also presumably pathologic. This vertebral body appeared essentially normal on fluoroscopic images from the earlier aforementioned vertebroplasty of 04/19/2015. 8. Slight irregularity of the right lateral sixth rib, with subtle cortical sclerosis suggesting nondisplaced healing fracture. This may be a relatively acute fracture and is probably pathologic in nature related to underlying multiple myeloma. 9. Similar irregularity of the left lateral fifth rib, suggesting minimally displaced healing fracture. This may also be a relatively acute fracture and is likely pathologic in nature related to underlying multiple myeloma. These acute or subacute rib fractures are the most likely source of patient's bilateral chest pain. Additional incidental findings in the upper abdomen: Fatty infiltration of liver, cholelithiasis without evidence of acute cholecystitis, fairly large amount of stool within the nondistended colon of the upper abdomen (constipation? ).   ASSESSMENT & PLAN:  66 y.o. Caucasian male with past medical history of hypertension, tonsil cancer status post surgery and radiation 10 years ago, now presented with 3-4 months low back pain and intermittent rib pain. His blood test showed M protein  1.7g/dl.  1. IgG multiple myeloma, stage I, standard risk by cytogenetics (+4 and +11), in remission  -I previously discussed his initial bone marrow biopsy results with him. He has significant amount of plasma cells in the bone marrow 47%.  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 Based on the new notable myeloma diagnostic criteria, he meets the criteria of multiple myeloma. He has normal albumin and miildly elevated beta 2 microglobulin, this is stage I. -His cytogenetics and Fish panel revealed standard risk. -His thoracic and lumbar spine MRI showed pathologic compression fracture at T12, focal myeloma deposits at L3 and T9, diffuse marrow signal abnormality consistent with myeloma -I previously recommended induction chemotherapy with VRD regimen, weekly Velcade and dexamethasone, first 2 cycles Revlimid dose reduced to 10 mg daily 3 week on, one-week off, based on his renal function, and it which was increased to full dose from cycle 3. He received a total of 4 cycles of treatment, achieved a very good partial response. Repeat bone marrow on 07/18/2015 showed 5% plasma cells, fish and cytogenetics was normal. -His post transplant evaluation previously showed complete remission (<1% plasma cell in marrow, undetectable M protein), and he has recovered very well. -He previously started maintenance Revlimid 5 mg daily, 3 weeks on, 1 week off, tolerating well, we'll continue. -lab results previously reviewed with him, his M protein remains to be negative, we'll continue Revlimid, he will start next cycle next week  -He is doing well, will monitoring lab every 2 months  - Discussed possibility of Revlimid may iIncreasing risk of secondary cancer  occurring. Colonoscopy in 2 years, he does PSA screening at [facility]  - Scheduled for zometa today, continue every 3 months   2.  Transaminitis - he has had elevated ALT and AST, normal bilirubin possible related to medications -Resolved lately. We'll continue monitoring  3. T12 pathologic compression fracture, and diffuse myeloma involvement in bones -Status post kyphoplasty and palliative radiation  -He previously received palliative Radiation, pain much improved overall. He does complain more mid back and right-sided chest pain daily, likely related to his physical activities. -continue zometa infusion every 3 months, he has been seeing his dentist regularly.   4. nonobstructive CAD, STEMI, Non ischemic CM, Takatsubo, with EF of 35-40% -Patient was initially found to have troponin elevation and anterior apical ST elevation MI and resultant cardiomyopathy on 04/26/15 -He underwent a cardiac catheterization on 5/3 which showed mild diffuse disease, no stents were placed. He is on aspirin -follow up with Dr. Debara Pickett  -Repeat echo before transplant showed EF 55-60% in 11/2015   5. Peripheral neuropathy, grade 1 -Likely secondary to his bone marrow transplant chemotherapy -Mild and Stable overall, we'll continue observation  6.  Hypokalemia - potassium was previously 3.3 , he is on potassium 1 tablet daily, previously  recommend him to increase to 2 tab daily for 5 days, then 1 tab daily -I previously encouraged him to eat potassium enriched food - Potassium levels 3.4 today. I have encouraged him to eat more fruit -we'll continue monitoring  7. Advanced directives -his advanced directives is in EPIC -he is full code.   Plan - lab f/u in 2 months - Zometa infusion today and every 3 months  All questions were answered. The patient knows to call the clinic with any problems, questions or concerns.  I spent 20 minutes counseling the patient face to face. The total time spent in the  appointment was 30 minutes and more than 50% was on counseling.  Eric Merle, MD 03/18/2017     This document serves as a record of services personally performed by Eric Merle, MD. It was created on her behalf by Brandt Loosen, a trained medical scribe. The creation of this record is based on the scribe's personal observations and the provider's statements to them. This document has been checked and approved by the attending provider.

## 2017-03-18 ENCOUNTER — Other Ambulatory Visit (HOSPITAL_BASED_OUTPATIENT_CLINIC_OR_DEPARTMENT_OTHER): Payer: Medicare Other

## 2017-03-18 ENCOUNTER — Ambulatory Visit (HOSPITAL_BASED_OUTPATIENT_CLINIC_OR_DEPARTMENT_OTHER): Payer: Medicare Other

## 2017-03-18 ENCOUNTER — Telehealth: Payer: Self-pay | Admitting: Hematology

## 2017-03-18 ENCOUNTER — Ambulatory Visit (HOSPITAL_BASED_OUTPATIENT_CLINIC_OR_DEPARTMENT_OTHER): Payer: Medicare Other | Admitting: Hematology

## 2017-03-18 VITALS — BP 119/70 | HR 57 | Temp 98.0°F | Resp 17 | Ht 68.0 in | Wt 149.7 lb

## 2017-03-18 DIAGNOSIS — M545 Low back pain: Secondary | ICD-10-CM

## 2017-03-18 DIAGNOSIS — G620 Drug-induced polyneuropathy: Secondary | ICD-10-CM | POA: Diagnosis not present

## 2017-03-18 DIAGNOSIS — E876 Hypokalemia: Secondary | ICD-10-CM

## 2017-03-18 DIAGNOSIS — I428 Other cardiomyopathies: Secondary | ICD-10-CM

## 2017-03-18 DIAGNOSIS — C9001 Multiple myeloma in remission: Secondary | ICD-10-CM

## 2017-03-18 DIAGNOSIS — N182 Chronic kidney disease, stage 2 (mild): Secondary | ICD-10-CM

## 2017-03-18 DIAGNOSIS — I10 Essential (primary) hypertension: Secondary | ICD-10-CM

## 2017-03-18 LAB — COMPREHENSIVE METABOLIC PANEL
ALK PHOS: 115 U/L (ref 40–150)
ALT: 38 U/L (ref 0–55)
ANION GAP: 9 meq/L (ref 3–11)
AST: 30 U/L (ref 5–34)
Albumin: 3.6 g/dL (ref 3.5–5.0)
BILIRUBIN TOTAL: 0.57 mg/dL (ref 0.20–1.20)
BUN: 6.8 mg/dL — ABNORMAL LOW (ref 7.0–26.0)
CHLORIDE: 109 meq/L (ref 98–109)
CO2: 19 meq/L — AB (ref 22–29)
Calcium: 10 mg/dL (ref 8.4–10.4)
Creatinine: 1 mg/dL (ref 0.7–1.3)
EGFR: 77 mL/min/{1.73_m2} — AB (ref >90)
Glucose: 102 mg/dl (ref 70–140)
POTASSIUM: 3.4 meq/L — AB (ref 3.5–5.1)
SODIUM: 137 meq/L (ref 136–145)
Total Protein: 6.4 g/dL (ref 6.4–8.3)

## 2017-03-18 LAB — CBC WITH DIFFERENTIAL/PLATELET
BASO%: 1.1 % (ref 0.0–2.0)
Basophils Absolute: 0 10*3/uL (ref 0.0–0.1)
EOS%: 4.6 % (ref 0.0–7.0)
Eosinophils Absolute: 0.1 10*3/uL (ref 0.0–0.5)
HEMATOCRIT: 43.6 % (ref 38.4–49.9)
HGB: 14.7 g/dL (ref 13.0–17.1)
LYMPH#: 0.6 10*3/uL — AB (ref 0.9–3.3)
LYMPH%: 21.2 % (ref 14.0–49.0)
MCH: 35.8 pg — ABNORMAL HIGH (ref 27.2–33.4)
MCHC: 33.6 g/dL (ref 32.0–36.0)
MCV: 106.5 fL — ABNORMAL HIGH (ref 79.3–98.0)
MONO#: 0.7 10*3/uL (ref 0.1–0.9)
MONO%: 22.1 % — ABNORMAL HIGH (ref 0.0–14.0)
NEUT#: 1.5 10*3/uL (ref 1.5–6.5)
NEUT%: 51 % (ref 39.0–75.0)
Platelets: 181 10*3/uL (ref 140–400)
RBC: 4.1 10*6/uL — ABNORMAL LOW (ref 4.20–5.82)
RDW: 16.1 % — AB (ref 11.0–14.6)
WBC: 3 10*3/uL — ABNORMAL LOW (ref 4.0–10.3)

## 2017-03-18 MED ORDER — ZOLEDRONIC ACID 4 MG/100ML IV SOLN
4.0000 mg | Freq: Once | INTRAVENOUS | Status: AC
  Administered 2017-03-18: 4 mg via INTRAVENOUS
  Filled 2017-03-18: qty 100

## 2017-03-18 NOTE — Patient Instructions (Signed)

## 2017-03-18 NOTE — Telephone Encounter (Signed)
Gave patient avs report and appointments for May  °

## 2017-03-19 LAB — KAPPA/LAMBDA LIGHT CHAINS
IG KAPPA FREE LIGHT CHAIN: 16.7 mg/L (ref 3.3–19.4)
Ig Lambda Free Light Chain: 15.5 mg/L (ref 5.7–26.3)
Kappa/Lambda FluidC Ratio: 1.08 (ref 0.26–1.65)

## 2017-03-21 ENCOUNTER — Encounter: Payer: Self-pay | Admitting: Hematology

## 2017-03-22 LAB — MULTIPLE MYELOMA PANEL, SERUM
ALBUMIN SERPL ELPH-MCNC: 3.5 g/dL (ref 2.9–4.4)
Albumin/Glob SerPl: 1.4 (ref 0.7–1.7)
Alpha 1: 0.2 g/dL (ref 0.0–0.4)
Alpha2 Glob SerPl Elph-Mcnc: 0.9 g/dL (ref 0.4–1.0)
B-Globulin SerPl Elph-Mcnc: 1 g/dL (ref 0.7–1.3)
Gamma Glob SerPl Elph-Mcnc: 0.5 g/dL (ref 0.4–1.8)
Globulin, Total: 2.6 g/dL (ref 2.2–3.9)
IGM (IMMUNOGLOBIN M), SRM: 10 mg/dL — AB (ref 20–172)
IgA, Qn, Serum: 12 mg/dL — ABNORMAL LOW (ref 61–437)
IgG, Qn, Serum: 584 mg/dL — ABNORMAL LOW (ref 700–1600)
Total Protein: 6.1 g/dL (ref 6.0–8.5)

## 2017-04-02 ENCOUNTER — Other Ambulatory Visit: Payer: Self-pay | Admitting: *Deleted

## 2017-04-02 DIAGNOSIS — C9001 Multiple myeloma in remission: Secondary | ICD-10-CM

## 2017-04-02 MED ORDER — LENALIDOMIDE 5 MG PO CAPS: ORAL_CAPSULE | ORAL | 0 refills | Status: DC

## 2017-04-03 ENCOUNTER — Other Ambulatory Visit: Payer: Self-pay | Admitting: Hematology

## 2017-04-03 DIAGNOSIS — C9001 Multiple myeloma in remission: Secondary | ICD-10-CM

## 2017-04-15 ENCOUNTER — Other Ambulatory Visit: Payer: Self-pay | Admitting: Hematology

## 2017-04-15 DIAGNOSIS — C9001 Multiple myeloma in remission: Secondary | ICD-10-CM

## 2017-04-20 DIAGNOSIS — N400 Enlarged prostate without lower urinary tract symptoms: Secondary | ICD-10-CM | POA: Diagnosis not present

## 2017-04-22 ENCOUNTER — Telehealth: Payer: Self-pay | Admitting: *Deleted

## 2017-04-22 ENCOUNTER — Encounter (HOSPITAL_COMMUNITY): Payer: Self-pay | Admitting: Emergency Medicine

## 2017-04-22 ENCOUNTER — Inpatient Hospital Stay (HOSPITAL_COMMUNITY)
Admission: EM | Admit: 2017-04-22 | Discharge: 2017-04-26 | DRG: 866 | Disposition: A | Discharge status: Home / Self Care | Payer: Medicare Other | Attending: Internal Medicine | Admitting: Internal Medicine

## 2017-04-22 DIAGNOSIS — Z87891 Personal history of nicotine dependence: Secondary | ICD-10-CM | POA: Diagnosis not present

## 2017-04-22 DIAGNOSIS — I428 Other cardiomyopathies: Secondary | ICD-10-CM

## 2017-04-22 DIAGNOSIS — Z886 Allergy status to analgesic agent status: Secondary | ICD-10-CM

## 2017-04-22 DIAGNOSIS — Z9484 Stem cells transplant status: Secondary | ICD-10-CM | POA: Diagnosis not present

## 2017-04-22 DIAGNOSIS — Z885 Allergy status to narcotic agent status: Secondary | ICD-10-CM

## 2017-04-22 DIAGNOSIS — I252 Old myocardial infarction: Secondary | ICD-10-CM

## 2017-04-22 DIAGNOSIS — Z7982 Long term (current) use of aspirin: Secondary | ICD-10-CM

## 2017-04-22 DIAGNOSIS — D72819 Decreased white blood cell count, unspecified: Secondary | ICD-10-CM | POA: Diagnosis present

## 2017-04-22 DIAGNOSIS — E871 Hypo-osmolality and hyponatremia: Secondary | ICD-10-CM | POA: Diagnosis present

## 2017-04-22 DIAGNOSIS — H5712 Ocular pain, left eye: Secondary | ICD-10-CM | POA: Diagnosis present

## 2017-04-22 DIAGNOSIS — Z923 Personal history of irradiation: Secondary | ICD-10-CM

## 2017-04-22 DIAGNOSIS — B0230 Zoster ocular disease, unspecified: Secondary | ICD-10-CM

## 2017-04-22 DIAGNOSIS — Z9481 Bone marrow transplant status: Secondary | ICD-10-CM | POA: Diagnosis not present

## 2017-04-22 DIAGNOSIS — Z888 Allergy status to other drugs, medicaments and biological substances status: Secondary | ICD-10-CM

## 2017-04-22 DIAGNOSIS — Z79899 Other long term (current) drug therapy: Secondary | ICD-10-CM

## 2017-04-22 DIAGNOSIS — I429 Cardiomyopathy, unspecified: Secondary | ICD-10-CM | POA: Diagnosis present

## 2017-04-22 DIAGNOSIS — I2510 Atherosclerotic heart disease of native coronary artery without angina pectoris: Secondary | ICD-10-CM | POA: Diagnosis present

## 2017-04-22 DIAGNOSIS — N179 Acute kidney failure, unspecified: Secondary | ICD-10-CM | POA: Diagnosis present

## 2017-04-22 DIAGNOSIS — C9001 Multiple myeloma in remission: Secondary | ICD-10-CM | POA: Diagnosis present

## 2017-04-22 DIAGNOSIS — Z8249 Family history of ischemic heart disease and other diseases of the circulatory system: Secondary | ICD-10-CM | POA: Diagnosis not present

## 2017-04-22 DIAGNOSIS — I129 Hypertensive chronic kidney disease with stage 1 through stage 4 chronic kidney disease, or unspecified chronic kidney disease: Secondary | ICD-10-CM | POA: Diagnosis present

## 2017-04-22 DIAGNOSIS — H538 Other visual disturbances: Secondary | ICD-10-CM | POA: Diagnosis present

## 2017-04-22 DIAGNOSIS — E785 Hyperlipidemia, unspecified: Secondary | ICD-10-CM | POA: Diagnosis present

## 2017-04-22 DIAGNOSIS — Z808 Family history of malignant neoplasm of other organs or systems: Secondary | ICD-10-CM | POA: Diagnosis not present

## 2017-04-22 DIAGNOSIS — D6959 Other secondary thrombocytopenia: Secondary | ICD-10-CM | POA: Diagnosis present

## 2017-04-22 DIAGNOSIS — R21 Rash and other nonspecific skin eruption: Secondary | ICD-10-CM

## 2017-04-22 DIAGNOSIS — E876 Hypokalemia: Secondary | ICD-10-CM | POA: Diagnosis not present

## 2017-04-22 DIAGNOSIS — Z85818 Personal history of malignant neoplasm of other sites of lip, oral cavity, and pharynx: Secondary | ICD-10-CM

## 2017-04-22 DIAGNOSIS — N183 Chronic kidney disease, stage 3 (moderate): Secondary | ICD-10-CM | POA: Diagnosis present

## 2017-04-22 DIAGNOSIS — Z880 Allergy status to penicillin: Secondary | ICD-10-CM

## 2017-04-22 DIAGNOSIS — B027 Disseminated zoster: Secondary | ICD-10-CM | POA: Diagnosis not present

## 2017-04-22 DIAGNOSIS — B029 Zoster without complications: Secondary | ICD-10-CM | POA: Diagnosis present

## 2017-04-22 DIAGNOSIS — B019 Varicella without complication: Secondary | ICD-10-CM | POA: Diagnosis not present

## 2017-04-22 LAB — COMPREHENSIVE METABOLIC PANEL
ALBUMIN: 4.5 g/dL (ref 3.5–5.0)
ALK PHOS: 150 U/L — AB (ref 38–126)
ALT: 50 U/L (ref 17–63)
AST: 48 U/L — AB (ref 15–41)
Anion gap: 8 (ref 5–15)
BUN: 6 mg/dL (ref 6–20)
CALCIUM: 9.1 mg/dL (ref 8.9–10.3)
CO2: 23 mmol/L (ref 22–32)
CREATININE: 1.34 mg/dL — AB (ref 0.61–1.24)
Chloride: 103 mmol/L (ref 101–111)
GFR calc Af Amer: >60 mL/min (ref >60)
GFR calc non Af Amer: 53 mL/min — ABNORMAL LOW (ref >60)
GLUCOSE: 95 mg/dL (ref 65–99)
Potassium: 3.3 mmol/L — ABNORMAL LOW (ref 3.5–5.1)
SODIUM: 134 mmol/L — AB (ref 135–145)
Total Bilirubin: 0.9 mg/dL (ref 0.3–1.2)
Total Protein: 7.9 g/dL (ref 6.5–8.1)

## 2017-04-22 LAB — CBC WITH DIFFERENTIAL/PLATELET
BASOS ABS: 0 10*3/uL (ref 0.0–0.1)
BASOS PCT: 0 %
EOS ABS: 0.1 10*3/uL (ref 0.0–0.7)
Eosinophils Relative: 1 %
HCT: 49.1 % (ref 39.0–52.0)
HEMOGLOBIN: 16.8 g/dL (ref 13.0–17.0)
Lymphocytes Relative: 11 %
Lymphs Abs: 0.4 10*3/uL — ABNORMAL LOW (ref 0.7–4.0)
MCH: 35.9 pg — ABNORMAL HIGH (ref 26.0–34.0)
MCHC: 34.2 g/dL (ref 30.0–36.0)
MCV: 104.9 fL — ABNORMAL HIGH (ref 78.0–100.0)
Monocytes Absolute: 0.5 10*3/uL (ref 0.1–1.0)
Monocytes Relative: 14 %
NEUTROS ABS: 2.6 10*3/uL (ref 1.7–7.7)
NEUTROS PCT: 74 %
Platelets: 122 10*3/uL — ABNORMAL LOW (ref 150–400)
RBC: 4.68 MIL/uL (ref 4.22–5.81)
RDW: 13.5 % (ref 11.5–15.5)
WBC: 3.6 10*3/uL — AB (ref 4.0–10.5)

## 2017-04-22 MED ORDER — SODIUM CHLORIDE 0.9% FLUSH
3.0000 mL | Freq: Two times a day (BID) | INTRAVENOUS | Status: DC
  Administered 2017-04-22 – 2017-04-23 (×2): 3 mL via INTRAVENOUS

## 2017-04-22 MED ORDER — CALCIUM CARBONATE 1250 (500 CA) MG PO TABS
1250.0000 mg | ORAL_TABLET | Freq: Every day | ORAL | Status: DC
Start: 2017-04-22 — End: 2017-04-26
  Administered 2017-04-22 – 2017-04-25 (×4): 1250 mg via ORAL
  Filled 2017-04-22 (×4): qty 1

## 2017-04-22 MED ORDER — SODIUM CHLORIDE 0.9 % IV SOLN: 250.0000 mL | INTRAVENOUS | Status: DC | PRN

## 2017-04-22 MED ORDER — SODIUM CHLORIDE 0.9% FLUSH: 3.0000 mL | INTRAVENOUS | Status: DC | PRN

## 2017-04-22 MED ORDER — ENOXAPARIN SODIUM 40 MG/0.4ML ~~LOC~~ SOLN
40.0000 mg | SUBCUTANEOUS | Status: DC
  Administered 2017-04-22 – 2017-04-25 (×4): 40 mg via SUBCUTANEOUS
  Filled 2017-04-22 (×4): qty 0.4

## 2017-04-22 MED ORDER — FAMOTIDINE 20 MG PO TABS
20.0000 mg | ORAL_TABLET | Freq: Every day | ORAL | Status: DC
  Administered 2017-04-22 – 2017-04-25 (×4): 20 mg via ORAL
  Filled 2017-04-22 (×4): qty 1

## 2017-04-22 MED ORDER — DEXTROSE 5 % IV SOLN
10.0000 mg/kg | Freq: Three times a day (TID) | INTRAVENOUS | Status: DC
  Administered 2017-04-22 – 2017-04-26 (×13): 680 mg via INTRAVENOUS
  Filled 2017-04-22 (×14): qty 13.6

## 2017-04-22 MED ORDER — ASPIRIN 81 MG PO CHEW
81.0000 mg | CHEWABLE_TABLET | Freq: Every day | ORAL | Status: DC
  Administered 2017-04-22 – 2017-04-25 (×4): 81 mg via ORAL
  Filled 2017-04-22 (×4): qty 1

## 2017-04-22 MED ORDER — FLUORESCEIN SODIUM 0.6 MG OP STRP
1.0000 | ORAL_STRIP | Freq: Once | OPHTHALMIC | Status: AC
  Administered 2017-04-22: 1 via OPHTHALMIC
  Filled 2017-04-22: qty 1

## 2017-04-22 MED ORDER — TETRACAINE HCL 0.5 % OP SOLN
2.0000 [drp] | Freq: Once | OPHTHALMIC | Status: AC
  Administered 2017-04-22: 2 [drp] via OPHTHALMIC
  Filled 2017-04-22: qty 4

## 2017-04-22 NOTE — ED Notes (Signed)
Oncology at bedside

## 2017-04-22 NOTE — Progress Notes (Signed)
Patient arrived to unit at around 1845. Alert and oriented x 3. Oriented to room and environment. Denies pain.Disseminated rashes noted to L side of the face,bil.forehead,chest, abdomen,  L eyeand upper back. Will endorsed to night nurse.

## 2017-04-22 NOTE — ED Notes (Signed)
Bed: HA19 Expected date:  Expected time:  Means of arrival:  Comments: Pls move 18 here - need 18 for airborne

## 2017-04-22 NOTE — Telephone Encounter (Signed)
Message received from patient in triage to inform Dr. Burr Medico that he is currently in the [facility] ED waiting to be admitted for IV antibiotics d/t shingles.  Will forward this message to Dr. Ernestina Penna pod.

## 2017-04-22 NOTE — Progress Notes (Signed)
Pharmacy Antibiotic Note  Eric Dudley is a 66 y.o. male admitted on 04/22/2017 with hx of cancer.  Dermatologist diagnosed pt with Shingles due to rash to face and chest, sent pt to ED for IV antiviral therapy due to bone marrow suppression.  Pharmacy has been consulted for acyclovir dosing.   Plan: Acyclovir 10mg /kg (680mg ) IV q8h (Using ABW for BMI<30) Follow renal function  Height: 5\' 9"  (175.3 cm) Weight: 150 lb (68 kg) IBW/kg (Calculated) : 70.7  Temp (24hrs), Avg:98.7 F (37.1 C), Min:98.7 F (37.1 C), Max:98.7 F (37.1 C)  No results for input(s): WBC, CREATININE, LATICACIDVEN, VANCOTROUGH, VANCOPEAK, VANCORANDOM, GENTTROUGH, GENTPEAK, GENTRANDOM, TOBRATROUGH, TOBRAPEAK, TOBRARND, AMIKACINPEAK, AMIKACINTROU, AMIKACIN in the last 168 hours.  CrCl cannot be calculated (Patient's most recent lab result is older than the maximum 21 days allowed.).    Allergies  Allergen Reactions  . Hydrochlorothiazide Rash and Palpitations  . Asa [Aspirin] Nausea Only  . Calcium Carbonate Nausea And Vomiting  . Oxycodone Other (See Comments)    Patient does not want to take. Severe Hallucinations.  . Amoxicillin Itching and Rash  . Indapamide Palpitations    Lightheadedness, dizziness    Antimicrobials this admission: 4/26 acyclovir>>  Thank you for allowing pharmacy to be a part of this patient's care.  Dolly Rias RPh 04/22/2017, 3:38 PM Pager [phone]

## 2017-04-22 NOTE — Telephone Encounter (Signed)
Call placed back to patient to check on his status.  Patient states that he saw Dr. Ashby Dawes this AM and that he ordered for patient to go immediately to the ED regarding shingles.  Patient states that he will be at [facility] ED in approximately 30 minutes.  Dr. Ernestina Penna nurse notified.  Patient appreciative of call back.

## 2017-04-22 NOTE — ED Notes (Signed)
Transporter called to transport patient to the floor.

## 2017-04-22 NOTE — ED Triage Notes (Signed)
Pt with hx cancer c/o fatigue onset yesterday, headache where he struck head on door. Dermatologist diagnosed pt with Shingles due to rash to face and chest, sent pt to ED for IV antiviral therapy due to bone marrow suppression. Pt receives Zometa every 60 days, last dose in March. Red papular rash to bilateral face, bilateral chest, scattered across multiple dermatomes. 1 open lesion to right face. Pt confirms that Dermatologist diagnosed rash as Shingles.

## 2017-04-22 NOTE — ED Notes (Signed)
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 He noticed left eye swelling since last night, and skin rash around his left eye and forehaed. He went to see his PCP and dermatologist this morning.  The patient was diagnosed with shingles by his dermatologist,who sent pt to ED. He has mild shooting pain in left eye, but vision is unchanged, no headaches. The patient is otherwise doing well, no fever, chills, or other symptoms.    Objective:  Vitals:   04/22/17 1328  BP: (!) 136/91  Pulse: 92  Resp: 16  Temp: 98.7 F (37.1 C)    Body mass index is 22.15 kg/m. No intake or output data in the 24 hours ending 04/22/17 1436   Sclerae unicteric  Diffuse confluent skin rash around his left eyes, and left forehead, no blisters   Oropharynx clear  No peripheral adenopathy  Lungs clear -- no rales or rhonchi  Heart regular rate and rhythm  Abdomen benign  MSK no focal spinal tenderness, no peripheral edema  Neuro nonfocal   CBG (last 3)  No results for input(s): GLUCAP in the last 72 hours.   Labs:  Lab Results  Component Value Date   WBC 3.0 (L) 03/18/2017   HGB 14.7 03/18/2017   HCT 43.6 03/18/2017   MCV 106.5 (H) 03/18/2017   PLT 181 03/18/2017   NEUTROABS 1.5 03/18/2017   CMP Latest Ref Rng & Units 04/22/2017 03/18/2017 03/18/2017  Glucose 65 - 99 mg/dL 95 102 -  BUN 6 - 20 mg/dL 6 6.8(L) -  Creatinine 0.61 - 1.24 mg/dL 1.34(H) 1.0 -  Sodium 135 - 145 mmol/L 134(L) 137 -  Potassium 3.5 - 5.1 mmol/L 3.3(L) 3.4(L) -  Chloride 101 - 111 mmol/L 103 - -  CO2 22 - 32 mmol/L 23 19(L) -  Calcium 8.9 - 10.3 mg/dL 9.1 10.0 -  Total Protein 6.5 - 8.1 g/dL 7.9 6.4 6.1  Total Bilirubin 0.3 - 1.2 mg/dL 0.9 0.57 -  Alkaline Phos 38 - 126 U/L 150(H) 115 -  AST 15 - 41  U/L 48(H) 30 -  ALT 17 - 63 U/L 50 38 -     Urine Studies No results for input(s): UHGB, CRYS in the last 72 hours.  Invalid input(s): UACOL, UAPR, USPG, UPH, UTP, UGL, UKET, UBIL, UNIT, UROB, ULEU, UEPI, UWBC, URBC, UBAC, CAST, UCOM, BILUA  Basic Metabolic Panel: No results for input(s): NA, K, CL, CO2, GLUCOSE, BUN, CREATININE, CALCIUM, MG, PHOS in the last 168 hours. GFR CrCl cannot be calculated (Patient's most recent lab result is older than the maximum 21 days allowed.). Liver Function Tests: No results for input(s): AST, ALT, ALKPHOS, BILITOT, PROT, ALBUMIN in the last 168 hours. No results for input(s): LIPASE, AMYLASE in the last 168 hours. No results for input(s): AMMONIA in the last 168 hours. Coagulation profile No results for input(s): INR, PROTIME in the last 168 hours.  CBC: No results for input(s): WBC, NEUTROABS, HGB, HCT, MCV, PLT in the last 168 hours. Cardiac Enzymes: No results for input(s): CKTOTAL, CKMB, CKMBINDEX, TROPONINI in the last 168 hours. BNP: Invalid input(s): POCBNP CBG: No results for input(s): GLUCAP in the last 168 hours. D-Dimer No results for input(s): DDIMER in the last 72 hours. Hgb A1c No results for input(s): HGBA1C in  the last 72 hours. Lipid Profile No results for input(s): CHOL, HDL, LDLCALC, TRIG, CHOLHDL, LDLDIRECT in the last 72 hours. Thyroid function studies No results for input(s): TSH, T4TOTAL, T3FREE, THYROIDAB in the last 72 hours.  Invalid input(s): FREET3 Anemia work up No results for input(s): VITAMINB12, FOLATE, FERRITIN, TIBC, IRON, RETICCTPCT in the last 72 hours. Microbiology No results found for this or any previous visit (from the past 240 hour(s)).    Studies:  No results found.  Assessment: 66 y.o. patient with past medical history of multiple myeloma in remission, on maintenance Revlimid, he was sent to ED with diagnosis of Shingles per his dermatologist today  1. Ophthalmic herpes zoster 2.  Multiple myeloma in remission, s/p bone marrow transplant, on Revlimid maintenance therapy, immunocompromised 3. History of T12 pathological compression fracture secondary to MM 4. Coronary artery disease, cardiomopathy with EF of 35-40%, recovered to 55-60%  Plan:  -Due to the location of her bases lost, please consider consult ophthalmologist -Due to her immunocompromise status, IV acyclovir is preferred, consider hospital admission for treatment and observation  -I told pt to hold Revlimid, until he completely recovers -I'll see him in 2 weeks after his hospital discharge, for follow up and to determine when to restart his Revlimid.  Maryla Morrow 04/22/2017  2:36 PM

## 2017-04-22 NOTE — H&P (Signed)
History and Physical    Eric Dudley VXY:[id number] DOB: 1951-06-30 DOA: 04/22/2017  PCP: [name], MD  Patient coming from: Home  Chief Complaint: Rash/Shingles  HPI: Eric Dudley is a 66 y.o. male with medical history significant of multiple myeloma s/p bone marrow transplant who presents with rash that started today. He states that he noticed rash forming around his forehead, left side of his face and was seen at his primary care's office. At that time, primary care physician urgently sent him over to a dermatologist who diagnosed him with disseminated shingles. He was advised to come to the emergency department due to his immunosuppression for IV antiviral treatment. Patient denies any pain. Overall, he feels his baseline. Denies any fevers, chills or chest pain or shortness of breath, no nausea, vomiting or diarrhea, no abdominal pain. He has no pain with his rash and no itching. He states that he had sharp stabbing left eye pain yesterday, which has now resolved. He denies any visual changes, although he did have some blurry vision earlier today.   ED Course: Patient was started on IV acyclovir. ED physician also completed a slit lamp exam which was normal. Oncology and ophthalmology been consulted.  Review of Systems: As per HPI otherwise 10 point review of systems negative.   Past Medical History:  Diagnosis Date  . Cholelithiases   . Chronic kidney disease    MILD,CHRONIC  . Dyslipidemia   . Hyperlipidemia   . Hypertension   . 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 APPENDECTOMY  1962  . CARDIAC CATHETERIZATION N/A 04/30/2015   Procedure: Left Heart Cath and Coronary Angiography;  Surgeon: Peter M Martinique, MD;  Location: [facility] INVASIVE CV LAB CUPID;  Service: Cardiovascular;  Laterality: N/A;  . CARDIOLITE MYOCARDIAL PERFUSION STUDY  04/16/03   NEGITIVE BRUCE PROTOCAL EXERCISE STRESS TEST. EF 70%. NO ISCHEMIA.  [name] KNEE  SURGERY  2012   right  . [place]   left     reports that he quit smoking about 13 years ago. His smoking use included Cigars. He quit smokeless tobacco use about 22 years ago. His smokeless tobacco use included Chew. He reports that he drinks alcohol. He reports that he does not use drugs.  Allergies  Allergen Reactions  . Hydrochlorothiazide Rash and Palpitations  . Asa [Aspirin] Nausea Only  . Calcium Carbonate Nausea And Vomiting  . Oxycodone Other (See Comments)    Patient does not want to take. Severe Hallucinations.  . Amoxicillin Itching and Rash  . Indapamide Palpitations    Lightheadedness, dizziness    Family History  Problem Relation Age of Onset  . Cancer Mother 64    cervical cancer   . Cancer Father     unknown cancer   . Hypertension Sister   . Cancer Sister     melanoma     Prior to Admission medications   Medication Sig Start Date End Date Taking? Authorizing Provider  aspirin 81 MG chewable tablet Chew 1 tablet (81 mg total) by mouth daily. Patient taking differently: Chew 81 mg by mouth at bedtime.  05/01/15  Yes Costin Karlyne Greenspan, MD  calcium carbonate (OS-CAL) 600 MG TABS tablet Take 1,200 mg by mouth at bedtime.    Yes Historical Provider, MD  Cholecalciferol (VITAMIN D3) 1000 units CAPS Take 2,000 mg by mouth daily.   Yes Historical Provider, MD  diphenhydrAMINE (BENADRYL) 25 mg capsule Take 25 mg  by mouth at bedtime as needed.   Yes Historical Provider, MD  guaiFENesin (MUCINEX) 600 MG 12 hr tablet Take 1,200 mg by mouth.   Yes Historical Provider, MD  lenalidomide (REVLIMID) 5 MG capsule Take Revlimid  5 mg by mouth daily  For  21 days ,  Rest  7 days. 04/02/17  Yes Truitt Merle, MD  Multiple Vitamins-Minerals (CENTRUM SILVER ULTRA MENS PO) Take 1 tablet by mouth at bedtime.    Yes Historical Provider, MD  nitroGLYCERIN (NITROSTAT) 0.4 MG SL tablet Place 0.4 mg under the tongue every 5 (five) minutes x 3 doses as needed. Reported on 05/19/2016    Yes Historical Provider, MD  ondansetron (ZOFRAN) 4 MG tablet TAKE 1 TABLET FOUR TIMES A DAY AS NEEDED FOR NAUSEA OR VOMITING 04/15/17  Yes Truitt Merle, MD  polyethylene glycol (MIRALAX / GLYCOLAX) packet Take 17 g by mouth daily as needed.    Yes Historical Provider, MD  potassium chloride (MICRO-K) 10 MEQ CR capsule Take 20 mEq by mouth daily. 09/04/15 12/18/17 Yes Historical Provider, MD  prochlorperazine (COMPAZINE) 10 MG tablet Take 10 mg by mouth at bedtime as needed for nausea.  10/04/16  Yes Historical Provider, MD  ranitidine (ZANTAC) 150 MG tablet Take 2 tablets (300 mg total) by mouth at bedtime. Patient taking differently: Take 150 mg by mouth at bedtime.  05/24/15  Yes Truitt Merle, MD  Zoledronic Acid (ZOMETA) 4 MG/100ML IVPB Inject 4 mg into the vein. EVERY 60 DAYS   Yes Historical Provider, MD  gabapentin (NEURONTIN) 100 MG capsule Take 1 capsule (100 mg total) by mouth 3 (three) times daily. Patient not taking: Reported on 03/18/2017 12/18/16   Truitt Merle, MD  lidocaine (LIDODERM) 5 % Place 1 patch onto the skin daily. Remove & Discard patch within 12 hours or as directed by MD Patient not taking: Reported on 04/22/2017 07/09/16   Truitt Merle, MD  potassium chloride (MICRO-K) 10 MEQ CR capsule TAKE 3 CAPSULES DAILY Patient not taking: Reported on 04/22/2017 03/09/17   Truitt Merle, MD    Physical Exam: Vitals:   04/22/17 1328 04/22/17 1329 04/22/17 1551  BP: (!) 136/91  136/72  Pulse: 92  85  Resp: 16  14  Temp: 98.7 F (37.1 C)    TempSrc: Oral    SpO2: 98%  99%  Weight:  68 kg (150 lb)   Height:  '5\' 9"'  (1.753 m)      Constitutional: NAD, calm, comfortable Eyes: PERRL, left scleral injection ENMT: Mucous membranes are moist. Posterior pharynx clear of any exudate or lesions.Normal dentition.  Neck: normal, supple, no masses, no thyromegaly Respiratory: clear to auscultation bilaterally, no wheezing, no crackles. Normal respiratory effort. No accessory muscle use.  Cardiovascular: Regular  rate and rhythm, no murmurs / rubs / gallops. No extremity edema. 2+ pedal pulses. No carotid bruits.  Abdomen: no tenderness, no masses palpated. No hepatosplenomegaly. Bowel sounds positive.  Musculoskeletal: no clubbing / cyanosis. No joint deformity upper and lower extremities. Normal muscle tone.  Skin: disseminated rash over bilateral forehead, around left eye, right ear, upper back, chest and abdomen  Neurologic: CN 2-12 grossly intact. Strength 5/5 in all 4.  Psychiatric: Normal judgment and insight. Alert and oriented x 3. Normal mood.   Labs on Admission: I have personally reviewed following labs and imaging studies  CBC:  Recent Labs Lab 04/22/17 1550  WBC 3.6*  NEUTROABS 2.6  HGB 16.8  HCT 49.1  MCV 104.9*  PLT 122*  Basic Metabolic Panel:  Recent Labs Lab 04/22/17 1550  NA 134*  K 3.3*  CL 103  CO2 23  GLUCOSE 95  BUN 6  CREATININE 1.34*  CALCIUM 9.1   GFR: Estimated Creatinine Clearance: 51.5 mL/min (A) (by C-G formula based on SCr of 1.34 mg/dL (H)). Liver Function Tests:  Recent Labs Lab 04/22/17 1550  AST 48*  ALT 50  ALKPHOS 150*  BILITOT 0.9  PROT 7.9  ALBUMIN 4.5   No results for input(s): LIPASE, AMYLASE in the last 168 hours. No results for input(s): AMMONIA in the last 168 hours. Coagulation Profile: No results for input(s): INR, PROTIME in the last 168 hours. Cardiac Enzymes: No results for input(s): CKTOTAL, CKMB, CKMBINDEX, TROPONINI in the last 168 hours. BNP (last 3 results) No results for input(s): PROBNP in the last 8760 hours. HbA1C: No results for input(s): HGBA1C in the last 72 hours. CBG: No results for input(s): GLUCAP in the last 168 hours. Lipid Profile: No results for input(s): CHOL, HDL, LDLCALC, TRIG, CHOLHDL, LDLDIRECT in the last 72 hours. Thyroid Function Tests: No results for input(s): TSH, T4TOTAL, FREET4, T3FREE, THYROIDAB in the last 72 hours. Anemia Panel: No results for input(s): VITAMINB12, FOLATE,  FERRITIN, TIBC, IRON, RETICCTPCT in the last 72 hours. Urine analysis:    Component Value Date/Time   COLORURINE YELLOW 04/16/2015 1130   APPEARANCEUR CLOUDY (A) 04/16/2015 1130   LABSPEC 1.020 04/16/2015 1130   PHURINE 7.0 04/16/2015 1130   GLUCOSEU 500 (A) 04/16/2015 1130   HGBUR LARGE (A) 04/16/2015 1130   BILIRUBINUR NEGATIVE 04/16/2015 1130   KETONESUR NEGATIVE 04/16/2015 1130   PROTEINUR 100 (A) 04/16/2015 1130   UROBILINOGEN 0.2 04/16/2015 1130   NITRITE NEGATIVE 04/16/2015 1130   LEUKOCYTESUR NEGATIVE 04/16/2015 1130   Sepsis Labs: !!!!!!!!!!!!!!!!!!!!!!!!!!!!!!!!!!!!!!!!!!!! '@LABRCNTIP' (procalcitonin:4,lacticidven:4) )No results found for this or any previous visit (from the past 240 hour(s)).   Radiological Exams on Admission: No results found.  Assessment/Plan Principal Problem:   Shingles Active Problems:   Multiple myeloma in remission (HCC)   Cardiomyopathy, nonischemic (HCC)  Disseminated zoster in immunosuppressed patient -Oncology/Dr. Burr Medico evaluated patient in ED -EDP did slit lamp exam which was unremarkable -Ophthalmology consulted by EDP, they recommend IV acyclovir and will exam patient tomorrow or Saturday AM -IV acyclovir   Multiple myeloma -Followed by Dr. Burr Medico  Nonobstructive CAD, [place], STEMI -Followed by Dr. Debara Pickett -Aspirin    DVT prophylaxis: lovenox Code Status: full  Family Communication: at bedside Disposition Plan: pending improvement Consults called: Oncology and Ophthalmology by EDP  Admission status: Inpatient    Dessa Phi, DO [facility] [email or web address] Password [facility] 04/22/2017, 5:35 PM

## 2017-04-22 NOTE — ED Provider Notes (Signed)
[facility] DEPT Provider Note   CSN: [id number] Arrival date & time: 04/22/17  1320     History   Chief Complaint Chief Complaint  Patient presents with  . Rash    HPI Eric Dudley is a 66 y.o. male.  HPI  66 y.o. male  with past medical history of hypertension and tonsil cancer 10 years ago, status post surgery and radiation, and multiple myeloma on immunosuppressives who is asked to come to the Er with a diagnosis of zoster. Pt reports that he woke up with a rash to his face, and his torso. Pt had no rash yday. Pt has no pain to the site and there has been no drainage. Pt has rash around the L eye, he reports some blurry vision, but he has no pain or tearing.   Pt went to the PCP and then dermatologist, and was diagnosed with zoster. Oncology team is aware of the patient's rash, and recommended that he come to the ER. Past Medical History:  Diagnosis Date  . Cholelithiases   . Chronic kidney disease    MILD,CHRONIC  . Dyslipidemia   . Hyperlipidemia   . Hypertension   . Tonsillar cancer [facility]) 2006    Patient Active Problem List   Diagnosis Date Noted  . Shingles 04/22/2017  . Dyslipidemia 05/27/2016  . Thoracic compression fracture ([place])   . Orthostatic hypotension 10/17/2015  . Osteoporosis 05/18/2015  . NSTEMI (non-ST elevated myocardial infarction) ([place]) 05/01/2015  . Cardiomyopathy, nonischemic ([place])   . ST elevation myocardial infarction involving left anterior descending (LAD) coronary artery ([place])   . Syncope and collapse 04/26/2015  . Syncope 04/26/2015  . AKI (acute kidney injury) ([place])   . Vertebral compression fracture: T12 pathological fracture 04/17/2015  . T12 compression fracture ([place])   . Thoracic back pain   . Acute back pain 04/16/2015  . Multiple myeloma in remission ([place]) 04/03/2015  . Essential hypertension 07/08/2013  . Hyperlipidemia 07/08/2013  . Tonsillar cancer ([place]) 07/08/2013  . CKD (chronic kidney disease), stage II  07/08/2013  . Irregular heartbeat 07/08/2013    Past Surgical History:  Procedure Laterality Date  . APPENDECTOMY  1962  . CARDIAC CATHETERIZATION N/A 04/30/2015   Procedure: Left Heart Cath and Coronary Angiography;  Surgeon: Peter M Martinique, MD;  Location: [facility] INVASIVE CV LAB CUPID;  Service: Cardiovascular;  Laterality: N/A;  . CARDIOLITE MYOCARDIAL PERFUSION STUDY  04/16/03   NEGITIVE BRUCE PROTOCAL EXERCISE STRESS TEST. EF 70%. NO ISCHEMIA.  [name] KNEE SURGERY  2012   right  . SHOULDER SURGERY  1999   left       Home Medications    Prior to Admission medications   Medication Sig Start Date End Date Taking? Authorizing Provider  aspirin 81 MG chewable tablet Chew 1 tablet (81 mg total) by mouth daily. Patient taking differently: Chew 81 mg by mouth at bedtime.  05/01/15  Yes Costin Karlyne Greenspan, MD  calcium carbonate (OS-CAL) 600 MG TABS tablet Take 1,200 mg by mouth at bedtime.    Yes Historical Provider, MD  Cholecalciferol (VITAMIN D3) 1000 units CAPS Take 2,000 mg by mouth daily.   Yes Historical Provider, MD  diphenhydrAMINE (BENADRYL) 25 mg capsule Take 25 mg by mouth at bedtime as needed.   Yes Historical Provider, MD  guaiFENesin (MUCINEX) 600 MG 12 hr tablet Take 1,200 mg by mouth.   Yes Historical Provider, MD  lenalidomide (REVLIMID) 5 MG capsule Take Revlimid  5 mg by mouth daily  For  21 days ,  Rest  7 days. 04/02/17  Yes Truitt Merle, MD  Multiple Vitamins-Minerals (CENTRUM SILVER ULTRA MENS PO) Take 1 tablet by mouth at bedtime.    Yes Historical Provider, MD  nitroGLYCERIN (NITROSTAT) 0.4 MG SL tablet Place 0.4 mg under the tongue every 5 (five) minutes x 3 doses as needed. Reported on 05/19/2016   Yes Historical Provider, MD  ondansetron (ZOFRAN) 4 MG tablet TAKE 1 TABLET FOUR TIMES A DAY AS NEEDED FOR NAUSEA OR VOMITING 04/15/17  Yes Truitt Merle, MD  polyethylene glycol (MIRALAX / GLYCOLAX) packet Take 17 g by mouth daily as needed.    Yes Historical Provider, MD  potassium chloride  (MICRO-K) 10 MEQ CR capsule Take 20 mEq by mouth daily. 09/04/15 12/18/17 Yes Historical Provider, MD  prochlorperazine (COMPAZINE) 10 MG tablet Take 10 mg by mouth at bedtime as needed for nausea.  10/04/16  Yes Historical Provider, MD  ranitidine (ZANTAC) 150 MG tablet Take 2 tablets (300 mg total) by mouth at bedtime. Patient taking differently: Take 150 mg by mouth at bedtime.  05/24/15  Yes Truitt Merle, MD  Zoledronic Acid (ZOMETA) 4 MG/100ML IVPB Inject 4 mg into the vein. EVERY 60 DAYS   Yes Historical Provider, MD  gabapentin (NEURONTIN) 100 MG capsule Take 1 capsule (100 mg total) by mouth 3 (three) times daily. Patient not taking: Reported on 03/18/2017 12/18/16   Truitt Merle, MD  lidocaine (LIDODERM) 5 % Place 1 patch onto the skin daily. Remove & Discard patch within 12 hours or as directed by MD Patient not taking: Reported on 04/22/2017 07/09/16   Truitt Merle, MD  potassium chloride (MICRO-K) 10 MEQ CR capsule TAKE 3 CAPSULES DAILY Patient not taking: Reported on 04/22/2017 03/09/17   Truitt Merle, MD    Family History Family History  Problem Relation Age of Onset  . Cancer Mother 54    cervical cancer   . Cancer Father     unknown cancer   . Hypertension Sister   . Cancer Sister     melanoma     Social History Social History  Substance Use Topics  . Smoking status: Former Smoker    Types: Cigars    Quit date: 12/29/2003  . Smokeless tobacco: Former Systems developer    Types: Chew    Quit date: 12/28/1994  . Alcohol use Yes     Comment: occasional beer     Allergies   Hydrochlorothiazide; Asa [aspirin]; Calcium carbonate; Oxycodone; Amoxicillin; and Indapamide   Review of Systems Review of Systems  All other systems reviewed and are negative.    Physical Exam Updated Vital Signs BP 136/72   Pulse 85   Temp 98.7 F (37.1 C) (Oral)   Resp 14   Ht '5\' 9"'  (1.753 m)   Wt 150 lb (68 kg)   SpO2 99%   BMI 22.15 kg/m   Physical Exam  Constitutional: He is oriented to person, place, and  time. He appears well-developed.  HENT:  Head: Atraumatic.  Eyes: Conjunctivae are normal. Pupils are equal, round, and reactive to light. Right eye exhibits no discharge. No scleral icterus.  Eye exam: EOMI, PERRL No photophobia Gross bedside visual acuity via snellen chart reveals no significant visual deficits. Eye lid eversion reveals no foreign body Pt has no chemosis Fluorecin test: no dye uptake Slit lamp exam reveals no dendritic findings, no significant cell flair.    Visual Acuity  Right Eye Distance: 20/25 (pt. had no glasses on. ) Left Eye  Distance: 20/25 (pt. had no glassses on. ) Bilateral Distance: 20/16 (pt. had glasses on.  20/16)       Neck: Neck supple.  Cardiovascular: Normal rate.   Pulmonary/Chest: Effort normal.  Neurological: He is alert and oriented to person, place, and time.  Skin: Skin is warm. Rash noted.  Nursing note and vitals reviewed.    ED Treatments / Results  Labs (all labs ordered are listed, but only abnormal results are displayed) Labs Reviewed  CBC WITH DIFFERENTIAL/PLATELET - Abnormal; Notable for the following:       Result Value   WBC 3.6 (*)    MCV 104.9 (*)    MCH 35.9 (*)    Platelets 122 (*)    Lymphs Abs 0.4 (*)    All other components within normal limits  COMPREHENSIVE METABOLIC PANEL - Abnormal; Notable for the following:    Sodium 134 (*)    Potassium 3.3 (*)    Creatinine, Ser 1.34 (*)    AST 48 (*)    Alkaline Phosphatase 150 (*)    GFR calc non Af Amer 53 (*)    All other components within normal limits    EKG  EKG Interpretation None       Radiology No results found.  Procedures Procedures (including critical care time)         Medications Ordered in ED Medications  acyclovir (ZOVIRAX) 680 mg in dextrose 5 % 100 mL IVPB (680 mg Intravenous New Bag/Given 04/22/17 1639)  fluorescein ophthalmic strip 1 strip (1 strip Left Eye Given by Other 04/22/17 1551)  tetracaine (PONTOCAINE) 0.5 %  ophthalmic solution 2 drop (2 drops Left Eye Given by Other 04/22/17 1551)     Initial Impression / Assessment and Plan / ED Course  I have reviewed the triage vital signs and the nursing notes.  Pertinent labs & imaging results that were available during my care of the patient were reviewed by me and considered in my medical decision making (see chart for details).     Pt comes in with cc of skin rash to the face and torso. He was diagnosed with zoster by dermatologist and advised to come to the ER. Pt has no eye symptoms currently. Rash is disseminated. Oncology aware.  Given immunosuppressed with disseminated infection, we will start iv acyclovir and admit. PT is asymptomatic right now. Eye exam is normal - still, Dr. Prudencio Burly from optho has been consulted. He recommends: - Iv antiviral. - Pt will be seen by them tomorrow, or possibly even Saturday morning.  Final Clinical Impressions(s) / ED Diagnoses   Final diagnoses:  Zoster ophthalmicus  Disseminated herpes zoster  Rash    New Prescriptions New Prescriptions   No medications on file     Varney Biles, MD 04/22/17 1700

## 2017-04-22 NOTE — ED Notes (Signed)
ED Provider at bedside. 

## 2017-04-23 DIAGNOSIS — B027 Disseminated zoster: Principal | ICD-10-CM

## 2017-04-23 DIAGNOSIS — E876 Hypokalemia: Secondary | ICD-10-CM

## 2017-04-23 LAB — COMPREHENSIVE METABOLIC PANEL
ALT: 52 U/L (ref 17–63)
AST: 46 U/L — AB (ref 15–41)
Albumin: 4.4 g/dL (ref 3.5–5.0)
Alkaline Phosphatase: 153 U/L — ABNORMAL HIGH (ref 38–126)
Anion gap: 10 (ref 5–15)
BILIRUBIN TOTAL: 1.1 mg/dL (ref 0.3–1.2)
BUN: 8 mg/dL (ref 6–20)
CALCIUM: 9.3 mg/dL (ref 8.9–10.3)
CO2: 24 mmol/L (ref 22–32)
CREATININE: 1.46 mg/dL — AB (ref 0.61–1.24)
Chloride: 100 mmol/L — ABNORMAL LOW (ref 101–111)
GFR calc Af Amer: 56 mL/min — ABNORMAL LOW (ref >60)
GFR, EST NON AFRICAN AMERICAN: 48 mL/min — AB (ref >60)
Glucose, Bld: 110 mg/dL — ABNORMAL HIGH (ref 65–99)
Potassium: 3.4 mmol/L — ABNORMAL LOW (ref 3.5–5.1)
Sodium: 134 mmol/L — ABNORMAL LOW (ref 135–145)
TOTAL PROTEIN: 7.7 g/dL (ref 6.5–8.1)

## 2017-04-23 LAB — CBC
HEMATOCRIT: 45.9 % (ref 39.0–52.0)
Hemoglobin: 16 g/dL (ref 13.0–17.0)
MCH: 35.7 pg — AB (ref 26.0–34.0)
MCHC: 34.9 g/dL (ref 30.0–36.0)
MCV: 102.5 fL — AB (ref 78.0–100.0)
Platelets: 134 10*3/uL — ABNORMAL LOW (ref 150–400)
RBC: 4.48 MIL/uL (ref 4.22–5.81)
RDW: 13.5 % (ref 11.5–15.5)
WBC: 3.3 10*3/uL — AB (ref 4.0–10.5)

## 2017-04-23 MED ORDER — ZOLPIDEM TARTRATE 5 MG PO TABS
5.0000 mg | ORAL_TABLET | Freq: Once | ORAL | Status: AC
  Administered 2017-04-23: 5 mg via ORAL
  Filled 2017-04-23 (×2): qty 1

## 2017-04-23 MED ORDER — POLYETHYLENE GLYCOL 3350 17 G PO PACK: 17.0000 g | PACK | Freq: Every day | ORAL | Status: DC | PRN

## 2017-04-23 MED ORDER — DIPHENHYDRAMINE HCL 25 MG PO CAPS
25.0000 mg | ORAL_CAPSULE | Freq: Every evening | ORAL | Status: DC | PRN
  Administered 2017-04-24 – 2017-04-25 (×2): 25 mg via ORAL
  Filled 2017-04-23 (×3): qty 1

## 2017-04-23 MED ORDER — POTASSIUM CHLORIDE 20 MEQ/15ML (10%) PO SOLN: 40.0000 meq | Freq: Once | ORAL | Status: DC

## 2017-04-23 MED ORDER — ONDANSETRON HCL 4 MG PO TABS
4.0000 mg | ORAL_TABLET | Freq: Four times a day (QID) | ORAL | Status: DC | PRN
  Administered 2017-04-23 – 2017-04-24 (×2): 4 mg via ORAL
  Filled 2017-04-23 (×2): qty 1

## 2017-04-23 MED ORDER — GUAIFENESIN ER 600 MG PO TB12: 1200.0000 mg | ORAL_TABLET | Freq: Two times a day (BID) | ORAL | Status: DC | PRN

## 2017-04-23 MED ORDER — POTASSIUM CHLORIDE CRYS ER 20 MEQ PO TBCR
40.0000 meq | EXTENDED_RELEASE_TABLET | Freq: Once | ORAL | Status: DC
  Administered 2017-04-23: 40 meq via ORAL
  Filled 2017-04-23 (×2): qty 2

## 2017-04-23 NOTE — Progress Notes (Signed)
Patient ID: Eric Dudley, male   DOB: 22-Sep-1951, 66 y.o.   MRN: [id number]   PROGRESS NOTE    DWON SKY  PIR:[id number] DOB: 31-Mar-1951 DOA: 04/22/2017  PCP: [name], MD   Outpatient Specialists: Oncology, Dr. Burr Medico  Brief Narrative:  66 y.o. 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 Patient presented to [facility] long with worsening rash started on the day prior to this admission around the forehead and left side of the face. He was seen at primary care office and he was urgently sent to dermatologist who diagnosed the patient with disseminated shingles. He was advised to come to the ED because of immunosuppression any need for IV antiviral treatment. Patient did not have any pain over the area. No fevers or chills. He did have sharp stabbing left eye pain the day prior to this admission but that pain has resolved on the admission. Patient was hemodynamically stable in ED, started on IV acyclovir. Slit-lamp exam was normal. Oncology and ophthalmology have been consulted   Assessment & Plan:   Principal Problem:   Disseminated ophthalmic herpes zoster - Appreciate ophthalmology recommendations - Plan for outpatient ophtho exam to allow detailed slit lamp exam of anterior segment and dilated fundus exam.  Appt is made for Monday 04/26/17 @ 8:30a.m. - Continue IV acyclovir - Continue pain management efforts  Active Problems:   Multiple myeloma in remission ([place]) - Patient follows with Dr. 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    Leukopenia / thrombocytopenia - Likely secondary to multiple myeloma - White blood cell count 3.3 - Platelet count 134    Hypokalemia  - Potassium 3.4 today, supplemented - Follow-up BMP tomorrow morning    Acute kidney injury -  Likely due to acute infection - We'll continue to monitor renal function as patient is on acyclovir - Creatinine 1.46 this morning   DVT prophylaxis: Lovenox subcutaneous; platelets are 134. Although slightly lower than the normal range will have a low threshold to stop Lovenox should platelet count dropped Code Status: Full code Family Communication: Patient at bedside  Disposition Plan: Not yet stable for discharge. Anticipate discharge in next 1-2 days if there is improvement in shingles  Consultants:   Ophthalmology  Oncology  Procedures:   None  Antimicrobials:   IV acyclovir    Subjective: No overnight events.  Objective: Vitals:   04/22/17 1811 04/22/17 1832 04/22/17 2139 04/23/17 0525  BP: 131/70 126/77 (!) 117/49 (!) 110/54  Pulse: (!) 106 (!) 101 91 60  Resp: '14 15 16 14  ' Temp:   98 F (36.7 C) (!) 100.4 F (38 C)  TempSrc:   Oral Oral  SpO2: 97% 100% 98% 99%  Weight:      Height:        Intake/Output Summary (Last 24 hours) at 04/23/17 1438 Last data filed at 04/23/17 0525  Gross per 24 hour  Intake            713.6 ml  Output                0 ml  Net            713.6 ml   Filed Weights   04/22/17 1329  Weight: 68 kg (150 lb)    Examination:  General exam: Appears calm and comfortable  Respiratory system: Clear to auscultation. Respiratory effort normal. Cardiovascular system: S1 & S2 heard, RRR. No JVD, murmurs, rubs, gallops or clicks. No pedal edema. Gastrointestinal system: Abdomen is nondistended, soft and nontender. No organomegaly or masses felt. Normal bowel sounds heard. Central nervous system: Alert and oriented. No focal neurological deficits. Extremities: Symmetric 5 x 5 power. Skin: Patient with zoster rash around the left eye Psychiatry: Judgement and insight appear normal. Mood & affect appropriate.    Data Reviewed: I have personally reviewed following labs and imaging studies  CBC:  Recent Labs Lab 04/22/17 1550  04/23/17 0453  WBC 3.6* 3.3*  NEUTROABS 2.6  --   HGB 16.8 16.0  HCT 49.1 45.9  MCV 104.9* 102.5*  PLT 122* 300*   Basic Metabolic Panel:  Recent Labs Lab 04/22/17 1550 04/23/17 0706  NA 134* 134*  K 3.3* 3.4*  CL 103 100*  CO2 23 24  GLUCOSE 95 110*  BUN 6 8  CREATININE 1.34* 1.46*  CALCIUM 9.1 9.3   GFR: Estimated Creatinine Clearance: 47.2 mL/min (A) (by C-G formula based on SCr of 1.46 mg/dL (H)). Liver Function Tests:  Recent Labs Lab 04/22/17 1550 04/23/17 0706  AST 48* 46*  ALT 50 52  ALKPHOS 150* 153*  BILITOT 0.9 1.1  PROT 7.9 7.7  ALBUMIN 4.5 4.4   No results for input(s): LIPASE, AMYLASE in the last 168 hours. No results for input(s): AMMONIA in the last 168 hours. Coagulation Profile: No results for input(s): INR, PROTIME in the last 168 hours. Cardiac Enzymes: No results for input(s): CKTOTAL, CKMB, CKMBINDEX, TROPONINI in the last 168 hours. BNP (last 3 results) No results for input(s): PROBNP in the last 8760 hours. HbA1C: No results for input(s): HGBA1C in the last 72 hours. CBG: No results for input(s): GLUCAP in the last 168 hours. Lipid Profile: No results for input(s): CHOL, HDL, LDLCALC, TRIG, CHOLHDL, LDLDIRECT in the last 72 hours. Thyroid Function Tests: No results for input(s): TSH, T4TOTAL, FREET4, T3FREE, THYROIDAB in the last 72 hours. Anemia Panel: No results for input(s): VITAMINB12, FOLATE, FERRITIN, TIBC, IRON, RETICCTPCT in the last 72 hours. Urine analysis:  Sepsis Labs: '@LABRCNTIP' (procalcitonin:4,lacticidven:4)  )No results found for this or any previous visit (from the past 240 hour(s)).    Radiology Studies: No results found.    Scheduled Meds: . aspirin  81 mg Oral QHS  . calcium carbonate  1,250 mg Oral QHS  . enoxaparin (LOVENOX) injection  40 mg Subcutaneous Q24H  . famotidine  20 mg Oral QHS  . potassium chloride  40 mEq Oral Once  . sodium chloride flush  3 mL Intravenous Q12H  . zolpidem  5 mg  Oral Once   Continuous Infusions: . sodium chloride    . acyclovir Stopped (04/23/17 1427)     LOS: 1 day    Time spent: 20 minutes    Faye Ramsay, MD [facility] Pager [phone]  If 7PM-7AM, please contact night-coverage [email or web address] Password [facility] 04/23/2017, 2:38 PM

## 2017-04-23 NOTE — Progress Notes (Signed)
  Spoke to ED physician last night re: admission of patient.   Reviewed H&P, ED note (including patient pics) and Onc note re: 66 yo M with hx MM s/p BMT admitted with Zoster, including V1 distribution.    Spoke to Eric Dudley this morning who endorses pain/discomfort in dermatomal distrubtion around left eye.  He reports that left eye itself "feels fine."  He reports vision OS is normal and clear.  He denies diplopia, flashes, floaters or any other ocular symptoms.    Per the patient, his hospital plan includes several days of IV antivirals.    Recommendations:   -- Agree with admission for IV antiviral  -- Outpatient ophtho exam to allow detailed slit lamp exam of anterior segment and dilated fundus exam.  Appt is made for Monday 04/26/17 @ 8:30a.m. -- I've reviewed ophthalmic warning signs and symptoms with the patient which he will alert hospital care team should they occur over the weekend.

## 2017-04-24 LAB — CBC
HCT: 47.1 % (ref 39.0–52.0)
Hemoglobin: 16.3 g/dL (ref 13.0–17.0)
MCH: 35.4 pg — ABNORMAL HIGH (ref 26.0–34.0)
MCHC: 34.6 g/dL (ref 30.0–36.0)
MCV: 102.4 fL — ABNORMAL HIGH (ref 78.0–100.0)
PLATELETS: 95 10*3/uL — AB (ref 150–400)
RBC: 4.6 MIL/uL (ref 4.22–5.81)
RDW: 13.3 % (ref 11.5–15.5)
WBC: 3.2 10*3/uL — ABNORMAL LOW (ref 4.0–10.5)

## 2017-04-24 LAB — BASIC METABOLIC PANEL
ANION GAP: 9 (ref 5–15)
BUN: 11 mg/dL (ref 6–20)
CALCIUM: 8.7 mg/dL — AB (ref 8.9–10.3)
CO2: 21 mmol/L — AB (ref 22–32)
CREATININE: 1.39 mg/dL — AB (ref 0.61–1.24)
Chloride: 103 mmol/L (ref 101–111)
GFR calc Af Amer: 59 mL/min — ABNORMAL LOW (ref >60)
GFR, EST NON AFRICAN AMERICAN: 51 mL/min — AB (ref >60)
GLUCOSE: 112 mg/dL — AB (ref 65–99)
Potassium: 2.9 mmol/L — ABNORMAL LOW (ref 3.5–5.1)
Sodium: 133 mmol/L — ABNORMAL LOW (ref 135–145)

## 2017-04-24 LAB — MAGNESIUM: MAGNESIUM: 1.9 mg/dL (ref 1.7–2.4)

## 2017-04-24 MED ORDER — POTASSIUM CHLORIDE CRYS ER 20 MEQ PO TBCR
40.0000 meq | EXTENDED_RELEASE_TABLET | Freq: Two times a day (BID) | ORAL | Status: AC
  Administered 2017-04-24 (×2): 40 meq via ORAL
  Filled 2017-04-24 (×2): qty 2

## 2017-04-24 MED ORDER — ACETAMINOPHEN 325 MG PO TABS
650.0000 mg | ORAL_TABLET | Freq: Once | ORAL | Status: AC
  Administered 2017-04-24: 650 mg via ORAL
  Filled 2017-04-24: qty 2

## 2017-04-24 MED ORDER — ACETAMINOPHEN 325 MG PO TABS
650.0000 mg | ORAL_TABLET | ORAL | Status: DC | PRN
  Administered 2017-04-24 – 2017-04-25 (×3): 650 mg via ORAL
  Filled 2017-04-24 (×4): qty 2

## 2017-04-24 MED ORDER — ZOLPIDEM TARTRATE 5 MG PO TABS
5.0000 mg | ORAL_TABLET | Freq: Once | ORAL | Status: AC
Start: 2017-04-24 — End: 2017-04-24
  Administered 2017-04-24: 5 mg via ORAL
  Filled 2017-04-24: qty 1

## 2017-04-24 NOTE — Progress Notes (Signed)
Pt has c/o of headache.  Of note pt got a dose of tylenol during the night for similar complaint and reports that it helped him.  MD notified and order for tylenol obtained and administered.   Pt also requests det be changed to regular from heart healthy, states does not eat the food offered on the heart healthy diet.  MD notified of pt's request. Will continue to monitor.

## 2017-04-24 NOTE — Progress Notes (Signed)
Patient ID: Eric Dudley, male   DOB: 1951/01/28, 66 y.o.   MRN: [id number]   PROGRESS NOTE  Eric Dudley  [id number] DOB: January 23, 1951 DOA: 04/22/2017  PCP: [name], MD   Outpatient Specialists: Oncology, Dr. Burr Medico  Brief Narrative:  66 y.o. 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 Patient presented to [facility] long with worsening rash started on the day prior to this admission around the forehead and left side of the face. He was seen at primary care office and he was urgently sent to dermatologist who diagnosed the patient with disseminated shingles. He was advised to come to the ED because of immunosuppression any need for IV antiviral treatment. Patient did not have any pain over the area. No fevers or chills. He did have sharp stabbing left eye pain the day prior to this admission but that pain has resolved on the admission. Patient was hemodynamically stable in ED, started on IV acyclovir. Slit-lamp exam was normal. Oncology and ophthalmology have been consulted  Assessment & Plan:   Principal Problem:   Disseminated ophthalmic herpes zoster - Appreciate ophthalmology recommendations - Plan for outpatient ophtho exam to allow detailed slit lamp exam of anterior segment and dilated fundus exam.  Appt is made for Monday 04/26/17 @ 8:30a.m. - Continue IV acyclovir - Continue pain management efforts  Active Problems:   Multiple myeloma in remission ([place]) - Patient follows with Dr. 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 Although slightly lower than the normal range will have a low threshold to stop Lovenox should platelet count dropped Code Status: Full code Family Communication: Patient at bedside  Disposition Plan: Not yet stable for discharge. Anticipate discharge in next 1-2 days if there is improvement in shingles  Consultants:   Ophthalmology  Oncology  Procedures:   None  Antimicrobials:   IV acyclovir   Subjective: No overnight events.  Objective: Vitals:   04/23/17 0525 04/23/17 1500 04/23/17 2054 04/24/17 0442  BP: (!) 110/54 103/67 122/67 (!) 120/59  Pulse: 60 97 83 (!) 109  Resp: _0 Temp: (!) 100.4 F (38 C) 98.7 F (37.1 C) 98.6 F (37 C) 98 F (36.7 C)  TempSrc: Oral Oral Oral Oral  SpO2: 99% 99% 98% 99%  Weight:      Height:        Intake/Output Summary (Last 24 hours) at 04/24/17 1223 Last data filed at 04/24/17 0955  Gross per 24 hour  Intake           1294.4 ml  Output              600 ml  Net            694.4 ml   [employer]  04/22/17 1329  Weight: 68 kg (150 lb)    Examination:  General exam: Appears calm and comfortable  Respiratory system: Clear to auscultation. Respiratory effort normal. Cardiovascular system: S1 & S2 heard, RRR. No JVD, murmurs, rubs, gallops or clicks. No pedal edema. Gastrointestinal system: Abdomen is nondistended, soft and nontender. No organomegaly or masses felt. Normal bowel sounds heard. Central nervous system: Alert and oriented. No focal neurological deficits. Extremities: Symmetric 5 x 5 power. Skin: Patient with zoster rash around the left eye Psychiatry: Judgement and insight appear normal. Mood & affect appropriate.   Data Reviewed: I have personally reviewed following  labs and imaging studies  CBC:  Recent Labs Lab 04/22/17 1550 04/23/17 0453 04/24/17 0424  WBC 3.6* 3.3* 3.2*  NEUTROABS 2.6  --   --   HGB 16.8 16.0 16.3  HCT 49.1 45.9 47.1  MCV 104.9* 102.5* 102.4*  PLT 122* 134* 95*   Basic Metabolic Panel:  Recent Labs Lab 04/22/17 1550 04/23/17 0706 04/24/17 0424  NA 134* 134* 133*  K 3.3* 3.4* 2.9*  CL 103 100* 103  CO2 23 24 21*  GLUCOSE 95 110* 112*  BUN _0 CREATININE 1.34* 1.46* 1.39*  CALCIUM 9.1 9.3 8.7*   Liver Function Tests:  Recent Labs Lab 04/22/17 1550 04/23/17 0706  AST 48* 46*  ALT 50 52  ALKPHOS 150* 153*  BILITOT 0.9 1.1  PROT 7.9 7.7  ALBUMIN 4.5 4.4   Radiology Studies: No results found.    Scheduled Meds: . aspirin  81 mg Oral QHS  . calcium carbonate  1,250 mg Oral QHS  . enoxaparin (LOVENOX) injection  40 mg Subcutaneous Q24H  . famotidine  20 mg Oral QHS  . potassium chloride  40 mEq Oral Once  . sodium chloride flush  3 mL Intravenous Q12H   Continuous Infusions: . sodium chloride    . acyclovir Stopped (04/24/17 5009)     LOS: 2 days   Time spent: 20 minutes   Faye Ramsay, MD [facility] Pager [phone]  If 7PM-7AM, please contact night-coverage [email or web address] Password [facility] 04/24/2017, 12:23 PM

## 2017-04-25 LAB — CBC
HCT: 46.5 % (ref 39.0–52.0)
Hemoglobin: 16 g/dL (ref 13.0–17.0)
MCH: 35.4 pg — AB (ref 26.0–34.0)
MCHC: 34.4 g/dL (ref 30.0–36.0)
MCV: 102.9 fL — ABNORMAL HIGH (ref 78.0–100.0)
PLATELETS: 91 10*3/uL — AB (ref 150–400)
RBC: 4.52 MIL/uL (ref 4.22–5.81)
RDW: 13.3 % (ref 11.5–15.5)
WBC: 3.3 10*3/uL — ABNORMAL LOW (ref 4.0–10.5)

## 2017-04-25 LAB — BASIC METABOLIC PANEL
Anion gap: 7 (ref 5–15)
BUN: 11 mg/dL (ref 6–20)
CO2: 20 mmol/L — ABNORMAL LOW (ref 22–32)
CREATININE: 1.4 mg/dL — AB (ref 0.61–1.24)
Calcium: 8.5 mg/dL — ABNORMAL LOW (ref 8.9–10.3)
Chloride: 108 mmol/L (ref 101–111)
GFR calc Af Amer: 59 mL/min — ABNORMAL LOW (ref >60)
GFR, EST NON AFRICAN AMERICAN: 50 mL/min — AB (ref >60)
GLUCOSE: 98 mg/dL (ref 65–99)
POTASSIUM: 3.5 mmol/L (ref 3.5–5.1)
Sodium: 135 mmol/L (ref 135–145)

## 2017-04-25 LAB — MAGNESIUM: Magnesium: 1.9 mg/dL (ref 1.7–2.4)

## 2017-04-25 MED ORDER — ZOLPIDEM TARTRATE 5 MG PO TABS
5.0000 mg | ORAL_TABLET | Freq: Every evening | ORAL | Status: DC | PRN
  Administered 2017-04-25: 5 mg via ORAL
  Filled 2017-04-25: qty 1

## 2017-04-25 NOTE — Progress Notes (Signed)
Patient ID: Eric Dudley, male   DOB: 1951-09-12, 66 y.o.   MRN: [id number]   PROGRESS NOTE  Eric Dudley  [id number] DOB: [birth year] DOA: 04/22/2017  PCP: [name], MD   Outpatient Specialists: Oncology, Dr. Burr Medico  Brief Narrative:  66 y.o. 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 Patient presented to [facility] long with worsening rash started on the day prior to this admission around the forehead and left side of the face. He was seen at primary care office and he was urgently sent to dermatologist who diagnosed the patient with disseminated shingles. He was advised to come to the ED because of immunosuppression any need for IV antiviral treatment. Patient did not have any pain over the area. No fevers or chills. He did have sharp stabbing left eye pain the day prior to this admission but that pain has resolved on the admission. Patient was hemodynamically stable in ED, started on IV acyclovir. Slit-lamp exam was normal. Oncology and ophthalmology have been consulted  Assessment & Plan:   Principal Problem:   Disseminated ophthalmic herpes zoster - Appreciate ophthalmology recommendations - Plan for outpatient ophtho exam to allow detailed slit lamp exam of anterior segment and dilated fundus exam.  Appt is made for Monday 04/26/17 @ 8:30a.m. - Continue IV acyclovir - Continue pain management efforts  Active Problems:   Multiple myeloma in remission ([place]) - Patient follows with Dr. 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    Leukopenia / thrombocytopenia - Likely secondary to multiple myeloma - White blood cell count 3.2 --> 3.3  - Platelet count 134 --> 95 --> 91, continue to monitor - CBC in AM    Hyponatremia  - mild, pre renal - resolved  - BMP In  AM    Hypokalemia  - supplemented and WNL this AM  - BMP in AM    Acute kidney injury - Likely due to acute infection - improving with IVF - BMP in AM    DVT prophylaxis: Lovenox subcutaneous; platelets are 134. Although slightly lower than the normal range will have a low threshold to stop Lovenox should platelet count dropped Code Status: Full code Family Communication: Patient at bedside  Disposition Plan: Not yet stable for discharge. Anticipate discharge in next 1-2 days if Dr. Burr Medico agrees   Consultants:   Ophthalmology  Oncology  Procedures:   None  Antimicrobials:   IV acyclovir   Subjective: No overnight events.  Objective: Vitals:   04/24/17 0442 04/24/17 1350 04/24/17 2134 04/25/17 0534  BP: (!) 120/59 127/73 120/63 (!) 105/46  Pulse: (!) 109 74 79 70  Resp: '18 16 16 18  ' Temp: 98 F (36.7 C) 97.5 F (36.4 C) 97.5 F (36.4 C) 98.9 F (37.2 C)  TempSrc: Oral Oral Oral Oral  SpO2: 99% 100% 100% 99%  Weight:      Height:        Intake/Output Summary (Last 24 hours) at 04/25/17 1112 Last data filed at 04/25/17 0534  Gross per 24 hour  Intake           1073.6 ml  Output                0 ml  Net           1073.6 ml   [employer]  04/22/17 1329  Weight: 68 kg (150 lb)    Examination:  General exam: Appears calm and comfortable  Respiratory system: Clear to auscultation. Respiratory effort normal. Cardiovascular system: S1 & S2 heard, RRR. No JVD, murmurs, rubs, gallops or clicks. No pedal edema. Gastrointestinal system: Abdomen is nondistended, soft and nontender. No organomegaly or masses felt. Normal bowel sounds heard. Central nervous system: Alert and oriented. No focal neurological deficits. Extremities: Symmetric 5 x 5 power. Skin: Patient with zoster rash around the left eye, also on trunk area, most lesions crusting  Psychiatry: Judgement and insight appear normal. Mood & affect appropriate.   Data Reviewed: I have personally  reviewed following labs and imaging studies  CBC:  Recent Labs Lab 04/22/17 1550 04/23/17 0453 04/24/17 0424 04/25/17 0418  WBC 3.6* 3.3* 3.2* 3.3*  NEUTROABS 2.6  --   --   --   HGB 16.8 16.0 16.3 16.0  HCT 49.1 45.9 47.1 46.5  MCV 104.9* 102.5* 102.4* 102.9*  PLT 122* 134* 95* 91*   Basic Metabolic Panel:  Recent Labs Lab 04/22/17 1550 04/23/17 0706 04/24/17 0424 04/25/17 0418  NA 134* 134* 133* 135  K 3.3* 3.4* 2.9* 3.5  CL 103 100* 103 108  CO2 23 24 21* 20*  GLUCOSE 95 110* 112* 98  BUN '6 8 11 11  ' CREATININE 1.34* 1.46* 1.39* 1.40*  CALCIUM 9.1 9.3 8.7* 8.5*  MG  --   --  1.9 1.9   Liver Function Tests:  Recent Labs Lab 04/22/17 1550 04/23/17 0706  AST 48* 46*  ALT 50 52  ALKPHOS 150* 153*  BILITOT 0.9 1.1  PROT 7.9 7.7  ALBUMIN 4.5 4.4   Radiology Studies: No results found.  Scheduled Meds: . aspirin  81 mg Oral QHS  . calcium carbonate  1,250 mg Oral QHS  . enoxaparin (LOVENOX) injection  40 mg Subcutaneous Q24H  . famotidine  20 mg Oral QHS  . sodium chloride flush  3 mL Intravenous Q12H   Continuous Infusions: . sodium chloride    . acyclovir Stopped (04/25/17 0700)    LOS: 3 days   Time spent: 20 minutes   Faye Ramsay, MD [facility] Pager [phone]  If 7PM-7AM, please contact night-coverage [email or web address] Password [email or web address] 04/25/2017, 11:12 AM

## 2017-04-25 NOTE — Progress Notes (Signed)
Pharmacy Antibiotic Note  Eric Dudley is a 66 y.o. male admitted on 04/22/2017 with hx of cancer. Dermatologist diagnosed pt with Shingles due to rash on face and chest, sent pt to ED for IV antiviral therapy given bone marrow suppression.  Pharmacy has been consulted for acyclovir dosing.   Today, 04/25/2017:  D3 full abx  CrCl remains stable  Scheduled for ophthalmology exam 4/30  Plan:  Continue acyclovir 10mg /kg (680mg ) IV q8h  Follow renal function  Height: 5\' 9"  (175.3 cm) Weight: 150 lb (68 kg) IBW/kg (Calculated) : 70.7  Temp (24hrs), Avg:98 F (36.7 C), Min:97.5 F (36.4 C), Max:98.9 F (37.2 C)   Recent Labs Lab 04/22/17 1550 04/23/17 0453 04/23/17 0706 04/24/17 0424 04/25/17 0418  WBC 3.6* 3.3*  --  3.2* 3.3*  CREATININE 1.34*  --  1.46* 1.39* 1.40*    Estimated Creatinine Clearance: 49.2 mL/min (A) (by C-G formula based on SCr of 1.4 mg/dL (H)).    Allergies  Allergen Reactions  . Hydrochlorothiazide Rash and Palpitations  . Asa [Aspirin] Nausea Only  . Calcium Carbonate Nausea And Vomiting  . Oxycodone Other (See Comments)    Patient does not want to take. Severe Hallucinations.  . Amoxicillin Itching and Rash  . Indapamide Palpitations    Lightheadedness, dizziness    Thank you for allowing pharmacy to be a part of this patient's care.  Reuel Boom, PharmD, BCPS Pager: [phone] 04/25/2017, 11:36 AM

## 2017-04-26 LAB — CBC
HEMATOCRIT: 44 % (ref 39.0–52.0)
Hemoglobin: 15.7 g/dL (ref 13.0–17.0)
MCH: 35.9 pg — AB (ref 26.0–34.0)
MCHC: 35.7 g/dL (ref 30.0–36.0)
MCV: 100.7 fL — AB (ref 78.0–100.0)
Platelets: 106 10*3/uL — ABNORMAL LOW (ref 150–400)
RBC: 4.37 MIL/uL (ref 4.22–5.81)
RDW: 13.1 % (ref 11.5–15.5)
WBC: 3.6 10*3/uL — ABNORMAL LOW (ref 4.0–10.5)

## 2017-04-26 LAB — BASIC METABOLIC PANEL
Anion gap: 8 (ref 5–15)
BUN: 10 mg/dL (ref 6–20)
CALCIUM: 8.9 mg/dL (ref 8.9–10.3)
CO2: 21 mmol/L — AB (ref 22–32)
CREATININE: 1.3 mg/dL — AB (ref 0.61–1.24)
Chloride: 107 mmol/L (ref 101–111)
GFR calc Af Amer: >60 mL/min (ref >60)
GFR calc non Af Amer: 55 mL/min — ABNORMAL LOW (ref >60)
GLUCOSE: 109 mg/dL — AB (ref 65–99)
Potassium: 3.1 mmol/L — ABNORMAL LOW (ref 3.5–5.1)
Sodium: 136 mmol/L (ref 135–145)

## 2017-04-26 MED ORDER — VALACYCLOVIR HCL 1 G PO TABS: 1000.0000 mg | ORAL_TABLET | Freq: Three times a day (TID) | ORAL | 0 refills | Status: DC

## 2017-04-26 MED ORDER — VALACYCLOVIR HCL 500 MG PO TABS
1000.0000 mg | ORAL_TABLET | Freq: Three times a day (TID) | ORAL | Status: DC
  Filled 2017-04-26: qty 2

## 2017-04-26 MED ORDER — POTASSIUM CHLORIDE CRYS ER 20 MEQ PO TBCR
40.0000 meq | EXTENDED_RELEASE_TABLET | Freq: Once | ORAL | Status: AC
  Administered 2017-04-26: 40 meq via ORAL
  Filled 2017-04-26: qty 2

## 2017-04-26 MED ORDER — ZOLPIDEM TARTRATE 5 MG PO TABS: 5.0000 mg | ORAL_TABLET | Freq: Every evening | ORAL | 0 refills | Status: AC | PRN

## 2017-04-26 NOTE — Progress Notes (Signed)
Patient discharged to home, all discharge medications and instructions reviewed and questions answered.  Patient declined wheelchair assistance to vehicle states will ambulate.  

## 2017-04-26 NOTE — Discharge Summary (Signed)
Physician Discharge Summary  Eric Dudley KHT:[id number] DOB: 04/10/1951 DOA: 04/22/2017  PCP: [name], MD  Admit date: 04/22/2017 Discharge date: 04/26/2017  Recommendations for Outpatient Follow-up:  1. Pt will need to follow up with PCP in 2-3 weeks post discharge 2. Please obtain BMP to evaluate electrolytes and kidney function, potassium level  3. Please also check CBC to evaluate Hg and Hct levels 4. Valacyclovir for 4 more days  5. 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 Patient presented to [facility] long with worsening rash started on the day prior to this admission around the forehead and left side of the face. He was seen at primary care office and he was urgently sent to dermatologist who diagnosed the patient with disseminated shingles. He was advised to come to the ED because of immunosuppression any need for IV antiviral treatment. Patient did not have any pain over the area. No fevers or chills. He did have sharp stabbing left eye pain the day prior to this admission but that pain has resolved on the admission. Patient was hemodynamically stable in ED, started on IV acyclovir. Slit-lamp exam was normal. Oncology and ophthalmology have been consulted  Assessment & Plan:   Principal Problem:   Disseminated ophthalmic herpes zoster - Appreciate ophthalmology recommendations - Plan for outpatient ophtho exam to allow detailed slit lamp exam of anterior segment and dilated fundus exam. Appt is made for Wed 5/2 at  1:15 pm - Continue valacyclovir to complete therapy on discharge  - Continue pain management efforts  Active Problems:   Multiple myeloma in remission ([place]) - Patient follows with Dr. 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    Leukopenia / thrombocytopenia - Likely secondary to multiple myeloma - White blood cell count 3.2 --> 3.3 --> 3.6 - Platelet count 134 --> 95 --> 91 --> 106, continue to monitor - CBC in AM    Hyponatremia  - mild, pre renal - resolved     Hypokalemia  - supplemented prior to discharge     Acute kidney injury - Likely due to acute infection - improving    DVT prophylaxis: Lovenox subcutaneous Code Status: Full code Family Communication: Patient at bedside  Disposition Plan: Home   Consultants:   Ophthalmology  Oncology  Procedures:   None  Antimicrobials:   IV acyclovir --> changed to Oral Valacyclovir to complete therapy on discharge   Discharge Exam: Vitals:   04/25/17 1415 04/26/17 0608  BP: 114/62 (!) 100/46  Pulse: 60 61  Resp: 17 18  Temp: 97.7 F (36.5 C) 97.5 F (36.4 C)   Vitals:   04/24/17 2134 04/25/17 0534 04/25/17 1415 04/26/17 0608  BP: 120/63 (!) 105/46 114/62 (!) 100/46  Pulse: 79 70 60 61  Resp: _0 Temp: 97.5 F (36.4 C) 98.9 F (37.2 C) 97.7 F (36.5 C) 97.5 F (36.4 C)  TempSrc: Oral Oral Oral Oral  SpO2: 100% 99% 100% 99%  Weight:  Height:        General: Pt is alert, follows commands appropriately, not in acute distress Cardiovascular: Regular rate and rhythm, S1/S2 +, no murmurs, no rubs, no gallops Respiratory: Clear to auscultation bilaterally, no wheezing, no crackles, no rhonchi Abdominal: Soft, non tender, non distended, bowel sounds +, no guarding  Discharge Instructions   Allergies as of 04/26/2017      Reactions   Hydrochlorothiazide Rash,  Palpitations   Asa [aspirin] Nausea Only   Calcium Carbonate Nausea And Vomiting   Oxycodone Other (See Comments)   Patient does not want to take. Severe Hallucinations.   Amoxicillin Itching, Rash   Indapamide Palpitations   Lightheadedness, dizziness      Medication List    TAKE these medications   aspirin 81 MG chewable tablet Chew 1 tablet (81 mg total) by mouth daily. What changed:  when to take this   calcium carbonate 600 MG Tabs tablet Commonly known as:  OS-CAL Take 1,200 mg by mouth at bedtime.   CENTRUM SILVER ULTRA MENS PO Take 1 tablet by mouth at bedtime.   diphenhydrAMINE 25 mg capsule Commonly known as:  BENADRYL Take 25 mg by mouth at bedtime as needed for sleep.   guaiFENesin 600 MG 12 hr tablet Commonly known as:  MUCINEX Take 1,200 mg by mouth.   lenalidomide 5 MG capsule Commonly known as:  REVLIMID Take Revlimid  5 mg by mouth daily  For  21 days ,  Rest  7 days.   nitroGLYCERIN 0.4 MG SL tablet Commonly known as:  NITROSTAT Place 0.4 mg under the tongue every 5 (five) minutes x 3 doses as needed for chest pain. Reported on 05/19/2016   ondansetron 4 MG tablet Commonly known as:  ZOFRAN TAKE 1 TABLET FOUR TIMES A DAY AS NEEDED FOR NAUSEA OR VOMITING   polyethylene glycol packet Commonly known as:  MIRALAX / GLYCOLAX Take 17 g by mouth daily as needed (constipation).   potassium chloride 10 MEQ CR capsule Commonly known as:  MICRO-K Take 20 mEq by mouth daily.   prochlorperazine 10 MG tablet Commonly known as:  COMPAZINE Take 10 mg by mouth at bedtime as needed for nausea.   ranitidine 150 MG tablet Commonly known as:  ZANTAC Take 2 tablets (300 mg total) by mouth at bedtime. What changed:  how much to take   valACYclovir 1000 MG tablet Commonly known as:  VALTREX Take 1 tablet (1,000 mg total) by mouth 3 (three) times daily.   Vitamin D3 1000 units Caps Take 2,000 mg by mouth daily.   Zoledronic Acid 4 MG/100ML IVPB Commonly  known as:  ZOMETA Inject 4 mg into the vein. EVERY 60 DAYS Notes to patient:  Per previous schedule   zolpidem 5 MG tablet Commonly known as:  AMBIEN Take 1 tablet (5 mg total) by mouth at bedtime as needed for sleep.        Follow-up Information    RAMACHANDRAN,AJITH, MD Follow up.   Specialty:  Internal Medicine Contact information: [address] [place] [address] [phone]        Katy Apo, MD Follow up on 04/28/2017.   Specialty:  Ophthalmology Why:  Appoinmtent scheduled for Wednesday 04/28/2017 at 1:15 pm Contact information: [place] [address] [phone]        Faye Ramsay, MD Follow up.   Specialty:  Internal Medicine Contact information: [address] [place] [address] [phone]  The results of significant diagnostics from this hospitalization (including imaging, microbiology, ancillary and laboratory) are listed below for reference.     Microbiology: No results found for this or any previous visit (from the past 240 hour(s)).   Labs: Basic Metabolic Panel:  Recent Labs Lab 04/22/17 1550 04/23/17 0706 04/24/17 0424 04/25/17 0418 04/26/17 0405  NA 134* 134* 133* 135 136  K 3.3* 3.4* 2.9* 3.5 3.1*  CL 103 100* 103 108 107  CO2 23 24 21* 20* 21*  GLUCOSE 95 110* 112* 98 109*  BUN _0 CREATININE 1.34* 1.46* 1.39* 1.40* 1.30*  CALCIUM 9.1 9.3 8.7* 8.5* 8.9  MG  --   --  1.9 1.9  --    Liver Function Tests:  Recent Labs Lab 04/22/17 1550 04/23/17 0706  AST 48* 46*  ALT 50 52  ALKPHOS 150* 153*  BILITOT 0.9 1.1  PROT 7.9 7.7  ALBUMIN 4.5 4.4   CBC:  Recent Labs Lab 04/22/17 1550 04/23/17 0453 04/24/17 0424 04/25/17 0418 04/26/17 0405  WBC 3.6* 3.3* 3.2* 3.3* 3.6*  NEUTROABS 2.6  --   --   --   --   HGB 16.8 16.0 16.3 16.0 15.7  HCT 49.1 45.9 47.1 46.5 44.0  MCV 104.9* 102.5* 102.4* 102.9* 100.7*  PLT 122* 134* 95* 91*  106*     SIGNED: Time coordinating discharge: 30 minutes  Faye Ramsay, MD  [facility] 04/26/2017, 12:55 PM Pager [phone]  If 7PM-7AM, please contact night-coverage [email or web address] Password [email or web address]

## 2017-04-26 NOTE — Discharge Instructions (Signed)
Shingles Shingles, which is also known as herpes zoster, is an infection that causes a painful skin rash and fluid-filled blisters. Shingles is not related to genital herpes, which is a sexually transmitted infection. Shingles only develops in people who:  Have had chickenpox.  Have received the chickenpox vaccine. (This is rare.)  What are the causes? Shingles is caused by varicella-zoster virus (VZV). This is the same virus that causes chickenpox. After exposure to VZV, the virus stays in the body in an inactive (dormant) state. Shingles develops if the virus reactivates. This can happen many years after the initial exposure to VZV. It is not known what causes this virus to reactivate. What increases the risk? People who have had chickenpox or received the chickenpox vaccine are at risk for shingles. Infection is more common in people who:  Are older than age 50.  Have a weakened defense (immune) system, such as those with HIV, AIDS, or cancer.  Are taking medicines that weaken the immune system, such as transplant medicines.  Are under great stress.  What are the signs or symptoms? Early symptoms of this condition include itching, tingling, and pain in an area on your skin. Pain may be described as burning, stabbing, or throbbing. A few days or weeks after symptoms start, a painful red rash appears, usually on one side of the body in a bandlike or beltlike pattern. The rash eventually turns into fluid-filled blisters that break open, scab over, and dry up in about 2-3 weeks. At any time during the infection, you may also develop:  A fever.  Chills.  A headache.  An upset stomach.  How is this diagnosed? This condition is diagnosed with a skin exam. Sometimes, skin or fluid samples are taken from the blisters before a diagnosis is made. These samples are examined under a microscope or sent to a lab for testing. How is this treated? There is no specific cure for this condition.  Your health care provider will probably prescribe medicines to help you manage pain, recover more quickly, and avoid long-term problems. Medicines may include:  Antiviral drugs.  Anti-inflammatory drugs.  Pain medicines.  If the area involved is on your face, you may be referred to a specialist, such as an eye doctor (ophthalmologist) or an ear, nose, and throat (ENT) doctor to help you avoid eye problems, chronic pain, or disability. Follow these instructions at home: Medicines  Take medicines only as directed by your health care provider.  Apply an anti-itch or numbing cream to the affected area as directed by your health care provider. Blister and Rash Care  Take a cool bath or apply cool compresses to the area of the rash or blisters as directed by your health care provider. This may help with pain and itching.  Keep your rash covered with a loose bandage (dressing). Wear loose-fitting clothing to help ease the pain of material rubbing against the rash.  Keep your rash and blisters clean with mild soap and cool water or as directed by your health care provider.  Check your rash every day for signs of infection. These include redness, swelling, and pain that lasts or increases.  Do not pick your blisters.  Do not scratch your rash. General instructions  Rest as directed by your health care provider.  Keep all follow-up visits as directed by your health care provider. This is important.  Until your blisters scab over, your infection can cause chickenpox in people who have never had it or been vaccinated   against it. To prevent this from happening, avoid contact with other people, especially: ? Babies. ? Pregnant women. ? Children who have eczema. ? Elderly people who have transplants. ? People who have chronic illnesses, such as leukemia or AIDS. Contact a health care provider if:  Your pain is not relieved with prescribed medicines.  Your pain does not get better after  the rash heals.  Your rash looks infected. Signs of infection include redness, swelling, and pain that lasts or increases. Get help right away if:  The rash is on your face or nose.  You have facial pain, pain around your eye area, or loss of feeling on one side of your face.  You have ear pain or you have ringing in your ear.  You have loss of taste.  Your condition gets worse. This information is not intended to replace advice given to you by your health care provider. Make sure you discuss any questions you have with your health care provider. Document Released: 12/14/2005 Document Revised: 08/09/2016 Document Reviewed: 10/25/2014 [employer] Interactive Patient Education  2017 [employer]  

## 2017-04-27 DIAGNOSIS — N200 Calculus of kidney: Secondary | ICD-10-CM | POA: Diagnosis not present

## 2017-04-27 DIAGNOSIS — R3911 Hesitancy of micturition: Secondary | ICD-10-CM | POA: Diagnosis not present

## 2017-04-28 DIAGNOSIS — H04122 Dry eye syndrome of left lacrimal gland: Secondary | ICD-10-CM | POA: Diagnosis not present

## 2017-05-12 NOTE — Progress Notes (Signed)
[place]  [phone] [phone]  Clinic Follow Up Note   Patient Care Team: [name], MD as PCP - General (Internal Medicine) Debara Pickett Nadean Corwin, MD as Consulting Physician (Cardiology) Truitt Merle, MD as Consulting Physician (Hematology) 05/14/2017   CHIEF COMPLAINTS: Follow-up multiple myeloma    Multiple myeloma in remission ([place])   01/31/2015 Tumor Marker    SPEP showed M-SPIKE 1.7g/dl, Cr 1.5, no anemia, hypercalcemia      03/19/2015 Imaging    Bone survey showed no discrete lytic lesion, but there is osteopenia and calvarial heterogeneity.      03/27/2015 Initial Diagnosis    Multiple myeloma, stage I      03/27/2015 Bone Marrow Biopsy    Bone Marrow, Aspirate,Biopsy: - HYPERCELLULAR BONE MARROW FOR AGE WITH PLASMA CELL NEOPLASM 47% - SEE COMMENT. PERIPHERAL BLOOD: - NO SIGNIFICANT MORPHOLOGIC ABNORMALITIES. Diagnosis Note The bone marrow shows increased number of       03/27/2015 Miscellaneous    bone marrow Cytogenetics: normal. FISH panel showed the presence of +4 and +11, this is consider standard risk of MM       04/16/2015 - 04/23/2015 Hospital Admission    He was admitted for worsening back pain, was found to have a T12 pathological fracture. He underwent kyphoplasty on 04/19/2015.      04/17/2015 Imaging    MR Thoracic and Lumbar Spine w/o Contrast 04/17/2015 IMPRESSION: MR THORACIC SPINE IMPRESSION: Pathologic compression fracture at T8 in this patient with widespread multiple myeloma. Slight retropulsion of abnormal bone without conus compression. MR LUMBAR SPINE IMPRESSION: Focal myeloma deposits at L3 and T9 without significant compression deformity. Mild stenosis at L4-5 related to ordinary spondylosis.      04/19/2015 - 08/02/2015 Chemotherapy    RVD with weekly Velcade 1.3 mg/m, dexamethasone 40 mg, and Revlimid 10 mg daily on day 1-21, every 28 days, cycle 1 Revlimid was interupted by hospitalization,  cycle 3 Revlimid dose increased to 25 mg daily due to his improved renal function. total 4 cyc      04/26/2015 - 05/01/2015 Hospital Admission    He was admitted to [facility] for syncope episode during his radiation simulation. EKG showed ST elevation, troponin was positive, he underwent cardio catheterization which showed mild stenosis. No intervention was needed. echo showed EF 35-40%      04/29/2015 - 05/10/2015 Radiation Therapy    palliative RT to T12, 20Gy in 10 fractions      08/19/2015 Bone Marrow Transplant    He underwent autologous stem cell transplant at [facility].       09/30/2015 Imaging    CT anginal chest 09/30/2015 IMPRESSION: 1. No pulmonary embolism. 2. No thoracic aortic aneurysm or dissection. 3. Heart size is normal. No pericardial effusion. 4. Mild scarring/atelectasis at each lung base, probably chronic. Lungs otherwise clear. No evidence of pneumonia. No pleural effusion. 5. Evidence of diffuse multiple myeloma involvement throughout the thoracolumbar spine and multiple ribs bilaterally, with areas of present clinical relevance described below. 6. Compression fracture deformity of the T12 vertebral body, known pathologic fracture, status post treatment with vertebroplasty on 04/19/2015. 7. Milder compression deformity of the overlying T11 vertebral body, approximately 40% compressed anteriorly, also presumably pathologic. This vertebral body appeared essentially normal on fluoroscopic images from the earlier aforementioned vertebroplasty of 04/19/2015. 8. Slight irregularity of the right lateral sixth rib, with subtle cortical sclerosis suggesting nondisplaced healing fracture. This may be a relatively acute fracture and is probably pathologic in nature  related to underlying multiple myeloma. 9. Similar irregularity of the left lateral fifth rib, suggesting minimally displaced healing fracture. This may also be a relatively acute fracture and  is likely pathologic in nature related to underlying multiple myeloma. These acute or subacute rib fractures are the most likely source of patient's bilateral chest pain. Additional incidental findings in the upper abdomen: Fatty infiltration of liver, cholelithiasis without evidence of acute cholecystitis, fairly large amount of stool within the nondistended colon of the upper abdomen (constipation?).      11/27/2015 Bone Marrow Biopsy    hypocellular marrow (10%), no increased plasma cells       12/29/2015 -  Chemotherapy    maintenance Revlimid 26m daily, 3 weeks on, one week off   Held for Hospital stay and will restart 05/22/17       04/22/2017 - 04/26/2017 Hospital Admission    Admit date: 04/22/17-04/26/17 Admission diagnosis: Shingles Herpes Zoster Additional comments: Held Revlimid during hospital stay and healing        HISTORY OF PRESENTING ILLNESS (03/15/2015):  Eric Gitelman66y.o. 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  He has been having low back pain for 3-4 months. He had food posinin 4 month ago, and had frequent diarrhea. He started noticed low back pain since then. The pain is not radiating to leg, he also has intermittent right rib pain, worse with cough and sneezing. He remains to be physically active, able to do all the activities, such as gardening work housework without much limitation, but he doesn't sings slowly because of back pain. He takes Tylenol as needed, does not like the necrotic pain medication. No night sweats, no fever or chillss, no weight loss.   He was seen by his primary care physician Dr. RMerrilee Seashore MD and had lab test (see below). He also had bone density scan 2/25 which showed osteoporosis, has not been treated yet.  Current therapy:  Maintenance Revlimid 5 mg daily, 3 weeks on, one-week off, started on  12/29/2015 Zometa injections every 3 months started 06/26/16 Revlimid Held for 4 weeks due to shingles episode, will restart week of 05/22/17  INTERIM HISTORY:  Eric Dudley for follow-up. He presents to the clinic today post shingles. He still have residual shingles around left eye. He says it itches inside and uses eye drops and tylenol for pain. He reports his shingles rash on his body is healing. He was off his infusions for 3 weeks. Overall he is doing much better.   MEDICAL HISTORY:  Past Medical History:  Diagnosis Date  . Cholelithiases   . Chronic kidney disease    MILD,CHRONIC  . Dyslipidemia   . Hyperlipidemia   . Hypertension   . [place]    SURGICAL HISTORY: Past Surgical History:  Procedure Laterality Date  . APPENDECTOMY  1962  . CARDIAC CATHETERIZATION N/A 04/30/2015   Procedure: Left Heart Cath and Coronary Angiography;  Surgeon: Peter M JMartinique MD;  [facility];  Service: Cardiovascular;  Laterality: N/A;  . CARDIOLITE MYOCARDIAL PERFUSION STUDY  04/16/03   NEGITIVE BRUCE PROTOCAL EXERCISE STRESS TEST. EF 70%. NO ISCHEMIA.  .Marland KitchenKNEE SURGERY  2012   right  . SHOULDER SURGERY  1999   left    SOCIAL HISTORY: History   Social History  . Marital Status: Married  Spouse Name: N/A  . Number of Children: 1   . Years of Education: N/A   Occupational History  . A retired Therapist, occupational    Social History Main Topics  . Smoking status: Former Smoker    Types: Cigars    Quit date: 12/29/2003  . Smokeless tobacco: Former Systems developer    Types: Chew    Quit date: 12/28/1994  . Alcohol Use: Yes     Comment: occasional beer  . Drug Use: No  . Sexual Activity: Not on file   Other Topics Concern  . Not on file   Social History Narrative  . No narrative on file    FAMILY HISTORY: Family History  Problem Relation Age of Onset  . Cancer Mother 79       cervical cancer   . Cancer Father        unknown cancer   .  Hypertension Sister   . Cancer Sister        melanoma     ALLERGIES:  is allergic to hydrochlorothiazide; asa [aspirin]; calcium carbonate; oxycodone; amoxicillin; and indapamide.  MEDICATIONS:  Current Outpatient Prescriptions  Medication Sig Dispense Refill  . aspirin 81 MG chewable tablet Chew 1 tablet (81 mg total) by mouth daily. (Patient taking differently: Chew 81 mg by mouth at bedtime. )    . calcium carbonate (OS-CAL) 600 MG TABS tablet Take 1,200 mg by mouth at bedtime.     . Cholecalciferol (VITAMIN D3) 1000 units CAPS Take 2,000 mg by mouth daily.    . diphenhydrAMINE (BENADRYL) 25 mg capsule Take 25 mg by mouth at bedtime as needed for sleep.     [name] guaiFENesin (MUCINEX) 600 MG 12 hr tablet Take 1,200 mg by mouth.    . Multiple Vitamins-Minerals (CENTRUM SILVER ULTRA MENS PO) Take 1 tablet by mouth at bedtime.     . nitroGLYCERIN (NITROSTAT) 0.4 MG SL tablet Place 0.4 mg under the tongue every 5 (five) minutes x 3 doses as needed for chest pain. Reported on 05/19/2016    . ondansetron (ZOFRAN) 4 MG tablet TAKE 1 TABLET FOUR TIMES A DAY AS NEEDED FOR NAUSEA OR VOMITING 30 tablet 2  . polyethylene glycol (MIRALAX / GLYCOLAX) packet Take 17 g by mouth daily as needed (constipation).     . potassium chloride (MICRO-K) 10 MEQ CR capsule Take 20 mEq by mouth daily.    . prochlorperazine (COMPAZINE) 10 MG tablet Take 10 mg by mouth at bedtime as needed for nausea.     . ranitidine (ZANTAC) 150 MG tablet Take 2 tablets (300 mg total) by mouth at bedtime. (Patient taking differently: Take 150 mg by mouth at bedtime. ) 60 tablet 1  . Zoledronic Acid (ZOMETA) 4 MG/100ML IVPB Inject 4 mg into the vein. EVERY 60 DAYS    . zolpidem (AMBIEN) 5 MG tablet Take 1 tablet (5 mg total) by mouth at bedtime as needed for sleep. 30 tablet 0  . lenalidomide (REVLIMID) 5 MG capsule Take Revlimid  5 mg by mouth daily  For  21 days ,  Rest  7 days. 21 capsule 2  . valACYclovir (VALTREX) 1000 MG tablet Take  1 tablet (1,000 mg total) by mouth daily. 60 tablet 2   No current facility-administered medications for this visit.     REVIEW OF SYSTEMS:   Constitutional: Denies fevers, chills or abnormal night sweats Eyes: Denies blurriness of vision, double vision or watery eyes Ears, nose, mouth, throat, and face: Denies mucositis or  sore throat Respiratory: Denies cough, dyspnea or wheezes Cardiovascular: Denies palpitation, chest discomfort or lower extremity swelling Gastrointestinal:  Denies nausea, heartburn or change in bowel habits Skin: (+) rash from shingles is healing Lymphatics: Denies new lymphadenopathy or easy bruising Neurological:Denies numbness, tingling or new weaknesses Behavioral/Psych: Mood is stable, no new changes  Musculoskeletal: Positive for back pain and intermittent rib pain All other systems were reviewed with the patient and are negative.  PHYSICAL EXAMINATION:  ECOG PERFORMANCE STATUS: 1 BP (!) 138/49 (BP Location: Left Arm, Patient Position: Sitting)   Pulse (!) 53   Temp 97.8 F (36.6 C) (Oral)   Resp 17   Ht _0  (1.753 m)   Wt 152 lb 3.2 oz (69 kg)   SpO2 100%   BMI 22.48 kg/m   GENERAL: well-appearing, no distress SKIN: skin color, texture, turgor are normal, no rashes or significant lesions except to some healing rash on his left upper face, his abdomen due to shingles EYES: normal, conjunctiva are pink and non-injected, sclera clear OROPHARYNX:no exudate, no erythema and lips, buccal mucosa, and tongue normal  NECK: supple, thyroid normal size, non-tender, without nodularity LYMPH:  no palpable lymphadenopathy in the cervical, axillary or inguinal LUNGS: clear to auscultation and percussion with normal breathing effort HEART: regular rate & rhythm and no murmurs and no lower extremity edema ABDOMEN:abdomen soft, non-tender and normal bowel sounds Musculoskeletal:no cyanosis of digits and no clubbing, mild tenderness on bilateral lateral chest wall.   PSYCH: alert & oriented x 3 with fluent speech NEURO: no focal motor/sensory deficits CHEST WALL: slightly enlarged left breast, mild tenderness, no palpable mass. He states his left breast is slightly bigger than right for all his adult life.  LABORATORY DATA:   CBC Latest Ref Rng & Units 05/14/2017 04/26/2017 04/25/2017  WBC 4.0 - 10.3 10e3/uL 3.1(L) 3.6(L) 3.3(L)  Hemoglobin 13.0 - 17.1 g/dL 16.2 15.7 16.0  Hematocrit 38.4 - 49.9 % 48.0 44.0 46.5  Platelets 140 - 400 10e3/uL 202 106(L) 91(L)   CMP Latest Ref Rng & Units 05/14/2017 04/26/2017 04/25/2017  Glucose 70 - 140 mg/dl 83 109(H) 98  BUN 7.0 - 26.0 mg/dL 8._1 Creatinine 0.7 - 1.3 mg/dL 1.2 1.30(H) 1.40(H)  Sodium 136 - 145 mEq/L 139 136 135  Potassium 3.5 - 5.1 mEq/L 4.1 3.1(L) 3.5  Chloride 101 - 111 mmol/L - 107 108  CO2 22 - 29 mEq/L 21(L) 21(L) 20(L)  Calcium 8.4 - 10.4 mg/dL 9.6 8.9 8.5(L)  Total Protein 6.4 - 8.3 g/dL 7.0 - -  Total Bilirubin 0.20 - 1.20 mg/dL 0.46 - -  Alkaline Phos 40 - 150 U/L 131 - -  AST 5 - 34 U/L 30 - -  ALT 0 - 55 U/L 35 - -   MM lab: SPEP M-protein (g/dl) 03/15/2015: 1.6 05/24/2015: 0.5 06/21/2015: 0.4 07/18/2015 (at [facility]): 0.33 11/27/2015: not det 01/21/2016: not det  03/24/2016: not det 05/19/2016: not det  08/07/2016: not det  10/30/2017: not det 03/18/2017: Not det  Serum IgG (mg/dl) 03/15/2015: 2000 05/24/2015: 579 06/21/2015: 673 07/18/2015 ([facility]): 509 11/27/2015: 396 01/21/2016: 487 03/24/2016: 563 05/19/2016: 600 08/07/2016: 530 10/30/2016: 589 03/18/2017: 584  Serum kappa/lamda/ratio (mg/dl)  03/15/2015: 690, 0.13, 5308 05/24/2015: 146, 1.27, 115.0 06/21/2015: 86, 0.74, 116.2 01/21/2016: 1.09, 1.21, 0.9 03/24/2016: 1.72, 1.49, 1.16 05/19/2016: 1.91, 1.50, 1.27 08/07/2016: 1.36, 1.41, 0.96  10/30/2016: 1.6, 1.32, 1.21  2/33/2018: 1.67, 1.55, 1.08  24 h urine kappa light chain (mg) 03/18/2015: 2434 05/28/2015: 582 06/24/2015: 322  04/27/2016: 499    PATHOLOGY REPORT Bone  Marrow, Aspirate,Biopsy, and Clot, right iliac 03/27/2015 - HYPERCELLULAR BONE MARROW FOR AGE WITH PLASMA CELL NEOPLASM. - SEE COMMENT. PERIPHERAL BLOOD: - NO SIGNIFICANT MORPHOLOGIC ABNORMALITIES. Diagnosis Note The bone marrow shows increased number of atypical plasma cells representing 47% of all cells in the aspirate associated with prominent interstitial infiltrates and variably sized aggregates in the clot and biopsy sections. Immunohistochemical stains show that the plasma cells are kappa light chain restricted consistent with plasma cell neoplasm. The background shows trilineage  Cytogenetics: normal FISH panel: +4 and +11  Bone Marrow, Aspirate,Biopsy, and Clot, 11/27/2015 at [place]: [id number] RECEIVED: 11/27/2015 ORDERING PHYSICIAN: CESAR Melburn Hake , MD PATIENT NAME: Eric Dudley BONE MARROW REPORT   Bone Marrow (BM) and Peripheral Blood (PB) FINAL PATHOLOGIC DIAGNOSIS  BONE MARROW: Hypocellular marrow (10%) with no increase in plasma cells. See comment.  PERIPHERAL BLOOD: Absolute lymphopenia. See CBC data.  COMMENT: The touch prep is cellular and adequate for evaluation. Rare plasma cells are present (<1%). Blasts are not increased, Myeloid and erythroid cells are present in normal proportions with intact maturation and no overt dysplastic changes. Megakaryocytes are absent. The aspirate smears are aspicular and hemodilute.  Bone marrow and clot sections are variably hypocellular (0-20% range, 10% overall). Scattered interstitial plasma cells are present and comprise <5% of marrow cellularity by CD138 immunohistochemical analysis. Blasts are not increased. Myeloid and erythroid cells are present in normal proportions and have no overt morphologic abnormalities. Megakaryocytes are morphologically within normal limits.  Examination of the peripheral blood reveals absolute lymphopenia. There are no circulating plasma cells or  evidence of rouleaux formation.   RADIOGRAPHIC STUDIES: No new scans   ASSESSMENT & PLAN:  66 y.o. Caucasian male with past medical history of hypertension, tonsil cancer status post surgery and radiation 10 years ago, now presented with 3-4 months low back pain and intermittent rib pain. His blood test showed M protein 1.7g/dl.  1. IgG multiple myeloma, stage I, standard risk by cytogenetics (+4 and +11), in remission  -I previously discussed his initial bone marrow biopsy results with him. He has significant amount of plasma cells in the bone marrow 47%.  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 Based on the new notable myeloma diagnostic criteria, he meets the criteria of multiple myeloma. He has normal albumin and miildly elevated beta 2 microglobulin, this is stage I. -His cytogenetics and Fish panel revealed standard risk. -His thoracic and lumbar spine MRI showed pathologic compression fracture at T12, focal myeloma deposits at L3 and T9, diffuse marrow signal abnormality consistent with myeloma -I previously recommended induction chemotherapy with VRD regimen, weekly Velcade and dexamethasone, first 2 cycles Revlimid dose reduced to 10 mg daily 3 week on, one-week off, based on his renal function, and it which was increased to full dose from cycle 3. He received a total of 4 cycles of treatment, achieved a very good partial response. Repeat bone marrow on 07/18/2015 showed 5% plasma cells, fish and cytogenetics was normal. -His post transplant evaluation previously showed complete remission (<1% plasma cell in marrow, undetectable M protein), and he has recovered very well. -He previously started maintenance Revlimid 5 mg daily, 3 weeks  on, 1 week off, tolerating well,  we'll continue. -His Revlimid was held to 3 weeks ago due to disseminated herpes zoster infection. He has now recovered well. -He will start next cycle in one week (5/26) -He is doing well, will monitoring lab every 2 months  - previously Discussed possibility of Revlimid may iIncreasing risk of secondary cancer occurring. Colonoscopy in 2 years, he does PSA screening at [facility]  - Scheduled for zometa today, continue every 3 months, next due in June  -I will refill Revlimid -He will be due for Zometa injection in June at next visit -We'll restart acyclovir for herpes suppression   2. Disseminated Shingles in 03/2017 -He was admitted to the hospital for Shingles on 4/26-4/30 -This flared due to compromised immune system while on Revlimid -We will restart acyclovir 1082m daily for prophylaxis  3. T12 pathologic compression fracture, and diffuse myeloma involvement in bones -Status post kyphoplasty and palliative radiation  -He previously received palliative Radiation, pain much improved overall. He does complain more mid back and right-sided chest pain daily, likely related to his physical activities. -continue zometa infusion every 3 months, he has been seeing his dentist regularly.   4. nonobstructive CAD, STEMI, Non ischemic CM, Takatsubo, with EF of 35-40% -Patient was initially found to have troponin elevation and anterior apical ST elevation MI and resultant cardiomyopathy on 04/26/15 -He previously underwent a cardiac catheterization on 5/3 which showed mild diffuse disease, no stents were placed. He is on aspirin -follow up with Dr. HDebara Pickett -He has recovered well. Repeat echo before transplant showed EF 55-60% in 11/2015   5. Peripheral neuropathy, grade 1 -Likely secondary to his bone marrow transplant chemotherapy -Mild and Stable overall, we'll continue observation  6. Advanced directives -his advanced directives is in EPIC -he is full  code.   Plan -refill Revlimid and order acyclovir today -Restart Revlimid on 05/22/2017 -Lab and f/u and zometa infusion in 6 weeks    All questions were answered. The patient knows to call the clinic with any problems, questions or concerns.  I spent 20 minutes counseling the patient face to face. The total time spent in the appointment was 30 minutes and more than 50% was on counseling.     FTruitt Merle MD 05/14/2017     This document serves as a record of services personally performed by YTruitt Merle MD. It was created on her behalf by AJoslyn Devon a trained medical scribe. The creation of this record is based on the scribe's personal observations and the provider's statements to them. This document has been checked and approved by the attending provider.

## 2017-05-14 ENCOUNTER — Telehealth: Payer: Self-pay | Admitting: Hematology

## 2017-05-14 ENCOUNTER — Encounter: Payer: Self-pay | Admitting: Hematology

## 2017-05-14 ENCOUNTER — Other Ambulatory Visit (HOSPITAL_BASED_OUTPATIENT_CLINIC_OR_DEPARTMENT_OTHER): Payer: Medicare Other

## 2017-05-14 ENCOUNTER — Ambulatory Visit (HOSPITAL_BASED_OUTPATIENT_CLINIC_OR_DEPARTMENT_OTHER): Payer: Medicare Other | Admitting: Hematology

## 2017-05-14 VITALS — BP 138/49 | HR 53 | Temp 97.8°F | Resp 17 | Ht 69.0 in | Wt 152.2 lb

## 2017-05-14 DIAGNOSIS — M545 Low back pain: Secondary | ICD-10-CM | POA: Diagnosis not present

## 2017-05-14 DIAGNOSIS — C9001 Multiple myeloma in remission: Secondary | ICD-10-CM

## 2017-05-14 DIAGNOSIS — I428 Other cardiomyopathies: Secondary | ICD-10-CM

## 2017-05-14 DIAGNOSIS — I10 Essential (primary) hypertension: Secondary | ICD-10-CM

## 2017-05-14 DIAGNOSIS — G620 Drug-induced polyneuropathy: Secondary | ICD-10-CM

## 2017-05-14 LAB — CBC WITH DIFFERENTIAL/PLATELET
BASO%: 0.5 % (ref 0.0–2.0)
BASOS ABS: 0 10*3/uL (ref 0.0–0.1)
EOS ABS: 0 10*3/uL (ref 0.0–0.5)
EOS%: 1.5 % (ref 0.0–7.0)
HEMATOCRIT: 48 % (ref 38.4–49.9)
HEMOGLOBIN: 16.2 g/dL (ref 13.0–17.1)
LYMPH%: 23.1 % (ref 14.0–49.0)
MCH: 36.4 pg — ABNORMAL HIGH (ref 27.2–33.4)
MCHC: 33.8 g/dL (ref 32.0–36.0)
MCV: 107.9 fL — ABNORMAL HIGH (ref 79.3–98.0)
MONO#: 0.5 10*3/uL (ref 0.1–0.9)
MONO%: 15.6 % — ABNORMAL HIGH (ref 0.0–14.0)
NEUT%: 59.3 % (ref 39.0–75.0)
NEUTROS ABS: 1.8 10*3/uL (ref 1.5–6.5)
PLATELETS: 202 10*3/uL (ref 140–400)
RBC: 4.44 10*6/uL (ref 4.20–5.82)
RDW: 15.5 % — AB (ref 11.0–14.6)
WBC: 3.1 10*3/uL — AB (ref 4.0–10.3)
lymph#: 0.7 10*3/uL — ABNORMAL LOW (ref 0.9–3.3)

## 2017-05-14 LAB — COMPREHENSIVE METABOLIC PANEL
ALT: 35 U/L (ref 0–55)
ANION GAP: 8 meq/L (ref 3–11)
AST: 30 U/L (ref 5–34)
Albumin: 3.9 g/dL (ref 3.5–5.0)
Alkaline Phosphatase: 131 U/L (ref 40–150)
BUN: 8.7 mg/dL (ref 7.0–26.0)
CALCIUM: 9.6 mg/dL (ref 8.4–10.4)
CHLORIDE: 110 meq/L — AB (ref 98–109)
CO2: 21 meq/L — AB (ref 22–29)
Creatinine: 1.2 mg/dL (ref 0.7–1.3)
EGFR: 59 mL/min/{1.73_m2} — ABNORMAL LOW (ref >90)
Glucose: 83 mg/dl (ref 70–140)
POTASSIUM: 4.1 meq/L (ref 3.5–5.1)
Sodium: 139 mEq/L (ref 136–145)
Total Bilirubin: 0.46 mg/dL (ref 0.20–1.20)
Total Protein: 7 g/dL (ref 6.4–8.3)

## 2017-05-14 MED ORDER — LENALIDOMIDE 5 MG PO CAPS: ORAL_CAPSULE | ORAL | 2 refills | Status: DC

## 2017-05-14 MED ORDER — VALACYCLOVIR HCL 1 G PO TABS: 1000.0000 mg | ORAL_TABLET | Freq: Every day | ORAL | 2 refills | Status: DC

## 2017-05-14 NOTE — Telephone Encounter (Signed)
Gave patient AVS and calender per 5/18 los. Lab, follow up and zometa in 6 weeks.

## 2017-05-21 ENCOUNTER — Telehealth: Payer: Self-pay | Admitting: *Deleted

## 2017-05-21 ENCOUNTER — Other Ambulatory Visit: Payer: Self-pay | Admitting: *Deleted

## 2017-05-21 DIAGNOSIS — C9001 Multiple myeloma in remission: Secondary | ICD-10-CM

## 2017-05-21 MED ORDER — LENALIDOMIDE 5 MG PO CAPS: ORAL_CAPSULE | ORAL | 2 refills | Status: DC

## 2017-05-21 NOTE — Telephone Encounter (Signed)
Called [employer] after obtaining new auth #.

## 2017-05-21 NOTE — Telephone Encounter (Signed)
[facility] representative called requesting a new authorization # for recently sent Revlimid prescription. Call back # is [id number] Reference # [id number]

## 2017-05-31 DIAGNOSIS — E782 Mixed hyperlipidemia: Secondary | ICD-10-CM | POA: Diagnosis not present

## 2017-05-31 DIAGNOSIS — E538 Deficiency of other specified B group vitamins: Secondary | ICD-10-CM | POA: Diagnosis not present

## 2017-05-31 DIAGNOSIS — N183 Chronic kidney disease, stage 3 (moderate): Secondary | ICD-10-CM | POA: Diagnosis not present

## 2017-05-31 DIAGNOSIS — I10 Essential (primary) hypertension: Secondary | ICD-10-CM | POA: Diagnosis not present

## 2017-06-07 DIAGNOSIS — N183 Chronic kidney disease, stage 3 (moderate): Secondary | ICD-10-CM | POA: Diagnosis not present

## 2017-06-07 DIAGNOSIS — E782 Mixed hyperlipidemia: Secondary | ICD-10-CM | POA: Diagnosis not present

## 2017-06-13 ENCOUNTER — Other Ambulatory Visit: Payer: Self-pay | Admitting: Hematology

## 2017-06-13 DIAGNOSIS — C9001 Multiple myeloma in remission: Secondary | ICD-10-CM

## 2017-06-16 ENCOUNTER — Other Ambulatory Visit: Payer: Self-pay

## 2017-06-16 DIAGNOSIS — C9001 Multiple myeloma in remission: Secondary | ICD-10-CM

## 2017-06-16 MED ORDER — LENALIDOMIDE 5 MG PO CAPS: ORAL_CAPSULE | ORAL | 2 refills | Status: DC

## 2017-06-22 ENCOUNTER — Other Ambulatory Visit: Payer: Self-pay | Admitting: *Deleted

## 2017-06-22 DIAGNOSIS — C9001 Multiple myeloma in remission: Secondary | ICD-10-CM

## 2017-06-22 MED ORDER — LENALIDOMIDE 5 MG PO CAPS: ORAL_CAPSULE | ORAL | 0 refills | Status: DC

## 2017-06-22 MED ORDER — LENALIDOMIDE 5 MG PO CAPS
ORAL_CAPSULE | ORAL | 0 refills | Status: DC
Start: 2017-06-22 — End: 2017-06-22

## 2017-06-25 ENCOUNTER — Telehealth: Payer: Self-pay | Admitting: Nurse Practitioner

## 2017-06-25 ENCOUNTER — Ambulatory Visit (HOSPITAL_BASED_OUTPATIENT_CLINIC_OR_DEPARTMENT_OTHER): Payer: Medicare Other

## 2017-06-25 ENCOUNTER — Ambulatory Visit (HOSPITAL_BASED_OUTPATIENT_CLINIC_OR_DEPARTMENT_OTHER): Payer: Medicare Other | Admitting: Nurse Practitioner

## 2017-06-25 ENCOUNTER — Other Ambulatory Visit (HOSPITAL_BASED_OUTPATIENT_CLINIC_OR_DEPARTMENT_OTHER): Payer: Medicare Other

## 2017-06-25 VITALS — BP 113/52 | HR 42 | Temp 97.8°F | Resp 20 | Ht 69.0 in | Wt 155.9 lb

## 2017-06-25 DIAGNOSIS — C9001 Multiple myeloma in remission: Secondary | ICD-10-CM | POA: Diagnosis not present

## 2017-06-25 DIAGNOSIS — G620 Drug-induced polyneuropathy: Secondary | ICD-10-CM

## 2017-06-25 DIAGNOSIS — R001 Bradycardia, unspecified: Secondary | ICD-10-CM

## 2017-06-25 LAB — CBC WITH DIFFERENTIAL/PLATELET
BASO%: 0.4 % (ref 0.0–2.0)
Basophils Absolute: 0 10*3/uL (ref 0.0–0.1)
EOS%: 0.5 % (ref 0.0–7.0)
Eosinophils Absolute: 0 10*3/uL (ref 0.0–0.5)
HCT: 47.1 % (ref 38.4–49.9)
HGB: 15.9 g/dL (ref 13.0–17.1)
LYMPH%: 17.1 % (ref 14.0–49.0)
MCH: 36.8 pg — ABNORMAL HIGH (ref 27.2–33.4)
MCHC: 33.7 g/dL (ref 32.0–36.0)
MCV: 109.3 fL — ABNORMAL HIGH (ref 79.3–98.0)
MONO#: 0.7 10*3/uL (ref 0.1–0.9)
MONO%: 17.3 % — AB (ref 0.0–14.0)
NEUT#: 2.8 10*3/uL (ref 1.5–6.5)
NEUT%: 64.7 % (ref 39.0–75.0)
Platelets: 224 10*3/uL (ref 140–400)
RBC: 4.31 10*6/uL (ref 4.20–5.82)
RDW: 16.9 % — ABNORMAL HIGH (ref 11.0–14.6)
WBC: 4.3 10*3/uL (ref 4.0–10.3)
lymph#: 0.7 10*3/uL — ABNORMAL LOW (ref 0.9–3.3)

## 2017-06-25 LAB — COMPREHENSIVE METABOLIC PANEL
ALT: 34 U/L (ref 0–55)
AST: 25 U/L (ref 5–34)
Albumin: 3.9 g/dL (ref 3.5–5.0)
Alkaline Phosphatase: 130 U/L (ref 40–150)
Anion Gap: 9 mEq/L (ref 3–11)
BUN: 10.9 mg/dL (ref 7.0–26.0)
CHLORIDE: 109 meq/L (ref 98–109)
CO2: 21 meq/L — AB (ref 22–29)
CREATININE: 1.2 mg/dL (ref 0.7–1.3)
Calcium: 9.7 mg/dL (ref 8.4–10.4)
EGFR: 59 mL/min/{1.73_m2} — ABNORMAL LOW (ref >90)
GLUCOSE: 94 mg/dL (ref 70–140)
Potassium: 3.5 mEq/L (ref 3.5–5.1)
SODIUM: 140 meq/L (ref 136–145)
Total Bilirubin: 0.66 mg/dL (ref 0.20–1.20)
Total Protein: 6.9 g/dL (ref 6.4–8.3)

## 2017-06-25 MED ORDER — ZOLEDRONIC ACID 4 MG/100ML IV SOLN
4.0000 mg | Freq: Once | INTRAVENOUS | Status: AC
  Administered 2017-06-25: 4 mg via INTRAVENOUS
  Filled 2017-06-25: qty 100

## 2017-06-25 NOTE — Patient Instructions (Signed)

## 2017-06-25 NOTE — Progress Notes (Signed)
  [facility] OFFICE PROGRESS NOTE   Diagnosis:  Multiple myeloma Current therapy:  Maintenance Revlimid 5 mg daily, 3 weeks on, one-week off, started on 12/29/2015 Zometa injections every 3 months started 06/26/16 Revlimid Held for 4 weeks due to shingles episode, will restart week of 05/22/17 INTERVAL HISTORY:   Mr. Gunderman returns as scheduled. He overall feels well. He resumed Revlimid following the last office visit. He denies nausea. No mouth sores. No diarrhea. Neuropathy are better since beginning B12. Good appetite. No pain. Stable dyspnea on exertion. Continues to note lightheadedness when he first stands. This is a long standing occurence. No syncopal episodes. No chest pain.   Objective:  Vital signs in last 24 hours:  Blood pressure (!) 113/52, pulse (!) 42, temperature 97.8 F (36.6 C), temperature source Oral, resp. rate 20, height '5\' 9"'$  (1.753 m), weight 155 lb 14.4 oz (70.7 kg), SpO2 100 %.    HEENT: No thrush or ulcers. Resp: Lungs clear bilaterally. Cardio: Regular, bradycardic. GI: Abdomen soft and nontender. No organomegaly. Vascular: No leg edema. Skin: Ecchymoses scattered over forearms.    Lab Results:  Lab Results  Component Value Date   WBC 4.3 06/25/2017   HGB 15.9 06/25/2017   HCT 47.1 06/25/2017   MCV 109.3 (H) 06/25/2017   PLT 224 06/25/2017   NEUTROABS 2.8 06/25/2017    Imaging:  No results found.  Medications: I have reviewed the patient's current medications.  Assessment/Plan: 1. IgG multiple myeloma, stage I, standard risk by cytogenetics (+4 and +11), in remission currently on maintenance Revlimid 5 mg daily 3 weeks on/1 week off, resumed 05/22/2017 after being held for 4 weeks due to shingles. He receives Zometa every 3 months, due today. 2. Disseminated shingles April 2018. Valacyclovir prophylaxis resumed following office visit 05/14/2017. 3. T12 pathologic compression fracture and diffuse myelomatous involvement in  bones. 4. Nonobstructive CAD, STEMI, Non ischemic CM, Takatsubo, with EF of 35-40% 5. Peripheral neuropathy, grade 1   Disposition: Mr. Guay appears stable. He continues maintenance Revlimid. Due for Zometa today.  He is bradycardic on exam. EKG shows heart rate of 49 with no evidence of heart block. Review of vitals signs in EMR shows similar heart rate in the past. He appears asymptomatic. Concerning symptoms were reviewed. He understands to contact Dr. Debara Pickett with new symptoms.   He will return for labs and follow-up in 6 weeks.   Plan and EKG reviewed with Dr. Benay Spice. 25 minutes were spent fact to face at today's visit with the majority of that time involved in counseling/coordination of care.         Ned Card ANP/GNP-BC   06/25/2017  9:40 AM

## 2017-06-25 NOTE — Telephone Encounter (Signed)
Scheduled appt per 6/29 los - Gave patient AVS and calender per los. Lab and Dr. Burr Medico in 6 weeks

## 2017-06-28 LAB — MULTIPLE MYELOMA PANEL, SERUM
ALPHA2 GLOB SERPL ELPH-MCNC: 0.9 g/dL (ref 0.4–1.0)
Albumin SerPl Elph-Mcnc: 3.8 g/dL (ref 2.9–4.4)
Albumin/Glob SerPl: 1.5 (ref 0.7–1.7)
Alpha 1: 0.2 g/dL (ref 0.0–0.4)
B-Globulin SerPl Elph-Mcnc: 1 g/dL (ref 0.7–1.3)
Gamma Glob SerPl Elph-Mcnc: 0.6 g/dL (ref 0.4–1.8)
Globulin, Total: 2.6 g/dL (ref 2.2–3.9)
IGA/IMMUNOGLOBULIN A, SERUM: 9 mg/dL — AB (ref 61–437)
IGG (IMMUNOGLOBIN G), SERUM: 645 mg/dL — AB (ref 700–1600)
IGM (IMMUNOGLOBIN M), SRM: 14 mg/dL — AB (ref 20–172)
TOTAL PROTEIN: 6.4 g/dL (ref 6.0–8.5)

## 2017-06-28 LAB — KAPPA/LAMBDA LIGHT CHAINS
IG LAMBDA FREE LIGHT CHAIN: 14.6 mg/L (ref 5.7–26.3)
Ig Kappa Free Light Chain: 15.1 mg/L (ref 3.3–19.4)
Kappa/Lambda FluidC Ratio: 1.03 (ref 0.26–1.65)

## 2017-07-19 ENCOUNTER — Other Ambulatory Visit: Payer: Self-pay | Admitting: *Deleted

## 2017-07-19 DIAGNOSIS — C9001 Multiple myeloma in remission: Secondary | ICD-10-CM

## 2017-07-19 MED ORDER — LENALIDOMIDE 5 MG PO CAPS: 5.0000 mg | ORAL_CAPSULE | Freq: Every day | ORAL | 0 refills | Status: DC

## 2017-08-05 NOTE — Progress Notes (Signed)
Eric Dudley  [phone] Fax:([phone]) [phone]  Clinic Follow Up Note   Patient Care Team: [name], MD as PCP - General (Internal Medicine) Debara Pickett Nadean Corwin, MD as Consulting Physician (Cardiology) Truitt Merle, MD as Consulting Physician (Hematology) 08/06/2017   CHIEF COMPLAINTS: Follow-up multiple myeloma    Multiple myeloma in remission ([place])   01/31/2015 Tumor Marker    SPEP showed M-SPIKE 1.7g/dl, Cr 1.5, no anemia, hypercalcemia      03/19/2015 Imaging    Bone survey showed no discrete lytic lesion, but there is osteopenia and calvarial heterogeneity.      03/27/2015 Initial Diagnosis    Multiple myeloma, stage I      03/27/2015 Bone Marrow Biopsy    Bone Marrow, Aspirate,Biopsy: - HYPERCELLULAR BONE MARROW FOR AGE WITH PLASMA CELL NEOPLASM 47% - SEE COMMENT. PERIPHERAL BLOOD: - NO SIGNIFICANT MORPHOLOGIC ABNORMALITIES. Diagnosis Note The bone marrow shows increased number of       03/27/2015 Miscellaneous    bone marrow Cytogenetics: normal. FISH panel showed the presence of +4 and +11, this is consider standard risk of MM       04/16/2015 - 04/23/2015 Hospital Admission    He was admitted for worsening back pain, was found to have a T12 pathological fracture. He underwent kyphoplasty on 04/19/2015.      04/17/2015 Imaging    MR Thoracic and Lumbar Spine w/o Contrast 04/17/2015 IMPRESSION: MR THORACIC SPINE IMPRESSION: Pathologic compression fracture at T8 in this patient with widespread multiple myeloma. Slight retropulsion of abnormal bone without conus compression. MR LUMBAR SPINE IMPRESSION: Focal myeloma deposits at L3 and T9 without significant compression deformity. Mild stenosis at L4-5 related to ordinary spondylosis.      04/19/2015 - 08/02/2015 Chemotherapy    RVD with weekly Velcade 1.3 mg/m, dexamethasone 40 mg, and Revlimid 10 mg daily on day 1-21, every 28 days, cycle 1 Revlimid was interupted by hospitalization,  cycle 3 Revlimid dose increased to 25 mg daily due to his improved renal function. total 4 cyc      04/26/2015 - 05/01/2015 Hospital Admission    He was admitted to [facility] for syncope episode during his radiation simulation. EKG showed ST elevation, troponin was positive, he underwent cardio catheterization which showed mild stenosis. No intervention was needed. echo showed EF 35-40%      04/29/2015 - 05/10/2015 Radiation Therapy    palliative RT to T12, 20Gy in 10 fractions      08/19/2015 Bone Marrow Transplant    He underwent autologous stem cell transplant at [facility].       09/30/2015 Imaging    CT anginal chest 09/30/2015 IMPRESSION: 1. No pulmonary embolism. 2. No thoracic aortic aneurysm or dissection. 3. Heart size is normal. No pericardial effusion. 4. Mild scarring/atelectasis at each lung base, probably chronic. Lungs otherwise clear. No evidence of pneumonia. No pleural effusion. 5. Evidence of diffuse multiple myeloma involvement throughout the thoracolumbar spine and multiple ribs bilaterally, with areas of present clinical relevance described below. 6. Compression fracture deformity of the T12 vertebral body, known pathologic fracture, status post treatment with vertebroplasty on 04/19/2015. 7. Milder compression deformity of the overlying T11 vertebral body, approximately 40% compressed anteriorly, also presumably pathologic. This vertebral body appeared essentially normal on fluoroscopic images from the earlier aforementioned vertebroplasty of 04/19/2015. 8. Slight irregularity of the right lateral sixth rib, with subtle cortical sclerosis suggesting nondisplaced healing fracture. This may be a relatively acute fracture and is probably pathologic in nature  related to underlying multiple myeloma. 9. Similar irregularity of the left lateral fifth rib, suggesting minimally displaced healing fracture. This may also be a relatively acute fracture and  is likely pathologic in nature related to underlying multiple myeloma. These acute or subacute rib fractures are the most likely source of patient's bilateral chest pain. Additional incidental findings in the upper abdomen: Fatty infiltration of liver, cholelithiasis without evidence of acute cholecystitis, fairly large amount of stool within the nondistended colon of the upper abdomen (constipation?).      11/27/2015 Bone Marrow Biopsy    hypocellular marrow (10%), no increased plasma cells       12/29/2015 -  Chemotherapy    maintenance Revlimid 38m daily, 3 weeks on, one week off   Held for Hospital stay and will restart 05/22/17       04/22/2017 - 04/26/2017 Hospital Admission    Admit date: 04/22/17-04/26/17 Admission diagnosis: Shingles Herpes Zoster Additional comments: Held Revlimid during hospital stay and healing        HISTORY OF PRESENTING ILLNESS (03/15/2015):  Eric Gitelman66y.o. 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  He has been having low back pain for 3-4 months. He had food posinin 4 month ago, and had frequent diarrhea. He started noticed low back pain since then. The pain is not radiating to leg, he also has intermittent right rib pain, worse with cough and sneezing. He remains to be physically active, able to do all the activities, such as gardening work housework without much limitation, but he doesn't sings slowly because of back pain. He takes Tylenol as needed, does not like the necrotic pain medication. No night sweats, no fever or chillss, no weight loss.   He was seen by his primary care physician Dr. RMerrilee Seashore MD and had lab test (see below). He also had bone density scan 2/25 which showed osteoporosis, has not been treated yet.  Current therapy:  Maintenance Revlimid 5 mg daily, 3 weeks on, one-week off, started on  12/29/2015 Zometa injections every 3 months started 06/26/16 Revlimid Held for 4 weeks due to shingles episode, will restart week of 05/22/17  INTERIM HISTORY:  Mr. TDemaraisreturns for follow-up. He presents to the clinic today reporting his nose will bleed unexpectantly. It will stop after applying pressure. He has write spot on his left hand. He is outside most of the time. The spot does not bother him. He denies feer, chills or other bleeding. He is eating very well. His energy is alright, he gets tired easily but remains active.    MEDICAL HISTORY:  Past Medical History:  Diagnosis Date  . Cholelithiases   . Chronic kidney disease    MILD,CHRONIC  . Dyslipidemia   . Hyperlipidemia   . Hypertension   . [place]onsillar cancer [place] 2006    SURGICAL HISTORY: Past Surgical History:  Procedure Laterality Date  . APPENDECTOMY  1962  . CARDIAC CATHETERIZATION N/A 04/30/2015   Procedure: Left Heart Cath and Coronary Angiography;  Surgeon: Peter M JMartinique MD;  [facility]  Service: Cardiovascular;  Laterality: N/A;  . CARDIOLITE MYOCARDIAL PERFUSION STUDY  04/16/03   NEGITIVE BRUCE PROTOCAL EXERCISE STRESS TEST. EF 70%. NO ISCHEMIA.  .Marland KitchenKNEE SURGERY  2012   right  . SHOULDER SURGERY  1999   left    SOCIAL HISTORY: History  Social History  . Marital Status: Married    Spouse Name: N/A  . Number of Children: 1   . Years of Education: N/A   Occupational History  . A retired Therapist, occupational    Social History Main Topics  . Smoking status: Former Smoker    Types: Cigars    Quit date: 12/29/2003  . Smokeless tobacco: Former Systems developer    Types: Chew    Quit date: 12/28/1994  . Alcohol Use: Yes     Comment: occasional beer  . Drug Use: No  . Sexual Activity: Not on file   Other Topics Concern  . Not on file   Social History Narrative  . No narrative on file    FAMILY HISTORY: Family History  Problem Relation Age of Onset  . Cancer Mother 37        cervical cancer   . Cancer Father        unknown cancer   . Hypertension Sister   . Cancer Sister        melanoma     ALLERGIES:  is allergic to hydrochlorothiazide; asa [aspirin]; calcium carbonate; oxycodone; amoxicillin; and indapamide.  MEDICATIONS:  Current Outpatient Prescriptions  Medication Sig Dispense Refill  . aspirin 81 MG chewable tablet Chew 1 tablet (81 mg total) by mouth daily. (Patient taking differently: Chew 81 mg by mouth at bedtime. )    . calcium carbonate (OS-CAL) 600 MG TABS tablet Take 1,200 mg by mouth at bedtime.     . Cholecalciferol (VITAMIN D3) 1000 units CAPS Take 2,000 mg by mouth daily.    . diphenhydrAMINE (BENADRYL) 25 mg capsule Take 25 mg by mouth at bedtime as needed for sleep.     [name] guaiFENesin (MUCINEX) 600 MG 12 hr tablet Take 1,200 mg by mouth.    . lenalidomide (REVLIMID) 5 MG capsule Take 1 capsule (5 mg total) by mouth daily. Take Revlimid  5 mg by mouth daily  For  21 days ,  Rest  7 days. 21 capsule 0  . Multiple Vitamins-Minerals (CENTRUM SILVER ULTRA MENS PO) Take 1 tablet by mouth at bedtime.     . nitroGLYCERIN (NITROSTAT) 0.4 MG SL tablet Place 0.4 mg under the tongue every 5 (five) minutes x 3 doses as needed for chest pain. Reported on 05/19/2016    . ondansetron (ZOFRAN) 4 MG tablet TAKE 1 TABLET FOUR TIMES A DAY AS NEEDED FOR NAUSEA OR VOMITING 30 tablet 2  . polyethylene glycol (MIRALAX / GLYCOLAX) packet Take 17 g by mouth daily as needed (constipation).     . potassium chloride (MICRO-K) 10 MEQ CR capsule TAKE 3 CAPSULES DAILY 90 capsule 2  . prochlorperazine (COMPAZINE) 10 MG tablet Take 10 mg by mouth at bedtime as needed for nausea.     . ranitidine (ZANTAC) 150 MG tablet Take 2 tablets (300 mg total) by mouth at bedtime. (Patient taking differently: Take 150 mg by mouth at bedtime. ) 60 tablet 1  . valACYclovir (VALTREX) 1000 MG tablet Take 1 tablet (1,000 mg total) by mouth daily. 60 tablet 2  . Zoledronic Acid (ZOMETA) 4  MG/100ML IVPB Inject 4 mg into the vein. EVERY 60 DAYS    . zolpidem (AMBIEN) 5 MG tablet Take 1 tablet (5 mg total) by mouth at bedtime as needed for sleep. 30 tablet 0   No current facility-administered medications for this visit.     REVIEW OF SYSTEMS:   Constitutional: Denies fevers, chills or abnormal night sweats Eyes:  Denies blurriness of vision, double vision or watery eyes Ears, nose, mouth, throat, and face: Denies mucositis or sore throat (+) unexpected occasional epistaxis Respiratory: Denies cough, dyspnea or wheezes Cardiovascular: Denies palpitation, chest discomfort or lower extremity swelling Gastrointestinal:  Denies nausea, heartburn or change in bowel habits Skin: (+) rash from shingles is healing (+) white spot on left hand Lymphatics: Denies new lymphadenopathy or easy bruising Neurological:Denies numbness, tingling or new weaknesses Behavioral/Psych: Mood is stable, no new changes  Musculoskeletal: Positive for back pain and intermittent rib pain All other systems were reviewed with the patient and are negative.  PHYSICAL EXAMINATION:  ECOG PERFORMANCE STATUS: 1 BP 134/63 (BP Location: Right Arm, Patient Position: Sitting)   Pulse (!) 46 Comment: RN Myrtle aware of pulse  Temp 97.6 F (36.4 C) (Oral)   Resp 17   Ht '5\' 9"'  (1.753 m)   Wt 159 lb 6.4 oz (72.3 kg)   SpO2 100%   BMI 23.54 kg/m    GENERAL: well-appearing, no distress SKIN: skin color, texture, turgor are normal, no rashes or significant lesions except to some healing rash on his left upper face, his abdomen due to shingles EYES: normal, conjunctiva are pink and non-injected, sclera clear OROPHARYNX:no exudate, no erythema and lips, buccal mucosa, and tongue normal  NECK: supple, thyroid normal size, non-tender, without nodularity LYMPH:  no palpable lymphadenopathy in the cervical, axillary or inguinal LUNGS: clear to auscultation and percussion with normal breathing effort HEART: regular rate  & rhythm and no murmurs and no lower extremity edema ABDOMEN:abdomen soft, non-tender and normal bowel sounds Musculoskeletal:no cyanosis of digits and no clubbing, mild tenderness on bilateral lateral chest wall.  PSYCH: alert & oriented x 3 with fluent speech NEURO: no focal motor/sensory deficits CHEST WALL: slightly enlarged left breast, mild tenderness, no palpable mass. He states his left breast is slightly bigger than right for all his adult life.  LABORATORY DATA:   CBC Latest Ref Rng & Units 08/06/2017 06/25/2017 05/14/2017  WBC 4.0 - 10.3 10e3/uL 5.4 4.3 3.1(L)  Hemoglobin 13.0 - 17.1 g/dL 16.4 15.9 16.2  Hematocrit 38.4 - 49.9 % 48.5 47.1 48.0  Platelets 140 - 400 10e3/uL 162 224 202   CMP Latest Ref Rng & Units 08/06/2017 06/25/2017 06/25/2017  Glucose 70 - 140 mg/dl 101 94 -  BUN 7.0 - 26.0 mg/dL 9.5 10.9 -  Creatinine 0.7 - 1.3 mg/dL 1.4(H) 1.2 -  Sodium 136 - 145 mEq/L 139 140 -  Potassium 3.5 - 5.1 mEq/L 3.2(L) 3.5 -  Chloride 101 - 111 mmol/L - - -  CO2 22 - 29 mEq/L 26 21(L) -  Calcium 8.4 - 10.4 mg/dL 9.4 9.7 -  Total Protein 6.4 - 8.3 g/dL 6.6 6.9 6.4  Total Bilirubin 0.20 - 1.20 mg/dL 0.65 0.66 -  Alkaline Phos 40 - 150 U/L 114 130 -  AST 5 - 34 U/L 33 25 -  ALT 0 - 55 U/L 54 34 -    MM lab: SPEP M-protein (g/dl) 03/15/2015: 1.6 05/24/2015: 0.5 06/21/2015: 0.4 07/18/2015 (at [facility]): 0.33 11/27/2015: not det 01/21/2016: not det  03/24/2016: not det 05/19/2016: not det  08/07/2016: not det  10/30/2017: not det 03/18/2017: Not det 06/25/17: not det.   Serum IgG (mg/dl) 03/15/2015: 2000 05/24/2015: 579 06/21/2015: 673 07/18/2015 ([facility]): 509 11/27/2015: 396 01/21/2016: 487 03/24/2016: 563 05/19/2016: 600 08/07/2016: 530 10/30/2016: 589 03/18/2017: 584 06/25/17: 645  Serum kappa/lamda/ratio (mg/dl)  03/15/2015: 690, 0.13, 5308 05/24/2015: 146, 1.27, 115.0 06/21/2015: 86, 0.74, 116.2  01/21/2016: 1.09, 1.21, 0.9 03/24/2016: 1.72, 1.49, 1.16 05/19/2016: 1.91, 1.50,  1.27 08/07/2016: 1.36, 1.41, 0.96  10/30/2016: 1.6, 1.32, 1.21  03/18/2017: 1.67, 1.55, 1.08 06/25/17: 1.51, 1.46, 1.03  24 h urine kappa light chain (mg) 03/18/2015: 2434 05/28/2015: 582 06/24/2015: 322 04/27/2016: 499    PATHOLOGY REPORT Bone Marrow, Aspirate,Biopsy, and Clot, right iliac 03/27/2015 - HYPERCELLULAR BONE MARROW FOR AGE WITH PLASMA CELL NEOPLASM. - SEE COMMENT. PERIPHERAL BLOOD: - NO SIGNIFICANT MORPHOLOGIC ABNORMALITIES. Diagnosis Note The bone marrow shows increased number of atypical plasma cells representing 47% of all cells in the aspirate associated with prominent interstitial infiltrates and variably sized aggregates in the clot and biopsy sections. Immunohistochemical stains show that the plasma cells are kappa light chain restricted consistent with plasma cell neoplasm. The background shows trilineage Cytogenetics: normal FISH panel: +4 and +11  Bone Marrow, Aspirate,Biopsy, and Clot, 11/27/2015 at [place]: [id number] RECEIVED: 11/27/2015 ORDERING PHYSICIAN: CESAR Melburn Hake , MD PATIENT NAME: Gerrit Heck BONE MARROW REPORT Bone Marrow (BM) and Peripheral Blood (PB) FINAL PATHOLOGIC DIAGNOSIS BONE MARROW: Hypocellular marrow (10%) with no increase in plasma cells. See comment. PERIPHERAL BLOOD: Absolute lymphopenia. See CBC data. COMMENT: The touch prep is cellular and adequate for evaluation. Rare plasma cells are present (<1%). Blasts are not increased, Myeloid and erythroid cells are present in normal proportions with intact maturation and no overt dysplastic changes. Megakaryocytes are absent. The aspirate smears are aspicular and hemodilute. Bone marrow and clot sections are variably hypocellular (0-20% range, 10% overall). Scattered interstitial plasma cells are present and comprise <5% of marrow cellularity by CD138 immunohistochemical analysis. Blasts are not increased. Myeloid and erythroid cells are present in  normal proportions and have no overt morphologic abnormalities. Megakaryocytes are morphologically within normal limits. Examination of the peripheral blood reveals absolute lymphopenia. There are no circulating plasma cells or evidence of rouleaux formation.   RADIOGRAPHIC STUDIES: No new scans   ASSESSMENT & PLAN:  66 y.o. Caucasian male with past medical history of hypertension, tonsil cancer status post surgery and radiation 10 years ago, now presented with 3-4 months low back pain and intermittent rib pain. His blood test showed M protein 1.7g/dl.  1. IgG multiple myeloma, stage I, standard risk by cytogenetics (+4 and +11), in remission  -I previously discussed his initial bone marrow biopsy results with him. He has significant amount of plasma cells in the bone marrow 47%.  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 Based on the new notable myeloma diagnostic criteria, he meets the criteria of multiple myeloma. He has normal albumin and miildly elevated beta 2 microglobulin, this is stage I. -His cytogenetics and Fish panel revealed standard risk. -His thoracic and lumbar spine MRI showed pathologic compression fracture at T12, focal myeloma deposits at L3 and T9, diffuse marrow signal abnormality consistent with myeloma -I previously recommended induction chemotherapy with VRD regimen, weekly Velcade and dexamethasone, first 2 cycles Revlimid dose reduced to 10 mg daily 3 week on, one-week off, based on his renal function, and it which was increased to full dose from cycle 3. He received a total of 4 cycles of treatment, achieved a very good partial response. Repeat bone marrow on 07/18/2015 showed 5% plasma cells, fish and cytogenetics  was normal. -His post  transplant evaluation previously showed complete remission (<1% plasma cell in marrow, undetectable M protein), and he has recovered very well. -He previously started maintenance Revlimid 5 mg daily, 3 weeks on, 1 week off, tolerating well, we'll continue. -His Revlimid was held to 3 weeks due to disseminated herpes zoster infection. He has now recovered well. He started next cycle (5/26) -He is doing well, will monitoring lab every 2 months  - previously Discussed possibility of Revlimid may iIncreasing risk of secondary cancer occurring. -Colonoscopy in 2020, he does PSA screening at [facility]  -continue Zometa every 3 months for a total of 2 years  -continue acyclovir for herpes suppression  -labs reviewed his Cr. Is slightly elevated at 1.4 (08/06/17). I recommend him to not take Advil or ibuprofen and to drink more water. His potassium is 3.2, I suggest he takes 3 for the next 5 days then return back to 2 a day. Blood counts are normal.  -Based on his 06/25/17 MM panel he is in remission. -Will continue with one more infusion of Zometa and will be done  -F/u in 6 weeks then every 2-3 months while on Revlimid -due for Flu vaccine by next visit  -Will notify us if white spot on left hand changes -He is scheduled to see Dr. Norma Fredrickson at [facility] next month.  2. Disseminated Shingles in 03/2017 -He was admitted to the hospital for Shingles on 4/26-4/30 -This flared due to compromised immune system while on Revlimid -We will restart acyclovir 1026m daily for prophylaxis -Will follow up with PCP to review when to stop treatment  3. T12 pathologic compression fracture, and diffuse myeloma involvement in bones -Status post kyphoplasty and palliative radiation  -He previously received palliative Radiation, pain much improved overall. He does complain more mid back and right-sided chest pain daily, likely related to his physical activities. -continue zometa infusion every 3 months, he  has been seeing his dentist regularly.   4. nonobstructive CAD, STEMI, Non ischemic CM, Takatsubo, with EF of 35-40% -Patient was initially found to have troponin elevation and anterior apical ST elevation MI and resultant cardiomyopathy on 04/26/15 -He previously underwent a cardiac catheterization on 5/3 which showed mild diffuse disease, no stents were placed. He is on aspirin -follow up with Dr. HDebara Pickett -He has recovered well. Repeat echo before transplant showed EF 55-60% in 11/2015   5. Peripheral neuropathy, grade 1 -Likely secondary to his bone marrow transplant chemotherapy -Mild and Stable overall, we'll continue observation  6. Advanced directives -his advanced directives is in EPIC -he is full code.   Plan -Continue Revlimid maintenance, 5 mg daily for 3 weeks, then one week off  -Lab and f/u and zometa infusion in 6 weeks    All questions were answered. The patient knows to call the clinic with any problems, questions or concerns.  I spent 20 minutes counseling the patient face to face. The total time spent in the appointment was 30 minutes and more than 50% was on counseling.     FTruitt Merle MD 08/06/2017     This document serves as a record of services personally performed by YTruitt Merle MD. It was created on her behalf by AJoslyn Devon a trained medical scribe. The creation of this record is based on the scribe's personal observations and the provider's statements to them. This document has been checked and approved by the attending provider.

## 2017-08-06 ENCOUNTER — Encounter: Payer: Self-pay | Admitting: Hematology

## 2017-08-06 ENCOUNTER — Other Ambulatory Visit (HOSPITAL_BASED_OUTPATIENT_CLINIC_OR_DEPARTMENT_OTHER): Payer: Medicare Other

## 2017-08-06 ENCOUNTER — Telehealth: Payer: Self-pay | Admitting: Hematology

## 2017-08-06 ENCOUNTER — Ambulatory Visit (HOSPITAL_BASED_OUTPATIENT_CLINIC_OR_DEPARTMENT_OTHER): Payer: Medicare Other | Admitting: Hematology

## 2017-08-06 VITALS — BP 134/63 | HR 46 | Temp 97.6°F | Resp 17 | Ht 69.0 in | Wt 159.4 lb

## 2017-08-06 DIAGNOSIS — G620 Drug-induced polyneuropathy: Secondary | ICD-10-CM

## 2017-08-06 DIAGNOSIS — C9001 Multiple myeloma in remission: Secondary | ICD-10-CM

## 2017-08-06 DIAGNOSIS — N182 Chronic kidney disease, stage 2 (mild): Secondary | ICD-10-CM

## 2017-08-06 DIAGNOSIS — I10 Essential (primary) hypertension: Secondary | ICD-10-CM

## 2017-08-06 DIAGNOSIS — I428 Other cardiomyopathies: Secondary | ICD-10-CM

## 2017-08-06 LAB — COMPREHENSIVE METABOLIC PANEL
ALT: 54 U/L (ref 0–55)
AST: 33 U/L (ref 5–34)
Albumin: 3.7 g/dL (ref 3.5–5.0)
Alkaline Phosphatase: 114 U/L (ref 40–150)
Anion Gap: 7 mEq/L (ref 3–11)
BILIRUBIN TOTAL: 0.65 mg/dL (ref 0.20–1.20)
BUN: 9.5 mg/dL (ref 7.0–26.0)
CHLORIDE: 106 meq/L (ref 98–109)
CO2: 26 mEq/L (ref 22–29)
CREATININE: 1.4 mg/dL — AB (ref 0.7–1.3)
Calcium: 9.4 mg/dL (ref 8.4–10.4)
EGFR: 52 mL/min/{1.73_m2} — ABNORMAL LOW (ref >90)
GLUCOSE: 101 mg/dL (ref 70–140)
Potassium: 3.2 mEq/L — ABNORMAL LOW (ref 3.5–5.1)
SODIUM: 139 meq/L (ref 136–145)
TOTAL PROTEIN: 6.6 g/dL (ref 6.4–8.3)

## 2017-08-06 LAB — CBC WITH DIFFERENTIAL/PLATELET
BASO%: 0.5 % (ref 0.0–2.0)
Basophils Absolute: 0 10*3/uL (ref 0.0–0.1)
EOS%: 3.5 % (ref 0.0–7.0)
Eosinophils Absolute: 0.2 10*3/uL (ref 0.0–0.5)
HCT: 48.5 % (ref 38.4–49.9)
HGB: 16.4 g/dL (ref 13.0–17.1)
LYMPH%: 13.8 % — AB (ref 14.0–49.0)
MCH: 37.3 pg — ABNORMAL HIGH (ref 27.2–33.4)
MCHC: 33.8 g/dL (ref 32.0–36.0)
MCV: 110.5 fL — ABNORMAL HIGH (ref 79.3–98.0)
MONO#: 1.1 10*3/uL — AB (ref 0.1–0.9)
MONO%: 20.6 % — AB (ref 0.0–14.0)
NEUT%: 61.6 % (ref 39.0–75.0)
NEUTROS ABS: 3.3 10*3/uL (ref 1.5–6.5)
Platelets: 162 10*3/uL (ref 140–400)
RBC: 4.39 10*6/uL (ref 4.20–5.82)
RDW: 14.5 % (ref 11.0–14.6)
WBC: 5.4 10*3/uL (ref 4.0–10.3)
lymph#: 0.7 10*3/uL — ABNORMAL LOW (ref 0.9–3.3)

## 2017-08-06 NOTE — Telephone Encounter (Signed)
Gave patient avs and calendar for upcoming appts.  °

## 2017-08-09 ENCOUNTER — Other Ambulatory Visit: Payer: Self-pay | Admitting: *Deleted

## 2017-08-09 DIAGNOSIS — C9001 Multiple myeloma in remission: Secondary | ICD-10-CM

## 2017-08-09 MED ORDER — LENALIDOMIDE 5 MG PO CAPS: 5.0000 mg | ORAL_CAPSULE | Freq: Every day | ORAL | 0 refills | Status: DC

## 2017-08-16 ENCOUNTER — Other Ambulatory Visit: Payer: Self-pay | Admitting: *Deleted

## 2017-08-27 DIAGNOSIS — C9001 Multiple myeloma in remission: Secondary | ICD-10-CM | POA: Diagnosis not present

## 2017-08-27 DIAGNOSIS — Z23 Encounter for immunization: Secondary | ICD-10-CM | POA: Diagnosis not present

## 2017-08-27 DIAGNOSIS — B027 Disseminated zoster: Secondary | ICD-10-CM | POA: Diagnosis not present

## 2017-08-27 DIAGNOSIS — Z9484 Stem cells transplant status: Secondary | ICD-10-CM | POA: Diagnosis not present

## 2017-08-27 DIAGNOSIS — C9000 Multiple myeloma not having achieved remission: Secondary | ICD-10-CM | POA: Diagnosis not present

## 2017-08-27 DIAGNOSIS — Z79899 Other long term (current) drug therapy: Secondary | ICD-10-CM | POA: Diagnosis not present

## 2017-08-31 ENCOUNTER — Telehealth: Payer: Self-pay | Admitting: *Deleted

## 2017-08-31 ENCOUNTER — Emergency Department (HOSPITAL_COMMUNITY): Payer: Medicare Other

## 2017-08-31 ENCOUNTER — Encounter (HOSPITAL_COMMUNITY): Payer: Self-pay

## 2017-08-31 ENCOUNTER — Inpatient Hospital Stay (HOSPITAL_COMMUNITY)
Admission: EM | Admit: 2017-08-31 | Discharge: 2017-09-03 | DRG: 871 | Disposition: A | Discharge status: Home / Self Care | Payer: Medicare Other | Attending: Internal Medicine | Admitting: Internal Medicine

## 2017-08-31 DIAGNOSIS — Z9049 Acquired absence of other specified parts of digestive tract: Secondary | ICD-10-CM

## 2017-08-31 DIAGNOSIS — N179 Acute kidney failure, unspecified: Secondary | ICD-10-CM | POA: Diagnosis present

## 2017-08-31 DIAGNOSIS — Z9484 Stem cells transplant status: Secondary | ICD-10-CM

## 2017-08-31 DIAGNOSIS — Z9889 Other specified postprocedural states: Secondary | ICD-10-CM

## 2017-08-31 DIAGNOSIS — Z880 Allergy status to penicillin: Secondary | ICD-10-CM

## 2017-08-31 DIAGNOSIS — Z85818 Personal history of malignant neoplasm of other sites of lip, oral cavity, and pharynx: Secondary | ICD-10-CM

## 2017-08-31 DIAGNOSIS — E871 Hypo-osmolality and hyponatremia: Secondary | ICD-10-CM | POA: Diagnosis present

## 2017-08-31 DIAGNOSIS — A419 Sepsis, unspecified organism: Secondary | ICD-10-CM | POA: Diagnosis not present

## 2017-08-31 DIAGNOSIS — Z79899 Other long term (current) drug therapy: Secondary | ICD-10-CM

## 2017-08-31 DIAGNOSIS — J189 Pneumonia, unspecified organism: Secondary | ICD-10-CM | POA: Diagnosis not present

## 2017-08-31 DIAGNOSIS — E876 Hypokalemia: Secondary | ICD-10-CM | POA: Diagnosis present

## 2017-08-31 DIAGNOSIS — Z87891 Personal history of nicotine dependence: Secondary | ICD-10-CM | POA: Diagnosis not present

## 2017-08-31 DIAGNOSIS — N182 Chronic kidney disease, stage 2 (mild): Secondary | ICD-10-CM | POA: Diagnosis present

## 2017-08-31 DIAGNOSIS — Z9481 Bone marrow transplant status: Secondary | ICD-10-CM

## 2017-08-31 DIAGNOSIS — B009 Herpesviral infection, unspecified: Secondary | ICD-10-CM | POA: Diagnosis present

## 2017-08-31 DIAGNOSIS — R7989 Other specified abnormal findings of blood chemistry: Secondary | ICD-10-CM | POA: Diagnosis present

## 2017-08-31 DIAGNOSIS — Z888 Allergy status to other drugs, medicaments and biological substances status: Secondary | ICD-10-CM

## 2017-08-31 DIAGNOSIS — Z808 Family history of malignant neoplasm of other organs or systems: Secondary | ICD-10-CM | POA: Diagnosis not present

## 2017-08-31 DIAGNOSIS — Z7982 Long term (current) use of aspirin: Secondary | ICD-10-CM

## 2017-08-31 DIAGNOSIS — C9001 Multiple myeloma in remission: Secondary | ICD-10-CM | POA: Diagnosis present

## 2017-08-31 DIAGNOSIS — R918 Other nonspecific abnormal finding of lung field: Secondary | ICD-10-CM | POA: Diagnosis not present

## 2017-08-31 DIAGNOSIS — Z8249 Family history of ischemic heart disease and other diseases of the circulatory system: Secondary | ICD-10-CM

## 2017-08-31 DIAGNOSIS — I129 Hypertensive chronic kidney disease with stage 1 through stage 4 chronic kidney disease, or unspecified chronic kidney disease: Secondary | ICD-10-CM | POA: Diagnosis present

## 2017-08-31 HISTORY — DX: Multiple myeloma not having achieved remission: C90.00

## 2017-08-31 HISTORY — DX: Unspecified osteoarthritis, unspecified site: M19.90

## 2017-08-31 LAB — I-STAT CG4 LACTIC ACID, ED: LACTIC ACID, VENOUS: 2.04 mmol/L — AB (ref 0.5–1.9)

## 2017-08-31 LAB — URINALYSIS, ROUTINE W REFLEX MICROSCOPIC
BACTERIA UA: NONE SEEN
BILIRUBIN URINE: NEGATIVE
GLUCOSE, UA: 150 mg/dL — AB
Ketones, ur: NEGATIVE mg/dL
LEUKOCYTES UA: NEGATIVE
NITRITE: NEGATIVE
PH: 7 (ref 5.0–8.0)
Protein, ur: 100 mg/dL — AB
SPECIFIC GRAVITY, URINE: 1.009 (ref 1.005–1.030)
Squamous Epithelial / LPF: NONE SEEN

## 2017-08-31 LAB — CBC WITH DIFFERENTIAL/PLATELET
Basophils Absolute: 0 10*3/uL (ref 0.0–0.1)
Basophils Relative: 0 %
Eosinophils Absolute: 0 10*3/uL (ref 0.0–0.7)
Eosinophils Relative: 0 %
HEMATOCRIT: 47.8 % (ref 39.0–52.0)
Hemoglobin: 17 g/dL (ref 13.0–17.0)
Lymphocytes Relative: 8 %
Lymphs Abs: 0.5 10*3/uL — ABNORMAL LOW (ref 0.7–4.0)
MCH: 36.7 pg — AB (ref 26.0–34.0)
MCHC: 35.6 g/dL (ref 30.0–36.0)
MCV: 103.2 fL — AB (ref 78.0–100.0)
MONO ABS: 1.6 10*3/uL — AB (ref 0.1–1.0)
MONOS PCT: 23 %
Neutro Abs: 4.9 10*3/uL (ref 1.7–7.7)
Neutrophils Relative %: 69 %
Platelets: 158 10*3/uL (ref 150–400)
RBC: 4.63 MIL/uL (ref 4.22–5.81)
RDW: 13.5 % (ref 11.5–15.5)
WBC: 7.1 10*3/uL (ref 4.0–10.5)

## 2017-08-31 LAB — COMPREHENSIVE METABOLIC PANEL
ALK PHOS: 114 U/L (ref 38–126)
ALT: 90 U/L — ABNORMAL HIGH (ref 17–63)
ANION GAP: 11 (ref 5–15)
AST: 71 U/L — ABNORMAL HIGH (ref 15–41)
Albumin: 3.6 g/dL (ref 3.5–5.0)
BILIRUBIN TOTAL: 0.6 mg/dL (ref 0.3–1.2)
BUN: 14 mg/dL (ref 6–20)
CALCIUM: 8.6 mg/dL — AB (ref 8.9–10.3)
CO2: 20 mmol/L — ABNORMAL LOW (ref 22–32)
Chloride: 100 mmol/L — ABNORMAL LOW (ref 101–111)
Creatinine, Ser: 1.73 mg/dL — ABNORMAL HIGH (ref 0.61–1.24)
GFR calc Af Amer: 45 mL/min — ABNORMAL LOW (ref >60)
GFR, EST NON AFRICAN AMERICAN: 39 mL/min — AB (ref >60)
Glucose, Bld: 97 mg/dL (ref 65–99)
POTASSIUM: 2.9 mmol/L — AB (ref 3.5–5.1)
Sodium: 131 mmol/L — ABNORMAL LOW (ref 135–145)
TOTAL PROTEIN: 7.5 g/dL (ref 6.5–8.1)

## 2017-08-31 LAB — PROTIME-INR
INR: 0.99
PROTHROMBIN TIME: 13 s (ref 11.4–15.2)

## 2017-08-31 MED ORDER — SODIUM CHLORIDE 0.9 % IV SOLN
INTRAVENOUS | Status: DC
  Administered 2017-09-01 (×2): via INTRAVENOUS

## 2017-08-31 MED ORDER — ZOLPIDEM TARTRATE 5 MG PO TABS: 5.0000 mg | ORAL_TABLET | Freq: Every evening | ORAL | Status: DC | PRN

## 2017-08-31 MED ORDER — POTASSIUM CHLORIDE CRYS ER 20 MEQ PO TBCR
40.0000 meq | EXTENDED_RELEASE_TABLET | Freq: Once | ORAL | Status: AC
  Administered 2017-08-31: 40 meq via ORAL
  Filled 2017-08-31: qty 2

## 2017-08-31 MED ORDER — POTASSIUM CHLORIDE CRYS ER 20 MEQ PO TBCR
30.0000 meq | EXTENDED_RELEASE_TABLET | Freq: Every day | ORAL | Status: DC
  Administered 2017-09-01: 10:00:00 30 meq via ORAL
  Filled 2017-08-31: qty 1

## 2017-08-31 MED ORDER — VANCOMYCIN HCL 10 G IV SOLR
1250.0000 mg | Freq: Once | INTRAVENOUS | Status: AC
  Administered 2017-08-31: 1250 mg via INTRAVENOUS
  Filled 2017-08-31: qty 1250

## 2017-08-31 MED ORDER — SIMVASTATIN 20 MG PO TABS
20.0000 mg | ORAL_TABLET | Freq: Every day | ORAL | Status: DC
  Administered 2017-09-01 – 2017-09-02 (×2): 20 mg via ORAL
  Filled 2017-08-31 (×2): qty 1

## 2017-08-31 MED ORDER — DEXTROSE 5 % IV SOLN
1.0000 g | Freq: Once | INTRAVENOUS | Status: AC
  Administered 2017-08-31: 1 g via INTRAVENOUS
  Filled 2017-08-31: qty 1

## 2017-08-31 MED ORDER — ENOXAPARIN SODIUM 40 MG/0.4ML ~~LOC~~ SOLN
40.0000 mg | Freq: Every day | SUBCUTANEOUS | Status: DC
  Administered 2017-09-01 – 2017-09-02 (×3): 40 mg via SUBCUTANEOUS
  Filled 2017-08-31 (×3): qty 0.4

## 2017-08-31 MED ORDER — MAGNESIUM OXIDE 400 (241.3 MG) MG PO TABS
800.0000 mg | ORAL_TABLET | Freq: Once | ORAL | Status: AC
  Administered 2017-08-31: 800 mg via ORAL
  Filled 2017-08-31: qty 2

## 2017-08-31 MED ORDER — ASPIRIN 81 MG PO CHEW
81.0000 mg | CHEWABLE_TABLET | Freq: Every day | ORAL | Status: DC
  Administered 2017-09-01 – 2017-09-02 (×3): 81 mg via ORAL
  Filled 2017-08-31 (×3): qty 1

## 2017-08-31 MED ORDER — POTASSIUM CHLORIDE 10 MEQ/100ML IV SOLN
10.0000 meq | INTRAVENOUS | Status: AC
  Administered 2017-08-31 – 2017-09-01 (×2): 10 meq via INTRAVENOUS
  Filled 2017-08-31: qty 100

## 2017-08-31 MED ORDER — VALACYCLOVIR HCL 500 MG PO TABS
1000.0000 mg | ORAL_TABLET | Freq: Every day | ORAL | Status: DC
  Administered 2017-09-01 – 2017-09-03 (×3): 1000 mg via ORAL
  Filled 2017-08-31 (×3): qty 2

## 2017-08-31 MED ORDER — FAMOTIDINE 20 MG PO TABS
20.0000 mg | ORAL_TABLET | Freq: Every day | ORAL | Status: DC
  Administered 2017-09-01 – 2017-09-02 (×3): 20 mg via ORAL
  Filled 2017-08-31 (×3): qty 1

## 2017-08-31 NOTE — ED Triage Notes (Signed)
Patient states he has had an intermittent fever  X 3 days. Patient is currently taking chemo for multiple myeloma. Patient denies any pain.

## 2017-08-31 NOTE — Telephone Encounter (Signed)
Pt called earlier today reporting that he was sick after taking MMR vaccine on Friday at [facility].  He states that he called [facility] & was told to stop revlimid & tylenol.  He took last revlimid last night & last tylenol @ 2 am this morning.  He was told to verify approval with Dr Burr Medico.  Called pt & he states that he felt bad on Saturday after MMR on Friday & ran temp on Sun.  Temp has been as high as 103.3.  Temp in 102 now & he has chills, dizziness, light headed & nausea.  He has a cough which he reports as being normal for him.  He denies any other symptoms.  Encouraged to go to ED.  Discussed with Dr Burr Medico & she is in agreement & pt notified of her concern of him being immunocompromised, & on Revlimid & should be checked out.  He is reluctant but I think will go to ED.

## 2017-08-31 NOTE — ED Provider Notes (Signed)
[facility] DEPT Provider Note   CSN: [id number] Arrival date & time: 08/31/17  1833     History   Chief Complaint Chief Complaint  Patient presents with  . chemo patient  . Fever    HPI Eric Dudley is a 66 y.o. male.  66 yo M with a chief complaint of a fever. This been going on for the past 4 days. As high as 103 at home. He called his oncologist today who told me the get to the ED. The patient denies any symptoms though when I asked him if he had had a cough he did admit to having a bit of a cough over the past few days. Denies sick contacts. Denies shortness breath or chest pain. Denies abdominal pain vomiting or diarrhea.   The history is provided by the patient.  Fever   Associated symptoms include cough. Pertinent negatives include no chest pain, no diarrhea, no vomiting, no congestion and no headaches.  Illness  This is a new problem. The current episode started more than 2 days ago. The problem occurs constantly. The problem has not changed since onset.Pertinent negatives include no chest pain, no abdominal pain, no headaches and no shortness of breath. Nothing aggravates the symptoms. Nothing relieves the symptoms. He has tried nothing for the symptoms. The treatment provided no relief.    Past Medical History:  Diagnosis Date  . Cholelithiases   . Chronic kidney disease    MILD,CHRONIC  . Dyslipidemia   . Hyperlipidemia   . Hypertension   . Multiple myeloma ([place])   . 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 Shingles 04/22/2017  . Dyslipidemia 05/27/2016  . Thoracic compression fracture ([place])   . Osteoporosis 05/18/2015  . NSTEMI (non-ST elevated myocardial infarction) ([place]) 05/01/2015  . Cardiomyopathy, nonischemic ([place])   . ST elevation myocardial infarction involving left anterior descending (LAD) coronary artery ([place])   . Vertebral compression fracture: T12 pathological fracture 04/17/2015  . T12  compression fracture ([place])   . Thoracic back pain   . Multiple myeloma in remission ([place]) 04/03/2015  . Essential hypertension 07/08/2013  . Hyperlipidemia 07/08/2013  . 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 CKD (chronic kidney disease), stage II 07/08/2013    Past Surgical History:  Procedure Laterality Date  . APPENDECTOMY  1962  . CARDIAC CATHETERIZATION N/A 04/30/2015   Procedure: Left Heart Cath and Coronary Angiography;  Surgeon: Peter M Martinique, MD;  Location: [facility] INVASIVE CV LAB CUPID;  Service: Cardiovascular;  Laterality: N/A;  . CARDIOLITE MYOCARDIAL PERFUSION STUDY  04/16/03   NEGITIVE BRUCE PROTOCAL EXERCISE STRESS TEST. EF 70%. NO ISCHEMIA.  [name] KNEE SURGERY  2012   right  . SHOULDER SURGERY  1999   left       Home Medications    Prior to Admission medications   Medication Sig Start Date End Date Taking? Authorizing Provider  aspirin 81 MG chewable tablet Chew 1 tablet (81 mg total) by mouth daily. Patient taking differently: Chew 81 mg by mouth at bedtime.  05/01/15  Yes Caren Griffins, MD  calcium carbonate (OS-CAL) 600 MG TABS tablet Take 1,200 mg by mouth at bedtime.    Yes [provider]  Cholecalciferol (VITAMIN D3) 1000 units CAPS Take 2,000 mg by mouth daily.   Yes [provider]  lenalidomide (REVLIMID) 5 MG capsule Take 1 capsule (5 mg total) by mouth daily. Take Revlimid  5 mg by mouth  daily  For  21 days ,  Rest  7 days. 08/09/17  Yes Truitt Merle, MD  Multiple Vitamins-Minerals (CENTRUM SILVER ULTRA MENS PO) Take 1 tablet by mouth at bedtime.    Yes [provider]  ondansetron (ZOFRAN) 4 MG tablet TAKE 1 TABLET FOUR TIMES A DAY AS NEEDED FOR NAUSEA OR VOMITING 06/15/17  Yes Truitt Merle, MD  potassium chloride (MICRO-K) 10 MEQ CR capsule TAKE 3 CAPSULES DAILY 06/15/17  Yes Truitt Merle, MD  ranitidine (ZANTAC) 150 MG tablet Take 2 tablets (300 mg total) by mouth at bedtime. 05/24/15  Yes Truitt Merle, MD  simvastatin (ZOCOR) 20 MG tablet Take  20 mg by mouth daily.   Yes [provider]  valACYclovir (VALTREX) 1000 MG tablet Take 1 tablet (1,000 mg total) by mouth daily. 05/14/17  Yes Truitt Merle, MD  Zoledronic Acid (ZOMETA) 4 MG/100ML IVPB Inject 4 mg into the vein. EVERY 60 DAYS   Yes [provider]  zolpidem (AMBIEN) 5 MG tablet Take 1 tablet (5 mg total) by mouth at bedtime as needed for sleep. 04/26/17  Yes Theodis Blaze, MD  nitroGLYCERIN (NITROSTAT) 0.4 MG SL tablet Place 0.4 mg under the tongue every 5 (five) minutes x 3 doses as needed for chest pain. Reported on 05/19/2016    [provider]    Family History Family History  Problem Relation Age of Onset  . Cancer Mother 88       cervical cancer   . Cancer Father        unknown cancer   . Hypertension Sister   . Cancer Sister        melanoma     Social History Social History  Substance Use Topics  . Smoking status: Former Smoker    Types: Cigars    Quit date: 12/29/2003  . Smokeless tobacco: Former Systems developer    Types: Chew    Quit date: 12/28/1994  . Alcohol use Yes     Comment: occasional beer     Allergies   Hydrochlorothiazide; Asa [aspirin]; Calcium carbonate; Oxycodone; Amoxicillin; and Indapamide   Review of Systems Review of Systems  Constitutional: Positive for appetite change, fatigue and fever. Negative for chills.  HENT: Negative for congestion and facial swelling.   Eyes: Negative for discharge and visual disturbance.  Respiratory: Positive for cough. Negative for shortness of breath.   Cardiovascular: Negative for chest pain and palpitations.  Gastrointestinal: Negative for abdominal pain, diarrhea and vomiting.  Musculoskeletal: Negative for arthralgias and myalgias.  Skin: Negative for color change and rash.  Neurological: Negative for tremors, syncope and headaches.  Psychiatric/Behavioral: Negative for confusion and dysphoric mood.     Physical Exam Updated Vital Signs BP 125/75 (BP Location: Left Arm)    Pulse (!) 105   Temp (!) 101.4 F (38.6 C) (Oral)   Resp 20   Ht 5' 9" (1.753 m)   Wt 69.9 kg (154 lb)   SpO2 97%   BMI 22.74 kg/m   Physical Exam  Constitutional: He is oriented to person, place, and time. He appears well-developed and well-nourished.  Warm to touch  HENT:  Head: Normocephalic and atraumatic.  Eyes: Pupils are equal, round, and reactive to light. EOM are normal.  Neck: Normal range of motion. Neck supple. No JVD present.  Cardiovascular: Normal rate and regular rhythm.  Exam reveals no gallop and no friction rub.   No murmur heard. Pulmonary/Chest: No respiratory distress. He has no wheezes.  Abdominal: He exhibits  no distension and no mass. There is no tenderness. There is no rebound and no guarding.  Musculoskeletal: Normal range of motion.  Neurological: He is alert and oriented to person, place, and time.  Skin: No rash noted. No pallor.  Psychiatric: He has a normal mood and affect. His behavior is normal.  Nursing note and vitals reviewed.    ED Treatments / Results  Labs (all labs ordered are listed, but only abnormal results are displayed) Labs Reviewed  URINALYSIS, ROUTINE W REFLEX MICROSCOPIC - Abnormal; Notable for the following:       Result Value   Color, Urine STRAW (*)    Glucose, UA 150 (*)    Hgb urine dipstick MODERATE (*)    Protein, ur 100 (*)    All other components within normal limits  CBC WITH DIFFERENTIAL/PLATELET - Abnormal; Notable for the following:    MCV 103.2 (*)    MCH 36.7 (*)    Lymphs Abs 0.5 (*)    Monocytes Absolute 1.6 (*)    All other components within normal limits  COMPREHENSIVE METABOLIC PANEL - Abnormal; Notable for the following:    Sodium 131 (*)    Potassium 2.9 (*)    Chloride 100 (*)    CO2 20 (*)    Creatinine, Ser 1.73 (*)    Calcium 8.6 (*)    AST 71 (*)    ALT 90 (*)    GFR calc non Af Amer 39 (*)    GFR calc Af Amer 45 (*)    All other components within normal limits  I-STAT CG4 LACTIC  ACID, ED - Abnormal; Notable for the following:    Lactic Acid, Venous 2.04 (*)    All other components within normal limits  CULTURE, BLOOD (ROUTINE X 2)  CULTURE, BLOOD (ROUTINE X 2)  PROTIME-INR  CBC WITH DIFFERENTIAL/PLATELET    EKG  EKG Interpretation None       Radiology Dg Chest 2 View  Result Date: 08/31/2017 CLINICAL DATA:  Intermittent fever for 3 days. Active chemotherapy for multiple myeloma. EXAM: CHEST  2 VIEW COMPARISON:  Chest CT 09/30/2015 FINDINGS: Left perihilar patchy opacity corresponds to superior segment of the left lower lobe suspicious for pneumonia. The right lung is clear. Small nodular opacity projecting over the right lung base may be a nipple shadow. Heart is normal in size. Normal mediastinal contours. No pneumothorax or pleural fluid. Chronic compression fractures in the lower thoracic spine are similar to prior chest CT. IMPRESSION: 1. Left perihilar consolidation suspicious for pneumonia in the setting of fever. Followup PA and lateral chest X-ray is recommended in 3-4 weeks following trial of antibiotic therapy to ensure resolution and exclude underlying malignancy. 2. Small well-circumscribed nodular opacity projecting over the right lung base may be a nipple shadow, pulmonary nodule is not entirely excluded. Consider including nipple markers on follow-up exam. Electronically Signed   By: Jeb Levering M.D.   On: 08/31/2017 19:30    Procedures Procedures (including critical care time)  Medications Ordered in ED Medications  vancomycin (VANCOCIN) 1,250 mg in sodium chloride 0.9 % 250 mL IVPB (not administered)  ceFEPIme (MAXIPIME) 1 g in dextrose 5 % 50 mL IVPB (not administered)  potassium chloride 10 mEq in 100 mL IVPB (10 mEq Intravenous New Bag/Given 08/31/17 2116)  magnesium oxide (MAG-OX) tablet 800 mg (not administered)  potassium chloride SA (K-DUR,KLOR-CON) CR tablet 40 mEq (40 mEq Oral Given 08/31/17 2116)     Initial Impression /  Assessment  and Plan / ED Course  I have reviewed the triage vital signs and the nursing notes.  Pertinent labs & imaging results that were available during my care of the patient were reviewed by me and considered in my medical decision making (see chart for details).     66 yo M with a chief complaint of a fever. Patient found to have right lower lobe pneumonia. The patient is currently on oral chemo, will start on broad spectrum abx.   The patients results and plan were reviewed and discussed.   Any x-rays performed were independently reviewed by myself.   Differential diagnosis were considered with the presenting HPI.  Medications  vancomycin (VANCOCIN) 1,250 mg in sodium chloride 0.9 % 250 mL IVPB (not administered)  ceFEPIme (MAXIPIME) 1 g in dextrose 5 % 50 mL IVPB (not administered)  potassium chloride 10 mEq in 100 mL IVPB (10 mEq Intravenous New Bag/Given 08/31/17 2116)  magnesium oxide (MAG-OX) tablet 800 mg (not administered)  potassium chloride SA (K-DUR,KLOR-CON) CR tablet 40 mEq (40 mEq Oral Given 08/31/17 2116)    Vitals:   08/31/17 1858  BP: 125/75  Pulse: (!) 105  Resp: 20  Temp: (!) 101.4 F (38.6 C)  TempSrc: Oral  SpO2: 97%  Weight: 69.9 kg (154 lb)  Height: 5' 9" (1.753 m)    Final diagnoses:  HCAP (healthcare-associated pneumonia)    Admission/ observation were discussed with the admitting physician, patient and/or family and they are comfortable with the plan.    Final Clinical Impressions(s) / ED Diagnoses   Final diagnoses:  HCAP (healthcare-associated pneumonia)    New Prescriptions New Prescriptions   No medications on file     Deno Etienne, DO 08/31/17 2127

## 2017-08-31 NOTE — ED Notes (Signed)
Bed: WA20 Expected date:  Expected time:  Means of arrival:  Comments: Hold for triage 1 

## 2017-08-31 NOTE — H&P (Signed)
History and Physical    Eric Dudley:[id number] DOB: November 09, 1951 DOA: 08/31/2017  PCP: [name], MD Consultants:  Burr Medico - oncology; Norma Fredrickson - oncology at [facility]- cardiology; Sherre Lain - urology Patient coming from:  Home - lives with wife; [facility]: wife, [phone]  Chief Complaint: fever  HPI: Eric Dudley is a 66 y.o. male with medical history significant of tonsillar cancer (2006); multiple myeloma s/p bone marrow transplant (2016); HTN; HLD; CKD; and arthritis presenting with fever.  Friday, he went to his 2 year check up at [facility] after bone marrow transplant, received MMR vaccine.  Saturday to Monday he continued with feeling bad, thought it was a reaction to the shot.  He called [facility] today and they told him to stop taking Revlimid and take Tylenol and then he called Dr. Burr Medico who told him to come to the ER.  +cough since Friday.  Nonproductive cough.  +nausea, vomiting today.  +fever to 101-102, has been as high as 103.3 on Sunday.  No sick contacts.   ED Course: RLL PNA on CXR.  On oral chemo, started on broad spectrum abx.  Review of Systems: As per HPI; otherwise review of systems reviewed and negative.   Ambulatory Status:  Ambulates without assistance  Past Medical History:  Diagnosis Date  . Arthritis    neck and back pain  . Cholelithiases   . Chronic kidney disease    MILD,CHRONIC  . Hyperlipidemia   . Hypertension   . Multiple myeloma ([place])   . 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 APPENDECTOMY  1962  . CARDIAC CATHETERIZATION N/A 04/30/2015   Procedure: Left Heart Cath and Coronary Angiography;  Surgeon: Peter M Martinique, MD;  Location: [facility] INVASIVE CV LAB CUPID;  Service: Cardiovascular;  Laterality: N/A;  . CARDIOLITE MYOCARDIAL PERFUSION STUDY  04/16/03   NEGITIVE BRUCE PROTOCAL EXERCISE STRESS TEST. EF 70%. NO ISCHEMIA.  [name] KNEE SURGERY  2012   right  . SHOULDER SURGERY  1999   left     Social History   Social History  . Marital status: Married    Spouse name: N/A  . Number of children: N/A  . Years of education: N/A   Occupational History  . Retired    Social History Main Topics  . Smoking status: Former Smoker    Types: Cigars    Quit date: 12/29/2003  . Smokeless tobacco: Former Systems developer    Types: Chew    Quit date: 12/28/1994  . Alcohol use No     Comment: occasional beer  . Drug use: No  . Sexual activity: Not on file   Other Topics Concern  . Not on file   Social History Narrative  . No narrative on file    Allergies  Allergen Reactions  . Hydrochlorothiazide Rash and Palpitations  . Asa [Aspirin] Nausea Only  . Calcium Carbonate Nausea And Vomiting  . Oxycodone Other (See Comments)    Patient does not want to take. Severe Hallucinations.  . Amoxicillin Itching and Rash  . Indapamide Palpitations    Lightheadedness, dizziness    Family History  Problem Relation Age of Onset  . Cancer Mother 33       cervical cancer   . Cancer Father        unknown cancer   . Hypertension Sister   . Cancer Sister        melanoma     Prior to Admission medications  Medication Sig Start Date End Date Taking? Authorizing Provider  aspirin 81 MG chewable tablet Chew 1 tablet (81 mg total) by mouth daily. Patient taking differently: Chew 81 mg by mouth at bedtime.  05/01/15  Yes Caren Griffins, MD  calcium carbonate (OS-CAL) 600 MG TABS tablet Take 1,200 mg by mouth at bedtime.    Yes [provider]  Cholecalciferol (VITAMIN D3) 1000 units CAPS Take 2,000 mg by mouth daily.   Yes [provider]  lenalidomide (REVLIMID) 5 MG capsule Take 1 capsule (5 mg total) by mouth daily. Take Revlimid  5 mg by mouth daily  For  21 days ,  Rest  7 days. 08/09/17  Yes Truitt Merle, MD  Multiple Vitamins-Minerals (CENTRUM SILVER ULTRA MENS PO) Take 1 tablet by mouth at bedtime.    Yes [provider]  ondansetron (ZOFRAN) 4 MG tablet TAKE 1  TABLET FOUR TIMES A DAY AS NEEDED FOR NAUSEA OR VOMITING 06/15/17  Yes Truitt Merle, MD  potassium chloride (MICRO-K) 10 MEQ CR capsule TAKE 3 CAPSULES DAILY 06/15/17  Yes Truitt Merle, MD  ranitidine (ZANTAC) 150 MG tablet Take 2 tablets (300 mg total) by mouth at bedtime. 05/24/15  Yes Truitt Merle, MD  simvastatin (ZOCOR) 20 MG tablet Take 20 mg by mouth daily.   Yes [provider]  valACYclovir (VALTREX) 1000 MG tablet Take 1 tablet (1,000 mg total) by mouth daily. 05/14/17  Yes Truitt Merle, MD  Zoledronic Acid (ZOMETA) 4 MG/100ML IVPB Inject 4 mg into the vein. EVERY 60 DAYS   Yes [provider]  zolpidem (AMBIEN) 5 MG tablet Take 1 tablet (5 mg total) by mouth at bedtime as needed for sleep. 04/26/17  Yes Theodis Blaze, MD  nitroGLYCERIN (NITROSTAT) 0.4 MG SL tablet Place 0.4 mg under the tongue every 5 (five) minutes x 3 doses as needed for chest pain. Reported on 05/19/2016    [provider]    Physical Exam: Vitals:   08/31/17 1858 08/31/17 2201  BP: 125/75 127/72  Pulse: (!) 105 82  Resp: 20 18  Temp: (!) 101.4 F (38.6 C)   TempSrc: Oral   SpO2: 97% 98%  Weight: 69.9 kg (154 lb)   Height: _0  (1.753 m)      General:  Appears calm and comfortable and is NAD Eyes:  PERRL, EOMI, normal lids, iris ENT:  grossly normal hearing, lips & tongue, mmm; appropriate dentition Neck:  no LAD, masses or thyromegaly; no carotid bruits Cardiovascular:  RRR, no m/r/g. No LE edema.  Respiratory:   CTA bilaterally with no wheezes/rales/rhonchi.  Normal respiratory effort. Abdomen:  soft, NT, ND, NABS Back:   normal alignment, no CVAT Skin:  no rash or induration seen on limited exam Musculoskeletal:  grossly normal tone BUE/BLE, good ROM, no bony abnormality Psychiatric:  grossly normal mood and affect, speech fluent and appropriate, AOx3 Neurologic:  CN 2-12 grossly intact, moves all extremities in coordinated fashion, sensation intact    Radiological Exams on  Admission: Dg Chest 2 View  Result Date: 08/31/2017 CLINICAL DATA:  Intermittent fever for 3 days. Active chemotherapy for multiple myeloma. EXAM: CHEST  2 VIEW COMPARISON:  Chest CT 09/30/2015 FINDINGS: Left perihilar patchy opacity corresponds to superior segment of the left lower lobe suspicious for pneumonia. The right lung is clear. Small nodular opacity projecting over the right lung base may be a nipple shadow. Heart is normal in size. Normal mediastinal contours. No pneumothorax or pleural fluid. Chronic compression  fractures in the lower thoracic spine are similar to prior chest CT. IMPRESSION: 1. Left perihilar consolidation suspicious for pneumonia in the setting of fever. Followup PA and lateral chest X-ray is recommended in 3-4 weeks following trial of antibiotic therapy to ensure resolution and exclude underlying malignancy. 2. Small well-circumscribed nodular opacity projecting over the right lung base may be a nipple shadow, pulmonary nodule is not entirely excluded. Consider including nipple markers on follow-up exam. Electronically Signed   By: Jeb Levering M.D.   On: 08/31/2017 19:30    EKG: not done   Labs on Admission: I have personally reviewed the available labs and imaging studies at the time of the admission.  Pertinent labs:   Na++ 131, normal on 8/10 K+ 2.9 Creatinine 1.73/GFR 39; prior 1.4 on 8/10 AST 71/ALT 90; previously normal on 8/10 Lactate 2.04 WBC 7.1  Assessment/Plan Principal Problem:   Pneumonia Active Problems:   CKD (chronic kidney disease), stage II   Multiple myeloma in remission (HCC)   AKI (acute kidney injury) ([place])   Sepsis ([place])   Possible sepsis resulting from PNA in an immunocompromised patient -Fever, tachycardia with elevated lactate -While awaiting blood cultures, this appears to be a preseptic condition. -Sepsis protocol initiated -Given cough, fever to 103, and infiltrate in left perihilar region on chest x-ray , most likely  pneumonia.  -CURB-65 score is 1 - will admit the patient to Med Surg. -Pneumonia Severity Index (PSI) is Class 2, 0.6% mortality. -The patient is immunocompromised because he continues to take chemo and so it at risk for MDR (multi-drug resistance) infection; will treat with Cefepime and Vancomycin. -NS @ 75cc/hr -Fever control -Repeat CBC in am -Sputum cultures -Blood cultures -Strep pneumo testing -Will order procalcitonin level.  >0.5 indicates infection and >>0.5 indicates more serious disease.  As the procalcitonin level normalizes, it will be reasonable to consider de-escalation of antibiotic coverage. -Blood and urine cultures pending -Will admit and continue to monitor -Will trend lactate to ensure improvement  AKI on CKD -Mild pre-renal azotemia associated with increased volume demands in the setting of infection -Hyponatremia is presumed associated with this -Will recheck labs in AM following ongoing overnight hydration  Multiple myeloma -Patient is currently on maintenance Revlimid for multiple myeloma which is in remission after autologous stem cell transplant in August 2016; will hold due to acute infection -He is on Valacyclovir for herpes suppression  DVT prophylaxis: Lovenox  Code Status:  Full - confirmed with patient/family Family Communication: Wife present throughout evalaution  Disposition Plan:  Home once clinically improved Consults called: Dr. Burr Medico has been added to the treatment team Admission status: Admit - It is my clinical opinion that admission to INPATIENT is reasonable and necessary because this patient will require at least 2 midnights in the hospital to treat this condition based on the medical complexity of the problems presented.  Given the aforementioned information, the predictability of an adverse outcome is felt to be significant.    Karmen Bongo MD [facility]  If note is complete, please contact covering daytime or nighttime  physician. [email or web address] Password [facility]  08/31/2017, 10:24 PM

## 2017-08-31 NOTE — ED Notes (Signed)
Call report but nurse unavailable at this time. Nurse will call me back soon.

## 2017-09-01 DIAGNOSIS — E876 Hypokalemia: Secondary | ICD-10-CM

## 2017-09-01 DIAGNOSIS — C9001 Multiple myeloma in remission: Secondary | ICD-10-CM

## 2017-09-01 DIAGNOSIS — N179 Acute kidney failure, unspecified: Secondary | ICD-10-CM

## 2017-09-01 DIAGNOSIS — J189 Pneumonia, unspecified organism: Secondary | ICD-10-CM

## 2017-09-01 LAB — BASIC METABOLIC PANEL
Anion gap: 7 (ref 5–15)
BUN: 12 mg/dL (ref 6–20)
CO2: 19 mmol/L — ABNORMAL LOW (ref 22–32)
Calcium: 7.9 mg/dL — ABNORMAL LOW (ref 8.9–10.3)
Chloride: 110 mmol/L (ref 101–111)
Creatinine, Ser: 1.5 mg/dL — ABNORMAL HIGH (ref 0.61–1.24)
GFR calc Af Amer: 53 mL/min — ABNORMAL LOW (ref >60)
GFR, EST NON AFRICAN AMERICAN: 46 mL/min — AB (ref >60)
GLUCOSE: 101 mg/dL — AB (ref 65–99)
Potassium: 2.9 mmol/L — ABNORMAL LOW (ref 3.5–5.1)
Sodium: 136 mmol/L (ref 135–145)

## 2017-09-01 LAB — CBC WITH DIFFERENTIAL/PLATELET
BASOS ABS: 0 10*3/uL (ref 0.0–0.1)
Basophils Relative: 0 %
EOS PCT: 0 %
Eosinophils Absolute: 0 10*3/uL (ref 0.0–0.7)
HEMATOCRIT: 42.2 % (ref 39.0–52.0)
Hemoglobin: 15 g/dL (ref 13.0–17.0)
LYMPHS PCT: 20 %
Lymphs Abs: 1.2 10*3/uL (ref 0.7–4.0)
MCH: 36.5 pg — AB (ref 26.0–34.0)
MCHC: 35.5 g/dL (ref 30.0–36.0)
MCV: 102.7 fL — AB (ref 78.0–100.0)
MONO ABS: 0.7 10*3/uL (ref 0.1–1.0)
MONOS PCT: 12 %
Neutro Abs: 4.3 10*3/uL (ref 1.7–7.7)
Neutrophils Relative %: 68 %
Platelets: 145 10*3/uL — ABNORMAL LOW (ref 150–400)
RBC: 4.11 MIL/uL — ABNORMAL LOW (ref 4.22–5.81)
RDW: 13.5 % (ref 11.5–15.5)
WBC: 6.3 10*3/uL (ref 4.0–10.5)

## 2017-09-01 LAB — STREP PNEUMONIAE URINARY ANTIGEN: STREP PNEUMO URINARY ANTIGEN: NEGATIVE

## 2017-09-01 LAB — LACTIC ACID, PLASMA
LACTIC ACID, VENOUS: 2 mmol/L — AB (ref 0.5–1.9)
Lactic Acid, Venous: 0.8 mmol/L (ref 0.5–1.9)

## 2017-09-01 LAB — MAGNESIUM: MAGNESIUM: 1.7 mg/dL (ref 1.7–2.4)

## 2017-09-01 LAB — PROCALCITONIN: PROCALCITONIN: 0.3 ng/mL

## 2017-09-01 MED ORDER — ENSURE ENLIVE PO LIQD: 237.0000 mL | Freq: Two times a day (BID) | ORAL | Status: DC

## 2017-09-01 MED ORDER — VANCOMYCIN HCL 500 MG IV SOLR
500.0000 mg | Freq: Two times a day (BID) | INTRAVENOUS | Status: DC
  Administered 2017-09-01 – 2017-09-02 (×3): 500 mg via INTRAVENOUS
  Filled 2017-09-01 (×4): qty 500

## 2017-09-01 MED ORDER — DEXTROSE 5 % IV SOLN
1.0000 g | Freq: Three times a day (TID) | INTRAVENOUS | Status: DC
  Administered 2017-09-01 – 2017-09-02 (×4): 1 g via INTRAVENOUS
  Filled 2017-09-01 (×6): qty 1

## 2017-09-01 MED ORDER — SODIUM CHLORIDE 0.9 % IV BOLUS (SEPSIS)
250.0000 mL | Freq: Once | INTRAVENOUS | Status: AC
  Administered 2017-09-01: 250 mL via INTRAVENOUS

## 2017-09-01 MED ORDER — POTASSIUM CHLORIDE CRYS ER 20 MEQ PO TBCR
40.0000 meq | EXTENDED_RELEASE_TABLET | Freq: Once | ORAL | Status: AC
  Administered 2017-09-01: 40 meq via ORAL
  Filled 2017-09-01: qty 2

## 2017-09-01 NOTE — Progress Notes (Signed)
CRITICAL VALUE ALERT  Critical Value:  Lactic Acid 2.0  Date & Time Notied:  09-01-2017 @0044   Provider Notified: On Call Dr. Myna Hidalgo notified via text  Orders Received/Actions taken: none at this time

## 2017-09-01 NOTE — Progress Notes (Signed)
Rapid Response called to bedside due to MD order on admission for RRT nurse to be called.  Pt alert and oriented x 4, resting in bed, no distress noted, Lunds clear bilat., VS all stable, not tachycardic.  Pt admitted for Sepsis with PNA.  Lactic Acid was 2.4 in the ED.  Last Lactic Acid was trending down to 2.0, with a repeat Lactic ordered in 3 hours.  Pt is stable at this time from a rapid response stand point.  If any deterioration, change, or concern in patient's condition, please call the rapid response nurse at [phone] for the [facility].  [facility] ICU/SD Care Coordinator / Rapid Response Nurse

## 2017-09-01 NOTE — Progress Notes (Signed)
RRT RN at bedside

## 2017-09-01 NOTE — Progress Notes (Signed)
Pharmacy Antibiotic Note  Eric Dudley is a 66 y.o. male c/o fever admitted on 08/31/2017 with pneumonia.  Pharmacy has been consulted for cefepime and vancomycin dosing.  Plan: Cefepime 1 Gm I V q8h Vancomycin 1250 mg x1 ED then 500 mg IV q12h VT=15-20 mg/L F/u scr/cultures/levels   Height: 5\' 9"  (175.3 cm) Weight: 154 lb (69.9 kg) IBW/kg (Calculated) : 70.7  Temp (24hrs), Avg:101.3 F (38.5 C), Min:101.1 F (38.4 C), Max:101.5 F (38.6 C)   Recent Labs Lab 08/31/17 2003 08/31/17 2012  WBC  --  7.1  CREATININE  --  1.73*  LATICACIDVEN 2.04*  --     Estimated Creatinine Clearance: 40.4 mL/min (A) (by C-G formula based on SCr of 1.73 mg/dL (H)).    Allergies  Allergen Reactions  . Hydrochlorothiazide Rash and Palpitations  . Asa [Aspirin] Nausea Only  . Calcium Carbonate Nausea And Vomiting  . Oxycodone Other (See Comments)    Patient does not want to take. Severe Hallucinations.  . Amoxicillin Itching and Rash  . Indapamide Palpitations    Lightheadedness, dizziness    Antimicrobials this admission: 9/4 cefepime >>  9/4 vancomycin >>   Dose adjustments this admission:   Microbiology results:  BCx:   UCx:    Sputum:    MRSA PCR:   Thank you for allowing pharmacy to be a part of this patient's care.  Lawana Pai R 09/01/2017 12:00 AM

## 2017-09-01 NOTE — Progress Notes (Signed)
Eric Dudley   DOB:September 30, 1951   QM#:[id number]   DTO#:[id number]  Oncology follow up   Subjective: Patient is well-known to me, under my care for his multiple myeloma. He had stem cell transplant at [facility], currently on maintenance Revlimid. He developed persistent fever after his vaccine shots at [facility] last Friday, and I recommended him he to come in to ED yesterday. He was admitted for pneumonia. He feels better today, fever is resolving    Objective:  Vitals:   09/01/17 1430 09/01/17 1948  BP: 129/62 136/69  Pulse: 64 79  Resp: 18 18  Temp: 99.6 F (37.6 C) 99.4 F (37.4 C)  SpO2: 100% 99%    Body mass index is 22.74 kg/m.  Intake/Output Summary (Last 24 hours) at 09/01/17 2304 Last data filed at 09/01/17 2147  Gross per 24 hour  Intake             1785 ml  Output             3250 ml  Net            -1465 ml     Sclerae unicteric  Oropharynx clear  No peripheral adenopathy  Lungs clear -- no rales or rhonchi  Heart regular rate and rhythm  Abdomen benign  MSK no focal spinal tenderness, no peripheral edema  Neuro nonfocal   CBG (last 3)  No results for input(s): GLUCAP in the last 72 hours.   Labs:  Urine Studies No results for input(s): UHGB, CRYS in the last 72 hours.  Invalid input(s): UACOL, UAPR, USPG, UPH, UTP, UGL, UKET, UBIL, UNIT, UROB, ULEU, UEPI, UWBC, URBC, UBAC, CAST, UCOM, BILUA  Basic Metabolic Panel:  Recent Labs Lab 08/31/17 2012 09/01/17 0230  NA 131* 136  K 2.9* 2.9*  CL 100* 110  CO2 20* 19*  GLUCOSE 97 101*  BUN 14 12  CREATININE 1.73* 1.50*  CALCIUM 8.6* 7.9*  MG  --  1.7   GFR Estimated Creatinine Clearance: 46.6 mL/min (A) (by C-G formula based on SCr of 1.5 mg/dL (H)). Liver Function Tests:  Recent Labs Lab 08/31/17 2012  AST 71*  ALT 90*  ALKPHOS 114  BILITOT 0.6  PROT 7.5  ALBUMIN 3.6   No results for input(s): LIPASE, AMYLASE in the last 168 hours. No results for input(s): AMMONIA in the last 168  hours. Coagulation profile  Recent Labs Lab 08/31/17 2012  INR 0.99    CBC:  Recent Labs Lab 08/31/17 2012 09/01/17 0230  WBC 7.1 6.3  NEUTROABS 4.9 4.3  HGB 17.0 15.0  HCT 47.8 42.2  MCV 103.2* 102.7*  PLT 158 145*   Cardiac Enzymes: No results for input(s): CKTOTAL, CKMB, CKMBINDEX, TROPONINI in the last 168 hours. BNP: Invalid input(s): POCBNP CBG: No results for input(s): GLUCAP in the last 168 hours. D-Dimer No results for input(s): DDIMER in the last 72 hours. Hgb A1c No results for input(s): HGBA1C in the last 72 hours. Lipid Profile No results for input(s): CHOL, HDL, LDLCALC, TRIG, CHOLHDL, LDLDIRECT in the last 72 hours. Thyroid function studies No results for input(s): TSH, T4TOTAL, T3FREE, THYROIDAB in the last 72 hours.  Invalid input(s): FREET3 Anemia work up No results for input(s): VITAMINB12, FOLATE, FERRITIN, TIBC, IRON, RETICCTPCT in the last 72 hours. Microbiology Recent Results (from the past 240 hour(s))  Culture, blood (Routine x 2)     Status: None (Preliminary result)   Collection Time: 08/31/17  7:40 PM  Result Value Ref Range Status  Specimen Description BLOOD RIGHT HAND  Final   Special Requests IN PEDIATRIC BOTTLE Blood Culture adequate volume  Final   Culture   Final    NO GROWTH < 12 HOURS Performed at [facility], [place] [address] [place], [place] [address]    Report Status PENDING  Incomplete  Culture, blood (Routine x 2)     Status: None (Preliminary result)   Collection Time: 08/31/17  7:47 PM  Result Value Ref Range Status   Specimen Description BLOOD LEFT FOREARM  Final   Special Requests   Final    BOTTLES DRAWN AEROBIC AND ANAEROBIC Blood Culture adequate volume   Culture   Final    NO GROWTH < 12 HOURS Performed at [facility], [place] [address] [place],  [address]    Report Status PENDING  Incomplete      Studies:  Dg Chest 2 View  Result Date: 08/31/2017 CLINICAL DATA:  Intermittent fever  for 3 days. Active chemotherapy for multiple myeloma. EXAM: CHEST  2 VIEW COMPARISON:  Chest CT 09/30/2015 FINDINGS: Left perihilar patchy opacity corresponds to superior segment of the left lower lobe suspicious for pneumonia. The right lung is clear. Small nodular opacity projecting over the right lung base may be a nipple shadow. Heart is normal in size. Normal mediastinal contours. No pneumothorax or pleural fluid. Chronic compression fractures in the lower thoracic spine are similar to prior chest CT. IMPRESSION: 1. Left perihilar consolidation suspicious for pneumonia in the setting of fever. Followup PA and lateral chest X-ray is recommended in 3-4 weeks following trial of antibiotic therapy to ensure resolution and exclude underlying malignancy. 2. Small well-circumscribed nodular opacity projecting over the right lung base may be a nipple shadow, pulmonary nodule is not entirely excluded. Consider including nipple markers on follow-up exam. Electronically Signed   By: Jeb Levering M.D.   On: 08/31/2017 19:30    Assessment: 66 y.o. with multiple myeloma, s/p stem cell transplant at [facility], currently on maintenance Revlimid, presented with fever and cough.  1. Left pneumonia 2. Multiple myeloma in remission, status post stem cell transplant, on maintenance Revlimid 3.  Acute on chronic renal disease  4.sepsis   Plan: -Patient is immunocompromised due to his Revlimid treatment -I agree with broad antibiotics, his cultures are pending -He is clinically improving, hopefully he can be discharged with oral antibiotics a few days. -We'll hold his Revlimid for now, until he sees her back in 2 weeks. -appreciate the excellent care form the hospitalist team    Truitt Merle, MD 09/01/2017  11:04 PM

## 2017-09-01 NOTE — Progress Notes (Signed)
Orders to call RRT RN re: pt. RRT RN notified at 11. RRT RN currently out on another RRT call.  Will follow up shortly.

## 2017-09-01 NOTE — Progress Notes (Signed)
[facility] PROGRESS NOTE  Eric Dudley:[id number] DOB: 09/15/1951 DOA: 08/31/2017  PCP: [name], MD  Brief History/Interval Summary: 66 year old Caucasian male with a past medical history of tonsillar cancer in 2006, multiple myeloma status post bone marrow transplant in 2016 at [facility], hypertension, hyperlipidemia, presented with fever. On Friday prior to admission, he had gone to [facility] for follow-up and received MMR vaccination. Subsequently, developed fever. He called his outpatient providers and was asked to come into the hospital. Chest x-ray raised concern for pneumonia. Patient was started on broad-spectrum antibiotics.  Reason for Visit: Right lower lobe pneumonia  Consultants: Oncology notified  Procedures: None  Antibiotics: Vancomycin and cefepime  Subjective/Interval History: Patient states that he is feeling better this morning. Continues to have cough which is dry for the most part. Denies any shortness of breath. No chest pain.  ROS: No nausea or vomiting  Objective:  Vital Signs  Vitals:   08/31/17 2347 09/01/17 0100 09/01/17 0432 09/01/17 0539  BP:   138/61   Pulse:   80   Resp:   18   Temp:  100.1 F (37.8 C) 100 F (37.8 C) (!) 100.6 F (38.1 C)  TempSrc:  Oral Oral Oral  SpO2:   97%   Weight: 69.9 kg (154 lb)     Height: 5' 9" (1.753 m)       Intake/Output Summary (Last 24 hours) at 09/01/17 1223 Last data filed at 09/01/17 1007  Gross per 24 hour  Intake             1425 ml  Output             2750 ml  Net            -1325 ml   Filed Weights   08/31/17 1858 08/31/17 2340 08/31/17 2347  Weight: 69.9 kg (154 lb) 69.9 kg (154 lb) 69.9 kg (154 lb)    General appearance: alert, cooperative, appears stated age and no distress Head: Normocephalic, without obvious abnormality, atraumatic Resp: Diminished air entry at the right base, but no definite crackles, wheezing or rhonchi. Normal  effort. Cardio: regular rate and rhythm, S1, S2 normal, no murmur, click, rub or gallop GI: soft, non-tender; bowel sounds normal; no masses,  no organomegaly Extremities: extremities normal, atraumatic, no cyanosis or edema Neurologic: No obvious focal neurological deficits.  Lab Results:  Data Reviewed: I have personally reviewed following labs and imaging studies  CBC:  Recent Labs Lab 08/31/17 2012 09/01/17 0230  WBC 7.1 6.3  NEUTROABS 4.9 4.3  HGB 17.0 15.0  HCT 47.8 42.2  MCV 103.2* 102.7*  PLT 158 145*    Basic Metabolic Panel:  Recent Labs Lab 08/31/17 2012 09/01/17 0230  NA 131* 136  K 2.9* 2.9*  CL 100* 110  CO2 20* 19*  GLUCOSE 97 101*  BUN 14 12  CREATININE 1.73* 1.50*  CALCIUM 8.6* 7.9*    GFR: Estimated Creatinine Clearance: 46.6 mL/min (A) (by C-G formula based on SCr of 1.5 mg/dL (H)).  Liver Function Tests:  Recent Labs Lab 08/31/17 2012  AST 71*  ALT 90*  ALKPHOS 114  BILITOT 0.6  PROT 7.5  ALBUMIN 3.6    Coagulation Profile:  Recent Labs Lab 08/31/17 2012  INR 0.99     Recent Results (from the past 240 hour(s))  Culture, blood (Routine x 2)     Status: None (Preliminary result)   Collection Time: 08/31/17  7:40 PM  Result Value Ref Range Status   Specimen Description BLOOD RIGHT HAND  Final   Special Requests IN PEDIATRIC BOTTLE Blood Culture adequate volume  Final   Culture   Final    NO GROWTH < 12 HOURS Performed at [facility], [address], [place], [place] [address]    Report Status PENDING  Incomplete  Culture, blood (Routine x 2)     Status: None (Preliminary result)   Collection Time: 08/31/17  7:47 PM  Result Value Ref Range Status   Specimen Description BLOOD LEFT FOREARM  Final   Special Requests   Final    BOTTLES DRAWN AEROBIC AND ANAEROBIC Blood Culture adequate volume   Culture   Final    NO GROWTH < 12 HOURS Performed at [facility], [place] [address] [place], [place] [address]     Report Status PENDING  Incomplete      Radiology Studies: Dg Chest 2 View  Result Date: 08/31/2017 CLINICAL DATA:  Intermittent fever for 3 days. Active chemotherapy for multiple myeloma. EXAM: CHEST  2 VIEW COMPARISON:  Chest CT 09/30/2015 FINDINGS: Left perihilar patchy opacity corresponds to superior segment of the left lower lobe suspicious for pneumonia. The right lung is clear. Small nodular opacity projecting over the right lung base may be a nipple shadow. Heart is normal in size. Normal mediastinal contours. No pneumothorax or pleural fluid. Chronic compression fractures in the lower thoracic spine are similar to prior chest CT. IMPRESSION: 1. Left perihilar consolidation suspicious for pneumonia in the setting of fever. Followup PA and lateral chest X-ray is recommended in 3-4 weeks following trial of antibiotic therapy to ensure resolution and exclude underlying malignancy. 2. Small well-circumscribed nodular opacity projecting over the right lung base may be a nipple shadow, pulmonary nodule is not entirely excluded. Consider including nipple markers on follow-up exam. Electronically Signed   By: Jeb Levering M.D.   On: 08/31/2017 19:30     Medications:  Scheduled: . aspirin  81 mg Oral QHS  . enoxaparin (LOVENOX) injection  40 mg Subcutaneous QHS  . famotidine  20 mg Oral QHS  . feeding supplement (ENSURE ENLIVE)  237 mL Oral BID BM  . potassium chloride  30 mEq Oral Daily  . simvastatin  20 mg Oral q1800  . valACYclovir  1,000 mg Oral Daily   Continuous: . ceFEPime (MAXIPIME) IV 1 g (09/01/17 0617)  . vancomycin Stopped (09/01/17 1102)   IRC:[id number]  Assessment/Plan:  Principal Problem:   Pneumonia Active Problems:   CKD (chronic kidney disease), stage II   Multiple myeloma in remission ([place])   AKI (acute kidney injury) ([place])   Sepsis ([place])    Sepsis resulting from pneumonia in an immunocompromised patient. Patient did have fever, tachycardia with elevated  lactic acid level at admission. Sepsis physiology has resolved. Continues to have fever. Await blood cultures. Continue vancomycin and cefepime. Pro-calcitonin level was not noted to be that elevated. Cut back on IV fluids. Patient feels better this morning.  Acute kidney injury on chronic kidney disease, stage II. Creatinine shows improvement today. Continue to monitor. Baseline creatinine around 1.3-1.4.  Hyponatremia Sodium levels improved this morning. Cut back on IV fluids.  Hypokalemia. This will be repleted. Check magnesium  History of multiple myeloma. Patient is status post bone marrow transplant at [facility]. He is also followed by Dr. Burr Medico at [facility]. He is on Revlimid for multiple myeloma which is being held due to his  infection. He is also on valacyclovir for herpes suppression.  DVT Prophylaxis: Lovenox    Code Status: Full code  Family Communication: Discussed with the patient and his wife  Disposition Plan: Management as outlined above.    LOS: 1 day   [facility] Pager [phone] 09/01/2017, 12:23 PM  If 7PM-7AM, please contact night-coverage at [email or web address], password [facility]

## 2017-09-02 LAB — BASIC METABOLIC PANEL
Anion gap: 9 (ref 5–15)
BUN: 10 mg/dL (ref 6–20)
CHLORIDE: 109 mmol/L (ref 101–111)
CO2: 18 mmol/L — ABNORMAL LOW (ref 22–32)
CREATININE: 1.39 mg/dL — AB (ref 0.61–1.24)
Calcium: 8.1 mg/dL — ABNORMAL LOW (ref 8.9–10.3)
GFR calc Af Amer: 59 mL/min — ABNORMAL LOW (ref >60)
GFR calc non Af Amer: 51 mL/min — ABNORMAL LOW (ref >60)
Glucose, Bld: 96 mg/dL (ref 65–99)
Potassium: 2.9 mmol/L — ABNORMAL LOW (ref 3.5–5.1)
SODIUM: 136 mmol/L (ref 135–145)

## 2017-09-02 LAB — CBC
HCT: 44.2 % (ref 39.0–52.0)
HEMOGLOBIN: 16 g/dL (ref 13.0–17.0)
MCH: 36.9 pg — ABNORMAL HIGH (ref 26.0–34.0)
MCHC: 36.2 g/dL — AB (ref 30.0–36.0)
MCV: 101.8 fL — ABNORMAL HIGH (ref 78.0–100.0)
Platelets: 142 10*3/uL — ABNORMAL LOW (ref 150–400)
RBC: 4.34 MIL/uL (ref 4.22–5.81)
RDW: 13.5 % (ref 11.5–15.5)
WBC: 5 10*3/uL (ref 4.0–10.5)

## 2017-09-02 LAB — MAGNESIUM: MAGNESIUM: 1.9 mg/dL (ref 1.7–2.4)

## 2017-09-02 MED ORDER — POTASSIUM CHLORIDE CRYS ER 20 MEQ PO TBCR
40.0000 meq | EXTENDED_RELEASE_TABLET | ORAL | Status: AC
  Administered 2017-09-02 (×3): 40 meq via ORAL
  Filled 2017-09-02 (×3): qty 2

## 2017-09-02 MED ORDER — ONDANSETRON 4 MG PO TBDP
4.0000 mg | ORAL_TABLET | Freq: Three times a day (TID) | ORAL | Status: DC | PRN
  Administered 2017-09-02: 4 mg via ORAL
  Filled 2017-09-02 (×2): qty 1

## 2017-09-02 MED ORDER — ONDANSETRON HCL 4 MG/2ML IJ SOLN: 4.0000 mg | Freq: Four times a day (QID) | INTRAMUSCULAR | Status: DC | PRN

## 2017-09-02 MED ORDER — LEVOFLOXACIN 750 MG PO TABS
750.0000 mg | ORAL_TABLET | Freq: Every day | ORAL | Status: DC
  Administered 2017-09-02 – 2017-09-03 (×2): 750 mg via ORAL
  Filled 2017-09-02 (×2): qty 1

## 2017-09-02 NOTE — Progress Notes (Signed)
Nutrition Brief Note  Patient identified on the Malnutrition Screening Tool (MST) Report  Patient with stable weight since May 2018. Eating 100%.  Wt Readings from Last 15 Encounters:  08/31/17 154 lb (69.9 kg)  08/06/17 159 lb 6.4 oz (72.3 kg)  06/25/17 155 lb 14.4 oz (70.7 kg)  05/14/17 152 lb 3.2 oz (69 kg)  04/22/17 150 lb (68 kg)  03/18/17 149 lb 11.2 oz (67.9 kg)  12/18/16 147 lb 6.4 oz (66.9 kg)  12/08/16 146 lb (66.2 kg)  09/18/16 149 lb 11.2 oz (67.9 kg)  06/26/16 147 lb 9.6 oz (67 kg)  05/27/16 150 lb (68 kg)  05/19/16 150 lb 1.6 oz (68.1 kg)  04/21/16 146 lb 12.8 oz (66.6 kg)  03/24/16 147 lb 4.8 oz (66.8 kg)  02/17/16 147 lb (66.7 kg)    Body mass index is 22.74 kg/m. Patient meets criteria for normal based on current BMI.   Current diet order is regular, patient is consuming approximately 100% of meals at this time. Labs and medications reviewed.   No nutrition interventions warranted at this time. If nutrition issues arise, please consult RD.   Clayton Bibles, MS, RD, LDN Pager: [phone] After Hours Pager: [phone]

## 2017-09-02 NOTE — Progress Notes (Signed)
[facility] PROGRESS NOTE  Eric Dudley HEN:[id number] DOB: [birth year] DOA: 08/31/2017  PCP: [name], MD  Brief History/Interval Summary: 66 year old Caucasian male with a past medical history of tonsillar cancer in 2006, multiple myeloma status post bone marrow transplant in 2016 at [facility], hypertension, hyperlipidemia, presented with fever. On Friday prior to admission, he had gone to [facility] for follow-up and received MMR vaccination. Subsequently, developed fever. He called his outpatient providers and was asked to come into the hospital. Chest x-ray raised concern for pneumonia. Patient was started on broad-spectrum antibiotics.  Reason for Visit: Right lower lobe pneumonia  Consultants: Oncology  Procedures: None  Antibiotics: Vancomycin and cefepime. These will be changed over to Levaquin today.  Subjective/Interval History: Patient feels well. States that his appetite has improved. Denies any difficulty breathing. Cough is improving.   ROS: No nausea or vomiting  Objective:  Vital Signs  Vitals:   09/01/17 1430 09/01/17 1948 09/01/17 2300 09/02/17 0456  BP: 129/62 136/69 117/64 (!) 142/58  Pulse: 64 79 75 61  Resp: _0 Temp: 99.6 F (37.6 C) 99.4 F (37.4 C) 99.3 F (37.4 C) 98.2 F (36.8 C)  TempSrc:  Oral Oral Oral  SpO2: 100% 99% 98% 99%  Weight:      Height:        Intake/Output Summary (Last 24 hours) at 09/02/17 0846 Last data filed at 09/02/17 0050  Gross per 24 hour  Intake              510 ml  Output             1200 ml  Net             -690 ml   Filed Weights   08/31/17 1858 08/31/17 2340 08/31/17 2347  Weight: 69.9 kg (154 lb) 69.9 kg (154 lb) 69.9 kg (154 lb)    General appearance: Awake, alert. In no distress Resp: Normal effort. Improved air entry bilaterally. No definite crackles, wheezing or rhonchi. Cardio: S1, S2 is normal, regular. No S3, S4. No rubs, murmurs or  bruits GI: Abdomen is soft. Nontender, nondistended. Bowel sounds are present. No masses or organomegaly Extremities: No edema Neurologic: No obvious focal neurological deficits.  Lab Results:  Data Reviewed: I have personally reviewed following labs and imaging studies  CBC:  Recent Labs Lab 08/31/17 2012 09/01/17 0230 09/02/17 0345  WBC 7.1 6.3 5.0  NEUTROABS 4.9 4.3  --   HGB 17.0 15.0 16.0  HCT 47.8 42.2 44.2  MCV 103.2* 102.7* 101.8*  PLT 158 145* 142*    Basic Metabolic Panel:  Recent Labs Lab 08/31/17 2012 09/01/17 0230 09/02/17 0345  NA 131* 136 136  K 2.9* 2.9* 2.9*  CL 100* 110 109  CO2 20* 19* 18*  GLUCOSE 97 101* 96  BUN _1 CREATININE 1.73* 1.50* 1.39*  CALCIUM 8.6* 7.9* 8.1*  MG  --  1.7 1.9    GFR: Estimated Creatinine Clearance: 50.3 mL/min (A) (by C-G formula based on SCr of 1.39 mg/dL (H)).  Liver Function Tests:  Recent Labs Lab 08/31/17 2012  AST 71*  ALT 90*  ALKPHOS 114  BILITOT 0.6  PROT 7.5  ALBUMIN 3.6    Coagulation Profile:  Recent Labs Lab 08/31/17 2012  INR 0.99     Recent Results (from the past 240 hour(s))  Culture, blood (Routine x 2)     Status: None (Preliminary result)  Collection Time: 08/31/17  7:40 PM  Result Value Ref Range Status   Specimen Description BLOOD RIGHT HAND  Final   Special Requests IN PEDIATRIC BOTTLE Blood Culture adequate volume  Final   Culture   Final    NO GROWTH < 12 HOURS Performed at [facility], [place] [address], [place], [place] [address]    Report Status PENDING  Incomplete  Culture, blood (Routine x 2)     Status: None (Preliminary result)   Collection Time: 08/31/17  7:47 PM  Result Value Ref Range Status   Specimen Description BLOOD LEFT FOREARM  Final   Special Requests   Final    BOTTLES DRAWN AEROBIC AND ANAEROBIC Blood Culture adequate volume   Culture   Final    NO GROWTH < 12 HOURS Performed at [facility], [place] [address] [place],  [place] [address]    Report Status PENDING  Incomplete      Radiology Studies: Dg Chest 2 View  Result Date: 08/31/2017 CLINICAL DATA:  Intermittent fever for 3 days. Active chemotherapy for multiple myeloma. EXAM: CHEST  2 VIEW COMPARISON:  Chest CT 09/30/2015 FINDINGS: Left perihilar patchy opacity corresponds to superior segment of the left lower lobe suspicious for pneumonia. The right lung is clear. Small nodular opacity projecting over the right lung base may be a nipple shadow. Heart is normal in size. Normal mediastinal contours. No pneumothorax or pleural fluid. Chronic compression fractures in the lower thoracic spine are similar to prior chest CT. IMPRESSION: 1. Left perihilar consolidation suspicious for pneumonia in the setting of fever. Followup PA and lateral chest X-ray is recommended in 3-4 weeks following trial of antibiotic therapy to ensure resolution and exclude underlying malignancy. 2. Small well-circumscribed nodular opacity projecting over the right lung base may be a nipple shadow, pulmonary nodule is not entirely excluded. Consider including nipple markers on follow-up exam. Electronically Signed   By: Jeb Levering M.D.   On: 08/31/2017 19:30     Medications:  Scheduled: . aspirin  81 mg Oral QHS  . enoxaparin (LOVENOX) injection  40 mg Subcutaneous QHS  . famotidine  20 mg Oral QHS  . feeding supplement (ENSURE ENLIVE)  237 mL Oral BID BM  . potassium chloride  40 mEq Oral Q4H  . simvastatin  20 mg Oral q1800  . valACYclovir  1,000 mg Oral Daily   Continuous: . ceFEPime (MAXIPIME) IV Stopped (09/02/17 9924)  . vancomycin Stopped (09/02/17 0050)   QAS:[id number]  Assessment/Plan:  Principal Problem:   Pneumonia Active Problems:   CKD (chronic kidney disease), stage II   Multiple myeloma in remission ([place])   AKI (acute kidney injury) ([place])   Sepsis ([place])    Sepsis resulting from pneumonia in an immunocompromised patient. Patient did have fever, tachycardia  with elevated lactic acid level at admission. Sepsis physiology has resolved. Seems to be improving. Fever appears to be abating. Cultures negative. We will change him over to oral Levaquin. Pro-calcitonin level was not noted to be that elevated.   Acute kidney injury on chronic kidney disease, stage II. Creatinine continues to improve. Baseline creatinine around 1.3-1.4. Monitor urine output.  Hyponatremia Resolved with IV fluids.   Hypokalemia. Magnesium was normal. Potassium level remains low. This will be aggressively repleted. Recheck tomorrow morning.  History of multiple myeloma. Patient is status post bone marrow transplant at [facility]. He is also followed by Dr. Burr Medico at [facility]. He was on Revlimid for  multiple myeloma which is being held due to his infection. He is also on valacyclovir for herpes suppression.  DVT Prophylaxis: Lovenox    Code Status: Full code  Family Communication: Discussed with the patient Disposition Plan: Mobilize. Change to oral antibiotics today. Anticipate discharge tomorrow.    LOS: 2 days   [facility] [facility] Pager [phone] 09/02/2017, 8:46 AM  If 7PM-7AM, please contact night-coverage at [email or web address], password [facility]

## 2017-09-02 NOTE — Progress Notes (Signed)
Pharmacy Antibiotic Note  Eric Dudley is a 66 y.o. male c/o fever admitted on 08/31/2017 with pneumonia.The pt was started on cefepime and vancomycin, now pharmacy consulted to dose po levaquin.  Scr 1.39, CrCl ~ 50.43mls/min  Plan: levaquin 750mg  po daily  Height: 5\' 9"  (175.3 cm) Weight: 154 lb (69.9 kg) IBW/kg (Calculated) : 70.7  Temp (24hrs), Avg:99.1 F (37.3 C), Min:98.2 F (36.8 C), Max:99.6 F (37.6 C)   Recent Labs Lab 08/31/17 2003 08/31/17 2012 08/31/17 2358 09/01/17 0230 09/02/17 0345  WBC  --  7.1  --  6.3 5.0  CREATININE  --  1.73*  --  1.50* 1.39*  LATICACIDVEN 2.04*  --  2.0* 0.8  --     Estimated Creatinine Clearance: 50.3 mL/min (A) (by C-G formula based on SCr of 1.39 mg/dL (H)).    Allergies  Allergen Reactions  . Hydrochlorothiazide Rash and Palpitations  . Asa [Aspirin] Nausea Only  . Calcium Carbonate Nausea And Vomiting  . Oxycodone Other (See Comments)    Patient does not want to take. Severe Hallucinations.  . Amoxicillin Itching and Rash  . Indapamide Palpitations    Lightheadedness, dizziness    Antimicrobials this admission: 9/4 cefepime >> 9/6 9/4 vancomycin >> 9/6  Dose adjustments this admission:   Microbiology results:  BCx:   UCx:    Sputum:    MRSA PCR:   Thank you for allowing pharmacy to be a part of this patient's care.  Dolly Rias RPh 09/02/2017, 1:16 PM Pager [phone]

## 2017-09-03 LAB — BASIC METABOLIC PANEL
Anion gap: 7 (ref 5–15)
BUN: 9 mg/dL (ref 6–20)
CHLORIDE: 109 mmol/L (ref 101–111)
CO2: 20 mmol/L — ABNORMAL LOW (ref 22–32)
CREATININE: 1.41 mg/dL — AB (ref 0.61–1.24)
Calcium: 8.4 mg/dL — ABNORMAL LOW (ref 8.9–10.3)
GFR calc Af Amer: 58 mL/min — ABNORMAL LOW (ref >60)
GFR calc non Af Amer: 50 mL/min — ABNORMAL LOW (ref >60)
GLUCOSE: 106 mg/dL — AB (ref 65–99)
Potassium: 3.7 mmol/L (ref 3.5–5.1)
SODIUM: 136 mmol/L (ref 135–145)

## 2017-09-03 MED ORDER — LEVOFLOXACIN 750 MG PO TABS: 750.0000 mg | ORAL_TABLET | Freq: Every day | ORAL | 0 refills | Status: AC

## 2017-09-03 MED FILL — levoFLOXacin 750 MG TABS: 750 | 5 days supply | Qty: 5 | Fill #0

## 2017-09-03 NOTE — Progress Notes (Signed)
Discharge instructions and prescription for po Levaquin given to patient,verbalized understanding. All questions answered appropriately. Stable. Discharged home.

## 2017-09-03 NOTE — Discharge Instructions (Signed)
Community-Acquired Pneumonia, Adult °Pneumonia is an infection of the lungs. There are different types of pneumonia. One type can develop while a person is in a hospital. A different type, called community-acquired pneumonia, develops in people who are not, or have not recently been, in the hospital or other health care facility. °What are the causes? °Pneumonia may be caused by bacteria, viruses, or funguses. Community-acquired pneumonia is often caused by Streptococcus pneumonia bacteria. These bacteria are often passed from one person to another by breathing in droplets from the cough or sneeze of an infected person. °What increases the risk? °The condition is more likely to develop in: °· People who have chronic diseases, such as chronic obstructive pulmonary disease (COPD), asthma, congestive heart failure, cystic fibrosis, diabetes, or kidney disease. °· People who have early-stage or late-stage HIV. °· People who have sickle cell disease. °· People who have had their spleen removed (splenectomy). °· People who have poor dental hygiene. °· People who have medical conditions that increase the risk of breathing in (aspirating) secretions their own mouth and nose. °· People who have a weakened immune system (immunocompromised). °· People who smoke. °· People who travel to areas where pneumonia-causing germs commonly exist. °· People who are around animal habitats or animals that have pneumonia-causing germs, including birds, bats, rabbits, cats, and farm animals. ° °What are the signs or symptoms? °Symptoms of this condition include: °· A dry cough. °· A wet (productive) cough. °· Fever. °· Sweating. °· Chest pain, especially when breathing deeply or coughing. °· Rapid breathing or difficulty breathing. °· Shortness of breath. °· Shaking chills. °· Fatigue. °· Muscle aches. ° °How is this diagnosed? °Your health care provider will take a medical history and perform a physical exam. You may also have other tests,  including: °· Imaging studies of your chest, including X-rays. °· Tests to check your blood oxygen level and other blood gases. °· Other tests on blood, mucus (sputum), fluid around your lungs (pleural fluid), and urine. ° °If your pneumonia is severe, other tests may be done to identify the specific cause of your illness. °How is this treated? °The type of treatment that you receive depends on many factors, such as the cause of your pneumonia, the medicines you take, and other medical conditions that you have. For most adults, treatment and recovery from pneumonia may occur at home. In some cases, treatment must happen in a hospital. Treatment may include: °· Antibiotic medicines, if the pneumonia was caused by bacteria. °· Antiviral medicines, if the pneumonia was caused by a virus. °· Medicines that are given by mouth or through an IV tube. °· Oxygen. °· Respiratory therapy. ° °Although rare, treating severe pneumonia may include: °· Mechanical ventilation. This is done if you are not breathing well on your own and you cannot maintain a safe blood oxygen level. °· Thoracentesis. This procedure removes fluid around one lung or both lungs to help you breathe better. ° °Follow these instructions at home: °· Take over-the-counter and prescription medicines only as told by your health care provider. °? Only take cough medicine if you are losing sleep. Understand that cough medicine can prevent your body’s natural ability to remove mucus from your lungs. °? If you were prescribed an antibiotic medicine, take it as told by your health care provider. Do not stop taking the antibiotic even if you start to feel better. °· Sleep in a semi-upright position at night. Try sleeping in a reclining chair, or place a few pillows under your head. °· Do not use tobacco products, including cigarettes, chewing   tobacco, and e-cigarettes. If you need help quitting, ask your health care provider. °· Drink enough water to keep your urine  clear or pale yellow. This will help to thin out mucus secretions in your lungs. °How is this prevented? °There are ways that you can decrease your risk of developing community-acquired pneumonia. Consider getting a pneumococcal vaccine if: °· You are older than 68 years of age. °· You are older than 68 years of age and are undergoing cancer treatment, have chronic lung disease, or have other medical conditions that affect your immune system. Ask your health care provider if this applies to you. ° °There are different types and schedules of pneumococcal vaccines. Ask your health care provider which vaccination option is best for you. °You may also prevent community-acquired pneumonia if you take these actions: °· Get an influenza vaccine every year. Ask your health care provider which type of influenza vaccine is best for you. °· Go to the dentist on a regular basis. °· Wash your hands often. Use hand sanitizer if soap and water are not available. ° °Contact a health care provider if: °· You have a fever. °· You are losing sleep because you cannot control your cough with cough medicine. °Get help right away if: °· You have worsening shortness of breath. °· You have increased chest pain. °· Your sickness becomes worse, especially if you are an older adult or have a weakened immune system. °· You cough up blood. °This information is not intended to replace advice given to you by your health care provider. Make sure you discuss any questions you have with your health care provider. °Document Released: 12/14/2005 Document Revised: 04/23/2016 Document Reviewed: 04/10/2015 °Elsevier Interactive Patient Education © 2017 [employer] ° °

## 2017-09-03 NOTE — Discharge Summary (Signed)
[facility]  Physician Discharge Summary   Patient ID: Eric Dudley MRN: [id number] DOB/AGE: 06-01-1951 66 y.o.  Admit date: 08/31/2017 Discharge date: 09/03/2017  PCP: [name], MD  DISCHARGE DIAGNOSES:  Principal Problem:   Pneumonia Active Problems:   CKD (chronic kidney disease), stage II   Multiple myeloma in remission ([place])   AKI (acute kidney injury) ([place])   Sepsis ([place])   RECOMMENDATIONS FOR OUTPATIENT FOLLOW UP: 1. 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 On Friday prior to admission, he had gone to [facility] for follow-up and received MMR vaccination. Subsequently, developed fever. He called his outpatient providers and was asked to come into the hospital. Chest x-ray raised concern for pneumonia. Patient was started on broad-spectrum antibiotics.  Consultations:  Oncology   HOSPITAL COURSE:   Sepsis resulting from pneumonia in an immunocompromised patient. Patient did have fever, tachycardia with elevated lactic acid level at admission. Sepsis physiology resolved. Cultures negative. Patient was initially on broad-spectrum antibiotics. Pro-calcitonin level was not noted to be that elevated. As he improved, he was transitioned to oral antibiotics, Levaquin. Will be discharged on the same today.  Acute kidney injury on chronic kidney disease, stage II. Patient did have acute kidney injury. Baseline creatinine around 1.3-1.4. Improved with IV hydration.    Hyponatremia Resolved with IV fluids.   Hypokalemia. This appears to be a chronic issue for him. He is on potassium supplements at home as well. Potassium level is normal today.  History of multiple myeloma. Patient is status post bone marrow transplant at [facility]. He is also followed by Dr. Burr Medico at [facility]. He was on Revlimid for multiple myeloma which is being held due to his infection. This will be addressed by his oncologist at follow-up. He is also on valacyclovir for herpes suppression.   Overall, stable. Feels much better. Ambulating without any difficulties. Okay for discharge home today.   PERTINENT LABS:  The results of significant diagnostics from this hospitalization (including imaging, microbiology, ancillary and laboratory) are listed below for reference.    Microbiology: Recent Results (from the past 240 hour(s))  Culture, blood (Routine x 2)     Status: None (Preliminary result)   Collection Time: 08/31/17  7:40 PM  Result Value Ref Range Status   Specimen Description BLOOD RIGHT HAND  Final   Special Requests IN PEDIATRIC BOTTLE Blood Culture adequate volume  Final   Culture   Final    NO GROWTH 2 DAYS Performed at [facility], [address], [place], [place] [address]    Report Status PENDING  Incomplete  Culture, blood (Routine x 2)     Status: None (Preliminary result)   Collection Time: 08/31/17  7:47 PM  Result Value Ref Range Status   Specimen Description BLOOD LEFT FOREARM  Final   Special Requests   Final    BOTTLES DRAWN AEROBIC AND ANAEROBIC Blood Culture adequate volume   Culture   Final    NO GROWTH 2 DAYS Performed at [facility]  [facility], [place] [address], [place], [place] [address]    Report Status PENDING  Incomplete     Labs: Basic Metabolic Panel:  Recent Labs Lab 08/31/17 2012 09/01/17 0230 09/02/17 0345 09/03/17 0352  NA 131* 136 136 136  K 2.9* 2.9* 2.9* 3.7  CL 100* 110 109 109  CO2  20* 19* 18* 20*  GLUCOSE 97 101* 96 106*  BUN '14 12 10 9  ' CREATININE 1.73* 1.50* 1.39* 1.41*  CALCIUM 8.6* 7.9* 8.1* 8.4*  MG  --  1.7 1.9  --    Liver Function Tests:  Recent Labs Lab 08/31/17 2012  AST 71*  ALT 90*  ALKPHOS 114  BILITOT 0.6  PROT 7.5  ALBUMIN 3.6   CBC:  Recent Labs Lab 08/31/17 2012 09/01/17 0230 09/02/17 0345  WBC 7.1 6.3 5.0  NEUTROABS 4.9 4.3  --   HGB 17.0 15.0 16.0  HCT 47.8 42.2 44.2  MCV 103.2* 102.7* 101.8*  PLT 158 145* 142*     IMAGING STUDIES Dg Chest 2 View  Result Date: 08/31/2017 CLINICAL DATA:  Intermittent fever for 3 days. Active chemotherapy for multiple myeloma. EXAM: CHEST  2 VIEW COMPARISON:  Chest CT 09/30/2015 FINDINGS: Left perihilar patchy opacity corresponds to superior segment of the left lower lobe suspicious for pneumonia. The right lung is clear. Small nodular opacity projecting over the right lung base may be a nipple shadow. Heart is normal in size. Normal mediastinal contours. No pneumothorax or pleural fluid. Chronic compression fractures in the lower thoracic spine are similar to prior chest CT. IMPRESSION: 1. Left perihilar consolidation suspicious for pneumonia in the setting of fever. Followup PA and lateral chest X-ray is recommended in 3-4 weeks following trial of antibiotic therapy to ensure resolution and exclude underlying malignancy. 2. Small well-circumscribed nodular opacity projecting over the right lung base may be a nipple shadow, pulmonary nodule is not entirely excluded. Consider including nipple markers on follow-up exam. Electronically Signed   By: Jeb Levering M.D.   On: 08/31/2017 19:30    DISCHARGE EXAMINATION: Vitals:   09/02/17 0456 09/02/17 1414 09/02/17 2033 09/03/17 0502  BP: (!) 142/58 109/67 (!) 123/59 130/62  Pulse: 61 73 70 63  Resp: '16 16 16 16  ' Temp: 98.2 F (36.8 C) 98.4 F (36.9 C) 98.3 F (36.8 C) 98.3 F (36.8 C)  TempSrc: Oral Oral Oral Oral  SpO2: 99% 100% 100% 99%   Weight:      Height:       General appearance: alert, cooperative, appears stated age and no distress Resp: clear to auscultation bilaterally Cardio: regular rate and rhythm, S1, S2 normal, no murmur, click, rub or gallop GI: soft, non-tender; bowel sounds normal; no masses,  no organomegaly  DISPOSITION: Home  Discharge Instructions    Call MD for:  extreme fatigue    Complete by:  As directed    Call MD for:  persistant dizziness or light-headedness    Complete by:  As directed    Call MD for:  persistant nausea and vomiting    Complete by:  As directed    Call MD for:  severe uncontrolled pain    Complete by:  As directed    Call MD for:  temperature >100.4    Complete by:  As directed    Discharge instructions    Complete by:  As directed    Please take your medications as prescribed. Please be sure to follow-up with your oncologist. Diet as before.  You were cared for by a hospitalist during your hospital stay. If you have any questions about your discharge medications or the care you received while you were in the hospital after you are discharged, you can call the unit and asked to speak with the hospitalist on call if the hospitalist that took care of you is not available. Once you are discharged, your primary care physician will handle any further medical issues. Please note that NO REFILLS for any discharge medications will be authorized once you are discharged, as it is imperative that you return to your primary care physician (or establish a relationship with a primary care physician if you do not have one) for your aftercare needs so that they can reassess your need for medications and monitor your lab values. If you do not have a primary care physician, you can call [phone] for a physician referral.   Increase activity slowly    Complete by:  As directed       ALLERGIES:  Allergies  Allergen Reactions  . Hydrochlorothiazide Rash and Palpitations  . Asa [Aspirin]  Nausea Only  . Calcium Carbonate Nausea And Vomiting  . Oxycodone Other (See Comments)    Patient does not want to take. Severe Hallucinations.  . Amoxicillin Itching and Rash  . Indapamide Palpitations    Lightheadedness, dizziness      Discharge Medication List as of 09/03/2017 10:53 AM    START taking these medications   Details  levofloxacin (LEVAQUIN) 750 MG tablet Take 1 tablet (750 mg total) by mouth daily., Starting Sat 09/04/2017, Until Thu 09/09/2017, Print      CONTINUE these medications which have NOT CHANGED   Details  aspirin 81 MG chewable tablet Chew 1 tablet (81 mg total) by mouth daily., Starting Wed 05/01/2015, No Print    calcium carbonate (OS-CAL) 600 MG TABS tablet Take 1,200 mg by mouth at bedtime. , Historical Med    Cholecalciferol (VITAMIN D3) 1000 units CAPS Take 2,000 mg by mouth daily., Historical Med    Multiple Vitamins-Minerals (CENTRUM SILVER ULTRA MENS PO) Take 1 tablet by mouth at bedtime. , Historical Med    ondansetron (ZOFRAN) 4 MG tablet TAKE 1 TABLET FOUR TIMES A DAY AS NEEDED FOR NAUSEA OR VOMITING, Normal    potassium chloride (MICRO-K) 10 MEQ CR capsule TAKE 3 CAPSULES DAILY, Normal    ranitidine (ZANTAC) 150 MG tablet Take 2 tablets (300 mg total) by mouth at bedtime., Starting Fri 05/24/2015, Normal    simvastatin (ZOCOR) 20 MG tablet Take 20 mg by mouth daily., Historical Med    valACYclovir (VALTREX) 1000 MG tablet Take 1 tablet (1,000 mg total) by mouth daily., Starting Fri 05/14/2017, Normal    Zoledronic Acid (ZOMETA) 4 MG/100ML IVPB Inject 4 mg into the vein. EVERY 60 DAYS, Historical Med    zolpidem (AMBIEN) 5 MG tablet Take 1 tablet (5 mg total) by mouth at bedtime as needed for sleep., Starting Mon 04/26/2017, Normal    nitroGLYCERIN (NITROSTAT) 0.4 MG SL tablet Place 0.4 mg under the tongue every 5 (five) minutes x 3 doses as needed for chest pain. Reported on 05/19/2016, Historical Med      STOP taking these medications      lenalidomide (REVLIMID) 5 MG capsule          Follow-up Information    Truitt Merle, MD Follow up.   Specialties:  Hematology, Oncology Contact information: [place] [address] [phone]  TOTAL DISCHARGE TIME: 35 minutes  [facility] Pager [phone]  09/03/2017, 12:45 PM

## 2017-09-05 LAB — CULTURE, BLOOD (ROUTINE X 2)
Culture: NO GROWTH
Culture: NO GROWTH
SPECIAL REQUESTS: ADEQUATE
Special Requests: ADEQUATE

## 2017-09-07 DIAGNOSIS — I10 Essential (primary) hypertension: Secondary | ICD-10-CM | POA: Diagnosis not present

## 2017-09-07 DIAGNOSIS — Z125 Encounter for screening for malignant neoplasm of prostate: Secondary | ICD-10-CM | POA: Diagnosis not present

## 2017-09-07 DIAGNOSIS — E782 Mixed hyperlipidemia: Secondary | ICD-10-CM | POA: Diagnosis not present

## 2017-09-07 DIAGNOSIS — Z0000 Encounter for general adult medical examination without abnormal findings: Secondary | ICD-10-CM | POA: Diagnosis not present

## 2017-09-07 DIAGNOSIS — N390 Urinary tract infection, site not specified: Secondary | ICD-10-CM | POA: Diagnosis not present

## 2017-09-07 DIAGNOSIS — N183 Chronic kidney disease, stage 3 (moderate): Secondary | ICD-10-CM | POA: Diagnosis not present

## 2017-09-09 ENCOUNTER — Other Ambulatory Visit: Payer: Self-pay | Admitting: *Deleted

## 2017-09-09 DIAGNOSIS — C9001 Multiple myeloma in remission: Secondary | ICD-10-CM

## 2017-09-09 MED ORDER — REVLIMID 5 MG PO CAPS: 5.0000 mg | ORAL_CAPSULE | ORAL | 0 refills | Status: DC

## 2017-09-13 DIAGNOSIS — E782 Mixed hyperlipidemia: Secondary | ICD-10-CM | POA: Diagnosis not present

## 2017-09-13 DIAGNOSIS — N200 Calculus of kidney: Secondary | ICD-10-CM | POA: Diagnosis not present

## 2017-09-13 DIAGNOSIS — K210 Gastro-esophageal reflux disease with esophagitis: Secondary | ICD-10-CM | POA: Diagnosis not present

## 2017-09-13 DIAGNOSIS — E538 Deficiency of other specified B group vitamins: Secondary | ICD-10-CM | POA: Diagnosis not present

## 2017-09-13 DIAGNOSIS — R809 Proteinuria, unspecified: Secondary | ICD-10-CM | POA: Diagnosis not present

## 2017-09-13 DIAGNOSIS — N183 Chronic kidney disease, stage 3 (moderate): Secondary | ICD-10-CM | POA: Diagnosis not present

## 2017-09-13 DIAGNOSIS — I129 Hypertensive chronic kidney disease with stage 1 through stage 4 chronic kidney disease, or unspecified chronic kidney disease: Secondary | ICD-10-CM | POA: Diagnosis not present

## 2017-09-13 NOTE — Progress Notes (Signed)
[facility]  [phone] Fax:(336) [phone]  Clinic Follow Up Note   Patient Care Team: [name], MD as PCP - General (Internal Medicine) Debara Pickett Nadean Corwin, MD as Consulting Physician (Cardiology) Truitt Merle, MD as Consulting Physician (Hematology) 09/17/2017   CHIEF COMPLAINTS: Follow-up multiple myeloma    Multiple myeloma in remission ([place])   01/31/2015 Tumor Marker    SPEP showed M-SPIKE 1.7g/dl, Cr 1.5, no anemia, hypercalcemia      03/19/2015 Imaging    Bone survey showed no discrete lytic lesion, but there is osteopenia and calvarial heterogeneity.      03/27/2015 Initial Diagnosis    Multiple myeloma, stage I      03/27/2015 Bone Marrow Biopsy    Bone Marrow, Aspirate,Biopsy: - HYPERCELLULAR BONE MARROW FOR AGE WITH PLASMA CELL NEOPLASM 47% - SEE COMMENT. PERIPHERAL BLOOD: - NO SIGNIFICANT MORPHOLOGIC ABNORMALITIES. Diagnosis Note The bone marrow shows increased number of       03/27/2015 Miscellaneous    bone marrow Cytogenetics: normal. FISH panel showed the presence of +4 and +11, this is consider standard risk of MM       04/16/2015 - 04/23/2015 Hospital Admission    He was admitted for worsening back pain, was found to have a T12 pathological fracture. He underwent kyphoplasty on 04/19/2015.      04/17/2015 Imaging    MR Thoracic and Lumbar Spine w/o Contrast 04/17/2015 IMPRESSION: MR THORACIC SPINE IMPRESSION: Pathologic compression fracture at T8 in this patient with widespread multiple myeloma. Slight retropulsion of abnormal bone without conus compression. MR LUMBAR SPINE IMPRESSION: Focal myeloma deposits at L3 and T9 without significant compression deformity. Mild stenosis at L4-5 related to ordinary spondylosis.      04/19/2015 - 08/02/2015 Chemotherapy    RVD with weekly Velcade 1.3 mg/m, dexamethasone 40 mg, and Revlimid 10 mg daily on day 1-21, every 28 days, cycle 1 Revlimid was interupted by hospitalization,  cycle 3 Revlimid dose increased to 25 mg daily due to his improved renal function. total 4 cyc      04/26/2015 - 05/01/2015 Hospital Admission    He was admitted to [facility] for syncope episode during his radiation simulation. EKG showed ST elevation, troponin was positive, he underwent cardio catheterization which showed mild stenosis. No intervention was needed. echo showed EF 35-40%      04/29/2015 - 05/10/2015 Radiation Therapy    palliative RT to T12, 20Gy in 10 fractions      08/19/2015 Bone Marrow Transplant    He underwent autologous stem cell transplant at [facility].       09/30/2015 Imaging    CT anginal chest 09/30/2015 IMPRESSION: 1. No pulmonary embolism. 2. No thoracic aortic aneurysm or dissection. 3. Heart size is normal. No pericardial effusion. 4. Mild scarring/atelectasis at each lung base, probably chronic. Lungs otherwise clear. No evidence of pneumonia. No pleural effusion. 5. Evidence of diffuse multiple myeloma involvement throughout the thoracolumbar spine and multiple ribs bilaterally, with areas of present clinical relevance described below. 6. Compression fracture deformity of the T12 vertebral body, known pathologic fracture, status post treatment with vertebroplasty on 04/19/2015. 7. Milder compression deformity of the overlying T11 vertebral body, approximately 40% compressed anteriorly, also presumably pathologic. This vertebral body appeared essentially normal on fluoroscopic images from the earlier aforementioned vertebroplasty of 04/19/2015. 8. Slight irregularity of the right lateral sixth rib, with subtle cortical sclerosis suggesting nondisplaced healing fracture. This may be a relatively acute fracture and is probably pathologic in nature  related to underlying multiple myeloma. 9. Similar irregularity of the left lateral fifth rib, suggesting minimally displaced healing fracture. This may also be a relatively acute fracture and  is likely pathologic in nature related to underlying multiple myeloma. These acute or subacute rib fractures are the most likely source of patient's bilateral chest pain. Additional incidental findings in the upper abdomen: Fatty infiltration of liver, cholelithiasis without evidence of acute cholecystitis, fairly large amount of stool within the nondistended colon of the upper abdomen (constipation?).      11/27/2015 Bone Marrow Biopsy    hypocellular marrow (10%), no increased plasma cells       12/29/2015 -  Chemotherapy    maintenance Revlimid 65m daily, 3 weeks on, one week off   Held for Hospital stay and restarted after recovery      04/22/2017 - 04/26/2017 Hospital Admission    Admit date: 04/22/17-04/26/17 Admission diagnosis: Shingles Herpes Zoster Additional comments: Held Revlimid during hospital stay and healing      08/31/2017 - 09/03/2017 Hospital Admission    Admit date: 08/31/17 Admission diagnosis: Pneumonia  Additional comments: [facility] and was [facility] hospital 08/31/17 -Treated with oral Levaquin after broad-spectrum antibiotics in hospital  -Held Revlimid until next follow up*        HISTORY OF PRESENTING ILLNESS (03/15/2015):  [name]66y.o. 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  He has been having low back pain for 3-4 months. He had food posinin 4 month ago, and had frequent diarrhea. He started noticed low back pain since then. The pain is not radiating to leg, he also has intermittent right rib pain, worse with cough and sneezing. He remains to be physically active, able to do all the activities, such as gardening work housework without much limitation, but he doesn't sings slowly because of back pain. He takes Tylenol as needed, does not like the necrotic pain medication. No  night sweats, no fever or chillss, no weight loss.   He was seen by his primary care physician Dr. RMerrilee Seashore MD and had lab test (see below). He also had bone density scan 2/25 which showed osteoporosis, has not been treated yet.  Current therapy:   1. Maintenance Revlimid 5 mg daily, 3 weeks on, one-week off, started on 12/29/2015 2. Zometa injections every 3 months started 09/2015 Revlimid Held for 4 weeks due to shingles episode, restarted week of 05/22/17. Held since 08/31/17 while hospitalized for Pneumonia and Restarted 09/17/17.  INTERIM HISTORY:  Mr. TKhimreturns for follow-up. He presents to the clinic today post Pneumonia. He was in Hospital for 3 nights and 4 days. His symptoms resolved quickly. He went back to being after 3 days later. He had held revlimid since hospitalization.  [facility] He is fine to take Flu shot here today.  He was active in the yard and got bruises around his arm often. Overall he feels much better now.      MEDICAL HISTORY:  Past Medical History:  Diagnosis Date  . Arthritis    neck and back pain  . Cholelithiases   . Chronic kidney disease    MILD,CHRONIC  . Hyperlipidemia   . Hypertension   . [place]   . 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 APPENDECTOMY  1962  . CARDIAC CATHETERIZATION N/A 04/30/2015   Procedure: Left Heart Cath and Coronary Angiography;  Surgeon: Peter M Martinique, MD;  Location: [facility] INVASIVE CV LAB CUPID;  Service: Cardiovascular;  Laterality: N/A;  . CARDIOLITE MYOCARDIAL PERFUSION STUDY  04/16/03   NEGITIVE BRUCE PROTOCAL EXERCISE STRESS TEST. EF 70%. NO ISCHEMIA.  [name] KNEE SURGERY  2012   right  . SHOULDER SURGERY  1999   left    SOCIAL HISTORY: History   Social History  . Marital Status: Married    Spouse Name: N/A  . Number of Children: 1   . Years of Education: N/A   Occupational History  . A  retired Therapist, occupational    Social History Main Topics  . Smoking status: Former Smoker    Types: Cigars    Quit date: 12/29/2003  . Smokeless tobacco: Former Systems developer    Types: Chew    Quit date: 12/28/1994  . Alcohol Use: Yes     Comment: occasional beer  . Drug Use: No  . Sexual Activity: Not on file   Other Topics Concern  . Not on file   Social History Narrative  . No narrative on file    FAMILY HISTORY: Family History  Problem Relation Age of Onset  . Cancer Mother 38       cervical cancer   . Cancer Father        unknown cancer   . Hypertension Sister   . Cancer Sister        melanoma     ALLERGIES:  is allergic to hydrochlorothiazide; asa [aspirin]; calcium carbonate; oxycodone; amoxicillin; and indapamide.  MEDICATIONS:  Current Outpatient Prescriptions  Medication Sig Dispense Refill  . aspirin 81 MG chewable tablet Chew 1 tablet (81 mg total) by mouth daily. (Patient taking differently: Chew 81 mg by mouth at bedtime. )    . calcium carbonate (OS-CAL) 600 MG TABS tablet Take 1,200 mg by mouth at bedtime.     . Cholecalciferol (VITAMIN D3) 1000 units CAPS Take 2,000 mg by mouth daily.    . Multiple Vitamins-Minerals (CENTRUM SILVER ULTRA MENS PO) Take 1 tablet by mouth at bedtime.     . nitroGLYCERIN (NITROSTAT) 0.4 MG SL tablet Place 0.4 mg under the tongue every 5 (five) minutes x 3 doses as needed for chest pain. Reported on 05/19/2016    . ondansetron (ZOFRAN) 4 MG tablet TAKE 1 TABLET FOUR TIMES A DAY AS NEEDED FOR NAUSEA OR VOMITING 30 tablet 2  . potassium chloride (MICRO-K) 10 MEQ CR capsule TAKE 3 CAPSULES DAILY 90 capsule 2  . ranitidine (ZANTAC) 150 MG tablet Take 2 tablets (300 mg total) by mouth at bedtime. 60 tablet 1  . REVLIMID 5 MG capsule Take 1 capsule (5 mg total) by mouth as directed. Take 5 mg daily for 21 days, rest 7 days. 21 capsule 0  . simvastatin (ZOCOR) 20 MG tablet Take 20 mg by mouth daily.    . valACYclovir (VALTREX) 1000 MG tablet  Take 1 tablet (1,000 mg total) by mouth daily. 60 tablet 2  . Zoledronic Acid (ZOMETA) 4 MG/100ML IVPB Inject 4 mg into the vein. EVERY 60 DAYS    . zolpidem (AMBIEN) 5 MG tablet Take 1 tablet (5 mg total) by mouth at bedtime as needed for sleep. 30 tablet 0   No current facility-administered medications for this visit.     REVIEW OF SYSTEMS:  Constitutional: Denies fevers, chills or abnormal night sweats Eyes: Denies blurriness of vision, double vision or watery eyes Ears, nose, mouth, throat, and face: Denies mucositis or sore throat (+) unexpected occasional epistaxis, controlled Respiratory: Denies cough, dyspnea or wheezes Cardiovascular: Denies palpitation, chest discomfort or lower extremity swelling Gastrointestinal:  Denies nausea, heartburn or change in bowel habits Skin: Negative Lymphatics: Denies new lymphadenopathy or easy bruising Neurological:Denies numbness, tingling or new weaknesses Behavioral/Psych: Mood is stable, no new changes  Musculoskeletal: Positive for back pain and intermittent rib pain All other systems were reviewed with the patient and are negative.  PHYSICAL EXAMINATION:  ECOG PERFORMANCE STATUS: 1 BP (!) 118/56 (BP Location: Left Arm, Patient Position: Sitting) Comment: nurse aware  Pulse (!) 57   Temp 97.9 F (36.6 C) (Oral)   Resp 17   Ht '5\' 9"'  (1.753 m)   Wt 157 lb 8 oz (71.4 kg)   SpO2 100%   BMI 23.26 kg/m    GENERAL: well-appearing, no distress SKIN: skin color, texture, turgor are normal, no rashes or significant lesions except to some healing rash on his left upper face, his abdomen due to shingles EYES: normal, conjunctiva are pink and non-injected, sclera clear OROPHARYNX:no exudate, no erythema and lips, buccal mucosa, and tongue normal  NECK: supple, thyroid normal size, non-tender, without nodularity LYMPH:  no palpable lymphadenopathy in the cervical, axillary or inguinal LUNGS: clear to auscultation and percussion with normal  breathing effort HEART: regular rate & rhythm and no murmurs and no lower extremity edema ABDOMEN:abdomen soft, non-tender and normal bowel sounds Musculoskeletal:no cyanosis of digits and no clubbing, mild tenderness on bilateral lateral chest wall.  PSYCH: alert & oriented x 3 with fluent speech NEURO: no focal motor/sensory deficits CHEST WALL: slightly enlarged left breast, mild tenderness, no palpable mass. He states his left breast is slightly bigger than right for all his adult life.  LABORATORY DATA:   CBC Latest Ref Rng & Units 09/17/2017 09/02/2017 09/01/2017  WBC 4.0 - 10.3 10e3/uL 3.6(L) 5.0 6.3  Hemoglobin 13.0 - 17.1 g/dL 16.0 16.0 15.0  Hematocrit 38.4 - 49.9 % 47.4 44.2 42.2  Platelets 140 - 400 10e3/uL 198 142(L) 145(L)   CMP Latest Ref Rng & Units 09/17/2017 09/03/2017 09/02/2017  Glucose 70 - 140 mg/dl 101 106(H) 96  BUN 7.0 - 26.0 mg/dL 9.'3 9 10  ' Creatinine 0.7 - 1.3 mg/dL 1.3 1.41(H) 1.39(H)  Sodium 136 - 145 mEq/L 138 136 136  Potassium 3.5 - 5.1 mEq/L 3.4(L) 3.7 2.9(L)  Chloride 101 - 111 mmol/L - 109 109  CO2 22 - 29 mEq/L 22 20(L) 18(L)  Calcium 8.4 - 10.4 mg/dL 9.6 8.4(L) 8.1(L)  Total Protein 6.4 - 8.3 g/dL 6.8 - -  Total Bilirubin 0.20 - 1.20 mg/dL 0.52 - -  Alkaline Phos 40 - 150 U/L 105 - -  AST 5 - 34 U/L 28 - -  ALT 0 - 55 U/L 28 - -    MM lab: SPEP M-protein (g/dl) 03/15/2015: 1.6 05/24/2015: 0.5 06/21/2015: 0.4 07/18/2015 (at [facility]): 0.33 11/27/2015: not det 01/21/2016: not det  03/24/2016: not det 05/19/2016: not det  08/07/2016: not det  10/30/2017: not det 03/18/2017: Not det 06/25/17: not det.  09/17/17: PENDING  Serum IgG (mg/dl) 03/15/2015: 2000 05/24/2015: 579 06/21/2015: 673 07/18/2015 ([facility]): 509 11/27/2015: 396 01/21/2016: 487 03/24/2016: 563 05/19/2016: 600 08/07/2016: 530 10/30/2016: 589 03/18/2017: 584 06/25/17: 645 09/17/17: PENDING  Serum kappa/lamda/ratio (mg/dl)  03/15/2015: 690, 0.13, 5308 05/24/2015: 146, 1.27, 115.0 06/21/2015:  86,  0.74, 116.2 01/21/2016: 1.09, 1.21, 0.9 03/24/2016: 1.72, 1.49, 1.16 05/19/2016: 1.91, 1.50, 1.27 08/07/2016: 1.36, 1.41, 0.96  10/30/2016: 1.6, 1.32, 1.21  03/18/2017: 1.67, 1.55, 1.08 06/25/17: 1.51, 1.46, 1.03 09/17/17: PENDING  24 h urine kappa light chain (mg) 03/18/2015: 2434 05/28/2015: 582 06/24/2015: 322 04/27/2016: 499    PATHOLOGY REPORT Bone Marrow, Aspirate,Biopsy, and Clot, right iliac 03/27/2015 - HYPERCELLULAR BONE MARROW FOR AGE WITH PLASMA CELL NEOPLASM. - SEE COMMENT. PERIPHERAL BLOOD: - NO SIGNIFICANT MORPHOLOGIC ABNORMALITIES. Diagnosis Note The bone marrow shows increased number of atypical plasma cells representing 47% of all cells in the aspirate associated with prominent interstitial infiltrates and variably sized aggregates in the clot and biopsy sections. Immunohistochemical stains show that the plasma cells are kappa light chain restricted consistent with plasma cell neoplasm. The background shows trilineage Cytogenetics: normal FISH panel: +4 and +11  Bone Marrow, Aspirate,Biopsy, and Clot, 11/27/2015 at [facility]: [id number] RECEIVED: 11/27/2015 ORDERING PHYSICIAN: CESAR Melburn Hake , MD PATIENT NAME: Eric Dudley BONE MARROW REPORT Bone Marrow (BM) and Peripheral Blood (PB) FINAL PATHOLOGIC DIAGNOSIS BONE MARROW: Hypocellular marrow (10%) with no increase in plasma cells. See comment. PERIPHERAL BLOOD: Absolute lymphopenia. See CBC data. COMMENT: The touch prep is cellular and adequate for evaluation. Rare plasma cells are present (<1%). Blasts are not increased, Myeloid and erythroid cells are present in normal proportions with intact maturation and no overt dysplastic changes. Megakaryocytes are absent. The aspirate smears are aspicular and hemodilute. Bone marrow and clot sections are variably hypocellular (0-20% range, 10% overall). Scattered interstitial plasma cells are present and comprise <5% of marrow cellularity  by CD138 immunohistochemical analysis. Blasts are not increased. Myeloid and erythroid cells are present in normal proportions and have no overt morphologic abnormalities. Megakaryocytes are morphologically within normal limits. Examination of the peripheral blood reveals absolute lymphopenia. There are no circulating plasma cells or evidence of rouleaux formation.   RADIOGRAPHIC STUDIES: No new scans   ASSESSMENT & PLAN:  66 y.o. Caucasian male with past medical history of hypertension, tonsil cancer status post surgery and radiation 10 years ago, now presented with 3-4 months low back pain and intermittent rib pain. His blood test showed M protein 1.7g/dl.  1. IgG multiple myeloma, stage I, standard risk by cytogenetics (+4 and +11), in remission  -I previously discussed his initial bone marrow biopsy results with him. He has significant amount of plasma cells in the bone marrow 47%.  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 Based on the new notable myeloma diagnostic criteria, he meets the criteria of multiple myeloma. He has normal albumin and miildly elevated beta 2 microglobulin, this is stage I. -His cytogenetics and Fish panel revealed standard risk. -His thoracic and lumbar spine MRI showed pathologic compression fracture at T12, focal myeloma deposits at L3 and T9, diffuse marrow signal abnormality consistent with myeloma -I previously recommended induction chemotherapy with VRD regimen, weekly Velcade and dexamethasone, first 2 cycles Revlimid dose reduced to 10 mg daily 3 week on, one-week off, based on his renal function, and it which was increased to full dose from cycle 3. He received a total of 4 cycles of treatment, achieved a  very good partial response. Repeat bone marrow on 07/18/2015 showed 5% plasma cells, fish and cytogenetics was  normal. -His post transplant evaluation previously showed complete remission (<1% plasma cell in marrow, undetectable M protein), and he has recovered very well. -He previously started maintenance Revlimid 5 mg daily, 3 weeks on, 1 week off, tolerating well, we'll continue. - previously Discussed possibility of Revlimid may iIncreasing risk of secondary cancer occurring. -Colonoscopy in 2020, he does PSA screening at [facility]  -continue Zometa every 3 months for a total of 2 years  -continue acyclovir for herpes suppression  -labs reviewed his Cr. Is slightly elevated at 1.4 (08/06/17). I recommend him to not take Advil or ibuprofen and to drink more water. His potassium is 3.2, I suggest he takes 3 for the next 5 days then return back to 2 a day. Blood counts are normal.  -Based on his 06/25/17 MM panel he is in remission. -Held Revlimid due to hospitalization 08/31/2017 after immunization shots while at [facility].  -I will do a Xray in 2 weeks to make sure his Pneumonia is resolved.  -Labs reviewed with patient and are adequate to restart revlimid. He will test his 24 hour urine before next visit.  -Will continue with one more infusion of Zometa and will be done  -Pt opted for Flu shot today in clinic.  -I recommend him be careful when working in the yard. As it gets cold he should use long sleeves to protect himself from bruising on arms.  -F/u in 4 weeks then every 8 weeks after that.    2. Disseminated Shingles in 03/2017 -He was admitted to the hospital for Shingles on 4/26-4/30 -This flared due to compromised immune system while on Revlimid -We will restart acyclovir 1049m daily for prophylaxis -Will follow up with PCP to review when to stop treatment  3. T12 pathologic compression fracture, and diffuse myeloma involvement in bones -Status post kyphoplasty and palliative radiation  -He  previously received palliative Radiation, pain much improved overall. He does complain more mid back and right-sided chest pain daily, likely related to his physical activities. -continue zometa infusion every 3 months, he has been seeing his dentist regularly.    4. nonobstructive CAD, STEMI, Non ischemic CM, Takatsubo, with EF of 35-40% -Patient was initially found to have troponin elevation and anterior apical ST elevation MI and resultant cardiomyopathy on 04/26/15 -He previously underwent a cardiac catheterization on 5/3 which showed mild diffuse disease, no stents were placed. He is on aspirin -follow up with Dr. HDebara Pickett -He has recovered well. Repeat echo before transplant showed EF 55-60% in 11/2015   5. Peripheral neuropathy, grade 1 -Likely secondary to his bone marrow transplant chemotherapy -Mild and Stable overall, we'll continue observation  6. Advanced directives -his advanced directives is in EPIC -he is full code.  7. Pneumonia 08/31/2017 -[facility] and [facility] 08/31/17 -Treated with oral Levaquin after broad-spectrum antibiotics in hospital  -Held Revlimid until next follow up -I will do Chest X-ray in 2 weeks to make sure his PNA is completely resolved. -Good to restart Revlimid today    Plan -Restart Revlimid maintenance, 5 mg daily for 3 weeks, then one week off,  tomorrow  -Flu shot today in infusion room -Zometa infusion today (last one) -Lab and f/u in 5 weeks -Chest X-ray in 2 weeks   All questions were answered. The patient knows to call the clinic with any problems, questions or concerns.  I spent 20 minutes counseling the patient face to face. The total time spent  in the appointment was 30 minutes and more than 50% was on counseling.     Truitt Merle, MD 09/17/2017     This document serves as a record of services personally performed by Truitt Merle, MD. It was created on her behalf by Joslyn Devon, a trained medical scribe. The creation of this record is based on the scribe's personal observations and the provider's statements to them. This document has been checked and approved by the attending provider.

## 2017-09-17 ENCOUNTER — Other Ambulatory Visit (HOSPITAL_BASED_OUTPATIENT_CLINIC_OR_DEPARTMENT_OTHER): Payer: Medicare Other

## 2017-09-17 ENCOUNTER — Telehealth: Payer: Self-pay | Admitting: Hematology

## 2017-09-17 ENCOUNTER — Ambulatory Visit (HOSPITAL_BASED_OUTPATIENT_CLINIC_OR_DEPARTMENT_OTHER): Payer: Medicare Other | Admitting: Hematology

## 2017-09-17 ENCOUNTER — Ambulatory Visit (HOSPITAL_BASED_OUTPATIENT_CLINIC_OR_DEPARTMENT_OTHER): Payer: Medicare Other

## 2017-09-17 ENCOUNTER — Encounter: Payer: Self-pay | Admitting: Hematology

## 2017-09-17 VITALS — BP 118/56 | HR 57 | Temp 97.9°F | Resp 17 | Ht 69.0 in | Wt 157.5 lb

## 2017-09-17 DIAGNOSIS — IMO0001 Reserved for inherently not codable concepts without codable children: Secondary | ICD-10-CM

## 2017-09-17 DIAGNOSIS — C9001 Multiple myeloma in remission: Secondary | ICD-10-CM | POA: Diagnosis not present

## 2017-09-17 DIAGNOSIS — Z23 Encounter for immunization: Secondary | ICD-10-CM | POA: Diagnosis not present

## 2017-09-17 DIAGNOSIS — J189 Pneumonia, unspecified organism: Secondary | ICD-10-CM

## 2017-09-17 DIAGNOSIS — G620 Drug-induced polyneuropathy: Secondary | ICD-10-CM

## 2017-09-17 DIAGNOSIS — I428 Other cardiomyopathies: Secondary | ICD-10-CM

## 2017-09-17 DIAGNOSIS — N182 Chronic kidney disease, stage 2 (mild): Secondary | ICD-10-CM

## 2017-09-17 DIAGNOSIS — I10 Essential (primary) hypertension: Secondary | ICD-10-CM

## 2017-09-17 DIAGNOSIS — M4850XD Collapsed vertebra, not elsewhere classified, site unspecified, subsequent encounter for fracture with routine healing: Secondary | ICD-10-CM

## 2017-09-17 LAB — CBC WITH DIFFERENTIAL/PLATELET
BASO%: 0.4 % (ref 0.0–2.0)
BASOS ABS: 0 10*3/uL (ref 0.0–0.1)
EOS%: 0.9 % (ref 0.0–7.0)
Eosinophils Absolute: 0 10*3/uL (ref 0.0–0.5)
HEMATOCRIT: 47.4 % (ref 38.4–49.9)
HGB: 16 g/dL (ref 13.0–17.1)
LYMPH%: 18 % (ref 14.0–49.0)
MCH: 36.7 pg — AB (ref 27.2–33.4)
MCHC: 33.8 g/dL (ref 32.0–36.0)
MCV: 108.5 fL — ABNORMAL HIGH (ref 79.3–98.0)
MONO#: 0.4 10*3/uL (ref 0.1–0.9)
MONO%: 11.3 % (ref 0.0–14.0)
NEUT#: 2.5 10*3/uL (ref 1.5–6.5)
NEUT%: 69.4 % (ref 39.0–75.0)
Platelets: 198 10*3/uL (ref 140–400)
RBC: 4.37 10*6/uL (ref 4.20–5.82)
RDW: 15.8 % — AB (ref 11.0–14.6)
WBC: 3.6 10*3/uL — AB (ref 4.0–10.3)
lymph#: 0.7 10*3/uL — ABNORMAL LOW (ref 0.9–3.3)

## 2017-09-17 LAB — COMPREHENSIVE METABOLIC PANEL
ALT: 28 U/L (ref 0–55)
AST: 28 U/L (ref 5–34)
Albumin: 3.5 g/dL (ref 3.5–5.0)
Alkaline Phosphatase: 105 U/L (ref 40–150)
Anion Gap: 9 mEq/L (ref 3–11)
BUN: 9.3 mg/dL (ref 7.0–26.0)
CALCIUM: 9.6 mg/dL (ref 8.4–10.4)
CHLORIDE: 107 meq/L (ref 98–109)
CO2: 22 meq/L (ref 22–29)
CREATININE: 1.3 mg/dL (ref 0.7–1.3)
EGFR: 55 mL/min/{1.73_m2} — ABNORMAL LOW (ref >90)
Glucose: 101 mg/dl (ref 70–140)
POTASSIUM: 3.4 meq/L — AB (ref 3.5–5.1)
Sodium: 138 mEq/L (ref 136–145)
Total Bilirubin: 0.52 mg/dL (ref 0.20–1.20)
Total Protein: 6.8 g/dL (ref 6.4–8.3)

## 2017-09-17 MED ORDER — SODIUM CHLORIDE 0.9 % IV SOLN
INTRAVENOUS | Status: DC
  Administered 2017-09-17: 11:00:00 via INTRAVENOUS

## 2017-09-17 MED ORDER — INFLUENZA VAC SPLIT QUAD 0.5 ML IM SUSY
0.5000 mL | PREFILLED_SYRINGE | Freq: Once | INTRAMUSCULAR | Status: AC
  Administered 2017-09-17: 0.5 mL via INTRAMUSCULAR
  Filled 2017-09-17: qty 0.5

## 2017-09-17 MED ORDER — ZOLEDRONIC ACID 4 MG/100ML IV SOLN
4.0000 mg | Freq: Once | INTRAVENOUS | Status: AC
  Administered 2017-09-17: 4 mg via INTRAVENOUS
  Filled 2017-09-17: qty 100

## 2017-09-17 NOTE — Telephone Encounter (Signed)
Gave avs and calendar for October  °

## 2017-09-17 NOTE — Patient Instructions (Addendum)
Zoledronic Acid injection (Hypercalcemia, Oncology) What is this medicine? ZOLEDRONIC ACID (ZOE le dron ik AS id) lowers the amount of calcium loss from bone. It is used to treat too much calcium in your blood from cancer. It is also used to prevent complications of cancer that has spread to the bone. This medicine may be used for other purposes; ask your health care provider or pharmacist if you have questions. COMMON BRAND NAME(S): Zometa What should I tell my health care provider before I take this medicine? They need to know if you have any of these conditions: -aspirin-sensitive asthma -cancer, especially if you are receiving medicines used to treat cancer -dental disease or wear dentures -infection -kidney disease -receiving corticosteroids like dexamethasone or prednisone -an unusual or allergic reaction to zoledronic acid, other medicines, foods, dyes, or preservatives -pregnant or trying to get pregnant -breast-feeding How should I use this medicine? This medicine is for infusion into a vein. It is given by a health care professional in a hospital or clinic setting. Talk to your pediatrician regarding the use of this medicine in children. Special care may be needed. Overdosage: If you think you have taken too much of this medicine contact a poison control center or emergency room at once. NOTE: This medicine is only for you. Do not share this medicine with others. What if I miss a dose? It is important not to miss your dose. Call your doctor or health care professional if you are unable to keep an appointment. What may interact with this medicine? -certain antibiotics given by injection -NSAIDs, medicines for pain and inflammation, like ibuprofen or naproxen -some diuretics like bumetanide, furosemide -teriparatide -thalidomide This list may not describe all possible interactions. Give your health care provider a list of all the medicines, herbs, non-prescription drugs, or  dietary supplements you use. Also tell them if you smoke, drink alcohol, or use illegal drugs. Some items may interact with your medicine. What should I watch for while using this medicine? Visit your doctor or health care professional for regular checkups. It may be some time before you see the benefit from this medicine. Do not stop taking your medicine unless your doctor tells you to. Your doctor may order blood tests or other tests to see how you are doing. Women should inform their doctor if they wish to become pregnant or think they might be pregnant. There is a potential for serious side effects to an unborn child. Talk to your health care professional or pharmacist for more information. You should make sure that you get enough calcium and vitamin D while you are taking this medicine. Discuss the foods you eat and the vitamins you take with your health care professional. Some people who take this medicine have severe bone, joint, and/or muscle pain. This medicine may also increase your risk for jaw problems or a broken thigh bone. Tell your doctor right away if you have severe pain in your jaw, bones, joints, or muscles. Tell your doctor if you have any pain that does not go away or that gets worse. Tell your dentist and dental surgeon that you are taking this medicine. You should not have major dental surgery while on this medicine. See your dentist to have a dental exam and fix any dental problems before starting this medicine. Take good care of your teeth while on this medicine. Make sure you see your dentist for regular follow-up appointments. What side effects may I notice from receiving this medicine? Side effects that   you should report to your doctor or health care professional as soon as possible: -allergic reactions like skin rash, itching or hives, swelling of the face, lips, or tongue -anxiety, confusion, or depression -breathing problems -changes in vision -eye pain -feeling faint or  lightheaded, falls -jaw pain, especially after dental work -mouth sores -muscle cramps, stiffness, or weakness -redness, blistering, peeling or loosening of the skin, including inside the mouth -trouble passing urine or change in the amount of urine Side effects that usually do not require medical attention (report to your doctor or health care professional if they continue or are bothersome): -bone, joint, or muscle pain -constipation -diarrhea -fever -hair loss -irritation at site where injected -loss of appetite -nausea, vomiting -stomach upset -trouble sleeping -trouble swallowing -weak or tired This list may not describe all possible side effects. Call your doctor for medical advice about side effects. You may report side effects to FDA at 1-800-FDA-1088. Where should I keep my medicine? This drug is given in a hospital or clinic and will not be stored at home. NOTE: This sheet is a summary. It may not cover all possible information. If you have questions about this medicine, talk to your doctor, pharmacist, or health care provider.  2018 [employer] (2014-05-12 14:19:39)  Influenza Virus Vaccine (Flucelvax) What is this medicine? INFLUENZA VIRUS VACCINE (in floo EN zuh VAHY ruhs vak SEEN) helps to reduce the risk of getting influenza also known as the flu. The vaccine only helps protect you against some strains of the flu. This medicine may be used for other purposes; ask your health care provider or pharmacist if you have questions. COMMON BRAND NAME(S): FLUCELVAX What should I tell my health care provider before I take this medicine? They need to know if you have any of these conditions: -bleeding disorder like hemophilia -fever or infection -Guillain-Barre syndrome or other neurological problems -immune system problems -infection with the human immunodeficiency virus (HIV) or AIDS -low blood platelet counts -multiple sclerosis -an unusual or allergic  reaction to influenza virus vaccine, other medicines, foods, dyes or preservatives -pregnant or trying to get pregnant -breast-feeding How should I use this medicine? This vaccine is for injection into a muscle. It is given by a health care professional. A copy of Vaccine Information Statements will be given before each vaccination. Read this sheet carefully each time. The sheet may change frequently. Talk to your pediatrician regarding the use of this medicine in children. Special care may be needed. Overdosage: If you think you've taken too much of this medicine contact a poison control center or emergency room at once. Overdosage: If you think you have taken too much of this medicine contact a poison control center or emergency room at once. NOTE: This medicine is only for you. Do not share this medicine with others. What if I miss a dose? This does not apply. What may interact with this medicine? -chemotherapy or radiation therapy -medicines that lower your immune system like etanercept, anakinra, infliximab, and adalimumab -medicines that treat or prevent blood clots like warfarin -phenytoin -steroid medicines like prednisone or cortisone -theophylline -vaccines This list may not describe all possible interactions. Give your health care provider a list of all the medicines, herbs, non-prescription drugs, or dietary supplements you use. Also tell them if you smoke, drink alcohol, or use illegal drugs. Some items may interact with your medicine. What should I watch for while using this medicine? Report any side effects that do not go away within 3 days to  your doctor or health care professional. Call your health care provider if any unusual symptoms occur within 6 weeks of receiving this vaccine. You may still catch the flu, but the illness is not usually as bad. You cannot get the flu from the vaccine. The vaccine will not protect against colds or other illnesses that may cause fever. The  vaccine is needed every year. What side effects may I notice from receiving this medicine? Side effects that you should report to your doctor or health care professional as soon as possible: -allergic reactions like skin rash, itching or hives, swelling of the face, lips, or tongue Side effects that usually do not require medical attention (Report these to your doctor or health care professional if they continue or are bothersome.): -fever -headache -muscle aches and pains -pain, tenderness, redness, or swelling at the injection site -tiredness This list may not describe all possible side effects. Call your doctor for medical advice about side effects. You may report side effects to FDA at 1-800-FDA-1088. Where should I keep my medicine? The vaccine will be given by a health care professional in a clinic, pharmacy, doctor's office, or other health care setting. You will not be given vaccine doses to store at home. NOTE: This sheet is a summary. It may not cover all possible information. If you have questions about this medicine, talk to your doctor, pharmacist, or health care provider.  2018 [employer] Standard (2011-11-25 14:06:47)

## 2017-09-20 LAB — KAPPA/LAMBDA LIGHT CHAINS
IG KAPPA FREE LIGHT CHAIN: 11.4 mg/L (ref 3.3–19.4)
Ig Lambda Free Light Chain: 11.7 mg/L (ref 5.7–26.3)
Kappa/Lambda FluidC Ratio: 0.97 (ref 0.26–1.65)

## 2017-09-21 ENCOUNTER — Other Ambulatory Visit: Payer: Self-pay | Admitting: Hematology

## 2017-09-21 LAB — MULTIPLE MYELOMA PANEL, SERUM
ALBUMIN/GLOB SERPL: 1.2 (ref 0.7–1.7)
Albumin SerPl Elph-Mcnc: 3.2 g/dL (ref 2.9–4.4)
Alpha 1: 0.2 g/dL (ref 0.0–0.4)
Alpha2 Glob SerPl Elph-Mcnc: 1.1 g/dL — ABNORMAL HIGH (ref 0.4–1.0)
B-Globulin SerPl Elph-Mcnc: 1 g/dL (ref 0.7–1.3)
Gamma Glob SerPl Elph-Mcnc: 0.6 g/dL (ref 0.4–1.8)
Globulin, Total: 2.9 g/dL (ref 2.2–3.9)
IGA/IMMUNOGLOBULIN A, SERUM: 7 mg/dL — AB (ref 61–437)
IGM (IMMUNOGLOBIN M), SRM: 13 mg/dL — AB (ref 20–172)
IgG, Qn, Serum: 542 mg/dL — ABNORMAL LOW (ref 700–1600)
Total Protein: 6.1 g/dL (ref 6.0–8.5)

## 2017-09-24 ENCOUNTER — Other Ambulatory Visit: Payer: Self-pay | Admitting: *Deleted

## 2017-09-24 DIAGNOSIS — C9001 Multiple myeloma in remission: Secondary | ICD-10-CM

## 2017-09-24 MED ORDER — VALACYCLOVIR HCL 1 G PO TABS: 1000.0000 mg | ORAL_TABLET | Freq: Every day | ORAL | 2 refills | Status: DC

## 2017-09-27 DIAGNOSIS — D480 Neoplasm of uncertain behavior of bone and articular cartilage: Secondary | ICD-10-CM | POA: Diagnosis not present

## 2017-10-04 ENCOUNTER — Other Ambulatory Visit: Payer: Self-pay | Admitting: *Deleted

## 2017-10-04 DIAGNOSIS — C9001 Multiple myeloma in remission: Secondary | ICD-10-CM

## 2017-10-04 MED ORDER — REVLIMID 5 MG PO CAPS: 5.0000 mg | ORAL_CAPSULE | ORAL | 0 refills | Status: DC

## 2017-10-08 DIAGNOSIS — H5203 Hypermetropia, bilateral: Secondary | ICD-10-CM | POA: Diagnosis not present

## 2017-10-08 DIAGNOSIS — H2513 Age-related nuclear cataract, bilateral: Secondary | ICD-10-CM | POA: Diagnosis not present

## 2017-10-12 ENCOUNTER — Other Ambulatory Visit: Payer: Self-pay | Admitting: Hematology

## 2017-10-21 NOTE — Progress Notes (Signed)
[facility]  [phone] [phone]  Clinic Follow Up Note   Patient Care Team: Eric Seashore, MD as PCP - General (Internal Medicine) Eric Pickett Nadean Corwin, MD as Consulting Physician (Cardiology) Eric Merle, MD as Consulting Physician (Hematology) 10/22/2017   CHIEF COMPLAINTS: Follow-up multiple myeloma    Multiple myeloma in remission ([facility])   01/31/2015 Tumor Marker    SPEP showed M-SPIKE 1.7g/dl, Cr 1.5, no anemia, hypercalcemia      03/19/2015 Imaging    Bone survey showed no discrete lytic lesion, but there is osteopenia and calvarial heterogeneity.      03/27/2015 Initial Diagnosis    Multiple myeloma, stage I      03/27/2015 Bone Marrow Biopsy    Bone Marrow, Aspirate,Biopsy: - HYPERCELLULAR BONE MARROW FOR AGE WITH PLASMA CELL NEOPLASM 47% - SEE COMMENT. PERIPHERAL BLOOD: - NO SIGNIFICANT MORPHOLOGIC ABNORMALITIES. Diagnosis Note The bone marrow shows increased number of       03/27/2015 Miscellaneous    bone marrow Cytogenetics: normal. FISH panel showed the presence of +4 and +11, this is consider standard risk of MM       04/16/2015 - 04/23/2015 [facility] Admission    He was admitted for worsening back pain, was found to have a T12 pathological fracture. He underwent kyphoplasty on 04/19/2015.      04/17/2015 Imaging    MR Thoracic and Lumbar Spine w/o Contrast 04/17/2015 IMPRESSION: MR THORACIC SPINE IMPRESSION: Pathologic compression fracture at T8 in this patient with widespread multiple myeloma. Slight retropulsion of abnormal bone without conus compression. MR LUMBAR SPINE IMPRESSION: Focal myeloma deposits at L3 and T9 without significant compression deformity. Mild stenosis at L4-5 related to ordinary spondylosis.      04/19/2015 - 08/02/2015 Chemotherapy    RVD with weekly Velcade 1.3 mg/m, dexamethasone 40 mg, and Revlimid 10 mg daily on day 1-21, every 28 days, cycle 1 Revlimid was interupted by hospitalization,  cycle 3 Revlimid dose increased to 25 mg daily due to his improved renal function. total 4 cyc      04/26/2015 - 05/01/2015 [facility] Admission    He was admitted to [facility] for syncope episode during his radiation simulation. EKG showed ST elevation, troponin was positive, he underwent cardio catheterization which showed mild stenosis. No intervention was needed. echo showed EF 35-40%      04/29/2015 - 05/10/2015 Radiation Therapy    palliative RT to T12, 20Gy in 10 fractions      08/19/2015 Bone Marrow Transplant    He underwent autologous stem cell transplant at [facility].       09/30/2015 Imaging    CT anginal chest 09/30/2015 IMPRESSION: 1. No pulmonary embolism. 2. No thoracic aortic aneurysm or dissection. 3. Heart size is normal. No pericardial effusion. 4. Mild scarring/atelectasis at each lung base, probably chronic. Lungs otherwise clear. No evidence of pneumonia. No pleural effusion. 5. Evidence of diffuse multiple myeloma involvement throughout the thoracolumbar spine and multiple ribs bilaterally, with areas of present clinical relevance described below. 6. Compression fracture deformity of the T12 vertebral body, known pathologic fracture, status post treatment with vertebroplasty on 04/19/2015. 7. Milder compression deformity of the overlying T11 vertebral body, approximately 40% compressed anteriorly, also presumably pathologic. This vertebral body appeared essentially normal on fluoroscopic images from the earlier aforementioned vertebroplasty of 04/19/2015. 8. Slight irregularity of the right lateral sixth rib, with subtle cortical sclerosis suggesting nondisplaced healing fracture. This may be a relatively acute fracture and is probably pathologic in nature  related to underlying multiple myeloma. 9. Similar irregularity of the left lateral fifth rib, suggesting minimally displaced healing fracture. This may also be a relatively acute fracture and  is likely pathologic in nature related to underlying multiple myeloma. These acute or subacute rib fractures are the most likely source of patient's bilateral chest pain. Additional incidental findings in the upper abdomen: Fatty infiltration of liver, cholelithiasis without evidence of acute cholecystitis, fairly large amount of stool within the nondistended colon of the upper abdomen (constipation?).      11/27/2015 Bone Marrow Biopsy    hypocellular marrow (10%), no increased plasma cells       12/29/2015 -  Chemotherapy    maintenance Revlimid 19m daily, 3 weeks on, one week off   Held for [facility] stay and restarted after recovery      04/22/2017 - 04/26/2017 [facility] Admission    Admit date: 04/22/17-04/26/17 Admission diagnosis: Shingles Herpes Zoster Additional comments: Held Revlimid during [facility] stay and healing      08/31/2017 - 09/03/2017 [facility] Admission    Admit date: 08/31/17 Admission diagnosis: Pneumonia  Additional comments: [facility] and was [facility] to [facility] 08[facility]31/17 -Treated with oral Levaquin after broad-spectrum antibiotics in [facility]  -Held Revlimid until next follow up*        HISTORY OF PRESENTING ILLNESS (03/15/2015):  [facility]89y.o. 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  He has been having low back pain for 3-4 months. He had food posinin 4 month ago, and had frequent diarrhea. He started noticed low back pain since then. The pain is not radiating to leg, he also has intermittent right rib pain, worse with cough and sneezing. He remains to be physically active, able to do all the activities, such as gardening work housework without much limitation, but he doesn't sings slowly because of back pain. He takes Tylenol as needed, does not like the necrotic pain medication. No  night sweats, no fever or chillss, no weight loss.   He was seen by his primary care physician Dr. RMerrilee Seashore MD and had lab test (see below). He also had bone density scan 2/25 which showed osteoporosis, has not been treated yet.  Current therapy:   Maintenance Revlimid 5 mg daily, 3 weeks on, one-week off, started on 12/29/2015. Revlimid Held for 4 weeks due to shingles episode, restarted week of 05/22/17. Held since 08/31/17 while hospitalized for Pneumonia and Restarted 09/17/17.    INTERIM HISTORY:  Mr. THeinzreturns for follow-up. He presents to the clinic today noting he is well. He starts next cycle on 11/05/17. He restarted Revlimid and he is tolerating it well. He will see Dr. RNorma Fredricksonagain 07/2018. He has left over pills due to his frequent illness. He does not need a refill today.  He went to see an oral surgeon because his bone is trying to come through his gumline. It is not significant or bothering him.  He notes having a "catch" in his right lateral neck and tingling down his right arm. It hurts when he sleeps on his left side. He is not sure when this started but it has increased lately. 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 His numbness and tingling in his hands and toes, worse in feet, is much improved.  He was not contacted for  Chest Xray but can get one today. He requested a letter for dismissal of [employer].     MEDICAL HISTORY:  Past Medical History:  Diagnosis Date  . Arthritis    neck and back pain  . Cholelithiases   . Chronic kidney disease    MILD,CHRONIC  . Hyperlipidemia   . Hypertension   . Multiple myeloma ([place])   . 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 APPENDECTOMY  1962  . CARDIAC CATHETERIZATION N/A 04/30/2015   Procedure: Left Heart Cath and Coronary Angiography;  Surgeon: Peter M Martinique, MD;  Location: [facility] INVASIVE CV LAB CUPID;  Service: Cardiovascular;   Laterality: N/A;  . CARDIOLITE MYOCARDIAL PERFUSION STUDY  04/16/03   NEGITIVE BRUCE PROTOCAL EXERCISE STRESS TEST. EF 70%. NO ISCHEMIA.  [name] KNEE SURGERY  2012   right  . SHOULDER SURGERY  1999   left    SOCIAL HISTORY: History   Social History  . Marital Status: Married    Spouse Name: N/A  . Number of Children: 1   . Years of Education: N/A   Occupational History  . A retired Therapist, occupational    Social History Main Topics  . Smoking status: Former Smoker    Types: Cigars    Quit date: 12/29/2003  . Smokeless tobacco: Former Systems developer    Types: Chew    Quit date: 12/28/1994  . Alcohol Use: Yes     Comment: occasional beer  . Drug Use: No  . Sexual Activity: Not on file   Other Topics Concern  . Not on file   Social History Narrative  . No narrative on file    FAMILY HISTORY: Family History  Problem Relation Age of Onset  . Cancer Mother 87       cervical cancer   . Cancer Father        unknown cancer   . Hypertension Sister   . Cancer Sister        melanoma     ALLERGIES:  is allergic to hydrochlorothiazide; asa [aspirin]; calcium carbonate; oxycodone; amoxicillin; and indapamide.  MEDICATIONS:  Current Outpatient Prescriptions  Medication Sig Dispense Refill  . aspirin 81 MG chewable tablet Chew 1 tablet (81 mg total) by mouth daily. (Patient taking differently: Chew 81 mg by mouth at bedtime. )    . calcium carbonate (OS-CAL) 600 MG TABS tablet Take 1,200 mg by mouth at bedtime.     . Cholecalciferol (VITAMIN D3) 1000 units CAPS Take 2,000 mg by mouth daily.    . Multiple Vitamins-Minerals (CENTRUM SILVER ULTRA MENS PO) Take 1 tablet by mouth at bedtime.     . nitroGLYCERIN (NITROSTAT) 0.4 MG SL tablet Place 0.4 mg under the tongue every 5 (five) minutes x 3 doses as needed for chest pain. Reported on 05/19/2016    . ondansetron (ZOFRAN) 4 MG tablet TAKE 1 TABLET FOUR TIMES A DAY AS NEEDED FOR NAUSEA OR VOMITING 30 tablet 2  . potassium chloride (MICRO-K) 10  MEQ CR capsule TAKE 3 CAPSULES DAILY 90 capsule 2  . ranitidine (ZANTAC) 150 MG tablet Take 2 tablets (300 mg total) by mouth at bedtime. 60 tablet 1  . REVLIMID 5 MG capsule Take 1 capsule (5 mg total) by mouth as directed. Take 5 mg daily for 21 days, rest 7 days. 21 capsule 0  . simvastatin (ZOCOR) 20 MG tablet Take 20 mg by mouth daily.    . valACYclovir (  VALTREX) 1000 MG tablet Take 1 tablet (1,000 mg total) by mouth daily. 60 tablet 2  . Zoledronic Acid (ZOMETA) 4 MG/100ML IVPB Inject 4 mg into the vein. EVERY 60 DAYS    . zolpidem (AMBIEN) 5 MG tablet Take 1 tablet (5 mg total) by mouth at bedtime as needed for sleep. 30 tablet 0   No current facility-administered medications for this visit.     REVIEW OF SYSTEMS:   Constitutional: Denies fevers, chills or abnormal night sweats Eyes: Denies blurriness of vision, double vision or watery eyes Ears, nose, mouth, throat, and face: Denies mucositis or sore throat (+) unexpected occasional epistaxis, controlled Respiratory: Denies cough, dyspnea or wheezes Cardiovascular: Denies palpitation, chest discomfort or lower extremity swelling Gastrointestinal:  Denies nausea, heartburn or change in bowel habits Skin: Negative Lymphatics: Denies new lymphadenopathy or easy bruising Neurological: (+) occasional tingling from right neck down right arm. (+) Neuropathy in hands and toes, much improved.  Behavioral/Psych: Mood is stable, no new changes  Musculoskeletal: negative All other systems were reviewed with the patient and are negative.  PHYSICAL EXAMINATION:  ECOG PERFORMANCE STATUS: 1 BP (!) 142/68 (BP Location: Left Arm, Patient Position: Sitting)   Pulse (!) 57   Temp 97.6 F (36.4 C) (Oral)   Resp 20   Ht '5\' 9"'  (1.753 m)   Wt 161 lb 6.4 oz (73.2 kg)   SpO2 100%   BMI 23.83 kg/m   GENERAL: well-appearing, no distress SKIN: skin color, texture, turgor are normal, no rashes or significant lesions except to some healing rash on his  left upper face, his abdomen due to shingles EYES: normal, conjunctiva are pink and non-injected, sclera clear OROPHARYNX:no exudate, no erythema and lips, buccal mucosa, and tongue normal  NECK: supple, thyroid normal size, non-tender, without nodularity LYMPH:  no palpable lymphadenopathy in the cervical, axillary or inguinal LUNGS: clear to auscultation and percussion with normal breathing effort HEART: regular rate & rhythm and no murmurs and no lower extremity edema ABDOMEN:abdomen soft, non-tender and normal bowel sounds Musculoskeletal:no cyanosis of digits and no clubbing, mild tenderness on bilateral lateral chest wall.  PSYCH: alert & oriented x 3 with fluent speech NEURO: no focal motor/sensory deficits CHEST WALL: slightly enlarged left breast, mild tenderness, no palpable mass. He states his left breast is slightly bigger than right for all his adult life.  LABORATORY DATA:   CBC Latest Ref Rng & Units 10/22/2017 09/17/2017 09/02/2017  WBC 4.0 - 10.3 10e3/uL 4.1 3.6(L) 5.0  Hemoglobin 13.0 - 17.1 g/dL 16.0 16.0 16.0  Hematocrit 38.4 - 49.9 % 47.7 47.4 44.2  Platelets 140 - 400 10e3/uL 211 198 142(L)   CMP Latest Ref Rng & Units 09/17/2017 09/17/2017 09/03/2017  Glucose 70 - 140 mg/dl 101 - 106(H)  BUN 7.0 - 26.0 mg/dL 9.3 - 9  Creatinine 0.7 - 1.3 mg/dL 1.3 - 1.41(H)  Sodium 136 - 145 mEq/L 138 - 136  Potassium 3.5 - 5.1 mEq/L 3.4(L) - 3.7  Chloride 101 - 111 mmol/L - - 109  CO2 22 - 29 mEq/L 22 - 20(L)  Calcium 8.4 - 10.4 mg/dL 9.6 - 8.4(L)  Total Protein 6.4 - 8.3 g/dL 6.8 6.1 -  Total Bilirubin 0.20 - 1.20 mg/dL 0.52 - -  Alkaline Phos 40 - 150 U/L 105 - -  AST 5 - 34 U/L 28 - -  ALT 0 - 55 U/L 28 - -    MM lab: SPEP M-protein (g/dl) 03/15/2015: 1.6 05/24/2015: 0.5 06/21/2015: 0.4 07/18/2015 (  at [facility]): 0.33 11/27/2015: not det 01/21/2016: not det  03/24/2016: not det 05/19/2016: not det  08/07/2016: not det  10/30/2017: not det 03/18/2017: Not det 06/25/17: not det.   09/17/17: not det.    Serum IgG (mg/dl) 03/15/2015: 2000 05/24/2015: 579 06/21/2015: 673 07/18/2015 ([facility]): 509 11/27/2015: 396 01/21/2016: 487 03/24/2016: 563 05/19/2016: 600 08/07/2016: 530 10/30/2016: 589 03/18/2017: 584 06/25/17: 645 09/17/17: 542  Serum kappa/lamda/ratio (mg/dl)  03/15/2015: 690, 0.13, 5308 05/24/2015: 146, 1.27, 115.0 06/21/2015: 86, 0.74, 116.2 01/21/2016: 1.09, 1.21, 0.9 03/24/2016: 1.72, 1.49, 1.16 05/19/2016: 1.91, 1.50, 1.27 08/07/2016: 1.36, 1.41, 0.96  10/30/2016: 1.6, 1.32, 1.21  03/18/2017: 1.67, 1.55, 1.08 06/25/17: 1.51, 1.46, 1.03 09/17/17: 1.14, 1.17, 0.97  24 h urine kappa light chain (mg) 03/18/2015: 2434 05/28/2015: 582 06/24/2015: 322 04/27/2016: 499  10/22/17: PENDING    PATHOLOGY REPORT Bone Marrow, Aspirate,Biopsy, and Clot, right iliac 03/27/2015 - HYPERCELLULAR BONE MARROW FOR AGE WITH PLASMA CELL NEOPLASM. - SEE COMMENT. PERIPHERAL BLOOD: - NO SIGNIFICANT MORPHOLOGIC ABNORMALITIES. Diagnosis Note The bone marrow shows increased number of atypical plasma cells representing 47% of all cells in the aspirate associated with prominent interstitial infiltrates and variably sized aggregates in the clot and biopsy sections. Immunohistochemical stains show that the plasma cells are kappa light chain restricted consistent with plasma cell neoplasm. The background shows trilineage Cytogenetics: normal FISH panel: +4 and +11  Bone Marrow, Aspirate,Biopsy, and Clot, 11/27/2015 at [facility]: [id number] RECEIVED: 11/27/2015 ORDERING PHYSICIAN: CESAR Melburn Hake , MD PATIENT NAME: [facility] BONE MARROW REPORT Bone Marrow (BM) and Peripheral Blood (PB) FINAL PATHOLOGIC DIAGNOSIS BONE MARROW: Hypocellular marrow (10%) with no increase in plasma cells. See comment. PERIPHERAL BLOOD: Absolute lymphopenia. See CBC data. COMMENT: The touch prep is cellular and adequate for evaluation. Rare plasma cells are present (<1%).  Blasts are not increased, Myeloid and erythroid cells are present in normal proportions with intact maturation and no overt dysplastic changes. Megakaryocytes are absent. The aspirate smears are aspicular and hemodilute. Bone marrow and clot sections are variably hypocellular (0-20% range, 10% overall). Scattered interstitial plasma cells are present and comprise <5% of marrow cellularity by CD138 immunohistochemical analysis. Blasts are not increased. Myeloid and erythroid cells are present in normal proportions and have no overt morphologic abnormalities. Megakaryocytes are morphologically within normal limits. Examination of the peripheral blood reveals absolute lymphopenia. There are no circulating plasma cells or evidence of rouleaux formation.   RADIOGRAPHIC STUDIES:  Chest Xray 08/31/17 IMPRESSION: 1. Left perihilar consolidation suspicious for pneumonia in the setting of fever. Followup PA and lateral chest X-ray is recommended in 3-4 weeks following trial of antibiotic therapy to ensure resolution and exclude underlying malignancy. 2. Small well-circumscribed nodular opacity projecting over the right lung base may be a nipple shadow, pulmonary nodule is not entirely excluded. Consider including nipple markers on follow-up exam.  ASSESSMENT & PLAN:  66 y.o. Caucasian male with past medical history of hypertension, tonsil cancer status post surgery and radiation 10 years ago, now presented with 3-4 months low back pain and intermittent rib pain. His blood test showed M protein 1.7g/dl.  1. IgG multiple myeloma, stage I, standard risk by cytogenetics (+4 and +11), in remission  -I previously discussed his initial bone marrow biopsy results with him. He has significant amount of plasma cells in the bone marrow 47%.  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 Based on the new notable myeloma diagnostic criteria, he meets the criteria of multiple myeloma. He has normal albumin and miildly elevated beta 2 microglobulin, this is stage I. -His cytogenetics and Fish panel revealed standard risk. -His thoracic and lumbar spine MRI showed pathologic compression fracture at T12, focal myeloma deposits at L3 and T9, diffuse marrow signal abnormality consistent with myeloma -I previously recommended induction chemotherapy with VRD regimen, weekly Velcade and dexamethasone, first 2 cycles Revlimid dose reduced to 10 mg daily 3 week on, one-week off, based on his renal function, and it which was increased to full dose from cycle 3. He received a total of 4 cycles of treatment, achieved a very good partial response. Repeat bone marrow on 07/18/2015 showed 5% plasma cells, fish and cytogenetics was normal. -His post transplant evaluation previously showed complete remission (<1% plasma cell in marrow, undetectable M protein), and he has recovered very well. -He previously started maintenance Revlimid 5 mg daily, 3 weeks on, 1 week off, tolerating well, we'll continue. - previously Discussed possibility of Revlimid may iIncreasing risk of secondary cancer occurring.  -Colonoscopy in 2020, he does PSA screening at [facility] [place]  -he has completed Zometa every 3 months for a total of 2 years  -continue acyclovir for herpes suppression  -His lab has indicating he remains to be in remission  -Held Revlimid due to hospitalization 08/31/2017 after immunization shots while at [facility].  -Restarted maintenance Revlimid on 09/18/17, tolerating well -His disease is standard risk and he has been on maintenance Revlimid for almost 2 years now. I will discuss stopping maintenance with Dr. Norma Fredrickson as his M-Protein remains undetected and he is still in remission.  -I  will do a Xray today to make sure his Pneumonia is resolved.  -Labs reviewed with patient and are adequate to conitnue revlimid.  -F/u in 2 months    2. Disseminated Shingles in 03/2017 -He was admitted to the [facility] for Shingles on 4/26-4/30 -This flared due to compromised immune system while on Revlimid -We will restart acyclovir 1030m daily for prophylaxis -Will follow up with PCP to review when to stop treatment  3. T12 pathologic compression fracture, and diffuse myeloma involvement in bones -Status post kyphoplasty and palliative radiation  -He previously received palliative Radiation, pain much improved overall. He does complain more mid back and right-sided chest pain daily, likely related to his physical activities. -continue zometa infusion every 3 months, he has been seeing his dentist regularly. -He completed Zometa 09/17/17   4. nonobstructive CAD, STEMI, Non ischemic CM, Takatsubo, with EF of 35-40% -Patient was initially found to have troponin elevation and anterior apical ST elevation MI and resultant cardiomyopathy on 04/26/15 -He previously underwent a cardiac catheterization on 5/3 which showed mild diffuse disease, no stents were placed. He is on aspirin -follow up with Dr. HDebara Pickett -He has recovered well. Repeat echo before transplant showed EF 55-60% in 11/2015   5. Peripheral neuropathy, grade 1 -Likely secondary to his bone marrow transplant chemotherapy -Mild and Stable overall, we'll continue observation  6. Advanced directives -his advanced directives is in EPIC -he is full code.  7. Pneumonia 08/31/2017 -[facility] and was [facility] to [facility]/31/17 -Treated with oral Levaquin after broad-spectrum antibiotics in [facility]  -Held Revlimid and restarted 09/17/17 -I will do Chest X-ray today to make sure his PNA  is completely resolved.    Plan -Refill Revlimid in 10/2017 -Chest Xray today  -Continue  maintenance Revlimid, starts next cycle 11/05/17 -Lab and f/u in 2 months   All questions were answered. The patient knows to call the clinic with any problems, questions or concerns.  I spent 20 minutes counseling the patient face to face. The total time spent in the appointment was 30 minutes and more than 50% was on counseling.     [facility], MD 10/22/2017     This document serves as a record of services personally performed by Eric Merle, MD. It was created on her behalf by Joslyn Devon, a trained medical scribe. The creation of this record is based on the scribe's personal observations and the provider's statements to them. This document has been checked and approved by the attending provider.

## 2017-10-22 ENCOUNTER — Ambulatory Visit (HOSPITAL_COMMUNITY)
Admission: RE | Admit: 2017-10-22 | Discharge: 2017-10-22 | Disposition: A | Discharge status: Home / Self Care | Payer: Medicare Other | Source: Ambulatory Visit | Attending: Hematology | Admitting: Hematology

## 2017-10-22 ENCOUNTER — Encounter: Payer: Self-pay | Admitting: Hematology

## 2017-10-22 ENCOUNTER — Telehealth: Payer: Self-pay

## 2017-10-22 ENCOUNTER — Other Ambulatory Visit (HOSPITAL_BASED_OUTPATIENT_CLINIC_OR_DEPARTMENT_OTHER): Payer: Medicare Other

## 2017-10-22 ENCOUNTER — Ambulatory Visit (HOSPITAL_BASED_OUTPATIENT_CLINIC_OR_DEPARTMENT_OTHER): Payer: Medicare Other | Admitting: Hematology

## 2017-10-22 VITALS — BP 142/68 | HR 57 | Temp 97.6°F | Resp 20 | Ht 69.0 in | Wt 161.4 lb

## 2017-10-22 DIAGNOSIS — M545 Low back pain: Secondary | ICD-10-CM | POA: Diagnosis not present

## 2017-10-22 DIAGNOSIS — I428 Other cardiomyopathies: Secondary | ICD-10-CM

## 2017-10-22 DIAGNOSIS — Z8589 Personal history of malignant neoplasm of other organs and systems: Secondary | ICD-10-CM | POA: Diagnosis not present

## 2017-10-22 DIAGNOSIS — N182 Chronic kidney disease, stage 2 (mild): Secondary | ICD-10-CM

## 2017-10-22 DIAGNOSIS — I10 Essential (primary) hypertension: Secondary | ICD-10-CM

## 2017-10-22 DIAGNOSIS — J189 Pneumonia, unspecified organism: Secondary | ICD-10-CM | POA: Diagnosis not present

## 2017-10-22 DIAGNOSIS — M4850XA Collapsed vertebra, not elsewhere classified, site unspecified, initial encounter for fracture: Secondary | ICD-10-CM

## 2017-10-22 DIAGNOSIS — Z923 Personal history of irradiation: Secondary | ICD-10-CM | POA: Diagnosis not present

## 2017-10-22 DIAGNOSIS — G620 Drug-induced polyneuropathy: Secondary | ICD-10-CM | POA: Diagnosis not present

## 2017-10-22 DIAGNOSIS — C9001 Multiple myeloma in remission: Secondary | ICD-10-CM | POA: Diagnosis not present

## 2017-10-22 LAB — CBC WITH DIFFERENTIAL/PLATELET
BASO%: 0.7 % (ref 0.0–2.0)
Basophils Absolute: 0 10*3/uL (ref 0.0–0.1)
EOS%: 4.2 % (ref 0.0–7.0)
Eosinophils Absolute: 0.2 10*3/uL (ref 0.0–0.5)
HEMATOCRIT: 47.7 % (ref 38.4–49.9)
HGB: 16 g/dL (ref 13.0–17.1)
LYMPH#: 0.8 10*3/uL — AB (ref 0.9–3.3)
LYMPH%: 20.1 % (ref 14.0–49.0)
MCH: 36.6 pg — ABNORMAL HIGH (ref 27.2–33.4)
MCHC: 33.5 g/dL (ref 32.0–36.0)
MCV: 109.2 fL — ABNORMAL HIGH (ref 79.3–98.0)
MONO#: 0.7 10*3/uL (ref 0.1–0.9)
MONO%: 15.9 % — ABNORMAL HIGH (ref 0.0–14.0)
NEUT%: 59.1 % (ref 39.0–75.0)
NEUTROS ABS: 2.4 10*3/uL (ref 1.5–6.5)
PLATELETS: 211 10*3/uL (ref 140–400)
RBC: 4.37 10*6/uL (ref 4.20–5.82)
RDW: 15.7 % — ABNORMAL HIGH (ref 11.0–14.6)
WBC: 4.1 10*3/uL (ref 4.0–10.3)

## 2017-10-22 LAB — COMPREHENSIVE METABOLIC PANEL
ALBUMIN: 4 g/dL (ref 3.5–5.0)
ALK PHOS: 106 U/L (ref 40–150)
ALT: 48 U/L (ref 0–55)
AST: 34 U/L (ref 5–34)
Anion Gap: 10 mEq/L (ref 3–11)
BUN: 10.1 mg/dL (ref 7.0–26.0)
CO2: 24 mEq/L (ref 22–29)
CREATININE: 1.3 mg/dL (ref 0.7–1.3)
Calcium: 9.5 mg/dL (ref 8.4–10.4)
Chloride: 106 mEq/L (ref 98–109)
EGFR: 55 mL/min/{1.73_m2} — ABNORMAL LOW (ref >60)
GLUCOSE: 103 mg/dL (ref 70–140)
POTASSIUM: 3.8 meq/L (ref 3.5–5.1)
SODIUM: 140 meq/L (ref 136–145)
TOTAL PROTEIN: 7 g/dL (ref 6.4–8.3)
Total Bilirubin: 0.87 mg/dL (ref 0.20–1.20)

## 2017-10-22 NOTE — Telephone Encounter (Signed)
Printed avs and calender for December upcoming appointment. Per 10/26 los

## 2017-10-24 ENCOUNTER — Encounter: Payer: Self-pay | Admitting: Hematology

## 2017-10-25 ENCOUNTER — Telehealth: Payer: Self-pay | Admitting: *Deleted

## 2017-10-25 LAB — UPEP/UIFE/LIGHT CHAINS/TP, 24-HR UR
% BETA, URINE: 20.7 %
ALBUMIN, U: 36.5 %
ALPHA 1 URINE: 5.4 %
ALPHA-2-GLOBULIN, U: 24.5 %
FREE KAPPA LT CHAINS, UR: 232 mg/L — AB (ref 1.35–24.19)
Free Lambda Lt Chains,Ur: 19.6 mg/L — ABNORMAL HIGH (ref 0.24–6.66)
GAMMA GLOBULIN URINE: 12.8 %
Kappa/Lambda Ratio,U: 11.84 — ABNORMAL HIGH (ref 2.04–10.37)
PROTEIN,TOTAL,URINE: 37.6 mg/dL
Prot,24hr calculated: 959 mg/24 hr — ABNORMAL HIGH (ref 30–150)

## 2017-10-25 NOTE — Telephone Encounter (Signed)
-----   Message from Truitt Merle, MD sent at 10/25/2017  8:09 AM EDT ----- Please let pt know the CXR results, thanks  Truitt Merle  10/25/2017

## 2017-10-25 NOTE — Telephone Encounter (Signed)
TCT patient with results of recent CXR. Pt not home therefore spoke with wife.  Advised her that [facility] cxr is improving.  Wife states he is feeling well and is currently out fishing.  She is aware to call with any questions or concerns.

## 2017-10-29 ENCOUNTER — Other Ambulatory Visit: Payer: Self-pay | Admitting: *Deleted

## 2017-10-29 DIAGNOSIS — C9001 Multiple myeloma in remission: Secondary | ICD-10-CM

## 2017-10-29 MED ORDER — REVLIMID 5 MG PO CAPS: 5.0000 mg | ORAL_CAPSULE | ORAL | 0 refills | Status: DC

## 2017-12-03 ENCOUNTER — Other Ambulatory Visit: Payer: Self-pay | Admitting: *Deleted

## 2017-12-03 DIAGNOSIS — C9001 Multiple myeloma in remission: Secondary | ICD-10-CM

## 2017-12-03 MED ORDER — REVLIMID 5 MG PO CAPS: 5.0000 mg | ORAL_CAPSULE | ORAL | 0 refills | Status: DC

## 2017-12-08 DIAGNOSIS — J069 Acute upper respiratory infection, unspecified: Secondary | ICD-10-CM | POA: Diagnosis not present

## 2017-12-13 ENCOUNTER — Ambulatory Visit (HOSPITAL_COMMUNITY): Payer: Medicare Other | Attending: Cardiovascular Disease

## 2017-12-13 ENCOUNTER — Other Ambulatory Visit: Payer: Self-pay

## 2017-12-13 DIAGNOSIS — I428 Other cardiomyopathies: Secondary | ICD-10-CM | POA: Diagnosis not present

## 2017-12-13 DIAGNOSIS — I051 Rheumatic mitral insufficiency: Secondary | ICD-10-CM | POA: Insufficient documentation

## 2017-12-13 DIAGNOSIS — I5030 Unspecified diastolic (congestive) heart failure: Secondary | ICD-10-CM | POA: Diagnosis not present

## 2017-12-26 NOTE — Progress Notes (Signed)
[place]  [phone] [phone]  Clinic Follow Up Note   Patient Care Team: Eric Seashore, MD as PCP - General (Internal Medicine) Eric Pickett Nadean Corwin, MD as PCP - Cardiology (Cardiology) Eric Pickett Nadean Corwin, MD as Consulting Physician (Cardiology) Eric Merle, MD as Consulting Physician (Hematology) 12/27/2017   CHIEF COMPLAINTS: Follow-up multiple myeloma    Multiple myeloma in remission ([place])   01/31/2015 Tumor Marker    SPEP showed M-SPIKE 1.7g/dl, Cr 1.5, no anemia, hypercalcemia      03/19/2015 Imaging    Bone survey showed no discrete lytic lesion, but there is osteopenia and calvarial heterogeneity.      03/27/2015 Initial Diagnosis    Multiple myeloma, stage I      03/27/2015 Bone Marrow Biopsy    Bone Marrow, Aspirate,Biopsy: - HYPERCELLULAR BONE MARROW FOR AGE WITH PLASMA CELL NEOPLASM 47% - SEE COMMENT. PERIPHERAL BLOOD: - NO SIGNIFICANT MORPHOLOGIC ABNORMALITIES. Diagnosis Note The bone marrow shows increased number of       03/27/2015 Miscellaneous    bone marrow Cytogenetics: normal. FISH panel showed the presence of +4 and +11, this is consider standard risk of MM       04/16/2015 - 04/23/2015 Hospital Admission    He was admitted for worsening back pain, was found to have a T12 pathological fracture. He underwent kyphoplasty on 04/19/2015.      04/17/2015 Imaging    MR Thoracic and Lumbar Spine w/o Contrast 04/17/2015 IMPRESSION: MR THORACIC SPINE IMPRESSION: Pathologic compression fracture at T8 in this patient with widespread multiple myeloma. Slight retropulsion of abnormal bone without conus compression. MR LUMBAR SPINE IMPRESSION: Focal myeloma deposits at L3 and T9 without significant compression deformity. Mild stenosis at L4-5 related to ordinary spondylosis.      04/19/2015 - 08/02/2015 Chemotherapy    RVD with weekly Velcade 1.3 mg/m, dexamethasone 40 mg, and Revlimid 10 mg daily on day 1-21, every 28 days,  cycle 1 Revlimid was interupted by hospitalization, cycle 3 Revlimid dose increased to 25 mg daily due to his improved renal function. total 4 cyc      04/26/2015 - 05/01/2015 Hospital Admission    He was admitted to [facility] for syncope episode during his radiation simulation. EKG showed ST elevation, troponin was positive, he underwent cardio catheterization which showed mild stenosis. No intervention was needed. echo showed EF 35-40%      04/29/2015 - 05/10/2015 Radiation Therapy    palliative RT to T12, 20Gy in 10 fractions      08/19/2015 Bone Marrow Transplant    He underwent autologous stem cell transplant at [facility].       09/30/2015 Imaging    CT anginal chest 09/30/2015 IMPRESSION: 1. No pulmonary embolism. 2. No thoracic aortic aneurysm or dissection. 3. Heart size is normal. No pericardial effusion. 4. Mild scarring/atelectasis at each lung base, probably chronic. Lungs otherwise clear. No evidence of pneumonia. No pleural effusion. 5. Evidence of diffuse multiple myeloma involvement throughout the thoracolumbar spine and multiple ribs bilaterally, with areas of present clinical relevance described below. 6. Compression fracture deformity of the T12 vertebral body, known pathologic fracture, status post treatment with vertebroplasty on 04/19/2015. 7. Milder compression deformity of the overlying T11 vertebral body, approximately 40% compressed anteriorly, also presumably pathologic. This vertebral body appeared essentially normal on fluoroscopic images from the earlier aforementioned vertebroplasty of 04/19/2015. 8. Slight irregularity of the right lateral sixth rib, with subtle cortical sclerosis suggesting nondisplaced healing fracture. This may be a  relatively acute fracture and is probably pathologic in nature related to underlying multiple myeloma. 9. Similar irregularity of the left lateral fifth rib, suggesting minimally displaced healing fracture.  This may also be a relatively acute fracture and is likely pathologic in nature related to underlying multiple myeloma. These acute or subacute rib fractures are the most likely source of patient'Dudley bilateral chest pain. Additional incidental findings in the upper abdomen: Fatty infiltration of liver, cholelithiasis without evidence of acute cholecystitis, fairly large amount of stool within the nondistended colon of the upper abdomen (constipation?).      11/27/2015 Bone Marrow Biopsy    hypocellular marrow (10%), no increased plasma cells       12/29/2015 -  Chemotherapy    maintenance Revlimid 45m daily, 3 weeks on, one week off   Held for Hospital stay and restarted after recovery      04/22/2017 - 04/26/2017 Hospital Admission    Admit date: 04/22/17-04/26/17 Admission diagnosis: Shingles Herpes Zoster Additional comments: Held Revlimid during hospital stay and healing      08/31/2017 - 09/03/2017 Hospital Admission    Admit date: 08/31/17 Admission diagnosis: Pneumonia  Additional comments: [facility] and was admitted to hospital 08/31/17 -Treated with oral Levaquin after broad-spectrum antibiotics in hospital  -Held Revlimid until next follow up*        HISTORY OF PRESENTING ILLNESS (03/15/2015):  [name]66y.o. 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  He has been having low back pain for 3-4 months. He had food posinin 4 month ago, and had frequent diarrhea. He started noticed low back pain since then. The pain is not radiating to leg, he also has intermittent right rib pain, worse with cough and sneezing. He remains to be physically active, able to do all the activities, such as gardening work housework without much limitation, but he doesn't sings slowly because of back pain. He takes Tylenol as needed,  does not like the necrotic pain medication. No night sweats, no fever or chillss, no weight loss.   He was seen by his primary care physician Dr. RMerrilee Seashore MD and had lab test (see below). He also had bone density scan 2/25 which showed osteoporosis, has not been treated yet.  Current therapy:   Maintenance Revlimid 5 mg daily, 3 weeks on, one-week off, started on 12/29/2015. Revlimid Held for 4 weeks due to shingles episode, restarted week of 05/22/17. Held since 08/31/17 while hospitalized for Pneumonia and Restarted 09/17/17.   INTERIM HISTORY:  Mr. TLamarreturns for follow-up. He presents to the clinic today noting he is well. He notes that [place]. He reports that he was not advised by Dr. RNorma Fredricksonon if he should discontinue Revlimid. He has a follow up appointment with Dr. RNorma Fredricksonin August 2019. He reports that he is out of his Revlimid and Acyclovir and will need refills.    On review of systems, pt reports right sided neck pain and right sided throbbing jaw pain x 2 months. His right sided jaw pain is alleviated by applying pressure to the area. He reports that his had his right sided jaw evaluated by a dentist and was informed that he was due to Zometa. He notes that the pain is  not intolerable. He had mild numbness to his bilateral fingers and toes. He denies any other symptoms.       MEDICAL HISTORY:  Past Medical History:  Diagnosis Date  . Arthritis    neck and back pain  . Cholelithiases   . Chronic kidney disease    MILD,CHRONIC  . Hyperlipidemia   . Hypertension   . Multiple myeloma ([place])   . 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 APPENDECTOMY  1962  . CARDIAC CATHETERIZATION N/A 04/30/2015   Procedure: Left Heart Cath and Coronary Angiography;  Surgeon: Peter M Martinique, MD;  Location: [facility] INVASIVE CV LAB CUPID;   Service: Cardiovascular;  Laterality: N/A;  . CARDIOLITE MYOCARDIAL PERFUSION STUDY  04/16/03   NEGITIVE BRUCE PROTOCAL EXERCISE STRESS TEST. EF 70%. NO ISCHEMIA.  [name] KNEE SURGERY  2012   right  . SHOULDER SURGERY  1999   left    SOCIAL HISTORY: History   Social History  . Marital Status: Married    Spouse Name: N/A  . Number of Children: 1   . Years of Education: N/A   Occupational History  . A retired Therapist, occupational    Social History Main Topics  . Smoking status: Former Smoker    Types: Cigars    Quit date: 12/29/2003  . Smokeless tobacco: Former Systems developer    Types: Chew    Quit date: 12/28/1994  . Alcohol Use: Yes     Comment: occasional beer  . Drug Use: No  . Sexual Activity: Not on file   Other Topics Concern  . Not on file   Social History Narrative  . No narrative on file    FAMILY HISTORY: Family History  Problem Relation Age of Onset  . Cancer Mother 38       cervical cancer   . Cancer Father        unknown cancer   . Hypertension Sister   . Cancer Sister        melanoma     ALLERGIES:  is allergic to hydrochlorothiazide; asa [aspirin]; calcium carbonate; oxycodone; amoxicillin; and indapamide.  MEDICATIONS:  Current Outpatient Medications  Medication Sig Dispense Refill  . aspirin 81 MG chewable tablet Chew 1 tablet (81 mg total) by mouth daily. (Patient taking differently: Chew 81 mg by mouth at bedtime. )    . calcium carbonate (OS-CAL) 600 MG TABS tablet Take 1,200 mg by mouth at bedtime.     . Cholecalciferol (VITAMIN D3) 1000 units CAPS Take 2,000 mg by mouth daily.    . Multiple Vitamins-Minerals (CENTRUM SILVER ULTRA MENS PO) Take 1 tablet by mouth at bedtime.     . ondansetron (ZOFRAN) 4 MG tablet TAKE 1 TABLET FOUR TIMES A DAY AS NEEDED FOR NAUSEA OR VOMITING 30 tablet 2  . potassium chloride (MICRO-K) 10 MEQ CR capsule TAKE 3 CAPSULES DAILY 90 capsule 2  . ranitidine (ZANTAC) 150 MG tablet Take 2 tablets (300 mg total) by mouth at  bedtime. 60 tablet 1  . simvastatin (ZOCOR) 20 MG tablet Take 20 mg by mouth daily.    . valACYclovir (VALTREX) 1000 MG tablet Take 1 tablet (1,000 mg total) by mouth daily. 60 tablet 2  . zolpidem (AMBIEN) 5 MG tablet Take 1 tablet (5 mg total) by mouth at bedtime as needed for sleep. 30 tablet 0  . nitroGLYCERIN (NITROSTAT) 0.4 MG SL tablet Place 0.4 mg under the tongue every 5 (five)  minutes x 3 doses as needed for chest pain. Reported on 05/19/2016    . REVLIMID 5 MG capsule Take 1 capsule (5 mg total) by mouth as directed. Take 5 mg daily for 21 days, rest 7 days. 21 capsule 2   No current facility-administered medications for this visit.     REVIEW OF SYSTEMS:   Constitutional: Denies fevers, chills or abnormal night sweats Eyes: Denies blurriness of vision, double vision or watery eyes Ears, nose, mouth, throat, and face: Denies mucositis or sore throat (+) unexpected occasional epistaxis, controlled Respiratory: Denies cough, dyspnea or wheezes Cardiovascular: Denies palpitation, chest discomfort or lower extremity swelling Gastrointestinal:  Denies nausea, heartburn or change in bowel habits Skin: Negative Lymphatics: Denies new lymphadenopathy or easy bruising Neurological: (+) occasional tingling from right neck down right arm. (+) Neuropathy in hands and toes, much improved.  Behavioral/Psych: Mood is stable, no new changes  Musculoskeletal: negative All other systems were reviewed with the patient and are negative.  PHYSICAL EXAMINATION:  ECOG PERFORMANCE STATUS: 0 BP (!) 144/63 (BP Location: Right Arm, Patient Position: Sitting)   Pulse 62   Temp 97.9 F (36.6 C) (Oral)   Resp 20   Ht 5' 9" (1.753 m)   Wt 156 lb 1.6 oz (70.8 kg)   SpO2 99%   BMI 23.05 kg/m   GENERAL: well-appearing, no distress SKIN: skin color, texture, turgor are normal, no rashes or significant lesions except to some healing rash on his left upper face, his abdomen due to shingles EYES: normal,  conjunctiva are pink and non-injected, sclera clear OROPHARYNX:no exudate, no erythema and lips, buccal mucosa, and tongue normal  NECK: supple, thyroid normal size, non-tender, without nodularity LYMPH:  no palpable lymphadenopathy in the cervical, axillary or inguinal LUNGS: clear to auscultation and percussion with normal breathing effort HEART: regular rate & rhythm and no murmurs and no lower extremity edema ABDOMEN:abdomen soft, non-tender and normal bowel sounds Musculoskeletal:no cyanosis of digits and no clubbing, mild tenderness on bilateral lateral chest wall.  PSYCH: alert & oriented x 3 with fluent speech NEURO: no focal motor/sensory deficits CHEST WALL: slightly enlarged left breast, mild tenderness, no palpable mass. He states his left breast is slightly bigger than right for all his adult life.  LABORATORY DATA:   CBC Latest Ref Rng & Units 12/27/2017 10/22/2017 09/17/2017  WBC 4.0 - 10.3 10e3/uL 5.1 4.1 3.6(L)  Hemoglobin 13.0 - 17.1 g/dL 16.8 16.0 16.0  Hematocrit 38.4 - 49.9 % 50.6(H) 47.7 47.4  Platelets 140 - 400 10e3/uL 164 211 198   CMP Latest Ref Rng & Units 12/27/2017 10/22/2017 09/17/2017  Glucose 70 - 140 mg/dl 96 103 101  BUN 7.0 - 26.0 mg/dL 7.6 10.1 9.3  Creatinine 0.7 - 1.3 mg/dL 1.3 1.3 1.3  Sodium 136 - 145 mEq/L 138 140 138  Potassium 3.5 - 5.1 mEq/L 3.5 3.8 3.4(L)  Chloride 101 - 111 mmol/L - - -  CO2 22 - 29 mEq/L _0 Calcium 8.4 - 10.4 mg/dL 9.4 9.5 9.6  Total Protein 6.4 - 8.3 g/dL 7.0 7.0 6.8  Total Bilirubin 0.20 - 1.20 mg/dL 0.61 0.87 0.52  Alkaline Phos 40 - 150 U/L 108 106 105  AST 5 - 34 U/L 43(H) 34 28  ALT 0 - 55 U/L 65(H) 48 28    MM lab: SPEP M-protein (g/dl) 03/15/2015: 1.6 05/24/2015: 0.5 06/21/2015: 0.4 07/18/2015 (at  [facility]): 0.33 11/27/2015: not det 01/21/2016: not det  03/24/2016: not det 05/19/2016: not det  08/07/2016: not det  10/30/2017: not det 03/18/2017: Not det 06/25/17: not det.  09/17/17: not det.    Serum  IgG (mg/dl) 03/15/2015: 2000 05/24/2015: 579 06/21/2015: 673 07/18/2015 ([facility]): 509 11/27/2015: 396 01/21/2016: 487 03/24/2016: 563 05/19/2016: 600 08/07/2016: 530 10/30/2016: 589 03/18/2017: 584 06/25/17: 645 09/17/17: 542  Serum kappa/lamda/ratio (mg/dl)  03/15/2015: 690, 0.13, 5308 05/24/2015: 146, 1.27, 115.0 06/21/2015: 86, 0.74, 116.2 01/21/2016: 1.09, 1.21, 0.9 03/24/2016: 1.72, 1.49, 1.16 05/19/2016: 1.91, 1.50, 1.27 08/07/2016: 1.36, 1.41, 0.96  10/30/2016: 1.6, 1.32, 1.21  03/18/2017: 1.67, 1.55, 1.08 06/25/17: 1.51, 1.46, 1.03 09/17/17: 1.14, 1.17, 0.97  24 h urine kappa light chain (mg) 03/18/2015: 2434 05/28/2015: 582 06/24/2015: 322 04/27/2016: 499  10/22/17: PENDING    PATHOLOGY REPORT Bone Marrow, Aspirate,Biopsy, and Clot, right iliac 03/27/2015 - HYPERCELLULAR BONE MARROW FOR AGE WITH PLASMA CELL NEOPLASM. - SEE COMMENT. PERIPHERAL BLOOD: - NO SIGNIFICANT MORPHOLOGIC ABNORMALITIES. Diagnosis Note The bone marrow shows increased number of atypical plasma cells representing 47% of all cells in the aspirate associated with prominent interstitial infiltrates and variably sized aggregates in the clot and biopsy sections. Immunohistochemical stains show that the plasma cells are kappa light chain restricted consistent with plasma cell neoplasm. The background shows trilineage Cytogenetics: normal FISH panel: +4 and +11  Bone Marrow, Aspirate,Biopsy, and Clot, 11/27/2015 at [place]: [id number] RECEIVED: 11/27/2015 ORDERING PHYSICIAN: CESAR Melburn Hake , MD PATIENT NAME: Eric Dudley BONE MARROW REPORT Bone Marrow (BM) and Peripheral Blood (PB) FINAL PATHOLOGIC DIAGNOSIS BONE MARROW: Hypocellular marrow (10%) with no increase in plasma cells. See comment. PERIPHERAL BLOOD: Absolute lymphopenia. See CBC data. COMMENT: The touch prep is cellular and adequate for evaluation. Rare plasma cells are present (<1%). Blasts are not increased,  Myeloid and erythroid cells are present in normal proportions with intact maturation and no overt dysplastic changes. Megakaryocytes are absent. The aspirate smears are aspicular and hemodilute. Bone marrow and clot sections are variably hypocellular (0-20% range, 10% overall). Scattered interstitial plasma cells are present and comprise <5% of marrow cellularity by CD138 immunohistochemical analysis. Blasts are not increased. Myeloid and erythroid cells are present in normal proportions and have no overt morphologic abnormalities. Megakaryocytes are morphologically within normal limits. Examination of the peripheral blood reveals absolute lymphopenia. There are no circulating plasma cells or evidence of rouleaux formation.   RADIOGRAPHIC STUDIES:  Chest Xray 08/31/17 IMPRESSION: 1. Left perihilar consolidation suspicious for pneumonia in the setting of fever. Followup PA and lateral chest X-ray is recommended in 3-4 weeks following trial of antibiotic therapy to ensure resolution and exclude underlying malignancy. 2. Small well-circumscribed nodular opacity projecting over the right lung base may be a nipple shadow, pulmonary nodule is not entirely excluded. Consider including nipple markers on follow-up exam.  ASSESSMENT & PLAN:  66 y.o. Caucasian male with past medical history of hypertension, tonsil cancer status post surgery and radiation 10 years ago, now presented with 3-4 months low back pain and intermittent rib pain. His blood test showed M protein 1.7g/dl.  1. IgG multiple myeloma, stage I, standard risk by cytogenetics (+4 and +11), in remission  -I previously discussed his initial bone marrow biopsy results with him. He has significant amount of plasma cells in the bone marrow 47%.  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 Based on the new notable myeloma diagnostic criteria, he meets the criteria of multiple myeloma. He has normal albumin and miildly elevated beta 2 microglobulin, this is stage I. -His cytogenetics and Fish panel revealed standard risk. -His thoracic and lumbar spine MRI showed pathologic compression fracture at T12, focal myeloma deposits at L3 and T9, diffuse marrow signal abnormality consistent with myeloma -I previously recommended induction chemotherapy with VRD regimen, weekly Velcade and dexamethasone, first 2 cycles Revlimid dose reduced to 10 mg daily 3 week on, one-week off, based on his renal function, and it which was increased to full dose from cycle 3. He received a total of 4 cycles of treatment, achieved a very good partial response. Repeat bone marrow on 07/18/2015 showed 5% plasma cells, fish and cytogenetics was normal. -His post transplant evaluation previously showed complete remission (<1% plasma cell in marrow, undetectable M protein), and he has recovered very well. -He previously started maintenance Revlimid 5 mg daily, 3 weeks on, 1 week off, tolerating well, we'll continue. - previously Discussed possibility of Revlimid may iIncreasing risk of secondary cancer occurring.  -Colonoscopy in 2020, he does PSA screening at [facility]  -he has completed Zometa every 3 months for a total of 2 years  -continue acyclovir for herpes suppression  -His lab has indicating he remains to be in remission  -Held Revlimid due to hospitalization 08/31/2017 after immunization shots while at [facility].  -Restarted maintenance Revlimid on 09/18/17, tolerating well -His disease is standard risk and he has been on maintenance Revlimid for almost 2 years now. I encourage pt to discuss stopping maintenance with Dr. Norma Fredrickson as his M-Protein remains undetected and he is still in remission. He has appointment in  August 2019 -Labs reviewed with patient and are adequate to continue revlimid.  -Labs from today, 12/27/2017 reviewed with patient  -Will refill Revlimid today.  -F/u in 3 months     2. Disseminated Shingles in 03/2017 -He was admitted to the hospital for Shingles on 4/26-4/30 -This flared due to compromised immune system while on Revlimid -We will restart acyclovir 1015m daily for prophylaxis -Advised that the patient continue on Acyclovir while on Revlimid.  -Will refill Acyclovir today.  -Will follow up with PCP to review when to stop treatment  3. T12 pathologic compression fracture, and diffuse myeloma involvement in bones -Status post kyphoplasty and palliative radiation  -He previously received palliative Radiation, pain much improved overall. He does complain more mid back and right-sided chest pain daily, likely related to his physical activities. -continue zometa infusion every 3 months, he has been seeing his dentist regularly. -He completed Zometa 09/17/17   4. nonobstructive CAD, STEMI, Non ischemic CM, Takatsubo, with EF of 35-40% -Patient was initially found to have troponin elevation and anterior apical ST elevation MI and resultant cardiomyopathy on 04/26/15 -He previously underwent a cardiac catheterization on 5/3 which showed mild diffuse disease, no stents were placed. He is on aspirin -follow up with Dr. HDebara Pickett -He has recovered well. Repeat echo before transplant showed EF 55-60% in 11/2015   5. Peripheral neuropathy, grade 1 -Likely secondary to his bone marrow transplant chemotherapy -Mild and Stable overall, we'll continue observation  6. Advanced directives -his advanced directives is in EPIC -he is full code.  7. ? Jaw necrosis  -he has had some right side jaw/gum pain lately, examed by her dentist, possible jaw necrosis which could be related to zometa  -he has completed 2 year zometa in 08/2017  Plan -continue maintenance Revlimid, refilled  today  -lab and f/u in 3 months    All questions were answered. The patient knows to call the clinic with any problems, questions or concerns.  I spent 20 minutes counseling the patient face to face. The total time spent in the appointment was 25 minutes and more than 50% was on counseling.     Eric Merle, MD 12/27/2017     This document serves as a record of services personally performed by Eric Merle, MD. It was created on her behalf by Steva Colder, a trained medical scribe. The creation of this record is based on the scribe'Dudley personal observations and the provider'Dudley statements to them.   I have reviewed the above documentation for accuracy and completeness, and I agree with the above.

## 2017-12-27 ENCOUNTER — Other Ambulatory Visit: Payer: Self-pay | Admitting: *Deleted

## 2017-12-27 ENCOUNTER — Ambulatory Visit (HOSPITAL_BASED_OUTPATIENT_CLINIC_OR_DEPARTMENT_OTHER): Payer: Medicare Other | Admitting: Hematology

## 2017-12-27 ENCOUNTER — Other Ambulatory Visit (HOSPITAL_BASED_OUTPATIENT_CLINIC_OR_DEPARTMENT_OTHER): Payer: Medicare Other

## 2017-12-27 ENCOUNTER — Encounter: Payer: Self-pay | Admitting: Hematology

## 2017-12-27 ENCOUNTER — Telehealth: Payer: Self-pay | Admitting: Hematology

## 2017-12-27 VITALS — BP 144/63 | HR 62 | Temp 97.9°F | Resp 20 | Ht 69.0 in | Wt 156.1 lb

## 2017-12-27 DIAGNOSIS — C9001 Multiple myeloma in remission: Secondary | ICD-10-CM

## 2017-12-27 DIAGNOSIS — I10 Essential (primary) hypertension: Secondary | ICD-10-CM

## 2017-12-27 DIAGNOSIS — G893 Neoplasm related pain (acute) (chronic): Secondary | ICD-10-CM

## 2017-12-27 DIAGNOSIS — Z923 Personal history of irradiation: Secondary | ICD-10-CM

## 2017-12-27 DIAGNOSIS — M4850XA Collapsed vertebra, not elsewhere classified, site unspecified, initial encounter for fracture: Secondary | ICD-10-CM

## 2017-12-27 DIAGNOSIS — I428 Other cardiomyopathies: Secondary | ICD-10-CM

## 2017-12-27 DIAGNOSIS — G620 Drug-induced polyneuropathy: Secondary | ICD-10-CM | POA: Diagnosis not present

## 2017-12-27 DIAGNOSIS — N182 Chronic kidney disease, stage 2 (mild): Secondary | ICD-10-CM

## 2017-12-27 LAB — CBC WITH DIFFERENTIAL/PLATELET
BASO%: 0.3 % (ref 0.0–2.0)
Basophils Absolute: 0 10*3/uL (ref 0.0–0.1)
EOS ABS: 0.3 10*3/uL (ref 0.0–0.5)
EOS%: 5.9 % (ref 0.0–7.0)
HCT: 50.6 % — ABNORMAL HIGH (ref 38.4–49.9)
HEMOGLOBIN: 16.8 g/dL (ref 13.0–17.1)
LYMPH%: 15.2 % (ref 14.0–49.0)
MCH: 36.3 pg — ABNORMAL HIGH (ref 27.2–33.4)
MCHC: 33.2 g/dL (ref 32.0–36.0)
MCV: 109.4 fL — AB (ref 79.3–98.0)
MONO#: 0.9 10*3/uL (ref 0.1–0.9)
MONO%: 18.3 % — AB (ref 0.0–14.0)
NEUT%: 60.3 % (ref 39.0–75.0)
NEUTROS ABS: 3.1 10*3/uL (ref 1.5–6.5)
Platelets: 164 10*3/uL (ref 140–400)
RBC: 4.63 10*6/uL (ref 4.20–5.82)
RDW: 15 % — AB (ref 11.0–14.6)
WBC: 5.1 10*3/uL (ref 4.0–10.3)
lymph#: 0.8 10*3/uL — ABNORMAL LOW (ref 0.9–3.3)

## 2017-12-27 LAB — COMPREHENSIVE METABOLIC PANEL
ALBUMIN: 4 g/dL (ref 3.5–5.0)
ALK PHOS: 108 U/L (ref 40–150)
ALT: 65 U/L — AB (ref 0–55)
AST: 43 U/L — AB (ref 5–34)
Anion Gap: 8 mEq/L (ref 3–11)
BILIRUBIN TOTAL: 0.61 mg/dL (ref 0.20–1.20)
BUN: 7.6 mg/dL (ref 7.0–26.0)
CO2: 25 meq/L (ref 22–29)
CREATININE: 1.3 mg/dL (ref 0.7–1.3)
Calcium: 9.4 mg/dL (ref 8.4–10.4)
Chloride: 106 mEq/L (ref 98–109)
EGFR: 56 mL/min/{1.73_m2} — AB (ref >60)
GLUCOSE: 96 mg/dL (ref 70–140)
Potassium: 3.5 mEq/L (ref 3.5–5.1)
SODIUM: 138 meq/L (ref 136–145)
TOTAL PROTEIN: 7 g/dL (ref 6.4–8.3)

## 2017-12-27 MED ORDER — VALACYCLOVIR HCL 1 G PO TABS: 1000.0000 mg | ORAL_TABLET | Freq: Every day | ORAL | 2 refills | Status: DC

## 2017-12-27 MED ORDER — REVLIMID 5 MG PO CAPS: 5.0000 mg | ORAL_CAPSULE | ORAL | 2 refills | Status: DC

## 2017-12-27 NOTE — Telephone Encounter (Signed)
Gave patient avs and calendar with appts per 12/31 los.  °

## 2017-12-29 LAB — KAPPA/LAMBDA LIGHT CHAINS
IG KAPPA FREE LIGHT CHAIN: 17.4 mg/L (ref 3.3–19.4)
Ig Lambda Free Light Chain: 19 mg/L (ref 5.7–26.3)
Kappa/Lambda FluidC Ratio: 0.92 (ref 0.26–1.65)

## 2017-12-30 LAB — MULTIPLE MYELOMA PANEL, SERUM
ALBUMIN SERPL ELPH-MCNC: 3.9 g/dL (ref 2.9–4.4)
ALBUMIN/GLOB SERPL: 1.5 (ref 0.7–1.7)
ALPHA 1: 0.1 g/dL (ref 0.0–0.4)
ALPHA2 GLOB SERPL ELPH-MCNC: 0.9 g/dL (ref 0.4–1.0)
B-Globulin SerPl Elph-Mcnc: 1 g/dL (ref 0.7–1.3)
Gamma Glob SerPl Elph-Mcnc: 0.7 g/dL (ref 0.4–1.8)
Globulin, Total: 2.7 g/dL (ref 2.2–3.9)
IGA/IMMUNOGLOBULIN A, SERUM: 10 mg/dL — AB (ref 61–437)
IGG (IMMUNOGLOBIN G), SERUM: 705 mg/dL (ref 700–1600)
IGM (IMMUNOGLOBIN M), SRM: 29 mg/dL (ref 20–172)
M Protein SerPl Elph-Mcnc: 0.2 g/dL — ABNORMAL HIGH
Total Protein: 6.6 g/dL (ref 6.0–8.5)

## 2018-01-04 ENCOUNTER — Encounter: Payer: Self-pay | Admitting: Internal Medicine

## 2018-01-04 ENCOUNTER — Ambulatory Visit (INDEPENDENT_AMBULATORY_CARE_PROVIDER_SITE_OTHER): Payer: Medicare Other | Admitting: Internal Medicine

## 2018-01-04 VITALS — BP 118/70 | HR 54 | Ht 69.0 in | Wt 155.6 lb

## 2018-01-04 DIAGNOSIS — C9001 Multiple myeloma in remission: Secondary | ICD-10-CM | POA: Diagnosis not present

## 2018-01-04 DIAGNOSIS — I10 Essential (primary) hypertension: Secondary | ICD-10-CM

## 2018-01-04 DIAGNOSIS — E782 Mixed hyperlipidemia: Secondary | ICD-10-CM | POA: Diagnosis not present

## 2018-01-04 DIAGNOSIS — I428 Other cardiomyopathies: Secondary | ICD-10-CM

## 2018-01-04 NOTE — Patient Instructions (Signed)
Your physician wants you to follow-up in: ONE YEAR with Dr. Hilty. You will receive a reminder letter in the mail two months in advance. If you don't receive a letter, please call our office to schedule the follow-up appointment.  

## 2018-01-04 NOTE — Progress Notes (Signed)
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 He has been on Vytorin 10/40 mg. Recently, a lipid NMR demonstrated a high particle number at 1323 with an LDL cholesterol of 67, indicating increased risk. He also underwent a [place] heart protocol per your request, which showed an elevated lipoprotein(a) at 155 and elevated insulin level, suggesting early insulin resistance. Hemoglobin was 5.8. APOB levels were mildly elevated as well as APOA1 fractions. However, overall cholesterol control is fairly good. In the past, I had recommended changing him to Crestor; however, apparently he was intolerant to this in the past as well as Lipitor and another statin, which he does not recall. He is on low-dose Diovan for hypertension and had been on Norvasc in the past. The Diovan was apparently started by Dr. Einar Gip prior to my caring for him and I feel that this is a reasonable medicine for blood pressure control. Symptomatically, he denies any chest pain, shortness of breath, palpitations, presyncope, or syncopal symptoms.   I the pleasure see Mr. Eric Dudley back in the office today. He was recently hospitalized after presenting with a syncopal event. He was admitted on 4/19 for acute back pain where he was found to have a pathological T12 fracture IR consulted and recommended kyphoplasty which was performed on 04/19/15.Patient came to his regular scheduled injection of Velcade at the cancer center 4/29 and was being mapped for XRT and while being transferred from chair to table he was dropped down below usual 30. Subsequent to this he had an episode wherein he had severe pain and became unresponsive for about 20 seconds. EKG demonstrated anteroapical ST  elevation and he had car catheterization on 04/30/2015. This demonstratedProx RCA lesion, 30% stenosed. Ost LM to LM lesion, 30% stenosed, normal LVEDP. Echocardiography, however demonstrated reduced LV function with an EF of 35-40% and a large anteroapical aneurysm. The etiology of this aneurysm is not quite clear but thought to be related to prior radiation therapy and or chemotherapy for tonsillar cancer. He did suffer an NSTEMI, with troponin elevation to greater than 4. He was placed on aspirin and appropriately Plavix. There was some question as to whether he needed to continue this, however based on the CURE trial data, there is indication for therapy at least one year even in patients who did not have prior stenting. There is also new are data using Brilinta with similar if not better outcome data. Currently he reports some fatigue and had an episode yesterday where he vomited number of times and is somewhat weak today. Blood pressure is low normal.  Eric Dudley returns today for follow-up. He has been diagnosed with multiple myeloma and recently underwent bone marrow transplant. During that time he had congestive heart failure exacerbation. Prior to transplant his EF has improved and in fact a repeat echocardiogram performed on 9 7 2016 demonstrated an EF of 65-70%.  He's not currently on heart failure medication other than low-dose metoprolol XL 12.5 mg daily due to hypotension.  He is also on low-dose aspirin 81 mg and Plavix 75 mg.   As a reminder, there is no significant obstructive disease by cath in April and the dual antiplatelets therapy is for non-STEMI..  Mr. Eric Dudley was seen back in the office  today for follow-up. He continues to have symptoms of lower orthostatic hypotension. Orthostatic vitals today indicated a drop of blood pressure by 30 mmHg. He's also noted to be tachycardic. He was dizzy with change in position. As last office visit I stopped his Aldactone which he says has not made a  significant difference in his symptoms. He is on low-dose beta blocker but remains tachycardic. I suspect this may be related to either multiple myeloma or his recent stem cell transplant. He recently had repeat echocardiogram as described above which showed improvement in LV function.  Eric Dudley reports a marked improvement in his orthostatic symptoms on midodrine. We have discontinued all of blood pressure medicines however for some reason he received valsartan in the mail. Fortunately he did not take this medicine. In generally very pleased with the midodrine and its improvement in his blood pressures. He is now able to do most activities and seems to be improving. The etiology of his low blood pressures is not quite clear. I would like to check a repeat limited echo to look for changes in LV function.  05/27/2016  Eric Dudley returns today for follow-up. He is doing exceedingly well after his bone marrow transplant. He is no longer struggling with hypotension and is come off of midodrine. Blood pressure is normal today with a heart rate in the upper 50s. He is not back on any blood pressure medications. As previously mentioned his LVEF has normalized. He reports some mild shortness of breath but that continues to improve day by day. His strength is also getting better.  12/08/2016  Eric Dudley was seen today in follow-up. Overall he is doing exceedingly well. As recently reported his LVEF has normalized on echo. He has since been very active including going flyfishing and dear hunting in [place]. He is no longer having problems with hypotension. Blood pressure has normalized and heart rate remains in the upper 50s. This is despite any medications for heart failure and I suspect he may have had stress-induced cardiomyopathy.  01/04/2018  Eric Dudley returns today for follow-up.  I last saw him about a year ago.  Over the past year he said he was stricken with shingles in the early spring and then several  months later had a pneumonia, not long after getting revaccinated.  He was hospitalized and treated for that.  Otherwise has been doing well.  He recently got back from a hunting trip in [place].  He denies any chest pain or worsening shortness of breath.  He recently had a repeat echo which showed stable LVEF of 60-65% and grade 2 diastolic dysfunction.  Again he is asymptomatic.  The pressure initially was elevated today however came down with a recheck to 118/70.  Cholesterol is followed by his PCP and he reported his labs have been well controlled.  We did obtain those laboratory values indicating an LDL of 184 and total cholesterol 280 as of 05/31/2017.  This is apparently on simvastatin.  Even without a history of coronary artery disease his goal LDL would be less than 100 or 50% reduction from baseline.  PMHx:  Past Medical History:  Diagnosis Date  . Arthritis    neck and back pain  . Cholelithiases   . Chronic kidney disease    MILD,CHRONIC  . Hyperlipidemia   . Hypertension   . Multiple myeloma ([place])   . Tonsillar cancer ([place]) 2006    Past Surgical History:  Procedure Laterality Date  . APPENDECTOMY  1962  .  CARDIAC CATHETERIZATION N/A 04/30/2015   Procedure: Left Heart Cath and Coronary Angiography;  Surgeon: Peter M Martinique, MD;  Location: [facility] INVASIVE CV LAB CUPID;  Service: Cardiovascular;  Laterality: N/A;  . CARDIOLITE MYOCARDIAL PERFUSION STUDY  04/16/03   NEGITIVE BRUCE PROTOCAL EXERCISE STRESS TEST. EF 70%. NO ISCHEMIA.  [name] KNEE SURGERY  2012   right  . SHOULDER SURGERY  1999   left    FAMHx:  Family History  Problem Relation Age of Onset  . Cancer Mother 40       cervical cancer   . Cancer Father        unknown cancer   . Hypertension Sister   . Cancer Sister        melanoma     SOCHx:   reports that he quit smoking about 14 years ago. His smoking use included cigars. He quit smokeless tobacco use about 23 years ago. His smokeless tobacco use included chew. He  reports that he does not drink alcohol or use drugs.  ALLERGIES:  Allergies  Allergen Reactions  . Hydrochlorothiazide Rash and Palpitations  . Asa [Aspirin] Nausea Only  . Calcium Carbonate Nausea And Vomiting  . Oxycodone Other (See Comments)    Patient does not want to take. Severe Hallucinations.  . Amoxicillin Itching and Rash  . Indapamide Palpitations    Lightheadedness, dizziness    ROS: Pertinent items noted in HPI and remainder of comprehensive ROS otherwise negative.  HOME MEDS: Current Outpatient Medications  Medication Sig Dispense Refill  . aspirin 81 MG chewable tablet Chew 1 tablet (81 mg total) by mouth daily. (Patient taking differently: Chew 81 mg by mouth at bedtime. )    . calcium carbonate (OS-CAL) 600 MG TABS tablet Take 1,200 mg by mouth at bedtime.     . Cholecalciferol (VITAMIN D3) 1000 units CAPS Take 2,000 mg by mouth daily.    . Multiple Vitamins-Minerals (CENTRUM SILVER ULTRA MENS PO) Take 1 tablet by mouth at bedtime.     . nitroGLYCERIN (NITROSTAT) 0.4 MG SL tablet Place 0.4 mg under the tongue every 5 (five) minutes x 3 doses as needed for chest pain. Reported on 05/19/2016    . ondansetron (ZOFRAN) 4 MG tablet TAKE 1 TABLET FOUR TIMES A DAY AS NEEDED FOR NAUSEA OR VOMITING 30 tablet 2  . potassium chloride (MICRO-K) 10 MEQ CR capsule TAKE 3 CAPSULES DAILY 90 capsule 2  . ranitidine (ZANTAC) 150 MG tablet Take 2 tablets (300 mg total) by mouth at bedtime. 60 tablet 1  . REVLIMID 5 MG capsule Take 1 capsule (5 mg total) by mouth as directed. Take 5 mg daily for 21 days, rest 7 days. 21 capsule 2  . simvastatin (ZOCOR) 20 MG tablet Take 20 mg by mouth daily.    . valACYclovir (VALTREX) 1000 MG tablet Take 1 tablet (1,000 mg total) by mouth daily. 60 tablet 2  . zolpidem (AMBIEN) 5 MG tablet Take 1 tablet (5 mg total) by mouth at bedtime as needed for sleep. 30 tablet 0   No current facility-administered medications for this visit.      LABS/IMAGING: No results found for this or any previous visit (from the past 48 hour(s)). No results found.  VITALS: Ht _0  (1.753 m)   Wt 155 lb 9.6 oz (70.6 kg)   BMI 22.98 kg/m   EXAM: General appearance: alert and no distress Neck: no carotid bruit, no JVD and thyroid not enlarged, symmetric, no tenderness/mass/nodules Lungs: clear to auscultation  bilaterally Heart: regular rate and rhythm, S1, S2 normal, no murmur, click, rub or gallop Abdomen: soft, non-tender; bowel sounds normal; no masses,  no organomegaly Extremities: extremities normal, atraumatic, no cyanosis or edema Pulses: 2+ and symmetric Skin: Skin color, texture, turgor normal. No rashes or lesions Neurologic: Grossly normal Psych: Pleasant  EKG: Sinus bradycardia with PACs at 54-personally reviewed  ASSESSMENT: 1. Nonischemic cardiomyopathy, EF 35-40% - improved to 65-70% by echo at [facility] on 09/04/15 2. 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 Hypertension 4. Dyslipidemia 5. History of tonsillar cancer 6. Multiple myeloma-s/p BMT 7. CKD2 8. Orthostatic hypotension - resolved, off of midodrine  PLAN: 1.   Mr. Casanas is asymptomatic and has had a stable LVEF with recent echo showing an EF of 60-65%.  Blood pressure is at goal and he is not on current medications.  He has a dyslipidemia which is been treated with simvastatin, however, LDL in June 2018 was elevated at 184 with total cholesterol 280, triglycerides 133 and HDL 38. This will need to be reassessed and I will likely recommend a change in his medication.  He has had no further episodes of orthostatic hypotension.  Overall he seems to be doing very well.  Follow-up with me annually or sooner as necessary.  Pixie Casino, MD, [facility], [place] Director of the [facility] Lipid [facility] &  Cardiovascular [facility] Reduction [facility] Diplomate of the American Board of Clinical Lipidology Attending  Cardiologist  Direct Dial: [phone]  Fax: [phone]  Website:  [email or web address] 01/04/2018, 1:28 PM

## 2018-01-07 ENCOUNTER — Telehealth: Payer: Self-pay | Admitting: Internal Medicine

## 2018-01-07 NOTE — Telephone Encounter (Signed)
Patient called and notified that MD would like him to have a fasting lipid panel done to check cholesterol. Labs were obtained from [place] and LDL & total chol was elevated (see MD note for reference). Patient aware of lab location, hours and that no appointment is needed.

## 2018-01-09 ENCOUNTER — Other Ambulatory Visit: Payer: Self-pay | Admitting: Hematology

## 2018-01-09 DIAGNOSIS — C9001 Multiple myeloma in remission: Secondary | ICD-10-CM

## 2018-01-10 ENCOUNTER — Telehealth: Payer: Self-pay | Admitting: *Deleted

## 2018-01-10 ENCOUNTER — Telehealth: Payer: Self-pay | Admitting: Hematology

## 2018-01-10 DIAGNOSIS — L570 Actinic keratosis: Secondary | ICD-10-CM | POA: Diagnosis not present

## 2018-01-10 DIAGNOSIS — B078 Other viral warts: Secondary | ICD-10-CM | POA: Diagnosis not present

## 2018-01-10 DIAGNOSIS — E782 Mixed hyperlipidemia: Secondary | ICD-10-CM | POA: Diagnosis not present

## 2018-01-10 DIAGNOSIS — X32XXXD Exposure to sunlight, subsequent encounter: Secondary | ICD-10-CM | POA: Diagnosis not present

## 2018-01-10 NOTE — Telephone Encounter (Signed)
Pt left vm asking why he received a call for repeat labs 01/17/18.  Reviewed chart & In basket message from Dr Burr Medico noted.  Called pt back & informed of detectable M-protein & need to repeat lab in 2 wks.  Pt expressed understanding.

## 2018-01-10 NOTE — Telephone Encounter (Signed)
-----   Message from Truitt Merle, MD sent at 01/09/2018  8:01 PM EST ----- Please let pt know that her M-protein became detectable again on his last lab test, I will schedule him for repeated lab in 2 weeks. Thanks  Truitt Merle  01/09/2018

## 2018-01-10 NOTE — Telephone Encounter (Signed)
Spoke to patients wife regarding upcoming January appointments per 1/13 sch message

## 2018-01-11 LAB — LIPID PANEL
CHOLESTEROL TOTAL: 185 mg/dL (ref 100–199)
Chol/HDL Ratio: 3.4 ratio (ref 0.0–5.0)
HDL: 55 mg/dL (ref >39)
LDL CALC: 96 mg/dL (ref 0–99)
Triglycerides: 172 mg/dL — ABNORMAL HIGH (ref 0–149)
VLDL CHOLESTEROL CAL: 34 mg/dL (ref 5–40)

## 2018-01-17 ENCOUNTER — Inpatient Hospital Stay: Payer: Medicare Other | Attending: Hematology

## 2018-01-17 DIAGNOSIS — C9001 Multiple myeloma in remission: Secondary | ICD-10-CM | POA: Diagnosis not present

## 2018-01-17 LAB — COMPREHENSIVE METABOLIC PANEL
ALK PHOS: 112 U/L (ref 40–150)
ALT: 55 U/L (ref 0–55)
ANION GAP: 8 (ref 3–11)
AST: 35 U/L — AB (ref 5–34)
Albumin: 4.1 g/dL (ref 3.5–5.0)
BUN: 10 mg/dL (ref 7–26)
CO2: 27 mmol/L (ref 22–29)
Calcium: 9.6 mg/dL (ref 8.4–10.4)
Chloride: 105 mmol/L (ref 98–109)
Creatinine, Ser: 1.25 mg/dL (ref 0.70–1.30)
GFR calc Af Amer: >60 mL/min (ref >60)
GFR, EST NON AFRICAN AMERICAN: 57 mL/min — AB (ref >60)
GLUCOSE: 92 mg/dL (ref 70–140)
POTASSIUM: 3.5 mmol/L (ref 3.5–5.1)
Sodium: 140 mmol/L (ref 136–145)
Total Bilirubin: 0.6 mg/dL (ref 0.2–1.2)
Total Protein: 7.2 g/dL (ref 6.4–8.3)

## 2018-01-26 ENCOUNTER — Other Ambulatory Visit: Payer: Self-pay | Admitting: *Deleted

## 2018-01-26 DIAGNOSIS — C9001 Multiple myeloma in remission: Secondary | ICD-10-CM

## 2018-01-26 MED ORDER — REVLIMID 5 MG PO CAPS: 5.0000 mg | ORAL_CAPSULE | ORAL | 0 refills | Status: DC

## 2018-02-22 ENCOUNTER — Other Ambulatory Visit: Payer: Self-pay | Admitting: *Deleted

## 2018-02-22 DIAGNOSIS — C9001 Multiple myeloma in remission: Secondary | ICD-10-CM

## 2018-02-23 ENCOUNTER — Telehealth: Payer: Self-pay | Admitting: Hematology

## 2018-02-23 MED ORDER — REVLIMID 5 MG PO CAPS: 5.0000 mg | ORAL_CAPSULE | ORAL | 0 refills | Status: DC

## 2018-02-23 NOTE — Telephone Encounter (Signed)
Scheduled appt per 2/26 sch message - patient is aware of appt time and date.

## 2018-03-03 ENCOUNTER — Inpatient Hospital Stay: Payer: Medicare Other | Attending: Hematology

## 2018-03-03 DIAGNOSIS — E876 Hypokalemia: Secondary | ICD-10-CM | POA: Diagnosis not present

## 2018-03-03 DIAGNOSIS — I429 Cardiomyopathy, unspecified: Secondary | ICD-10-CM | POA: Diagnosis not present

## 2018-03-03 DIAGNOSIS — C9001 Multiple myeloma in remission: Secondary | ICD-10-CM | POA: Diagnosis not present

## 2018-03-03 DIAGNOSIS — G620 Drug-induced polyneuropathy: Secondary | ICD-10-CM | POA: Insufficient documentation

## 2018-03-03 LAB — COMPREHENSIVE METABOLIC PANEL
ALBUMIN: 3.7 g/dL (ref 3.5–5.0)
ALT: 55 U/L (ref 0–55)
AST: 34 U/L (ref 5–34)
Alkaline Phosphatase: 116 U/L (ref 40–150)
Anion gap: 8 (ref 3–11)
BUN: 9 mg/dL (ref 7–26)
CHLORIDE: 108 mmol/L (ref 98–109)
CO2: 20 mmol/L — AB (ref 22–29)
CREATININE: 1.3 mg/dL (ref 0.70–1.30)
Calcium: 9.3 mg/dL (ref 8.4–10.4)
GFR calc non Af Amer: 55 mL/min — ABNORMAL LOW (ref >60)
GLUCOSE: 92 mg/dL (ref 70–140)
Potassium: 3.3 mmol/L — ABNORMAL LOW (ref 3.5–5.1)
SODIUM: 136 mmol/L (ref 136–145)
Total Bilirubin: 0.6 mg/dL (ref 0.2–1.2)
Total Protein: 6.9 g/dL (ref 6.4–8.3)

## 2018-03-03 LAB — CBC WITH DIFFERENTIAL/PLATELET
BASOS ABS: 0 10*3/uL (ref 0.0–0.1)
BASOS PCT: 0 %
EOS ABS: 0.1 10*3/uL (ref 0.0–0.5)
EOS PCT: 1 %
HCT: 47.6 % (ref 38.4–49.9)
Hemoglobin: 16.5 g/dL (ref 13.0–17.1)
LYMPHS ABS: 0.7 10*3/uL — AB (ref 0.9–3.3)
Lymphocytes Relative: 14 %
MCH: 37.2 pg — ABNORMAL HIGH (ref 27.2–33.4)
MCHC: 34.7 g/dL (ref 32.0–36.0)
MCV: 107.2 fL — ABNORMAL HIGH (ref 79.3–98.0)
Monocytes Absolute: 1.2 10*3/uL — ABNORMAL HIGH (ref 0.1–0.9)
Monocytes Relative: 22 %
NEUTROS PCT: 63 %
Neutro Abs: 3.4 10*3/uL (ref 1.5–6.5)
PLATELETS: 178 10*3/uL (ref 140–400)
RBC: 4.44 MIL/uL (ref 4.20–5.82)
RDW: 14.5 % (ref 11.0–14.6)
WBC: 5.3 10*3/uL (ref 4.0–10.3)

## 2018-03-07 DIAGNOSIS — E782 Mixed hyperlipidemia: Secondary | ICD-10-CM | POA: Diagnosis not present

## 2018-03-07 DIAGNOSIS — I129 Hypertensive chronic kidney disease with stage 1 through stage 4 chronic kidney disease, or unspecified chronic kidney disease: Secondary | ICD-10-CM | POA: Diagnosis not present

## 2018-03-07 DIAGNOSIS — E538 Deficiency of other specified B group vitamins: Secondary | ICD-10-CM | POA: Diagnosis not present

## 2018-03-07 LAB — MULTIPLE MYELOMA PANEL, SERUM
ALBUMIN SERPL ELPH-MCNC: 3.5 g/dL (ref 2.9–4.4)
ALPHA2 GLOB SERPL ELPH-MCNC: 1.1 g/dL — AB (ref 0.4–1.0)
Albumin/Glob SerPl: 1.2 (ref 0.7–1.7)
Alpha 1: 0.2 g/dL (ref 0.0–0.4)
B-GLOBULIN SERPL ELPH-MCNC: 1 g/dL (ref 0.7–1.3)
GAMMA GLOB SERPL ELPH-MCNC: 0.7 g/dL (ref 0.4–1.8)
GLOBULIN, TOTAL: 3 g/dL (ref 2.2–3.9)
IGG (IMMUNOGLOBIN G), SERUM: 660 mg/dL — AB (ref 700–1600)
IgA: 8 mg/dL — ABNORMAL LOW (ref 61–437)
IgM (Immunoglobulin M), Srm: 27 mg/dL (ref 20–172)
M PROTEIN SERPL ELPH-MCNC: 0.1 g/dL — AB
TOTAL PROTEIN ELP: 6.5 g/dL (ref 6.0–8.5)

## 2018-03-14 DIAGNOSIS — I129 Hypertensive chronic kidney disease with stage 1 through stage 4 chronic kidney disease, or unspecified chronic kidney disease: Secondary | ICD-10-CM | POA: Diagnosis not present

## 2018-03-14 DIAGNOSIS — E782 Mixed hyperlipidemia: Secondary | ICD-10-CM | POA: Diagnosis not present

## 2018-03-14 DIAGNOSIS — N183 Chronic kidney disease, stage 3 (moderate): Secondary | ICD-10-CM | POA: Diagnosis not present

## 2018-03-14 DIAGNOSIS — E538 Deficiency of other specified B group vitamins: Secondary | ICD-10-CM | POA: Diagnosis not present

## 2018-03-22 NOTE — Progress Notes (Signed)
[place]  [phone] [phone]  Clinic Follow Up Note   Patient Care Team: [name], MD as PCP - General (Internal Medicine) Debara Pickett Nadean Corwin, MD as PCP - Cardiology (Cardiology) Debara Pickett Nadean Corwin, MD as Consulting Physician (Cardiology) Truitt Merle, MD as Consulting Physician (Hematology)   Date of Service:  03/24/2018   CHIEF COMPLAINTS: Follow-up multiple myeloma    Multiple myeloma in remission ([place])   01/31/2015 Tumor Marker    SPEP showed M-SPIKE 1.7g/dl, Cr 1.5, no anemia, hypercalcemia      03/19/2015 Imaging    Bone survey showed no discrete lytic lesion, but there is osteopenia and calvarial heterogeneity.      03/27/2015 Initial Diagnosis    Multiple myeloma, stage I      03/27/2015 Bone Marrow Biopsy    Bone Marrow, Aspirate,Biopsy: - HYPERCELLULAR BONE MARROW FOR AGE WITH PLASMA CELL NEOPLASM 47% - SEE COMMENT. PERIPHERAL BLOOD: - NO SIGNIFICANT MORPHOLOGIC ABNORMALITIES. Diagnosis Note The bone marrow shows increased number of       03/27/2015 Miscellaneous    bone marrow Cytogenetics: normal. FISH panel showed the presence of +4 and +11, this is consider standard risk of MM       04/16/2015 - 04/23/2015 Hospital Admission    He was admitted for worsening back pain, was found to have a T12 pathological fracture. He underwent kyphoplasty on 04/19/2015.      04/17/2015 Imaging    MR Thoracic and Lumbar Spine w/o Contrast 04/17/2015 IMPRESSION: MR THORACIC SPINE IMPRESSION: Pathologic compression fracture at T8 in this patient with widespread multiple myeloma. Slight retropulsion of abnormal bone without conus compression. MR LUMBAR SPINE IMPRESSION: Focal myeloma deposits at L3 and T9 without significant compression deformity. Mild stenosis at L4-5 related to ordinary spondylosis.      04/19/2015 - 08/02/2015 Chemotherapy    RVD with weekly Velcade 1.3 mg/m, dexamethasone 40 mg, and Revlimid 10 mg daily on day  1-21, every 28 days, cycle 1 Revlimid was interupted by hospitalization, cycle 3 Revlimid dose increased to 25 mg daily due to his improved renal function. total 4 cyc      04/26/2015 - 05/01/2015 Hospital Admission    He was admitted to [facility] for syncope episode during his radiation simulation. EKG showed ST elevation, troponin was positive, he underwent cardio catheterization which showed mild stenosis. No intervention was needed. echo showed EF 35-40%      04/29/2015 - 05/10/2015 Radiation Therapy    palliative RT to T12, 20Gy in 10 fractions      08/19/2015 Bone Marrow Transplant    He underwent autologous stem cell transplant at [facility].       09/30/2015 Imaging    CT anginal chest 09/30/2015 IMPRESSION: 1. No pulmonary embolism. 2. No thoracic aortic aneurysm or dissection. 3. Heart size is normal. No pericardial effusion. 4. Mild scarring/atelectasis at each lung base, probably chronic. Lungs otherwise clear. No evidence of pneumonia. No pleural effusion. 5. Evidence of diffuse multiple myeloma involvement throughout the thoracolumbar spine and multiple ribs bilaterally, with areas of present clinical relevance described below. 6. Compression fracture deformity of the T12 vertebral body, known pathologic fracture, status post treatment with vertebroplasty on 04/19/2015. 7. Milder compression deformity of the overlying T11 vertebral body, approximately 40% compressed anteriorly, also presumably pathologic. This vertebral body appeared essentially normal on fluoroscopic images from the earlier aforementioned vertebroplasty of 04/19/2015. 8. Slight irregularity of the right lateral sixth rib, with subtle cortical sclerosis suggesting nondisplaced  healing fracture. This may be a relatively acute fracture and is probably pathologic in nature related to underlying multiple myeloma. 9. Similar irregularity of the left lateral fifth rib, suggesting minimally  displaced healing fracture. This may also be a relatively acute fracture and is likely pathologic in nature related to underlying multiple myeloma. These acute or subacute rib fractures are the most likely source of patient's bilateral chest pain. Additional incidental findings in the upper abdomen: Fatty infiltration of liver, cholelithiasis without evidence of acute cholecystitis, fairly large amount of stool within the nondistended colon of the upper abdomen (constipation?).      11/27/2015 Bone Marrow Biopsy    hypocellular marrow (10%), no increased plasma cells       12/29/2015 -  Chemotherapy    maintenance Revlimid 88m daily, 3 weeks on, one week off   Held for Hospital stay and restarted after recovery      04/22/2017 - 04/26/2017 Hospital Admission    Admit date: 04/22/17-04/26/17 Admission diagnosis: Shingles Herpes Zoster Additional comments: Held Revlimid during hospital stay and healing      08/31/2017 - 09/03/2017 Hospital Admission    Admit date: 08/31/17 Admission diagnosis: Pneumonia  Additional comments: [facility] and was admitted to hospital 08/31/17 -Treated with oral Levaquin after broad-spectrum antibiotics in hospital  -Held Revlimid until next follow up*        HISTORY OF PRESENTING ILLNESS (03/15/2015):  Eric Gitelman89y.o. 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  He has been having low back pain for 3-4 months. He had food poison 4 month ago, and had frequent diarrhea. He started noticed low back pain since then. The pain is not radiating to leg, he also has intermittent right rib pain, worse with cough and sneezing. He remains to be physically active, able to do all the activities, such as gardening work housework without much limitation, but he doesn't sings slowly because of back pain.  He takes Tylenol as needed, does not like the necrotic pain medication. No night sweats, no fever or chills, no weight loss.   He was seen by his primary care physician Dr. RMerrilee Seashore MD and had lab test (see below). He also had bone density scan 2/25 which showed osteoporosis, has not been treated yet.  Current therapy:   Maintenance Revlimid 5 mg daily, 3 weeks on, one-week off, started on 12/29/2015. Revlimid Held for 4 weeks due to shingles episode, restarted week of 05/22/17. Held since 08/31/17 while hospitalized for Pneumonia and Restarted 09/17/17.   INTERIM HISTORY:  Mr. TLizakreturns for follow-up. He presents to the clinic today with a letter from his Dentist and oral surgeon. He plans to watch right jaw necrosis and he had a wart removed. He plans to see Dr. RNorma Fredricksonin 07/2018. He is tolerating Revlimid and plans to complete current cycle tomorrow. He notes he is taking B12, calcium, multivitamin. He is also taking potassium BID. He notes he gets nauseous when her takes potassium TID.    On review of symptoms, pt denies fever and infection. He notes he will occasionally get cramps in his legs. He notes stable Neuropathy with no limiting issues.       MEDICAL HISTORY:  Past Medical History:  Diagnosis Date  . Arthritis    neck and back pain  . Cholelithiases   .  Chronic kidney disease    MILD,CHRONIC  . Hyperlipidemia   . Hypertension   . Multiple myeloma ([place])   . 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 APPENDECTOMY  1962  . CARDIAC CATHETERIZATION N/A 04/30/2015   Procedure: Left Heart Cath and Coronary Angiography;  Surgeon: Peter M Martinique, MD;  Location: [facility] INVASIVE CV LAB CUPID;  Service: Cardiovascular;  Laterality: N/A;  . CARDIOLITE MYOCARDIAL PERFUSION STUDY  04/16/03   NEGITIVE BRUCE PROTOCAL EXERCISE STRESS TEST. EF 70%. NO ISCHEMIA.  [name] KNEE SURGERY  2012   right  . SHOULDER SURGERY   1999   left    SOCIAL HISTORY: History   Social History  . Marital Status: Married    Spouse Name: N/A  . Number of Children: 1   . Years of Education: N/A   Occupational History  . A retired Therapist, occupational    Social History Main Topics  . Smoking status: Former Smoker    Types: Cigars    Quit date: 12/29/2003  . Smokeless tobacco: Former Systems developer    Types: Chew    Quit date: 12/28/1994  . Alcohol Use: Yes     Comment: occasional beer  . Drug Use: No  . Sexual Activity: Not on file   Other Topics Concern  . Not on file   Social History Narrative  . No narrative on file    FAMILY HISTORY: Family History  Problem Relation Age of Onset  . Cancer Mother 60       cervical cancer   . Cancer Father        unknown cancer   . Hypertension Sister   . Cancer Sister        melanoma     ALLERGIES:  is allergic to hydrochlorothiazide; asa [aspirin]; calcium carbonate; oxycodone; amoxicillin; and indapamide.  MEDICATIONS:  Current Outpatient Medications  Medication Sig Dispense Refill  . aspirin 81 MG chewable tablet Chew 1 tablet (81 mg total) by mouth daily. (Patient taking differently: Chew 81 mg by mouth at bedtime. )    . calcium carbonate (OS-CAL) 600 MG TABS tablet Take 1,200 mg by mouth at bedtime.     . Cholecalciferol (VITAMIN D3) 1000 units CAPS Take 2,000 mg by mouth daily.    . Multiple Vitamins-Minerals (CENTRUM SILVER ULTRA MENS PO) Take 1 tablet by mouth at bedtime.     . ondansetron (ZOFRAN) 4 MG tablet TAKE 1 TABLET FOUR TIMES A DAY AS NEEDED FOR NAUSEA OR VOMITING 30 tablet 2  . potassium chloride (MICRO-K) 10 MEQ CR capsule TAKE 3 CAPSULES DAILY (Patient taking differently: Take 2 capsules daily) 90 capsule 2  . ranitidine (ZANTAC) 150 MG tablet Take 2 tablets (300 mg total) by mouth at bedtime. 60 tablet 1  . REVLIMID 5 MG capsule Take 1 capsule (5 mg total) by mouth as directed. Take 5 mg daily for 21 days, rest 7 days. 21 capsule 0  . simvastatin  (ZOCOR) 20 MG tablet Take 20 mg by mouth daily.    . valACYclovir (VALTREX) 1000 MG tablet Take 1 tablet (1,000 mg total) by mouth daily. (Patient taking differently: Take 1,000 mg by mouth every other day. ) 60 tablet 2  . zolpidem (AMBIEN) 5 MG tablet Take 1 tablet (5 mg total) by mouth at bedtime as needed for sleep. 30 tablet 0  . nitroGLYCERIN (NITROSTAT) 0.4 MG SL tablet Place 0.4 mg under the tongue every 5 (five)  minutes x 3 doses as needed for chest pain. Reported on 05/19/2016     No current facility-administered medications for this visit.     REVIEW OF SYSTEMS:   Constitutional: Denies fevers, chills or abnormal night sweats Eyes: Denies blurriness of vision, double vision or watery eyes Ears, nose, mouth, throat, and face: Denies mucositis or sore throat (+) bone exposure between last 2 right lower molars Respiratory: Denies cough, dyspnea or wheezes Cardiovascular: Denies palpitation, chest discomfort or lower extremity swelling Gastrointestinal:  Denies nausea, heartburn or change in bowel habits Skin: Negative MSK: (+) occasional muscle cramps in legs b/l Lymphatics: Denies new lymphadenopathy or easy bruising Neurological: (+) Neuropathy in hands and toes, stable.  Behavioral/Psych: Mood is stable, no new changes  Musculoskeletal: negative All other systems were reviewed with the patient and are negative.  PHYSICAL EXAMINATION:  ECOG PERFORMANCE STATUS: 0 BP 130/61 (BP Location: Right Arm, Patient Position: Sitting, Cuff Size: Normal)   Pulse (!) 52   Temp 98 F (36.7 C) (Oral)   Resp 20   Ht '5\' 9"'  (1.753 m)   Wt 159 lb 8 oz (72.3 kg)   SpO2 100%   BMI 23.55 kg/m    GENERAL: well-appearing, no distress SKIN: skin color, texture, turgor are normal, no rashes or significant lesions except to some healing rash on his left upper face, his abdomen due to shingles EYES: normal, conjunctiva are pink and non-injected, sclera clear OROPHARYNX:no exudate, no erythema and  lips, buccal mucosa, and tongue normal  (+) bone exposure between the last 2 right lower molars, non-tender, no discharge NECK: supple, thyroid normal size, non-tender, without nodularity LYMPH:  no palpable lymphadenopathy in the cervical, axillary or inguinal LUNGS: clear to auscultation and percussion with normal breathing effort HEART: regular rate & rhythm and no murmurs and no lower extremity edema ABDOMEN:abdomen soft, non-tender and normal bowel sounds Musculoskeletal:no cyanosis of digits and no clubbing, mild tenderness on bilateral lateral chest wall.  PSYCH: alert & oriented x 3 with fluent speech NEURO: no focal motor/sensory deficits CHEST WALL: slightly enlarged left breast, mild tenderness, no palpable mass. He states his left breast is slightly bigger than right for all his adult life.  LABORATORY DATA:   CBC Latest Ref Rng & Units 03/24/2018 03/03/2018 12/27/2017  WBC 4.0 - 10.3 K/uL 4.2 5.3 5.1  Hemoglobin 13.0 - 17.1 g/dL 17.2(H) 16.5 16.8  Hematocrit 38.4 - 49.9 % 51.3(H) 47.6 50.6(H)  Platelets 140 - 400 K/uL 172 178 164   CMP Latest Ref Rng & Units 03/24/2018 03/03/2018 01/17/2018  Glucose 70 - 140 mg/dL 86 92 92  BUN 7 - 26 mg/dL 6(L) 9 10  Creatinine 0.70 - 1.30 mg/dL 1.28 1.30 1.25  Sodium 136 - 145 mmol/L 138 136 140  Potassium 3.5 - 5.1 mmol/L 3.2(L) 3.3(L) 3.5  Chloride 98 - 109 mmol/L 104 108 105  CO2 22 - 29 mmol/L 26 20(L) 27  Calcium 8.4 - 10.4 mg/dL 9.5 9.3 9.6  Total Protein 6.4 - 8.3 g/dL 7.1 6.9 7.2  Total Bilirubin 0.2 - 1.2 mg/dL 0.7 0.6 0.6  Alkaline Phos 40 - 150 U/L 109 116 112  AST 5 - 34 U/L 35(H) 34 35(H)  ALT 0 - 55 U/L 52 55 55    MM lab: SPEP M-protein (g/dl) 03/15/2015: 1.6 05/24/2015: 0.5 06/21/2015: 0.4 07/18/2015 (at [facility]): 0.33 11/27/2015: not det 01/21/2016: not det  03/24/2016: not det 05/19/2016: not det  08/07/2016: not det  10/30/2017: not det 03/18/2017: Not det  06/25/17: not det.  09/17/17: not det.  12/27/17: 0.2 03/03/18:  0.1 03/24/18: PENDING   Serum IgG (mg/dl) 03/15/2015: 2000 05/24/2015: 579 06/21/2015: 673 07/18/2015 ([facility]): 509 11/27/2015: 396 01/21/2016: 487 03/24/2016: 563 05/19/2016: 600 08/07/2016: 530 10/30/2016: 589 03/18/2017: 584 06/25/17: 645 09/17/17: 542 12/27/17: 705 03/03/18: 660 03/24/18: PENDING  Serum kappa/lamda/ratio (mg/dl)  03/15/2015: 690, 0.13, 5308 05/24/2015: 146, 1.27, 115.0 06/21/2015: 86, 0.74, 116.2 01/21/2016: 1.09, 1.21, 0.9 03/24/2016: 1.72, 1.49, 1.16 05/19/2016: 1.91, 1.50, 1.27 08/07/2016: 1.36, 1.41, 0.96  10/30/2016: 1.6, 1.32, 1.21  03/18/2017: 1.67, 1.55, 1.08 06/25/17: 1.51, 1.46, 1.03 09/17/17: 1.14, 1.17, 0.97 12/27/17: 1.74, 1.90, 0.92 03/24/18: PENDING   24 h urine kappa light chain (mg) 03/18/2015: 2434 05/28/2015: 582 06/24/2015: 322 04/27/2016: 499     PATHOLOGY REPORT Bone Marrow, Aspirate,Biopsy, and Clot, right iliac 03/27/2015 - HYPERCELLULAR BONE MARROW FOR AGE WITH PLASMA CELL NEOPLASM. - SEE COMMENT. PERIPHERAL BLOOD: - NO SIGNIFICANT MORPHOLOGIC ABNORMALITIES. Diagnosis Note The bone marrow shows increased number of atypical plasma cells representing 47% of all cells in the aspirate associated with prominent interstitial infiltrates and variably sized aggregates in the clot and biopsy sections. Immunohistochemical stains show that the plasma cells are kappa light chain restricted consistent with plasma cell neoplasm. The background shows trilineage Cytogenetics: normal FISH panel: +4 and +11  Bone Marrow, Aspirate,Biopsy, and Clot, 11/27/2015 at [place]: [id number] RECEIVED: 11/27/2015 ORDERING PHYSICIAN: CESAR Melburn Hake , MD PATIENT NAME: Eric Dudley BONE MARROW REPORT Bone Marrow (BM) and Peripheral Blood (PB) FINAL PATHOLOGIC DIAGNOSIS BONE MARROW: Hypocellular marrow (10%) with no increase in plasma cells. See comment. PERIPHERAL BLOOD: Absolute lymphopenia. See CBC data. COMMENT: The touch prep is  cellular and adequate for evaluation. Rare plasma cells are present (<1%). Blasts are not increased, Myeloid and erythroid cells are present in normal proportions with intact maturation and no overt dysplastic changes. Megakaryocytes are absent. The aspirate smears are aspicular and hemodilute. Bone marrow and clot sections are variably hypocellular (0-20% range, 10% overall). Scattered interstitial plasma cells are present and comprise <5% of marrow cellularity by CD138 immunohistochemical analysis. Blasts are not increased. Myeloid and erythroid cells are present in normal proportions and have no overt morphologic abnormalities. Megakaryocytes are morphologically within normal limits. Examination of the peripheral blood reveals absolute lymphopenia. There are no circulating plasma cells or evidence of rouleaux formation.   RADIOGRAPHIC STUDIES:  Chest Xray 08/31/17 IMPRESSION: 1. Left perihilar consolidation suspicious for pneumonia in the setting of fever. Followup PA and lateral chest X-ray is recommended in 3-4 weeks following trial of antibiotic therapy to ensure resolution and exclude underlying malignancy. 2. Small well-circumscribed nodular opacity projecting over the right lung base may be a nipple shadow, pulmonary nodule is not entirely excluded. Consider including nipple markers on follow-up exam.  ASSESSMENT & PLAN:  67 y.o. Caucasian male with past medical history of hypertension, tonsil cancer status post surgery and radiation 10 years ago, now presented with 3-4 months low back pain and intermittent rib pain. His blood test showed M protein 1.7g/dl.  1. IgG multiple myeloma, stage I, standard risk by cytogenetics (+4 and +11), in remission  -I previously discussed his initial bone marrow biopsy results with him. He has significant amount of plasma cells in the bone marrow 47%.  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 Based on the new notable myeloma diagnostic criteria, he meets the criteria of multiple myeloma. He has normal albumin and mildly elevated beta 2 microglobulin, this is stage I. -His cytogenetics and Fish panel revealed standard risk. -His thoracic and lumbar spine MRI showed pathologic compression fracture at T12, focal myeloma deposits at L3 and T9, diffuse marrow signal abnormality consistent with myeloma -I previously recommended induction chemotherapy with VRD regimen, weekly Velcade and dexamethasone, first 2 cycles Revlimid dose reduced to 10 mg daily 3 week on, one-week off, based on his renal function, and it which was increased to full dose from cycle 3. He received a total of 4 cycles of treatment, achieved a very good partial response. Repeat bone marrow on 07/18/2015 showed 5% plasma cells, fish and cytogenetics was normal. -His post transplant evaluation previously showed complete remission (<1% plasma cell in marrow, undetectable M protein), and he has recovered very well. -He previously started maintenance Revlimid 5 mg daily, 3 weeks on, 1 week off, tolerating well, we'll continue. - previously Discussed possibility of Revlimid may increasing risk of secondary cancer occurring.  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  -Restarted maintenance Revlimid on 09/18/17, tolerating well -His disease is standard risk and he has been on maintenance Revlimid for over 2 years now.  We discussed the pros and cons of continue maintenance therapy  versus stopping.  His M protein has become detectable in 11/2017, possible related to a few courses of holding Revlimid due to hospitalization, and protein has came down to 0.1 lately, and we will continue monitoring.  I would favor continue maintenance therapy.  I encourage pt to discuss stopping maintenance with Dr. Norma Fredrickson at his next appointment in August 2019 -Labs reviewed, he has elevated Hg and HCT and K at 3.2. I suggest he increase water intake and watch for signs of blood clots. He will continue potassium BID and increase potassium in diet. Previous MM panel showed M-protein improved to 0.1. Today's panel is still pending.  -Labs overall adequate to continue Revlimid.  -F/u in 12 weeks      2. Disseminated Shingles in 03/2017 -He was admitted to the hospital for Shingles on 4/26-4/30 -This flared due to compromised immune system while on Revlimid -Restarted acyclovir 1012m daily for prophylaxis.  -Will follow up with PCP to review when to stop treatment -Continue on Acyclovir while on Revlimid  3. T12 pathologic compression fracture, and diffuse myeloma involvement in bones -Status post kyphoplasty and palliative radiation  -He previously received palliative Radiation, pain much improved overall. He does complain more mid back and right-sided chest pain daily, likely related to his physical activities. -continue zometa infusion every 3 months, he has been seeing his dentist regularly. -He completed Zometa 09/17/17   4. nonobstructive CAD, STEMI, Non ischemic CM, Takatsubo, with EF of 35-40% -Patient was initially found to have troponin elevation and anterior apical ST elevation MI and resultant cardiomyopathy on 04/26/15 -He previously underwent a cardiac catheterization on 5/3 which showed mild diffuse disease, no stents were placed. He is on aspirin -follow up with Dr. HDebara Pickett -He has recovered well. Repeat echo before transplant showed EF 55-60% in 11/2015  -12/13/17 ECHO showed  normal  EF at 60-65%  5. Peripheral neuropathy, grade 1  -Likely secondary to his bone marrow transplant chemotherapy -Mild and Stable overall, we'll continue observation  6. Advanced directives -his advanced directives is in EPIC -he is full code.  7. Right Jaw necrosis  -he has had some right side jaw/gum pain previously, examined by his dentist and oral surgeon, possible jaw necrosis which could be related to zometa  -he has completed 2 year zometa in 08/2017 -He has bone exposure between the last 2 right lower molars. I encouraged him to keep his mouth clean and rinse mouth regularly.  -Continue to follow up with dentist regularly.   8. Hypokalemia  -Continue oral Potassium BID and increase potassium in diet  -Mild, K 3.2 today (03/24/18)     Plan -Lab and f/u in 12 weeks  -continue maintenance Revlimid 78m, 3 weeks on and one week off     All questions were answered. The patient knows to call the clinic with any problems, questions or concerns.  I spent 20 minutes counseling the patient face to face. The total time spent in the appointment was 25 minutes and more than 50% was on counseling.     YTruitt Merle MD 03/24/2018     This document serves as a record of services personally performed by YTruitt Merle MD. It was created on her behalf by AJoslyn Devon a trained medical scribe. The creation of this record is based on the scribe's personal observations and the provider's statements to them.   I have reviewed the above documentation for accuracy and completeness, and I agree with the above.

## 2018-03-23 ENCOUNTER — Other Ambulatory Visit: Payer: Self-pay | Admitting: *Deleted

## 2018-03-23 DIAGNOSIS — C9001 Multiple myeloma in remission: Secondary | ICD-10-CM

## 2018-03-23 MED ORDER — REVLIMID 5 MG PO CAPS: 5.0000 mg | ORAL_CAPSULE | ORAL | 0 refills | Status: DC

## 2018-03-24 ENCOUNTER — Inpatient Hospital Stay: Payer: Medicare Other

## 2018-03-24 ENCOUNTER — Telehealth: Payer: Self-pay | Admitting: Hematology

## 2018-03-24 ENCOUNTER — Encounter: Payer: Self-pay | Admitting: Hematology

## 2018-03-24 ENCOUNTER — Inpatient Hospital Stay (HOSPITAL_BASED_OUTPATIENT_CLINIC_OR_DEPARTMENT_OTHER): Payer: Medicare Other | Admitting: Hematology

## 2018-03-24 VITALS — BP 130/61 | HR 52 | Temp 98.0°F | Resp 20 | Ht 69.0 in | Wt 159.5 lb

## 2018-03-24 DIAGNOSIS — C9001 Multiple myeloma in remission: Secondary | ICD-10-CM | POA: Diagnosis not present

## 2018-03-24 DIAGNOSIS — N182 Chronic kidney disease, stage 2 (mild): Secondary | ICD-10-CM

## 2018-03-24 DIAGNOSIS — G620 Drug-induced polyneuropathy: Secondary | ICD-10-CM | POA: Diagnosis not present

## 2018-03-24 DIAGNOSIS — E876 Hypokalemia: Secondary | ICD-10-CM | POA: Diagnosis not present

## 2018-03-24 DIAGNOSIS — I10 Essential (primary) hypertension: Secondary | ICD-10-CM

## 2018-03-24 DIAGNOSIS — I428 Other cardiomyopathies: Secondary | ICD-10-CM

## 2018-03-24 DIAGNOSIS — I429 Cardiomyopathy, unspecified: Secondary | ICD-10-CM | POA: Diagnosis not present

## 2018-03-24 LAB — COMPREHENSIVE METABOLIC PANEL
ALT: 52 U/L (ref 0–55)
AST: 35 U/L — AB (ref 5–34)
Albumin: 3.9 g/dL (ref 3.5–5.0)
Alkaline Phosphatase: 109 U/L (ref 40–150)
Anion gap: 8 (ref 3–11)
BUN: 6 mg/dL — AB (ref 7–26)
CHLORIDE: 104 mmol/L (ref 98–109)
CO2: 26 mmol/L (ref 22–29)
Calcium: 9.5 mg/dL (ref 8.4–10.4)
Creatinine, Ser: 1.28 mg/dL (ref 0.70–1.30)
GFR calc Af Amer: >60 mL/min (ref >60)
GFR calc non Af Amer: 56 mL/min — ABNORMAL LOW (ref >60)
Glucose, Bld: 86 mg/dL (ref 70–140)
POTASSIUM: 3.2 mmol/L — AB (ref 3.5–5.1)
SODIUM: 138 mmol/L (ref 136–145)
Total Bilirubin: 0.7 mg/dL (ref 0.2–1.2)
Total Protein: 7.1 g/dL (ref 6.4–8.3)

## 2018-03-24 LAB — CBC WITH DIFFERENTIAL/PLATELET
Basophils Absolute: 0 10*3/uL (ref 0.0–0.1)
Basophils Relative: 1 %
EOS PCT: 7 %
Eosinophils Absolute: 0.3 10*3/uL (ref 0.0–0.5)
HCT: 51.3 % — ABNORMAL HIGH (ref 38.4–49.9)
Hemoglobin: 17.2 g/dL — ABNORMAL HIGH (ref 13.0–17.1)
LYMPHS ABS: 0.7 10*3/uL — AB (ref 0.9–3.3)
LYMPHS PCT: 18 %
MCH: 36.6 pg — AB (ref 27.2–33.4)
MCHC: 33.5 g/dL (ref 32.0–36.0)
MCV: 109.2 fL — AB (ref 79.3–98.0)
MONOS PCT: 20 %
Monocytes Absolute: 0.8 10*3/uL (ref 0.1–0.9)
Neutro Abs: 2.3 10*3/uL (ref 1.5–6.5)
Neutrophils Relative %: 54 %
PLATELETS: 172 10*3/uL (ref 140–400)
RBC: 4.7 MIL/uL (ref 4.20–5.82)
RDW: 15.2 % — ABNORMAL HIGH (ref 11.0–14.6)
WBC: 4.2 10*3/uL (ref 4.0–10.3)

## 2018-03-24 NOTE — Telephone Encounter (Signed)
Scheduled appt per 3/28 los- Gave patient aVS and calender per los.

## 2018-03-25 LAB — KAPPA/LAMBDA LIGHT CHAINS
KAPPA FREE LGHT CHN: 25.4 mg/L — AB (ref 3.3–19.4)
KAPPA, LAMDA LIGHT CHAIN RATIO: 1.47 (ref 0.26–1.65)
Lambda free light chains: 17.3 mg/L (ref 5.7–26.3)

## 2018-03-27 DIAGNOSIS — G620 Drug-induced polyneuropathy: Secondary | ICD-10-CM | POA: Diagnosis not present

## 2018-03-27 DIAGNOSIS — C9001 Multiple myeloma in remission: Secondary | ICD-10-CM | POA: Diagnosis not present

## 2018-03-27 DIAGNOSIS — E876 Hypokalemia: Secondary | ICD-10-CM | POA: Diagnosis not present

## 2018-03-27 DIAGNOSIS — I429 Cardiomyopathy, unspecified: Secondary | ICD-10-CM | POA: Diagnosis not present

## 2018-03-28 ENCOUNTER — Other Ambulatory Visit: Payer: Self-pay | Admitting: *Deleted

## 2018-03-28 DIAGNOSIS — C9001 Multiple myeloma in remission: Secondary | ICD-10-CM

## 2018-03-28 LAB — MULTIPLE MYELOMA PANEL, SERUM
ALBUMIN SERPL ELPH-MCNC: 3.7 g/dL (ref 2.9–4.4)
Albumin/Glob SerPl: 1.2 (ref 0.7–1.7)
Alpha 1: 0.2 g/dL (ref 0.0–0.4)
Alpha2 Glob SerPl Elph-Mcnc: 1.1 g/dL — ABNORMAL HIGH (ref 0.4–1.0)
B-Globulin SerPl Elph-Mcnc: 1.1 g/dL (ref 0.7–1.3)
Gamma Glob SerPl Elph-Mcnc: 0.7 g/dL (ref 0.4–1.8)
Globulin, Total: 3.1 g/dL (ref 2.2–3.9)
IGA: 14 mg/dL — AB (ref 61–437)
IgG (Immunoglobin G), Serum: 774 mg/dL (ref 700–1600)
IgM (Immunoglobulin M), Srm: 36 mg/dL (ref 20–172)
TOTAL PROTEIN ELP: 6.8 g/dL (ref 6.0–8.5)

## 2018-03-29 ENCOUNTER — Telehealth: Payer: Self-pay | Admitting: *Deleted

## 2018-03-29 LAB — UPEP/UIFE/LIGHT CHAINS/TP, 24-HR UR
% BETA, Urine: 22.6 %
ALBUMIN, U: 35.9 %
ALPHA 1 URINE: 5 %
Alpha 2, Urine: 23 %
Free Kappa Lt Chains,Ur: 231 mg/L — ABNORMAL HIGH (ref 1.35–24.19)
Free Kappa/Lambda Ratio: 6.83 (ref 2.04–10.37)
Free Lambda Lt Chains,Ur: 33.8 mg/L — ABNORMAL HIGH (ref 0.24–6.66)
GAMMA GLOBULIN URINE: 13.5 %
TOTAL VOLUME: 2550
Total Protein, Urine-Ur/day: 974 mg/24 hr — ABNORMAL HIGH (ref 30–150)
Total Protein, Urine: 38.2 mg/dL

## 2018-03-29 NOTE — Telephone Encounter (Signed)
-----   Message from Truitt Merle, MD sent at 03/28/2018 11:15 PM EDT ----- Please let pt know the lab result, M-protein has become negative this time, which is a good news. Thanks.  Truitt Merle  03/28/2018

## 2018-03-29 NOTE — Telephone Encounter (Signed)
Spoke with pt and informed pt of M-protein has become negative this time as per Dr. Ernestina Penna instructions.  Pt voiced understanding.

## 2018-04-01 ENCOUNTER — Other Ambulatory Visit: Payer: Self-pay | Admitting: Hematology

## 2018-04-01 DIAGNOSIS — C9001 Multiple myeloma in remission: Secondary | ICD-10-CM

## 2018-04-25 ENCOUNTER — Other Ambulatory Visit: Payer: Self-pay

## 2018-04-25 DIAGNOSIS — C9001 Multiple myeloma in remission: Secondary | ICD-10-CM

## 2018-04-25 MED ORDER — REVLIMID 5 MG PO CAPS: 5.0000 mg | ORAL_CAPSULE | ORAL | 0 refills | Status: DC

## 2018-04-27 ENCOUNTER — Other Ambulatory Visit: Payer: Self-pay | Admitting: *Deleted

## 2018-05-20 ENCOUNTER — Other Ambulatory Visit: Payer: Self-pay

## 2018-05-20 ENCOUNTER — Telehealth: Payer: Self-pay

## 2018-05-20 DIAGNOSIS — C9001 Multiple myeloma in remission: Secondary | ICD-10-CM

## 2018-05-20 MED ORDER — REVLIMID 5 MG PO CAPS: 5.0000 mg | ORAL_CAPSULE | ORAL | 0 refills | Status: DC

## 2018-05-20 NOTE — Telephone Encounter (Signed)
Faxed new script for Revlimid to [place] with [employer] authorization.

## 2018-06-09 DIAGNOSIS — J0100 Acute maxillary sinusitis, unspecified: Secondary | ICD-10-CM | POA: Diagnosis not present

## 2018-06-09 DIAGNOSIS — J329 Chronic sinusitis, unspecified: Secondary | ICD-10-CM | POA: Diagnosis not present

## 2018-06-15 ENCOUNTER — Other Ambulatory Visit: Payer: Self-pay

## 2018-06-15 ENCOUNTER — Telehealth: Payer: Self-pay

## 2018-06-15 DIAGNOSIS — C9001 Multiple myeloma in remission: Secondary | ICD-10-CM

## 2018-06-15 MED ORDER — REVLIMID 5 MG PO CAPS: 5.0000 mg | ORAL_CAPSULE | ORAL | 0 refills | Status: DC

## 2018-06-15 NOTE — Telephone Encounter (Signed)
Faxed script for Revlimid to [place] along with [employer] authorization.

## 2018-06-15 NOTE — Progress Notes (Signed)
[place]  [phone] [phone]  Clinic Follow Up Note   Patient Care Team: [name], MD as PCP - General (Internal Medicine) Debara Pickett Nadean Corwin, MD as PCP - Cardiology (Cardiology) Debara Pickett Nadean Corwin, MD as Consulting Physician (Cardiology) Truitt Merle, MD as Consulting Physician (Hematology)   Date of Service:  06/16/2018   CHIEF COMPLAINTS: Follow-up multiple myeloma    Multiple myeloma in remission ([place])   01/31/2015 Tumor Marker    SPEP showed M-SPIKE 1.7g/dl, Cr 1.5, no anemia, hypercalcemia      03/19/2015 Imaging    Bone survey showed no discrete lytic lesion, but there is osteopenia and calvarial heterogeneity.      03/27/2015 Initial Diagnosis    Multiple myeloma, stage I      03/27/2015 Bone Marrow Biopsy    Bone Marrow, Aspirate,Biopsy: - HYPERCELLULAR BONE MARROW FOR AGE WITH PLASMA CELL NEOPLASM 47% - SEE COMMENT. PERIPHERAL BLOOD: - NO SIGNIFICANT MORPHOLOGIC ABNORMALITIES. Diagnosis Note The bone marrow shows increased number of       03/27/2015 Miscellaneous    bone marrow Cytogenetics: normal. FISH panel showed the presence of +4 and +11, this is consider standard risk of MM       04/16/2015 - 04/23/2015 Hospital Admission    He was admitted for worsening back pain, was found to have a T12 pathological fracture. He underwent kyphoplasty on 04/19/2015.      04/17/2015 Imaging    MR Thoracic and Lumbar Spine w/o Contrast 04/17/2015 IMPRESSION: MR THORACIC SPINE IMPRESSION: Pathologic compression fracture at T8 in this patient with widespread multiple myeloma. Slight retropulsion of abnormal bone without conus compression. MR LUMBAR SPINE IMPRESSION: Focal myeloma deposits at L3 and T9 without significant compression deformity. Mild stenosis at L4-5 related to ordinary spondylosis.      04/19/2015 - 08/02/2015 Chemotherapy    RVD with weekly Velcade 1.3 mg/m, dexamethasone 40 mg, and Revlimid 10 mg daily on day  1-21, every 28 days, cycle 1 Revlimid was interupted by hospitalization, cycle 3 Revlimid dose increased to 25 mg daily due to his improved renal function. total 4 cyc      04/26/2015 - 05/01/2015 Hospital Admission    He was admitted to [facility] for syncope episode during his radiation simulation. EKG showed ST elevation, troponin was positive, he underwent cardio catheterization which showed mild stenosis. No intervention was needed. echo showed EF 35-40%      04/29/2015 - 05/10/2015 Radiation Therapy    palliative RT to T12, 20Gy in 10 fractions      08/19/2015 Bone Marrow Transplant    He underwent autologous stem cell transplant at [facility].       09/30/2015 Imaging    CT anginal chest 09/30/2015 IMPRESSION: 1. No pulmonary embolism. 2. No thoracic aortic aneurysm or dissection. 3. Heart size is normal. No pericardial effusion. 4. Mild scarring/atelectasis at each lung base, probably chronic. Lungs otherwise clear. No evidence of pneumonia. No pleural effusion. 5. Evidence of diffuse multiple myeloma involvement throughout the thoracolumbar spine and multiple ribs bilaterally, with areas of present clinical relevance described below. 6. Compression fracture deformity of the T12 vertebral body, known pathologic fracture, status post treatment with vertebroplasty on 04/19/2015. 7. Milder compression deformity of the overlying T11 vertebral body, approximately 40% compressed anteriorly, also presumably pathologic. This vertebral body appeared essentially normal on fluoroscopic images from the earlier aforementioned vertebroplasty of 04/19/2015. 8. Slight irregularity of the right lateral sixth rib, with subtle cortical sclerosis suggesting nondisplaced  healing fracture. This may be a relatively acute fracture and is probably pathologic in nature related to underlying multiple myeloma. 9. Similar irregularity of the left lateral fifth rib, suggesting minimally  displaced healing fracture. This may also be a relatively acute fracture and is likely pathologic in nature related to underlying multiple myeloma. These acute or subacute rib fractures are the most likely source of patient's bilateral chest pain. Additional incidental findings in the upper abdomen: Fatty infiltration of liver, cholelithiasis without evidence of acute cholecystitis, fairly large amount of stool within the nondistended colon of the upper abdomen (constipation?).      11/27/2015 Bone Marrow Biopsy    hypocellular marrow (10%), no increased plasma cells       12/29/2015 -  Chemotherapy    maintenance Revlimid 2m daily, 3 weeks on, one week off   Held for Hospital stay and restarted after recovery      04/22/2017 - 04/26/2017 Hospital Admission    Admit date: 04/22/17-04/26/17 Admission diagnosis: Shingles Herpes Zoster Additional comments: Held Revlimid during hospital stay and healing      08/31/2017 - 09/03/2017 Hospital Admission    Admit date: 08/31/17 Admission diagnosis: Pneumonia  Additional comments: [facility] and was admitted to hospital 08/31/17 -Treated with oral Levaquin after broad-spectrum antibiotics in hospital  -Held Revlimid until next follow up*        HISTORY OF PRESENTING ILLNESS (03/15/2015):  [name]89y.o. 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  He has been having low back pain for 3-4 months. He had food poison 4 month ago, and had frequent diarrhea. He started noticed low back pain since then. The pain is not radiating to leg, he also has intermittent right rib pain, worse with cough and sneezing. He remains to be physically active, able to do all the activities, such as gardening work housework without much limitation, but he doesn't sings slowly because of back pain.  He takes Tylenol as needed, does not like the necrotic pain medication. No night sweats, no fever or chills, no weight loss.   He was seen by his primary care physician Dr. RMerrilee Seashore MD and had lab test (see below). He also had bone density scan 2/25 which showed osteoporosis, has not been treated yet.  Current therapy:   Maintenance Revlimid 5 mg daily, 3 weeks on, one-week off, started on 12/29/2015. Revlimid Held for 4 weeks due to shingles episode, restarted week of 05/22/17. Held since 08/31/17 while hospitalized for Pneumonia and Restarted 09/18/17.   INTERIM HISTORY:  Mr. TStithreturns for follow-up. He presents to the clinic today by himself. He notes he has no energy this week. He notes he went hunting earlier this week and continues to be active. His last day on Revlimid is tomorrow (06/17/18).  He notes previously he was sick with sinus infection and treated with Zpack which resolved this. He notes he drinks plenty of water daily. He takes B12, Calcium, D3 and a multivitamin. He denies any use of testosterone. [facility] and follows up every year. Pt notes his bone exposure is improving, which is not causing pain but has intermittent numbness. He is still taking calcium.    On review of symptoms, pt notes chronic back pain but no new pains. He  notes no energy and fatigue.  He denies having jaw pain.       MEDICAL HISTORY:  Past Medical History:  Diagnosis Date  . Arthritis    neck and back pain  . Cholelithiases   . Chronic kidney disease    MILD,CHRONIC  . Hyperlipidemia   . Hypertension   . Multiple myeloma ([place])   . 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 APPENDECTOMY  1962  . CARDIAC CATHETERIZATION N/A 04/30/2015   Procedure: Left Heart Cath and Coronary Angiography;  Surgeon: Peter M Martinique, MD;  Location: [facility] INVASIVE CV LAB CUPID;  Service: Cardiovascular;   Laterality: N/A;  . CARDIOLITE MYOCARDIAL PERFUSION STUDY  04/16/03   NEGITIVE BRUCE PROTOCAL EXERCISE STRESS TEST. EF 70%. NO ISCHEMIA.  [name] KNEE SURGERY  2012   right  . SHOULDER SURGERY  1999   left    SOCIAL HISTORY: History   Social History  . Marital Status: Married    Spouse Name: N/A  . Number of Children: 1   . Years of Education: N/A   Occupational History  . A retired Therapist, occupational    Social History Main Topics  . Smoking status: Former Smoker    Types: Cigars    Quit date: 12/29/2003  . Smokeless tobacco: Former Systems developer    Types: Chew    Quit date: 12/28/1994  . Alcohol Use: Yes     Comment: occasional beer  . Drug Use: No  . Sexual Activity: Not on file   Other Topics Concern  . Not on file   Social History Narrative  . No narrative on file    FAMILY HISTORY: Family History  Problem Relation Age of Onset  . Cancer Mother 56       cervical cancer   . Cancer Father        unknown cancer   . Hypertension Sister   . Cancer Sister        melanoma     ALLERGIES:  is allergic to hydrochlorothiazide; asa [aspirin]; calcium carbonate; oxycodone; amoxicillin; and indapamide.  MEDICATIONS:  Current Outpatient Medications  Medication Sig Dispense Refill  . aspirin 81 MG chewable tablet Chew 1 tablet (81 mg total) by mouth daily. (Patient taking differently: Chew 81 mg by mouth at bedtime. )    . calcium carbonate (OS-CAL) 600 MG TABS tablet Take 1,200 mg by mouth at bedtime.     . Cholecalciferol (VITAMIN D3) 1000 units CAPS Take 2,000 mg by mouth daily.    . Multiple Vitamins-Minerals (CENTRUM SILVER ULTRA MENS PO) Take 1 tablet by mouth at bedtime.     . nitroGLYCERIN (NITROSTAT) 0.4 MG SL tablet Place 0.4 mg under the tongue every 5 (five) minutes x 3 doses as needed for chest pain. Reported on 05/19/2016    . ondansetron (ZOFRAN) 4 MG tablet TAKE 1 TABLET FOUR TIMES A DAY AS NEEDED FOR NAUSEA OR VOMITING 30 tablet 2  . potassium chloride (MICRO-K) 10  MEQ CR capsule TAKE 3 CAPSULES DAILY (Patient taking differently: Take 2 capsules daily) 90 capsule 2  . ranitidine (ZANTAC) 150 MG tablet Take 2 tablets (300 mg total) by mouth at bedtime. 60 tablet 1  . REVLIMID 5 MG capsule Take 1 capsule (5 mg total) by mouth as directed. Take 5 mg daily for 21 days, rest 7 days. 21 capsule 0  . simvastatin (ZOCOR) 20 MG tablet Take 20 mg by mouth daily.    [name]  valACYclovir (VALTREX) 1000 MG tablet Take 1 tablet (1,000 mg total) by mouth daily. (Patient taking differently: Take 1,000 mg by mouth every other day. ) 60 tablet 2  . zolpidem (AMBIEN) 5 MG tablet Take 1 tablet (5 mg total) by mouth at bedtime as needed for sleep. 30 tablet 0   No current facility-administered medications for this visit.     REVIEW OF SYSTEMS:   Constitutional: Denies fevers, chills or abnormal night sweats Eyes: Denies blurriness of vision, double vision or watery eyes Ears, nose, mouth, throat, and face: Denies mucositis or sore throat (+) bone exposure between last 2 right lower molars Respiratory: Denies cough, dyspnea or wheezes Cardiovascular: Denies palpitation, chest discomfort or lower extremity swelling Gastrointestinal:  Denies nausea, heartburn or change in bowel habits Skin: Negative MSK: (+) chronic back pain, stable Lymphatics: Denies new lymphadenopathy or easy bruising Neurological: negative Behavioral/Psych: Mood is stable, no new changes  Musculoskeletal: negative All other systems were reviewed with the patient and are negative.  PHYSICAL EXAMINATION:  ECOG PERFORMANCE STATUS: 1 BP 126/63 (BP Location: Left Arm, Patient Position: Sitting)   Pulse 63   Temp 97.6 F (36.4 C) (Oral)   Resp 18   Ht _0  (1.753 m)   Wt 150 lb 11.2 oz (68.4 kg)   SpO2 100%   BMI 22.25 kg/m    GENERAL: well-appearing, no distress SKIN: skin color, texture, turgor are normal, no rashes or significant lesions except to some healing rash on his left upper face, his  abdomen due to shingles EYES: normal, conjunctiva are pink and non-injected, sclera clear OROPHARYNX:no exudate, no erythema and lips, buccal mucosa, and tongue normal  (+) bone exposure between the last 2 right lower molars, non-tender, no discharge NECK: supple, thyroid normal size, non-tender, without nodularity LYMPH:  no palpable lymphadenopathy in the cervical, axillary or inguinal LUNGS: clear to auscultation and percussion with normal breathing effort HEART: regular rate & rhythm and no murmurs and no lower extremity edema ABDOMEN:abdomen soft, non-tender and normal bowel sounds Musculoskeletal:no cyanosis of digits and no clubbing, mild tenderness on bilateral lateral chest wall.  PSYCH: alert & oriented x 3 with fluent speech NEURO: no focal motor/sensory deficits CHEST WALL: slightly enlarged left breast, mild tenderness, no palpable mass. He states his left breast is slightly bigger than right for all his adult life.  LABORATORY DATA:   CBC Latest Ref Rng & Units 06/16/2018 03/24/2018 03/03/2018  WBC 4.0 - 10.3 K/uL 6.5 4.2 5.3  Hemoglobin 13.0 - 17.1 g/dL 18.0(H) 17.2(H) 16.5  Hematocrit 38.4 - 49.9 % 51.4(H) 51.3(H) 47.6  Platelets 140 - 400 K/uL 188 172 178   CMP Latest Ref Rng & Units 06/16/2018 03/24/2018 03/03/2018  Glucose 70 - 140 mg/dL 126 86 92  BUN 7 - 26 mg/dL 9 6(L) 9  Creatinine 0.70 - 1.30 mg/dL 1.48(H) 1.28 1.30  Sodium 136 - 145 mmol/L 136 138 136  Potassium 3.5 - 5.1 mmol/L 2.8(LL) 3.2(L) 3.3(L)  Chloride 98 - 109 mmol/L 101 104 108  CO2 22 - 29 mmol/L 26 26 20(L)  Calcium 8.4 - 10.4 mg/dL 9.6 9.5 9.3  Total Protein 6.4 - 8.3 g/dL 6.6 7.1 6.9  Total Bilirubin 0.2 - 1.2 mg/dL 0.6 0.7 0.6  Alkaline Phos 40 - 150 U/L 116 109 116  AST 5 - 34 U/L 31 35(H) 34  ALT 0 - 55 U/L 64(H) 52 55    MM lab:  SPEP M-protein (g/dl) 03/15/2015: 1.6 05/24/2015: 0.5 06/21/2015: 0.4 07/18/2015 (  at [facility]): 0.33 11/27/2015: not det 01/21/2016: not det  03/24/2016: not  det 05/19/2016: not det  08/07/2016: not det  10/30/2017: not det 03/18/2017: Not det 06/25/17: not det.  09/17/17: not det.  12/27/17: 0.2 03/03/18: 0.1 03/24/18: Not Observed 06/16/18: PENDING    Serum IgG (mg/dl) 03/15/2015: 2000 05/24/2015: 579 06/21/2015: 673 07/18/2015 ([facility]): 509 11/27/2015: 396 01/21/2016: 487 03/24/2016: 563 05/19/2016: 600 08/07/2016: 530 10/30/2016: 589 03/18/2017: 584 06/25/17: 645 09/17/17: 542 12/27/17: 705 03/03/18: 660 03/24/18: 774 06/16/18: PENDING   Serum kappa/lamda/ratio (mg/dl)  03/15/2015: 690, 0.13, 5308 05/24/2015: 146, 1.27, 115.0 06/21/2015: 86, 0.74, 116.2 01/21/2016: 1.09, 1.21, 0.9 03/24/2016: 1.72, 1.49, 1.16 05/19/2016: 1.91, 1.50, 1.27 08/07/2016: 1.36, 1.41, 0.96  10/30/2016: 1.6, 1.32, 1.21  03/18/2017: 1.67, 1.55, 1.08 06/25/17: 1.51, 1.46, 1.03 09/17/17: 1.14, 1.17, 0.97 12/27/17: 1.74, 1.90, 0.92 03/24/18: 2.54, 1.73, 1.47 06/16/18: PENDING    24 h urine kappa light chain (mg) 03/18/2015: 2434 05/28/2015: 582 06/24/2015: 322 04/27/2016: 499     PATHOLOGY REPORT Bone Marrow, Aspirate,Biopsy, and Clot, right iliac 03/27/2015 - HYPERCELLULAR BONE MARROW FOR AGE WITH PLASMA CELL NEOPLASM. - SEE COMMENT. PERIPHERAL BLOOD: - NO SIGNIFICANT MORPHOLOGIC ABNORMALITIES. Diagnosis Note The bone marrow shows increased number of atypical plasma cells representing 47% of all cells in the aspirate associated with prominent interstitial infiltrates and variably sized aggregates in the clot and biopsy sections. Immunohistochemical stains show that the plasma cells are kappa light chain restricted consistent with plasma cell neoplasm. The background shows trilineage Cytogenetics: normal FISH panel: +4 and +11  Bone Marrow, Aspirate,Biopsy, and Clot, 11/27/2015 at [place]: [id number] RECEIVED: 11/27/2015 ORDERING PHYSICIAN: CESAR Melburn Hake , MD PATIENT NAME: Gerrit Heck BONE MARROW REPORT Bone Marrow (BM) and Peripheral  Blood (PB) FINAL PATHOLOGIC DIAGNOSIS BONE MARROW: Hypocellular marrow (10%) with no increase in plasma cells. See comment. PERIPHERAL BLOOD: Absolute lymphopenia. See CBC data. COMMENT: The touch prep is cellular and adequate for evaluation. Rare plasma cells are present (<1%). Blasts are not increased, Myeloid and erythroid cells are present in normal proportions with intact maturation and no overt dysplastic changes. Megakaryocytes are absent. The aspirate smears are aspicular and hemodilute. Bone marrow and clot sections are variably hypocellular (0-20% range, 10% overall). Scattered interstitial plasma cells are present and comprise <5% of marrow cellularity by CD138 immunohistochemical analysis. Blasts are not increased. Myeloid and erythroid cells are present in normal proportions and have no overt morphologic abnormalities. Megakaryocytes are morphologically within normal limits. Examination of the peripheral blood reveals absolute lymphopenia. There are no circulating plasma cells or evidence of rouleaux formation.   RADIOGRAPHIC STUDIES:  Chest Xray 08/31/17 IMPRESSION: 1. Left perihilar consolidation suspicious for pneumonia in the setting of fever. Followup PA and lateral chest X-ray is recommended in 3-4 weeks following trial of antibiotic therapy to ensure resolution and exclude underlying malignancy. 2. Small well-circumscribed nodular opacity projecting over the right lung base may be a nipple shadow, pulmonary nodule is not entirely excluded. Consider including nipple markers on follow-up exam.  ASSESSMENT & PLAN:  67 y.o. Caucasian male with past medical history of hypertension, tonsil cancer status post surgery and radiation 10 years ago, now presented with 3-4 months low back pain and intermittent rib pain. His blood test showed M protein 1.7g/dl.  1. IgG multiple myeloma, stage I, standard risk by cytogenetics (+4 and +11), in remission  -I previously  discussed his initial bone marrow biopsy results with him. He has significant amount of plasma cells in the bone marrow 47%.  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 Based on the new notable myeloma diagnostic criteria, he meets the criteria of multiple myeloma. He has normal albumin and mildly elevated beta 2 microglobulin, this is stage I. -His cytogenetics and Fish panel revealed standard risk. -His thoracic and lumbar spine MRI showed pathologic compression fracture at T12, focal myeloma deposits at L3 and T9, diffuse marrow signal abnormality consistent with myeloma -I previously recommended induction chemotherapy with VRD regimen: weekly Velcade and dexamethasone, first 2 cycles Revlimid dose reduced to 10 mg daily 3 week on, one-week off, based on his renal function, and it which was increased to full dose from cycle 3. He received a total of 4 cycles of treatment, achieved a very good partial response. Repeat bone marrow on 07/18/2015 showed 5% plasma cells, fish and cytogenetics was normal. -His post transplant evaluation previously showed complete remission (<1% plasma cell in marrow, undetectable M protein), and he has recovered very well. -He previously started maintenance Revlimid 5 mg daily, 3 weeks on, 1 week off, tolerating well. - previously Discussed possibility of Revlimid may increasing risk of secondary cancer occurring.  -Plan to have colonoscopy in 2020, he does PSA screening at [facility]  -He has completed Zometa every 3 months after 2 years in 08/2017.  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  -His disease is standard risk. We previously discussed the pros and cons of continue maintenance therapy versus stopping. His M protein became detectable in 11/2017, possible related to a few courses of holding Revlimid due to hospitalization, as it started to trend down to 0.1. I would favor continue maintenance therapy. I encouraged pt to discuss stopping maintenance with Dr. Norma Fredrickson at his next appointment in August 2019 -Labs reviewed, his M protein not detectable on previously lab and today's hg has increased to 18.0. I discussed with elevation of hg this can put him at risk for developing a blood clot. I will check his Jak2 gene mutation to rule out Polycythemia vera.  -I recommend he use Vitamin C and continue Baby aspirin to reduce risk of blood clots.  -Labs overall adequate to continue Revlimid.  -F/u in 4 months   2. Disseminated Shingles in 03/2017 -He was admitted to the hospital for Shingles in 03/2017. This flared due to compromised immune system while on Revlimid -He previously restarted acyclovir 1042m daily for prophylaxis. He will continue on Acyclovir while on Revlimid -Will follow up with PCP to review when to stop treatment  3. T12 pathologic compression fracture, and diffuse myeloma involvement in bones -Status post kyphoplasty and palliative radiation  -He previously received palliative Radiation, pain much improved overall. He does complain more mid back and right-sided chest pain daily, likely related to his physical activities. -continue zometa infusion every 3 months, he has been seeing his dentist regularly. -He completed Zometa 09/17/17   4. nonobstructive CAD, STEMI, Non ischemic CM, Takatsubo, with EF of 35-40% -Patient was initially found to have troponin elevation and anterior apical ST elevation, MI and resultant cardiomyopathy on 04/26/15 -He previously underwent a cardiac catheterization on 04/30/15 which showed mild diffuse disease, no stents were  placed. He  recovered well.  -He is on Aspirin.  -12/13/17 ECHO showed normal EF at 60-65% -Continue to follow up with Dr. Debara Pickett   5. Peripheral neuropathy, grade 1  -Likely secondary to his bone marrow transplant and chemotherapy -Not mentioned in today's visit, likely much improved. we'll continue observation   6.  Polycythemia -His routine lab has showed increased hemoglobin and hematocrit lately, he quit smoking in 2005, does not have sleep apnea, does not use testosterone injection.  No obvious secondary cause for polycythemia -I will check a Jak2 V617F and exon 12 mutation today   7. Right Jaw necrosis  -He previously had some right side jaw/gum pain, examined by his dentist and oral surgeon, possible jaw necrosis which could be related to zometa  -he has completed 2 year zometa in 08/2017 -He has bone exposure between the last 2 right lower molars. I previously encouraged him to keep his mouth clean and rinse mouth regularly.  -Bone exposure is improving with intermittent numbness, he will continue to follow up with PCP. Continue oral calcium.   8. Hypokalemia  -Continue oral Potassium BID and increase potassium in diet  -K down to 2.8 today (06/16/18), I increased his oral potassium to 42mq daily for 2 weeks then back to 362m daily       Plan -Lab today for JAK2  -Lab and f/u in 4 months, pt will see Dr. RoDebbrah Alarn 2 months -continue maintenance Revlimid [id number] weeks on and one week off, next cycle starts on 6/29 -He will increase his oral KCL supplement     All questions were answered. The patient knows to call the clinic with any problems, questions or concerns.  I spent 20 minutes counseling the patient face to face. The total time spent in the appointment was 25 minutes and more than 50% was on counseling.     YanTruitt MerleD 06/16/2018     I, AmoJoslyn Devonm acting as scribe for YanTruitt MerleD.   I have reviewed the above documentation for accuracy and  completeness, and I agree with the above.

## 2018-06-16 ENCOUNTER — Inpatient Hospital Stay: Payer: Medicare Other | Attending: Hematology | Admitting: Hematology

## 2018-06-16 ENCOUNTER — Encounter: Payer: Self-pay | Admitting: Hematology

## 2018-06-16 ENCOUNTER — Inpatient Hospital Stay: Payer: Medicare Other

## 2018-06-16 ENCOUNTER — Telehealth: Payer: Self-pay

## 2018-06-16 VITALS — BP 126/63 | HR 63 | Temp 97.6°F | Resp 18 | Ht 69.0 in | Wt 150.7 lb

## 2018-06-16 DIAGNOSIS — G620 Drug-induced polyneuropathy: Secondary | ICD-10-CM | POA: Diagnosis not present

## 2018-06-16 DIAGNOSIS — E876 Hypokalemia: Secondary | ICD-10-CM | POA: Insufficient documentation

## 2018-06-16 DIAGNOSIS — G8929 Other chronic pain: Secondary | ICD-10-CM | POA: Insufficient documentation

## 2018-06-16 DIAGNOSIS — D45 Polycythemia vera: Secondary | ICD-10-CM

## 2018-06-16 DIAGNOSIS — Z87891 Personal history of nicotine dependence: Secondary | ICD-10-CM | POA: Insufficient documentation

## 2018-06-16 DIAGNOSIS — Z7982 Long term (current) use of aspirin: Secondary | ICD-10-CM | POA: Diagnosis not present

## 2018-06-16 DIAGNOSIS — M545 Low back pain: Secondary | ICD-10-CM | POA: Insufficient documentation

## 2018-06-16 DIAGNOSIS — D751 Secondary polycythemia: Secondary | ICD-10-CM | POA: Insufficient documentation

## 2018-06-16 DIAGNOSIS — C9001 Multiple myeloma in remission: Secondary | ICD-10-CM

## 2018-06-16 LAB — CBC WITH DIFFERENTIAL/PLATELET
Basophils Absolute: 0 10*3/uL (ref 0.0–0.1)
Basophils Relative: 0 %
EOS ABS: 0.3 10*3/uL (ref 0.0–0.5)
EOS PCT: 4 %
HCT: 51.4 % — ABNORMAL HIGH (ref 38.4–49.9)
HEMOGLOBIN: 18 g/dL — AB (ref 13.0–17.1)
Lymphocytes Relative: 14 %
Lymphs Abs: 0.9 10*3/uL (ref 0.9–3.3)
MCH: 36.4 pg — ABNORMAL HIGH (ref 27.2–33.4)
MCHC: 35 g/dL (ref 32.0–36.0)
MCV: 103.8 fL — ABNORMAL HIGH (ref 79.3–98.0)
Monocytes Absolute: 1.4 10*3/uL — ABNORMAL HIGH (ref 0.1–0.9)
Monocytes Relative: 22 %
NEUTROS PCT: 60 %
Neutro Abs: 3.9 10*3/uL (ref 1.5–6.5)
PLATELETS: 188 10*3/uL (ref 140–400)
RBC: 4.95 MIL/uL (ref 4.20–5.82)
RDW: 14.8 % — ABNORMAL HIGH (ref 11.0–14.6)
WBC: 6.5 10*3/uL (ref 4.0–10.3)

## 2018-06-16 LAB — COMPREHENSIVE METABOLIC PANEL
ALT: 64 U/L — AB (ref 0–55)
AST: 31 U/L (ref 5–34)
Albumin: 3.7 g/dL (ref 3.5–5.0)
Alkaline Phosphatase: 116 U/L (ref 40–150)
Anion gap: 9 (ref 3–11)
BILIRUBIN TOTAL: 0.6 mg/dL (ref 0.2–1.2)
BUN: 9 mg/dL (ref 7–26)
CO2: 26 mmol/L (ref 22–29)
CREATININE: 1.48 mg/dL — AB (ref 0.70–1.30)
Calcium: 9.6 mg/dL (ref 8.4–10.4)
Chloride: 101 mmol/L (ref 98–109)
GFR, EST AFRICAN AMERICAN: 54 mL/min — AB (ref >60)
GFR, EST NON AFRICAN AMERICAN: 47 mL/min — AB (ref >60)
Glucose, Bld: 126 mg/dL (ref 70–140)
POTASSIUM: 2.8 mmol/L — AB (ref 3.5–5.1)
Sodium: 136 mmol/L (ref 136–145)
TOTAL PROTEIN: 6.6 g/dL (ref 6.4–8.3)

## 2018-06-16 NOTE — Progress Notes (Signed)
Notified Dr. Burr Medico Potassium today was 2.8.  Per Dr. Burr Medico instructed patient to double up on his Potassium taking 20 meq tid x 1 week. (spoke to him in scheduling lobby) He verbalized an understanding.

## 2018-06-16 NOTE — Telephone Encounter (Signed)
Printed avs and calender of upcoming appointment. Per 6/20 los 

## 2018-06-17 DIAGNOSIS — B078 Other viral warts: Secondary | ICD-10-CM | POA: Diagnosis not present

## 2018-06-17 LAB — MULTIPLE MYELOMA PANEL, SERUM
ALBUMIN/GLOB SERPL: 1.5 (ref 0.7–1.7)
ALPHA 1: 0.2 g/dL (ref 0.0–0.4)
Albumin SerPl Elph-Mcnc: 3.5 g/dL (ref 2.9–4.4)
Alpha2 Glob SerPl Elph-Mcnc: 0.9 g/dL (ref 0.4–1.0)
B-Globulin SerPl Elph-Mcnc: 0.8 g/dL (ref 0.7–1.3)
Gamma Glob SerPl Elph-Mcnc: 0.5 g/dL (ref 0.4–1.8)
Globulin, Total: 2.5 g/dL (ref 2.2–3.9)
IGA: 8 mg/dL — AB (ref 61–437)
IgG (Immunoglobin G), Serum: 620 mg/dL — ABNORMAL LOW (ref 700–1600)
IgM (Immunoglobulin M), Srm: 14 mg/dL — ABNORMAL LOW (ref 20–172)
Total Protein ELP: 6 g/dL (ref 6.0–8.5)

## 2018-06-17 LAB — KAPPA/LAMBDA LIGHT CHAINS
KAPPA, LAMDA LIGHT CHAIN RATIO: 1.42 (ref 0.26–1.65)
Kappa free light chain: 15.3 mg/L (ref 3.3–19.4)
Lambda free light chains: 10.8 mg/L (ref 5.7–26.3)

## 2018-06-20 ENCOUNTER — Telehealth: Payer: Self-pay

## 2018-06-20 NOTE — Telephone Encounter (Signed)
Per Dr. Burr Medico spoke to patient regarding lab results, M-protein negative, light chain levels are normal with no concerns.  Patient verbalized an undestanding.

## 2018-06-20 NOTE — Telephone Encounter (Signed)
-----   Message from Truitt Merle, MD sent at 06/20/2018  7:53 AM EDT ----- Please let pt know the lab result. M-protein negative, light chain levels are normal, no concerns, thanks   Truitt Merle  06/20/2018

## 2018-07-01 DIAGNOSIS — S30865A Insect bite (nonvenomous) of unspecified external genital organs, male, initial encounter: Secondary | ICD-10-CM | POA: Diagnosis not present

## 2018-07-01 DIAGNOSIS — E782 Mixed hyperlipidemia: Secondary | ICD-10-CM | POA: Diagnosis not present

## 2018-07-08 LAB — JAK 2 EXON 12 (GENPATH)

## 2018-07-08 LAB — JAK 2 V617F (GENPATH)

## 2018-07-15 ENCOUNTER — Other Ambulatory Visit: Payer: Self-pay

## 2018-07-15 DIAGNOSIS — C9001 Multiple myeloma in remission: Secondary | ICD-10-CM

## 2018-07-15 MED ORDER — REVLIMID 5 MG PO CAPS: 5.0000 mg | ORAL_CAPSULE | ORAL | 0 refills | Status: DC

## 2018-07-15 NOTE — Telephone Encounter (Signed)
Faxed script for Revlimid to [facility] at [phone]

## 2018-07-18 DIAGNOSIS — H10023 Other mucopurulent conjunctivitis, bilateral: Secondary | ICD-10-CM | POA: Diagnosis not present

## 2018-07-19 ENCOUNTER — Telehealth: Payer: Self-pay

## 2018-07-19 NOTE — Telephone Encounter (Signed)
Got it. OK to continue Revlimid, and call us back if he has other concerns.   Truitt Merle MD

## 2018-07-19 NOTE — Telephone Encounter (Signed)
Patient wanted to let Dr. Burr Medico know that he was treated for lyme disease about 3 to 4 weeks ago, now being treated for pink eye

## 2018-08-10 DIAGNOSIS — J329 Chronic sinusitis, unspecified: Secondary | ICD-10-CM | POA: Diagnosis not present

## 2018-08-10 DIAGNOSIS — J324 Chronic pansinusitis: Secondary | ICD-10-CM | POA: Diagnosis not present

## 2018-08-10 DIAGNOSIS — J019 Acute sinusitis, unspecified: Secondary | ICD-10-CM | POA: Diagnosis not present

## 2018-08-18 ENCOUNTER — Telehealth: Payer: Self-pay

## 2018-08-18 NOTE — Telephone Encounter (Signed)
Patient calls inquiring about his Revlimid as he had not heard from [facility] regarding the shipment.  Called [facility] spoke with a representative who informed me they received script and [employer] authorization however it did not get qued to schedule shipment on their end.  They will rectify this now.  Called patient back with above information and suggested he call into [facility], he already had the number.

## 2018-08-26 DIAGNOSIS — Z9484 Stem cells transplant status: Secondary | ICD-10-CM | POA: Diagnosis not present

## 2018-08-26 DIAGNOSIS — Z923 Personal history of irradiation: Secondary | ICD-10-CM | POA: Diagnosis not present

## 2018-08-26 DIAGNOSIS — G8929 Other chronic pain: Secondary | ICD-10-CM | POA: Diagnosis not present

## 2018-08-26 DIAGNOSIS — C9001 Multiple myeloma in remission: Secondary | ICD-10-CM | POA: Diagnosis not present

## 2018-08-26 DIAGNOSIS — C9000 Multiple myeloma not having achieved remission: Secondary | ICD-10-CM | POA: Diagnosis not present

## 2018-08-26 DIAGNOSIS — Z9221 Personal history of antineoplastic chemotherapy: Secondary | ICD-10-CM | POA: Diagnosis not present

## 2018-08-26 DIAGNOSIS — Z9481 Bone marrow transplant status: Secondary | ICD-10-CM | POA: Diagnosis not present

## 2018-08-26 DIAGNOSIS — M545 Low back pain: Secondary | ICD-10-CM | POA: Diagnosis not present

## 2018-09-13 ENCOUNTER — Other Ambulatory Visit: Payer: Self-pay

## 2018-09-13 DIAGNOSIS — C9001 Multiple myeloma in remission: Secondary | ICD-10-CM

## 2018-09-13 MED ORDER — REVLIMID 5 MG PO CAPS: 5.0000 mg | ORAL_CAPSULE | ORAL | 0 refills | Status: DC

## 2018-09-14 ENCOUNTER — Telehealth: Payer: Self-pay

## 2018-09-14 NOTE — Telephone Encounter (Signed)
Faxed script and [employer] Authorization to [facility].

## 2018-09-19 DIAGNOSIS — Z125 Encounter for screening for malignant neoplasm of prostate: Secondary | ICD-10-CM | POA: Diagnosis not present

## 2018-09-19 DIAGNOSIS — M859 Disorder of bone density and structure, unspecified: Secondary | ICD-10-CM | POA: Diagnosis not present

## 2018-09-19 DIAGNOSIS — I129 Hypertensive chronic kidney disease with stage 1 through stage 4 chronic kidney disease, or unspecified chronic kidney disease: Secondary | ICD-10-CM | POA: Diagnosis not present

## 2018-09-19 DIAGNOSIS — Z0000 Encounter for general adult medical examination without abnormal findings: Secondary | ICD-10-CM | POA: Diagnosis not present

## 2018-09-19 DIAGNOSIS — E782 Mixed hyperlipidemia: Secondary | ICD-10-CM | POA: Diagnosis not present

## 2018-09-19 DIAGNOSIS — I10 Essential (primary) hypertension: Secondary | ICD-10-CM | POA: Diagnosis not present

## 2018-09-19 DIAGNOSIS — N183 Chronic kidney disease, stage 3 (moderate): Secondary | ICD-10-CM | POA: Diagnosis not present

## 2018-09-19 DIAGNOSIS — M8580 Other specified disorders of bone density and structure, unspecified site: Secondary | ICD-10-CM | POA: Diagnosis not present

## 2018-09-19 DIAGNOSIS — Z23 Encounter for immunization: Secondary | ICD-10-CM | POA: Diagnosis not present

## 2018-09-26 ENCOUNTER — Telehealth: Payer: Self-pay | Admitting: Internal Medicine

## 2018-09-26 DIAGNOSIS — N183 Chronic kidney disease, stage 3 (moderate): Secondary | ICD-10-CM | POA: Diagnosis not present

## 2018-09-26 DIAGNOSIS — I214 Non-ST elevation (NSTEMI) myocardial infarction: Secondary | ICD-10-CM | POA: Diagnosis not present

## 2018-09-26 DIAGNOSIS — M8580 Other specified disorders of bone density and structure, unspecified site: Secondary | ICD-10-CM | POA: Diagnosis not present

## 2018-09-26 DIAGNOSIS — N200 Calculus of kidney: Secondary | ICD-10-CM | POA: Diagnosis not present

## 2018-09-26 DIAGNOSIS — E782 Mixed hyperlipidemia: Secondary | ICD-10-CM | POA: Diagnosis not present

## 2018-09-26 DIAGNOSIS — C9000 Multiple myeloma not having achieved remission: Secondary | ICD-10-CM | POA: Diagnosis not present

## 2018-09-26 DIAGNOSIS — I129 Hypertensive chronic kidney disease with stage 1 through stage 4 chronic kidney disease, or unspecified chronic kidney disease: Secondary | ICD-10-CM | POA: Diagnosis not present

## 2018-09-26 DIAGNOSIS — I429 Cardiomyopathy, unspecified: Secondary | ICD-10-CM | POA: Diagnosis not present

## 2018-09-26 DIAGNOSIS — K210 Gastro-esophageal reflux disease with esophagitis: Secondary | ICD-10-CM | POA: Diagnosis not present

## 2018-09-26 DIAGNOSIS — E876 Hypokalemia: Secondary | ICD-10-CM | POA: Diagnosis not present

## 2018-09-26 NOTE — Telephone Encounter (Signed)
New Message             [facility] Pre-operative Risk Assessment    Request for surgical clearance:  1. What type of surgery is being performed? ESS  2. When is this surgery scheduled? To be determined  3. What type of clearance is required (medical clearance vs. Pharmacy clearance to hold med vs. Both)? Both  4. Are there any medications that need to be held prior to surgery and how long? "Asprin 7 days prior  5. Practice name and name of physician performing surgery? [facility]  6. What is your office phone number [phone]   7.   What is your office fax [phone] 8.   Anesthesia type (None, local, MAC, general) ? "General   Porfirio Mylar 09/26/2018, 1:54 PM  _________________________________________________________________   (provider comments below)

## 2018-09-26 NOTE — Telephone Encounter (Signed)
Spoke with and appt scheduled with Robbie Lis, PA-C on October 2 at  2 pm. Pt verbalized understanding. Will fax over recommendations and appointment to requesting surgeon's office.

## 2018-09-26 NOTE — Telephone Encounter (Signed)
   Primary Cardiologist:Leonell C Hilty, MD (last seen 12/2017)  Chart reviewed as part of pre-operative protocol coverage. Because of Eric Dudley's past medical history and time since last visit, he/she will require a follow-up visit in order to better assess preoperative cardiovascular risk.  Pre-op covering staff: - Please schedule appointment and call patient to inform them. - Please contact requesting surgeon's office via preferred method (i.e, phone, fax) to inform them of need for appointment prior to surgery.  [place], [place]  09/26/2018, 2:59 PM

## 2018-09-27 ENCOUNTER — Encounter: Payer: Self-pay | Admitting: Physician Assistant

## 2018-09-28 ENCOUNTER — Ambulatory Visit (INDEPENDENT_AMBULATORY_CARE_PROVIDER_SITE_OTHER): Payer: Medicare Other | Admitting: Physician Assistant

## 2018-09-28 ENCOUNTER — Encounter: Payer: Self-pay | Admitting: Physician Assistant

## 2018-09-28 VITALS — BP 106/80 | HR 86 | Ht 69.0 in | Wt 136.8 lb

## 2018-09-28 DIAGNOSIS — C9001 Multiple myeloma in remission: Secondary | ICD-10-CM | POA: Diagnosis not present

## 2018-09-28 DIAGNOSIS — E785 Hyperlipidemia, unspecified: Secondary | ICD-10-CM | POA: Diagnosis not present

## 2018-09-28 DIAGNOSIS — I10 Essential (primary) hypertension: Secondary | ICD-10-CM

## 2018-09-28 DIAGNOSIS — I428 Other cardiomyopathies: Secondary | ICD-10-CM

## 2018-09-28 DIAGNOSIS — I2510 Atherosclerotic heart disease of native coronary artery without angina pectoris: Secondary | ICD-10-CM | POA: Diagnosis not present

## 2018-09-28 DIAGNOSIS — E782 Mixed hyperlipidemia: Secondary | ICD-10-CM

## 2018-09-28 NOTE — Patient Instructions (Signed)
Medication Instructions:  Your physician recommends that you continue on your current medications as directed. Please refer to the Current Medication list given to you today.  Labwork: None ordered.  Testing/Procedures: None ordered.  Follow-Up: Your physician recommends that you schedule a follow-up appointment in:   Dr Debara Pickett in January  Any Other Special Instructions Will Be Listed Below (If Applicable).     If you need a refill on your cardiac medications before your next appointment, please call your pharmacy.

## 2018-09-28 NOTE — Progress Notes (Addendum)
Cardiology Office Note    Date:  09/28/2018   ID:  Eric Dudley, DOB 16-May-1951, MRN [id number]  PCP:  [name], MD  Cardiologist:  Dr. Debara Pickett  Chief Complaint: Surgical clearance for ESS  History of Present Illness:   Eric Dudley is a 67 y.o. male HTN, HLD, CKD, NICM with subsequent normalization of LVEF, non obstructive CAD, chronic diastolic CHF, multiple myolemma s/p bone marrow transplant  and tonsillar cancer presents for surgical clearance.   Echo 03/2015 showed LVEF of 35-40%. Follow up cath showed non obstructive CAD (pRCA 30% & Ost LM 30%). LVEF of 55-60% by echo 11/2015. Last echo 11/2017 showed LVEF of 60-65%, grade 2 DD and mild MR.   Last seen by Dr. Debara Pickett 12/2017.  Here today for surgical clearance. No complains except sinus draining. He regular exercise but does weed eating without any issue. No chest pain, palpitations, dizziness, orthopnea, PND, syncope LE edema or melena. Mild dyspnea especially in dry environment.    Past Medical History:  Diagnosis Date  . Arthritis    neck and back pain  . Cholelithiases   . Chronic kidney disease    MILD,CHRONIC  . Hyperlipidemia   . Hypertension   . Multiple myeloma ([place])   . 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 APPENDECTOMY  1962  . CARDIAC CATHETERIZATION N/A 04/30/2015   Procedure: Left Heart Cath and Coronary Angiography;  Surgeon: Peter M Martinique, MD;  Location: [facility] INVASIVE CV LAB CUPID;  Service: Cardiovascular;  Laterality: N/A;  . CARDIOLITE MYOCARDIAL PERFUSION STUDY  04/16/03   NEGITIVE BRUCE PROTOCAL EXERCISE STRESS TEST. EF 70%. NO ISCHEMIA.  [name] KNEE SURGERY  2012   right  . SHOULDER SURGERY  1999   left    Current Medications: Prior to Admission medications   Medication Sig Start Date End Date Taking? Authorizing Provider  aspirin 81 MG chewable tablet Chew 1 tablet (81 mg total) by mouth daily. 05/01/15  Yes Caren Griffins, MD    calcium carbonate (OS-CAL) 600 MG TABS tablet Take 1,200 mg by mouth at bedtime.    Yes [provider]  Cholecalciferol (VITAMIN D3) 1000 units CAPS Take 2,000 mg by mouth daily.   Yes [provider]  montelukast (SINGULAIR) 10 MG tablet Take 1 tablet by mouth daily. 07/14/18  Yes [provider]  Multiple Vitamins-Minerals (CENTRUM SILVER ULTRA MENS PO) Take 1 tablet by mouth at bedtime.    Yes [provider]  naftifine (NAFTIN) 1 % cream Apply 1 application topically daily as needed.   Yes [provider]  nitroGLYCERIN (NITROSTAT) 0.4 MG SL tablet Place 0.4 mg under the tongue every 5 (five) minutes x 3 doses as needed for chest pain. Reported on 05/19/2016   Yes [provider]  ondansetron (ZOFRAN) 4 MG tablet TAKE 1 TABLET FOUR TIMES A DAY AS NEEDED FOR NAUSEA OR VOMITING 04/04/18  Yes Truitt Merle, MD  potassium chloride (KLOR-CON SPRINKLE) 10 MEQ CR capsule Take 2 tablets by mouth 3 (three) times daily. 02/11/16  Yes [provider]  ranitidine (ZANTAC) 150 MG tablet Take 2 tablets (300 mg total) by mouth at bedtime. 05/24/15  Yes Truitt Merle, MD  REVLIMID 5 MG capsule Take 1 capsule (5 mg total) by mouth as directed. Take 5 mg daily for 21 days, rest 7 days. 09/13/18  Yes Alla Feeling, NP  simvastatin (ZOCOR) 20 MG tablet Take 20 mg by  mouth daily.   Yes [provider]  valACYclovir (VALTREX) 1000 MG tablet Take 1 tablet (1,000 mg total) by mouth daily. 12/27/17  Yes Truitt Merle, MD  vitamin B-12 (CYANOCOBALAMIN) 500 MCG tablet Take 1 tablet by mouth daily.   Yes [provider]  zolpidem (AMBIEN) 5 MG tablet Take 1 tablet (5 mg total) by mouth at bedtime as needed for sleep. 04/26/17  Yes Theodis Blaze, MD    Allergies:   Hydrochlorothiazide; Asa [aspirin]; Calcium carbonate; Oxycodone; Amoxicillin; and Indapamide   Social History   Socioeconomic History  . Marital status: Married    Spouse name: Not on file   . Number of children: Not on file  . Years of education: Not on file  . Highest education level: Not on file  Occupational History  . Occupation: Retired  Scientific laboratory technician  . Financial resource strain: Not on file  . Food insecurity:    Worry: Not on file    Inability: Not on file  . Transportation needs:    Medical: Not on file    Non-medical: Not on file  Tobacco Use  . Smoking status: Former Smoker    Types: Cigars    Last attempt to quit: 12/29/2003    Years since quitting: 14.7  . Smokeless tobacco: Former Systems developer    Types: [place] date: 12/28/1994  Substance and Sexual Activity  . Alcohol use: No    Comment: occasional beer  . Drug use: No  . Sexual activity: Not on file  Lifestyle  . Physical activity:    Days per week: Not on file    Minutes per session: Not on file  . Stress: Not on file  Relationships  . Social connections:    Talks on phone: Not on file    Gets together: Not on file    Attends religious service: Not on file    Active member of club or organization: Not on file    Attends meetings of clubs or organizations: Not on file    Relationship status: Not on file  Other Topics Concern  . Not on file  Social History Narrative  . Not on file     Family History:  The patient's family history includes Cancer in his father and sister; Cancer (age of onset: 29) in his mother; Hypertension in his sister.  ROS:   Please see the history of present illness.    ROS All other systems reviewed and are negative.   PHYSICAL EXAM:   VS:  BP 106/80   Pulse 86   Ht '5\' 9"'  (1.753 m)   Wt 136 lb 12.8 oz (62.1 kg)   SpO2 98%   BMI 20.20 kg/m    GEN: Well nourished, well developed, in no acute distress  HEENT: normal  Neck: no JVD, carotid bruits, or masses Cardiac: RRR; no murmurs, rubs, or gallops,no edema  Respiratory:  clear to auscultation bilaterally, normal work of breathing GI: soft, nontender, nondistended, + BS MS: no deformity or atrophy  Skin:  warm and dry, no rash Neuro:  Alert and Oriented x 3, Strength and sensation are intact Psych: euthymic mood, full affect  Wt Readings from Last 3 Encounters:  09/28/18 136 lb 12.8 oz (62.1 kg)  06/16/18 150 lb 11.2 oz (68.4 kg)  03/24/18 159 lb 8 oz (72.3 kg)      Studies/Labs Reviewed:   EKG:  EKG is ordered today.  The ekg ordered today demonstrates NSR at rate  of 86 bpm  Recent Labs: 06/16/2018: ALT 64; BUN 9; Creatinine, Ser 1.48; Hemoglobin 18.0; Platelets 188; Potassium 2.8; Sodium 136   Lipid Panel    Component Value Date/Time   CHOL 185 01/10/2018 0952   TRIG 172 (H) 01/10/2018 0952   HDL 55 01/10/2018 0952   CHOLHDL 3.4 01/10/2018 0952   LDLCALC 96 01/10/2018 0952    Additional studies/ records that were reviewed today include:   Echocardiogram: 11/2017 Study Conclusions  - Left ventricle: The cavity size was normal. Wall thickness was   normal. Systolic function was normal. The estimated ejection   fraction was in the range of 60% to 65%. Wall motion was normal;   there were no regional wall motion abnormalities. Features are   consistent with a pseudonormal left ventricular filling pattern,   with concomitant abnormal relaxation and increased filling   pressure (grade 2 diastolic dysfunction). - Aortic valve: Mildly calcified annulus. Moderately thickened   leaflets. There was trivial regurgitation. - Mitral valve: There was mild regurgitation. Valve area by   pressure half-time: 1.86 cm^2.  Left Heart Cath and Coronary Angiography 04/30/15  Conclusion    Prox RCA lesion, 30% stenosed.  Ost LM to LM lesion, 30% stenosed.   1. Nonobstructive CAD 2. Normal LVEDP  Recommendations: Medical management.    ASSESSMENT & PLAN:    1. Non obstructive CAD No angina. Continue ASA and statin.   2. Chronic diastolic CHF with hx of NICM -Last echo 11/2017 showed LVEF of 60-65%, grade 2 DD and mild MR. Euvolemic.   3. Surgical clearance  He is getting  at least 4 mets of activity per DASI. Given past medical history and based on ACC/AHA guidelines, Eric Dudley would be at acceptable risk for the planned procedure without further cardiovascular testing.   Will ask Dr. Debara Pickett if okay to hold ASA for 5-7 days.   Addendum:  Per Dr. Debara Pickett "Yes , ok to hold ASA for surgery".   [place], [place] 09/28/2018, 2:15 PM         Medication Adjustments/Labs and Tests Ordered: Current medicines are reviewed at length with the patient today.  Concerns regarding medicines are outlined above.  Medication changes, Labs and Tests ordered today are listed in the Patient Instructions below. Patient Instructions  Medication Instructions:  Your physician recommends that you continue on your current medications as directed. Please refer to the Current Medication list given to you today.  Labwork: None ordered.  Testing/Procedures: None ordered.  Follow-Up: Your physician recommends that you schedule a follow-up appointment in:   Dr Debara Pickett in January  Any Other Special Instructions Will Be Listed Below (If Applicable).     If you need a refill on your cardiac medications before your next appointment, please call your pharmacy.     Jarrett [place], [place]  09/28/2018 2:13 PM    [facility] [place], [place], [place]  [address] Phone: [phone]; Fax: [phone]

## 2018-10-10 DIAGNOSIS — H524 Presbyopia: Secondary | ICD-10-CM | POA: Diagnosis not present

## 2018-10-10 DIAGNOSIS — H04123 Dry eye syndrome of bilateral lacrimal glands: Secondary | ICD-10-CM | POA: Diagnosis not present

## 2018-10-10 DIAGNOSIS — H2513 Age-related nuclear cataract, bilateral: Secondary | ICD-10-CM | POA: Diagnosis not present

## 2018-10-10 DIAGNOSIS — H52203 Unspecified astigmatism, bilateral: Secondary | ICD-10-CM | POA: Diagnosis not present

## 2018-10-11 ENCOUNTER — Other Ambulatory Visit: Payer: Self-pay | Admitting: Hematology

## 2018-10-11 DIAGNOSIS — C9001 Multiple myeloma in remission: Secondary | ICD-10-CM

## 2018-10-11 MED ORDER — REVLIMID 5 MG PO CAPS: 5.0000 mg | ORAL_CAPSULE | ORAL | 0 refills | Status: DC

## 2018-10-12 ENCOUNTER — Telehealth: Payer: Self-pay

## 2018-10-12 NOTE — Telephone Encounter (Signed)
Script for Revlimid faxed to [facility]

## 2018-10-12 NOTE — Progress Notes (Signed)
[place]  [phone] [phone]  Clinic Follow Up Note   Patient Care Team: [name], MD as PCP - General (Internal Medicine) Debara Pickett Nadean Corwin, MD as PCP - Cardiology (Cardiology) Debara Pickett Nadean Corwin, MD as Consulting Physician (Cardiology) Truitt Merle, MD as Consulting Physician (Hematology)   Date of Service:  10/13/2018   CHIEF COMPLAINTS: Follow-up multiple myeloma  Oncology History   Cancer Staging Multiple myeloma in remission [facility]) Staging form: Multiple Myeloma, AJCC 6th Edition - Clinical: No stage assigned - Unsigned Staging comments: Stage I Prognostic indicators: Standard risk by cytogenetics       Multiple myeloma in remission ([place])   01/31/2015 Tumor Marker    SPEP showed M-SPIKE 1.7g/dl, Cr 1.5, no anemia, hypercalcemia    03/19/2015 Imaging    Bone survey showed no discrete lytic lesion, but there is osteopenia and calvarial heterogeneity.    03/27/2015 Initial Diagnosis    Multiple myeloma, stage I    03/27/2015 Bone Marrow Biopsy    Bone Marrow, Aspirate,Biopsy: - HYPERCELLULAR BONE MARROW FOR AGE WITH PLASMA CELL NEOPLASM 47% - SEE COMMENT. PERIPHERAL BLOOD: - NO SIGNIFICANT MORPHOLOGIC ABNORMALITIES. Diagnosis Note The bone marrow shows increased number of     03/27/2015 Miscellaneous    bone marrow Cytogenetics: normal. FISH panel showed the presence of +4 and +11, this is consider standard risk of MM     04/16/2015 - 04/23/2015 Hospital Admission    He was admitted for worsening back pain, was found to have a T12 pathological fracture. He underwent kyphoplasty on 04/19/2015.    04/17/2015 Imaging    MR Thoracic and Lumbar Spine w/o Contrast 04/17/2015 IMPRESSION: MR THORACIC SPINE IMPRESSION: Pathologic compression fracture at T8 in this patient with widespread multiple myeloma. Slight retropulsion of abnormal bone without conus compression. MR LUMBAR SPINE IMPRESSION: Focal myeloma deposits at L3 and  T9 without significant compression deformity. Mild stenosis at L4-5 related to ordinary spondylosis.    04/19/2015 - 08/02/2015 Chemotherapy    RVD with weekly Velcade 1.3 mg/m, dexamethasone 40 mg, and Revlimid 10 mg daily on day 1-21, every 28 days, cycle 1 Revlimid was interupted by hospitalization, cycle 3 Revlimid dose increased to 25 mg daily due to his improved renal function. total 4 cyc    04/26/2015 - 05/01/2015 Hospital Admission    He was admitted to [facility] for syncope episode during his radiation simulation. EKG showed ST elevation, troponin was positive, he underwent cardio catheterization which showed mild stenosis. No intervention was needed. echo showed EF 35-40%    04/29/2015 - 05/10/2015 Radiation Therapy    palliative RT to T12, 20Gy in 10 fractions    08/19/2015 Bone Marrow Transplant    He underwent autologous stem cell transplant at [facility].     09/30/2015 Imaging    CT anginal chest 09/30/2015 IMPRESSION: 1. No pulmonary embolism. 2. No thoracic aortic aneurysm or dissection. 3. Heart size is normal. No pericardial effusion. 4. Mild scarring/atelectasis at each lung base, probably chronic. Lungs otherwise clear. No evidence of pneumonia. No pleural effusion. 5. Evidence of diffuse multiple myeloma involvement throughout the thoracolumbar spine and multiple ribs bilaterally, with areas of present clinical relevance described below. 6. Compression fracture deformity of the T12 vertebral body, known pathologic fracture, status post treatment with vertebroplasty on 04/19/2015. 7. Milder compression deformity of the overlying T11 vertebral body, approximately 40% compressed anteriorly, also presumably pathologic. This vertebral body appeared essentially normal on fluoroscopic images from the earlier aforementioned vertebroplasty  of 04/19/2015. 8. Slight irregularity of the right lateral sixth rib, with subtle cortical sclerosis suggesting nondisplaced  healing fracture. This may be a relatively acute fracture and is probably pathologic in nature related to underlying multiple myeloma. 9. Similar irregularity of the left lateral fifth rib, suggesting minimally displaced healing fracture. This may also be a relatively acute fracture and is likely pathologic in nature related to underlying multiple myeloma. These acute or subacute rib fractures are the most likely source of patient's bilateral chest pain. Additional incidental findings in the upper abdomen: Fatty infiltration of liver, cholelithiasis without evidence of acute cholecystitis, fairly large amount of stool within the nondistended colon of the upper abdomen (constipation?).    11/27/2015 Bone Marrow Biopsy    hypocellular marrow (10%), no increased plasma cells     12/29/2015 -  Chemotherapy    maintenance Revlimid 47m daily, 3 weeks on, one week off   Held for Hospital stay and restarted after recovery    04/22/2017 - 04/26/2017 Hospital Admission    Admit date: 04/22/17-04/26/17 Admission diagnosis: Shingles Herpes Zoster Additional comments: Held Revlimid during hospital stay and healing    08/31/2017 - 09/03/2017 Hospital Admission    Admit date: 08/31/17 Admission diagnosis: Pneumonia  Additional comments: [facility] and was admitted to hospital 08/31/17 -Treated with oral Levaquin after broad-spectrum antibiotics in hospital  -Held Revlimid until next follow up*      HISTORY OF PRESENTING ILLNESS (03/15/2015):  Eric Gitelman67y.o. 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  He has been having low back pain for 3-4 months. He had food poison 4 month ago, and had frequent diarrhea. He started noticed low back pain since then. The pain is not radiating to leg, he also has intermittent right rib pain, worse  with cough and sneezing. He remains to be physically active, able to do all the activities, such as gardening work housework without much limitation, but he doesn't sings slowly because of back pain. He takes Tylenol as needed, does not like the necrotic pain medication. No night sweats, no fever or chills, no weight loss.   He was seen by his primary care physician Dr. RMerrilee Seashore MD and had lab test (see below). He also had bone density scan 2/25 which showed osteoporosis, has not been treated yet.  Current therapy:   Maintenance Revlimid 5 mg daily, 3 weeks on, one-week off, started on 12/29/2015. Revlimid Held for 4 weeks due to shingles episode, restarted week of 05/22/17. Held since 08/31/17 while hospitalized for Pneumonia and Restarted 09/18/17.   INTERIM HISTORY:  Mr. TGueyereturns for follow-up. His JAK 2 mutation testing was negative. He has been following up with Cardiologist Dr. BCurly Shoresin the interim. He presents to the clinic today by himself. He notes he has aches all over and plans to have [facility] which is [facility] He previously tried antibiotics for his sinuses before which worked but returned afterward.  He notes his left flank is numbing and his left upper left is tender. He can ambulate but with stumbling. He notes b/l jaw locking with pain. He is seeing his dentist. His bone exposure is improving. He is able to eat adequately. He notes right sided neck pain which he thinks is a pinched nerve. He has been losing  weight even while eating. He notes he could not swallow acyclovir and stopped taking it. He notes his pain is not being controlled with Tramadol anymore. He would like to try Tylenol #3.      MEDICAL HISTORY:  Past Medical History:  Diagnosis Date  . Arthritis    neck and back pain  . Cholelithiases   . Chronic kidney disease    MILD,CHRONIC  . Hyperlipidemia   . Hypertension   . Multiple myeloma ([place])   .  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 APPENDECTOMY  1962  . CARDIAC CATHETERIZATION N/A 04/30/2015   Procedure: Left Heart Cath and Coronary Angiography;  Surgeon: Peter M Martinique, MD;  Location: [facility] INVASIVE CV LAB CUPID;  Service: Cardiovascular;  Laterality: N/A;  . CARDIOLITE MYOCARDIAL PERFUSION STUDY  04/16/03   NEGITIVE BRUCE PROTOCAL EXERCISE STRESS TEST. EF 70%. NO ISCHEMIA.  [name] KNEE SURGERY  2012   right  . SHOULDER SURGERY  1999   left    SOCIAL HISTORY: History   Social History  . Marital Status: Married    Spouse Name: N/A  . Number of Children: 1   . Years of Education: N/A   Occupational History  . A retired Therapist, occupational    Social History Main Topics  . Smoking status: Former Smoker    Types: Cigars    Quit date: 12/29/2003  . Smokeless tobacco: Former Systems developer    Types: Chew    Quit date: 12/28/1994  . Alcohol Use: Yes     Comment: occasional beer  . Drug Use: No  . Sexual Activity: Not on file   Other Topics Concern  . Not on file   Social History Narrative  . No narrative on file    FAMILY HISTORY: Family History  Problem Relation Age of Onset  . Cancer Mother 49       cervical cancer   . Cancer Father        unknown cancer   . Hypertension Sister   . Cancer Sister        melanoma     ALLERGIES:  is allergic to hydrochlorothiazide; asa [aspirin]; calcium carbonate; oxycodone; amoxicillin; and indapamide.  MEDICATIONS:  Current Outpatient Medications  Medication Sig Dispense Refill  . acetaminophen-codeine (TYLENOL #3) 300-30 MG tablet Take 1 tablet by mouth every 8 (eight) hours as needed for severe pain. 20 tablet 0  . aspirin 81 MG chewable tablet Chew 1 tablet (81 mg total) by mouth daily.    . calcium carbonate (OS-CAL) 600 MG TABS tablet Take 1,200 mg by mouth at bedtime.     . Cholecalciferol (VITAMIN D3) 1000 units CAPS Take 2,000 mg by mouth daily.    . montelukast  (SINGULAIR) 10 MG tablet Take 1 tablet by mouth daily.  1  . Multiple Vitamins-Minerals (CENTRUM SILVER ULTRA MENS PO) Take 1 tablet by mouth at bedtime.     . naftifine (NAFTIN) 1 % cream Apply 1 application topically daily as needed.    . nitroGLYCERIN (NITROSTAT) 0.4 MG SL tablet Place 0.4 mg under the tongue every 5 (five) minutes x 3 doses as needed for chest pain. Reported on 05/19/2016    . ondansetron (ZOFRAN) 4 MG tablet TAKE 1 TABLET FOUR TIMES A DAY AS NEEDED FOR NAUSEA OR VOMITING 30 tablet 2  . potassium chloride (KLOR-CON SPRINKLE) 10 MEQ CR capsule Take 2 tablets by mouth 3 (three) times daily.    [name]  ranitidine (ZANTAC) 150 MG tablet Take 2 tablets (300 mg total) by mouth at bedtime. 60 tablet 1  . REVLIMID 5 MG capsule Take 1 capsule (5 mg total) by mouth as directed. Take 5 mg daily for 21 days, rest 7 days. 21 capsule 0  . simvastatin (ZOCOR) 20 MG tablet Take 20 mg by mouth daily.    . valACYclovir (VALTREX) 1000 MG tablet Take 1 tablet (1,000 mg total) by mouth daily. 60 tablet 2  . vitamin B-12 (CYANOCOBALAMIN) 500 MCG tablet Take 1 tablet by mouth daily.    [name] zolpidem (AMBIEN) 5 MG tablet Take 1 tablet (5 mg total) by mouth at bedtime as needed for sleep. 30 tablet 0   No current facility-administered medications for this visit.     REVIEW OF SYSTEMS:   Constitutional: Denies fevers, chills or abnormal night sweats Eyes: Denies blurriness of vision, double vision or watery eyes Ears, nose, mouth, throat, and face: Denies mucositis or sore throat (+) bone exposure between last 2 right lower molars, improving  Respiratory: Denies cough, dyspnea or wheezes Cardiovascular: Denies palpitation, chest discomfort or lower extremity swelling Gastrointestinal:  Denies nausea, heartburn or change in bowel habits Skin: Negative MSK: (+) Chronic back pain, left leg tenderness, right neck pain, b/l jaw pain, numbing of left flank Lymphatics: Denies new lymphadenopathy or easy  bruising Neurological: negative Behavioral/Psych: Mood is stable, no new changes  Musculoskeletal: negative All other systems were reviewed with the patient and are negative.  PHYSICAL EXAMINATION:  ECOG PERFORMANCE STATUS: 1 BP 114/68 (BP Location: Right Arm, Patient Position: Sitting)   Pulse 68   Temp 98 F (36.7 C) (Oral)   Resp 18   Ht '5\' 9"'  (1.753 m)   Wt 135 lb 1.6 oz (61.3 kg)   SpO2 98%   BMI 19.95 kg/m   GENERAL: well-appearing, no distress SKIN: skin color, texture, turgor are normal, no rashes or significant lesions except to some healing rash on his left upper face, his abdomen due to shingles EYES: normal, conjunctiva are pink and non-injected, sclera clear OROPHARYNX:no exudate, no erythema and lips, buccal mucosa, and tongue normal  (+) bone exposure between the last 2 right lower molars, non-tender, no discharge NECK: supple, thyroid normal size, non-tender, without nodularity LYMPH:  no palpable lymphadenopathy in the cervical, axillary or inguinal LUNGS: clear to auscultation and percussion with normal breathing effort HEART: regular rate & rhythm and no murmurs and no lower extremity edema ABDOMEN:abdomen soft, non-tender and normal bowel sounds Musculoskeletal:no cyanosis of digits and no clubbing, mild tenderness on bilateral lateral chest wall.  PSYCH: alert & oriented x 3 with fluent speech NEURO: no focal motor/sensory deficits CHEST WALL: slightly enlarged left breast, mild tenderness, no palpable mass. He states his left breast is slightly bigger than right for all his adult life.  LABORATORY DATA:   CBC Latest Ref Rng & Units 10/13/2018 06/16/2018 03/24/2018  WBC 4.0 - 10.5 K/uL 4.0 6.5 4.2  Hemoglobin 13.0 - 17.0 g/dL 17.7(H) 18.0(H) 17.2(H)  Hematocrit 39.0 - 52.0 % 51.8 51.4(H) 51.3(H)  Platelets 150 - 400 K/uL 184 188 172   CMP Latest Ref Rng & Units 10/13/2018 06/16/2018 03/24/2018  Glucose 70 - 99 mg/dL 80 126 86  BUN 8 - 23 mg/dL 7(L) 9 6(L)   Creatinine 0.61 - 1.24 mg/dL 1.07 1.48(H) 1.28  Sodium 135 - 145 mmol/L 137 136 138  Potassium 3.5 - 5.1 mmol/L 3.6 2.8(LL) 3.2(L)  Chloride 98 - 111 mmol/L 103 101 104  CO2 22 - 32 mmol/L '24 26 26  ' Calcium 8.9 - 10.3 mg/dL 9.1 9.6 9.5  Total Protein 6.5 - 8.1 g/dL 6.4(L) 6.6 7.1  Total Bilirubin 0.3 - 1.2 mg/dL 0.5 0.6 0.7  Alkaline Phos 38 - 126 U/L 157(H) 116 109  AST 15 - 41 U/L 24 31 35(H)  ALT 0 - 44 U/L 26 64(H) 52    MM lab:  SPEP M-protein (g/dl) 03/15/2015: 1.6 05/24/2015: 0.5 06/21/2015: 0.4 07/18/2015 (at [facility]): 0.33 11/27/2015: not det 01/21/2016: not det  03/24/2016: not det 05/19/2016: not det  08/07/2016: not det  10/30/2017: not det 03/18/2017: Not det 06/25/17: not det.  09/17/17: not det.  12/27/17: 0.2 03/03/18: 0.1 03/24/18: Not Observed 06/16/18: Not observed 08/26/3018: 0.24 (at [place]) 10/13/18: PENDING   Serum IgG (mg/dl) 03/15/2015: 2000 05/24/2015: 579 06/21/2015: 673 07/18/2015 ([facility]): 509 11/27/2015: 396 01/21/2016: 487 03/24/2016: 563 05/19/2016: 600 08/07/2016: 530 10/30/2016: 589 03/18/2017: 584 06/25/17: 645 09/17/17: 542 12/27/17: 705 03/03/18: 660 03/24/18: 774 06/16/18:  620 10/13/18: PENDING  Serum kappa/lamda/ratio (mg/dl)  03/15/2015: 690, 0.13, 5308 05/24/2015: 146, 1.27, 115.0 06/21/2015: 86, 0.74, 116.2 01/21/2016: 1.09, 1.21, 0.9 03/24/2016: 1.72, 1.49, 1.16 05/19/2016: 1.91, 1.50, 1.27 08/07/2016: 1.36, 1.41, 0.96  10/30/2016: 1.6, 1.32, 1.21  03/18/2017: 1.67, 1.55, 1.08 06/25/17: 1.51, 1.46, 1.03 09/17/17: 1.14, 1.17, 0.97 12/27/17: 1.74, 1.90, 0.92 03/24/18: 2.54, 1.73, 1.47 06/16/18: 15.3, 10.8, 1.42 10/13/18: PENDING   24 h urine kappa light chain (mg) 03/18/2015: 2434 05/28/2015: 582 06/24/2015: 322 04/27/2016: 499  03/28/2018: 231    PATHOLOGY REPORT  06/16/2018 Molecular Pathology    Bone Marrow, Aspirate,Biopsy, and Clot, right iliac 03/27/2015 - HYPERCELLULAR BONE MARROW FOR AGE WITH PLASMA CELL NEOPLASM. - SEE  COMMENT. PERIPHERAL BLOOD: - NO SIGNIFICANT MORPHOLOGIC ABNORMALITIES. Diagnosis Note The bone marrow shows increased number of atypical plasma cells representing 47% of all cells in the aspirate associated with prominent interstitial infiltrates and variably sized aggregates in the clot and biopsy sections. Immunohistochemical stains show that the plasma cells are kappa light chain restricted consistent with plasma cell neoplasm. The background shows trilineage Cytogenetics: normal FISH panel: +4 and +11  Bone Marrow, Aspirate,Biopsy, and Clot, 11/27/2015 at [place]: [id number] RECEIVED: 11/27/2015 ORDERING PHYSICIAN: CESAR Melburn Hake , MD PATIENT NAME: Eric Dudley BONE MARROW REPORT Bone Marrow (BM) and Peripheral Blood (PB) FINAL PATHOLOGIC DIAGNOSIS BONE MARROW: Hypocellular marrow (10%) with no increase in plasma cells. See comment. PERIPHERAL BLOOD: Absolute lymphopenia. See CBC data. COMMENT: The touch prep is cellular and adequate for evaluation. Rare plasma cells are present (<1%). Blasts are not increased, Myeloid and erythroid cells are present in normal proportions with intact maturation and no overt dysplastic changes. Megakaryocytes are absent. The aspirate smears are aspicular and hemodilute. Bone marrow and clot sections are variably hypocellular (0-20% range, 10% overall). Scattered interstitial plasma cells are present and comprise <5% of marrow cellularity by CD138 immunohistochemical analysis. Blasts are not increased. Myeloid and erythroid cells are present in normal proportions and have no overt morphologic abnormalities. Megakaryocytes are morphologically within normal limits. Examination of the peripheral blood reveals absolute lymphopenia. There are no circulating plasma cells or evidence of rouleaux formation.   RADIOGRAPHIC STUDIES:  Chest Xray 08/31/17 IMPRESSION: 1. Left perihilar consolidation suspicious for  pneumonia in the setting of fever. Followup PA and lateral chest X-ray is recommended in 3-4 weeks following trial of antibiotic therapy to ensure resolution and exclude underlying malignancy. 2. Small well-circumscribed nodular opacity projecting over the right lung base may be  a nipple shadow, pulmonary nodule is not entirely excluded. Consider including nipple markers on follow-up exam.  ASSESSMENT & PLAN:  67 y.o. Caucasian male with past medical history of hypertension, tonsil cancer status post surgery and radiation 10 years ago, now presented with 3-4 months low back pain and intermittent rib pain. His blood test showed M protein 1.7g/dl.  1. IgG multiple myeloma, stage I, standard risk by cytogenetics (+4 and +11), in remission  -I previously discussed his initial bone marrow biopsy results with him. He has significant amount of plasma cells in the bone marrow 47%.  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 Based on the new notable myeloma diagnostic criteria, he meets the criteria of multiple myeloma. He has normal albumin and mildly elevated beta 2 microglobulin, this is stage I. -His cytogenetics and Fish panel revealed standard risk. -His thoracic and lumbar spine MRI showed pathologic compression fracture at T12, focal myeloma deposits at L3 and T9, diffuse marrow signal abnormality consistent with myeloma -I previously recommended induction chemotherapy with VRD regimen: weekly Velcade and dexamethasone, first 2 cycles Revlimid dose reduced to 10 mg daily 3 week on, one-week off, based on his renal function, and it which was increased to full dose from cycle 3. He received a total of 4 cycles of treatment, achieved a very good partial  response. Repeat bone marrow on 07/18/2015 showed 5% plasma cells, fish and cytogenetics was normal. -His post transplant evaluation previously showed complete remission (<1% plasma cell in marrow, undetectable M protein), and he has recovered very well. -He previously started maintenance Revlimid 5 mg daily, 3 weeks on, 1 week off, tolerating well. - previously Discussed possibility of Revlimid may increasing risk of secondary cancer occurring.  -Plan to have colonoscopy in 2020, he does PSA screening at [facility]  -He has completed Zometa every 3 months after 2 years in 08/2017.  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  -His disease is standard risk. We previously discussed the pros and cons of continue maintenance therapy versus stopping. His M protein became detectable in 11/2017, possible related to a few courses of holding Revlimid due to hospitalization, as it started to trend down to 0.1. I would favor continue maintenance therapy. I encouraged pt to discuss stopping maintenance with Dr. Norma Fredrickson at his next appointment in August 2019 -I previously recommended he use Vitamin C and continue Baby aspirin to reduce risk of blood clots.  -His JAK 2 testing was negative for mutations, which rules out PV. I reviewed and discussed with the patient.  -Labs reviewed today, Hg at 17.7, BUN at 7, alk phos at 157. His MM panel and 24 hour urine is still pending. His M-Spike at [facility] was 0.2 on 08/26/18. If M-Protein returns positive, I will discuss with Dr. Norma Fredrickson to determine if he should proceed with bone marrow biopsy.  -will repeat bone survey. -He has developed slightly worsening body pain, and has lost some weight, despite good appetite.  This is concerning for multiple myeloma progression. -Labs overall adequate to continue Revlimid.  -F/u in 2 months     2. Disseminated Shingles in 03/2017 -He was admitted to the  hospital for Shingles in 03/2017. This flared due to compromised immune system while on Revlimid -He previously restarted acyclovir 1056m daily for prophylaxis. He will continue on Acyclovir while on Revlimid -Will follow up with PCP to review when to stop treatment -He has stopped taking acyclovir due to difficulty swallowing it.  -He is cleared to proceed with shingles vaccine this year. I suggest he wait until after his nasal surgery.   3. T12 pathologic compression fracture, and diffuse myeloma involvement in bones -Status post kyphoplasty and palliative radiation  -He previously received palliative Radiation, pain much improved overall. He does complain more mid back and right-sided chest pain daily, likely related to his physical activities. -continue zometa infusion every 3 months, he has been seeing his dentist regularly. -He completed Zometa 09/17/17 -Will repeat bone survey next Monday  4. nonobstructive CAD, STEMI, Non ischemic CM, Takatsubo, with EF of 35-40% -Patient was initially found to have troponin elevation and anterior apical ST elevation, MI and resultant cardiomyopathy on 04/26/15 -He previously underwent a cardiac catheterization on 04/30/15 which showed mild diffuse disease, no stents were placed. He recovered well.  -He is on Aspirin.  -12/13/17 ECHO showed normal EF at 60-65% -Continue to follow up with Dr. HDebara Pickett  5. Peripheral neuropathy, grade 1  -Likely secondary to his bone marrow transplant and chemotherapy -Not mentioned again in today's visit, likely much improved or resolved. we'll continue observation   6.  Polycythemia -His routine lab has showed increased hemoglobin and hematocrit lately, he quit smoking in 2005, does not have sleep apnea, does not use testosterone injection.  No obvious secondary cause for polycythemia -Jak2 [id number] and exon 12 mutation testing was negative -Hg at 17.7, HCT  at 51.8 today (10/13/18)  7. Right Jaw necrosis  -He previously had some right side jaw/gum pain, examined by his dentist and oral surgeon, possible jaw necrosis which could be related to zometa  -he has completed 2 year zometa in 08/2017 -He has bone exposure between the last 2 right lower molars. I previously encouraged him to keep his mouth clean and rinse mouth regularly.  -Bone exposure is improving with intermittent numbness, he will continue to follow up with PCP. Continue oral calcium.   8. Hypokalemia  -Continue oral Potassium BID and increase potassium in diet  -K down to 3.6 today (10/13/18)  -Continue oral potassium TID   9. Chronic back pain, Left leg tenderness, jaw pain  -He has been on tramadol which is no longer touching his pain.  -I will prescribe him Tylenol #3 20 tabs to use as needed. I explained this is low dose narcotics and to only take when needed for uncontrolled pain.     10.  Recurrent sinus infection -She has been seen by ENT, and is scheduled to have a nasal surgery tomorrow.  Plan -Prescribed Tylenol #3 today  -continue Revlimid  -Bone survey next Monday -Lab and f/u in 2 months, or sooner if M-protein increases      All questions were answered. The patient knows to call the clinic with any problems, questions or concerns.  I spent 20 minutes counseling the patient face to face. The total time spent in the appointment was 25 minutes and more than 50% was on counseling.  IOneal Deputy am acting as scribe for YTruitt Merle MD.    I have reviewed the above documentation for accuracy and completeness, and I agree with the above.      YTruitt Merle MD 10/13/2018

## 2018-10-13 ENCOUNTER — Encounter: Payer: Self-pay | Admitting: Hematology

## 2018-10-13 ENCOUNTER — Telehealth: Payer: Self-pay | Admitting: Internal Medicine

## 2018-10-13 ENCOUNTER — Inpatient Hospital Stay: Payer: Medicare Other | Attending: Hematology | Admitting: Hematology

## 2018-10-13 ENCOUNTER — Inpatient Hospital Stay: Payer: Medicare Other

## 2018-10-13 VITALS — BP 114/68 | HR 68 | Temp 98.0°F | Resp 18 | Ht 69.0 in | Wt 135.1 lb

## 2018-10-13 DIAGNOSIS — C9001 Multiple myeloma in remission: Secondary | ICD-10-CM | POA: Diagnosis not present

## 2018-10-13 DIAGNOSIS — R6884 Jaw pain: Secondary | ICD-10-CM

## 2018-10-13 DIAGNOSIS — D751 Secondary polycythemia: Secondary | ICD-10-CM | POA: Diagnosis not present

## 2018-10-13 DIAGNOSIS — Z87891 Personal history of nicotine dependence: Secondary | ICD-10-CM

## 2018-10-13 DIAGNOSIS — G8929 Other chronic pain: Secondary | ICD-10-CM | POA: Diagnosis not present

## 2018-10-13 DIAGNOSIS — M545 Low back pain: Secondary | ICD-10-CM | POA: Insufficient documentation

## 2018-10-13 DIAGNOSIS — M8580 Other specified disorders of bone density and structure, unspecified site: Secondary | ICD-10-CM

## 2018-10-13 DIAGNOSIS — M79605 Pain in left leg: Secondary | ICD-10-CM

## 2018-10-13 DIAGNOSIS — Z79899 Other long term (current) drug therapy: Secondary | ICD-10-CM | POA: Diagnosis not present

## 2018-10-13 DIAGNOSIS — I10 Essential (primary) hypertension: Secondary | ICD-10-CM

## 2018-10-13 DIAGNOSIS — E876 Hypokalemia: Secondary | ICD-10-CM | POA: Diagnosis not present

## 2018-10-13 DIAGNOSIS — N182 Chronic kidney disease, stage 2 (mild): Secondary | ICD-10-CM

## 2018-10-13 DIAGNOSIS — I428 Other cardiomyopathies: Secondary | ICD-10-CM

## 2018-10-13 DIAGNOSIS — R634 Abnormal weight loss: Secondary | ICD-10-CM | POA: Diagnosis not present

## 2018-10-13 LAB — CBC WITH DIFFERENTIAL/PLATELET
Abs Immature Granulocytes: 0.01 10*3/uL (ref 0.00–0.07)
BASOS ABS: 0.1 10*3/uL (ref 0.0–0.1)
Basophils Relative: 1 %
EOS ABS: 0.2 10*3/uL (ref 0.0–0.5)
EOS PCT: 5 %
HEMATOCRIT: 51.8 % (ref 39.0–52.0)
Hemoglobin: 17.7 g/dL — ABNORMAL HIGH (ref 13.0–17.0)
IMMATURE GRANULOCYTES: 0 %
LYMPHS ABS: 0.5 10*3/uL — AB (ref 0.7–4.0)
Lymphocytes Relative: 13 %
MCH: 34.2 pg — ABNORMAL HIGH (ref 26.0–34.0)
MCHC: 34.2 g/dL (ref 30.0–36.0)
MCV: 100 fL (ref 80.0–100.0)
Monocytes Absolute: 0.9 10*3/uL (ref 0.1–1.0)
Monocytes Relative: 22 %
NEUTROS PCT: 59 %
NRBC: 0 % (ref 0.0–0.2)
Neutro Abs: 2.4 10*3/uL (ref 1.7–7.7)
PLATELETS: 184 10*3/uL (ref 150–400)
RBC: 5.18 MIL/uL (ref 4.22–5.81)
RDW: 14.5 % (ref 11.5–15.5)
WBC: 4 10*3/uL (ref 4.0–10.5)

## 2018-10-13 LAB — COMPREHENSIVE METABOLIC PANEL
ALBUMIN: 3.3 g/dL — AB (ref 3.5–5.0)
ALT: 26 U/L (ref 0–44)
ANION GAP: 10 (ref 5–15)
AST: 24 U/L (ref 15–41)
Alkaline Phosphatase: 157 U/L — ABNORMAL HIGH (ref 38–126)
BUN: 7 mg/dL — AB (ref 8–23)
CHLORIDE: 103 mmol/L (ref 98–111)
CO2: 24 mmol/L (ref 22–32)
Calcium: 9.1 mg/dL (ref 8.9–10.3)
Creatinine, Ser: 1.07 mg/dL (ref 0.61–1.24)
GFR calc Af Amer: >60 mL/min (ref >60)
GFR calc non Af Amer: >60 mL/min (ref >60)
GLUCOSE: 80 mg/dL (ref 70–99)
POTASSIUM: 3.6 mmol/L (ref 3.5–5.1)
SODIUM: 137 mmol/L (ref 135–145)
TOTAL PROTEIN: 6.4 g/dL — AB (ref 6.5–8.1)
Total Bilirubin: 0.5 mg/dL (ref 0.3–1.2)

## 2018-10-13 MED ORDER — ACETAMINOPHEN-CODEINE #3 300-30 MG PO TABS: 1.0000 | ORAL_TABLET | Freq: Three times a day (TID) | ORAL | 0 refills | Status: DC | PRN

## 2018-10-13 MED FILL — ACETAMINOPHEN/COD #3 TABLET: 300-30 | 6 days supply | Qty: 20 | Fill #0

## 2018-10-13 NOTE — Telephone Encounter (Signed)
Scheduled appt per 10/17 los - gave patient AVS and calender per los. - central radiology to contact patient with bone survey '

## 2018-10-14 ENCOUNTER — Other Ambulatory Visit: Payer: Self-pay | Admitting: Otolaryngology

## 2018-10-14 DIAGNOSIS — J323 Chronic sphenoidal sinusitis: Secondary | ICD-10-CM | POA: Diagnosis not present

## 2018-10-14 DIAGNOSIS — J321 Chronic frontal sinusitis: Secondary | ICD-10-CM | POA: Diagnosis not present

## 2018-10-14 DIAGNOSIS — J322 Chronic ethmoidal sinusitis: Secondary | ICD-10-CM | POA: Diagnosis not present

## 2018-10-14 DIAGNOSIS — J329 Chronic sinusitis, unspecified: Secondary | ICD-10-CM | POA: Diagnosis not present

## 2018-10-14 DIAGNOSIS — J320 Chronic maxillary sinusitis: Secondary | ICD-10-CM | POA: Diagnosis not present

## 2018-10-14 DIAGNOSIS — J324 Chronic pansinusitis: Secondary | ICD-10-CM | POA: Diagnosis not present

## 2018-10-14 LAB — KAPPA/LAMBDA LIGHT CHAINS
KAPPA FREE LGHT CHN: 28.2 mg/L — AB (ref 3.3–19.4)
Kappa, lambda light chain ratio: 1.27 (ref 0.26–1.65)
Lambda free light chains: 22.2 mg/L (ref 5.7–26.3)

## 2018-10-15 LAB — MULTIPLE MYELOMA PANEL, SERUM
ALBUMIN/GLOB SERPL: 1.1 (ref 0.7–1.7)
Albumin SerPl Elph-Mcnc: 3.1 g/dL (ref 2.9–4.4)
Alpha 1: 0.2 g/dL (ref 0.0–0.4)
Alpha2 Glob SerPl Elph-Mcnc: 1.2 g/dL — ABNORMAL HIGH (ref 0.4–1.0)
B-Globulin SerPl Elph-Mcnc: 1 g/dL (ref 0.7–1.3)
GAMMA GLOB SERPL ELPH-MCNC: 0.8 g/dL (ref 0.4–1.8)
Globulin, Total: 3.1 g/dL (ref 2.2–3.9)
IGA: 29 mg/dL — AB (ref 61–437)
IGM (IMMUNOGLOBULIN M), SRM: 57 mg/dL (ref 20–172)
IgG (Immunoglobin G), Serum: 775 mg/dL (ref 700–1600)
Total Protein ELP: 6.2 g/dL (ref 6.0–8.5)

## 2018-10-17 ENCOUNTER — Telehealth: Payer: Self-pay

## 2018-10-17 DIAGNOSIS — D751 Secondary polycythemia: Secondary | ICD-10-CM | POA: Diagnosis not present

## 2018-10-17 DIAGNOSIS — M545 Low back pain: Secondary | ICD-10-CM | POA: Diagnosis not present

## 2018-10-17 DIAGNOSIS — G8929 Other chronic pain: Secondary | ICD-10-CM | POA: Diagnosis not present

## 2018-10-17 DIAGNOSIS — E876 Hypokalemia: Secondary | ICD-10-CM | POA: Diagnosis not present

## 2018-10-17 DIAGNOSIS — Z79899 Other long term (current) drug therapy: Secondary | ICD-10-CM | POA: Diagnosis not present

## 2018-10-17 DIAGNOSIS — C9001 Multiple myeloma in remission: Secondary | ICD-10-CM | POA: Diagnosis not present

## 2018-10-17 NOTE — Telephone Encounter (Signed)
Spoke with patient per Dr. Burr Medico notified lab results, M-protein is negative, K normal, creatinine is back to normal, also he will be receiving a phone call with appointment for bone survey.  Patient verbalized an understanding.

## 2018-10-17 NOTE — Telephone Encounter (Signed)
-----   Message from Truitt Merle, MD sent at 10/15/2018  6:52 PM EDT ----- Please let pt know the lab results, M-preotein (-), K normal, Cr back to normal, no other concerns, thanks   Truitt Merle 10/15/2018

## 2018-10-17 NOTE — Telephone Encounter (Signed)
I have ordered bone survey for him to be done next week, thanks.   Truitt Merle MD

## 2018-10-17 NOTE — Telephone Encounter (Signed)
Patient calls stating that he saw Dr. Burr Medico last week and she mentioned getting an x-ray, called to get x-ray and they told him none ordered.

## 2018-10-17 NOTE — Addendum Note (Signed)
Addended by: Truitt Merle on: 10/17/2018 04:23 PM   Modules accepted: Orders

## 2018-10-18 LAB — UPEP/UIFE/LIGHT CHAINS/TP, 24-HR UR
% BETA, Urine: 23.6 %
ALPHA 1 URINE: 6.2 %
ALPHA 2 UR: 25 %
Albumin, U: 33 %
Free Kappa Lt Chains,Ur: 164 mg/L — ABNORMAL HIGH (ref 1.35–24.19)
Free Kappa/Lambda Ratio: 7.77 (ref 2.04–10.37)
Free Lambda Lt Chains,Ur: 21.1 mg/L — ABNORMAL HIGH (ref 0.24–6.66)
GAMMA GLOBULIN URINE: 12.2 %
TOTAL PROTEIN, URINE-UR/DAY: 656 mg/(24.h) — AB (ref 30–150)
Total Protein, Urine: 21.5 mg/dL

## 2018-10-21 DIAGNOSIS — J324 Chronic pansinusitis: Secondary | ICD-10-CM | POA: Diagnosis not present

## 2018-10-27 ENCOUNTER — Ambulatory Visit (HOSPITAL_COMMUNITY)
Admission: RE | Admit: 2018-10-27 | Discharge: 2018-10-27 | Disposition: A | Discharge status: Home / Self Care | Payer: Medicare Other | Source: Ambulatory Visit | Attending: Hematology | Admitting: Hematology

## 2018-10-27 DIAGNOSIS — C9001 Multiple myeloma in remission: Secondary | ICD-10-CM | POA: Insufficient documentation

## 2018-10-27 DIAGNOSIS — M4856XA Collapsed vertebra, not elsewhere classified, lumbar region, initial encounter for fracture: Secondary | ICD-10-CM | POA: Insufficient documentation

## 2018-10-27 DIAGNOSIS — M4854XA Collapsed vertebra, not elsewhere classified, thoracic region, initial encounter for fracture: Secondary | ICD-10-CM | POA: Insufficient documentation

## 2018-10-27 DIAGNOSIS — M8588 Other specified disorders of bone density and structure, other site: Secondary | ICD-10-CM | POA: Insufficient documentation

## 2018-10-28 ENCOUNTER — Telehealth: Payer: Self-pay | Admitting: Emergency Medicine

## 2018-10-28 DIAGNOSIS — J324 Chronic pansinusitis: Secondary | ICD-10-CM | POA: Diagnosis not present

## 2018-10-28 NOTE — Telephone Encounter (Addendum)
Called patient with bone survey results. Per Cira Rue, NP patient has no evidence of myeloma in the bone. Does show osteopenia. encouraged him to take Vit D + calcium supplements. States he already takes this. I explained for him to discuss this with Dr.Feng at his next visit. He verbalized understanding of this.

## 2018-11-04 ENCOUNTER — Other Ambulatory Visit: Payer: Self-pay

## 2018-11-04 ENCOUNTER — Telehealth: Payer: Self-pay

## 2018-11-04 DIAGNOSIS — C9001 Multiple myeloma in remission: Secondary | ICD-10-CM

## 2018-11-04 MED ORDER — REVLIMID 5 MG PO CAPS: 5.0000 mg | ORAL_CAPSULE | ORAL | 0 refills | Status: DC

## 2018-11-04 NOTE — Telephone Encounter (Signed)
Faxed script to [facility] for Revlimid.  Notified patient this has been taken care of.

## 2018-11-08 ENCOUNTER — Other Ambulatory Visit: Payer: Self-pay | Admitting: Hematology

## 2018-11-11 DIAGNOSIS — J324 Chronic pansinusitis: Secondary | ICD-10-CM | POA: Diagnosis not present

## 2018-12-02 ENCOUNTER — Other Ambulatory Visit: Payer: Self-pay

## 2018-12-02 DIAGNOSIS — C9001 Multiple myeloma in remission: Secondary | ICD-10-CM

## 2018-12-02 MED ORDER — REVLIMID 5 MG PO CAPS: 5.0000 mg | ORAL_CAPSULE | ORAL | 0 refills | Status: DC

## 2018-12-02 NOTE — Telephone Encounter (Signed)
Faxed script for Revlimid and [employer] authorization to [place].

## 2018-12-08 ENCOUNTER — Other Ambulatory Visit: Payer: Self-pay | Admitting: Hematology

## 2018-12-08 DIAGNOSIS — C9001 Multiple myeloma in remission: Secondary | ICD-10-CM

## 2018-12-13 NOTE — Progress Notes (Signed)
[facility]   [phone] [phone]   Clinic Follow up Note   Patient Care Team: [name], MD as PCP - General (Internal Medicine) Debara Pickett Nadean Corwin, MD as PCP - Cardiology (Cardiology) Debara Pickett Nadean Corwin, MD as Consulting Physician (Cardiology) Truitt Merle, MD as Consulting Physician (Hematology) 12/14/2018  CHIEF COMPLAINT: F/u on MM   SUMMARY OF ONCOLOGIC HISTORY: Oncology History   Cancer Staging Multiple myeloma in remission [facility]) Staging form: Multiple Myeloma, AJCC 6th Edition - Clinical: No stage assigned - Unsigned Staging comments: Stage I Prognostic indicators: Standard risk by cytogenetics       Multiple myeloma in remission ([place])   01/31/2015 Tumor Marker    SPEP showed M-SPIKE 1.7g/dl, Cr 1.5, no anemia, hypercalcemia    03/19/2015 Imaging    Bone survey showed no discrete lytic lesion, but there is osteopenia and calvarial heterogeneity.    03/27/2015 Initial Diagnosis    Multiple myeloma, stage I    03/27/2015 Bone Marrow Biopsy    Bone Marrow, Aspirate,Biopsy: - HYPERCELLULAR BONE MARROW FOR AGE WITH PLASMA CELL NEOPLASM 47% - SEE COMMENT. PERIPHERAL BLOOD: - NO SIGNIFICANT MORPHOLOGIC ABNORMALITIES. Diagnosis Note The bone marrow shows increased number of     03/27/2015 Miscellaneous    bone marrow Cytogenetics: normal. FISH panel showed the presence of +4 and +11, this is consider standard risk of MM     04/16/2015 - 04/23/2015 Hospital Admission    He was admitted for worsening back pain, was found to have a T12 pathological fracture. He underwent kyphoplasty on 04/19/2015.    04/17/2015 Imaging    MR Thoracic and Lumbar Spine w/o Contrast 04/17/2015 IMPRESSION: MR THORACIC SPINE IMPRESSION: Pathologic compression fracture at T8 in this patient with widespread multiple myeloma. Slight retropulsion of abnormal bone without conus compression. MR LUMBAR SPINE IMPRESSION: Focal myeloma deposits at L3 and T9  without significant compression deformity. Mild stenosis at L4-5 related to ordinary spondylosis.    04/19/2015 - 08/02/2015 Chemotherapy    RVD with weekly Velcade 1.3 mg/m, dexamethasone 40 mg, and Revlimid 10 mg daily on day 1-21, every 28 days, cycle 1 Revlimid was interupted by hospitalization, cycle 3 Revlimid dose increased to 25 mg daily due to his improved renal function. total 4 cyc    04/26/2015 - 05/01/2015 Hospital Admission    He was admitted to [facility] for syncope episode during his radiation simulation. EKG showed ST elevation, troponin was positive, he underwent cardio catheterization which showed mild stenosis. No intervention was needed. echo showed EF 35-40%    04/29/2015 - 05/10/2015 Radiation Therapy    palliative RT to T12, 20Gy in 10 fractions    08/19/2015 Bone Marrow Transplant    He underwent autologous stem cell transplant at [facility].     09/30/2015 Imaging    CT anginal chest 09/30/2015 IMPRESSION: 1. No pulmonary embolism. 2. No thoracic aortic aneurysm or dissection. 3. Heart size is normal. No pericardial effusion. 4. Mild scarring/atelectasis at each lung base, probably chronic. Lungs otherwise clear. No evidence of pneumonia. No pleural effusion. 5. Evidence of diffuse multiple myeloma involvement throughout the thoracolumbar spine and multiple ribs bilaterally, with areas of present clinical relevance described below. 6. Compression fracture deformity of the T12 vertebral body, known pathologic fracture, status post treatment with vertebroplasty on 04/19/2015. 7. Milder compression deformity of the overlying T11 vertebral body, approximately 40% compressed anteriorly, also presumably pathologic. This vertebral body appeared essentially normal on fluoroscopic images from the earlier aforementioned vertebroplasty  of 04/19/2015. 8. Slight irregularity of the right lateral sixth rib, with subtle cortical sclerosis suggesting nondisplaced  healing fracture. This may be a relatively acute fracture and is probably pathologic in nature related to underlying multiple myeloma. 9. Similar irregularity of the left lateral fifth rib, suggesting minimally displaced healing fracture. This may also be a relatively acute fracture and is likely pathologic in nature related to underlying multiple myeloma. These acute or subacute rib fractures are the most likely source of patient's bilateral chest pain. Additional incidental findings in the upper abdomen: Fatty infiltration of liver, cholelithiasis without evidence of acute cholecystitis, fairly large amount of stool within the nondistended colon of the upper abdomen (constipation?).    11/27/2015 Bone Marrow Biopsy    hypocellular marrow (10%), no increased plasma cells     12/29/2015 -  Chemotherapy    maintenance Revlimid 73m daily, 3 weeks on, one week off   Held for Hospital stay and restarted after recovery    04/22/2017 - 04/26/2017 Hospital Admission    Admit date: 04/22/17-04/26/17 Admission diagnosis: Shingles Herpes Zoster Additional comments: Held Revlimid during hospital stay and healing    08/31/2017 - 09/03/2017 Hospital Admission    Admit date: 08/31/17 Admission diagnosis: Pneumonia  Additional comments: [facility] and was [facility] to hospital 08/31/17 -Treated with oral Levaquin after broad-spectrum antibiotics in hospital  -Held Revlimid until next follow up*    10/27/2018 Imaging    10/27/2018 Bone Survey IMPRESSION: No definite evidence of discrete lytic lesion is seen in the skeleton.     CURRENT THERAPY Maintenance Revlimid 5 mg daily, 3 weeks on, one-week off, started on 12/29/2015.   INTERVAL HISTORY: Eric DEMELOis a 67y.o. male who is here for follow-up. Today, he is here with his wife. He is doing well, but complains of recurrent sinus infection even after his nasal surgery. He gets nasal discharge and  takes antibiotics for relief. He denies back pain, urinary symptoms, or fever. He has taken a shingles shot and denies complications. He gets intermitted nausea that is overall tolerable even though he vomited recently. He has numbness on the right lower angel of his mouth and chin with associated short lived shooting pain. He wears hearing aids.    Pertinent positives and negatives of review of systems are listed and detailed within the above HPI.  REVIEW OF SYSTEMS:   Constitutional: Denies fevers, chills or abnormal weight loss Eyes: Denies blurriness of vision Ears, nose, mouth, throat, and face: Denies mucositis or sore throat (+) recurrent sinus infections (+) hearing aids (+) numbness on the right lower corner of his mouth and chin Respiratory: Denies cough, dyspnea or wheezes Cardiovascular: Denies palpitation, chest discomfort or lower extremity swelling Gastrointestinal:  Denies heartburn or change in bowel habits (+) nausea Skin: Denies abnormal skin rashes Lymphatics: Denies new lymphadenopathy or easy bruising Neurological:Denies numbness, tingling or new weaknesses Behavioral/Psych: Mood is stable, no new changes  All other systems were reviewed with the patient and are negative.  MEDICAL HISTORY:  Past Medical History:  Diagnosis Date  . Arthritis    neck and back pain  . Cholelithiases   . Chronic kidney disease    MILD,CHRONIC  . Hyperlipidemia   . Hypertension   . Multiple myeloma ([place]   . 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 APPENDECTOMY  1962  . CARDIAC CATHETERIZATION N/A 04/30/2015  Procedure: Left Heart Cath and Coronary Angiography;  Surgeon: Peter M Martinique, MD;  Location: [place] INVASIVE CV LAB CUPID;  Service: Cardiovascular;  Laterality: N/A;  . CARDIOLITE MYOCARDIAL PERFUSION STUDY  04/16/03   NEGITIVE BRUCE PROTOCAL EXERCISE STRESS TEST. EF 70%. NO ISCHEMIA.  [name] KNEE SURGERY  2012    right  . [place]   left    I have reviewed the social history and family history with the patient and they are unchanged from previous note.  ALLERGIES:  is allergic to hydrochlorothiazide; asa [aspirin]; calcium carbonate; oxycodone; amoxicillin; and indapamide.  MEDICATIONS:  Current Outpatient Medications  Medication Sig Dispense Refill  . acetaminophen-codeine (TYLENOL #3) 300-30 MG tablet Take 1 tablet by mouth every 8 (eight) hours as needed for severe pain. 20 tablet 0  . aspirin 81 MG chewable tablet Chew 1 tablet (81 mg total) by mouth daily.    . calcium carbonate (OS-CAL) 600 MG TABS tablet Take 1,200 mg by mouth at bedtime.     . Cholecalciferol (VITAMIN D3) 1000 units CAPS Take 2,000 mg by mouth daily.    . montelukast (SINGULAIR) 10 MG tablet Take 1 tablet by mouth daily.  1  . Multiple Vitamins-Minerals (CENTRUM SILVER ULTRA MENS PO) Take 1 tablet by mouth at bedtime.     . naftifine (NAFTIN) 1 % cream Apply 1 application topically daily as needed.    . nitroGLYCERIN (NITROSTAT) 0.4 MG SL tablet Place 0.4 mg under the tongue every 5 (five) minutes x 3 doses as needed for chest pain. Reported on 05/19/2016    . ondansetron (ZOFRAN) 4 MG tablet TAKE 1 TABLET FOUR TIMES A DAY AS NEEDED FOR NAUSEA OR VOMITING 30 tablet 45  . potassium chloride (MICRO-K) 10 MEQ CR capsule TAKE 3 CAPSULES DAILY 90 capsule 12  . ranitidine (ZANTAC) 150 MG tablet Take 2 tablets (300 mg total) by mouth at bedtime. 60 tablet 1  . REVLIMID 5 MG capsule Take 1 capsule (5 mg total) by mouth as directed. Take 5 mg daily for 21 days, rest 7 days. 21 capsule 0  . simvastatin (ZOCOR) 20 MG tablet Take 20 mg by mouth daily.    . vitamin B-12 (CYANOCOBALAMIN) 500 MCG tablet Take 1 tablet by mouth daily.    [name] zolpidem (AMBIEN) 5 MG tablet Take 1 tablet (5 mg total) by mouth at bedtime as needed for sleep. 30 tablet 0   No current facility-administered medications for this visit.     PHYSICAL  EXAMINATION: ECOG PERFORMANCE STATUS: 1 - Symptomatic but completely ambulatory  Vitals:   12/14/18 1047  BP: (!) 111/56  Pulse: 68  Resp: 17  Temp: 98.5 F (36.9 C)  SpO2: 100%   Filed Weights   12/14/18 1047  Weight: 141 lb 8 oz (64.2 kg)    GENERAL:alert, no distress and comfortable SKIN: skin color, texture, turgor are normal, no rashes or significant lesions (+) bruise on skin, pumped into something this morning EYES: normal, Conjunctiva are pink and non-injected, sclera clear OROPHARYNX:no exudate, no erythema and lips, buccal mucosa, and tongue normal  NECK: supple, thyroid normal size, non-tender, without nodularity LYMPH:  no palpable lymphadenopathy in the cervical, axillary or inguinal LUNGS: clear to auscultation and percussion with normal breathing effort HEART: regular rate & rhythm and no murmurs and no lower extremity edema ABDOMEN:abdomen soft, non-tender and normal bowel sounds Musculoskeletal:no cyanosis of digits and no clubbing  NEURO: alert & oriented x 3 with fluent speech, no focal motor/sensory  deficits  LABORATORY DATA:  I have reviewed the data as listed CBC Latest Ref Rng & Units 12/14/2018 10/13/2018 06/16/2018  WBC 4.0 - 10.5 K/uL 2.1(L) 4.0 6.5  Hemoglobin 13.0 - 17.0 g/dL 16.6 17.7(H) 18.0(H)  Hematocrit 39.0 - 52.0 % 50.8 51.8 51.4(H)  Platelets 150 - 400 K/uL 219 184 188     CMP Latest Ref Rng & Units 12/14/2018 10/13/2018 06/16/2018  Glucose 70 - 99 mg/dL 87 80 126  BUN 8 - 23 mg/dL 8 7(L) 9  Creatinine 0.61 - 1.24 mg/dL 1.22 1.07 1.48(H)  Sodium 135 - 145 mmol/L 140 137 136  Potassium 3.5 - 5.1 mmol/L 4.3 3.6 2.8(LL)  Chloride 98 - 111 mmol/L 106 103 101  CO2 22 - 32 mmol/L '26 24 26  ' Calcium 8.9 - 10.3 mg/dL 10.0 9.1 9.6  Total Protein 6.5 - 8.1 g/dL 7.4 6.4(L) 6.6  Total Bilirubin 0.3 - 1.2 mg/dL 0.8 0.5 0.6  Alkaline Phos 38 - 126 U/L 139(H) 157(H) 116  AST 15 - 41 U/L 41 24 31  ALT 0 - 44 U/L 54(H) 26 64(H)   MM lab:  SPEP  M-protein (g/dl) 03/15/2015: 1.6 05/24/2015: 0.5 06/21/2015: 0.4 07/18/2015 (at [facility]): 0.33 11/27/2015: not det 01/21/2016: not det  03/24/2016: not det 05/19/2016: not det  08/07/2016: not det  10/30/2017: not det 03/18/2017: Not det 06/25/17: not det.  09/17/17: not det.  12/27/17: 0.2 03/03/18: 0.1 03/24/18: Not Observed 06/16/18: Not observed 08/26/3018: 0.24 (at [place]) 10/13/18: Not Observed   Serum IgG (mg/dl) 03/15/2015: 2000 05/24/2015: 579 06/21/2015: 673 07/18/2015 ([facility]): 509 11/27/2015: 396 01/21/2016: 487 03/24/2016: 563 05/19/2016: 600 08/07/2016: 530 10/30/2016: 589 03/18/2017: 584 06/25/17: 645 09/17/17: 542 12/27/17: 705 03/03/18: 660 03/24/18: 774 06/16/18:  620 10/13/18: 775  Serum kappa/lamda/ratio (mg/dl)  03/15/2015: 690, 0.13, 5308 05/24/2015: 146, 1.27, 115.0 06/21/2015: 86, 0.74, 116.2 01/21/2016: 1.09, 1.21, 0.9 03/24/2016: 1.72, 1.49, 1.16 05/19/2016: 1.91, 1.50, 1.27 08/07/2016: 1.36, 1.41, 0.96  10/30/2016: 1.6, 1.32, 1.21  03/18/2017: 1.67, 1.55, 1.08 06/25/17: 1.51, 1.46, 1.03 09/17/17: 1.14, 1.17, 0.97 12/27/17: 1.74, 1.90, 0.92 03/24/18: 2.54, 1.73, 1.47 06/16/18: 15.3, 10.8, 1.42 10/13/18: 2.82, 2.22, 1.27   24 h urine kappa light chain (mg) 03/18/2015: 2434 05/28/2015: 582 06/24/2015: 322 04/27/2016: 499  03/28/2018: 231 10/17/18: 164   RADIOGRAPHIC STUDIES: I have personally reviewed the radiological images as listed and agreed with the findings in the report. No results found.   10/27/2018 Bone Survey IMPRESSION: No definite evidence of discrete lytic lesion is seen in the skeleton.  There is interval development of several old left rib fractures as well as compression fractures involving the thoracic and lumbar spine as described above. These appear to be old, but potentially may have been due to myeloma.  Osteopenia of the lumbar spine and pelvis is noted which is more prominent compared to prior exam, with stable mottled appearance  of the skull compared to prior exam. These are nonspecific findings, but potentially   ASSESSMENT & PLAN:  Eric Dudley is a 67 y.o. male with history of  1. IgG multiple myeloma, stage I, standard risk by cytogenetics (+4 and +11), in remission  -Diagnosed in 2016. Treated with induction chemo, and autologous bone marrow transplant at [facility]. -Currently on maintenance Revlimid 5 mg daily, 3 weeks on, one-week off, started on 12/29/2015. He is tolerating well  -Labs reviewed, CBC showed Hg 16.6 WBC 2.1K. CMP pending.  -His M protein has remained negative since her transplant, IgG was in normal range, light  chain ration normal, he remains to be in remission  -I informed him that Revlimid can lower his immunity and therefore contribute to his recurrent sinus infections. -He is clinically stable and doing well, complains of occasional mild nausea. -Continue Revlimid.  -f/u in 3 months with labs   2. Disseminated Shingles in 03/2017 -He had a shingles shot this year.  -f/u with PCP  3. T12 pathologic compression fracture, and diffuse myeloma involvement in bones -I discussed and reviewed his bone survey results. No evidence of lytic lesions.  4. 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 Debara Pickett  5.  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 improved   6. Right Jaw necrosis -Improving with intermittent numbness. -He still has intermitted numbness with shooting pain on the right lower corner of his mouth and chin. Will monitor.  -f/u with his dentist   7. Hypokalemia  -Currently on Potassium pills 10 mg 4 times a day. Continue. -K normal today, 4.3, will decrease his oral KCL to 43mq daily and repeat lab in 3 weeks   8. Chronic back pain, Left leg tenderness, jaw pain  -I previously prescribed Tylenol #3 to be used a needed for severe pain.   9.  Recurrent sinus  infection -f/u with ENT. -He still has recurrent sinus infections that require antibiotics.  -I informed him that Revlimid lowes immunity and can contribute.  Plan  -f/u in 3 months with labs -decrease KCL from 438m to 2082mdaily, repeat lab in 3 weeks  -I refilled Revlimid   No problem-specific Assessment & Plan notes found for this encounter.   No orders of the defined types were placed in this encounter.  All questions were answered. The patient knows to call the clinic with any problems, questions or concerns. No barriers to learning was detected. I spent 20 minutes counseling the patient face to face. The total time spent in the appointment was 25 minutes and more than 50% was on counseling and review of test results  I, Noor Dweik am acting as scribe for Dr. YanTruitt MerleI have reviewed the above documentation for accuracy and completeness, and I agree with the above.     YanTruitt MerleD 12/14/2018

## 2018-12-14 ENCOUNTER — Telehealth: Payer: Self-pay | Admitting: Hematology

## 2018-12-14 ENCOUNTER — Encounter: Payer: Self-pay | Admitting: Hematology

## 2018-12-14 ENCOUNTER — Inpatient Hospital Stay: Payer: Medicare Other

## 2018-12-14 ENCOUNTER — Inpatient Hospital Stay: Payer: Medicare Other | Attending: Hematology | Admitting: Hematology

## 2018-12-14 VITALS — BP 111/56 | HR 68 | Temp 98.5°F | Resp 17 | Ht 69.0 in | Wt 141.5 lb

## 2018-12-14 DIAGNOSIS — I2510 Atherosclerotic heart disease of native coronary artery without angina pectoris: Secondary | ICD-10-CM | POA: Insufficient documentation

## 2018-12-14 DIAGNOSIS — M545 Low back pain: Secondary | ICD-10-CM | POA: Diagnosis not present

## 2018-12-14 DIAGNOSIS — C9001 Multiple myeloma in remission: Secondary | ICD-10-CM

## 2018-12-14 DIAGNOSIS — G8929 Other chronic pain: Secondary | ICD-10-CM | POA: Insufficient documentation

## 2018-12-14 DIAGNOSIS — D751 Secondary polycythemia: Secondary | ICD-10-CM | POA: Insufficient documentation

## 2018-12-14 DIAGNOSIS — J328 Other chronic sinusitis: Secondary | ICD-10-CM

## 2018-12-14 DIAGNOSIS — M272 Inflammatory conditions of jaws: Secondary | ICD-10-CM | POA: Diagnosis not present

## 2018-12-14 DIAGNOSIS — N182 Chronic kidney disease, stage 2 (mild): Secondary | ICD-10-CM

## 2018-12-14 DIAGNOSIS — E876 Hypokalemia: Secondary | ICD-10-CM

## 2018-12-14 DIAGNOSIS — I428 Other cardiomyopathies: Secondary | ICD-10-CM | POA: Diagnosis not present

## 2018-12-14 DIAGNOSIS — R110 Nausea: Secondary | ICD-10-CM

## 2018-12-14 DIAGNOSIS — I10 Essential (primary) hypertension: Secondary | ICD-10-CM

## 2018-12-14 LAB — CBC WITH DIFFERENTIAL/PLATELET
Abs Immature Granulocytes: 0.01 10*3/uL (ref 0.00–0.07)
BASOS ABS: 0 10*3/uL (ref 0.0–0.1)
Basophils Relative: 0 %
EOS ABS: 0.1 10*3/uL (ref 0.0–0.5)
Eosinophils Relative: 6 %
HCT: 50.8 % (ref 39.0–52.0)
Hemoglobin: 16.6 g/dL (ref 13.0–17.0)
IMMATURE GRANULOCYTES: 1 %
Lymphocytes Relative: 22 %
Lymphs Abs: 0.5 10*3/uL — ABNORMAL LOW (ref 0.7–4.0)
MCH: 33.7 pg (ref 26.0–34.0)
MCHC: 32.7 g/dL (ref 30.0–36.0)
MCV: 103 fL — ABNORMAL HIGH (ref 80.0–100.0)
Monocytes Absolute: 0.4 10*3/uL (ref 0.1–1.0)
Monocytes Relative: 17 %
Neutro Abs: 1.2 10*3/uL — ABNORMAL LOW (ref 1.7–7.7)
Neutrophils Relative %: 54 %
Platelets: 219 10*3/uL (ref 150–400)
RBC: 4.93 MIL/uL (ref 4.22–5.81)
RDW: 15 % (ref 11.5–15.5)
WBC: 2.1 10*3/uL — ABNORMAL LOW (ref 4.0–10.5)
nRBC: 0 % (ref 0.0–0.2)

## 2018-12-14 LAB — COMPREHENSIVE METABOLIC PANEL
ALBUMIN: 3.8 g/dL (ref 3.5–5.0)
ALT: 54 U/L — ABNORMAL HIGH (ref 0–44)
ANION GAP: 8 (ref 5–15)
AST: 41 U/L (ref 15–41)
Alkaline Phosphatase: 139 U/L — ABNORMAL HIGH (ref 38–126)
BUN: 8 mg/dL (ref 8–23)
CHLORIDE: 106 mmol/L (ref 98–111)
CO2: 26 mmol/L (ref 22–32)
Calcium: 10 mg/dL (ref 8.9–10.3)
Creatinine, Ser: 1.22 mg/dL (ref 0.61–1.24)
GFR calc Af Amer: >60 mL/min (ref >60)
GFR calc non Af Amer: >60 mL/min (ref >60)
GLUCOSE: 87 mg/dL (ref 70–99)
POTASSIUM: 4.3 mmol/L (ref 3.5–5.1)
SODIUM: 140 mmol/L (ref 135–145)
Total Bilirubin: 0.8 mg/dL (ref 0.3–1.2)
Total Protein: 7.4 g/dL (ref 6.5–8.1)

## 2018-12-14 MED ORDER — REVLIMID 5 MG PO CAPS: 5.0000 mg | ORAL_CAPSULE | ORAL | 0 refills | Status: DC

## 2018-12-14 NOTE — Telephone Encounter (Signed)
Printed calendar and avs. °

## 2018-12-15 ENCOUNTER — Telehealth: Payer: Self-pay | Admitting: Hematology

## 2018-12-15 ENCOUNTER — Telehealth: Payer: Self-pay

## 2018-12-15 NOTE — Telephone Encounter (Signed)
Spoke with patient regarding lab results, per Dr. Burr Medico, informed him much better than before, okay to reduce KCL from 40 meq to 20 meq (2 tabs) daily, repeat lab in 3 weeks. Patient verbalized an understanding.

## 2018-12-15 NOTE — Telephone Encounter (Signed)
-----   Message from Truitt Merle, MD sent at 12/14/2018  9:12 PM EST ----- Please let pt know his K was 4.3 today, much better than before, OK to reduce KCL from 33meq to 63meq (2 tab) daily, and I will schedule lab in 3 weeks, thanks   Truitt Merle  12/14/2018

## 2018-12-15 NOTE — Telephone Encounter (Signed)
Scheduled appt per 12/18 sch message - pt is aware of appt date and time   

## 2018-12-30 ENCOUNTER — Other Ambulatory Visit: Payer: Self-pay

## 2018-12-30 DIAGNOSIS — C9001 Multiple myeloma in remission: Secondary | ICD-10-CM

## 2018-12-30 MED ORDER — REVLIMID 5 MG PO CAPS: 5.0000 mg | ORAL_CAPSULE | ORAL | 0 refills | Status: DC

## 2019-01-02 ENCOUNTER — Telehealth: Payer: Self-pay

## 2019-01-02 NOTE — Telephone Encounter (Signed)
Faxed script and [employer] Authorization to [place] fax [phone]

## 2019-01-05 ENCOUNTER — Inpatient Hospital Stay: Payer: Medicare Other | Attending: Hematology

## 2019-01-05 DIAGNOSIS — C9001 Multiple myeloma in remission: Secondary | ICD-10-CM | POA: Diagnosis not present

## 2019-01-05 LAB — COMPREHENSIVE METABOLIC PANEL
ALT: 46 U/L — ABNORMAL HIGH (ref 0–44)
AST: 33 U/L (ref 15–41)
Albumin: 3.8 g/dL (ref 3.5–5.0)
Alkaline Phosphatase: 113 U/L (ref 38–126)
Anion gap: 7 (ref 5–15)
BUN: 5 mg/dL — AB (ref 8–23)
CO2: 26 mmol/L (ref 22–32)
Calcium: 9.3 mg/dL (ref 8.9–10.3)
Chloride: 106 mmol/L (ref 98–111)
Creatinine, Ser: 1.37 mg/dL — ABNORMAL HIGH (ref 0.61–1.24)
GFR calc Af Amer: >60 mL/min (ref >60)
GFR calc non Af Amer: 52 mL/min — ABNORMAL LOW (ref >60)
Glucose, Bld: 94 mg/dL (ref 70–99)
POTASSIUM: 3.7 mmol/L (ref 3.5–5.1)
SODIUM: 139 mmol/L (ref 135–145)
Total Bilirubin: 0.5 mg/dL (ref 0.3–1.2)
Total Protein: 6.8 g/dL (ref 6.5–8.1)

## 2019-01-05 LAB — CBC WITH DIFFERENTIAL/PLATELET
Abs Immature Granulocytes: 0.01 10*3/uL (ref 0.00–0.07)
Basophils Absolute: 0 10*3/uL (ref 0.0–0.1)
Basophils Relative: 1 %
Eosinophils Absolute: 0.1 10*3/uL (ref 0.0–0.5)
Eosinophils Relative: 1 %
HCT: 47.9 % (ref 39.0–52.0)
Hemoglobin: 15.4 g/dL (ref 13.0–17.0)
Immature Granulocytes: 0 %
LYMPHS ABS: 0.8 10*3/uL (ref 0.7–4.0)
Lymphocytes Relative: 19 %
MCH: 33.7 pg (ref 26.0–34.0)
MCHC: 32.2 g/dL (ref 30.0–36.0)
MCV: 104.8 fL — ABNORMAL HIGH (ref 80.0–100.0)
Monocytes Absolute: 0.8 10*3/uL (ref 0.1–1.0)
Monocytes Relative: 18 %
Neutro Abs: 2.6 10*3/uL (ref 1.7–7.7)
Neutrophils Relative %: 61 %
Platelets: 204 10*3/uL (ref 150–400)
RBC: 4.57 MIL/uL (ref 4.22–5.81)
RDW: 14.6 % (ref 11.5–15.5)
WBC: 4.3 10*3/uL (ref 4.0–10.5)
nRBC: 0 % (ref 0.0–0.2)

## 2019-01-06 LAB — KAPPA/LAMBDA LIGHT CHAINS
KAPPA FREE LGHT CHN: 17.8 mg/L (ref 3.3–19.4)
Kappa, lambda light chain ratio: 0.96 (ref 0.26–1.65)
LAMDA FREE LIGHT CHAINS: 18.6 mg/L (ref 5.7–26.3)

## 2019-01-09 DIAGNOSIS — E782 Mixed hyperlipidemia: Secondary | ICD-10-CM | POA: Diagnosis not present

## 2019-01-09 DIAGNOSIS — E876 Hypokalemia: Secondary | ICD-10-CM | POA: Diagnosis not present

## 2019-01-09 LAB — MULTIPLE MYELOMA PANEL, SERUM
ALPHA 1: 0.1 g/dL (ref 0.0–0.4)
Albumin SerPl Elph-Mcnc: 3.9 g/dL (ref 2.9–4.4)
Albumin/Glob SerPl: 1.6 (ref 0.7–1.7)
Alpha2 Glob SerPl Elph-Mcnc: 0.9 g/dL (ref 0.4–1.0)
B-Globulin SerPl Elph-Mcnc: 0.9 g/dL (ref 0.7–1.3)
Gamma Glob SerPl Elph-Mcnc: 0.6 g/dL (ref 0.4–1.8)
Globulin, Total: 2.5 g/dL (ref 2.2–3.9)
IgA: 28 mg/dL — ABNORMAL LOW (ref 61–437)
IgG (Immunoglobin G), Serum: 751 mg/dL (ref 700–1600)
IgM (Immunoglobulin M), Srm: 23 mg/dL (ref 20–172)
M Protein SerPl Elph-Mcnc: 0.2 g/dL — ABNORMAL HIGH
Total Protein ELP: 6.4 g/dL (ref 6.0–8.5)

## 2019-01-10 ENCOUNTER — Ambulatory Visit (INDEPENDENT_AMBULATORY_CARE_PROVIDER_SITE_OTHER): Payer: Medicare Other | Admitting: Internal Medicine

## 2019-01-10 ENCOUNTER — Encounter: Payer: Self-pay | Admitting: Internal Medicine

## 2019-01-10 ENCOUNTER — Telehealth: Payer: Self-pay

## 2019-01-10 VITALS — BP 134/74 | HR 57 | Ht 67.0 in | Wt 137.0 lb

## 2019-01-10 DIAGNOSIS — I2510 Atherosclerotic heart disease of native coronary artery without angina pectoris: Secondary | ICD-10-CM

## 2019-01-10 DIAGNOSIS — E785 Hyperlipidemia, unspecified: Secondary | ICD-10-CM

## 2019-01-10 DIAGNOSIS — I428 Other cardiomyopathies: Secondary | ICD-10-CM | POA: Diagnosis not present

## 2019-01-10 DIAGNOSIS — I10 Essential (primary) hypertension: Secondary | ICD-10-CM

## 2019-01-10 MED ORDER — LOSARTAN POTASSIUM 25 MG PO TABS: 25.0000 mg | ORAL_TABLET | Freq: Every day | ORAL | 3 refills | Status: DC

## 2019-01-10 NOTE — Telephone Encounter (Signed)
Spoke with patient regarding recent lab results.  Per Dr. Burr Medico notified him M-protein was positive but at a low level, creatinine is slight worse.  Instructed him to drink plenty of fluids at least 6 to 8 glasses of water daily.  He agreed to move his lab appointment up to a week before his next office visit on 3/18.  Scheduled him for lab on 3/11.  Patient verbalized an understanding.

## 2019-01-10 NOTE — Progress Notes (Signed)
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 He has been on Vytorin 10/40 mg. Recently, a lipid NMR demonstrated a high particle number at 1323 with an LDL cholesterol of 67, indicating increased risk. He also underwent a [place] heart protocol per your request, which showed an elevated lipoprotein(a) at 155 and elevated insulin level, suggesting early insulin resistance. Hemoglobin was 5.8. APOB levels were mildly elevated as well as APOA1 fractions. However, overall cholesterol control is fairly good. In the past, I had recommended changing him to Crestor; however, apparently he was intolerant to this in the past as well as Lipitor and another statin, which he does not recall. He is on low-dose Diovan for hypertension and had been on Norvasc in the past. The Diovan was apparently started by Dr. Einar Gip prior to my caring for him and I feel that this is a reasonable medicine for blood pressure control. Symptomatically, he denies any chest pain, shortness of breath, palpitations, presyncope, or syncopal symptoms.   I the pleasure see Eric Dudley back in the office today. He was recently hospitalized after presenting with a syncopal event. He was admitted on 4/19 for acute back pain where he was found to have a pathological T12 fracture IR consulted and recommended kyphoplasty which was performed on 04/19/15.Patient came to his regular scheduled injection of Velcade at the cancer center 4/29 and was being mapped for XRT and while being transferred from chair to table he was dropped down below usual 30. Subsequent to this he had an episode wherein he had severe pain and became unresponsive for about 20 seconds. EKG demonstrated anteroapical ST  elevation and he had car catheterization on 04/30/2015. This demonstratedProx RCA lesion, 30% stenosed. Ost LM to LM lesion, 30% stenosed, normal LVEDP. Echocardiography, however demonstrated reduced LV function with an EF of 35-40% and a large anteroapical aneurysm. The etiology of this aneurysm is not quite clear but thought to be related to prior radiation therapy and or chemotherapy for tonsillar cancer. He did suffer an NSTEMI, with troponin elevation to greater than 4. He was placed on aspirin and appropriately Plavix. There was some question as to whether he needed to continue this, however based on the CURE trial data, there is indication for therapy at least one year even in patients who did not have prior stenting. There is also new are data using Brilinta with similar if not better outcome data. Currently he reports some fatigue and had an episode yesterday where he vomited number of times and is somewhat weak today. Blood pressure is low normal.  Eric Dudley returns today for follow-up. He has been diagnosed with multiple myeloma and recently underwent bone marrow transplant. During that time he had congestive heart failure exacerbation. Prior to transplant his EF has improved and in fact a repeat echocardiogram performed on 9 7 2016 demonstrated an EF of 65-70%.  He's not currently on heart failure medication other than low-dose metoprolol XL 12.5 mg daily due to hypotension.  He is also on low-dose aspirin 81 mg and Plavix 75 mg.   As a reminder, there is no significant obstructive disease by cath in April and the dual antiplatelets therapy is for non-STEMI..  Eric Dudley was seen back in the office  today for follow-up. He continues to have symptoms of lower orthostatic hypotension. Orthostatic vitals today indicated a drop of blood pressure by 30 mmHg. He's also noted to be tachycardic. He was dizzy with change in position. As last office visit I stopped his Aldactone which he says has not made a  significant difference in his symptoms. He is on low-dose beta blocker but remains tachycardic. I suspect this may be related to either multiple myeloma or his recent stem cell transplant. He recently had repeat echocardiogram as described above which showed improvement in LV function.  Mr. Birman reports a marked improvement in his orthostatic symptoms on midodrine. We have discontinued all of blood pressure medicines however for some reason he received valsartan in the mail. Fortunately he did not take this medicine. In generally very pleased with the midodrine and its improvement in his blood pressures. He is now able to do most activities and seems to be improving. The etiology of his low blood pressures is not quite clear. I would like to check a repeat limited echo to look for changes in LV function.  05/27/2016  Eric Dudley returns today for follow-up. He is doing exceedingly well after his bone marrow transplant. He is no longer struggling with hypotension and is come off of midodrine. Blood pressure is normal today with a heart rate in the upper 50s. He is not back on any blood pressure medications. As previously mentioned his LVEF has normalized. He reports some mild shortness of breath but that continues to improve day by day. His strength is also getting better.  12/08/2016  Eric Dudley was seen today in follow-up. Overall he is doing exceedingly well. As recently reported his LVEF has normalized on echo. He has since been very active including going flyfishing and dear hunting in [place]. He is no longer having problems with hypotension. Blood pressure has normalized and heart rate remains in the upper 50s. This is despite any medications for heart failure and I suspect he may have had stress-induced cardiomyopathy.  01/04/2018  Eric Dudley returns today for follow-up.  I last saw him about a year ago.  Over the past year he said he was stricken with shingles in the early spring and then several  months later had a pneumonia, not long after getting revaccinated.  He was hospitalized and treated for that.  Otherwise has been doing well.  He recently got back from a hunting trip in [place].  He denies any chest pain or worsening shortness of breath.  He recently had a repeat echo which showed stable LVEF of 60-65% and grade 2 diastolic dysfunction.  Again he is asymptomatic.  The pressure initially was elevated today however came down with a recheck to 118/70.  Cholesterol is followed by his PCP and he reported his labs have been well controlled.  We did obtain those laboratory values indicating an LDL of 184 and total cholesterol 280 as of 05/31/2017.  This is apparently on simvastatin.  Even without a history of coronary artery disease his goal LDL would be less than 100 or 50% reduction from baseline.  01/10/2019  Eric Dudley seen today in follow-up.  Is been a year since I last saw him.  He has been undergoing treatment for multiple myeloma.  Recently he had mildly positive findings.  He has had a prior bone marrow transplant.  He denies chest pain or worsening shortness of breath.  He has been on simvastatin for dyslipidemia with some improvement in his lipid  profile and an LDL less than 100.  PMHx:  Past Medical History:  Diagnosis Date  . Arthritis    neck and back pain  . Cholelithiases   . Chronic kidney disease    MILD,CHRONIC  . Hyperlipidemia   . Hypertension   . Multiple myeloma ([place])   . Tonsillar cancer ([place]) 2006    Past Surgical History:  Procedure Laterality Date  . APPENDECTOMY  1962  . CARDIAC CATHETERIZATION N/A 04/30/2015   Procedure: Left Heart Cath and Coronary Angiography;  Surgeon: Peter M Martinique, MD;  Location: [facility] INVASIVE CV LAB CUPID;  Service: Cardiovascular;  Laterality: N/A;  . CARDIOLITE MYOCARDIAL PERFUSION STUDY  04/16/03   NEGITIVE BRUCE PROTOCAL EXERCISE STRESS TEST. EF 70%. NO ISCHEMIA.  [name] KNEE SURGERY  2012   right  . SHOULDER SURGERY  1999   left     FAMHx:  Family History  Problem Relation Age of Onset  . Cancer Mother 3       cervical cancer   . Cancer Father        unknown cancer   . Hypertension Sister   . Cancer Sister        melanoma     SOCHx:   reports that he quit smoking about 15 years ago. His smoking use included cigars. He quit smokeless tobacco use about 24 years ago.  His smokeless tobacco use included chew. He reports that he does not drink alcohol or use drugs.  ALLERGIES:  Allergies  Allergen Reactions  . Hydrochlorothiazide Rash and Palpitations  . Asa [Aspirin] Nausea Only  . Calcium Carbonate Nausea And Vomiting  . Oxycodone Other (See Comments)    Patient does not want to take. Severe Hallucinations.  . Amoxicillin Itching and Rash  . Indapamide Palpitations    Lightheadedness, dizziness    ROS: Pertinent items noted in HPI and remainder of comprehensive ROS otherwise negative.  HOME MEDS: Current Outpatient Medications  Medication Sig Dispense Refill  . acetaminophen-codeine (TYLENOL #3) 300-30 MG tablet Take 1 tablet by mouth every 8 (eight) hours as needed for severe pain. 20 tablet 0  . aspirin 81 MG chewable tablet Chew 1 tablet (81 mg total) by mouth daily.    . calcium carbonate (OS-CAL) 600 MG TABS tablet Take 1,200 mg by mouth at bedtime.     . Cholecalciferol (VITAMIN D3) 1000 units CAPS Take 2,000 mg by mouth daily.    . Multiple Vitamins-Minerals (CENTRUM SILVER ULTRA MENS PO) Take 1 tablet by mouth at bedtime.     . naftifine (NAFTIN) 1 % cream Apply 1 application topically daily as needed.    . nitroGLYCERIN (NITROSTAT) 0.4 MG SL tablet Place 0.4 mg under the tongue every 5 (five) minutes x 3 doses as needed for chest pain. Reported on 05/19/2016    . ondansetron (ZOFRAN) 4 MG tablet TAKE 1 TABLET FOUR TIMES A DAY AS NEEDED FOR NAUSEA OR VOMITING 30 tablet 45  . potassium chloride (MICRO-K) 10 MEQ CR capsule TAKE 3 CAPSULES DAILY 90 capsule 12  . ranitidine (ZANTAC) 150 MG  tablet Take 2 tablets (300 mg total) by mouth at bedtime. 60 tablet 1  . REVLIMID 5 MG capsule Take 1 capsule (5 mg total) by mouth as directed. Take 5 mg daily for 21 days, rest 7 days. 21 capsule 0  . simvastatin (ZOCOR) 20 MG tablet Take 20 mg by mouth daily.    . vitamin B-12 (CYANOCOBALAMIN) 500 MCG tablet Take 1 tablet by mouth daily.    [name]  zolpidem (AMBIEN) 5 MG tablet Take 1 tablet (5 mg total) by mouth at bedtime as needed for sleep. 30 tablet 0   No current facility-administered medications for this visit.     LABS/IMAGING: No results found for this or any previous visit (from the past 48 hour(s)). No results found.  VITALS: BP 134/74 (BP Location: Left Arm, Patient Position: Sitting, Cuff Size: Normal)   Pulse (!) 57   Ht '5\' 7"'  (1.702 m)   Wt 137 lb (62.1 kg)   BMI 21.46 kg/m   EXAM: General appearance: alert and no distress Neck: no carotid bruit, no JVD and thyroid not enlarged, symmetric, no tenderness/mass/nodules Lungs: clear to auscultation bilaterally Heart: regular rate and rhythm, S1, S2 normal, no murmur, click, rub or gallop Abdomen: soft, non-tender; bowel sounds normal; no masses,  no organomegaly Extremities: extremities normal, atraumatic, no cyanosis or edema Pulses: 2+ and symmetric Skin: Skin color, texture, turgor normal. No rashes or lesions Neurologic: Grossly normal Psych: Pleasant  EKG:  Sinus bradycardia with PACs at 52-personally reviewed  ASSESSMENT: 1. Nonischemic cardiomyopathy, EF 35-40% - improved to 65-70% by echo at [facility] on 09/04/15 and 60 to 65% by echo in December 2018  2. 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 Hypertension 4. Dyslipidemia 5. History of tonsillar cancer 6. Multiple myeloma-s/p BMT 7. CKD2 8. Orthostatic hypotension - resolved, off of midodrine  PLAN: 1.   Mr. Matus denies any chest pain or worsening shortness of breath.  His last 2 echoes have been stable showing normal systolic  function.  Blood pressure is well controlled.  He is cholesterol could be slightly better controlled.  Will evaluate a repeat lipid profile he had drawn by his PCP earlier this week.  He is not struggling with any orthostatic hypotension at this time.  He recently was cleared for surgery by 1 of my colleagues and underwent successful nasal septal surgery.  Follow-up with me annually or sooner as necessary.  Pixie Casino, MD, [facility], [place] Director of the Advanced Lipid Disorders &  Cardiovascular Risk Reduction Clinic Diplomate of the American Board of Clinical Lipidology Attending Cardiologist  Direct Dial: [phone]  Fax: [phone]  Website:  [email or web address] Gaffey Chaeli Judy 01/10/2019, 2:44 PM

## 2019-01-10 NOTE — Telephone Encounter (Signed)
-----   Message from Truitt Merle, MD sent at 01/09/2019  8:19 PM EST ----- Please let pt know his lab results, M-protein was positive but at low level, Cr slightly worse, make sure he drinks adequately. Will continue monitoring, if he agrees, please move his lab to one week before next OV, so I can discuss his results when I see him in 2 months, thanks  Truitt Merle  01/09/2019

## 2019-01-10 NOTE — Patient Instructions (Addendum)
Medication Instructions:  START losartan 25mg  daily If you need a refill on your cardiac medications before your next appointment, please call your pharmacy.   Follow-Up: At [facility], you and your health needs are our priority.  As part of our continuing mission to provide you with exceptional heart care, we have created designated Provider Care Teams.  These Care Teams include your primary Cardiologist (physician) and Advanced Practice Providers (APPs -  Physician Assistants and Nurse Practitioners) who all work together to provide you with the care you need, when you need it. You will need a follow up appointment in 12 months.  Please call our office 2 months in advance to schedule this appointment.  You may see Pixie Casino, MD or one of the following Advanced Practice Providers on your designated Care Team: [place], [place] . Fabian Sharp, PA-C  Any Other Special Instructions Will Be Listed Below (If Applicable).

## 2019-01-12 ENCOUNTER — Other Ambulatory Visit: Payer: Self-pay | Admitting: *Deleted

## 2019-01-12 MED ORDER — LOSARTAN POTASSIUM 25 MG PO TABS: 25.0000 mg | ORAL_TABLET | Freq: Every day | ORAL | 3 refills | Status: AC

## 2019-01-16 DIAGNOSIS — I129 Hypertensive chronic kidney disease with stage 1 through stage 4 chronic kidney disease, or unspecified chronic kidney disease: Secondary | ICD-10-CM | POA: Diagnosis not present

## 2019-01-16 DIAGNOSIS — E782 Mixed hyperlipidemia: Secondary | ICD-10-CM | POA: Diagnosis not present

## 2019-01-16 DIAGNOSIS — N183 Chronic kidney disease, stage 3 (moderate): Secondary | ICD-10-CM | POA: Diagnosis not present

## 2019-01-16 DIAGNOSIS — I429 Cardiomyopathy, unspecified: Secondary | ICD-10-CM | POA: Diagnosis not present

## 2019-01-16 DIAGNOSIS — J22 Unspecified acute lower respiratory infection: Secondary | ICD-10-CM | POA: Diagnosis not present

## 2019-01-23 ENCOUNTER — Emergency Department (HOSPITAL_COMMUNITY): Payer: Medicare Other

## 2019-01-23 ENCOUNTER — Encounter (HOSPITAL_COMMUNITY): Payer: Self-pay | Admitting: Emergency Medicine

## 2019-01-23 ENCOUNTER — Inpatient Hospital Stay (HOSPITAL_COMMUNITY)
Admission: EM | Admit: 2019-01-23 | Discharge: 2019-02-26 | DRG: 870 | Disposition: E | Payer: Medicare Other | Attending: Internal Medicine | Admitting: Internal Medicine

## 2019-01-23 ENCOUNTER — Inpatient Hospital Stay (HOSPITAL_COMMUNITY): Payer: Medicare Other

## 2019-01-23 DIAGNOSIS — I959 Hypotension, unspecified: Secondary | ICD-10-CM | POA: Diagnosis not present

## 2019-01-23 DIAGNOSIS — W19XXXA Unspecified fall, initial encounter: Secondary | ICD-10-CM | POA: Diagnosis present

## 2019-01-23 DIAGNOSIS — Z9481 Bone marrow transplant status: Secondary | ICD-10-CM

## 2019-01-23 DIAGNOSIS — C9001 Multiple myeloma in remission: Secondary | ICD-10-CM | POA: Diagnosis present

## 2019-01-23 DIAGNOSIS — E878 Other disorders of electrolyte and fluid balance, not elsewhere classified: Secondary | ICD-10-CM | POA: Diagnosis not present

## 2019-01-23 DIAGNOSIS — E162 Hypoglycemia, unspecified: Secondary | ICD-10-CM | POA: Diagnosis not present

## 2019-01-23 DIAGNOSIS — Z886 Allergy status to analgesic agent status: Secondary | ICD-10-CM

## 2019-01-23 DIAGNOSIS — E872 Acidosis: Secondary | ICD-10-CM | POA: Diagnosis present

## 2019-01-23 DIAGNOSIS — R739 Hyperglycemia, unspecified: Secondary | ICD-10-CM | POA: Diagnosis present

## 2019-01-23 DIAGNOSIS — R740 Nonspecific elevation of levels of transaminase and lactic acid dehydrogenase [LDH]: Secondary | ICD-10-CM | POA: Diagnosis present

## 2019-01-23 DIAGNOSIS — R079 Chest pain, unspecified: Secondary | ICD-10-CM

## 2019-01-23 DIAGNOSIS — Z8679 Personal history of other diseases of the circulatory system: Secondary | ICD-10-CM

## 2019-01-23 DIAGNOSIS — Z7982 Long term (current) use of aspirin: Secondary | ICD-10-CM

## 2019-01-23 DIAGNOSIS — Z4682 Encounter for fitting and adjustment of non-vascular catheter: Secondary | ICD-10-CM | POA: Diagnosis not present

## 2019-01-23 DIAGNOSIS — R52 Pain, unspecified: Secondary | ICD-10-CM | POA: Diagnosis not present

## 2019-01-23 DIAGNOSIS — Z4659 Encounter for fitting and adjustment of other gastrointestinal appliance and device: Secondary | ICD-10-CM

## 2019-01-23 DIAGNOSIS — N182 Chronic kidney disease, stage 2 (mild): Secondary | ICD-10-CM | POA: Diagnosis present

## 2019-01-23 DIAGNOSIS — Z87891 Personal history of nicotine dependence: Secondary | ICD-10-CM

## 2019-01-23 DIAGNOSIS — D61818 Other pancytopenia: Secondary | ICD-10-CM | POA: Diagnosis not present

## 2019-01-23 DIAGNOSIS — Z9911 Dependence on respirator [ventilator] status: Secondary | ICD-10-CM | POA: Diagnosis not present

## 2019-01-23 DIAGNOSIS — L8990 Pressure ulcer of unspecified site, unspecified stage: Secondary | ICD-10-CM

## 2019-01-23 DIAGNOSIS — Z515 Encounter for palliative care: Secondary | ICD-10-CM | POA: Diagnosis not present

## 2019-01-23 DIAGNOSIS — E43 Unspecified severe protein-calorie malnutrition: Secondary | ICD-10-CM

## 2019-01-23 DIAGNOSIS — L89896 Pressure-induced deep tissue damage of other site: Secondary | ICD-10-CM | POA: Diagnosis not present

## 2019-01-23 DIAGNOSIS — I253 Aneurysm of heart: Secondary | ICD-10-CM | POA: Diagnosis present

## 2019-01-23 DIAGNOSIS — I491 Atrial premature depolarization: Secondary | ICD-10-CM | POA: Diagnosis present

## 2019-01-23 DIAGNOSIS — Z6820 Body mass index (BMI) 20.0-20.9, adult: Secondary | ICD-10-CM

## 2019-01-23 DIAGNOSIS — N132 Hydronephrosis with renal and ureteral calculous obstruction: Secondary | ICD-10-CM | POA: Diagnosis present

## 2019-01-23 DIAGNOSIS — M5489 Other dorsalgia: Secondary | ICD-10-CM | POA: Diagnosis not present

## 2019-01-23 DIAGNOSIS — R6521 Severe sepsis with septic shock: Secondary | ICD-10-CM

## 2019-01-23 DIAGNOSIS — Z885 Allergy status to narcotic agent status: Secondary | ICD-10-CM

## 2019-01-23 DIAGNOSIS — S0081XA Abrasion of other part of head, initial encounter: Secondary | ICD-10-CM | POA: Diagnosis present

## 2019-01-23 DIAGNOSIS — K5900 Constipation, unspecified: Secondary | ICD-10-CM | POA: Diagnosis not present

## 2019-01-23 DIAGNOSIS — J690 Pneumonitis due to inhalation of food and vomit: Secondary | ICD-10-CM | POA: Diagnosis present

## 2019-01-23 DIAGNOSIS — I4581 Long QT syndrome: Secondary | ICD-10-CM | POA: Diagnosis present

## 2019-01-23 DIAGNOSIS — Z85818 Personal history of malignant neoplasm of other sites of lip, oral cavity, and pharynx: Secondary | ICD-10-CM

## 2019-01-23 DIAGNOSIS — R579 Shock, unspecified: Secondary | ICD-10-CM | POA: Diagnosis present

## 2019-01-23 DIAGNOSIS — E782 Mixed hyperlipidemia: Secondary | ICD-10-CM | POA: Diagnosis present

## 2019-01-23 DIAGNOSIS — A419 Sepsis, unspecified organism: Secondary | ICD-10-CM | POA: Diagnosis not present

## 2019-01-23 DIAGNOSIS — I248 Other forms of acute ischemic heart disease: Secondary | ICD-10-CM | POA: Diagnosis present

## 2019-01-23 DIAGNOSIS — R7989 Other specified abnormal findings of blood chemistry: Secondary | ICD-10-CM | POA: Diagnosis not present

## 2019-01-23 DIAGNOSIS — K8020 Calculus of gallbladder without cholecystitis without obstruction: Secondary | ICD-10-CM | POA: Diagnosis present

## 2019-01-23 DIAGNOSIS — M47819 Spondylosis without myelopathy or radiculopathy, site unspecified: Secondary | ICD-10-CM | POA: Diagnosis present

## 2019-01-23 DIAGNOSIS — G9341 Metabolic encephalopathy: Secondary | ICD-10-CM | POA: Diagnosis present

## 2019-01-23 DIAGNOSIS — R471 Dysarthria and anarthria: Secondary | ICD-10-CM | POA: Diagnosis present

## 2019-01-23 DIAGNOSIS — Z9049 Acquired absence of other specified parts of digestive tract: Secondary | ICD-10-CM

## 2019-01-23 DIAGNOSIS — R4020 Unspecified coma: Secondary | ICD-10-CM | POA: Diagnosis not present

## 2019-01-23 DIAGNOSIS — N179 Acute kidney failure, unspecified: Secondary | ICD-10-CM

## 2019-01-23 DIAGNOSIS — R451 Restlessness and agitation: Secondary | ICD-10-CM | POA: Diagnosis not present

## 2019-01-23 DIAGNOSIS — I129 Hypertensive chronic kidney disease with stage 1 through stage 4 chronic kidney disease, or unspecified chronic kidney disease: Secondary | ICD-10-CM | POA: Diagnosis present

## 2019-01-23 DIAGNOSIS — G9340 Encephalopathy, unspecified: Secondary | ICD-10-CM

## 2019-01-23 DIAGNOSIS — K6389 Other specified diseases of intestine: Secondary | ICD-10-CM | POA: Diagnosis present

## 2019-01-23 DIAGNOSIS — I2510 Atherosclerotic heart disease of native coronary artery without angina pectoris: Secondary | ICD-10-CM | POA: Diagnosis present

## 2019-01-23 DIAGNOSIS — Z888 Allergy status to other drugs, medicaments and biological substances status: Secondary | ICD-10-CM

## 2019-01-23 DIAGNOSIS — Z66 Do not resuscitate: Secondary | ICD-10-CM | POA: Diagnosis not present

## 2019-01-23 DIAGNOSIS — Z880 Allergy status to penicillin: Secondary | ICD-10-CM

## 2019-01-23 DIAGNOSIS — Z8249 Family history of ischemic heart disease and other diseases of the circulatory system: Secondary | ICD-10-CM

## 2019-01-23 DIAGNOSIS — Z8674 Personal history of sudden cardiac arrest: Secondary | ICD-10-CM

## 2019-01-23 DIAGNOSIS — Z79899 Other long term (current) drug therapy: Secondary | ICD-10-CM

## 2019-01-23 DIAGNOSIS — J181 Lobar pneumonia, unspecified organism: Secondary | ICD-10-CM

## 2019-01-23 DIAGNOSIS — G44309 Post-traumatic headache, unspecified, not intractable: Secondary | ICD-10-CM | POA: Diagnosis not present

## 2019-01-23 DIAGNOSIS — D696 Thrombocytopenia, unspecified: Secondary | ICD-10-CM | POA: Diagnosis not present

## 2019-01-23 DIAGNOSIS — J189 Pneumonia, unspecified organism: Secondary | ICD-10-CM

## 2019-01-23 DIAGNOSIS — I252 Old myocardial infarction: Secondary | ICD-10-CM

## 2019-01-23 DIAGNOSIS — E876 Hypokalemia: Secondary | ICD-10-CM

## 2019-01-23 DIAGNOSIS — Z978 Presence of other specified devices: Secondary | ICD-10-CM

## 2019-01-23 DIAGNOSIS — Z781 Physical restraint status: Secondary | ICD-10-CM

## 2019-01-23 DIAGNOSIS — R404 Transient alteration of awareness: Secondary | ICD-10-CM | POA: Diagnosis not present

## 2019-01-23 DIAGNOSIS — M810 Age-related osteoporosis without current pathological fracture: Secondary | ICD-10-CM | POA: Diagnosis present

## 2019-01-23 DIAGNOSIS — Z8049 Family history of malignant neoplasm of other genital organs: Secondary | ICD-10-CM

## 2019-01-23 DIAGNOSIS — J9690 Respiratory failure, unspecified, unspecified whether with hypoxia or hypercapnia: Secondary | ICD-10-CM | POA: Diagnosis not present

## 2019-01-23 DIAGNOSIS — Z808 Family history of malignant neoplasm of other organs or systems: Secondary | ICD-10-CM

## 2019-01-23 DIAGNOSIS — E870 Hyperosmolality and hypernatremia: Secondary | ICD-10-CM | POA: Diagnosis present

## 2019-01-23 DIAGNOSIS — N171 Acute kidney failure with acute cortical necrosis: Secondary | ICD-10-CM | POA: Diagnosis not present

## 2019-01-23 DIAGNOSIS — Z87311 Personal history of (healed) other pathological fracture: Secondary | ICD-10-CM

## 2019-01-23 DIAGNOSIS — R0689 Other abnormalities of breathing: Secondary | ICD-10-CM | POA: Diagnosis not present

## 2019-01-23 DIAGNOSIS — J9601 Acute respiratory failure with hypoxia: Secondary | ICD-10-CM | POA: Diagnosis present

## 2019-01-23 DIAGNOSIS — Z8619 Personal history of other infectious and parasitic diseases: Secondary | ICD-10-CM

## 2019-01-23 DIAGNOSIS — S40021A Contusion of right upper arm, initial encounter: Secondary | ICD-10-CM | POA: Diagnosis present

## 2019-01-23 DIAGNOSIS — S40022A Contusion of left upper arm, initial encounter: Secondary | ICD-10-CM | POA: Diagnosis present

## 2019-01-23 DIAGNOSIS — N3289 Other specified disorders of bladder: Secondary | ICD-10-CM | POA: Diagnosis present

## 2019-01-23 LAB — POCT I-STAT 7, (LYTES, BLD GAS, ICA,H+H)
Acid-Base Excess: 1 mmol/L (ref 0.0–2.0)
Bicarbonate: 22.1 mmol/L (ref 20.0–28.0)
Calcium, Ion: 1.1 mmol/L — ABNORMAL LOW (ref 1.15–1.40)
HCT: 46 % (ref 39.0–52.0)
HEMOGLOBIN: 15.6 g/dL (ref 13.0–17.0)
O2 Saturation: 53 %
Potassium: <2 mmol/L — CL (ref 3.5–5.1)
Sodium: 129 mmol/L — ABNORMAL LOW (ref 135–145)
TCO2: 23 mmol/L (ref 22–32)
pCO2 arterial: 26.8 mmHg — ABNORMAL LOW (ref 32.0–48.0)
pH, Arterial: 7.524 — ABNORMAL HIGH (ref 7.350–7.450)
pO2, Arterial: 24 mmHg — CL (ref 83.0–108.0)

## 2019-01-23 LAB — GLUCOSE, CAPILLARY
GLUCOSE-CAPILLARY: 114 mg/dL — AB (ref 70–99)
Glucose-Capillary: 120 mg/dL — ABNORMAL HIGH (ref 70–99)
Glucose-Capillary: 127 mg/dL — ABNORMAL HIGH (ref 70–99)
Glucose-Capillary: 132 mg/dL — ABNORMAL HIGH (ref 70–99)
Glucose-Capillary: 133 mg/dL — ABNORMAL HIGH (ref 70–99)

## 2019-01-23 LAB — TYPE AND SCREEN
ABO/RH(D): A POS
Antibody Screen: NEGATIVE

## 2019-01-23 LAB — BASIC METABOLIC PANEL
Anion gap: 11 (ref 5–15)
BUN: 11 mg/dL (ref 8–23)
CO2: 13 mmol/L — ABNORMAL LOW (ref 22–32)
Calcium: 7.3 mg/dL — ABNORMAL LOW (ref 8.9–10.3)
Chloride: 118 mmol/L — ABNORMAL HIGH (ref 98–111)
Creatinine, Ser: 2.04 mg/dL — ABNORMAL HIGH (ref 0.61–1.24)
GFR calc non Af Amer: 32 mL/min — ABNORMAL LOW (ref >60)
GFR, EST AFRICAN AMERICAN: 37 mL/min — AB (ref >60)
Glucose, Bld: 150 mg/dL — ABNORMAL HIGH (ref 70–99)
Potassium: 3 mmol/L — ABNORMAL LOW (ref 3.5–5.1)
Sodium: 142 mmol/L (ref 135–145)

## 2019-01-23 LAB — BLOOD GAS, ARTERIAL
ACID-BASE DEFICIT: 8.8 mmol/L — AB (ref 0.0–2.0)
Bicarbonate: 16.9 mmol/L — ABNORMAL LOW (ref 20.0–28.0)
Drawn by: 30136
FIO2: 50
MECHVT: 510 mL
O2 Saturation: 98.9 %
PCO2 ART: 38.8 mmHg (ref 32.0–48.0)
PEEP/CPAP: 5 cmH2O
Patient temperature: 98.6
RATE: 16 resp/min
pH, Arterial: 7.263 — ABNORMAL LOW (ref 7.350–7.450)
pO2, Arterial: 242 mmHg — ABNORMAL HIGH (ref 83.0–108.0)

## 2019-01-23 LAB — RAPID URINE DRUG SCREEN, HOSP PERFORMED
Amphetamines: NOT DETECTED
Barbiturates: NOT DETECTED
Benzodiazepines: POSITIVE — AB
Cocaine: NOT DETECTED
Opiates: NOT DETECTED
Tetrahydrocannabinol: NOT DETECTED

## 2019-01-23 LAB — COMPREHENSIVE METABOLIC PANEL
ALT: 46 U/L — ABNORMAL HIGH (ref 0–44)
AST: 43 U/L — ABNORMAL HIGH (ref 15–41)
Albumin: 3 g/dL — ABNORMAL LOW (ref 3.5–5.0)
Alkaline Phosphatase: 122 U/L (ref 38–126)
Anion gap: 18 — ABNORMAL HIGH (ref 5–15)
BUN: 12 mg/dL (ref 8–23)
CO2: 20 mmol/L — ABNORMAL LOW (ref 22–32)
Calcium: 8.7 mg/dL — ABNORMAL LOW (ref 8.9–10.3)
Chloride: 92 mmol/L — ABNORMAL LOW (ref 98–111)
Creatinine, Ser: 2.65 mg/dL — ABNORMAL HIGH (ref 0.61–1.24)
GFR calc Af Amer: 27 mL/min — ABNORMAL LOW (ref >60)
GFR calc non Af Amer: 24 mL/min — ABNORMAL LOW (ref >60)
Glucose, Bld: 165 mg/dL — ABNORMAL HIGH (ref 70–99)
POTASSIUM: 2.2 mmol/L — AB (ref 3.5–5.1)
SODIUM: 130 mmol/L — AB (ref 135–145)
Total Bilirubin: 1.7 mg/dL — ABNORMAL HIGH (ref 0.3–1.2)
Total Protein: 5.7 g/dL — ABNORMAL LOW (ref 6.5–8.1)

## 2019-01-23 LAB — CORTISOL: Cortisol, Plasma: 24 ug/dL

## 2019-01-23 LAB — POCT I-STAT EG7
Acid-Base Excess: 1 mmol/L (ref 0.0–2.0)
Bicarbonate: 25 mmol/L (ref 20.0–28.0)
CALCIUM ION: 1.04 mmol/L — AB (ref 1.15–1.40)
HCT: 50 % (ref 39.0–52.0)
Hemoglobin: 17 g/dL (ref 13.0–17.0)
O2 Saturation: 53 %
PO2 VEN: 27 mmHg — AB (ref 32.0–45.0)
Potassium: <2 mmol/L — CL (ref 3.5–5.1)
Sodium: 131 mmol/L — ABNORMAL LOW (ref 135–145)
TCO2: 26 mmol/L (ref 22–32)
pCO2, Ven: 38.5 mmHg — ABNORMAL LOW (ref 44.0–60.0)
pH, Ven: 7.42 (ref 7.250–7.430)

## 2019-01-23 LAB — URINALYSIS, ROUTINE W REFLEX MICROSCOPIC
BILIRUBIN URINE: NEGATIVE
Bacteria, UA: NONE SEEN
Glucose, UA: 150 mg/dL — AB
KETONES UR: NEGATIVE mg/dL
Leukocytes, UA: NEGATIVE
Nitrite: NEGATIVE
Protein, ur: NEGATIVE mg/dL
Specific Gravity, Urine: 1.003 — ABNORMAL LOW (ref 1.005–1.030)
pH: 7 (ref 5.0–8.0)

## 2019-01-23 LAB — CBC WITH DIFFERENTIAL/PLATELET
Abs Immature Granulocytes: 0.1 10*3/uL — ABNORMAL HIGH (ref 0.00–0.07)
BASOS ABS: 0 10*3/uL (ref 0.0–0.1)
Basophils Relative: 0 %
Eosinophils Absolute: 0.1 10*3/uL (ref 0.0–0.5)
Eosinophils Relative: 1 %
HCT: 44.6 % (ref 39.0–52.0)
Hemoglobin: 15.5 g/dL (ref 13.0–17.0)
Immature Granulocytes: 1 %
Lymphocytes Relative: 10 %
Lymphs Abs: 0.7 10*3/uL (ref 0.7–4.0)
MCH: 33.3 pg (ref 26.0–34.0)
MCHC: 34.8 g/dL (ref 30.0–36.0)
MCV: 95.7 fL (ref 80.0–100.0)
Monocytes Absolute: 1 10*3/uL (ref 0.1–1.0)
Monocytes Relative: 15 %
Neutro Abs: 5 10*3/uL (ref 1.7–7.7)
Neutrophils Relative %: 73 %
Platelets: 191 10*3/uL (ref 150–400)
RBC: 4.66 MIL/uL (ref 4.22–5.81)
RDW: 13.1 % (ref 11.5–15.5)
WBC: 6.9 10*3/uL (ref 4.0–10.5)
nRBC: 0 % (ref 0.0–0.2)

## 2019-01-23 LAB — STREP PNEUMONIAE URINARY ANTIGEN: Strep Pneumo Urinary Antigen: NEGATIVE

## 2019-01-23 LAB — LACTIC ACID, PLASMA
Lactic Acid, Venous: 6.9 mmol/L (ref 0.5–1.9)
Lactic Acid, Venous: 4.6 mmol/L (ref 0.5–1.9)
Lactic Acid, Venous: 1.7 mmol/L (ref 0.5–1.9)

## 2019-01-23 LAB — TROPONIN I
TROPONIN I: 0.09 ng/mL — AB (ref <0.03)
Troponin I: 0.05 ng/mL (ref <0.03)
Troponin I: 0.2 ng/mL (ref <0.03)
Troponin I: 0.43 ng/mL (ref <0.03)

## 2019-01-23 LAB — ABO/RH: ABO/RH(D): A POS

## 2019-01-23 LAB — SODIUM, URINE, RANDOM: Sodium, Ur: 53 mmol/L

## 2019-01-23 LAB — I-STAT CREATININE, ED: Creatinine, Ser: 2.4 mg/dL — ABNORMAL HIGH (ref 0.61–1.24)

## 2019-01-23 LAB — I-STAT TROPONIN, ED: Troponin i, poc: 0.05 ng/mL (ref 0.00–0.08)

## 2019-01-23 LAB — CBG MONITORING, ED
Glucose-Capillary: 157 mg/dL — ABNORMAL HIGH (ref 70–99)
Glucose-Capillary: 150 mg/dL — ABNORMAL HIGH (ref 70–99)

## 2019-01-23 LAB — TSH: TSH: 1.246 u[IU]/mL (ref 0.350–4.500)

## 2019-01-23 LAB — HIV ANTIBODY (ROUTINE TESTING W REFLEX): HIV Screen 4th Generation wRfx: NONREACTIVE

## 2019-01-23 LAB — LIPASE, BLOOD: Lipase: 32 U/L (ref 11–51)

## 2019-01-23 LAB — CREATININE, URINE, RANDOM: Creatinine, Urine: <10 mg/dL

## 2019-01-23 LAB — AMMONIA: AMMONIA: 25 umol/L (ref 9–35)

## 2019-01-23 LAB — ETHANOL

## 2019-01-23 LAB — MRSA PCR SCREENING: MRSA BY PCR: NEGATIVE

## 2019-01-23 LAB — MAGNESIUM: Magnesium: 1.8 mg/dL (ref 1.7–2.4)

## 2019-01-23 MED ORDER — SUCCINYLCHOLINE CHLORIDE 20 MG/ML IJ SOLN
80.0000 mg | Freq: Once | INTRAMUSCULAR | Status: AC
  Administered 2019-01-23: 80 mg via INTRAVENOUS
  Filled 2019-01-23: qty 4

## 2019-01-23 MED ORDER — HALOPERIDOL LACTATE 5 MG/ML IJ SOLN
5.0000 mg | Freq: Once | INTRAMUSCULAR | Status: AC
  Administered 2019-01-23: 5 mg via INTRAMUSCULAR

## 2019-01-23 MED ORDER — ASPIRIN 81 MG PO CHEW
81.0000 mg | CHEWABLE_TABLET | Freq: Every day | ORAL | Status: DC
  Administered 2019-01-25 – 2019-01-29 (×5): 81 mg via ORAL
  Filled 2019-01-23 (×7): qty 1

## 2019-01-23 MED ORDER — SODIUM CHLORIDE 0.9 % IV SOLN
2.0000 g | INTRAVENOUS | Status: AC
  Administered 2019-01-23 – 2019-01-27 (×5): 2 g via INTRAVENOUS
  Filled 2019-01-23 (×5): qty 20

## 2019-01-23 MED ORDER — MIDAZOLAM 50MG/50ML (1MG/ML) PREMIX INFUSION
0.0000 mg/h | INTRAVENOUS | Status: DC
  Administered 2019-01-23: 2 mg/h via INTRAVENOUS
  Filled 2019-01-23 (×2): qty 50

## 2019-01-23 MED ORDER — SODIUM CHLORIDE 0.9 % IV BOLUS (SEPSIS)
1000.0000 mL | Freq: Once | INTRAVENOUS | Status: AC
  Administered 2019-01-23: 1000 mL via INTRAVENOUS

## 2019-01-23 MED ORDER — IOPAMIDOL (ISOVUE-370) INJECTION 76%
INTRAVENOUS | Status: AC
  Filled 2019-01-23: qty 100

## 2019-01-23 MED ORDER — SODIUM CHLORIDE 0.9 % IV SOLN
2.0000 g | Freq: Once | INTRAVENOUS | Status: AC
  Administered 2019-01-23: 2 g via INTRAVENOUS
  Filled 2019-01-23: qty 2

## 2019-01-23 MED ORDER — VANCOMYCIN HCL IN DEXTROSE 1-5 GM/200ML-% IV SOLN
1000.0000 mg | Freq: Once | INTRAVENOUS | Status: AC
  Administered 2019-01-23: 1000 mg via INTRAVENOUS
  Filled 2019-01-23: qty 200

## 2019-01-23 MED ORDER — ETOMIDATE 2 MG/ML IV SOLN
10.0000 mg | Freq: Once | INTRAVENOUS | Status: AC
  Administered 2019-01-23: 10 mg via INTRAVENOUS

## 2019-01-23 MED ORDER — MIDAZOLAM BOLUS VIA INFUSION
1.0000 mg | INTRAVENOUS | Status: DC | PRN
  Administered 2019-01-24: 1 mg via INTRAVENOUS
  Administered 2019-01-24: 2 mg via INTRAVENOUS
  Filled 2019-01-23: qty 2

## 2019-01-23 MED ORDER — FENTANYL 2500MCG IN NS 250ML (10MCG/ML) PREMIX INFUSION
25.0000 ug/h | INTRAVENOUS | Status: DC
  Administered 2019-01-23: 150 ug/h via INTRAVENOUS
  Administered 2019-01-24: 325 ug/h via INTRAVENOUS
  Administered 2019-01-24: 250 ug/h via INTRAVENOUS
  Administered 2019-01-24: 200 ug/h via INTRAVENOUS
  Administered 2019-01-25: 380 ug/h via INTRAVENOUS
  Administered 2019-01-25: 375 ug/h via INTRAVENOUS
  Administered 2019-01-25: 380 ug/h via INTRAVENOUS
  Administered 2019-01-26: 375 ug/h via INTRAVENOUS
  Administered 2019-01-26: 250 ug/h via INTRAVENOUS
  Administered 2019-01-27: 150 ug/h via INTRAVENOUS
  Administered 2019-01-27: 300 ug/h via INTRAVENOUS
  Administered 2019-01-28: 400 ug/h via INTRAVENOUS
  Administered 2019-01-28: 125 ug/h via INTRAVENOUS
  Administered 2019-01-28: 250 ug/h via INTRAVENOUS
  Administered 2019-01-29: 350 ug/h via INTRAVENOUS
  Administered 2019-01-29: 200 ug/h via INTRAVENOUS
  Administered 2019-01-30: 400 ug/h via INTRAVENOUS
  Administered 2019-01-30: 250 ug/h via INTRAVENOUS
  Administered 2019-01-31 (×3): 300 ug/h via INTRAVENOUS
  Administered 2019-02-01: 250 ug/h via INTRAVENOUS
  Filled 2019-01-23 (×9): qty 250
  Filled 2019-01-23: qty 500
  Filled 2019-01-23 (×11): qty 250

## 2019-01-23 MED ORDER — SENNOSIDES 8.8 MG/5ML PO SYRP
5.0000 mL | ORAL_SOLUTION | Freq: Two times a day (BID) | ORAL | Status: DC | PRN
  Filled 2019-01-23: qty 5

## 2019-01-23 MED ORDER — POTASSIUM CHLORIDE 20 MEQ/15ML (10%) PO SOLN
60.0000 meq | Freq: Once | ORAL | Status: AC
  Administered 2019-01-23: 60 meq
  Filled 2019-01-23: qty 45

## 2019-01-23 MED ORDER — LORAZEPAM 2 MG/ML IJ SOLN
INTRAMUSCULAR | Status: AC
  Filled 2019-01-23: qty 1

## 2019-01-23 MED ORDER — METRONIDAZOLE IN NACL 5-0.79 MG/ML-% IV SOLN
500.0000 mg | Freq: Three times a day (TID) | INTRAVENOUS | Status: DC
  Administered 2019-01-23: 500 mg via INTRAVENOUS
  Filled 2019-01-23: qty 100

## 2019-01-23 MED ORDER — ALBUTEROL SULFATE (2.5 MG/3ML) 0.083% IN NEBU: 2.5000 mg | INHALATION_SOLUTION | RESPIRATORY_TRACT | Status: DC | PRN

## 2019-01-23 MED ORDER — MIDAZOLAM HCL 2 MG/2ML IJ SOLN
INTRAMUSCULAR | Status: AC
  Filled 2019-01-23: qty 2

## 2019-01-23 MED ORDER — NOREPINEPHRINE 16 MG/250ML-% IV SOLN
0.0000 ug/min | INTRAVENOUS | Status: DC
  Administered 2019-01-24: 23 ug/min via INTRAVENOUS
  Administered 2019-01-24: 2 ug/min via INTRAVENOUS
  Administered 2019-01-25: 17 ug/min via INTRAVENOUS
  Administered 2019-01-26: 6 ug/min via INTRAVENOUS
  Filled 2019-01-23 (×5): qty 250

## 2019-01-23 MED ORDER — FENTANYL CITRATE (PF) 100 MCG/2ML IJ SOLN
50.0000 ug | Freq: Once | INTRAMUSCULAR | Status: AC
  Administered 2019-01-23: 50 ug via INTRAVENOUS

## 2019-01-23 MED ORDER — HALOPERIDOL LACTATE 5 MG/ML IJ SOLN
INTRAMUSCULAR | Status: AC
  Filled 2019-01-23: qty 1

## 2019-01-23 MED ORDER — VANCOMYCIN HCL IN DEXTROSE 1-5 GM/200ML-% IV SOLN: 1000.0000 mg | INTRAVENOUS | Status: DC

## 2019-01-23 MED ORDER — FENTANYL 2500MCG IN NS 250ML (10MCG/ML) PREMIX INFUSION
0.0000 ug/h | INTRAVENOUS | Status: DC
  Administered 2019-01-23: 25 ug/h via INTRAVENOUS
  Filled 2019-01-23 (×2): qty 250

## 2019-01-23 MED ORDER — MIDAZOLAM 50MG/50ML (1MG/ML) PREMIX INFUSION
0.5000 mg/h | INTRAVENOUS | Status: DC
  Administered 2019-01-23: 0.5 mg/h via INTRAVENOUS
  Filled 2019-01-23: qty 50

## 2019-01-23 MED ORDER — ASPIRIN EC 81 MG PO TBEC: 81.0000 mg | DELAYED_RELEASE_TABLET | Freq: Every day | ORAL | Status: DC

## 2019-01-23 MED ORDER — EPINEPHRINE PF 1 MG/10ML IJ SOSY
PREFILLED_SYRINGE | INTRAMUSCULAR | Status: AC
  Filled 2019-01-23: qty 10

## 2019-01-23 MED ORDER — SODIUM CHLORIDE 0.9 % IV SOLN
500.0000 mg | INTRAVENOUS | Status: AC
  Administered 2019-01-23 – 2019-01-27 (×5): 500 mg via INTRAVENOUS
  Filled 2019-01-23 (×5): qty 500

## 2019-01-23 MED ORDER — SODIUM CHLORIDE 0.9 % IV SOLN
INTRAVENOUS | Status: DC | PRN
  Administered 2019-01-23 (×3): 250 mL via INTRAVENOUS
  Administered 2019-01-25: 1000 mL via INTRAVENOUS
  Administered 2019-01-28: 500 mL via INTRAVENOUS
  Administered 2019-02-01 – 2019-02-11 (×4): 250 mL via INTRAVENOUS

## 2019-01-23 MED ORDER — CHLORHEXIDINE GLUCONATE 0.12% ORAL RINSE (MEDLINE KIT)
15.0000 mL | Freq: Two times a day (BID) | OROMUCOSAL | Status: DC
  Administered 2019-01-23 – 2019-02-01 (×18): 15 mL via OROMUCOSAL

## 2019-01-23 MED ORDER — FENTANYL BOLUS VIA INFUSION
25.0000 ug | INTRAVENOUS | Status: DC | PRN
  Administered 2019-01-23 – 2019-02-01 (×17): 25 ug via INTRAVENOUS
  Filled 2019-01-23: qty 25

## 2019-01-23 MED ORDER — ATROPINE SULFATE 1 MG/10ML IJ SOSY
PREFILLED_SYRINGE | INTRAMUSCULAR | Status: AC
  Filled 2019-01-23: qty 10

## 2019-01-23 MED ORDER — ORAL CARE MOUTH RINSE
15.0000 mL | OROMUCOSAL | Status: DC
  Administered 2019-01-23 – 2019-02-01 (×82): 15 mL via OROMUCOSAL

## 2019-01-23 MED ORDER — POTASSIUM CHLORIDE 10 MEQ/50ML IV SOLN
10.0000 meq | INTRAVENOUS | Status: AC
  Administered 2019-01-23 (×2): 10 meq via INTRAVENOUS
  Filled 2019-01-23 (×2): qty 50

## 2019-01-23 MED ORDER — CLOPIDOGREL BISULFATE 75 MG PO TABS
75.0000 mg | ORAL_TABLET | Freq: Every day | ORAL | Status: DC
  Administered 2019-01-25 – 2019-01-29 (×5): 75 mg via ORAL
  Filled 2019-01-23 (×7): qty 1

## 2019-01-23 MED ORDER — MIDAZOLAM HCL 2 MG/2ML IJ SOLN
2.0000 mg | Freq: Once | INTRAMUSCULAR | Status: AC
  Administered 2019-01-23: 2 mg via INTRAVENOUS

## 2019-01-23 MED ORDER — VASOPRESSIN 20 UNIT/ML IV SOLN
0.0300 [IU]/min | INTRAVENOUS | Status: DC
  Administered 2019-01-23 – 2019-01-26 (×4): 0.03 [IU]/min via INTRAVENOUS
  Filled 2019-01-23 (×5): qty 2

## 2019-01-23 MED ORDER — NOREPINEPHRINE-SODIUM CHLORIDE 4-0.9 MG/250ML-% IV SOLN
0.0000 ug/min | INTRAVENOUS | Status: DC
  Administered 2019-01-23: 2 ug/min via INTRAVENOUS
  Administered 2019-01-23 (×2): 22 ug/min via INTRAVENOUS
  Administered 2019-01-23: 25 ug/min via INTRAVENOUS
  Filled 2019-01-23 (×7): qty 250

## 2019-01-23 MED ORDER — HEPARIN SODIUM (PORCINE) 5000 UNIT/ML IJ SOLN
5000.0000 [IU] | Freq: Three times a day (TID) | INTRAMUSCULAR | Status: DC
  Administered 2019-01-23 – 2019-01-25 (×7): 5000 [IU] via SUBCUTANEOUS
  Filled 2019-01-23 (×8): qty 1

## 2019-01-23 MED ORDER — SODIUM CHLORIDE 0.9 % IV SOLN
1000.0000 mL | INTRAVENOUS | Status: DC
  Administered 2019-01-23 – 2019-01-24 (×4): 1000 mL via INTRAVENOUS

## 2019-01-23 MED ORDER — MAGNESIUM SULFATE 2 GM/50ML IV SOLN
2.0000 g | Freq: Once | INTRAVENOUS | Status: AC
  Administered 2019-01-23: 2 g via INTRAVENOUS
  Filled 2019-01-23: qty 50

## 2019-01-23 MED ORDER — FAMOTIDINE IN NACL 20-0.9 MG/50ML-% IV SOLN
20.0000 mg | Freq: Every day | INTRAVENOUS | Status: DC
  Administered 2019-01-23 – 2019-01-29 (×7): 20 mg via INTRAVENOUS
  Filled 2019-01-23 (×9): qty 50

## 2019-01-23 MED ORDER — BISACODYL 10 MG RE SUPP: 10.0000 mg | Freq: Every day | RECTAL | Status: DC | PRN

## 2019-01-23 MED ORDER — POTASSIUM CHLORIDE 10 MEQ/100ML IV SOLN
10.0000 meq | INTRAVENOUS | Status: AC
  Administered 2019-01-23 (×5): 10 meq via INTRAVENOUS
  Filled 2019-01-23 (×6): qty 100

## 2019-01-23 MED ORDER — ONDANSETRON HCL 4 MG/2ML IJ SOLN: 4.0000 mg | Freq: Once | INTRAMUSCULAR | Status: DC

## 2019-01-23 NOTE — Procedures (Signed)
Arterial Catheter Insertion Procedure Note Eric Dudley [id number] May 05, 1951  Procedure: Insertion of Arterial Catheter  Indications: Blood pressure monitoring and Frequent blood sampling  Procedure Details Consent: Risks of procedure as well as the alternatives and risks of each were explained to the (patient/caregiver).  Consent for procedure obtained. Time Out: Verified patient identification, verified procedure, site/side was marked, verified correct patient position, special equipment/implants available, medications/allergies/relevent history reviewed, required imaging and test results available.  Performed  Maximum sterile technique was used including antiseptics, cap, gloves, gown, hand hygiene and mask. Skin prep: Chlorhexidine; local anesthetic administered 20 gauge catheter was inserted into left radial artery using the Seldinger technique. ULTRASOUND GUIDANCE USED: YES Evaluation Blood flow good; BP tracing good. Complications: No apparent complications.   Phillis Knack [facility] 01/08/2019

## 2019-01-23 NOTE — Progress Notes (Signed)
RT Note:  Suctioned and transported patient to and from CT without any complications.  Will continue to monitor.

## 2019-01-23 NOTE — Progress Notes (Signed)
CRITICAL VALUE ALERT  Critical Value: Troponin   Date & Time Notied: 01/08/2019 22:50  Provider Notified: Dr. Oletta Darter   Orders Received/Actions taken: Orders given

## 2019-01-23 NOTE — Progress Notes (Signed)
[facility] Progress Note Patient Name: Eric Dudley DOB: 27-Oct-1951 MRN: [id number]   Date of Service  01/15/2019  HPI/Events of Note  K+ and Mg++ replaced today.   eICU Interventions  Will order: 1. Repeat BMP and Mg++ level now.      Intervention Category Major Interventions: Electrolyte abnormality - evaluation and management  Eran Mistry [place] 01/05/2019, 10:04 PM

## 2019-01-23 NOTE — Progress Notes (Signed)
RT Note:  Received blood from Ward, DO in ED to do an ABG.  Was informed by Ward that it may contain some venous.  Ran ABG, results given to Ward, DO.

## 2019-01-23 NOTE — ED Notes (Signed)
Dr. Leonides Schanz ( EDP) at bedside inserting CVC line at pt.'s right groin , 2nd run Kcl infusing , Levophed /Fentanyl /Versed drips infusing , IV sites intact , ETT/OGT intact .

## 2019-01-23 NOTE — Progress Notes (Signed)
Pharmacy Antibiotic Note  Eric Dudley is a 68 y.o. male admitted on 01/07/2019 with sepsis.  Pharmacy has been consulted for vancomycin and aztreonam dosing. Aztreonam 2gm and vanc 1gm ordered in ED  Plan: Cont vanc 1gm IV q48 hours F/u admission to change aztreonam as allergy listed to amoxicillin>>rash.  Height: 5\' 10"  (177.8 cm) Weight: 130 lb (59 kg) IBW/kg (Calculated) : 73  Temp (24hrs), Avg:97.9 F (36.6 C), Min:97.9 F (36.6 C), Max:97.9 F (36.6 C)  Recent Labs  Lab 12/30/2018 0255 12/28/2018 0340  WBC 6.9  --   CREATININE  --  2.40*    Estimated Creatinine Clearance: 24.2 mL/min (A) (by C-G formula based on SCr of 2.4 mg/dL (H)).    Allergies  Allergen Reactions  . Hydrochlorothiazide Rash and Palpitations  . Asa [Aspirin] Nausea Only  . Calcium Carbonate Nausea And Vomiting  . Oxycodone Other (See Comments)    Patient does not want to take. Severe Hallucinations.  . Amoxicillin Itching and Rash  . Indapamide Palpitations    Lightheadedness, dizziness    Thank you for allowing pharmacy to be a part of this patient's care.  Excell Seltzer Poteet 01/19/2019 3:53 AM

## 2019-01-23 NOTE — ED Triage Notes (Signed)
Patient placed on a monitor / pulse oximetry , NS IV boluses ( 2 bag NS 2000 ml) infusing , blood cultures collected , Levophed drip started for hypotension . IV sites intact .

## 2019-01-23 NOTE — H&P (Signed)
NAME:  Eric Dudley, MRN:  [id number], DOB:  04/08/1951, LOS: 0 ADMISSION DATE:  01/12/2019, CONSULTATION DATE:  01/01/2019 REFERRING MD:  Dr. Leonides Schanz, CHIEF COMPLAINT:  AMS/ shock  Brief History   68 yoM with recent URI symptoms, not eating well, drinking well with 3 day hx of dizziness, syncopal episode, and ?vomiting.  Brief arrest vs syncopal episode with EMS with < 60 sec CPR.  In ER, remained hypotensive and agitated, requiring intubation for airway protection.  Required pressors for hypotension despite IVF, found to have AKI, hypokalemia, and LLL PNA.  Started empiric abx.  PCCM to admit.  History of present illness   HPI obtained from medical chart and by conversation with wife as patient is sedated and intubated on mechanical ventilation.   68 year old male with history of nonsmoker, Multiple myeloma (dx 2016, in remission on Revlimid), HTN, HLD, previous NICM with improved EF on recent TTE to 65%, CAD, hx tonsillar cancer.  Wife reports patient has had a cold for the last two weeks after catching it from her.  He recently went to doctor and placed on antibiotics as his symptoms and cough did not improve. States he has not ate well since but has been drinking well.  3 days ago, patient developed dizziness and had syncopal episode at home.  He continued to feel back and fell again last night. He starting complaining of leg and back pain and said he had been vomiting, although wife has not witnessed any vomiting.   Wife called EMS and had brief episode of apnea and pulseless and received CPR for < 60 secs prior to patient becoming conscious during transport.  In ER, patient remained confused and agitated.  He was intubated for airway protection.  He was profoundly hypotensive, despite 4L IVF requiring vasopressors.  He was mildly hypothermic, not hypoxic, and bradycardic with several pauses into the 30's while in ER.  CT head and cervical neg, CT abd performed showing left lower lobe pneumoina and  mild hydronephrosis and distended bladder.  Labs showed Lactic 6.9-> 4.6, K 2.2, sCr 2.65, WBC 6.9, mild transaminitis, UA pending.  EKG SB, with initial diffuse STD, no STE and prolonged QTc.  Started on empiric antibiotics and PCCM called for admission.   Past Medical History  Multiple myeloma (dx 2016, in remission on Revlimid), HTN, HLD, non smoker, previous NICM with improved EF on recent TTE to 65%, CAD, hx tonsillar cancer  Significant Hospital Events   1/27 Admit  Consults:   Procedures:  1/27 ETT >> 1/27 R femoral TL CVL >> 1/27 foley >>  Significant Diagnostic Tests:  01/06/2019 CTH/ cervical  >> 1. No acute intracranial abnormality. 2. Diffuse bone marrow disease consistent with multiple myeloma.  01/22/2019 CT abd/ pelvis >> 1. Aspiration or pneumonia in the left more than right lung base. 2. Bilateral hydronephrosis and hydroureter above an over distended bladder. 3. Nephrolithiasis and cholelithiasis. 4. Sigmoid epiploic appendagitis of indeterminate chronicity.  Micro Data:  1/27 BCx 2 >> 1/27 urine cx >> 1/27 sputum cx  >> 1/27 MRSA PCR >>  Antimicrobials:  1/27 vanc x 1 1/27 flagyl x 1 1/27 aztreonam x 1 1/27 azithromax  1/27 ceftriaxone >>  Interim history/subjective:  Currently on levophed 30 mcg/min, versed 54m/hr, fentanyl 2059m/hr Liable BP per RN  Objective   Blood pressure 127/77, pulse 83, temperature (!) 95.2 F (35.1 C), resp. rate 16, height '5\' 10"'  (1.778 m), weight 59 kg, SpO2 100 %.  Vent Mode: PRVC FiO2 (%):  [50 %] 50 % Set Rate:  [16 bmp] 16 bmp Vt Set:  [510 mL] 510 mL PEEP:  [5 cmH20] 5 cmH20 Plateau Pressure:  [12 cmH20] 12 cmH20   Intake/Output Summary (Last 24 hours) at 01/06/2019 6503 Last data filed at 12/28/2018 5465 Gross per 24 hour  Intake 5000 ml  Output 1100 ml  Net 3900 ml   Filed Weights   01/06/2019 0303  Weight: 59 kg    Examination: General: Thin critically ill adult male sedated on MV in NAD HEENT: MM  pink/dry, pupils 3/reactive, anicteric, ETT 7.5 at 24 cm, OGT, no JVD, abrasion to nasal bridge and right forehead Neuro: sedated CV: SB, rrr, no murmur, weak peripheral pulses PULM: even/non-labored on full MV support, lungs bilaterally CTA GI: soft, non-tender, bs active, foley w/cyu Extremities: warm/dry, no edema  Skin: no rashes, bilateral arms with bruising and wrapped in ACE bandages  Resolved Hospital Problem list    Assessment & Plan:  Shock- presumed septic, vs hypovolemia from dehydration vs cardiogenic  P:  Goal MAP >65 Continue levophed, add vasopressin, consider Giapreza  Insert Aline once in ICU Continue femoral CVL for now, will need to be changed to CVL or PICC (depending on renal function) Assess cortisol  Trend lactate Continue IVF bair huggar for temp goal 36  Acute respiratory failure LLL infiltrate (immunosuppressed) P:  Full MV support, PRVC 8cc/kg, rate 15 Wean FiO2 for sats > 92-99% Repeat ABG now (alkalotic on previous ABG) VAP measures pepcid for SUP Continue with azithro and ceftriaxone (PCN allergy noted, check with pharmacy and patient has tolerated cefepime in past) Send trach asp and urine strep  Brief Cardiac arrest vs syncope episode with <60 sec CPR 1/27 - wife reports syncope episode x 2 at home with dizziness  Bradycardia- with pauses into the 30's Prolonged QTc- 0.559 Elevated Troponin - EKG- no STE, improving diffuse STD, SB - per cards notes, previously on midodrine for orthostatic hypotension since off P:  Tele monitor for ongoing arrhthymias/ ectopy/ pauses TTE ordered Trend troponin and EKG Consider Cardiology consult if ongoing pauses Goal K >2, mag >2 Wife wishes for no more CPR   Acute encephalopathy likely related to hypotension -CTH/ cervical neg P:  Monitor Ongoing neuro checks PAD protocol with fentanyl and versed w/ bowel regimen Daily WUA RASS goal 0/-1  AKI on CKD2 Bilateral mild hydronephrosis with  distended bladder- s/p foley with ~1.2 L output Hypokalemia P:  On 4/7 runs of KCL Add mag 2 gm now Recheck BMP at  [employer] foley given critical illness Check renal ultrasound Check urine Cr/ Na Strict I/O's, daily wts, UOP trend Repeat renal function in am  Multiple myeloma - in remission P:  Hold revlimid   Hx HTN, HLD P:  Hold zocor, losartan Continue daily ASA 84m  Hyperglycemia P:  CBG a 4, add SSI if > 180  Mild Transaminitis - CT abd without acute process P:  Repeat LFTs in am    Best practice:  Diet: NPO Pain/Anxiety/Delirium protocol (if indicated): fentanyl / versed VAP protocol (if indicated): yes DVT prophylaxis: SCDs/ heparin GI prophylaxis: pepcid Glucose control: CBG q 4 Mobility: BR Code Status: After lengthly discussion with wife, she states patient has recently told her he was so tired of feeling bad and didn't want to do this anymore.  She states he would not want this but is agreeable to continue short term support for now.  She states no  CPR if despite all current aggressive measures he were to decompensate to this.  Orders change to reflect this  Family Communication: Wife, Nelle, and stepson updated at bedside Disposition: admit ICU  Labs   CBC: Recent Labs  Lab 01/06/2019 0249 01/26/2019 0255 01/05/2019 0306  WBC  --  6.9  --   NEUTROABS  --  5.0  --   HGB 17.0 15.5 15.6  HCT 50.0 44.6 46.0  MCV  --  95.7  --   PLT  --  191  --     Basic Metabolic Panel: Recent Labs  Lab 01/11/2019 0249 01/26/2019 0255 01/26/2019 0306 01/10/2019 0340  NA 131* 130* 129*  --   K <2.0* 2.2* <2.0*  --   CL  --  92*  --   --   CO2  --  20*  --   --   GLUCOSE  --  165*  --   --   BUN  --  12  --   --   CREATININE  --  2.65*  --  2.40*  CALCIUM  --  8.7*  --   --   MG  --  1.8  --   --    GFR: Estimated Creatinine Clearance: 24.2 mL/min (A) (by C-G formula based on SCr of 2.4 mg/dL (H)). Recent Labs  Lab 01/11/2019 0255 01/17/2019 0500  WBC 6.9   --   LATICACIDVEN 6.9* 4.6*    Liver Function Tests: Recent Labs  Lab 01/14/2019 0255  AST 43*  ALT 46*  ALKPHOS 122  BILITOT 1.7*  PROT 5.7*  ALBUMIN 3.0*   Recent Labs  Lab 12/28/2018 0255  LIPASE 32   Recent Labs  Lab 01/11/2019 0302  AMMONIA 25    ABG    Component Value Date/Time   PHART 7.524 (H) 12/29/2018 0306   PCO2ART 26.8 (L) 01/10/2019 0306   PO2ART 24.0 (LL) 01/01/2019 0306   HCO3 22.1 01/09/2019 0306   TCO2 23 12/30/2018 0306   O2SAT 53.0 01/20/2019 0306     Coagulation Profile: No results for input(s): INR, PROTIME in the last 168 hours.  Cardiac Enzymes: Recent Labs  Lab 12/29/2018 0255  TROPONINI 0.05*    HbA1C: No results found for: HGBA1C  CBG: Recent Labs  Lab 01/07/2019 0244  GLUCAP 157*    Review of Systems:   Unable to obtain  Past Medical History  He,  has a past medical history of Arthritis, Cholelithiases, Chronic kidney disease, Hyperlipidemia, Hypertension, Multiple myeloma ([place]), and Tonsillar cancer ([place]) (2006).   Surgical History    Past Surgical History:  Procedure Laterality Date  . APPENDECTOMY  1962  . CARDIAC CATHETERIZATION N/A 04/30/2015   Procedure: Left Heart Cath and Coronary Angiography;  Surgeon: Peter M Martinique, MD;  Location: [facility] INVASIVE CV LAB CUPID;  Service: Cardiovascular;  Laterality: N/A;  . CARDIOLITE MYOCARDIAL PERFUSION STUDY  04/16/03   NEGITIVE BRUCE PROTOCAL EXERCISE STRESS TEST. EF 70%. NO ISCHEMIA.  [name] KNEE SURGERY  2012   right  . [place]   left     Social History   reports that he quit smoking about 15 years ago. His smoking use included cigars. He quit smokeless tobacco use about 24 years ago.  His smokeless tobacco use included chew. He reports that he does not drink alcohol or use drugs.   Family History   His family history includes Cancer in his father and sister; Cancer (age of onset: 44) in his mother;  Hypertension in his sister.   Allergies Allergies  Allergen  Reactions  . Hydrochlorothiazide Rash and Palpitations  . Asa [Aspirin] Nausea Only  . Calcium Carbonate Nausea And Vomiting  . Oxycodone Other (See Comments)    Patient does not want to take. Severe Hallucinations.  . Amoxicillin Itching and Rash  . Indapamide Palpitations    Lightheadedness, dizziness     Home Medications  Prior to Admission medications   Medication Sig Start Date End Date Taking? Authorizing Provider  acetaminophen-codeine (TYLENOL #3) 300-30 MG tablet Take 1 tablet by mouth every 8 (eight) hours as needed for severe pain. 10/13/18   Truitt Merle, MD  aspirin 81 MG chewable tablet Chew 1 tablet (81 mg total) by mouth daily. 05/01/15   Caren Griffins, MD  calcium carbonate (OS-CAL) 600 MG TABS tablet Take 1,200 mg by mouth at bedtime.     [provider]  Cholecalciferol (VITAMIN D3) 1000 units CAPS Take 2,000 mg by mouth daily.    [provider]  losartan (COZAAR) 25 MG tablet Take 1 tablet (25 mg total) by mouth daily. 01/12/19 04/12/19  Hilty, Nadean Corwin, MD  Multiple Vitamins-Minerals (CENTRUM SILVER ULTRA MENS PO) Take 1 tablet by mouth at bedtime.     [provider]  naftifine (NAFTIN) 1 % cream Apply 1 application topically daily as needed.    [provider]  nitroGLYCERIN (NITROSTAT) 0.4 MG SL tablet Place 0.4 mg under the tongue every 5 (five) minutes x 3 doses as needed for chest pain. Reported on 05/19/2016    [provider]  ondansetron (ZOFRAN) 4 MG tablet TAKE 1 TABLET FOUR TIMES A DAY AS NEEDED FOR NAUSEA OR VOMITING 12/08/18   Truitt Merle, MD  potassium chloride (MICRO-K) 10 MEQ CR capsule TAKE 3 CAPSULES DAILY 11/08/18   Truitt Merle, MD  ranitidine (ZANTAC) 150 MG tablet Take 2 tablets (300 mg total) by mouth at bedtime. 05/24/15   Truitt Merle, MD  REVLIMID 5 MG capsule Take 1 capsule (5 mg total) by mouth as directed. Take 5 mg daily for 21 days, rest 7 days. 12/30/18   Truitt Merle, MD  simvastatin (ZOCOR) 20 MG tablet  Take 20 mg by mouth daily.    [provider]  vitamin B-12 (CYANOCOBALAMIN) 500 MCG tablet Take 1 tablet by mouth daily.    [provider]  zolpidem (AMBIEN) 5 MG tablet Take 1 tablet (5 mg total) by mouth at bedtime as needed for sleep. 04/26/17   Theodis Blaze, MD     Critical care time: [address] mins    Kennieth Rad, MSN, AGACNP-BC [facility] Pgr: [phone] or if no answer [phone] 01/01/2019, 8:53 AM

## 2019-01-23 NOTE — Progress Notes (Signed)
About 1030 this morning patient dropped his BP to 65/48 and continued to drop to 41/29. Pt. Then became bradycardic in the 40s with PVS and a heart block. Sedation stopped, CCM called and vaso started. Pt.'s BP started to rise quickly and in a matter of a few minutes the HR trailed. Family at bedside and updated. A copy of the EEG strip places in chart.

## 2019-01-23 NOTE — ED Triage Notes (Signed)
Patient arrived with EMS from home reports low back pain , left hip and left shoulder pain , EMS reported brief episode of apnea/cyanosis and pulseless - CPR for 20 secs, , alert and conscious at arrival , history multiple myeloma.

## 2019-01-23 NOTE — ED Notes (Signed)
Patient intubated by EDP , ETT/OGT intact , Vancomycin and Flagyl IV infusing , Levophed drip infusing , IV sites intact , plan of care explained by EDP to spouse.

## 2019-01-23 NOTE — Progress Notes (Signed)
[facility] Progress Note Patient Name: Eric Dudley DOB: [birth year] MRN: [id number]   Date of Service  01/16/2019  HPI/Events of Note  Troponin = 0.09 --> 0.20 --> 0.43. Recent Hx of NSTEMI and patient was on ASA and Plavix as outpatient. Currently on ASA.   eICU Interventions  Will order: 1. Plavix 75 mg PO now and Q day. 2. Consider cardiology consult in AM.      Intervention Category Intermediate Interventions: Diagnostic test evaluation  Eric Dudley 01/18/2019, 10:46 PM

## 2019-01-23 NOTE — ED Notes (Addendum)
Returned from CT scan , ETT/OGT/IV sites intact , Levophed drip/Fentanyl drip/Versed drip / 1st run of Kcl IV infusing.

## 2019-01-23 NOTE — ED Provider Notes (Signed)
TIME SEEN: 2:38 AM  CHIEF COMPLAINT: Cardiac arrest  HPI: Patient is a 68 year old male with history of hypertension, hyperlipidemia, multiple myeloma on Revlimid who presents to the emergency department with EMS.  EMS reports that they were initially called out to the house for back pain.  Patient was complaining of chest pain, back pain, abdominal pain, hip pain.  They state that when they were trying to get him out to the ambulance he appeared dusky, diaphoretic and became less responsive.  He then became cyanotic and seemed to lose pulses.  They did CPR for approximately 30 to 60 seconds and then patient regained consciousness and had palpable pulses.  They did bag patient briefly and states that this seemed to improve his color.  No medications given in route with EMS.  They were unable to obtain a blood pressure.  Patient is very confused here in the ED.  ROS: Level 5 caveat for altered mental status  PAST MEDICAL HISTORY/PAST SURGICAL HISTORY:  Past Medical History:  Diagnosis Date  . Arthritis    neck and back pain  . Cholelithiases   . Chronic kidney disease    MILD,CHRONIC  . Hyperlipidemia   . Hypertension   . Multiple myeloma ([place])   . 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 Authorizing Provider  acetaminophen-codeine (TYLENOL #3) 300-30 MG tablet Take 1 tablet by mouth every 8 (eight) hours as needed for severe pain. 10/13/18   Truitt Merle, MD  aspirin 81 MG chewable tablet Chew 1 tablet (81 mg total) by mouth daily. 05/01/15   Caren Griffins, MD  calcium carbonate (OS-CAL) 600 MG TABS tablet Take 1,200 mg by mouth at bedtime.     [provider]  Cholecalciferol (VITAMIN D3) 1000 units CAPS Take 2,000 mg by mouth daily.    [provider]  losartan (COZAAR) 25 MG tablet Take 1 tablet (25 mg total) by mouth daily. 01/12/19 04/12/19  Hilty, Nadean Corwin, MD  Multiple  Vitamins-Minerals (CENTRUM SILVER ULTRA MENS PO) Take 1 tablet by mouth at bedtime.     [provider]  naftifine (NAFTIN) 1 % cream Apply 1 application topically daily as needed.    [provider]  nitroGLYCERIN (NITROSTAT) 0.4 MG SL tablet Place 0.4 mg under the tongue every 5 (five) minutes x 3 doses as needed for chest pain. Reported on 05/19/2016    [provider]  ondansetron (ZOFRAN) 4 MG tablet TAKE 1 TABLET FOUR TIMES A DAY AS NEEDED FOR NAUSEA OR VOMITING 12/08/18   Truitt Merle, MD  potassium chloride (MICRO-K) 10 MEQ CR capsule TAKE 3 CAPSULES DAILY 11/08/18   Truitt Merle, MD  ranitidine (ZANTAC) 150 MG tablet Take 2 tablets (300 mg total) by mouth at bedtime. 05/24/15   Truitt Merle, MD  REVLIMID 5 MG capsule Take 1 capsule (5 mg total) by mouth as directed. Take 5 mg daily for 21 days, rest 7 days. 12/30/18   Truitt Merle, MD  simvastatin (ZOCOR) 20 MG tablet Take 20 mg by mouth daily.    [provider]  vitamin B-12 (CYANOCOBALAMIN) 500 MCG tablet Take 1 tablet by mouth daily.    [provider]  zolpidem (AMBIEN) 5 MG tablet Take 1 tablet (5 mg total) by mouth at bedtime as needed for sleep. 04/26/17   Theodis Blaze, MD    ALLERGIES:  Allergies  Allergen Reactions  . Hydrochlorothiazide Rash  and Palpitations  . Asa [Aspirin] Nausea Only  . Calcium Carbonate Nausea And Vomiting  . Oxycodone Other (See Comments)    Patient does not want to take. Severe Hallucinations.  . Amoxicillin Itching and Rash  . Indapamide Palpitations    Lightheadedness, dizziness    SOCIAL HISTORY:  Social History   Tobacco Use  . Smoking status: Former Smoker    Types: Cigars    Last attempt to quit: 12/29/2003    Years since quitting: 15.0  . Smokeless tobacco: Former Systems developer    Types: Chew    Quit date: 12/28/1994  Substance Use Topics  . Alcohol use: No    Comment: occasional beer    FAMILY HISTORY: Family History  Problem Relation Age of Onset  .  Cancer Mother 39       cervical cancer   . Cancer Father        unknown cancer   . Hypertension Sister   . Cancer Sister        melanoma     EXAM: BP (!) 78/46   Pulse 65   Temp 97.9 F (36.6 C) (Rectal)   Resp 18   Ht '5\' 10"'  (1.778 m)   Wt 59 kg   SpO2 100%   BMI 18.65 kg/m  CONSTITUTIONAL: Patient is awake but confused, combative.  He is oriented to person and year but not time or situation. HEAD: Normocephalic, has an abrasion to his nose EYES: Conjunctivae clear, pupils appear equal, EOMI ENT: normal nose; dry mucous membranes NECK: Supple, no meningismus, no nuchal rigidity, no LAD  CARD: RRR; S1 and S2 appreciated; no murmurs, no clicks, no rubs, no gallops RESP: Normal chest excursion without splinting or tachypnea; breath sounds clear and equal bilaterally; no wheezes, no rhonchi, no rales, no hypoxia or respiratory distress, speaking full sentences ABD/GI: Normal bowel sounds; non-distended; soft, mildly tender to palpation diffusely, no rebound, no guarding, no peritoneal signs, no hepatosplenomegaly GU: No genital lesions, uncircumcised male BACK:  The back appears normal and is non-tender to palpation, there is no CVA tenderness but no midline spinal tenderness or step-off or deformity EXT: Normal ROM in all joints; non-tender to palpation; no edema; normal capillary refill; no cyanosis, no calf tenderness or swelling    SKIN: Normal color for age and race; warm; no rash NEURO: Moves all extremities equally, mild dysarthria but this may be from dry mucous membranes PSYCH: Patient extremely agitated and difficult to redirect  MEDICAL DECISION MAKING: Patient here with complaints of chest pain, back pain, abdominal pain.  Had brief episode of cardiac arrest per EMS which may have been related to his respiratory status.  He is not hypoxic here however and his lungs are clear.  Patient is extremely agitated and confused here.  Difficult to obtain history.  Blood sugar is  normal.  His blood pressure is a 45/20.  Hypotension and poor perfusion is likely the cause of his encephalopathy.  Rectal temperature 97.9.  Will start code sepsis work-up and treatment in the ED.  He does appear very dry on exam and is dry heaving.  Reports he has been vomiting for 2 days.  Will give IV hydration.  ED PROGRESS: Blood pressure slowly improving with IV hydration but still in the 83J to 82N systolic with maps in the 50s despite 4 L of IV fluids.  Will start Levophed.  Multiple peripheral IVs obtained but he is extremely agitated at this time I am unable to safely perform a  central line.  His wife is now at the bedside reports that he has been complaining of vomiting although she has not seen any over the past couple of days.  She reports that he had a syncopal event yesterday and that is how he obtained the abrasion to his nose.  It does not appear he is on any anticoagulation but does take aspirin daily.  CT of the head and cervical spine are pending.  Labs show normal hemoglobin here.  He has a potassium less than 2 and a creatinine of 2.4.  I would like to perform a CTA of his chest, abdomen pelvis but I feel the risk of putting him on dialysis is too high.  I have performed a bedside ultrasound that shows decent ejection fraction with no pericardial effusion, no free fluid in the abdomen, normal-appearing aorta.  Given patient extremely hypotensive despite IV fluids, will start Levophed.  Broad-spectrum antibiotics have been ordered.  IV potassium replacement has been ordered.  EKG shows prolonged QT interval and diffuse ST depression consistent with hypokalemia.  Magnesium level normal.  Troponin is negative.   Blood pressure improving with Levophed but he is still extremely agitated, encephalopathic.  No history of alcohol or drug abuse per his wife who is now at bedside.  No history of hepatic encephalopathy.  Will add on ammonia level.  Given IM Haldol without any change.  Decision  made to intubate patient given his agitation and need to safely treat patient in the ED under significant sedation.  Wife is comfortable with this plan.  This time he is a full code.  Discussed with Dr. Oletta Darter with critical care for admission.  Ammonia level is normal.  Alcohol level negative.  Chest x-ray shows endotracheal tube is in good position.  Questionable infiltrate in left lower lung base.  Normal-appearing mediastinum.   4:40 AM  Patient had brief episode of sinus bradycardia into the 30s.  He is receiving IV potassium.  Pads in place.  Heart rate quickly improved into the 60s.  5:30 AM  Pt's CT head and cervical spine showed no acute abnormality.  CT of the abdomen shows aspiration versus pneumonia in the left greater than right lung base.  He has bilateral hydronephrosis and hydroureter with an over distended bladder.  Will place Foley catheter.  No ureterolithiasis noted.  He has cholelithiasis without signs of cholecystitis.  He has diploic appendagitis that appears chronic.  Status post appendectomy.   I reviewed all nursing notes, vitals, pertinent previous records, EKGs, lab and urine results, imaging (as available).    EKG Interpretation  Date/Time:  Monday January 23 2019 03:09:21 EST Ventricular Rate:  51 PR Interval:    QRS Duration: 102 QT Interval:  612 QTC Calculation: 564 R Axis:   76 Text Interpretation:  Sinus rhythm Atrial premature complex Consider anterolateral infarct Repol abnrm, severe global ischemia (LM/MVD) Prolonged QT interval Confirmed by Pryor Curia [phone]) on 01/20/2019 4:32:12 AM        CRITICAL CARE Performed by: Cyril Mourning    Total critical care time: 85 minutes  Critical care time was exclusive of separately billable procedures and treating other patients.  Critical care was necessary to treat or prevent imminent or life-threatening deterioration.  Critical care was time spent personally by me on the following activities:  development of treatment plan with patient and/or surrogate as well as nursing, discussions with consultants, evaluation of patient's response to treatment, examination of patient, obtaining history from patient or surrogate, ordering  and performing treatments and interventions, ordering and review of laboratory studies, ordering and review of radiographic studies, pulse oximetry and re-evaluation of patient's condition.    Procedure Name: Intubation Date/Time: 01/22/2019 3:30 AM Performed by: , Delice Bison, DO Pre-anesthesia Checklist: Patient identified, Patient being monitored, Emergency Drugs available, Timeout performed and Suction available Oxygen Delivery Method: Ambu bag Preoxygenation: Pre-oxygenation with 100% oxygen Induction Type: Rapid sequence Ventilation: Mask ventilation without difficulty Laryngoscope Size: Glidescope and 3 Grade View: Grade II Tube size: 7.5 mm Number of attempts: 1 Placement Confirmation: ETT inserted through vocal cords under direct vision,  CO2 detector and Breath sounds checked- equal and bilateral Secured at: 22 cm Tube secured with: ETT holder     .Central Line Date/Time: 01/14/2019 6:26 AM Performed by: , Delice Bison, DO Authorized by: , Delice Bison, DO   Consent:    Consent obtained:  Emergent situation   Consent given by:  Spouse   Alternatives discussed:  No treatment and alternative treatment Pre-procedure details:    Hand hygiene: Hand hygiene performed prior to insertion     Sterile barrier technique: All elements of maximal sterile technique followed     Skin preparation:  ChloraPrep   Skin preparation agent: Skin preparation agent completely dried prior to procedure   Sedation:    Sedation type:  Deep Anesthesia (see MAR for exact dosages):    Anesthesia method:  None Procedure details:    Location:  R femoral   Site selection rationale:  Emergent   Patient position:  Flat   Procedural supplies:  Triple lumen    Catheter size:  8.5 Fr   Landmarks identified: yes     Ultrasound guidance: no     Number of attempts:  1   Successful placement: yes   Post-procedure details:    Post-procedure:  Dressing applied and line sutured   Assessment:  Blood return through all ports and free fluid flow   Patient tolerance of procedure:  Tolerated well, no immediate complications      , Delice Bison, DO 01/22/2019 [id number]

## 2019-01-24 ENCOUNTER — Inpatient Hospital Stay (HOSPITAL_COMMUNITY): Payer: Medicare Other

## 2019-01-24 DIAGNOSIS — G9340 Encephalopathy, unspecified: Secondary | ICD-10-CM

## 2019-01-24 DIAGNOSIS — R579 Shock, unspecified: Secondary | ICD-10-CM

## 2019-01-24 DIAGNOSIS — J189 Pneumonia, unspecified organism: Secondary | ICD-10-CM

## 2019-01-24 LAB — BASIC METABOLIC PANEL
Anion gap: 4 — ABNORMAL LOW (ref 5–15)
Anion gap: 8 (ref 5–15)
BUN: 7 mg/dL — ABNORMAL LOW (ref 8–23)
BUN: 9 mg/dL (ref 8–23)
CO2: 17 mmol/L — ABNORMAL LOW (ref 22–32)
CO2: 19 mmol/L — ABNORMAL LOW (ref 22–32)
Calcium: 6.8 mg/dL — ABNORMAL LOW (ref 8.9–10.3)
Calcium: 7.5 mg/dL — ABNORMAL LOW (ref 8.9–10.3)
Chloride: 125 mmol/L — ABNORMAL HIGH (ref 98–111)
Chloride: 126 mmol/L — ABNORMAL HIGH (ref 98–111)
Creatinine, Ser: 1.45 mg/dL — ABNORMAL HIGH (ref 0.61–1.24)
Creatinine, Ser: 2.08 mg/dL — ABNORMAL HIGH (ref 0.61–1.24)
GFR calc Af Amer: 37 mL/min — ABNORMAL LOW (ref >60)
GFR calc Af Amer: 57 mL/min — ABNORMAL LOW (ref >60)
GFR calc non Af Amer: 32 mL/min — ABNORMAL LOW (ref >60)
GFR calc non Af Amer: 49 mL/min — ABNORMAL LOW (ref >60)
GLUCOSE: 142 mg/dL — AB (ref 70–99)
Glucose, Bld: 138 mg/dL — ABNORMAL HIGH (ref 70–99)
Potassium: 2.4 mmol/L — CL (ref 3.5–5.1)
Potassium: 2.9 mmol/L — ABNORMAL LOW (ref 3.5–5.1)
Sodium: 147 mmol/L — ABNORMAL HIGH (ref 135–145)
Sodium: 152 mmol/L — ABNORMAL HIGH (ref 135–145)

## 2019-01-24 LAB — COMPREHENSIVE METABOLIC PANEL
ALT: 35 U/L (ref 0–44)
AST: 30 U/L (ref 15–41)
Albumin: 2.1 g/dL — ABNORMAL LOW (ref 3.5–5.0)
Alkaline Phosphatase: 88 U/L (ref 38–126)
Anion gap: 10 (ref 5–15)
BUN: 10 mg/dL (ref 8–23)
CO2: 16 mmol/L — ABNORMAL LOW (ref 22–32)
Calcium: 7 mg/dL — ABNORMAL LOW (ref 8.9–10.3)
Chloride: 125 mmol/L — ABNORMAL HIGH (ref 98–111)
Creatinine, Ser: 1.73 mg/dL — ABNORMAL HIGH (ref 0.61–1.24)
GFR calc Af Amer: 46 mL/min — ABNORMAL LOW (ref >60)
GFR calc non Af Amer: 39 mL/min — ABNORMAL LOW (ref >60)
GLUCOSE: 125 mg/dL — AB (ref 70–99)
Potassium: 3.1 mmol/L — ABNORMAL LOW (ref 3.5–5.1)
Sodium: 151 mmol/L — ABNORMAL HIGH (ref 135–145)
Total Bilirubin: 0.9 mg/dL (ref 0.3–1.2)
Total Protein: 4.7 g/dL — ABNORMAL LOW (ref 6.5–8.1)

## 2019-01-24 LAB — POCT I-STAT 7, (LYTES, BLD GAS, ICA,H+H)
Acid-base deficit: 9 mmol/L — ABNORMAL HIGH (ref 0.0–2.0)
BICARBONATE: 16.6 mmol/L — AB (ref 20.0–28.0)
Calcium, Ion: 1.04 mmol/L — ABNORMAL LOW (ref 1.15–1.40)
HEMATOCRIT: 38 % — AB (ref 39.0–52.0)
HEMOGLOBIN: 12.9 g/dL — AB (ref 13.0–17.0)
O2 SAT: 98 %
Patient temperature: 98.1
Potassium: 2.3 mmol/L — CL (ref 3.5–5.1)
Sodium: 155 mmol/L — ABNORMAL HIGH (ref 135–145)
TCO2: 18 mmol/L — ABNORMAL LOW (ref 22–32)
pCO2 arterial: 35.8 mmHg (ref 32.0–48.0)
pH, Arterial: 7.274 — ABNORMAL LOW (ref 7.350–7.450)
pO2, Arterial: 108 mmHg (ref 83.0–108.0)

## 2019-01-24 LAB — CBC
HCT: 44.6 % (ref 39.0–52.0)
Hemoglobin: 14.9 g/dL (ref 13.0–17.0)
MCH: 33.7 pg (ref 26.0–34.0)
MCHC: 33.4 g/dL (ref 30.0–36.0)
MCV: 100.9 fL — ABNORMAL HIGH (ref 80.0–100.0)
Platelets: 154 10*3/uL (ref 150–400)
RBC: 4.42 MIL/uL (ref 4.22–5.81)
RDW: 14.1 % (ref 11.5–15.5)
WBC: 7.1 10*3/uL (ref 4.0–10.5)
nRBC: 0 % (ref 0.0–0.2)

## 2019-01-24 LAB — GLUCOSE, CAPILLARY
Glucose-Capillary: 115 mg/dL — ABNORMAL HIGH (ref 70–99)
Glucose-Capillary: 55 mg/dL — ABNORMAL LOW (ref 70–99)
Glucose-Capillary: 70 mg/dL (ref 70–99)
Glucose-Capillary: 118 mg/dL — ABNORMAL HIGH (ref 70–99)
Glucose-Capillary: 123 mg/dL — ABNORMAL HIGH (ref 70–99)
Glucose-Capillary: 71 mg/dL (ref 70–99)
Glucose-Capillary: 110 mg/dL — ABNORMAL HIGH (ref 70–99)

## 2019-01-24 LAB — TROPONIN I
Troponin I: 0.22 ng/mL (ref <0.03)
Troponin I: 0.35 ng/mL (ref <0.03)
Troponin I: 0.48 ng/mL (ref <0.03)

## 2019-01-24 LAB — ECHOCARDIOGRAM COMPLETE
Height: 70 in
Weight: 2261.04 oz

## 2019-01-24 LAB — MAGNESIUM
Magnesium: 1.7 mg/dL (ref 1.7–2.4)
Magnesium: 1.9 mg/dL (ref 1.7–2.4)

## 2019-01-24 LAB — URINE CULTURE: Culture: NO GROWTH

## 2019-01-24 MED ORDER — POTASSIUM CHLORIDE 10 MEQ/100ML IV SOLN
10.0000 meq | INTRAVENOUS | Status: AC
  Administered 2019-01-24 – 2019-01-25 (×6): 10 meq via INTRAVENOUS
  Filled 2019-01-24 (×6): qty 100

## 2019-01-24 MED ORDER — FREE WATER
200.0000 mL | Status: DC
  Administered 2019-01-25 – 2019-01-27 (×10): 200 mL

## 2019-01-24 MED ORDER — MAGNESIUM SULFATE 2 GM/50ML IV SOLN
2.0000 g | Freq: Once | INTRAVENOUS | Status: AC
  Administered 2019-01-24: 2 g via INTRAVENOUS
  Filled 2019-01-24: qty 50

## 2019-01-24 MED ORDER — POTASSIUM CHLORIDE 10 MEQ/50ML IV SOLN
10.0000 meq | INTRAVENOUS | Status: AC
  Administered 2019-01-24 (×6): 10 meq via INTRAVENOUS
  Filled 2019-01-24 (×6): qty 50

## 2019-01-24 MED ORDER — DEXTROSE 50 % IV SOLN
INTRAVENOUS | Status: AC
  Administered 2019-01-24: 25 mL
  Filled 2019-01-24: qty 50

## 2019-01-24 MED ORDER — DEXTROSE 5 % IV SOLN
INTRAVENOUS | Status: DC
  Administered 2019-01-24 – 2019-01-25 (×2): via INTRAVENOUS

## 2019-01-24 NOTE — Progress Notes (Signed)
Telephone note  Pt's wife called me today and informed me about his hospital admission.  I have reviewed his chart and discussed with his wife.  I agree with holding Revlimid for now. Pt is immunocompromised due to Revlimid.  He has multiple myeloma has been in remission, he is on maintenance therapy to prevent recurrence.   I will see him in the hospital in a few days.   Truitt Merle  01/24/2019

## 2019-01-24 NOTE — Progress Notes (Signed)
Arterial line attempted by 2 different RT's with not success.  RN and NP notified.

## 2019-01-24 NOTE — Procedures (Signed)
Arterial Catheter Insertion Procedure Note STYLIANOS STRADLING [id number] [birth year]  Procedure: Insertion of Arterial Catheter  Indications: Blood pressure monitoring and Frequent blood sampling  Procedure Details Consent: Unable to obtain consent because of altered level of consciousness. Time Out: Verified patient identification, verified procedure, site/side was marked, verified correct patient position, special equipment/implants available, medications/allergies/relevent history reviewed, required imaging and test results available.  Performed  Maximum sterile technique was used including antiseptics, cap, gloves, gown, hand hygiene, mask and sheet. Skin prep: Chlorhexidine; local anesthetic administered 20 gauge catheter was inserted into left femoral artery using the Seldinger technique. ULTRASOUND GUIDANCE USED: YES Evaluation Blood flow good; BP tracing good. Complications: No apparent complications.   Georgann Housekeeper, AGACNP-BC [place] Pager [phone] or [phone]  01/24/2019 6:40 AM

## 2019-01-24 NOTE — Consult Note (Signed)
[place] Nurse wound consult note Reason for Consult:Consult requested for bilat arms.  They are covered in ace wraps; removed to assess.  Pt has very thin fragile skin and there are multiple areas of dark purple-red bruising to left arm.  Right arm has patchy scattered areas dry red of abrasions.  There are no full thickness wounds, bleeding, or drainage requiring topical treatment at this time.  If is occurs, foam dressings may be applied to protect and promote healing.  No family at the bedside and patient is critically ill and not very responsive. Please re-consult if further assistance is needed.  Thank-you,  Julien Girt MSN, [place], [place], [place], [place]

## 2019-01-24 NOTE — Progress Notes (Signed)
NAME:  Eric Dudley, MRN:  [id number], DOB:  01-16-1951, LOS: 1 ADMISSION DATE:  01/03/2019, CONSULTATION DATE:  01/01/2019 REFERRING MD:  Dr. Leonides Schanz, CHIEF COMPLAINT:  AMS/ shock  Brief History   68 yoM with recent URI symptoms, not eating well, drinking well with 3 day hx of dizziness, syncopal episode, and ?vomiting.  Brief arrest vs syncopal episode with EMS with < 60 sec CPR.  In ER, remained hypotensive and agitated, requiring intubation for airway protection.  Required pressors for hypotension despite IVF, found to have AKI, hypokalemia, and LLL PNA.  Started empiric abx.  PCCM to admit.  History of present illness   HPI obtained from medical chart and by conversation with wife as patient is sedated and intubated on mechanical ventilation.   68 year old male with history of nonsmoker, Multiple myeloma (dx 2016, in remission on Revlimid), HTN, HLD, previous NICM with improved EF on recent TTE to 65%, CAD, hx tonsillar cancer.  Wife reports patient has had a cold for the last two weeks after catching it from her.  He recently went to doctor and placed on antibiotics as his symptoms and cough did not improve. States he has not ate well since but has been drinking well.  3 days ago, patient developed dizziness and had syncopal episode at home.  He continued to feel back and fell again last night. He starting complaining of leg and back pain and said he had been vomiting, although wife has not witnessed any vomiting.   Wife called EMS and had brief episode of apnea and pulseless and received CPR for < 60 secs prior to patient becoming conscious during transport.  In ER, patient remained confused and agitated.  He was intubated for airway protection.  He was profoundly hypotensive, despite 4L IVF requiring vasopressors.  He was mildly hypothermic, not hypoxic, and bradycardic with several pauses into the 30's while in ER.  CT head and cervical neg, CT abd performed showing left lower lobe pneumoina and  mild hydronephrosis and distended bladder.  Labs showed Lactic 6.9-> 4.6, K 2.2, sCr 2.65, WBC 6.9, mild transaminitis, UA pending.  EKG SB, with initial diffuse STD, no STE and prolonged QTc.  Started on empiric antibiotics and PCCM called for admission.   Past Medical History  Multiple myeloma (dx 2016, in remission on Revlimid), HTN, HLD, non smoker, previous NICM with improved EF on recent TTE to 65%, CAD, hx tonsillar cancer  Significant Hospital Events   1/27 Admit  Consults:   Procedures:  1/27 ETT >> 1/27 R femoral TL CVL >> 1/27 foley >>  Significant Diagnostic Tests:  01/22/2019 CTH/ cervical  >> 1. No acute intracranial abnormality. 2. Diffuse bone marrow disease consistent with multiple myeloma.  12/28/2018 CT abd/ pelvis >> 1. Aspiration or pneumonia in the left more than right lung base. 2. Bilateral hydronephrosis and hydroureter above an over distended bladder. 3. Nephrolithiasis and cholelithiasis. 4. Sigmoid epiploic appendagitis of indeterminate chronicity.  Micro Data:  1/27 BCx 2 >> no growth in 24-hour 1/27 urine cx >> no growth 1/27 Resp cx  >> gram-positive rods on prelim. 1/27 MRSA PCR >> negative  Antimicrobials:  1/27 vanc x 1 1/27 flagyl x 1 1/27 aztreonam x 1 1/27 azithromax  1/27 ceftriaxone >>  Interim history/subjective:  No overnight concerns per nursing staff, still on 2 pressors.  Objective   Blood pressure (!) 106/46, pulse 66, temperature 98.2 F (36.8 C), resp. rate 16, height '5\' 10"'  (1.778 m), weight  64.1 kg, SpO2 99 %.    Vent Mode: PRVC FiO2 (%):  [30 %] 30 % Set Rate:  [16 bmp] 16 bmp Vt Set:  [510 mL] 510 mL PEEP:  [5 cmH20] 5 cmH20 Plateau Pressure:  [13 cmH20-20 cmH20] 15 cmH20   Intake/Output Summary (Last 24 hours) at 01/24/2019 1338 Last data filed at 01/24/2019 1328 Gross per 24 hour  Intake 6254.34 ml  Output 6459 ml  Net -204.66 ml   Filed Weights   01/13/2019 0303 01/18/2019 0900 01/24/19 0500  Weight: 59 kg  61.7 kg 64.1 kg    Examination: General: Thin critically ill adult male sedated, on vent. HEENT: MM pink/dry, pupils 3/reactive, anicteric, ETT , no JVD, abrasion to nasal bridge and right forehead Neuro: sedated, not following any commands. CV: Regular rate and rhythm with very distant heart sounds. PULM: Clear bilaterally with decreased breath sound at bases. GI: soft, non-tender, bs active, foley w/cyu Extremities: Relatively cool lower extremities with no edema or cyanosis. Skin: no rashes, bilateral arms with bruising and wrapped in ACE bandages  Resolved Hospital Problem list    Assessment & Plan:  Shock- presumed septic, vs hypovolemia from dehydration vs cardiogenic  Lactic acidosis resolved, continue to require higher doses of pressors. P:  Goal MAP >65 Continue levophed, and vasopressin. Continue IVF-with D5 because of hypernatremia. DC Versed infusion and increase fentanyl if needed with Precedex. Repeat echo done this morning-read pending.  Acute respiratory failure LLL infiltrate (immunosuppressed) P:  Full MV support. Wean FiO2 for sats > 92-99% Repeat ABG now  VAP measures pepcid for SUP Continue with azithro and ceftriaxone.  Brief Cardiac arrest vs syncope episode with <60 sec CPR 1/27 - wife reports syncope episode x 2 at home with dizziness  Bradycardia- with pauses into the 30's Prolonged QTc- 0.559 Elevated Troponin-peaked at 0.43 now trending down. - EKG- no STE, improving diffuse STD, SB - per cards notes, previously on midodrine for orthostatic hypotension since off P:  Tele monitor for ongoing arrhthymias/ ectopy/ pauses Echo done-read pending. Consider Cardiology consult after getting echo results. Goal K >2, mag >2 Wife wishes for no more CPR   Electrolyte abnormalities.  Persistent hypokalemia with hypernatremia. Free water deficit of 2.7L. -Replete potassium and magnesium. -Switch IV fluids with D5. -Monitor BMP.  Acute  encephalopathy likely related to hypotension -CTH/ cervical neg P:  Monitor Ongoing neuro checks PAD protocol with fentanyl and Precedex w/ bowel regimen Daily WUA RASS goal 0/-1  AKI on CKD2 Bilateral mild hydronephrosis with distended bladder- s/p foley with ~1.2 L output. Renal ultrasound with no hydronephrosis.  Some improvement in creatinine, at 2.08 with baseline of 1.2. P:  Continue foley given critical illness Check urine Cr/ Na Strict I/O's, daily wts, UOP trend Repeat renal function in am.  Multiple myeloma - in remission P:  Hold revlimid   Hx HTN, HLD P:  Hold zocor, losartan Continue daily ASA 85m  Hyperglycemia P:  CBG a 4, add SSI if > 180  Mild Transaminitis - CT abd without acute process P:  Repeat LFTs in am    Best practice:  Diet: NPO Pain/Anxiety/Delirium protocol (if indicated): fentanyl /Precedex VAP protocol (if indicated): yes DVT prophylaxis: SCDs/ heparin GI prophylaxis: pepcid Glucose control: CBG q 4 Mobility: BR Code Status: After lengthly discussion with wife, she states patient has recently told her he was so tired of feeling bad and didn't want to do this anymore.  She states he would not want this but is  agreeable to continue short term support for now.  She states no CPR if despite all current aggressive measures he were to decompensate to this.  Orders change to reflect this  Family Communication:  Disposition: admit ICU  Labs   CBC: Recent Labs  Lab 01/13/2019 0249 01/12/2019 0255 01/21/2019 0306 01/24/19 0420 01/24/19 1145  WBC  --  6.9  --  7.1  --   NEUTROABS  --  5.0  --   --   --   HGB 17.0 15.5 15.6 14.9 12.9*  HCT 50.0 44.6 46.0 44.6 38.0*  MCV  --  95.7  --  100.9*  --   PLT  --  191  --  154  --     Basic Metabolic Panel: Recent Labs  Lab 01/15/2019 0255 01/22/2019 0306 01/24/2019 0340 01/14/2019 1200 01/02/2019 2342 01/24/19 0745 01/24/19 1145  NA 130* 129*  --  142 152* 151* 155*  K 2.2* <2.0*  --  3.0*  2.4* 3.1* 2.3*  CL 92*  --   --  118* 125* 125*  --   CO2 20*  --   --  13* 19* 16*  --   GLUCOSE 165*  --   --  150* 142* 125*  --   BUN 12  --   --  '11 9 10  ' --   CREATININE 2.65*  --  2.40* 2.04* 2.08* 1.73*  --   CALCIUM 8.7*  --   --  7.3* 7.5* 7.0*  --   MG 1.8  --   --   --  1.9 1.7  --    GFR: Estimated Creatinine Clearance: 36.5 mL/min (A) (by C-G formula based on SCr of 1.73 mg/dL (H)). Recent Labs  Lab 01/11/2019 0255 01/05/2019 0500 01/16/2019 0736 01/24/19 0420  WBC 6.9  --   --  7.1  LATICACIDVEN 6.9* 4.6* 1.7  --     Liver Function Tests: Recent Labs  Lab 01/22/2019 0255 01/24/19 0745  AST 43* 30  ALT 46* 35  ALKPHOS 122 88  BILITOT 1.7* 0.9  PROT 5.7* 4.7*  ALBUMIN 3.0* 2.1*   Recent Labs  Lab 01/19/2019 0255  LIPASE 32   Recent Labs  Lab 01/01/2019 0302  AMMONIA 25    ABG    Component Value Date/Time   PHART 7.274 (L) 01/24/2019 1145   PCO2ART 35.8 01/24/2019 1145   PO2ART 108.0 01/24/2019 1145   HCO3 16.6 (L) 01/24/2019 1145   TCO2 18 (L) 01/24/2019 1145   ACIDBASEDEF 9.0 (H) 01/24/2019 1145   O2SAT 98.0 01/24/2019 1145     Coagulation Profile: No results for input(s): INR, PROTIME in the last 168 hours.  Cardiac Enzymes: Recent Labs  Lab 01/22/2019 0255 01/20/2019 0736 01/26/2019 1200 01/21/2019 1926 01/24/19 0745  TROPONINI 0.05* 0.09* 0.20* 0.43* 0.22*    HbA1C: No results found for: HGBA1C  CBG: Recent Labs  Lab 01/26/2019 2340 01/24/19 0432 01/24/19 0747 01/24/19 0827 01/24/19 1309  GLUCAP 120* 115* 55* 70 118*    Review of Systems:   Unable to obtain  Past Medical History  He,  has a past medical history of Arthritis, Cholelithiases, Chronic kidney disease, Hyperlipidemia, Hypertension, Multiple myeloma ([place]), and Tonsillar cancer ([place]) (2006).   Surgical History    Past Surgical History:  Procedure Laterality Date  . APPENDECTOMY  1962  . CARDIAC CATHETERIZATION N/A 04/30/2015   Procedure: Left Heart Cath and Coronary  Angiography;  Surgeon: Peter M Martinique, MD;  Location: [facility] INVASIVE CV  LAB CUPID;  Service: Cardiovascular;  Laterality: N/A;  . CARDIOLITE MYOCARDIAL PERFUSION STUDY  04/16/03   NEGITIVE BRUCE PROTOCAL EXERCISE STRESS TEST. EF 70%. NO ISCHEMIA.  [name] KNEE SURGERY  2012   right  . [place]   left     Social History   reports that he quit smoking about 15 years ago. His smoking use included cigars. He quit smokeless tobacco use about 24 years ago.  His smokeless tobacco use included chew. He reports that he does not drink alcohol or use drugs.   Family History   His family history includes Cancer in his father and sister; Cancer (age of onset: 80) in his mother; Hypertension in his sister.   Allergies Allergies  Allergen Reactions  . Hydrochlorothiazide Rash and Palpitations  . Asa [Aspirin] Nausea Only  . Calcium Carbonate Nausea And Vomiting  . Oxycodone Other (See Comments)    Patient does not want to take. Severe Hallucinations.  . Amoxicillin Itching and Rash    Did it involve swelling of the face/tongue/throat, SOB, or low BP? Unknown Did it involve sudden or severe rash/hives, skin peeling, or any reaction on the inside of your mouth or nose? Unknown Did you need to seek medical attention at a hospital or doctor's office? Unknown When did it last happen? Unk If all above answers are "NO", may proceed with cephalosporin use.   . Indapamide Palpitations    Lightheadedness, dizziness     Home Medications  Prior to Admission medications   Medication Sig Start Date End Date Taking? Authorizing Provider  acetaminophen-codeine (TYLENOL #3) 300-30 MG tablet Take 1 tablet by mouth every 8 (eight) hours as needed for severe pain. 10/13/18   Truitt Merle, MD  aspirin 81 MG chewable tablet Chew 1 tablet (81 mg total) by mouth daily. 05/01/15   Caren Griffins, MD  calcium carbonate (OS-CAL) 600 MG TABS tablet Take 1,200 mg by mouth at bedtime.     [provider]  Cholecalciferol (VITAMIN D3) 1000 units CAPS Take 2,000 mg by mouth daily.    [provider]  losartan (COZAAR) 25 MG tablet Take 1 tablet (25 mg total) by mouth daily. 01/12/19 04/12/19  Hilty, Nadean Corwin, MD  Multiple Vitamins-Minerals (CENTRUM SILVER ULTRA MENS PO) Take 1 tablet by mouth at bedtime.     [provider]  naftifine (NAFTIN) 1 % cream Apply 1 application topically daily as needed.    [provider]  nitroGLYCERIN (NITROSTAT) 0.4 MG SL tablet Place 0.4 mg under the tongue every 5 (five) minutes x 3 doses as needed for chest pain. Reported on 05/19/2016    [provider]  ondansetron (ZOFRAN) 4 MG tablet TAKE 1 TABLET FOUR TIMES A DAY AS NEEDED FOR NAUSEA OR VOMITING 12/08/18   Truitt Merle, MD  potassium chloride (MICRO-K) 10 MEQ CR capsule TAKE 3 CAPSULES DAILY 11/08/18   Truitt Merle, MD  ranitidine (ZANTAC) 150 MG tablet Take 2 tablets (300 mg total) by mouth at bedtime. 05/24/15   Truitt Merle, MD  REVLIMID 5 MG capsule Take 1 capsule (5 mg total) by mouth as directed. Take 5 mg daily for 21 days, rest 7 days. 12/30/18   Truitt Merle, MD  simvastatin (ZOCOR) 20 MG tablet Take 20 mg by mouth daily.    [provider]  vitamin B-12 (CYANOCOBALAMIN) 500 MCG tablet Take 1 tablet by mouth daily.    [provider]  zolpidem (AMBIEN) 5 MG tablet Take 1  tablet (5 mg total) by mouth at bedtime as needed for sleep. 04/26/17   Theodis Blaze, MD         Lorella Nimrod MD PGY3 Pager [phone] 01/24/2019, 1:38 PM

## 2019-01-24 NOTE — Progress Notes (Signed)
CRITICAL VALUE ALERT  Critical Value: Potassium   Date & Time Notied: 01/24/19 0018  Provider Notified: Warren Lacy RN   Orders Received/Actions taken: Waiting for orders

## 2019-01-24 NOTE — Progress Notes (Signed)
Initial Nutrition Assessment  DOCUMENTATION CODES:   Severe malnutrition in context of chronic illness  INTERVENTION:   Tube Feeding:  Vital AF 1.2 at 60 ml/hr Begin at 20 ml/hr; titrate by 10 mL q 4 hours until goal rate of 60 ml/hr Provides 1728 kcals, 108 g of protein, 1210 mL of free water Meets 100% estimated protein and calorie needs  Monitor magnesium, potassium, and phosphorus for at least 5 occurences, MD to replete as needed, as pt is at risk for refeeding syndrome given prolonged poor po intake, severe malnutrition.   NUTRITION DIAGNOSIS:   Severe Malnutrition related to chronic illness(Multiple Myeloma) as evidenced by severe fat depletion, severe muscle depletion.  GOAL:   Patient will meet greater than or equal to 90% of their needs  MONITOR:   TF tolerance, Labs, Weight trends, Vent status  REASON FOR ASSESSMENT:   Ventilator    ASSESSMENT:   68 yo male admitted with shock (septic vs hypovolemic from dehydration vs cardiogenic), acute respiratory failure requiring intubation, acute encephalopathy, AKI on CKD PMH includes multiple myeloma on oral chemo, HTN, HLD, CAD, hx of tonsillar cancer  Patient is currently intubated on ventilator support, sedated on fentanyl/versed, levophed and vasopressin for pressors support MV: 7.9 L/min Temp (24hrs), Avg:98.2 F (36.8 C), Min:97.7 F (36.5 C), Max:98.6 F (37 C)  Propofol: N/A  Cortrak today (tip in distal stomach vs duodenal bulb) with initiation of TF  Wife at bedside and report pt with minimal po intake for the past 2 weeks. Wife reports pt eating bites of jello, bites of chicken soup and maybe a cracker daily for 2 weeks. Wife reports pt eating fairly well prior to this. Pt not taking any oral nutrition supplements  Current wt 65.2 kg; admission wt of 61.7 kg. Net + 3.8 L since admission. Wife reports pt UBW was 210 pounds prior to Multiple Myeloma diagnosis 3 years ago. Reports recent weight around 130  pounds. Almost 40 % wt loss   Hypoglycemia being treated with dextrose; hopefully will improve post initiation of TF   Labs: potassium 2.9 (L), CBGs 48-205, Creatinine 1.31, corrected calcium 8.4, albumin 1.9 Meds: D5 at 75 ml/hr  NUTRITION - FOCUSED PHYSICAL EXAM:    Most Recent Value  Orbital Region  Severe depletion  Upper Arm Region  Moderate depletion  Thoracic and Lumbar Region  Severe depletion  Buccal Region  Unable to assess  Temple Region  Severe depletion  Clavicle Bone Region  Severe depletion  Clavicle and Acromion Bone Region  Severe depletion  Scapular Bone Region  Severe depletion  Dorsal Hand  Unable to assess  Patellar Region  Moderate depletion  Anterior Thigh Region  Moderate depletion  Posterior Calf Region  Mild depletion  Edema (RD Assessment)  None  Hair  Reviewed  Eyes  Unable to assess  Mouth  Unable to assess  Nails  Unable to assess       Diet Order:   Diet Order            Diet NPO time specified  Diet effective now              EDUCATION NEEDS:   Not appropriate for education at this time  Skin:  Skin Assessment: Reviewed RN Assessment  Last BM:  no documented BM  Height:   Ht Readings from Last 1 Encounters:  12/31/2018 '5\' 10"'  (1.778 m)    Weight:   Wt Readings from Last 1 Encounters:  01/25/19 65.2 kg  Ideal Body Weight:  75.5 kg  BMI:  Body mass index is 20.62 kg/m.  Estimated Nutritional Needs:   Kcal:  1778 kcals   Protein:  93-124 g  Fluid:  >/= 1.7 L   Kerman Passey MS, RD, LDN, CNSC [phone] Pager  [phone] Weekend/On-Call Pager

## 2019-01-24 NOTE — Progress Notes (Signed)
Hypoglycemic Event  CBG: 55 (at 0747)  Treatment: 12.5gm D50 given IV per protocol  Symptoms: none assessed, pt intubated/sedated  Follow-up CBG: Time: 0827 CBG Result: 70  Possible Reasons for Event: pt is NPO  Comments/MD notified: MD notified     Eric Dudley  Q Eric Dudley

## 2019-01-24 NOTE — Progress Notes (Signed)
[place] Progress Note Patient Name: Eric Dudley DOB: 1951/01/07 MRN: [id number]   Date of Service  01/24/2019  HPI/Events of Note  K+ = 2.4 and Creatinine = 2.08.  eICU Interventions  Will order: 1. Replace K+.  2. Repeat BMP at 5 AM.     Intervention Category Major Interventions: Electrolyte abnormality - evaluation and management  [name] 01/24/2019, 12:45 AM

## 2019-01-24 NOTE — Progress Notes (Signed)
  Echocardiogram 2D Echocardiogram has been performed.  Eric Dudley 01/24/2019, 11:53 AM

## 2019-01-25 ENCOUNTER — Inpatient Hospital Stay (HOSPITAL_COMMUNITY): Payer: Medicare Other

## 2019-01-25 DIAGNOSIS — E43 Unspecified severe protein-calorie malnutrition: Secondary | ICD-10-CM

## 2019-01-25 DIAGNOSIS — E876 Hypokalemia: Secondary | ICD-10-CM

## 2019-01-25 DIAGNOSIS — N179 Acute kidney failure, unspecified: Secondary | ICD-10-CM

## 2019-01-25 DIAGNOSIS — R0689 Other abnormalities of breathing: Secondary | ICD-10-CM

## 2019-01-25 LAB — BASIC METABOLIC PANEL
Anion gap: 6 (ref 5–15)
BUN: 7 mg/dL — ABNORMAL LOW (ref 8–23)
CO2: 19 mmol/L — ABNORMAL LOW (ref 22–32)
Calcium: 6.8 mg/dL — ABNORMAL LOW (ref 8.9–10.3)
Chloride: 116 mmol/L — ABNORMAL HIGH (ref 98–111)
Creatinine, Ser: 1.16 mg/dL (ref 0.61–1.24)
GFR calc Af Amer: >60 mL/min (ref >60)
GFR calc non Af Amer: >60 mL/min (ref >60)
Glucose, Bld: 87 mg/dL (ref 70–99)
POTASSIUM: 3.1 mmol/L — AB (ref 3.5–5.1)
Sodium: 141 mmol/L (ref 135–145)

## 2019-01-25 LAB — GLUCOSE, CAPILLARY
GLUCOSE-CAPILLARY: 110 mg/dL — AB (ref 70–99)
GLUCOSE-CAPILLARY: 81 mg/dL (ref 70–99)
Glucose-Capillary: 104 mg/dL — ABNORMAL HIGH (ref 70–99)
Glucose-Capillary: 48 mg/dL — ABNORMAL LOW (ref 70–99)
Glucose-Capillary: 50 mg/dL — ABNORMAL LOW (ref 70–99)
Glucose-Capillary: 205 mg/dL — ABNORMAL HIGH (ref 70–99)
Glucose-Capillary: 90 mg/dL (ref 70–99)
Glucose-Capillary: 72 mg/dL (ref 70–99)

## 2019-01-25 LAB — POCT I-STAT 7, (LYTES, BLD GAS, ICA,H+H)
Acid-base deficit: 7 mmol/L — ABNORMAL HIGH (ref 0.0–2.0)
Bicarbonate: 18.3 mmol/L — ABNORMAL LOW (ref 20.0–28.0)
Calcium, Ion: 1.14 mmol/L — ABNORMAL LOW (ref 1.15–1.40)
HCT: 34 % — ABNORMAL LOW (ref 39.0–52.0)
Hemoglobin: 11.6 g/dL — ABNORMAL LOW (ref 13.0–17.0)
O2 Saturation: 98 %
PO2 ART: 118 mmHg — AB (ref 83.0–108.0)
Patient temperature: 36.5
Potassium: 3 mmol/L — ABNORMAL LOW (ref 3.5–5.1)
Sodium: 142 mmol/L (ref 135–145)
TCO2: 19 mmol/L — ABNORMAL LOW (ref 22–32)
pCO2 arterial: 36 mmHg (ref 32.0–48.0)
pH, Arterial: 7.312 — ABNORMAL LOW (ref 7.350–7.450)

## 2019-01-25 LAB — CULTURE, RESPIRATORY W GRAM STAIN

## 2019-01-25 LAB — CBC
HEMATOCRIT: 39.6 % (ref 39.0–52.0)
Hemoglobin: 13.6 g/dL (ref 13.0–17.0)
MCH: 33.9 pg (ref 26.0–34.0)
MCHC: 34.3 g/dL (ref 30.0–36.0)
MCV: 98.8 fL (ref 80.0–100.0)
Platelets: 95 10*3/uL — ABNORMAL LOW (ref 150–400)
RBC: 4.01 MIL/uL — ABNORMAL LOW (ref 4.22–5.81)
RDW: 14.3 % (ref 11.5–15.5)
WBC: 6.4 10*3/uL (ref 4.0–10.5)
nRBC: 0 % (ref 0.0–0.2)

## 2019-01-25 LAB — MAGNESIUM: Magnesium: 1.5 mg/dL — ABNORMAL LOW (ref 1.7–2.4)

## 2019-01-25 LAB — COMPREHENSIVE METABOLIC PANEL
ALT: 32 U/L (ref 0–44)
AST: 40 U/L (ref 15–41)
Albumin: 1.9 g/dL — ABNORMAL LOW (ref 3.5–5.0)
Alkaline Phosphatase: 96 U/L (ref 38–126)
Anion gap: 6 (ref 5–15)
BUN: 6 mg/dL — ABNORMAL LOW (ref 8–23)
CHLORIDE: 120 mmol/L — AB (ref 98–111)
CO2: 18 mmol/L — ABNORMAL LOW (ref 22–32)
Calcium: 6.7 mg/dL — ABNORMAL LOW (ref 8.9–10.3)
Creatinine, Ser: 1.31 mg/dL — ABNORMAL HIGH (ref 0.61–1.24)
GFR calc Af Amer: >60 mL/min (ref >60)
GFR calc non Af Amer: 55 mL/min — ABNORMAL LOW (ref >60)
Glucose, Bld: 124 mg/dL — ABNORMAL HIGH (ref 70–99)
POTASSIUM: 2.9 mmol/L — AB (ref 3.5–5.1)
Sodium: 144 mmol/L (ref 135–145)
Total Bilirubin: 0.5 mg/dL (ref 0.3–1.2)
Total Protein: 4.5 g/dL — ABNORMAL LOW (ref 6.5–8.1)

## 2019-01-25 LAB — CULTURE, RESPIRATORY

## 2019-01-25 LAB — TROPONIN I
TROPONIN I: 1.26 ng/mL — AB (ref <0.03)
Troponin I: 0.64 ng/mL (ref <0.03)
Troponin I: 0.85 ng/mL (ref <0.03)

## 2019-01-25 MED ORDER — DEXTROSE 50 % IV SOLN
INTRAVENOUS | Status: AC
Start: 2019-01-25 — End: 2019-01-25
  Administered 2019-01-25: 50 mL
  Filled 2019-01-25: qty 50

## 2019-01-25 MED ORDER — DEXTROSE 50 % IV SOLN: 50.0000 mL | Freq: Once | INTRAVENOUS | Status: AC

## 2019-01-25 MED ORDER — HEPARIN (PORCINE) 25000 UT/250ML-% IV SOLN
1000.0000 [IU]/h | INTRAVENOUS | Status: DC
  Administered 2019-01-25 – 2019-01-27 (×2): 800 [IU]/h via INTRAVENOUS
  Administered 2019-01-28: 950 [IU]/h via INTRAVENOUS
  Filled 2019-01-25 (×4): qty 250

## 2019-01-25 MED ORDER — POTASSIUM CHLORIDE 10 MEQ/50ML IV SOLN
10.0000 meq | INTRAVENOUS | Status: AC
  Administered 2019-01-25 (×8): 10 meq via INTRAVENOUS
  Filled 2019-01-25 (×6): qty 50

## 2019-01-25 MED ORDER — MIDAZOLAM HCL 2 MG/2ML IJ SOLN
1.0000 mg | INTRAMUSCULAR | Status: DC | PRN
  Administered 2019-01-25 – 2019-01-26 (×5): 2 mg via INTRAVENOUS
  Filled 2019-01-25 (×7): qty 2

## 2019-01-25 MED ORDER — MAGNESIUM SULFATE 2 GM/50ML IV SOLN
2.0000 g | Freq: Once | INTRAVENOUS | Status: AC
  Administered 2019-01-25: 2 g via INTRAVENOUS
  Filled 2019-01-25: qty 50

## 2019-01-25 MED ORDER — POTASSIUM CHLORIDE 20 MEQ/15ML (10%) PO SOLN
40.0000 meq | ORAL | Status: AC
  Administered 2019-01-25 (×2): 40 meq via ORAL
  Filled 2019-01-25 (×2): qty 30

## 2019-01-25 MED ORDER — VITAL AF 1.2 CAL PO LIQD
1000.0000 mL | ORAL | Status: DC
  Administered 2019-01-25: 1000 mL

## 2019-01-25 MED ORDER — CALCIUM GLUCONATE-NACL 1-0.675 GM/50ML-% IV SOLN
1.0000 g | Freq: Once | INTRAVENOUS | Status: AC
  Administered 2019-01-25: 1000 mg via INTRAVENOUS
  Filled 2019-01-25: qty 50

## 2019-01-25 MED ORDER — VITAL HIGH PROTEIN PO LIQD: 1000.0000 mL | ORAL | Status: DC

## 2019-01-25 MED ORDER — HEPARIN BOLUS VIA INFUSION
2000.0000 [IU] | Freq: Once | INTRAVENOUS | Status: AC
  Administered 2019-01-25: 2000 [IU] via INTRAVENOUS
  Filled 2019-01-25: qty 2000

## 2019-01-25 NOTE — Procedures (Signed)
Cortrak  Tube Type:  Cortrak - 43 inches Tube Location:  Right nare Initial Placement:  Postpyloric Secured by: Bridle Technique Used to Measure Tube Placement:  Documented cm marking at nare/ corner of mouth Cortrak Secured At:  75 cm    Cortrak Tube Team Note:  Consult received to place a Cortrak feeding tube.   X-ray is required, abdominal x-ray has been ordered by the Cortrak team. Please confirm tube placement before using the Cortrak tube.   If the tube becomes dislodged please keep the tube and contact the Cortrak team at [email or web address] ([email or web address]) for replacement.  If after hours and replacement cannot be delayed, place a NG tube and confirm placement with an abdominal x-ray.    Koleen Distance MS, RD, LDN Pager #- [phone] Office#- [phone] After Hours Pager: [phone]

## 2019-01-25 NOTE — Progress Notes (Signed)
NAME:  Eric Dudley, MRN:  [id number], DOB:  08/18/1951, LOS: 2 ADMISSION DATE:  01/20/2019, CONSULTATION DATE:  01/06/2019 REFERRING MD:  Dr. Leonides Schanz, CHIEF COMPLAINT:  AMS/ shock  Brief History   18 yoM with recent URI symptoms, not eating well, drinking well with 3 day hx of dizziness, syncopal episode, and ?vomiting.  Brief arrest vs syncopal episode with EMS with < 60 sec CPR.  In ER, remained hypotensive and agitated, requiring intubation for airway protection.  Required pressors for hypotension despite IVF, found to have AKI, hypokalemia, and LLL PNA.  Started empiric abx.  PCCM to admit.  History of present illness   HPI obtained from medical chart and by conversation with wife as patient is sedated and intubated on mechanical ventilation.   68 year old male with history of nonsmoker, Multiple myeloma (dx 2016, in remission on Revlimid), HTN, HLD, previous NICM with improved EF on recent TTE to 65%, CAD, hx tonsillar cancer.  Wife reports patient has had a cold for the last two weeks after catching it from her.  He recently went to doctor and placed on antibiotics as his symptoms and cough did not improve. States he has not ate well since but has been drinking well.  3 days ago, patient developed dizziness and had syncopal episode at home.  He continued to feel back and fell again last night. He starting complaining of leg and back pain and said he had been vomiting, although wife has not witnessed any vomiting.   Wife called EMS and had brief episode of apnea and pulseless and received CPR for < 60 secs prior to patient becoming conscious during transport.  In ER, patient remained confused and agitated.  He was intubated for airway protection.  He was profoundly hypotensive, despite 4L IVF requiring vasopressors.  He was mildly hypothermic, not hypoxic, and bradycardic with several pauses into the 30's while in ER.  CT head and cervical neg, CT abd performed showing left lower lobe pneumoina and  mild hydronephrosis and distended bladder.  Labs showed Lactic 6.9-> 4.6, K 2.2, sCr 2.65, WBC 6.9, mild transaminitis, UA pending.  EKG SB, with initial diffuse STD, no STE and prolonged QTc.  Started on empiric antibiotics and PCCM called for admission.   Past Medical History  Multiple myeloma (dx 2016, in remission on Revlimid), HTN, HLD, non smoker, previous NICM with improved EF on recent TTE to 65%, CAD, hx tonsillar cancer  Significant Hospital Events   1/27 Admit  Consults:   Procedures:  1/27 ETT >> 1/27 R femoral TL CVL >> 1/27 foley >>  Significant Diagnostic Tests:  01/12/2019 CTH/ cervical  >> 1. No acute intracranial abnormality. 2. Diffuse bone marrow disease consistent with multiple myeloma.  01/24/2019 CT abd/ pelvis >> 1. Aspiration or pneumonia in the left more than right lung base. 2. Bilateral hydronephrosis and hydroureter above an over distended bladder. 3. Nephrolithiasis and cholelithiasis. 4. Sigmoid epiploic appendagitis of indeterminate chronicity.  Micro Data:  1/27 BCx 2 >> no growth in 24-hour 1/27 urine cx >> no growth 1/27 Resp cx  >> gram-positive rods on prelim. 1/27 MRSA PCR >> negative  Antimicrobials:  1/27 vanc x 1 1/27 flagyl x 1 1/27 aztreonam x 1 1/27 azithromax  1/27 ceftriaxone >>  Interim history/subjective:  Hypoglycemia requiring D50. Intermittent bradycardia. Intermittent LE twitching.   Objective   Blood pressure (!) 104/53, pulse 62, temperature 98.2 F (36.8 C), resp. rate 16, height '5\' 10"'  (1.778 m), weight 65.2  kg, SpO2 100 %.    Vent Mode: PRVC FiO2 (%):  [30 %] 30 % Set Rate:  [16 bmp] 16 bmp Vt Set:  [510 mL] 510 mL PEEP:  [5 cmH20] 5 cmH20 Plateau Pressure:  [13 cmH20-16 cmH20] 16 cmH20   Intake/Output Summary (Last 24 hours) at 01/25/2019 0835 Last data filed at 01/25/2019 0640 Gross per 24 hour  Intake 4506.23 ml  Output 3885 ml  Net 621.23 ml   Filed Weights   01/03/2019 0900 01/24/19 0500 01/25/19  0500  Weight: 61.7 kg 64.1 kg 65.2 kg    Examination: General: Thin critically ill adult male sedated, on vent. HEENT: AT,  anicteric, ETT , no JVD, abrasion to nasal bridge and right forehead Neuro: sedated, not following any commands. CV: Regular rate and rhythm with very distant heart sounds. PULM: Clear bilaterally with decreased breath sound at bases. GI: soft, non-tender, bs active, foley w/cyu Extremities: Relatively cool lower extremities with no edema or cyanosis. Skin: no rashes, bilateral arms with bruising and wrapped in ACE bandages  Resolved Hospital Problem list    Assessment & Plan:  Shock- presumed septic, vs hypovolemia from dehydration vs cardiogenic  Lactic acidosis resolved, continue to require higher doses of pressors. Cortrack was placed today and tube feeds started. P:  Goal MAP >65 Continue levophed, and vasopressin. DC Versed infusion and increase fentanyl if needed with Precedex. Repeat echo- with normal EF and moderately reduced RV function  And elevated PA.  Acute respiratory failure LLL infiltrate (immunosuppressed) P:  Full MV support. Wean FiO2 for sats > 92-99% VAP measures pepcid for SUP Continue with azithro and ceftriaxone for 5 days total.  Brief Cardiac arrest vs syncope episode with <60 sec CPR 1/27 - wife reports syncope episode x 2 at home with dizziness  Bradycardia- with pauses into the 30's Prolonged QTc- 0.559 Elevated Troponin-started trending up again,last 0.64 - EKG today with NSR and without any acute changes -Continue trending trop. - per cards notes, previously on midodrine for orthostatic hypotension since off P:  Tele monitor for ongoing arrhthymias/ ectopy/ pauses Goal K >2, mag >2 Wife wishes for no more CPR   Electrolyte abnormalities.  Persistent hypokalemia, hypernatremia resolved. Mild hypocalcemia, corrected at 8.4, Hypomagnesemia. -Change IV fluid with NS and D5 -Replete potassium and magnesium. -Give  Calcium gluconate. -Monitor BMP.  Acute encephalopathy likely related to hypotension -CTH/ cervical neg P:  Monitor Ongoing neuro checks PAD protocol with fentanyl and Precedex w/ bowel regimen Daily WUA RASS goal 0/-1  AKI on CKD2 Bilateral mild hydronephrosis with distended bladder- s/p foley with ~1.2 L output. Renal ultrasound with no hydronephrosis. Creatinine continue to improve, at 1.31today with baseline of 1.2. P:  Continue foley given critical illness Check urine Cr/ Na Strict I/O's, daily wts, UOP trend Repeat renal function in am.  Multiple myeloma - in remission P:  Hold revlimid   Hx HTN, HLD P:  Hold zocor, losartan Continue daily ASA 39m  Hyperglycemia P:  CBG a 4, add SSI if > 180  Mild Transaminitis: Resolved - CT abd without acute process P:  Repeat LFTs in am    Best practice:  Diet: NPO Pain/Anxiety/Delirium protocol (if indicated): fentanyl /Precedex VAP protocol (if indicated): yes DVT prophylaxis: SCDs/ heparin GI prophylaxis: pepcid Glucose control: CBG q 4 Mobility: BR Code Status: After lengthly discussion with wife, she states patient has recently told her he was so tired of feeling bad and didn't want to do this anymore.  She states  he would not want this but is agreeable to continue short term support for now.  She states no CPR if despite all current aggressive measures he were to decompensate to this.  Orders change to reflect this  Family Communication:  Disposition: admit ICU  Labs   CBC: Recent Labs  Lab 01/08/2019 0255 01/21/2019 0306 01/24/19 0420 01/24/19 1145 01/25/19 0425  WBC 6.9  --  7.1  --  6.4  NEUTROABS 5.0  --   --   --   --   HGB 15.5 15.6 14.9 12.9* 13.6  HCT 44.6 46.0 44.6 38.0* 39.6  MCV 95.7  --  100.9*  --  98.8  PLT 191  --  154  --  95*    Basic Metabolic Panel: Recent Labs  Lab 01/11/2019 0255  01/01/2019 1200 12/28/2018 2342 01/24/19 0745 01/24/19 1145 01/24/19 1735 01/25/19 0425  NA 130*    < > 142 152* 151* 155* 147* 144  K 2.2*   < > 3.0* 2.4* 3.1* 2.3* 2.9* 2.9*  CL 92*  --  118* 125* 125*  --  126* 120*  CO2 20*  --  13* 19* 16*  --  17* 18*  GLUCOSE 165*  --  150* 142* 125*  --  138* 124*  BUN 12  --  '11 9 10  ' --  7* 6*  CREATININE 2.65*   < > 2.04* 2.08* 1.73*  --  1.45* 1.31*  CALCIUM 8.7*  --  7.3* 7.5* 7.0*  --  6.8* 6.7*  MG 1.8  --   --  1.9 1.7  --   --  1.5*   < > = values in this interval not displayed.   GFR: Estimated Creatinine Clearance: 49.1 mL/min (A) (by C-G formula based on SCr of 1.31 mg/dL (H)). Recent Labs  Lab 01/02/2019 0255 01/14/2019 0500 01/18/2019 0736 01/24/19 0420 01/25/19 0425  WBC 6.9  --   --  7.1 6.4  LATICACIDVEN 6.9* 4.6* 1.7  --   --     Liver Function Tests: Recent Labs  Lab 01/09/2019 0255 01/24/19 0745 01/25/19 0425  AST 43* 30 40  ALT 46* 35 32  ALKPHOS 122 88 96  BILITOT 1.7* 0.9 0.5  PROT 5.7* 4.7* 4.5*  ALBUMIN 3.0* 2.1* 1.9*   Recent Labs  Lab 01/22/2019 0255  LIPASE 32   Recent Labs  Lab 01/14/2019 0302  AMMONIA 25    ABG    Component Value Date/Time   PHART 7.274 (L) 01/24/2019 1145   PCO2ART 35.8 01/24/2019 1145   PO2ART 108.0 01/24/2019 1145   HCO3 16.6 (L) 01/24/2019 1145   TCO2 18 (L) 01/24/2019 1145   ACIDBASEDEF 9.0 (H) 01/24/2019 1145   O2SAT 98.0 01/24/2019 1145     Coagulation Profile: No results for input(s): INR, PROTIME in the last 168 hours.  Cardiac Enzymes: Recent Labs  Lab 01/22/2019 1926 01/24/19 0745 01/24/19 1305 01/24/19 1735 01/25/19 0425  TROPONINI 0.43* 0.22* 0.35* 0.48* 0.64*    HbA1C: No results found for: HGBA1C  CBG: Recent Labs  Lab 01/25/19 0008 01/25/19 0357 01/25/19 0725 01/25/19 0726 01/25/19 0752  GLUCAP 110* 104* 48* 50* 205*    Review of Systems:   Unable to obtain  Past Medical History  He,  has a past medical history of Arthritis, Cholelithiases, Chronic kidney disease, Hyperlipidemia, Hypertension, Multiple myeloma ([place]), and Tonsillar  cancer ([place]) (2006).   Surgical History    Past Surgical History:  Procedure Laterality Date  .  APPENDECTOMY  1962  . CARDIAC CATHETERIZATION N/A 04/30/2015   Procedure: Left Heart Cath and Coronary Angiography;  Surgeon: Peter M Martinique, MD;  Location: [facility] INVASIVE CV LAB CUPID;  Service: Cardiovascular;  Laterality: N/A;  . CARDIOLITE MYOCARDIAL PERFUSION STUDY  04/16/03   NEGITIVE BRUCE PROTOCAL EXERCISE STRESS TEST. EF 70%. NO ISCHEMIA.  [name] KNEE SURGERY  2012   right  . [place]   left     Social History   reports that he quit smoking about 15 years ago. His smoking use included cigars. He quit smokeless tobacco use about 24 years ago.  His smokeless tobacco use included chew. He reports that he does not drink alcohol or use drugs.   Family History   His family history includes Cancer in his father and sister; Cancer (age of onset: 60) in his mother; Hypertension in his sister.   Allergies Allergies  Allergen Reactions  . Hydrochlorothiazide Rash and Palpitations  . Asa [Aspirin] Nausea Only  . Calcium Carbonate Nausea And Vomiting  . Oxycodone Other (See Comments)    Patient does not want to take. Severe Hallucinations.  . Amoxicillin Itching and Rash    Did it involve swelling of the face/tongue/throat, SOB, or low BP? Unknown Did it involve sudden or severe rash/hives, skin peeling, or any reaction on the inside of your mouth or nose? Unknown Did you need to seek medical attention at a hospital or doctor's office? Unknown When did it last happen? Unk If all above answers are "NO", may proceed with cephalosporin use.   . Indapamide Palpitations    Lightheadedness, dizziness     Home Medications  Prior to Admission medications   Medication Sig Start Date End Date Taking? Authorizing Provider  acetaminophen-codeine (TYLENOL #3) 300-30 MG tablet Take 1 tablet by mouth every 8 (eight) hours as needed for severe pain. 10/13/18   Truitt Merle, MD  aspirin 81  MG chewable tablet Chew 1 tablet (81 mg total) by mouth daily. 05/01/15   Caren Griffins, MD  calcium carbonate (OS-CAL) 600 MG TABS tablet Take 1,200 mg by mouth at bedtime.     [provider]  Cholecalciferol (VITAMIN D3) 1000 units CAPS Take 2,000 mg by mouth daily.    [provider]  losartan (COZAAR) 25 MG tablet Take 1 tablet (25 mg total) by mouth daily. 01/12/19 04/12/19  Hilty, Nadean Corwin, MD  Multiple Vitamins-Minerals (CENTRUM SILVER ULTRA MENS PO) Take 1 tablet by mouth at bedtime.     [provider]  naftifine (NAFTIN) 1 % cream Apply 1 application topically daily as needed.    [provider]  nitroGLYCERIN (NITROSTAT) 0.4 MG SL tablet Place 0.4 mg under the tongue every 5 (five) minutes x 3 doses as needed for chest pain. Reported on 05/19/2016    [provider]  ondansetron (ZOFRAN) 4 MG tablet TAKE 1 TABLET FOUR TIMES A DAY AS NEEDED FOR NAUSEA OR VOMITING 12/08/18   Truitt Merle, MD  potassium chloride (MICRO-K) 10 MEQ CR capsule TAKE 3 CAPSULES DAILY 11/08/18   Truitt Merle, MD  ranitidine (ZANTAC) 150 MG tablet Take 2 tablets (300 mg total) by mouth at bedtime. 05/24/15   Truitt Merle, MD  REVLIMID 5 MG capsule Take 1 capsule (5 mg total) by mouth as directed. Take 5 mg daily for 21 days, rest 7 days. 12/30/18   Truitt Merle, MD  simvastatin (ZOCOR) 20 MG tablet Take 20 mg by mouth daily.    [provider]  vitamin B-12 (CYANOCOBALAMIN) 500 MCG tablet Take 1 tablet by mouth daily.    [provider]  zolpidem (AMBIEN) 5 MG tablet Take 1 tablet (5 mg total) by mouth at bedtime as needed for sleep. 04/26/17   Theodis Blaze, MD         Lorella Nimrod MD PGY3 Pager [phone] 01/25/2019, 8:35 AM

## 2019-01-25 NOTE — Progress Notes (Signed)
CRITICAL VALUE ALERT  Critical Value:  Troponin 0.64  Date & Time Notied:  01/25/2019 @ 7741  Provider Notified: Warren Lacy MD  Orders Received/Actions taken: MD made aware

## 2019-01-25 NOTE — Progress Notes (Signed)
CRITICAL VALUE ALERT  Critical Value: Troponin 1.26   Date & Time Notied: 01/25/19 21:05   Provider Notified: Warren Lacy RN   Orders Received/Actions taken: RN will make MD aware

## 2019-01-25 NOTE — Progress Notes (Signed)
eLink Physician-Brief Progress Note Patient Name: Eric Dudley DOB: 1951/10/03 MRN: [id number]   Date of Service  01/25/2019  HPI/Events of Note  K+ 2.9, Troponin 0.94 s/p CPR  eICU Interventions  Elink electrolyte replacement protocol, trend Troponin and obtain echo looking for regional wall motion abnormality to correlate with possible myocardial event-no RWMA        [name] U [name] 01/25/2019, 6:03 AM

## 2019-01-25 NOTE — Progress Notes (Signed)
ANTICOAGULATION CONSULT NOTE - Initial Consult  Pharmacy Consult for Heparin  Indication: chest pain/ACS  Patient Measurements: Height: _0  (177.8 cm) Weight: 143 lb 11.8 oz (65.2 kg) IBW/kg (Calculated) : 73  Vital Signs: Temp: 97.9 F (36.6 C) (01/29 2100) BP: 107/41 (01/29 2100) Pulse Rate: 59 (01/29 2100)  Labs: Recent Labs    01/21/2019 0255  01/24/19 0420  01/24/19 1145  01/24/19 1735 01/25/19 0425 01/25/19 1115 01/25/19 1552 01/25/19 1805  HGB 15.5   < > 14.9  --  12.9*  --   --  13.6  --  11.6*  --   HCT 44.6   < > 44.6  --  38.0*  --   --  39.6  --  34.0*  --   PLT 191  --  154  --   --   --   --  95*  --   --   --   CREATININE 2.65*   < >  --    < >  --   --  1.45* 1.31* 1.16  --   --   TROPONINI 0.05*   < >  --    < >  --    < > 0.48* 0.64* 0.85*  --  1.26*   < > = values in this interval not displayed.    Estimated Creatinine Clearance: 55.4 mL/min (by C-G formula based on SCr of 1.16 mg/dL).   Medical History: Past Medical History:  Diagnosis Date  . Arthritis    neck and back pain  . Cholelithiases   . Chronic kidney disease    MILD,CHRONIC  . Hyperlipidemia   . Hypertension   . Multiple myeloma ([place])   . Tonsillar cancer ([place]) 2006   Assessment: 68 y/o M with increasing troponin (0.64>>0.85>>1.26) to start heparin per pharmacy. Will reduce bolus with ongoing SQ heparin injections. Hgb 11.6. Renal function improving. PTA meds reviewed.   Goal of Therapy:  Heparin level 0.3-0.7 units/ml Monitor platelets by anticoagulation protocol: Yes   Plan:  Heparin 2000 units BOLUS Start heparin drip at 800 units/hr 0700 HL Daily CBC/HL Monitor for bleeding   Narda Bonds 01/25/2019,10:53 PM

## 2019-01-26 ENCOUNTER — Encounter (HOSPITAL_COMMUNITY): Payer: Self-pay | Admitting: Cardiology

## 2019-01-26 DIAGNOSIS — Z8674 Personal history of sudden cardiac arrest: Secondary | ICD-10-CM

## 2019-01-26 DIAGNOSIS — J690 Pneumonitis due to inhalation of food and vomit: Secondary | ICD-10-CM

## 2019-01-26 DIAGNOSIS — E43 Unspecified severe protein-calorie malnutrition: Secondary | ICD-10-CM

## 2019-01-26 DIAGNOSIS — D61818 Other pancytopenia: Secondary | ICD-10-CM

## 2019-01-26 DIAGNOSIS — E872 Acidosis: Secondary | ICD-10-CM

## 2019-01-26 DIAGNOSIS — C9001 Multiple myeloma in remission: Secondary | ICD-10-CM

## 2019-01-26 DIAGNOSIS — I248 Other forms of acute ischemic heart disease: Secondary | ICD-10-CM

## 2019-01-26 DIAGNOSIS — G9340 Encephalopathy, unspecified: Secondary | ICD-10-CM

## 2019-01-26 LAB — CBC
HCT: 36.5 % — ABNORMAL LOW (ref 39.0–52.0)
Hemoglobin: 12 g/dL — ABNORMAL LOW (ref 13.0–17.0)
MCH: 33.2 pg (ref 26.0–34.0)
MCHC: 32.9 g/dL (ref 30.0–36.0)
MCV: 101.1 fL — ABNORMAL HIGH (ref 80.0–100.0)
Platelets: 71 10*3/uL — ABNORMAL LOW (ref 150–400)
RBC: 3.61 MIL/uL — ABNORMAL LOW (ref 4.22–5.81)
RDW: 14.6 % (ref 11.5–15.5)
WBC: 5.8 10*3/uL (ref 4.0–10.5)
nRBC: 0 % (ref 0.0–0.2)

## 2019-01-26 LAB — GLUCOSE, CAPILLARY
Glucose-Capillary: 106 mg/dL — ABNORMAL HIGH (ref 70–99)
Glucose-Capillary: 115 mg/dL — ABNORMAL HIGH (ref 70–99)
Glucose-Capillary: 121 mg/dL — ABNORMAL HIGH (ref 70–99)
Glucose-Capillary: 78 mg/dL (ref 70–99)
Glucose-Capillary: 89 mg/dL (ref 70–99)
Glucose-Capillary: 99 mg/dL (ref 70–99)

## 2019-01-26 LAB — TROPONIN I
TROPONIN I: 2.16 ng/mL — AB (ref <0.03)
Troponin I: 1.59 ng/mL (ref <0.03)
Troponin I: 2.24 ng/mL (ref <0.03)
Troponin I: 1.76 ng/mL (ref <0.03)

## 2019-01-26 LAB — RENAL FUNCTION PANEL
ALBUMIN: 1.9 g/dL — AB (ref 3.5–5.0)
Albumin: 1.8 g/dL — ABNORMAL LOW (ref 3.5–5.0)
Anion gap: 6 (ref 5–15)
Anion gap: 7 (ref 5–15)
BUN: 9 mg/dL (ref 8–23)
BUN: 8 mg/dL (ref 8–23)
CALCIUM: 6.9 mg/dL — AB (ref 8.9–10.3)
CHLORIDE: 119 mmol/L — AB (ref 98–111)
CO2: 17 mmol/L — ABNORMAL LOW (ref 22–32)
CO2: 18 mmol/L — ABNORMAL LOW (ref 22–32)
Calcium: 7.1 mg/dL — ABNORMAL LOW (ref 8.9–10.3)
Chloride: 116 mmol/L — ABNORMAL HIGH (ref 98–111)
Creatinine, Ser: 1.09 mg/dL (ref 0.61–1.24)
Creatinine, Ser: 0.98 mg/dL (ref 0.61–1.24)
GFR calc Af Amer: >60 mL/min (ref >60)
GFR calc Af Amer: >60 mL/min (ref >60)
GFR calc non Af Amer: >60 mL/min (ref >60)
Glucose, Bld: 118 mg/dL — ABNORMAL HIGH (ref 70–99)
Glucose, Bld: 100 mg/dL — ABNORMAL HIGH (ref 70–99)
Phosphorus: <1 mg/dL — CL (ref 2.5–4.6)
Phosphorus: 2 mg/dL — ABNORMAL LOW (ref 2.5–4.6)
Potassium: 3.1 mmol/L — ABNORMAL LOW (ref 3.5–5.1)
Potassium: 3.9 mmol/L (ref 3.5–5.1)
SODIUM: 141 mmol/L (ref 135–145)
Sodium: 142 mmol/L (ref 135–145)

## 2019-01-26 LAB — HEPARIN LEVEL (UNFRACTIONATED)
Heparin Unfractionated: 0.35 IU/mL (ref 0.30–0.70)
Heparin Unfractionated: 0.34 IU/mL (ref 0.30–0.70)

## 2019-01-26 LAB — MAGNESIUM
Magnesium: 1.3 mg/dL — ABNORMAL LOW (ref 1.7–2.4)
Magnesium: 2 mg/dL (ref 1.7–2.4)
Magnesium: 1.8 mg/dL (ref 1.7–2.4)

## 2019-01-26 LAB — PHOSPHORUS: Phosphorus: 2.1 mg/dL — ABNORMAL LOW (ref 2.5–4.6)

## 2019-01-26 MED ORDER — MAGNESIUM SULFATE 4 GM/100ML IV SOLN
4.0000 g | Freq: Once | INTRAVENOUS | Status: AC
  Administered 2019-01-26: 4 g via INTRAVENOUS
  Filled 2019-01-26: qty 100

## 2019-01-26 MED ORDER — POTASSIUM CHLORIDE 10 MEQ/50ML IV SOLN
10.0000 meq | INTRAVENOUS | Status: AC
  Administered 2019-01-26 (×6): 10 meq via INTRAVENOUS
  Filled 2019-01-26 (×5): qty 50

## 2019-01-26 MED ORDER — POTASSIUM PHOSPHATES 15 MMOLE/5ML IV SOLN
20.0000 mmol | Freq: Once | INTRAVENOUS | Status: AC
  Administered 2019-01-26: 20 mmol via INTRAVENOUS
  Filled 2019-01-26: qty 6.67

## 2019-01-26 MED ORDER — DEXTROSE 5 % IV SOLN
INTRAVENOUS | Status: DC
  Administered 2019-01-26: 12:00:00 via INTRAVENOUS

## 2019-01-26 MED ORDER — LORAZEPAM 2 MG/ML IJ SOLN
1.0000 mg | INTRAMUSCULAR | Status: DC | PRN
  Administered 2019-01-27 – 2019-02-03 (×12): 1 mg via INTRAVENOUS
  Filled 2019-01-26 (×12): qty 1

## 2019-01-26 MED ORDER — DEXMEDETOMIDINE HCL IN NACL 400 MCG/100ML IV SOLN
0.4000 ug/kg/h | INTRAVENOUS | Status: DC
  Administered 2019-01-26: 0.4 ug/kg/h via INTRAVENOUS
  Filled 2019-01-26: qty 100
  Filled 2019-01-26: qty 200

## 2019-01-26 MED ORDER — SODIUM BICARBONATE 8.4 % IV SOLN
INTRAVENOUS | Status: DC
  Administered 2019-01-26 – 2019-01-27 (×3): via INTRAVENOUS
  Filled 2019-01-26 (×4): qty 150

## 2019-01-26 MED ORDER — POTASSIUM PHOSPHATES 15 MMOLE/5ML IV SOLN
30.0000 mmol | Freq: Once | INTRAVENOUS | Status: AC
  Administered 2019-01-26: 30 mmol via INTRAVENOUS
  Filled 2019-01-26: qty 10

## 2019-01-26 MED ORDER — HALOPERIDOL LACTATE 5 MG/ML IJ SOLN
5.0000 mg | Freq: Four times a day (QID) | INTRAMUSCULAR | Status: DC | PRN
  Administered 2019-01-26 (×2): 5 mg via INTRAVENOUS
  Filled 2019-01-26: qty 1

## 2019-01-26 NOTE — Consult Note (Signed)
Cardiology Consultation:   Patient ID: Eric Dudley MRN: [id number]; DOB: 23-Jun-1951  Admit date: 01/05/2019 Date of Consult: 01/26/2019  Primary Care Provider: [name], MD Primary Cardiologist: Pixie Casino, MD  Primary Electrophysiologist:  None    Patient Profile:   Eric Dudley is a 68 y.o. male with a hx of htn, HLD, tonsillar cancer, CAD with STEMI in 2016 with drop in EF but cath with minima non obstructive disease, continues on ASA and plavix, also with multiple myeloma (in remission) with prior bone marrow transplant who is being seen today for the evaluation of elevated troponi at the request of Dr. Loanne Drilling.[name]  History of Present Illness:   Eric Dudley with above hx and last visit with Dr. Debara Pickett 01/10/19. 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   Pt has been stable.    Now admitted 01/23/18 with low back pain, lt hip and shoulder pain.  EMS had been called, pt had 3 day hx of dizziness, syncope, and possible brief arrest vs. Syncope.  < 60 sec of CPR.  Was hypotensive in ER with agitation and was intubated to protect airway.   Given IV fluid and pressors.  He had AKI, hypokalemia and LLL PNA.    He did have bradycardia and HR in the 30s with several pauses.   K+ 2.2, lactic acid 6.9 to 4.6, Cr 2.65, WBC 6.9.    Still on vent due to confusion.    EKG:  The EKG was personally reviewed and demonstrates:  SR with ST depression lateral leads though the baseline is poor.  On one EKG severe Ant ST depression, one with bradycardia to 89  These changes improved by 01/24/19.  On the 29th EKG back to baseline.   Telemetry:  Telemetry was personally reviewed and demonstrates:  SR to ST with PACs  Troponin Troponin 0.22>0.35>0.48>0.64>0.85>1.26>2.16>2.24 Na 141, K+ 3.9, CO2 18 Ca+ 6.9 phos 2.0, Mg+ 2.0  hgb 12, WBC 5.8 and plts 71  Echo 01/24/19 with poor windows.  Normal LV function EF 50-55%, RV moderately enlarged and moderately reduced systolic  function.  With any stimulation he tries to pull out ET tube. Does not answer question.  Not following instruction.   Past Medical History:  Diagnosis Date  . Arthritis    neck and back pain  . Cholelithiases   . Chronic kidney disease    MILD,CHRONIC  . Hyperlipidemia   . Hypertension   . Multiple myeloma ([place])   . 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 APPENDECTOMY  1962  . CARDIAC CATHETERIZATION N/A 04/30/2015   Procedure: Left Heart Cath and Coronary Angiography;  Surgeon: Peter M Martinique, MD;  Location: [facility] INVASIVE CV LAB CUPID;  Service: Cardiovascular;  Laterality: N/A;  . CARDIOLITE MYOCARDIAL PERFUSION STUDY  04/16/03   NEGITIVE BRUCE PROTOCAL EXERCISE STRESS TEST. EF 70%. NO ISCHEMIA.  [name] KNEE SURGERY  2012   right  . SHOULDER SURGERY  1999   left     Home Medications:  Prior to Admission medications   Medication Sig Start Date End Date Taking? Authorizing Provider  aspirin 81 MG chewable tablet Chew 1 tablet (81 mg total) by mouth daily. 05/01/15  Yes Eric Griffins, MD  brompheniramine-pseudoephedrine-DM 30-2-10 MG/5ML syrup Take 5 mLs by mouth 3 (three) times daily as needed for cough. 01/13/19  Yes [provider]  losartan (COZAAR) 25 MG tablet Take 1 tablet (25 mg total)  by mouth daily. 01/12/19 04/12/19 Yes Eric Dudley, Eric Corwin, MD  naftifine (NAFTIN) 1 % cream Apply 1 application topically daily as needed (skin irritation).    Yes [provider]  nitroGLYCERIN (NITROSTAT) 0.4 MG SL tablet Place 0.4 mg under the tongue every 5 (five) minutes x 3 doses as needed for chest pain. Reported on 05/19/2016   Yes [provider]  ondansetron (ZOFRAN) 4 MG tablet TAKE 1 TABLET FOUR TIMES A DAY AS NEEDED FOR NAUSEA OR VOMITING Patient taking differently: Take 4 mg by mouth 4 (four) times daily as needed for nausea or vomiting.  12/08/18  Yes Eric Merle, MD  REVLIMID 5 MG capsule Take 1 capsule (5 mg total)  by mouth as directed. Take 5 mg daily for 21 days, rest 7 days. 12/30/18  Yes Eric Merle, MD  simvastatin (ZOCOR) 20 MG tablet Take 20 mg by mouth daily.   Yes [provider]  valACYclovir (VALTREX) 1000 MG tablet Take 1,000 mg by mouth daily.   Yes [provider]  zolpidem (AMBIEN) 5 MG tablet Take 1 tablet (5 mg total) by mouth at bedtime as needed for sleep. 04/26/17  Yes Theodis Blaze, MD  potassium chloride (MICRO-K) 10 MEQ CR capsule TAKE 3 CAPSULES DAILY Patient not taking: Reported on 01/04/2019 11/08/18   Eric Merle, MD  ranitidine (ZANTAC) 150 MG tablet Take 2 tablets (300 mg total) by mouth at bedtime. Patient not taking: Reported on 12/29/2018 05/24/15   Eric Merle, MD    Inpatient Medications: Scheduled Meds: . aspirin  81 mg Oral Daily  . chlorhexidine gluconate (MEDLINE KIT)  15 mL Mouth Rinse BID  . clopidogrel  75 mg Oral Daily  . free water  200 mL Per Tube Q4H  . mouth rinse  15 mL Mouth Rinse 10 times per day   Continuous Infusions: . sodium chloride Stopped (01/26/19 0754)  . azithromycin Stopped (01/26/19 0856)  . cefTRIAXone (ROCEPHIN)  IV Stopped (01/26/19 5809)  . dexmedetomidine (PRECEDEX) IV infusion 0.4 mcg/kg/hr (01/26/19 1218)  . famotidine (PEPCID) IV Stopped (01/26/19 1053)  . fentaNYL infusion INTRAVENOUS 100 mcg/hr (01/26/19 1400)  . heparin 800 Units/hr (01/26/19 0801)  . norepinephrine (LEVOPHED) Adult infusion 5 mcg/min (01/26/19 1400)  . potassium PHOSPHATE IVPB (in mmol)    .  sodium bicarbonate  infusion 1000 mL 75 mL/hr at 01/26/19 1400  . vasopressin (PITRESSIN) infusion - *FOR SHOCK* 0.03 Units/min (01/26/19 1400)   PRN Meds: sodium chloride, albuterol, fentaNYL, midazolam  Allergies:    Allergies  Allergen Reactions  . Hydrochlorothiazide Rash and Palpitations  . Asa [Aspirin] Nausea Only  . Calcium Carbonate Nausea And Vomiting  . Oxycodone Other (See Comments)    Patient does not want to take. Severe Hallucinations.    . Amoxicillin Itching and Rash    Did it involve swelling of the face/tongue/throat, SOB, or low BP? Unknown Did it involve sudden or severe rash/hives, skin peeling, or any reaction on the inside of your mouth or nose? Unknown Did you need to seek medical attention at a hospital or doctor's office? Unknown When did it last happen? Unk If all above answers are "NO", may proceed with cephalosporin use.   . Indapamide Palpitations    Lightheadedness, dizziness    Social History:   Social History   Socioeconomic History  . Marital status: Married    Spouse name: Not on file  . Number of children: Not on file  . Years of education: Not on file  .  Highest education level: Not on file  Occupational History  . Occupation: Retired  Scientific laboratory technician  . Financial resource strain: Not on file  . Food insecurity:    Worry: Not on file    Inability: Not on file  . Transportation needs:    Medical: Not on file    Non-medical: Not on file  Tobacco Use  . Smoking status: Former Smoker    Types: Cigars    Last attempt to quit: 12/29/2003    Years since quitting: 15.0  . Smokeless tobacco: Former Systems developer    Types: [place] date: 12/28/1994  Substance and Sexual Activity  . Alcohol use: No    Comment: occasional beer  . Drug use: No  . Sexual activity: Not on file  Lifestyle  . Physical activity:    Days per week: Not on file    Minutes per session: Not on file  . Stress: Not on file  Relationships  . Social connections:    Talks on phone: Not on file    Gets together: Not on file    Attends religious service: Not on file    Active member of club or organization: Not on file    Attends meetings of clubs or organizations: Not on file    Relationship status: Not on file  . Intimate partner violence:    Fear of current or ex partner: Not on file    Emotionally abused: Not on file    Physically abused: Not on file    Forced sexual activity: Not on file  Other Topics Concern  .  Not on file  Social History Narrative  . Not on file    Family History:    Family History  Problem Relation Age of Onset  . Cancer Mother 84       cervical cancer   . Cancer Father        unknown cancer   . Hypertension Sister   . Cancer Sister        melanoma      ROS:  Please see the history of present illness. Obtained from chart as pt sedated and cannot give any answers.   General:recent colds  no weight changes Skin:no rashes or ulcers HEENT:no blurred vision, no congestion CV:see HPI PUL:see HPI GI:no diarrhea constipation or melena, no indigestion GU:no hematuria, no dysuria MS:no joint pain, no claudication Neuro:+ syncope with EMS, no lightheadedness Endo:no diabetes, no thyroid disease  All other ROS reviewed and negative.     Physical Exam/Data:   Vitals:   01/26/19 1121 01/26/19 1200 01/26/19 1300 01/26/19 1400  BP: (!) 97/45 115/64 (!) 92/54 (!) 85/49  Pulse: 66 64 (!) 58 (!) 51  Resp: (!) '26 16 16 16  ' Temp: 98.1 F (36.7 C) 98.1 F (36.7 C) 98.1 F (36.7 C) 98.1 F (36.7 C)  TempSrc: Core     SpO2: 100% 100% 100% 100%  Weight:      Height:        Intake/Output Summary (Last 24 hours) at 01/26/2019 1442 Last data filed at 01/26/2019 1400 Gross per 24 hour  Intake 4697.81 ml  Output 5095 ml  Net -397.19 ml   Last 3 Weights 01/26/2019 01/25/2019 01/24/2019  Weight (lbs) 147 lb 0.8 oz 143 lb 11.8 oz 141 lb 5 oz  Weight (kg) 66.7 kg 65.2 kg 64.1 kg     Body mass index is 21.1 kg/m.  General:  Frail, thin male -, in no acute  distress with sedation but with stimulation tries to pull out ET HEENT: normal Lymph: no adenopathy Neck: no JVD Endocrine:  No thryomegaly Vascular: No carotid bruits; pedal pulses 1+ bilaterally   Cardiac:  normal S1, S2; RRR; no murmur gallup rub or click Lungs:  + rhonchi to auscultation bilaterally   Abd: soft, nontender, no hepatomegaly  Ext: no edema Musculoskeletal:  No deformities, BUE and BLE strength normal  and equal not following commands but fighting to pull out ET Skin: warm and dry  Neuro: sedated, but does move all ext. Does not follow instructions.  Psych:  Normal affect    Relevant CV Studies: TTE 01/24/19  IMPRESSIONS    1. Extremely difficult study, with poor windows and poor acoustic transmission. Grossly normal LV function without significant valve abnormalities. RV appears moderately enlarged and moderately reduced function.  2. The left ventricle appears to be normal in size, has normal wall thickness 50-55% ejection fraction Spectral Doppler shows indeterminate pattern of diastolic filling.  3. The right ventricle has moderately enlarged size and moderately reduced systolic function.  4. Normal left atrial size.  5. Normal right atrial size.  6. The mitral valve normal in structure and function.  7. Normal tricuspid valve.  8. Aortic valve normal.  9. The inferior vena cava was normal in size <50% respiratory variablity. 10. No atrial level shunt detected by color flow Doppler.  FINDINGS  Left Ventricle: The left ventricle appears to be normal in size, has normal wall thickness low normal systolic function with a 09-38% ejection fraction Spectral Doppler shows indeterminate pattern of diastolic filling. Right Ventricle: The right ventricle is moderately enlarged in size normal wall thickness has moderately reduced systolic function. Left Atrium: The left atrium is normal in size. Right Atrium: The right atrial size is normal in size. Interatrial Septum: No atrial level shunt detected by color flow Doppler.  Pericardium: There is no evidence of pericardial effusion. Mitral Valve: The mitral valve normal in structure and function. Tricuspid Valve: The tricuspid valve is normal in structure. Tricuspid regurgitation was not visualized by color flow Doppler. Aortic Valve: The aortic valve normal in structure and function. Pulmonic Valve: The pulmonic valve is grossly normal.  Pulmonic valve regurgitation was not assessed by color flow Doppler. Venous: The inferior vena cava was normal in size less than 50% respiratory variablity.       Laboratory Data:  Chemistry Recent Labs  Lab 01/25/19 1115 01/25/19 1552 01/26/19 0341 01/26/19 1220  NA 141 142 142 141  K 3.1* 3.0* 3.1* 3.9  CL 116*  --  119* 116*  CO2 19*  --  17* 18*  GLUCOSE 87  --  118* 100*  BUN 7*  --  9 8  CREATININE 1.16  --  1.09 0.98  CALCIUM 6.8*  --  7.1* 6.9*  GFRNONAA >60  --  >60 >60  GFRAA >60  --  >60 >60  ANIONGAP 6  --  6 7    Recent Labs  Lab 01/22/2019 0255 01/24/19 0745 01/25/19 0425 01/26/19 0341 01/26/19 1220  PROT 5.7* 4.7* 4.5*  --   --   ALBUMIN 3.0* 2.1* 1.9* 1.8* 1.9*  AST 43* 30 40  --   --   ALT 46* 35 32  --   --   ALKPHOS 122 88 96  --   --   BILITOT 1.7* 0.9 0.5  --   --    Hematology Recent Labs  Lab 01/24/19 0420  01/25/19 0425  01/25/19 1552 01/26/19 0341  WBC 7.1  --  6.4  --  5.8  RBC 4.42  --  4.01*  --  3.61*  HGB 14.9   < > 13.6 11.6* 12.0*  HCT 44.6   < > 39.6 34.0* 36.5*  MCV 100.9*  --  98.8  --  101.1*  MCH 33.7  --  33.9  --  33.2  MCHC 33.4  --  34.3  --  32.9  RDW 14.1  --  14.3  --  14.6  PLT 154  --  95*  --  71*   < > = values in this interval not displayed.   Cardiac Enzymes Recent Labs  Lab 01/25/19 0425 01/25/19 1115 01/25/19 1805 01/26/19 0014 01/26/19 0619 01/26/19 1220  TROPONINI 0.64* 0.85* 1.26* 1.59* 2.16* 2.24*    Recent Labs  Lab 01/24/2019 0252  TROPIPOC 0.05    BNPNo results for input(s): BNP, PROBNP in the last 168 hours.  DDimer No results for input(s): DDIMER in the last 168 hours.  Radiology/Studies:  Ct Abdomen Pelvis Wo Contrast  Result Date: 01/25/2019 CLINICAL DATA:  Generalized acute abdominal pain. Apnea requiring intubation. Low back and left hip pain. EXAM: CT ABDOMEN AND PELVIS WITHOUT CONTRAST TECHNIQUE: Multidetector CT imaging of the abdomen and pelvis was performed following the  standard protocol without IV contrast. COMPARISON:  04/16/2015 FINDINGS: Lower chest: Reticulonodular airspace disease in the left more than right bases. Enteric tube has been placed with tip in the proximal stomach. Hepatobiliary: No focal liver abnormality.Cholelithiasis. Pancreas: Scattered coarse calcifications.  No acute finding. Spleen: Unremarkable. Adrenals/Urinary Tract: Negative adrenals. Mild bilateral hydronephrosis and hydroureter above a distended urinary bladder. Negative for ureteral calculus or other visible obstructive process. Punctate left and 3 mm right renal calculi. Stomach/Bowel: No obstruction. No bowel wall thickening. Focal hazy fat along the sigmoid colon without underlying wall thickening, likely sequela of epiploic appendagitis, history suggesting that this is chronic. Appendectomy. Vascular/Lymphatic: Atherosclerotic calcification. No mass or adenopathy. Reproductive:Negative Other: No ascites or pneumoperitoneum. Musculoskeletal: Prominent osteopenia in the setting of known multiple myeloma. Remote appearing T11 and T12 compression fractures with T12 cement augmentation. Remote lateral left ninth rib fracture. IMPRESSION: 1. Aspiration or pneumonia in the left more than right lung base. 2. Bilateral hydronephrosis and hydroureter above an over distended bladder. 3. Nephrolithiasis and cholelithiasis. 4. Sigmoid epiploic appendagitis of indeterminate chronicity. Electronically Signed   By: Monte Fantasia M.D.   On: 01/09/2019 05:03   Ct Head Wo Contrast  Result Date: 01/12/2019 CLINICAL DATA:  Posttraumatic headache EXAM: CT HEAD WITHOUT CONTRAST CT CERVICAL SPINE WITHOUT CONTRAST TECHNIQUE: Multidetector CT imaging of the head and cervical spine was performed following the standard protocol without intravenous contrast. Multiplanar CT image reconstructions of the cervical spine were also generated. COMPARISON:  None. FINDINGS: CT HEAD FINDINGS Brain: There is no mass, hemorrhage  or extra-axial collection. The size and configuration of the ventricles and extra-axial CSF spaces are normal. There is hypoattenuation of the periventricular white matter, most commonly indicating chronic ischemic microangiopathy. Vascular: No abnormal hyperdensity of the major intracranial arteries or dural venous sinuses. No intracranial atherosclerosis. Skull: Numerous calvarial lucencies. Sinuses/Orbits: Moderate diffuse paranasal sinus mucosal thickening. No mastoid or middle ear effusion. The orbits are normal. CT CERVICAL SPINE FINDINGS Alignment: No static subluxation. Facets are aligned. Occipital condyles are normally positioned. Skull base and vertebrae: Numerous bone marrow lucencies throughout the spine. No acute fracture. Soft tissues and spinal canal: No prevertebral fluid or swelling. No  visible canal hematoma. Disc levels: No advanced spinal canal or neural foraminal stenosis. Upper chest: No pneumothorax, pulmonary nodule or pleural effusion. Other: Normal visualized paraspinal cervical soft tissues. IMPRESSION: 1. No acute intracranial abnormality. 2. Diffuse bone marrow disease consistent with multiple myeloma. Electronically Signed   By: Ulyses Jarred M.D.   On: 01/03/2019 05:07   Ct Cervical Spine Wo Contrast  Result Date: 01/15/2019 CLINICAL DATA:  Posttraumatic headache EXAM: CT HEAD WITHOUT CONTRAST CT CERVICAL SPINE WITHOUT CONTRAST TECHNIQUE: Multidetector CT imaging of the head and cervical spine was performed following the standard protocol without intravenous contrast. Multiplanar CT image reconstructions of the cervical spine were also generated. COMPARISON:  None. FINDINGS: CT HEAD FINDINGS Brain: There is no mass, hemorrhage or extra-axial collection. The size and configuration of the ventricles and extra-axial CSF spaces are normal. There is hypoattenuation of the periventricular white matter, most commonly indicating chronic ischemic microangiopathy. Vascular: No abnormal  hyperdensity of the major intracranial arteries or dural venous sinuses. No intracranial atherosclerosis. Skull: Numerous calvarial lucencies. Sinuses/Orbits: Moderate diffuse paranasal sinus mucosal thickening. No mastoid or middle ear effusion. The orbits are normal. CT CERVICAL SPINE FINDINGS Alignment: No static subluxation. Facets are aligned. Occipital condyles are normally positioned. Skull base and vertebrae: Numerous bone marrow lucencies throughout the spine. No acute fracture. Soft tissues and spinal canal: No prevertebral fluid or swelling. No visible canal hematoma. Disc levels: No advanced spinal canal or neural foraminal stenosis. Upper chest: No pneumothorax, pulmonary nodule or pleural effusion. Other: Normal visualized paraspinal cervical soft tissues. IMPRESSION: 1. No acute intracranial abnormality. 2. Diffuse bone marrow disease consistent with multiple myeloma. Electronically Signed   By: Ulyses Jarred M.D.   On: 01/12/2019 05:07   US Renal  Result Date: 01/04/2019 CLINICAL DATA:  Septic shock and hypotension. History of multiple myeloma. EXAM: RENAL / URINARY TRACT ULTRASOUND COMPLETE COMPARISON:  CT scan 01/03/2019 FINDINGS: Right Kidney: Renal measurements: 11.3 x 4.5 x 5.0 cm = volume: 131.85 mL. Normal renal cortical thickness and echogenicity. Slightly prominent extra renal pelvis but no hydronephrosis. Lower pole right renal calculus is noted. No perinephric fluid collections. Left Kidney: Renal measurements: 11.2 x 5.8 x 4.7 cm = volume: 165.5 mL. Normal renal cortical thickness and echogenicity. No hydronephrosis. No worrisome renal lesions. No perinephric fluid collections. Bladder: Normal.  Foley catheter noted. IMPRESSION: Unremarkable renal ultrasound examination. Electronically Signed   By: Marijo Sanes M.D.   On: 01/25/2019 13:40   Dg Chest Port 1 View  Result Date: 01/24/2019 CLINICAL DATA:  Ventilator support. EXAM: PORTABLE CHEST 1 VIEW COMPARISON:  12/31/2018  FINDINGS: Nasogastric tube has been removed. Endotracheal tube tip is 5 cm above the carina. Mild patchy atelectasis or infiltrate in the left base persists. Chest is otherwise clear. IMPRESSION: Endotracheal tube tip 5 cm above the carina. Nasogastric tube removed. Persistent mild atelectasis or infiltrate in the left base. Electronically Signed   By: Nelson Chimes M.D.   On: 01/24/2019 07:35   Dg Chest Port 1 View  Result Date: 01/08/2019 CLINICAL DATA:  Chest pain EXAM: PORTABLE CHEST 1 VIEW COMPARISON:  10/22/2017 FINDINGS: Endotracheal tube tip just below the clavicular heads. The orogastric tube reaches the stomach with side port the lower esophagus. Interstitial coarsening with subtle reticulonodular density at the left base. No edema, effusion, or pneumothorax. Normal heart size. Prominent osteopenia.  There is history of multiple myeloma. IMPRESSION: 1. Endotracheal tube in good position. 2. Orogastric tube side port at the lower esophagus, consider advancement by  6-7 cm. 3. Questionable infiltrate in the left base which will likely be visualized on pending abdominal CT. Electronically Signed   By: Monte Fantasia M.D.   On: 12/29/2018 04:24   Dg Abd Portable 1v  Result Date: 01/25/2019 CLINICAL DATA:  Feeding tube placement. EXAM: PORTABLE ABDOMEN - 1 VIEW COMPARISON:  One-view abdomen 12/29/2018 FINDINGS: The tip of a small bore feeding tube is in the distal stomach, potentially the duodenal bulb. Right femoral line is stable. Dilated loops of small bowel in the left upper quadrant are again noted. Gallstones are present. IMPRESSION: 1. The tip of a small bore feeding tube is in the distal stomach, potentially the duodenum bulb. 2. Persistent dilated loops of small bowel in the left upper quadrant. Electronically Signed   By: San Morelle M.D.   On: 01/25/2019 11:03   Dg Abd Portable 1v  Result Date: 12/30/2018 CLINICAL DATA:  OG tube placement. EXAM: PORTABLE ABDOMEN - 1 VIEW  COMPARISON:  CT of the abdomen and pelvis 01/02/2019 FINDINGS: The side port of the OG tube is in the fundus of the stomach. Bowel gas pattern is normal. Right femoral line is in place. Gallstones are noted. Lung bases are clear. Defibrillator pad is in place. IMPRESSION: 1. OG tube terminates in the stomach, in satisfactory position. 2. Cholelithiasis. 3. Right femoral line in satisfactory position. Electronically Signed   By: San Morelle M.D.   On: 01/18/2019 09:33    Assessment and Plan:   1. Acute hypoxia respiratory failure on vent followed by CCM. 2. S/p PEA arrest with elevated troponin - and hx of MI in 2016 with elevated troponins and with ST depression with acute illness.   Prior cath 2016 with minimal CAD --EF at that time with decrease and has since returned to normal.  Dr. Debara Pickett to see.  EF normal now.  May be demand ischemia.   3. Septic shock.  On ABX and levophed and vasopressein.   4. Severe hypokalemia now improved 5. acute encephalopathy.    For questions or updates, please contact [place] Please consult [email or web address] for contact info under   Signed, Cecilie Kicks, NP  01/26/2019 2:42 PM   Pt. Seen and examined. Agree with the Resident/NP/PA-C note as written. This is a well-known 68 yo male patient of mine, just seen in the office 2 weeks ago - history of anterior  MI in 2016 with known LV aneurysm - cardiomyopathy which recently normalized, now with what appears to be sepsis/pneunomia picture complicated by shock and brief PEA arrest - subsequent elevation in troponin, possibly from arrest or global subendocardial ischemia, less likely ACS - but cannot rule this out. He is now on heparin and multiple pressors with eletrolyte abnormalities. Noted to have some brief atrial bigeminy yesterday, but predominantly sinus rhythm today. Echo shows low normal LV function with more significant RV dysfunction. He has been encephalopathic as well. On exam he is intubated,  sedated, but opens eyes to voice, agitated, hands in mitt restraints, lungs with decreased breath sounds, RRR, abdomen soft, non-tender, no LE edema.  This is a critically ill 68 yo male with a septic shock picture and elevated troponin after PEA arrest. There has been some decrease in LVEF, but low normal with RV dysfunction. Rhythm has been stable. I agree with IV heparin. No signs of CHF. Try to wean pressors as tolerated. Arrhythmias may have been d/t significant electrolyte abnormalities or shock/hyopothermia. No current indication for pacing. Continue ASA and Plavix. No plans  for ischemic evaluation in the near future.  Thanks for the consultation. Cardiology will follow with you.   Time Spent with Patient: I have spent a total of 65 minutes with patient reviewing hospital notes, telemetry, EKGs, labs and examining the patient as well as establishing an assessment and plan that was discussed with the patient. > 50% of time was spent in direct patient care.  Pixie Casino, MD, [facility], [place] Director of the [facility] Lipid [facility] &  Cardiovascular Risk [facility] Diplomate of the American Board of Clinical Lipidology Attending Cardiologist  Direct Dial: [phone]  Fax: [phone]  Website:  [email or web address]

## 2019-01-26 NOTE — Progress Notes (Signed)
ANTICOAGULATION CONSULT NOTE   Pharmacy Consult for Heparin  Indication: chest pain/ACS  Patient Measurements: Height: '5\' 10"'  (177.8 cm) Weight: 147 lb 0.8 oz (66.7 kg) IBW/kg (Calculated) : 73  Vital Signs: Temp: 98.2 F (36.8 C) (01/30 0600) BP: 97/56 (01/30 0600) Pulse Rate: 57 (01/30 0600)  Labs: Recent Labs    01/24/19 0420  01/25/19 0425 01/25/19 1115 01/25/19 1552 01/25/19 1805 01/26/19 0014 01/26/19 0341 01/26/19 0619  HGB 14.9   < > 13.6  --  11.6*  --   --  12.0*  --   HCT 44.6   < > 39.6  --  34.0*  --   --  36.5*  --   PLT 154  --  95*  --   --   --   --  71*  --   HEPARINUNFRC  --   --   --   --   --   --   --   --  0.35  CREATININE  --    < > 1.31* 1.16  --   --   --  1.09  --   TROPONINI  --    < > 0.64* 0.85*  --  1.26* 1.59*  --   --    < > = values in this interval not displayed.    Estimated Creatinine Clearance: 60.3 mL/min (by C-G formula based on SCr of 1.09 mg/dL).   Medical History: Past Medical History:  Diagnosis Date  . Arthritis    neck and back pain  . Cholelithiases   . Chronic kidney disease    MILD,CHRONIC  . Hyperlipidemia   . Hypertension   . Multiple myeloma ([place])   . 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 Will reduce bolus with ongoing SQ heparin injections. Hgb 11.6. Renal function improving. PTA meds reviewed.   1/30 AM update: initial heparin level therapeutic   Goal of Therapy:  Heparin level 0.3-0.7 units/ml Monitor platelets by anticoagulation protocol: Yes   Plan:  Cont heparin 800 units/hr 1300 HL  Joshaua Epple 01/26/2019,7:17 AM

## 2019-01-26 NOTE — Progress Notes (Signed)
CRITICAL VALUE ALERT  Critical Value: Phosphorus  <1.0    Date & Time Notied: 01/26/19  04:55   Provider Notified: Warren Lacy MD   Orders Received/Actions taken: MD made aware. Waiting for orders

## 2019-01-26 NOTE — Progress Notes (Signed)
eLink Physician-Brief Progress Note Patient Name: Eric Dudley DOB: 1951-08-21 MRN: [id number]   Date of Service  01/26/2019  HPI/Events of Note  Hypokalemia, hypophos, hypomag  eICU Interventions  Potassium, phos and mag replaced     Intervention Category Intermediate Interventions: Electrolyte abnormality - evaluation and management  DETERDING,ELIZABETH 01/26/2019, 5:02 AM

## 2019-01-26 NOTE — Progress Notes (Signed)
Eric Dudley   DOB:[birth year]   ST#:[id number]   LGX#:[id number]  Hematology and Oncology F/U   Subjective: Patient is well-known to me, under my care for his multiple myeloma.  He presented with syncope episode at home, EMS found he had a cardiac arrest in the ambulance, status post resuscitation, intubated in the ED, admitted for septic shock.  He has been intubated, heavily sedated due to his agitation, and remains to be pressors. I spoke with his wife a few days ago, he had upper respiratory symptoms for 2 to 3 days before the hospital admission, with very poor appetite, nausea, and profound fatigue.   Objective:  Vitals:   01/26/19 1700 01/26/19 1800  BP: (!) 104/59 (!) 84/55  Pulse: (!) 51 (!) 50  Resp: 16 16  Temp: 97.7 F (36.5 C) (!) 97.5 F (36.4 C)  SpO2: 100% 100%    Body mass index is 21.1 kg/m.  Intake/Output Summary (Last 24 hours) at 01/26/2019 1905 Last data filed at 01/26/2019 1800 Gross per 24 hour  Intake 5441.99 ml  Output 5595 ml  Net -153.01 ml     Intubated and sedated   No peripheral adenopathy  Lungs clear -- no rales or rhonchi  Heart regular rate and rhythm  Abdomen soft   No LE edema   CBG (last 3)  Recent Labs    01/26/19 0749 01/26/19 1121 01/26/19 1539  GLUCAP 115* 78 121*     Labs:  CBC Latest Ref Rng & Units 01/26/2019 01/25/2019 01/25/2019  WBC 4.0 - 10.5 K/uL 5.8 - 6.4  Hemoglobin 13.0 - 17.0 g/dL 12.0(L) 11.6(L) 13.6  Hematocrit 39.0 - 52.0 % 36.5(L) 34.0(L) 39.6  Platelets 150 - 400 K/uL 71(L) - 95(L)     Urine Studies No results for input(s): UHGB, CRYS in the last 72 hours.  Invalid input(s): UACOL, UAPR, USPG, UPH, UTP, UGL, UKET, UBIL, UNIT, UROB, ULEU, UEPI, UWBC, [place], [place], [place], [place], [place]  Basic Metabolic Panel: Recent Labs  Lab 01/24/19 0745  01/24/19 1735 01/25/19 0425 01/25/19 1115 01/25/19 1552 01/26/19 0341 01/26/19 1220 01/26/19 1547  NA 151*   < > 147* 144 141 142 142 141  --   K 3.1*   < > 2.9*  2.9* 3.1* 3.0* 3.1* 3.9  --   CL 125*  --  126* 120* 116*  --  119* 116*  --   CO2 16*  --  17* 18* 19*  --  17* 18*  --   GLUCOSE 125*  --  138* 124* 87  --  118* 100*  --   BUN 10  --  7* 6* 7*  --  9 8  --   CREATININE 1.73*  --  1.45* 1.31* 1.16  --  1.09 0.98  --   CALCIUM 7.0*  --  6.8* 6.7* 6.8*  --  7.1* 6.9*  --   MG 1.7  --   --  1.5*  --   --  1.3* 2.0 1.8  PHOS  --   --   --   --   --   --  <1.0* 2.0* 2.1*   < > = values in this interval not displayed.   GFR Estimated Creatinine Clearance: 67.1 mL/min (by C-G formula based on SCr of 0.98 mg/dL). Liver Function Tests: Recent Labs  Lab 12/30/2018 0255 01/24/19 0745 01/25/19 0425 01/26/19 0341 01/26/19 1220  AST 43* 30 40  --   --   ALT 46* 35 32  --   --  ALKPHOS 122 88 96  --   --   BILITOT 1.7* 0.9 0.5  --   --   PROT 5.7* 4.7* 4.5*  --   --   ALBUMIN 3.0* 2.1* 1.9* 1.8* 1.9*   Recent Labs  Lab 01/02/2019 0255  LIPASE 32   Recent Labs  Lab 01/12/2019 0302  AMMONIA 25   Coagulation profile No results for input(s): INR, PROTIME in the last 168 hours.  CBC: Recent Labs  Lab 12/29/2018 0255  01/24/19 0420 01/24/19 1145 01/25/19 0425 01/25/19 1552 01/26/19 0341  WBC 6.9  --  7.1  --  6.4  --  5.8  NEUTROABS 5.0  --   --   --   --   --   --   HGB 15.5   < > 14.9 12.9* 13.6 11.6* 12.0*  HCT 44.6   < > 44.6 38.0* 39.6 34.0* 36.5*  MCV 95.7  --  100.9*  --  98.8  --  101.1*  PLT 191  --  154  --  95*  --  71*   < > = values in this interval not displayed.   Cardiac Enzymes: Recent Labs  Lab 01/25/19 1805 01/26/19 0014 01/26/19 0619 01/26/19 1220 01/26/19 1812  TROPONINI 1.26* 1.59* 2.16* 2.24* 1.76*   BNP: Invalid input(s): POCBNP CBG: Recent Labs  Lab 01/26/19 0013 01/26/19 0338 01/26/19 0749 01/26/19 1121 01/26/19 1539  GLUCAP 89 106* 115* 78 121*   D-Dimer No results for input(s): DDIMER in the last 72 hours. Hgb A1c No results for input(s): HGBA1C in the last 72 hours. Lipid  Profile No results for input(s): CHOL, HDL, LDLCALC, TRIG, CHOLHDL, LDLDIRECT in the last 72 hours. Thyroid function studies No results for input(s): TSH, T4TOTAL, T3FREE, THYROIDAB in the last 72 hours.  Invalid input(s): FREET3 Anemia work up No results for input(s): VITAMINB12, FOLATE, FERRITIN, TIBC, IRON, RETICCTPCT in the last 72 hours. Microbiology Recent Results (from the past 240 hour(s))  Urine culture     Status: None   Collection Time: 01/13/2019  2:40 AM  Result Value Ref Range Status   Specimen Description URINE, RANDOM  Final   Special Requests NONE  Final   Culture   Final    NO GROWTH Performed at [facility], [address], [place], [place] [address]    Report Status 01/24/2019 FINAL  Final  Blood Culture (routine x 2)     Status: None (Preliminary result)   Collection Time: 01/13/2019  2:45 AM  Result Value Ref Range Status   Specimen Description BLOOD RIGHT FEMORAL ARTERY  Final   Special Requests   Final    BOTTLES DRAWN AEROBIC AND ANAEROBIC Blood Culture adequate volume   Culture   Final    NO GROWTH 3 DAYS Performed at [facility], [place] [address] [place], [place] [address]    Report Status PENDING  Incomplete  Blood Culture (routine x 2)     Status: None (Preliminary result)   Collection Time: 01/25/2019  5:00 AM  Result Value Ref Range Status   Specimen Description BLOOD RIGHT HAND  Final   Special Requests   Final    BOTTLES DRAWN AEROBIC AND ANAEROBIC Blood Culture results may not be optimal due to an inadequate volume of blood received in culture bottles   Culture   Final    NO GROWTH 3 DAYS Performed at [facility], [place] [address], [place], [place] [address]    Report  Status PENDING  Incomplete  MRSA PCR Screening     Status: None   Collection Time: 01/17/2019 12:17 PM  Result Value Ref Range Status   MRSA by PCR NEGATIVE NEGATIVE Final    Comment:        The GeneXpert MRSA Assay (FDA approved for NASAL specimens only), is  one component of a comprehensive MRSA colonization surveillance program. It is not intended to diagnose MRSA infection nor to guide or monitor treatment for MRSA infections. Performed at [facility], [place] [address] [place], [place] [address]   Culture, respiratory (non-expectorated)     Status: None   Collection Time: 01/15/2019  4:13 PM  Result Value Ref Range Status   Specimen Description TRACHEAL ASPIRATE  Final   Special Requests NONE  Final   Gram Stain   Final    RARE WBC PRESENT, PREDOMINANTLY PMN RARE GRAM POSITIVE RODS Performed at [facility], [place] [address], [place], [place] [address]    Culture FEW CANDIDA TROPICALIS  Final   Report Status 01/25/2019 FINAL  Final      Studies:  Dg Abd Portable 1v  Result Date: 01/25/2019 CLINICAL DATA:  Feeding tube placement. EXAM: PORTABLE ABDOMEN - 1 VIEW COMPARISON:  One-view abdomen 12/28/2018 FINDINGS: The tip of a small bore feeding tube is in the distal stomach, potentially the duodenal bulb. Right femoral line is stable. Dilated loops of small bowel in the left upper quadrant are again noted. Gallstones are present. IMPRESSION: 1. The tip of a small bore feeding tube is in the distal stomach, potentially the duodenum bulb. 2. Persistent dilated loops of small bowel in the left upper quadrant. Electronically Signed   By: San Morelle M.D.   On: 01/25/2019 11:03    Assessment: 68 y.o. with PMH of MM in remission, on maintenance Revlimid, immunocompromised, history of anterior MI and cardiomyopathy, presented with cardiac arrest, and septic shock after a few days of URI symptoms and nausea   1. Septic shock, on pressors and antibiotics  2.  Acute hypoxic respiratory failure, intubated and sedated 3.  Status post PEA arrest 4.  Acute encephalopathy, metabolic 5. Elevated troponin, demanding ischemia? 6.  Multiple myeloma, in remission, on maintenance Revlimid which has been held since admission 7.  Thrombocytopenia, started on 1/29, day 3 of admission, likely secondary to sepsis  8. Hypokalemia and metabolic acidosis    9. Aspiration pneumonia   Plan:  -I have reviewed his medications, labs, and images  -I agree with holding Revlimid for now patient is immunocompromised due to the Revlimid. 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  No clinical bleeding, I will check DIC panel. If plt drops below 50K, I will be very cautious about hepatin drip, in my opinion, heparin should be stopped when plt <40-50K, due to high risk of bleeding  -since pt is immunocompromised, we may need to consider bronchoscopy to rule out opportunistic infection if he is more hemodynamically stable -medical management per ICU team, I appreciate their excellent care  -I will f/u as needed, please call if anything I can be helpful.     Truitt Merle, MD 01/26/2019  7:05 PM

## 2019-01-26 NOTE — Progress Notes (Signed)
NAME:  Eric Dudley, MRN:  [id number], DOB:  March 23, 1951, LOS: 3 ADMISSION DATE:  01/14/2019, CONSULTATION DATE:  01/18/2019 REFERRING MD:  Dr. Leonides Schanz, CHIEF COMPLAINT:  AMS/ shock  Brief History   31 yoM with recent URI symptoms, not eating well, drinking well with 3 day hx of dizziness, syncopal episode, and ?vomiting.  Brief arrest vs syncopal episode with EMS with < 60 sec CPR.  In ER, remained hypotensive and agitated, requiring intubation for airway protection.  Required pressors for hypotension despite IVF, found to have AKI, hypokalemia, and LLL PNA.  Started empiric abx.  PCCM to admit.  History of present illness   HPI obtained from medical chart and by conversation with wife as patient is sedated and intubated on mechanical ventilation.   68 year old male with history of nonsmoker, Multiple myeloma (dx 2016, in remission on Revlimid), HTN, HLD, previous NICM with improved EF on recent TTE to 65%, CAD, hx tonsillar cancer.  Wife reports patient has had a cold for the last two weeks after catching it from her.  He recently went to doctor and placed on antibiotics as his symptoms and cough did not improve. States he has not ate well since but has been drinking well.  3 days ago, patient developed dizziness and had syncopal episode at home.  He continued to feel back and fell again last night. He starting complaining of leg and back pain and said he had been vomiting, although wife has not witnessed any vomiting.   Wife called EMS and had brief episode of apnea and pulseless and received CPR for < 60 secs prior to patient becoming conscious during transport.  In ER, patient remained confused and agitated.  He was intubated for airway protection.  He was profoundly hypotensive, despite 4L IVF requiring vasopressors.  He was mildly hypothermic, not hypoxic, and bradycardic with several pauses into the 30's while in ER.  CT head and cervical neg, CT abd performed showing left lower lobe pneumoina and  mild hydronephrosis and distended bladder.  Labs showed Lactic 6.9-> 4.6, K 2.2, sCr 2.65, WBC 6.9, mild transaminitis, UA pending.  EKG SB, with initial diffuse STD, no STE and prolonged QTc.  Started on empiric antibiotics and PCCM called for admission.   Past Medical History  Multiple myeloma (dx 2016, in remission on Revlimid), HTN, HLD, non smoker, previous NICM with improved EF on recent TTE to 65%, CAD, hx tonsillar cancer  Significant Hospital Events   1/27 Admit  Consults:   Procedures:  1/27 ETT >> 1/27 R femoral TL CVL >> 1/27 foley >>  Significant Diagnostic Tests:  01/09/2019 CTH/ cervical  >> 1. No acute intracranial abnormality. 2. Diffuse bone marrow disease consistent with multiple myeloma.  01/01/2019 CT abd/ pelvis >> 1. Aspiration or pneumonia in the left more than right lung base. 2. Bilateral hydronephrosis and hydroureter above an over distended bladder. 3. Nephrolithiasis and cholelithiasis. 4. Sigmoid epiploic appendagitis of indeterminate chronicity.  Micro Data:  1/27 BCx 2 >> no growth in 24-hour 1/27 urine cx >> no growth 1/27 Resp cx  >> gram-positive rods on prelim. 1/27 MRSA PCR >> negative  Antimicrobials:  1/27 vanc x 1 1/27 flagyl x 1 1/27 aztreonam x 1 1/27 azithromax  1/27 ceftriaxone >>  Interim history/subjective:  No significant event overnight.  Seems little agitated.  Objective   Blood pressure (!) 103/59, pulse 64, temperature 98.1 F (36.7 C), temperature source Core, resp. rate 18, height '5\' 10"'  (1.778 m),  weight 66.7 kg, SpO2 100 %.    Vent Mode: PRVC FiO2 (%):  [30 %] 30 % Set Rate:  [16 bmp] 16 bmp Vt Set:  [510 mL] 510 mL PEEP:  [5 cmH20] 5 cmH20 Plateau Pressure:  [12 cmH20-17 cmH20] 12 cmH20   Intake/Output Summary (Last 24 hours) at 01/26/2019 0814 Last data filed at 01/26/2019 0600 Gross per 24 hour  Intake 2874.08 ml  Output 4345 ml  Net -1470.92 ml   Filed Weights   01/24/19 0500 01/25/19 0500 01/26/19  0500  Weight: 64.1 kg 65.2 kg 66.7 kg    Examination: General: Thin critically ill adult male, sedated, on vent. HEENT: AT,  anicteric, ETT , no JVD, abrasion to nasal bridge and right forehead Neuro: sedated, open eyes and following simple commands. CV: Regular rate and rhythm , no murmur.   PULM: Clear bilaterally with decreased breath sound at bases. GI: soft, non-tender, bs active, foley w/cyu Extremities: Relatively cool lower extremities with no edema or cyanosis. Skin: no rashes, bilateral arms with bruising and wrapped in ACE bandages  Resolved Hospital Problem list    Assessment & Plan:  Shock- presumed septic, vs hypovolemia from dehydration vs cardiogenic  Tach acidosis resolved initially but patient continued to have non-anion gap metabolic acidosis, continue to require higher doses of pressors. Tube feeding was started yesterday via [facility].  On high doses of fentanyl. Repeat echo- with normal EF and moderately reduced RV function  And elevated PA. P:  Goal MAP >65 Continue levophed, and vasopressin. Start Precedex with heart rate monitoring and being off from fentanyl. We will hold off to tube feed till electrolyte abnormalities resolved. Start him on D5 with 3 ampules of bicarb.  Acute respiratory failure LLL infiltrate (immunosuppressed) P:  Full MV support. Wean FiO2 for sats > 92-99% VAP measures pepcid for SUP Continue with azithro and ceftriaxone for 5 days total, will finish course tomorrow.  Brief Cardiac arrest vs syncope episode with <60 sec CPR 1/27 - wife reports syncope episode x 2 at home with dizziness  Bradycardia- with pauses into the 30's Prolonged QTc- 0.559 Elevated Troponin-started trending up again,last 2.16 Heparin GTT was started last night-platelet trending down. - per cards notes, previously on midodrine for orthostatic hypotension since off P:  Cardiology consult. Tele monitor for ongoing arrhthymias/ ectopy/ pauses Goal K >2,  mag >2 Continue heparin GTT. Continue trending troponin. Wife wishes for no more CPR   Electrolyte abnormalities.  Persistent hypokalemia, hypophosphatemia, hypomagnesemia.  Hypernatremia resolved. Might be some element of refeeding syndrome. -Replete potassium, phosphorus and magnesium. -Monitor BMP, phosphorus and magnesium. -We are holding tube feeds to decrease any confounder, tube feed can be restarted once electrolyte abnormalities resolved.  Acute encephalopathy likely related to hypotension -CTH/ cervical neg P:  Monitor Ongoing neuro checks PAD protocol with fentanyl and Precedex w/ bowel regimen Daily WUA RASS goal 0/-1  AKI on CKD2 Bilateral mild hydronephrosis with distended bladder- s/p foley with ~1.2 L output. Renal ultrasound with no hydronephrosis. Creatinine continue to improve, at 1.09 today with baseline of 1.2. P:  Continue foley given critical illness Strict I/O's, daily wts, UOP trend Repeat renal function in am.  Multiple myeloma - in remission P:  Hold revlimid   Hx HTN, HLD P:  Hold zocor, losartan Continue daily ASA 63m  Hyperglycemia P:  CBG a 4, add SSI if > 180  Mild Transaminitis: Resolved - CT abd without acute process P:  Repeat LFTs in am    Best  practice:  Diet: NPO Pain/Anxiety/Delirium protocol (if indicated): fentanyl /Precedex VAP protocol (if indicated): yes DVT prophylaxis: SCDs/ heparin GI prophylaxis: pepcid Glucose control: CBG q 4 Mobility: BR Code Status: After lengthly discussion with wife, she states patient has recently told her he was so tired of feeling bad and didn't want to do this anymore.  She states he would not want this but is agreeable to continue short term support for now.  She states no CPR if despite all current aggressive measures he were to decompensate to this.  Orders change to reflect this  Family Communication:  Disposition: admit ICU  Labs   CBC: Recent Labs  Lab 01/20/2019 0255   01/24/19 0420 01/24/19 1145 01/25/19 0425 01/25/19 1552 01/26/19 0341  WBC 6.9  --  7.1  --  6.4  --  5.8  NEUTROABS 5.0  --   --   --   --   --   --   HGB 15.5   < > 14.9 12.9* 13.6 11.6* 12.0*  HCT 44.6   < > 44.6 38.0* 39.6 34.0* 36.5*  MCV 95.7  --  100.9*  --  98.8  --  101.1*  PLT 191  --  154  --  95*  --  71*   < > = values in this interval not displayed.    Basic Metabolic Panel: Recent Labs  Lab 01/14/2019 0255  01/07/2019 2342 01/24/19 0745  01/24/19 1735 01/25/19 0425 01/25/19 1115 01/25/19 1552 01/26/19 0341  NA 130*   < > 152* 151*   < > 147* 144 141 142 142  K 2.2*   < > 2.4* 3.1*   < > 2.9* 2.9* 3.1* 3.0* 3.1*  CL 92*   < > 125* 125*  --  126* 120* 116*  --  119*  CO2 20*   < > 19* 16*  --  17* 18* 19*  --  17*  GLUCOSE 165*   < > 142* 125*  --  138* 124* 87  --  118*  BUN 12   < > 9 10  --  7* 6* 7*  --  9  CREATININE 2.65*   < > 2.08* 1.73*  --  1.45* 1.31* 1.16  --  1.09  CALCIUM 8.7*   < > 7.5* 7.0*  --  6.8* 6.7* 6.8*  --  7.1*  MG 1.8  --  1.9 1.7  --   --  1.5*  --   --  1.3*  PHOS  --   --   --   --   --   --   --   --   --  <1.0*   < > = values in this interval not displayed.   GFR: Estimated Creatinine Clearance: 60.3 mL/min (by C-G formula based on SCr of 1.09 mg/dL). Recent Labs  Lab 01/06/2019 0255 01/27/2019 0500 01/03/2019 0736 01/24/19 0420 01/25/19 0425 01/26/19 0341  WBC 6.9  --   --  7.1 6.4 5.8  LATICACIDVEN 6.9* 4.6* 1.7  --   --   --     Liver Function Tests: Recent Labs  Lab 01/27/2019 0255 01/24/19 0745 01/25/19 0425 01/26/19 0341  AST 43* 30 40  --   ALT 46* 35 32  --   ALKPHOS 122 88 96  --   BILITOT 1.7* 0.9 0.5  --   PROT 5.7* 4.7* 4.5*  --   ALBUMIN 3.0* 2.1* 1.9* 1.8*   Recent Labs  Lab 01/09/2019  0255  LIPASE 32   Recent Labs  Lab 01/25/2019 0302  AMMONIA 25    ABG    Component Value Date/Time   PHART 7.312 (L) 01/25/2019 1552   PCO2ART 36.0 01/25/2019 1552   PO2ART 118.0 (H) 01/25/2019 1552   HCO3 18.3  (L) 01/25/2019 1552   TCO2 19 (L) 01/25/2019 1552   ACIDBASEDEF 7.0 (H) 01/25/2019 1552   O2SAT 98.0 01/25/2019 1552     Coagulation Profile: No results for input(s): INR, PROTIME in the last 168 hours.  Cardiac Enzymes: Recent Labs  Lab 01/25/19 0425 01/25/19 1115 01/25/19 1805 01/26/19 0014 01/26/19 0619  TROPONINI 0.64* 0.85* 1.26* 1.59* 2.16*    HbA1C: No results found for: HGBA1C  CBG: Recent Labs  Lab 01/25/19 1616 01/25/19 1912 01/26/19 0013 01/26/19 0338 01/26/19 0749  GLUCAP 90 72 89 106* 115*    Review of Systems:   Unable to obtain  Past Medical History  He,  has a past medical history of Arthritis, Cholelithiases, Chronic kidney disease, Hyperlipidemia, Hypertension, Multiple myeloma ([place]), and Tonsillar cancer ([place]) (2006).   Surgical History    Past Surgical History:  Procedure Laterality Date  . APPENDECTOMY  1962  . CARDIAC CATHETERIZATION N/A 04/30/2015   Procedure: Left Heart Cath and Coronary Angiography;  Surgeon: Peter M Martinique, MD;  Location: [facility] INVASIVE CV LAB CUPID;  Service: Cardiovascular;  Laterality: N/A;  . CARDIOLITE MYOCARDIAL PERFUSION STUDY  04/16/03   NEGITIVE BRUCE PROTOCAL EXERCISE STRESS TEST. EF 70%. NO ISCHEMIA.  [name] KNEE SURGERY  2012   right  . [place]   left     Social History   reports that he quit smoking about 15 years ago. His smoking use included cigars. He quit smokeless tobacco use about 24 years ago.  His smokeless tobacco use included chew. He reports that he does not drink alcohol or use drugs.   Family History   His family history includes Cancer in his father and sister; Cancer (age of onset: 36) in his mother; Hypertension in his sister.   Allergies Allergies  Allergen Reactions  . Hydrochlorothiazide Rash and Palpitations  . Asa [Aspirin] Nausea Only  . Calcium Carbonate Nausea And Vomiting  . Oxycodone Other (See Comments)    Patient does not want to take. Severe Hallucinations.    . Amoxicillin Itching and Rash    Did it involve swelling of the face/tongue/throat, SOB, or low BP? Unknown Did it involve sudden or severe rash/hives, skin peeling, or any reaction on the inside of your mouth or nose? Unknown Did you need to seek medical attention at a hospital or doctor's office? Unknown When did it last happen? Unk If all above answers are "NO", may proceed with cephalosporin use.   . Indapamide Palpitations    Lightheadedness, dizziness     Home Medications  Prior to Admission medications   Medication Sig Start Date End Date Taking? Authorizing Provider  acetaminophen-codeine (TYLENOL #3) 300-30 MG tablet Take 1 tablet by mouth every 8 (eight) hours as needed for severe pain. 10/13/18   Truitt Merle, MD  aspirin 81 MG chewable tablet Chew 1 tablet (81 mg total) by mouth daily. 05/01/15   Caren Griffins, MD  calcium carbonate (OS-CAL) 600 MG TABS tablet Take 1,200 mg by mouth at bedtime.     [provider]  Cholecalciferol (VITAMIN D3) 1000 units CAPS Take 2,000 mg by mouth daily.    [provider]  losartan (COZAAR) 25 MG tablet Take  1 tablet (25 mg total) by mouth daily. 01/12/19 04/12/19  Hilty, Nadean Corwin, MD  Multiple Vitamins-Minerals (CENTRUM SILVER ULTRA MENS PO) Take 1 tablet by mouth at bedtime.     [provider]  naftifine (NAFTIN) 1 % cream Apply 1 application topically daily as needed.    [provider]  nitroGLYCERIN (NITROSTAT) 0.4 MG SL tablet Place 0.4 mg under the tongue every 5 (five) minutes x 3 doses as needed for chest pain. Reported on 05/19/2016    [provider]  ondansetron (ZOFRAN) 4 MG tablet TAKE 1 TABLET FOUR TIMES A DAY AS NEEDED FOR NAUSEA OR VOMITING 12/08/18   Truitt Merle, MD  potassium chloride (MICRO-K) 10 MEQ CR capsule TAKE 3 CAPSULES DAILY 11/08/18   Truitt Merle, MD  ranitidine (ZANTAC) 150 MG tablet Take 2 tablets (300 mg total) by mouth at bedtime. 05/24/15   Truitt Merle, MD  REVLIMID  5 MG capsule Take 1 capsule (5 mg total) by mouth as directed. Take 5 mg daily for 21 days, rest 7 days. 12/30/18   Truitt Merle, MD  simvastatin (ZOCOR) 20 MG tablet Take 20 mg by mouth daily.    [provider]  vitamin B-12 (CYANOCOBALAMIN) 500 MCG tablet Take 1 tablet by mouth daily.    [provider]  zolpidem (AMBIEN) 5 MG tablet Take 1 tablet (5 mg total) by mouth at bedtime as needed for sleep. 04/26/17   Theodis Blaze, MD         Lorella Nimrod MD PGY3 Pager [phone] 01/26/2019, 8:14 AM

## 2019-01-26 NOTE — Progress Notes (Signed)
CRITICAL VALUE ALERT  Critical Value: Troponin 1.59  Date & Time Notied: 01/26/2019 01:39  Provider Notified: Warren Lacy MD   Orders Received/Actions taken: MD made aware

## 2019-01-26 NOTE — Care Management Important Message (Signed)
Important Message  Patient Details  Name: Eric Dudley MRN: [id number] Date of Birth: Dec 25, 1951   Medicare Important Message Given:  Yes    Orbie Pyo 01/26/2019, 3:19 PM

## 2019-01-26 NOTE — Progress Notes (Signed)
eLink Physician-Brief Progress Note Patient Name: Eric Dudley DOB: 15-Nov-1951 MRN: [id number]   Date of Service  01/26/2019  HPI/Events of Note  Patient appears agitated despite Fentanyl 350.  QTc 480  eICU Interventions  Ordered Ativan 1 mg q 4 prn for agitation     Intervention Category Minor Interventions: Agitation / anxiety - evaluation and management  Judd Lien 01/26/2019, 11:48 PM

## 2019-01-27 ENCOUNTER — Inpatient Hospital Stay (HOSPITAL_COMMUNITY): Payer: Medicare Other

## 2019-01-27 DIAGNOSIS — L8990 Pressure ulcer of unspecified site, unspecified stage: Secondary | ICD-10-CM

## 2019-01-27 LAB — GLUCOSE, CAPILLARY
GLUCOSE-CAPILLARY: 92 mg/dL (ref 70–99)
Glucose-Capillary: 49 mg/dL — ABNORMAL LOW (ref 70–99)
Glucose-Capillary: 80 mg/dL (ref 70–99)
Glucose-Capillary: 92 mg/dL (ref 70–99)
Glucose-Capillary: 94 mg/dL (ref 70–99)
Glucose-Capillary: 95 mg/dL (ref 70–99)
Glucose-Capillary: 97 mg/dL (ref 70–99)

## 2019-01-27 LAB — CBC
HCT: 35.2 % — ABNORMAL LOW (ref 39.0–52.0)
Hemoglobin: 11.9 g/dL — ABNORMAL LOW (ref 13.0–17.0)
MCH: 34.2 pg — ABNORMAL HIGH (ref 26.0–34.0)
MCHC: 33.8 g/dL (ref 30.0–36.0)
MCV: 101.1 fL — ABNORMAL HIGH (ref 80.0–100.0)
Platelets: 80 10*3/uL — ABNORMAL LOW (ref 150–400)
RBC: 3.48 MIL/uL — ABNORMAL LOW (ref 4.22–5.81)
RDW: 14.9 % (ref 11.5–15.5)
WBC: 5.8 10*3/uL (ref 4.0–10.5)
nRBC: 0 % (ref 0.0–0.2)

## 2019-01-27 LAB — BASIC METABOLIC PANEL
ANION GAP: 7 (ref 5–15)
BUN: 6 mg/dL — ABNORMAL LOW (ref 8–23)
CO2: 22 mmol/L (ref 22–32)
Calcium: 7.7 mg/dL — ABNORMAL LOW (ref 8.9–10.3)
Chloride: 116 mmol/L — ABNORMAL HIGH (ref 98–111)
Creatinine, Ser: 1.01 mg/dL (ref 0.61–1.24)
GFR calc Af Amer: >60 mL/min (ref >60)
GFR calc non Af Amer: >60 mL/min (ref >60)
Glucose, Bld: 118 mg/dL — ABNORMAL HIGH (ref 70–99)
POTASSIUM: 3.3 mmol/L — AB (ref 3.5–5.1)
Sodium: 145 mmol/L (ref 135–145)

## 2019-01-27 LAB — RENAL FUNCTION PANEL
Albumin: 2 g/dL — ABNORMAL LOW (ref 3.5–5.0)
Albumin: 2.2 g/dL — ABNORMAL LOW (ref 3.5–5.0)
Anion gap: 7 (ref 5–15)
Anion gap: 7 (ref 5–15)
BUN: 6 mg/dL — ABNORMAL LOW (ref 8–23)
BUN: 7 mg/dL — ABNORMAL LOW (ref 8–23)
CHLORIDE: 122 mmol/L — AB (ref 98–111)
CO2: 21 mmol/L — ABNORMAL LOW (ref 22–32)
CO2: 21 mmol/L — ABNORMAL LOW (ref 22–32)
CREATININE: 1 mg/dL (ref 0.61–1.24)
Calcium: 7.8 mg/dL — ABNORMAL LOW (ref 8.9–10.3)
Calcium: 8.5 mg/dL — ABNORMAL LOW (ref 8.9–10.3)
Chloride: 117 mmol/L — ABNORMAL HIGH (ref 98–111)
Creatinine, Ser: 1.16 mg/dL (ref 0.61–1.24)
GFR calc Af Amer: >60 mL/min (ref >60)
GFR calc Af Amer: >60 mL/min (ref >60)
Glucose, Bld: 113 mg/dL — ABNORMAL HIGH (ref 70–99)
Glucose, Bld: 106 mg/dL — ABNORMAL HIGH (ref 70–99)
Phosphorus: 2.4 mg/dL — ABNORMAL LOW (ref 2.5–4.6)
Phosphorus: 2.3 mg/dL — ABNORMAL LOW (ref 2.5–4.6)
Potassium: 3 mmol/L — ABNORMAL LOW (ref 3.5–5.1)
Potassium: 3.6 mmol/L (ref 3.5–5.1)
Sodium: 145 mmol/L (ref 135–145)
Sodium: 150 mmol/L — ABNORMAL HIGH (ref 135–145)

## 2019-01-27 LAB — MAGNESIUM
MAGNESIUM: 2.1 mg/dL (ref 1.7–2.4)
Magnesium: 1.5 mg/dL — ABNORMAL LOW (ref 1.7–2.4)
Magnesium: 2.1 mg/dL (ref 1.7–2.4)

## 2019-01-27 LAB — DIC (DISSEMINATED INTRAVASCULAR COAGULATION)PANEL
Fibrinogen: 635 mg/dL — ABNORMAL HIGH (ref 210–475)
INR: 1.1
Platelets: 79 10*3/uL — ABNORMAL LOW (ref 150–400)
Prothrombin Time: 14.1 seconds (ref 11.4–15.2)
Smear Review: NONE SEEN
aPTT: 68 seconds — ABNORMAL HIGH (ref 24–36)

## 2019-01-27 LAB — DIC (DISSEMINATED INTRAVASCULAR COAGULATION) PANEL: D DIMER QUANT: 2.77 ug{FEU}/mL — AB (ref 0.00–0.50)

## 2019-01-27 LAB — TROPONIN I: TROPONIN I: 1.4 ng/mL — AB (ref <0.03)

## 2019-01-27 MED ORDER — FREE WATER
100.0000 mL | Freq: Three times a day (TID) | Status: DC
  Administered 2019-01-27 – 2019-01-28 (×3): 100 mL

## 2019-01-27 MED ORDER — POTASSIUM PHOSPHATES 15 MMOLE/5ML IV SOLN
20.0000 meq | Freq: Once | INTRAVENOUS | Status: AC
  Administered 2019-01-27: 20 meq via INTRAVENOUS
  Filled 2019-01-27 (×2): qty 4.55

## 2019-01-27 MED ORDER — VITAL AF 1.2 CAL PO LIQD
1000.0000 mL | ORAL | Status: DC
  Administered 2019-01-27: 1000 mL

## 2019-01-27 MED ORDER — POTASSIUM CHLORIDE 10 MEQ/50ML IV SOLN
10.0000 meq | INTRAVENOUS | Status: AC
  Administered 2019-01-27 (×6): 10 meq via INTRAVENOUS
  Filled 2019-01-27 (×6): qty 50

## 2019-01-27 MED ORDER — LACTATED RINGERS IV BOLUS
500.0000 mL | Freq: Once | INTRAVENOUS | Status: AC
  Administered 2019-01-27: 500 mL via INTRAVENOUS

## 2019-01-27 MED ORDER — DEXTROSE 50 % IV SOLN
INTRAVENOUS | Status: AC
  Filled 2019-01-27: qty 50

## 2019-01-27 MED ORDER — QUETIAPINE FUMARATE 50 MG PO TABS
50.0000 mg | ORAL_TABLET | Freq: Every day | ORAL | Status: DC
  Administered 2019-01-27 – 2019-01-28 (×2): 50 mg via ORAL
  Filled 2019-01-27 (×2): qty 1

## 2019-01-27 MED ORDER — POLYETHYLENE GLYCOL 3350 17 G PO PACK
17.0000 g | PACK | Freq: Every day | ORAL | Status: DC
  Administered 2019-01-27 – 2019-01-29 (×3): 17 g via ORAL
  Filled 2019-01-27 (×4): qty 1

## 2019-01-27 MED ORDER — MAGNESIUM SULFATE 4 GM/100ML IV SOLN
4.0000 g | Freq: Once | INTRAVENOUS | Status: AC
  Administered 2019-01-27: 4 g via INTRAVENOUS
  Filled 2019-01-27: qty 100

## 2019-01-27 NOTE — Progress Notes (Signed)
eLink Physician-Brief Progress Note Patient Name: FAIZAN GERACI DOB: September 10, 1951 MRN: [id number]   Date of Service  01/27/2019  HPI/Events of Note  Notified of Na 150.  Patient on bicarbonate drip 75/hr. Urine output 200 cc/hr. CO2 21, Cl 122, K 3.6  eICU Interventions  Discontinue bicarbonate drip. Start free water flushes 100 cc q 4 hours to replace losses.     Intervention Category Major Interventions: Electrolyte abnormality - evaluation and management  Judd Lien 01/27/2019, 10:52 PM

## 2019-01-27 NOTE — Progress Notes (Addendum)
Nutrition Follow-up  DOCUMENTATION CODES:   Severe malnutrition in context of chronic illness  INTERVENTION:   Tube Feeding: Recommend resuming Vital AF 1.2 @ 20 ml/hr Titrate by 10 mL q 8 hours until goal rate of 60 ml/hr Vital AF 1.2 Goal rate of 60 ml/hr provides 1728 kcals, 108 g of protein, 1210 mL of free water Meets 100% estimated protein and calorie needs  Continue to monitor magnesium, potassium and phosphorus and aggressively replete   NUTRITION DIAGNOSIS:   Severe Malnutrition related to chronic illness(Multiple Myeloma) as evidenced by severe fat depletion, severe muscle depletion.  Being addressed via TF   GOAL:   Patient will meet greater than or equal to 90% of their needs  Progressing  MONITOR:   TF tolerance, Labs, Weight trends, Vent status  REASON FOR ASSESSMENT:   Ventilator    ASSESSMENT:   68 yo male admitted with shock (septic vs hypovolemic from dehydration vs cardiogenic), acute respiratory failure requiring intubation, acute encephalopathy, AKI on CKD PMH includes multiple myeloma on oral chemo, HTN, HLD, CAD, hx of tonsillar cancer   Patient is currently intubated on ventilator support MV: 7.3 L/min Temp (24hrs), Avg:98.1 F (36.7 C), Min:97.5 F (36.4 C), Max:98.6 F (37 C)  Pt with signs of refeeding. Vital AF 1.2 started at 30 ml/hr on 1/29. TF held by MD yesterday. Refeeding syndrome is usually managed by slowly introducing nutrition (starting TF at rate of 20 ml/hr or reducing rate to 20 ml/hr) while aggressively supplementing electrolytes.  Electrolytes greatly improved this AM  Current wt 65.5 kg; up from 61.7 kg. Mild edema present on exam  Labs: potassium 3.0, phosphorus 2.4, magnesium 1.5; CBGs 49-92 Meds: fentanyl, vasopressin, levophed   Diet Order:   Diet Order            Diet NPO time specified  Diet effective now              EDUCATION NEEDS:   Not appropriate for education at this time  Skin:  Skin  Assessment: Skin Integrity Issues: Skin Integrity Issues:: DTI DTI: upper lip  Last BM:  PTA  Height:   Ht Readings from Last 1 Encounters:  01/26/2019 _0  (1.778 m)    Weight:   Wt Readings from Last 1 Encounters:  01/27/19 65.5 kg    Ideal Body Weight:  75.5 kg  BMI:  Body mass index is 20.72 kg/m.  Estimated Nutritional Needs:   Kcal:  1778 kcals   Protein:  93-124 g  Fluid:  >/= 1.7 L   Kerman Passey MS, RD, LDN, CNSC [phone] Pager  [phone] Weekend/On-Call Pager

## 2019-01-27 NOTE — Progress Notes (Signed)
DAILY PROGRESS NOTE   Patient Name: Eric Dudley Date of Encounter: 01/27/2019 Cardiologist: Pixie Casino, MD  Chief Complaint   Intubated, sedated on vent  Patient Profile   Eric Dudley is a 68 y.o. 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 Loanne Drilling  Subjective   Troponin declining - agitated overnight, but more with it today. Attempt at SBT apparently failed due to apnea. Noted to have some atrial bigeminy overnight. Significant electrolyte abnormalities persist including hypomagnesemia, hypokalemia and hypophosphatemia that require correction. Also with decline, but stable platelet count - some features of DIC on labs with elevated d-dimer, fibrinogen, but normal INR.  Objective   Vitals:   01/27/19 0500 01/27/19 0600 01/27/19 0700 01/27/19 0747  BP:    128/66  Pulse: (!) 40 (!) 56 66 76  Resp: '16 16 16 16  ' Temp: 98.2 F (36.8 C) 98.2 F (36.8 C)    TempSrc:      SpO2: 100% 100% 100% 100%  Weight:      Height:        Intake/Output Summary (Last 24 hours) at 01/27/2019 0853 Last data filed at 01/27/2019 0600 Gross per 24 hour  Intake 4451.24 ml  Output 5850 ml  Net -1398.76 ml   Filed Weights   01/25/19 0500 01/26/19 0500 01/27/19 0436  Weight: 65.2 kg 66.7 kg 65.5 kg    Physical Exam   General appearance: intubated, sedated on vent, opens eyes spontaneously Neck: JVD - a few cm above sternal notch, no carotid bruit and thyroid not enlarged, symmetric, no tenderness/mass/nodules Lungs: diminished breath sounds LLL Heart: regular rate and rhythm Abdomen: soft, non-tender; bowel sounds normal; no masses,  no organomegaly Extremities: extremities normal, atraumatic, no  cyanosis or edema Pulses: 1+ pulses Skin: pale, cool, dry Neurologic: Mental status: intubated, sedated on vent, agitated, opens eyes spontaneously and responds to voice Psych: Cannot assess  Inpatient Medications    Scheduled Meds: . aspirin  81 mg Oral Daily  . chlorhexidine gluconate (MEDLINE KIT)  15 mL Mouth Rinse BID  . clopidogrel  75 mg Oral Daily  . dextrose      . free water  200 mL Per Tube Q4H  . mouth rinse  15 mL Mouth Rinse 10 times per day    Continuous Infusions: . sodium chloride Stopped (01/26/19 0754)  . azithromycin 500 mg (01/27/19 0819)  . cefTRIAXone (ROCEPHIN)  IV Stopped (01/26/19 8119)  . dexmedetomidine (PRECEDEX) IV infusion Stopped (01/26/19 1900)  . famotidine (PEPCID) IV Stopped (01/26/19 1053)  . fentaNYL infusion INTRAVENOUS 250 mcg/hr (01/27/19 0600)  . heparin 800 Units/hr (01/27/19 0127)  . magnesium sulfate 1 - 4 g bolus IVPB 4 g (01/27/19 0729)  . norepinephrine (LEVOPHED) Adult infusion 9 mcg/min (01/27/19 0600)  . potassium chloride 10 mEq (01/27/19 0826)  . potassium PHOSPHATE IVPB (mEq)    .  sodium bicarbonate  infusion 1000 mL 75 mL/hr at 01/27/19 0600  . vasopressin (PITRESSIN) infusion - *FOR SHOCK* 0.03 Units/min (01/27/19 0600)    PRN Meds: sodium chloride, albuterol, fentaNYL, haloperidol lactate, LORazepam   Labs   Results for orders placed or performed during the hospital encounter of 01/16/2019 (from the past 48  hour(s))  Basic metabolic panel     Status: Abnormal   Collection Time: 01/25/19 11:15 AM  Result Value Ref Range   Sodium 141 135 - 145 mmol/L   Potassium 3.1 (L) 3.5 - 5.1 mmol/L   Chloride 116 (H) 98 - 111 mmol/L   CO2 19 (L) 22 - 32 mmol/L   Glucose, Bld 87 70 - 99 mg/dL   BUN 7 (L) 8 - 23 mg/dL   Creatinine, Ser 1.16 0.61 - 1.24 mg/dL   Calcium 6.8 (L) 8.9 - 10.3 mg/dL   GFR calc non Af Amer >60 >60 mL/min   GFR calc Af Amer >60 >60 mL/min   Anion gap 6 5 - 15    Comment: Performed at [facility] [address] [place], [place] [address]  Troponin I - Now Then Q6H     Status: Abnormal   Collection Time: 01/25/19 11:15 AM  Result Value Ref Range   Troponin I 0.85 (HH) <0.03 ng/mL    Comment: CRITICAL VALUE NOTED.  VALUE IS CONSISTENT WITH PREVIOUSLY REPORTED AND CALLED VALUE. Performed at [facility], [place] [address] [place], [place] [address]   Glucose, capillary     Status: None   Collection Time: 01/25/19 12:01 PM  Result Value Ref Range   Glucose-Capillary 81 70 - 99 mg/dL  I-STAT 7, (LYTES, BLD GAS, ICA, H+H)     Status: Abnormal   Collection Time: 01/25/19  3:52 PM  Result Value Ref Range   pH, Arterial 7.312 (L) 7.350 - 7.450   pCO2 arterial 36.0 32.0 - 48.0 mmHg   pO2, Arterial 118.0 (H) 83.0 - 108.0 mmHg   Bicarbonate 18.3 (L) 20.0 - 28.0 mmol/L   TCO2 19 (L) 22 - 32 mmol/L   O2 Saturation 98.0 %   Acid-base deficit 7.0 (H) 0.0 - 2.0 mmol/L   Sodium 142 135 - 145 mmol/L   Potassium 3.0 (L) 3.5 - 5.1 mmol/L   Calcium, Ion 1.14 (L) 1.15 - 1.40 mmol/L   HCT 34.0 (L) 39.0 - 52.0 %   Hemoglobin 11.6 (L) 13.0 - 17.0 g/dL   Patient temperature 36.5 C    Collection site ARTERIAL LINE    Drawn by Operator    Sample type ARTERIAL   Glucose, capillary     Status: None   Collection Time: 01/25/19  4:16 PM  Result Value Ref Range   Glucose-Capillary 90 70 - 99 mg/dL  Troponin I - Now Then Q6H     Status: Abnormal   Collection Time: 01/25/19  6:05 PM  Result Value Ref Range   Troponin I 1.26 (HH) <0.03 ng/mL    Comment: CRITICAL VALUE NOTED.  VALUE IS CONSISTENT WITH PREVIOUSLY REPORTED AND CALLED VALUE. Performed at [facility], [place] [address], [place], [place] [address]   Glucose, capillary     Status: None   Collection Time: 01/25/19  7:12 PM  Result Value Ref Range   Glucose-Capillary 72 70 - 99 mg/dL  Glucose, capillary     Status: None   Collection Time: 01/26/19 12:13 AM  Result Value Ref Range   Glucose-Capillary 89 70 - 99 mg/dL    Troponin I - Now Then Q6H     Status: Abnormal   Collection Time: 01/26/19 12:14 AM  Result Value Ref Range   Troponin I 1.59 (HH) <0.03 ng/mL    Comment: CRITICAL VALUE NOTED.  VALUE IS CONSISTENT WITH PREVIOUSLY REPORTED AND CALLED VALUE. Performed at  [facility], [place] [address], [place], [place] [address]   Glucose, capillary     Status: Abnormal   Collection Time: 01/26/19  3:38 AM  Result Value Ref Range   Glucose-Capillary 106 (H) 70 - 99 mg/dL  Renal function panel     Status: Abnormal   Collection Time: 01/26/19  3:41 AM  Result Value Ref Range   Sodium 142 135 - 145 mmol/L   Potassium 3.1 (L) 3.5 - 5.1 mmol/L   Chloride 119 (H) 98 - 111 mmol/L   CO2 17 (L) 22 - 32 mmol/L   Glucose, Bld 118 (H) 70 - 99 mg/dL   BUN 9 8 - 23 mg/dL   Creatinine, Ser 1.09 0.61 - 1.24 mg/dL   Calcium 7.1 (L) 8.9 - 10.3 mg/dL   Phosphorus <1.0 (LL) 2.5 - 4.6 mg/dL    Comment: CRITICAL RESULT CALLED TO, READ BACK BY AND VERIFIED WITH: YOUSEF M,RN 01/26/19 0440 WAYK    Albumin 1.8 (L) 3.5 - 5.0 g/dL   GFR calc non Af Amer >60 >60 mL/min   GFR calc Af Amer >60 >60 mL/min   Anion gap 6 5 - 15    Comment: Performed at [facility], [place] [address], [place], [place] [address]  CBC     Status: Abnormal   Collection Time: 01/26/19  3:41 AM  Result Value Ref Range   WBC 5.8 4.0 - 10.5 K/uL   RBC 3.61 (L) 4.22 - 5.81 MIL/uL   Hemoglobin 12.0 (L) 13.0 - 17.0 g/dL   HCT 36.5 (L) 39.0 - 52.0 %   MCV 101.1 (H) 80.0 - 100.0 fL   MCH 33.2 26.0 - 34.0 pg   MCHC 32.9 30.0 - 36.0 g/dL   RDW 14.6 11.5 - 15.5 %   Platelets 71 (L) 150 - 400 K/uL    Comment: REPEATED TO VERIFY Immature Platelet Fraction may be clinically indicated, consider ordering this additional test [id number] CONSISTENT WITH PREVIOUS RESULT    nRBC 0.0 0.0 - 0.2 %    Comment: Performed at [facility], [place] [address] [place], [place] [address]  Magnesium     Status: Abnormal   Collection Time: 01/26/19  3:41 AM   Result Value Ref Range   Magnesium 1.3 (L) 1.7 - 2.4 mg/dL    Comment: Performed at [facility] [address], [place], [place] [address]  Troponin I - Now Then Q6H     Status: Abnormal   Collection Time: 01/26/19  6:19 AM  Result Value Ref Range   Troponin I 2.16 (HH) <0.03 ng/mL    Comment: CRITICAL VALUE NOTED.  VALUE IS CONSISTENT WITH PREVIOUSLY REPORTED AND CALLED VALUE. Performed at [facility], [place] [address] [place], [place] [address]   Heparin level (unfractionated)     Status: None   Collection Time: 01/26/19  6:19 AM  Result Value Ref Range   Heparin Unfractionated 0.35 0.30 - 0.70 IU/mL    Comment: (NOTE) If heparin results are below expected values, and patient dosage has  been confirmed, suggest follow up testing of antithrombin III levels. Performed at [facility], [place] [address] [place], [place] [address]   Glucose, capillary     Status: Abnormal   Collection Time: 01/26/19  7:49 AM  Result Value Ref Range   Glucose-Capillary 115 (H) 70 - 99 mg/dL  Glucose, capillary     Status: None   Collection Time: 01/26/19 11:21 AM  Result Value Ref Range   Glucose-Capillary 78 70 - 99 mg/dL  Renal function panel     Status: Abnormal   Collection Time: 01/26/19 12:20 PM  Result Value Ref Range   Sodium 141 135 - 145 mmol/L   Potassium 3.9 3.5 - 5.1 mmol/L    Comment: NO VISIBLE HEMOLYSIS   Chloride 116 (H) 98 - 111 mmol/L   CO2 18 (L) 22 - 32 mmol/L   Glucose, Bld 100 (H) 70 - 99 mg/dL   BUN 8 8 - 23 mg/dL   Creatinine, Ser 0.98 0.61 - 1.24 mg/dL   Calcium 6.9 (L) 8.9 - 10.3 mg/dL   Phosphorus 2.0 (L) 2.5 - 4.6 mg/dL   Albumin 1.9 (L) 3.5 - 5.0 g/dL   GFR calc non Af Amer >60 >60 mL/min   GFR calc Af Amer >60 >60 mL/min   Anion gap 7 5 - 15    Comment: Performed at [facility] [address], [place], [place] [address]  Troponin I - Now Then Q6H     Status: Abnormal   Collection Time: 01/26/19 12:20 PM  Result Value Ref Range    Troponin I 2.24 (HH) <0.03 ng/mL    Comment: CRITICAL VALUE NOTED.  VALUE IS CONSISTENT WITH PREVIOUSLY REPORTED AND CALLED VALUE. Performed at [facility], [place] [address] [place], [place] [address]   Magnesium     Status: None   Collection Time: 01/26/19 12:20 PM  Result Value Ref Range   Magnesium 2.0 1.7 - 2.4 mg/dL    Comment: Performed at [facility] [address], [place], [place] [address]  Heparin level (unfractionated)     Status: None   Collection Time: 01/26/19  1:01 PM  Result Value Ref Range   Heparin Unfractionated 0.34 0.30 - 0.70 IU/mL    Comment: (NOTE) If heparin results are below expected values, and patient dosage has  been confirmed, suggest follow up testing of antithrombin III levels. Performed at [facility], [place] [address], [place], [place] [address]   Glucose, capillary     Status: Abnormal   Collection Time: 01/26/19  3:39 PM  Result Value Ref Range   Glucose-Capillary 121 (H) 70 - 99 mg/dL  Magnesium     Status: None   Collection Time: 01/26/19  3:47 PM  Result Value Ref Range   Magnesium 1.8 1.7 - 2.4 mg/dL    Comment: Performed at [facility], [name] [address], [place], [place] [address]  Phosphorus     Status: Abnormal   Collection Time: 01/26/19  3:47 PM  Result Value Ref Range   Phosphorus 2.1 (L) 2.5 - 4.6 mg/dL    Comment: Performed at [facility] [address], [place], [place] [address]  Troponin I - Now Then Q6H     Status: Abnormal   Collection Time: 01/26/19  6:12 PM  Result Value Ref Range   Troponin I 1.76 (HH) <0.03 ng/mL    Comment: CRITICAL VALUE NOTED.  VALUE IS CONSISTENT WITH PREVIOUSLY REPORTED AND CALLED VALUE. Performed at [facility], [place] [address] [place], [place] [address]   Glucose, capillary     Status: None   Collection Time: 01/26/19  8:13 PM  Result Value Ref Range   Glucose-Capillary 99 70 - 99 mg/dL  Glucose, capillary     Status: None   Collection Time: 01/27/19  12:37 AM  Result Value Ref Range   Glucose-Capillary  94 70 - 99 mg/dL  Troponin I - Now Then Q6H     Status: Abnormal   Collection Time: 01/27/19  1:15 AM  Result Value Ref Range   Troponin I 1.40 (HH) <0.03 ng/mL    Comment: CRITICAL VALUE NOTED.  VALUE IS CONSISTENT WITH PREVIOUSLY REPORTED AND CALLED VALUE. Performed at [facility], [place] [address], [place], [place] [address]   Renal function panel     Status: Abnormal   Collection Time: 01/27/19  4:10 AM  Result Value Ref Range   Sodium 145 135 - 145 mmol/L   Potassium 3.0 (L) 3.5 - 5.1 mmol/L   Chloride 117 (H) 98 - 111 mmol/L   CO2 21 (L) 22 - 32 mmol/L   Glucose, Bld 113 (H) 70 - 99 mg/dL   BUN 6 (L) 8 - 23 mg/dL   Creatinine, Ser 1.00 0.61 - 1.24 mg/dL   Calcium 7.8 (L) 8.9 - 10.3 mg/dL   Phosphorus 2.4 (L) 2.5 - 4.6 mg/dL   Albumin 2.0 (L) 3.5 - 5.0 g/dL   GFR calc non Af Amer >60 >60 mL/min   GFR calc Af Amer >60 >60 mL/min   Anion gap 7 5 - 15    Comment: Performed at [facility], [address], [place], [place] [address]  CBC     Status: Abnormal   Collection Time: 01/27/19  4:10 AM  Result Value Ref Range   WBC 5.8 4.0 - 10.5 K/uL   RBC 3.48 (L) 4.22 - 5.81 MIL/uL   Hemoglobin 11.9 (L) 13.0 - 17.0 g/dL   HCT 35.2 (L) 39.0 - 52.0 %   MCV 101.1 (H) 80.0 - 100.0 fL   MCH 34.2 (H) 26.0 - 34.0 pg   MCHC 33.8 30.0 - 36.0 g/dL   RDW 14.9 11.5 - 15.5 %   Platelets 80 (L) 150 - 400 K/uL    Comment: Immature Platelet Fraction may be clinically indicated, consider ordering this additional test [id number] CONSISTENT WITH PREVIOUS RESULT    nRBC 0.0 0.0 - 0.2 %    Comment: Performed at [facility], [place] [address] [place], [place] [address]  Magnesium     Status: Abnormal   Collection Time: 01/27/19  4:10 AM  Result Value Ref Range   Magnesium 1.5 (L) 1.7 - 2.4 mg/dL    Comment: Performed at [facility] [address], [place], [place] [address]  DIC (disseminated intravasc coag) panel      Status: Abnormal   Collection Time: 01/27/19  4:10 AM  Result Value Ref Range   Prothrombin Time 14.1 11.4 - 15.2 seconds   INR 1.10    aPTT 68 (H) 24 - 36 seconds    Comment:        IF BASELINE aPTT IS ELEVATED, SUGGEST PATIENT RISK ASSESSMENT BE USED TO DETERMINE APPROPRIATE ANTICOAGULANT THERAPY.    Fibrinogen 635 (H) 210 - 475 mg/dL   D-Dimer, Quant 2.77 (H) 0.00 - 0.50 ug/mL-FEU    Comment: (NOTE) At the manufacturer cut-off of 0.50 ug/mL FEU, this assay has been documented to exclude PE with a sensitivity and negative predictive value of 97 to 99%.  At this time, this assay has not been approved by the FDA to exclude DVT/VTE. Results should be correlated with clinical presentation.    Platelets 79 (L) 150 - 400 K/uL    Comment: Immature Platelet Fraction may be clinically indicated, consider ordering this additional test [id number] CONSISTENT WITH  PREVIOUS RESULT    Smear Review NO SCHISTOCYTES SEEN     Comment: Performed at [facility], [place] [address] [place], [place] [address]  Glucose, capillary     Status: None   Collection Time: 01/27/19  4:17 AM  Result Value Ref Range   Glucose-Capillary 92 70 - 99 mg/dL  Glucose, capillary     Status: Abnormal   Collection Time: 01/27/19  8:08 AM  Result Value Ref Range   Glucose-Capillary 49 (L) 70 - 99 mg/dL  Glucose, capillary     Status: None   Collection Time: 01/27/19  8:16 AM  Result Value Ref Range   Glucose-Capillary 92 70 - 99 mg/dL    ECG   N/A  Telemetry   Sinus with periods of atrial bigeminy - Personally Reviewed  Radiology    Dg Chest Port 1 View  Result Date: 01/27/2019 CLINICAL DATA:  Respiratory failure. EXAM: PORTABLE CHEST 1 VIEW COMPARISON:  01/24/2019 and 01/11/2019 FINDINGS: The endotracheal tube is in good position at the level of the thoracic inlet. Feeding tube tip is below the diaphragm. Heart size and vascularity are normal. There is slight atelectasis at the lung bases,  increased on the right. No consolidative infiltrates or definitive effusions. No acute bone abnormality. IMPRESSION: Minimal bibasilar atelectasis, slightly increased on the right. Electronically Signed   By: Lorriane Shire M.D.   On: 01/27/2019 08:14   Dg Abd Portable 1v  Result Date: 01/25/2019 CLINICAL DATA:  Feeding tube placement. EXAM: PORTABLE ABDOMEN - 1 VIEW COMPARISON:  One-view abdomen 01/02/2019 FINDINGS: The tip of a small bore feeding tube is in the distal stomach, potentially the duodenal bulb. Right femoral line is stable. Dilated loops of small bowel in the left upper quadrant are again noted. Gallstones are present. IMPRESSION: 1. The tip of a small bore feeding tube is in the distal stomach, potentially the duodenum bulb. 2. Persistent dilated loops of small bowel in the left upper quadrant. Electronically Signed   By: [place] Morelle M.D.   On: 01/25/2019 11:03    Cardiac Studies   N/A  Assessment   1. Active Problems: 2.   Community acquired pneumonia 3.   Shock ([place]) 4.   Protein-calorie malnutrition, severe 5.   Acute encephalopathy 6.   Plan   1. Remains in shock on pressors - agree with attempt to wean as tolerated. Continue aggressive electrolyte repletion. Would keep on heparin for now - monitor H/H and platelets and for s/s of bleeding. Suspect troponin rise and fall may be demand ischemia in the setting of shock, however, could be related NSTEMI. Given relatively preserved LV systolic function and minimal non-obstructive CAD by cath in 2016, would lean toward eventual non-invasive ischemia assessment when he is more hemodynamically stable.   Time Spent Directly with Patient:  I have spent a total of 35 minutes with the patient reviewing hospital notes, telemetry, EKGs, labs and examining the patient as well as establishing an assessment and plan that was discussed personally with the patient.  > 50% of time was spent in direct patient care.  Length of  Stay:  LOS: 4 days   Pixie Casino, MD, [facility], [place] Director of the [facility] Lip[facility]  Card[facility]ovascular [facility] Diplomate of the American Board of Clinical Lipidology Attending Cardiologist  Direct Dial: [phone]  Fax: [phone]  Website:  [email or web address] 01/27/2019, 8:53 AM

## 2019-01-27 NOTE — Progress Notes (Signed)
NAME:  Eric Dudley, MRN:  [id number], DOB:  10/11/1951, LOS: 4 ADMISSION DATE:  01/06/2019, CONSULTATION DATE:  01/01/2019 REFERRING MD:  Dr. Leonides Schanz, CHIEF COMPLAINT:  AMS/ shock  Brief History   31 yoM with recent URI symptoms, not eating well, drinking well with 3 day hx of dizziness, syncopal episode, and ?vomiting.  Brief arrest vs syncopal episode with EMS with < 60 sec CPR.  In ER, remained hypotensive and agitated, requiring intubation for airway protection.  Required pressors for hypotension despite IVF, found to have AKI, hypokalemia, and LLL PNA.  Started empiric abx.  PCCM to admit.  History of present illness   HPI obtained from medical chart and by conversation with wife as patient is sedated and intubated on mechanical ventilation.   68 year old male with history of nonsmoker, Multiple myeloma (dx 2016, in remission on Revlimid), HTN, HLD, previous NICM with improved EF on recent TTE to 65%, CAD, hx tonsillar cancer.  Wife reports patient has had a cold for the last two weeks after catching it from her.  He recently went to doctor and placed on antibiotics as his symptoms and cough did not improve. States he has not ate well since but has been drinking well.  3 days ago, patient developed dizziness and had syncopal episode at home.  He continued to feel back and fell again last night. He starting complaining of leg and back pain and said he had been vomiting, although wife has not witnessed any vomiting.   Wife called EMS and had brief episode of apnea and pulseless and received CPR for < 60 secs prior to patient becoming conscious during transport.  In ER, patient remained confused and agitated.  He was intubated for airway protection.  He was profoundly hypotensive, despite 4L IVF requiring vasopressors.  He was mildly hypothermic, not hypoxic, and bradycardic with several pauses into the 30's while in ER.  CT head and cervical neg, CT abd performed showing left lower lobe pneumoina and  mild hydronephrosis and distended bladder.  Labs showed Lactic 6.9-> 4.6, K 2.2, sCr 2.65, WBC 6.9, mild transaminitis, UA pending.  EKG SB, with initial diffuse STD, no STE and prolonged QTc.  Started on empiric antibiotics and PCCM called for admission.   Past Medical History  Multiple myeloma (dx 2016, in remission on Revlimid), HTN, HLD, non smoker, previous NICM with improved EF on recent TTE to 65%, CAD, hx tonsillar cancer  Significant Hospital Events   1/27 Admit  Consults:   Procedures:  1/27 ETT >> 1/27 R femoral TL CVL >> 1/27 foley >>  Significant Diagnostic Tests:  01/19/2019 CTH/ cervical  >> 1. No acute intracranial abnormality. 2. Diffuse bone marrow disease consistent with multiple myeloma.  01/02/2019 CT abd/ pelvis >> 1. Aspiration or pneumonia in the left more than right lung base. 2. Bilateral hydronephrosis and hydroureter above an over distended bladder. 3. Nephrolithiasis and cholelithiasis. 4. Sigmoid epiploic appendagitis of indeterminate chronicity.  Micro Data:  1/27 BCx 2 >> no growth in 24-hour 1/27 urine cx >> no growth 1/27 Resp cx  >> gram-positive rods on prelim. 1/27 MRSA PCR >> negative  Antimicrobials:  1/27 vanc x 1 1/27 flagyl x 1 1/27 aztreonam x 1 1/27>>Azithro >1/31 1/27 ceftriaxone >>1/31  Interim history/subjective:  Agitation overnight requiring 1 mg of Ativan.  Appears comfortable this morning.  Objective   Blood pressure 128/66, pulse 76, temperature 98.2 F (36.8 C), resp. rate 16, height _0  (1.778 m), weight  65.5 kg, SpO2 100 %.    Vent Mode: PRVC FiO2 (%):  [30 %] 30 % Set Rate:  [16 bmp] 16 bmp Vt Set:  [510 mL] 510 mL PEEP:  [5 cmH20] 5 cmH20 Plateau Pressure:  [12 cmH20-16 cmH20] 16 cmH20   Intake/Output Summary (Last 24 hours) at 01/27/2019 0814 Last data filed at 01/27/2019 0600 Gross per 24 hour  Intake 4451.24 ml  Output 5850 ml  Net -1398.76 ml   Filed Weights   01/25/19 0500 01/26/19 0500 01/27/19  0436  Weight: 65.2 kg 66.7 kg 65.5 kg    Examination: General: Thin critically ill adult male, sedated, on vent. HEENT: AT,  anicteric, ETT , no JVD, abrasion to nasal bridge and right forehead Neuro: sedated, open eyes and following simple commands. CV: Regular rate and rhythm , no murmur.   PULM: Clear bilaterally with few basal crackles. GI: soft, non-tender, bs active, foley w/cyu Extremities: No lower extremity edema, pulses intact. Skin: no rashes, bilateral arms with bruising and wrapped in ACE bandages  Resolved Hospital Problem list    Assessment & Plan:  Shock- presumed septic, vs hypovolemia from dehydration vs cardiogenic  Tach acidosis resolved initially but patient continued to have non-anion gap metabolic acidosis, continue to require higher doses of pressors. Tube feeding was started yesterday via [place].  On high doses of fentanyl. Repeat echo- with normal EF and moderately reduced RV function  And elevated PA. Trial of Precedex resulted in bradycardia yesterday. P:  Goal MAP >65 Continue levophed, and vasopressin. Continue D5 with 3 ampules of bicarb. We will try Seroquel for agitation. LR bolus 500 ml EKG  Tomorrow for QTc Restart trickle feed.  Acute respiratory failure LLL infiltrate (immunosuppressed) P:  Full MV support-failed another SBT as patient becomes apneic. Wean FiO2 for sats > 92-99% VAP measures pepcid for SUP Continue with azithro and ceftriaxone for 5 days total, will finish today.  Brief Cardiac arrest vs syncope episode with <60 sec CPR 1/27 - wife reports syncope episode x 2 at home with dizziness  Bradycardia- with pauses into the 30's Prolonged QTc- 0.559 Elevated Troponin-started trending up again,last 2.16 Heparin GTT was started last night-platelet trending down. - per cards notes, previously on midodrine for orthostatic hypotension since off P:  Cardiology consult-appreciate their recommendations, not a candidate for  intervention, recommending medical management at this time. Tele monitor for ongoing arrhthymias/ ectopy/ pauses Goal K >2, mag >2 Continue heparin GTT-can be discontinued tomorrow morning after 48 hours. Continue trending troponin. Wife wishes for no more CPR   Electrolyte abnormalities.  Persistent hypokalemia, hypophosphatemia, hypomagnesemia.  Hypernatremia resolved. Might be some element of refeeding syndrome. -Replete potassium, phosphorus and magnesium. -Monitor BMP, phosphorus and magnesium.  Acute encephalopathy likely related to hypotension -CTH/ cervical neg P:  Monitor Ongoing neuro checks PAD protocol with fentanyl and Precedex w/ bowel regimen Daily WUA RASS goal 0/-1  AKI on CKD2 Bilateral mild hydronephrosis with distended bladder- s/p foley with ~1.2 L output. Renal ultrasound with no hydronephrosis. Creatinine continue to improve, at 1.00 today with baseline of 1.2. P:  Continue foley given critical illness Strict I/O's, daily wts, UOP trend Repeat renal function in am. Check urinary electrolytes.  Multiple myeloma - in remission P:  Hold revlimid   Hx HTN, HLD P:  Hold zocor, losartan Continue daily ASA 5m  Hyperglycemia P:  CBG a 4, add SSI if > 180  Mild Transaminitis: Resolved - CT abd without acute process P:  Continue to monitor.  Best practice:  Diet: NPO Pain/Anxiety/Delirium protocol (if indicated): fentanyl /Precedex VAP protocol (if indicated): yes DVT prophylaxis: SCDs/ heparin GI prophylaxis: pepcid Glucose control: CBG q 4 Mobility: BR Code Status: After lengthly discussion with wife, she states patient has recently told her he was so tired of feeling bad and didn't want to do this anymore.  She states he would not want this but is agreeable to continue short term support for now.  She states no CPR if despite all current aggressive measures he were to decompensate to this.  Orders change to reflect this  Family  Communication:  Disposition: admit ICU  Labs   CBC: Recent Labs  Lab 01/20/2019 0255  01/24/19 0420 01/24/19 1145 01/25/19 0425 01/25/19 1552 01/26/19 0341 01/27/19 0410  WBC 6.9  --  7.1  --  6.4  --  5.8 5.8  NEUTROABS 5.0  --   --   --   --   --   --   --   HGB 15.5   < > 14.9 12.9* 13.6 11.6* 12.0* 11.9*  HCT 44.6   < > 44.6 38.0* 39.6 34.0* 36.5* 35.2*  MCV 95.7  --  100.9*  --  98.8  --  101.1* 101.1*  PLT 191  --  154  --  95*  --  71* 79*  80*   < > = values in this interval not displayed.    Basic Metabolic Panel: Recent Labs  Lab 01/25/19 0425 01/25/19 1115 01/25/19 1552 01/26/19 0341 01/26/19 1220 01/26/19 1547 01/27/19 0410  NA 144 141 142 142 141  --  145  K 2.9* 3.1* 3.0* 3.1* 3.9  --  3.0*  CL 120* 116*  --  119* 116*  --  117*  CO2 18* 19*  --  17* 18*  --  21*  GLUCOSE 124* 87  --  118* 100*  --  113*  BUN 6* 7*  --  9 8  --  6*  CREATININE 1.31* 1.16  --  1.09 0.98  --  1.00  CALCIUM 6.7* 6.8*  --  7.1* 6.9*  --  7.8*  MG 1.5*  --   --  1.3* 2.0 1.8 1.5*  PHOS  --   --   --  <1.0* 2.0* 2.1* 2.4*   GFR: Estimated Creatinine Clearance: 64.6 mL/min (by C-G formula based on SCr of 1 mg/dL). Recent Labs  Lab 01/20/2019 0255 01/07/2019 0500 01/22/2019 0736 01/24/19 0420 01/25/19 0425 01/26/19 0341 01/27/19 0410  WBC 6.9  --   --  7.1 6.4 5.8 5.8  LATICACIDVEN 6.9* 4.6* 1.7  --   --   --   --     Liver Function Tests: Recent Labs  Lab 01/19/2019 0255 01/24/19 0745 01/25/19 0425 01/26/19 0341 01/26/19 1220 01/27/19 0410  AST 43* 30 40  --   --   --   ALT 46* 35 32  --   --   --   ALKPHOS 122 88 96  --   --   --   BILITOT 1.7* 0.9 0.5  --   --   --   PROT 5.7* 4.7* 4.5*  --   --   --   ALBUMIN 3.0* 2.1* 1.9* 1.8* 1.9* 2.0*   Recent Labs  Lab 01/22/2019 0255  LIPASE 32   Recent Labs  Lab 12/28/2018 0302  AMMONIA 25    ABG    Component Value Date/Time   PHART 7.312 (L) 01/25/2019 1552  PCO2ART 36.0 01/25/2019 1552   PO2ART 118.0  (H) 01/25/2019 1552   HCO3 18.3 (L) 01/25/2019 1552   TCO2 19 (L) 01/25/2019 1552   ACIDBASEDEF 7.0 (H) 01/25/2019 1552   O2SAT 98.0 01/25/2019 1552     Coagulation Profile: Recent Labs  Lab 01/27/19 0410  INR 1.10    Cardiac Enzymes: Recent Labs  Lab 01/26/19 0014 01/26/19 0619 01/26/19 1220 01/26/19 1812 01/27/19 0115  TROPONINI 1.59* 2.16* 2.24* 1.76* 1.40*    HbA1C: No results found for: HGBA1C  CBG: Recent Labs  Lab 01/26/19 1539 01/26/19 2013 01/27/19 0037 01/27/19 0417 01/27/19 0808  GLUCAP 121* 99 94 92 49*    Review of Systems:   Unable to obtain  Past Medical History  He,  has a past medical history of Arthritis, Cholelithiases, Chronic kidney disease, Hyperlipidemia, Hypertension, Multiple myeloma ([place]), and Tonsillar cancer ([place]) (2006).   Surgical History    Past Surgical History:  Procedure Laterality Date  . APPENDECTOMY  1962  . CARDIAC CATHETERIZATION N/A 04/30/2015   Procedure: Left Heart Cath and Coronary Angiography;  Surgeon: Peter M Martinique, MD;  Location: [facility] INVASIVE CV LAB CUPID;  Service: Cardiovascular;  Laterality: N/A;  . CARDIOLITE MYOCARDIAL PERFUSION STUDY  04/16/03   NEGITIVE BRUCE PROTOCAL EXERCISE STRESS TEST. EF 70%. NO ISCHEMIA.  [name] KNEE SURGERY  2012   right  . [place]   left     Social History   reports that he quit smoking about 15 years ago. His smoking use included cigars. He quit smokeless tobacco use about 24 years ago.  His smokeless tobacco use included chew. He reports that he does not drink alcohol or use drugs.   Family History   His family history includes Cancer in his father and sister; Cancer (age of onset: 71) in his mother; Hypertension in his sister.   Allergies Allergies  Allergen Reactions  . Hydrochlorothiazide Rash and Palpitations  . Asa [Aspirin] Nausea Only  . Calcium Carbonate Nausea And Vomiting  . Oxycodone Other (See Comments)    Patient does not want to take. Severe  Hallucinations.  . Amoxicillin Itching and Rash    Did it involve swelling of the face/tongue/throat, SOB, or low BP? Unknown Did it involve sudden or severe rash/hives, skin peeling, or any reaction on the inside of your mouth or nose? Unknown Did you need to seek medical attention at a hospital or doctor's office? Unknown When did it last happen? Unk If all above answers are "NO", may proceed with cephalosporin use.   . Indapamide Palpitations    Lightheadedness, dizziness     Home Medications  Prior to Admission medications   Medication Sig Start Date End Date Taking? Authorizing Provider  acetaminophen-codeine (TYLENOL #3) 300-30 MG tablet Take 1 tablet by mouth every 8 (eight) hours as needed for severe pain. 10/13/18   Truitt Merle, MD  aspirin 81 MG chewable tablet Chew 1 tablet (81 mg total) by mouth daily. 05/01/15   Caren Griffins, MD  calcium carbonate (OS-CAL) 600 MG TABS tablet Take 1,200 mg by mouth at bedtime.     [provider]  Cholecalciferol (VITAMIN D3) 1000 units CAPS Take 2,000 mg by mouth daily.    [provider]  losartan (COZAAR) 25 MG tablet Take 1 tablet (25 mg total) by mouth daily. 01/12/19 04/12/19  Hilty, Nadean Corwin, MD  Multiple Vitamins-Minerals (CENTRUM SILVER ULTRA MENS PO) Take 1 tablet by mouth at bedtime.     [provider]  naftifine (NAFTIN) 1 % cream Apply 1 application topically daily as needed.    [provider]  nitroGLYCERIN (NITROSTAT) 0.4 MG SL tablet Place 0.4 mg under the tongue every 5 (five) minutes x 3 doses as needed for chest pain. Reported on 05/19/2016    [provider]  ondansetron (ZOFRAN) 4 MG tablet TAKE 1 TABLET FOUR TIMES A DAY AS NEEDED FOR NAUSEA OR VOMITING 12/08/18   Truitt Merle, MD  potassium chloride (MICRO-K) 10 MEQ CR capsule TAKE 3 CAPSULES DAILY 11/08/18   Truitt Merle, MD  ranitidine (ZANTAC) 150 MG tablet Take 2 tablets (300 mg total) by mouth at bedtime. 05/24/15   Truitt Merle, MD  REVLIMID 5 MG capsule Take 1 capsule (5 mg total) by mouth as directed. Take 5 mg daily for 21 days, rest 7 days. 12/30/18   Truitt Merle, MD  simvastatin (ZOCOR) 20 MG tablet Take 20 mg by mouth daily.    [provider]  vitamin B-12 (CYANOCOBALAMIN) 500 MCG tablet Take 1 tablet by mouth daily.    [provider]  zolpidem (AMBIEN) 5 MG tablet Take 1 tablet (5 mg total) by mouth at bedtime as needed for sleep. 04/26/17   Theodis Blaze, MD         Lorella Nimrod MD PGY3 Pager [phone] 01/27/2019, 8:14 AM

## 2019-01-27 NOTE — Consult Note (Signed)
[place] Nurse wound consult note Reason for Consult: Consult requested for upper lip.  Pt is critically ill with multiple systemic factors which can impair healing. He is on the vent and ETT has caused a deep tissue pressure injury; middle upper lip .5X.5cm, dark reddish purple intact skin Pressure Injury POA: No Dressing procedure/placement/frequency: Keep ETT away from location to reduce pressure to the affected area.  Vaseline to promote moist healing.  No family present to discuss plan of care. [place] team will re-assess location weekly. Julien Girt MSN, RN, [place], [place], [place]

## 2019-01-28 DIAGNOSIS — E878 Other disorders of electrolyte and fluid balance, not elsewhere classified: Secondary | ICD-10-CM

## 2019-01-28 LAB — RENAL FUNCTION PANEL
Albumin: 2.1 g/dL — ABNORMAL LOW (ref 3.5–5.0)
Anion gap: 9 (ref 5–15)
BUN: 5 mg/dL — ABNORMAL LOW (ref 8–23)
CHLORIDE: 122 mmol/L — AB (ref 98–111)
CO2: 19 mmol/L — ABNORMAL LOW (ref 22–32)
Calcium: 8.7 mg/dL — ABNORMAL LOW (ref 8.9–10.3)
Creatinine, Ser: 1.17 mg/dL (ref 0.61–1.24)
GFR calc Af Amer: >60 mL/min (ref >60)
GFR calc non Af Amer: >60 mL/min (ref >60)
Glucose, Bld: 105 mg/dL — ABNORMAL HIGH (ref 70–99)
Phosphorus: 1.8 mg/dL — ABNORMAL LOW (ref 2.5–4.6)
Potassium: 3.4 mmol/L — ABNORMAL LOW (ref 3.5–5.1)
Sodium: 150 mmol/L — ABNORMAL HIGH (ref 135–145)

## 2019-01-28 LAB — CBC
HCT: 37 % — ABNORMAL LOW (ref 39.0–52.0)
Hemoglobin: 12 g/dL — ABNORMAL LOW (ref 13.0–17.0)
MCH: 33 pg (ref 26.0–34.0)
MCHC: 32.4 g/dL (ref 30.0–36.0)
MCV: 101.6 fL — ABNORMAL HIGH (ref 80.0–100.0)
Platelets: 90 10*3/uL — ABNORMAL LOW (ref 150–400)
RBC: 3.64 MIL/uL — ABNORMAL LOW (ref 4.22–5.81)
RDW: 15 % (ref 11.5–15.5)
WBC: 4.4 10*3/uL (ref 4.0–10.5)
nRBC: 0 % (ref 0.0–0.2)

## 2019-01-28 LAB — CULTURE, BLOOD (ROUTINE X 2)
Culture: NO GROWTH
Culture: NO GROWTH
Special Requests: ADEQUATE

## 2019-01-28 LAB — HEMOGLOBIN A1C
Hgb A1c MFr Bld: 5.4 % (ref 4.8–5.6)
Mean Plasma Glucose: 108.28 mg/dL

## 2019-01-28 LAB — HEPARIN LEVEL (UNFRACTIONATED)
Heparin Unfractionated: 0.28 IU/mL — ABNORMAL LOW (ref 0.30–0.70)
Heparin Unfractionated: 0.38 IU/mL (ref 0.30–0.70)

## 2019-01-28 LAB — GLUCOSE, CAPILLARY
Glucose-Capillary: 112 mg/dL — ABNORMAL HIGH (ref 70–99)
Glucose-Capillary: 84 mg/dL (ref 70–99)
Glucose-Capillary: 82 mg/dL (ref 70–99)
Glucose-Capillary: 85 mg/dL (ref 70–99)
Glucose-Capillary: 91 mg/dL (ref 70–99)
Glucose-Capillary: 66 mg/dL — ABNORMAL LOW (ref 70–99)
Glucose-Capillary: 75 mg/dL (ref 70–99)

## 2019-01-28 LAB — MAGNESIUM: MAGNESIUM: 1.8 mg/dL (ref 1.7–2.4)

## 2019-01-28 MED ORDER — FREE WATER
200.0000 mL | Status: DC
  Administered 2019-01-28 – 2019-01-29 (×5): 200 mL

## 2019-01-28 MED ORDER — POTASSIUM CHLORIDE 20 MEQ/15ML (10%) PO SOLN
20.0000 meq | ORAL | Status: DC
  Filled 2019-01-28: qty 15

## 2019-01-28 MED ORDER — MAGNESIUM SULFATE 2 GM/50ML IV SOLN
2.0000 g | Freq: Once | INTRAVENOUS | Status: DC
  Administered 2019-01-28: 2 g via INTRAVENOUS
  Filled 2019-01-28: qty 50

## 2019-01-28 MED ORDER — MAGNESIUM SULFATE 2 GM/50ML IV SOLN: 2.0000 g | Freq: Once | INTRAVENOUS | Status: DC

## 2019-01-28 MED ORDER — SODIUM PHOSPHATES 45 MMOLE/15ML IV SOLN: 20.0000 mmol | Freq: Once | INTRAVENOUS | Status: DC

## 2019-01-28 MED ORDER — POTASSIUM CHLORIDE 20 MEQ/15ML (10%) PO SOLN: 40.0000 meq | Freq: Once | ORAL | Status: DC

## 2019-01-28 MED ORDER — POTASSIUM PHOSPHATES 15 MMOLE/5ML IV SOLN
30.0000 mmol | Freq: Once | INTRAVENOUS | Status: AC
  Administered 2019-01-28: 30 mmol via INTRAVENOUS
  Filled 2019-01-28: qty 10

## 2019-01-28 MED ORDER — FREE WATER: 200.0000 mL | Freq: Three times a day (TID) | Status: DC

## 2019-01-28 MED ORDER — INSULIN ASPART 100 UNIT/ML ~~LOC~~ SOLN
0.0000 [IU] | SUBCUTANEOUS | Status: DC
  Administered 2019-01-29 – 2019-02-01 (×7): 2 [IU] via SUBCUTANEOUS

## 2019-01-28 MED ORDER — MAGNESIUM SULFATE 4 GM/100ML IV SOLN: 4.0000 g | Freq: Once | INTRAVENOUS | Status: DC

## 2019-01-28 NOTE — Progress Notes (Signed)
ANTICOAGULATION CONSULT NOTE   Pharmacy Consult for Heparin  Indication: chest pain/ACS  Patient Measurements: Height: 5\' 10"  (177.8 cm) Weight: 140 lb 3.4 oz (63.6 kg) IBW/kg (Calculated) : 73  Vital Signs: Temp: 97.3 F (36.3 C) (02/01 1630) Temp Source: Core (02/01 1627) BP: 102/48 (02/01 1639) Pulse Rate: 63 (02/01 1639)  Labs: Recent Labs    01/26/19 0341  01/26/19 1220 01/26/19 1301 01/26/19 1812 01/27/19 0115 01/27/19 0410 01/27/19 1349 01/27/19 2032 01/28/19 0401 01/28/19 0515 01/28/19 1642  HGB 12.0*  --   --   --   --   --  11.9*  --   --  12.0*  --   --   HCT 36.5*  --   --   --   --   --  35.2*  --   --  37.0*  --   --   PLT 71*  --   --   --   --   --  79*  80*  --   --  90*  --   --   APTT  --   --   --   --   --   --  68*  --   --   --   --   --   LABPROT  --   --   --   --   --   --  14.1  --   --   --   --   --   INR  --   --   --   --   --   --  1.10  --   --   --   --   --   HEPARINUNFRC  --    < >  --  0.34  --   --   --   --   --   --  0.28* 0.38  CREATININE 1.09  --  0.98  --   --   --  1.00 1.01 1.16 1.17  --   --   TROPONINI  --    < > 2.24*  --  1.76* 1.40*  --   --   --   --   --   --    < > = values in this interval not displayed.    Estimated Creatinine Clearance: 53.6 mL/min (by C-G formula based on SCr of 1.17 mg/dL).  Assessment: 68 y/o M with increasing troponin (0.64>>0.85>>1.26) to start heparin per pharmacy. CBC stable.  Heparin level within goal  Goal of Therapy:  Heparin level 0.3-0.7 units/ml Monitor platelets by anticoagulation protocol: Yes   Plan:  heparin infusion to 950 units/hr Daily hep lvl cbc  Levester Fresh, PharmD, BCPS, BCCCP Clinical Pharmacist [phone]  Please check AMION for all [place] numbers  01/28/2019 5:08 PM

## 2019-01-28 NOTE — Progress Notes (Signed)
NAME:  Eric Dudley, MRN:  [id number], DOB:  1951/11/27, LOS: 5 ADMISSION DATE:  01/13/2019, CONSULTATION DATE:  01/09/2019 REFERRING MD:  Dr. Leonides Schanz, CHIEF COMPLAINT:  AMS/ shock  Brief History   68 yoM with recent URI symptoms, not eating well, drinking well with 3 day hx of dizziness, syncopal episode, and ?vomiting.  Brief arrest vs syncopal episode with EMS with < 60 sec CPR.  In ER, remained hypotensive and agitated, requiring intubation for airway protection.  Required pressors for hypotension despite IVF, found to have AKI, hypokalemia, and LLL PNA.  Started empiric abx.  PCCM to admit.  History of present illness   HPI obtained from medical chart and by conversation with wife as patient is sedated and intubated on mechanical ventilation.   68 year old male with history of nonsmoker, Multiple myeloma (dx 2016, in remission on Revlimid), HTN, HLD, previous NICM with improved EF on recent TTE to 65%, CAD, hx tonsillar cancer.  Wife reports patient has had a cold for the last two weeks after catching it from her.  He recently went to doctor and placed on antibiotics as his symptoms and cough did not improve. States he has not ate well since but has been drinking well.  3 days ago, patient developed dizziness and had syncopal episode at home.  He continued to feel back and fell again last night. He starting complaining of leg and back pain and said he had been vomiting, although wife has not witnessed any vomiting.   Wife called EMS and had brief episode of apnea and pulseless and received CPR for < 60 secs prior to patient becoming conscious during transport.  In ER, patient remained confused and agitated.  He was intubated for airway protection.  He was profoundly hypotensive, despite 4L IVF requiring vasopressors.  He was mildly hypothermic, not hypoxic, and bradycardic with several pauses into the 30's while in ER.  CT head and cervical neg, CT abd performed showing left lower lobe pneumoina and  mild hydronephrosis and distended bladder.  Labs showed Lactic 6.9-> 4.6, K 2.2, sCr 2.65, WBC 6.9, mild transaminitis, UA pending.  EKG SB, with initial diffuse STD, no STE and prolonged QTc.  Started on empiric antibiotics and PCCM called for admission.   Past Medical History  Multiple myeloma (dx 2016, in remission on Revlimid), HTN, HLD, non smoker, previous NICM with improved EF on recent TTE to 65%, CAD, hx tonsillar cancer  Significant Hospital Events   1/27 Admit  Consults:   Procedures:  1/27 ETT >> 1/27 R femoral TL CVL >> 1/27 foley >>  Significant Diagnostic Tests:  01/18/2019 CTH/ cervical  >> 1. No acute intracranial abnormality. 2. Diffuse bone marrow disease consistent with multiple myeloma.  01/02/2019 CT abd/ pelvis >> 1. Aspiration or pneumonia in the left more than right lung base. 2. Bilateral hydronephrosis and hydroureter above an over distended bladder. 3. Nephrolithiasis and cholelithiasis. 4. Sigmoid epiploic appendagitis of indeterminate chronicity.  Micro Data:  1/27 BCx 2 >> no growth in 24-hour 1/27 urine cx >> no growth 1/27 Resp cx  >> gram-positive rods on prelim. 1/27 MRSA PCR >> negative  Antimicrobials:  1/27 vanc x 1 1/27 flagyl x 1 1/27 aztreonam x 1 1/27>>Azithro >1/31 1/27 ceftriaxone >>1/31  Interim history/subjective:  Weaned off pressor support and weaning sedation. Follows commands with nursing staff  Objective   Blood pressure (!) 98/53, pulse 93, temperature (!) 97.5 F (36.4 C), resp. rate 16, height _0  (1.778  m), weight 63.6 kg, SpO2 100 %.    Vent Mode: PRVC FiO2 (%):  [30 %] 30 % Set Rate:  [16 bmp] 16 bmp Vt Set:  [510 mL] 510 mL PEEP:  [5 cmH20] 5 cmH20 Plateau Pressure:  [13 cmH20-16 cmH20] 13 cmH20   Intake/Output Summary (Last 24 hours) at 01/28/2019 2215 Last data filed at 01/28/2019 1830 Gross per 24 hour  Intake 2113.34 ml  Output 4208 ml  Net -2094.66 ml   Filed Weights   01/26/19 0500 01/27/19 0436  01/28/19 0440  Weight: 66.7 kg 65.5 kg 63.6 kg   Physical Exam: General: Critically ill appearing male, no acute distress HENT: [place], AT, ETT in place Eyes: EOMI, no scleral icterus Respiratory: Clear to auscultation bilaterally.  No crackles, wheezing or rales Cardiovascular: Distant, RRR, -M/R/G, no JVD GI: BS+, soft, nontender Extremities:-Edema,-tenderness, warm to touch Neuro: Sedated, intermittently follows commands Skin: Intact, no rashes or bruising GU: Foley in place   Resolved Hospital Problem list   Septic Shock secondary to pneumonia AKI  Assessment & Plan:   Acute respiratory failure secondary to LLL pneumonia Chronic immunosuppression Shock has resolved with current antibiotic regimen. Respiratory culture with few candida tropicalis which likely represents contaminant. With improved clinical status, would not recommend additional infectious work-up this time including deferring bronchoscopy. P:  - Full vent support. SBT tomorrow if mental status continues to improve - Complete 5d course for CAP - VAP  Acute encephalopathy likely related to hypotension -CTH/ cervical neg P:  - Wean sedation with goal RASS 0 to -1  S/p PEA Arrest  Elevated Troponin thought secondary demand ischemia Prior cath 2016 with minimal CAD P: - Telemetry - ASA and Plavix - Heparin gtt  - Cardiology following: Appreciate recs. Continue medical mgmt with no plans for cath  Severe electrolyte abnormalities   Hypokalemia, hypophosphatemia, hypomagnesemia and hypernatremia  - Replete as needed - Free water increased  Multiple myeloma - in remission P:  - Oncology following: Appreciate recs. Hold revlimid   Hx HTN, HLD P:  Hold zocor, losartan Continue daily ASA 5m    Best practice:  Diet: TF Pain/Anxiety/Delirium protocol (if indicated): fentanyl  VAP protocol (if indicated): yes DVT prophylaxis: SCDs/ heparin GI prophylaxis: pepcid Glucose control: CBG q 4 Mobility:  BR Code Status: DNR Family Communication: No family at bedside Disposition: Remain in ICU  Labs   CBC: Recent Labs  Lab 01/21/2019 0255  01/24/19 0420  01/25/19 0425 01/25/19 1552 01/26/19 0341 01/27/19 0410 01/28/19 0401  WBC 6.9  --  7.1  --  6.4  --  5.8 5.8 4.4  NEUTROABS 5.0  --   --   --   --   --   --   --   --   HGB 15.5   < > 14.9   < > 13.6 11.6* 12.0* 11.9* 12.0*  HCT 44.6   < > 44.6   < > 39.6 34.0* 36.5* 35.2* 37.0*  MCV 95.7  --  100.9*  --  98.8  --  101.1* 101.1* 101.6*  PLT 191  --  154  --  95*  --  71* 79*  80* 90*   < > = values in this interval not displayed.    Basic Metabolic Panel: Recent Labs  Lab 01/26/19 1220 01/26/19 1547 01/27/19 0410 01/27/19 1349 01/27/19 2032 01/28/19 0401  NA 141  --  145 145 150* 150*  K 3.9  --  3.0* 3.3* 3.6 3.4*  CL 116*  --  117* 116* 122* 122*  CO2 18*  --  21* 22 21* 19*  GLUCOSE 100*  --  113* 118* 106* 105*  BUN 8  --  6* 6* 7* 5*  CREATININE 0.98  --  1.00 1.01 1.16 1.17  CALCIUM 6.9*  --  7.8* 7.7* 8.5* 8.7*  MG 2.0 1.8 1.5* 2.1 2.1 1.8  PHOS 2.0* 2.1* 2.4*  --  2.3* 1.8*   GFR: Estimated Creatinine Clearance: 53.6 mL/min (by C-G formula based on SCr of 1.17 mg/dL). Recent Labs  Lab 01/13/2019 0255 01/14/2019 0500 01/22/2019 0736  01/25/19 0425 01/26/19 0341 01/27/19 0410 01/28/19 0401  WBC 6.9  --   --    < > 6.4 5.8 5.8 4.4  LATICACIDVEN 6.9* 4.6* 1.7  --   --   --   --   --    < > = values in this interval not displayed.    Liver Function Tests: Recent Labs  Lab 01/27/2019 0255 01/24/19 0745 01/25/19 0425 01/26/19 0341 01/26/19 1220 01/27/19 0410 01/27/19 2032 01/28/19 0401  AST 43* 30 40  --   --   --   --   --   ALT 46* 35 32  --   --   --   --   --   ALKPHOS 122 88 96  --   --   --   --   --   BILITOT 1.7* 0.9 0.5  --   --   --   --   --   PROT 5.7* 4.7* 4.5*  --   --   --   --   --   ALBUMIN 3.0* 2.1* 1.9* 1.8* 1.9* 2.0* 2.2* 2.1*   Recent Labs  Lab 01/14/2019 0255  LIPASE 32    Recent Labs  Lab 01/18/2019 0302  AMMONIA 25    ABG    Component Value Date/Time   PHART 7.312 (L) 01/25/2019 1552   PCO2ART 36.0 01/25/2019 1552   PO2ART 118.0 (H) 01/25/2019 1552   HCO3 18.3 (L) 01/25/2019 1552   TCO2 19 (L) 01/25/2019 1552   ACIDBASEDEF 7.0 (H) 01/25/2019 1552   O2SAT 98.0 01/25/2019 1552     Coagulation Profile: Recent Labs  Lab 01/27/19 0410  INR 1.10    Cardiac Enzymes: Recent Labs  Lab 01/26/19 0014 01/26/19 0619 01/26/19 1220 01/26/19 1812 01/27/19 0115  TROPONINI 1.59* 2.16* 2.24* 1.76* 1.40*    HbA1C: Hgb A1c MFr Bld  Date/Time Value Ref Range Status  01/28/2019 07:01 AM 5.4 4.8 - 5.6 % Final    Comment:    (NOTE) Pre diabetes:          5.7%-6.4% Diabetes:              >6.4% Glycemic control for   <7.0% adults with diabetes     CBG: Recent Labs  Lab 01/28/19 0742 01/28/19 1150 01/28/19 1629 01/28/19 2038 01/28/19 2042  GLUCAP 84 82 91 66* 75     The patient is critically ill with multiple organ systems failure and requires high complexity decision making for assessment and support, frequent evaluation and titration of therapies, application of advanced monitoring technologies and extensive interpretation of multiple databases.   Critical Care Time devoted to patient care services described in this note is 35 Minutes. This time reflects time of care of this signee Dr. Rodman Pickle. This critical care time does not reflect procedure time, or teaching time or supervisory time of PA/NP/Med student/Med Resident etc but could involve  care discussion time.  Rodman Pickle, M.D. [facility] Pulmonary/Critical Care Medicine Pager: [phone] After hours pager: [phone]

## 2019-01-28 NOTE — Progress Notes (Signed)
ANTICOAGULATION CONSULT NOTE   Pharmacy Consult for Heparin  Indication: chest pain/ACS  Patient Measurements: Height: '5\' 10"'  (177.8 cm) Weight: 140 lb 3.4 oz (63.6 kg) IBW/kg (Calculated) : 73  Vital Signs: Temp: 98.1 F (36.7 C) (02/01 0738) Temp Source: Core (02/01 0738) BP: 153/81 (02/01 0720) Pulse Rate: 104 (02/01 0720)  Labs: Recent Labs    01/26/19 0341 01/26/19 5366 01/26/19 1220 01/26/19 1301 01/26/19 1812 01/27/19 0115 01/27/19 0410 01/27/19 1349 01/27/19 2032 01/28/19 0401 01/28/19 0515  HGB 12.0*  --   --   --   --   --  11.9*  --   --  12.0*  --   HCT 36.5*  --   --   --   --   --  35.2*  --   --  37.0*  --   PLT 71*  --   --   --   --   --  79*  80*  --   --  90*  --   APTT  --   --   --   --   --   --  68*  --   --   --   --   LABPROT  --   --   --   --   --   --  14.1  --   --   --   --   INR  --   --   --   --   --   --  1.10  --   --   --   --   HEPARINUNFRC  --  0.35  --  0.34  --   --   --   --   --   --  0.28*  CREATININE 1.09  --  0.98  --   --   --  1.00 1.01 1.16 1.17  --   TROPONINI  --  2.16* 2.24*  --  1.76* 1.40*  --   --   --   --   --     Estimated Creatinine Clearance: 53.6 mL/min (by C-G formula based on SCr of 1.17 mg/dL).   Medical History: Past Medical History:  Diagnosis Date  . Arthritis    neck and back pain  . Cholelithiases   . Chronic kidney disease    MILD,CHRONIC  . Hyperlipidemia   . Hypertension   . Multiple myeloma ([place])   . Tonsillar cancer ([place]) 2006   Assessment: 68 y/o M with increasing troponin (0.64>>0.85>>1.26) to start heparin per pharmacy. CBC stable.  Heparin level this morning is slightly SUBtherapeutic at 0.28, no problems with infusion or bleeding per RN.  Goal of Therapy:  Heparin level 0.3-0.7 units/ml Monitor platelets by anticoagulation protocol: Yes   Plan:  Increase heparin infusion to 950 units/hr Check anti-Xa level in 6 hours and daily while on heparin Continue to monitor H&H  and platelets  Thank you for allowing [place] to participate in this patients care.   Jens Som, PharmD Please utilize Amion (under [place]) for appropriate number for your unit pharmacist. 01/28/2019 8:18 AM

## 2019-01-28 NOTE — Progress Notes (Deleted)
[facility] ADULT ICU REPLACEMENT PROTOCOL FOR AM LAB REPLACEMENT ONLY  The patient does apply for the [facility] Adult ICU Electrolyte Replacment Protocol based on the criteria listed below:   1. Is GFR >/= 40 ml/min? Yes.    Patient's GFR today is >60 2. Is urine output >/= 0.5 ml/kg/hr for the last 6 hours? Yes.   Patient's UOP is 4.3 ml/kg/hr 3. Is BUN < 60 mg/dL? Yes.    Patient's BUN today is 5 4. Abnormal electrolyte(s):K3.4,Mg1.8, Phos1.8 5. Ordered repletion with: per protocol 6. If a panic level lab has been reported, has the CCM MD in charge been notified? Yes.  .   Physician:  E Adventura,MD  [facility] 01/28/2019 5:54 AM

## 2019-01-28 DEATH — deceased

## 2019-01-29 DIAGNOSIS — R451 Restlessness and agitation: Secondary | ICD-10-CM

## 2019-01-29 LAB — CBC WITH DIFFERENTIAL/PLATELET
ABS IMMATURE GRANULOCYTES: 0.03 10*3/uL (ref 0.00–0.07)
BASOS ABS: 0 10*3/uL (ref 0.0–0.1)
Basophils Relative: 1 %
Eosinophils Absolute: 0.2 10*3/uL (ref 0.0–0.5)
Eosinophils Relative: 4 %
HCT: 38.1 % — ABNORMAL LOW (ref 39.0–52.0)
Hemoglobin: 12.3 g/dL — ABNORMAL LOW (ref 13.0–17.0)
IMMATURE GRANULOCYTES: 1 %
Lymphocytes Relative: 17 %
Lymphs Abs: 0.7 10*3/uL (ref 0.7–4.0)
MCH: 33.4 pg (ref 26.0–34.0)
MCHC: 32.3 g/dL (ref 30.0–36.0)
MCV: 103.5 fL — ABNORMAL HIGH (ref 80.0–100.0)
Monocytes Absolute: 0.8 10*3/uL (ref 0.1–1.0)
Monocytes Relative: 18 %
NEUTROS PCT: 59 %
Neutro Abs: 2.6 10*3/uL (ref 1.7–7.7)
Platelets: 108 10*3/uL — ABNORMAL LOW (ref 150–400)
RBC: 3.68 MIL/uL — ABNORMAL LOW (ref 4.22–5.81)
RDW: 15.7 % — ABNORMAL HIGH (ref 11.5–15.5)
WBC: 4.3 10*3/uL (ref 4.0–10.5)
nRBC: 0 % (ref 0.0–0.2)

## 2019-01-29 LAB — POCT ACTIVATED CLOTTING TIME
Activated Clotting Time: 0 seconds
Activated Clotting Time: 0 seconds

## 2019-01-29 LAB — BLOOD GAS, ARTERIAL
Acid-base deficit: 4.6 mmol/L — ABNORMAL HIGH (ref 0.0–2.0)
Bicarbonate: 20.7 mmol/L (ref 20.0–28.0)
Drawn by: 365291
FIO2: 30
MECHVT: 510 mL
O2 Saturation: 98.4 %
Patient temperature: 98.6
RATE: 16 resp/min
pCO2 arterial: 43.3 mmHg (ref 32.0–48.0)
pH, Arterial: 7.301 — ABNORMAL LOW (ref 7.350–7.450)
pO2, Arterial: 134 mmHg — ABNORMAL HIGH (ref 83.0–108.0)

## 2019-01-29 LAB — BASIC METABOLIC PANEL
Anion gap: 8 (ref 5–15)
Anion gap: 8 (ref 5–15)
BUN: 10 mg/dL (ref 8–23)
BUN: 12 mg/dL (ref 8–23)
CO2: 22 mmol/L (ref 22–32)
CO2: 20 mmol/L — ABNORMAL LOW (ref 22–32)
Calcium: 8.9 mg/dL (ref 8.9–10.3)
Calcium: 8.8 mg/dL — ABNORMAL LOW (ref 8.9–10.3)
Chloride: 123 mmol/L — ABNORMAL HIGH (ref 98–111)
Chloride: 119 mmol/L — ABNORMAL HIGH (ref 98–111)
Creatinine, Ser: 1.3 mg/dL — ABNORMAL HIGH (ref 0.61–1.24)
Creatinine, Ser: 1.34 mg/dL — ABNORMAL HIGH (ref 0.61–1.24)
GFR calc Af Amer: >60 mL/min (ref >60)
GFR calc non Af Amer: 56 mL/min — ABNORMAL LOW (ref >60)
GFR, EST NON AFRICAN AMERICAN: 54 mL/min — AB (ref >60)
Glucose, Bld: 99 mg/dL (ref 70–99)
Glucose, Bld: 128 mg/dL — ABNORMAL HIGH (ref 70–99)
POTASSIUM: 3.8 mmol/L (ref 3.5–5.1)
Potassium: 3.2 mmol/L — ABNORMAL LOW (ref 3.5–5.1)
Sodium: 153 mmol/L — ABNORMAL HIGH (ref 135–145)
Sodium: 147 mmol/L — ABNORMAL HIGH (ref 135–145)

## 2019-01-29 LAB — GLUCOSE, CAPILLARY
Glucose-Capillary: 102 mg/dL — ABNORMAL HIGH (ref 70–99)
Glucose-Capillary: 123 mg/dL — ABNORMAL HIGH (ref 70–99)
Glucose-Capillary: 60 mg/dL — ABNORMAL LOW (ref 70–99)
Glucose-Capillary: 75 mg/dL (ref 70–99)
Glucose-Capillary: 75 mg/dL (ref 70–99)
Glucose-Capillary: 79 mg/dL (ref 70–99)
Glucose-Capillary: 80 mg/dL (ref 70–99)
Glucose-Capillary: 86 mg/dL (ref 70–99)

## 2019-01-29 LAB — HEPARIN LEVEL (UNFRACTIONATED): Heparin Unfractionated: 0.38 IU/mL (ref 0.30–0.70)

## 2019-01-29 LAB — MAGNESIUM: Magnesium: 1.7 mg/dL (ref 1.7–2.4)

## 2019-01-29 MED ORDER — POTASSIUM CHLORIDE 10 MEQ/50ML IV SOLN
INTRAVENOUS | Status: AC
  Administered 2019-01-29: 10 meq via INTRAVENOUS
  Filled 2019-01-29: qty 50

## 2019-01-29 MED ORDER — POTASSIUM CHLORIDE 10 MEQ/50ML IV SOLN
10.0000 meq | INTRAVENOUS | Status: AC
  Administered 2019-01-29 (×5): 10 meq via INTRAVENOUS
  Filled 2019-01-29 (×4): qty 50

## 2019-01-29 MED ORDER — DEXTROSE 5 % IV SOLN
INTRAVENOUS | Status: AC
  Administered 2019-01-29: 11:00:00 via INTRAVENOUS

## 2019-01-29 MED ORDER — FREE WATER
200.0000 mL | Status: DC
  Administered 2019-01-29 – 2019-01-31 (×16): 200 mL

## 2019-01-29 MED ORDER — DEXTROSE 5 % IV SOLN
INTRAVENOUS | Status: DC
  Administered 2019-01-29 – 2019-01-30 (×2): via INTRAVENOUS

## 2019-01-29 MED ORDER — QUETIAPINE FUMARATE 50 MG PO TABS
50.0000 mg | ORAL_TABLET | Freq: Two times a day (BID) | ORAL | Status: DC
  Administered 2019-01-29 (×2): 50 mg via ORAL
  Filled 2019-01-29 (×3): qty 1

## 2019-01-29 NOTE — Progress Notes (Signed)
ANTICOAGULATION CONSULT NOTE   Pharmacy Consult for Heparin  Indication: chest pain/ACS  Patient Measurements: Height: 5\' 10"  (177.8 cm) Weight: 135 lb 9.3 oz (61.5 kg) IBW/kg (Calculated) : 73  Vital Signs: Temp: 98.6 F (37 C) (02/02 1000) Temp Source: Core (02/02 0753) BP: 146/86 (02/02 1000) Pulse Rate: 93 (02/02 1000)  Labs: Recent Labs    01/26/19 1220  01/26/19 1812 01/27/19 0115  01/27/19 0410  01/27/19 2032 01/28/19 0401 01/28/19 0515 01/28/19 1642 01/29/19 0410  HGB  --   --   --   --    < > 11.9*  --   --  12.0*  --   --  12.3*  HCT  --   --   --   --   --  35.2*  --   --  37.0*  --   --  38.1*  PLT  --   --   --   --   --  79*  80*  --   --  90*  --   --  108*  APTT  --   --   --   --   --  68*  --   --   --   --   --   --   LABPROT  --   --   --   --   --  14.1  --   --   --   --   --   --   INR  --   --   --   --   --  1.10  --   --   --   --   --   --   HEPARINUNFRC  --    < >  --   --   --   --   --   --   --  0.28* 0.38 0.38  CREATININE 0.98  --   --   --   --  1.00   < > 1.16 1.17  --   --  1.30*  TROPONINI 2.24*  --  1.76* 1.40*  --   --   --   --   --   --   --   --    < > = values in this interval not displayed.    Estimated Creatinine Clearance: 46.7 mL/min (A) (by C-G formula based on SCr of 1.3 mg/dL (H)).  Assessment: 68 y/o M with increasing troponin (0.64>>0.85>>1.26) to start heparin per pharmacy. CBC stable.  Heparin level within goal  Goal of Therapy:  Heparin level 0.3-0.7 units/ml Monitor platelets by anticoagulation protocol: Yes   Plan:  heparin infusion 950 units/hr Daily hep lvl cbc  Levester Fresh, PharmD, BCPS, BCCCP Clinical Pharmacist [phone]  Please check AMION for all [place] numbers  01/29/2019 10:55 AM

## 2019-01-29 NOTE — Progress Notes (Signed)
eLink Physician-Brief Progress Note Patient Name: Eric Dudley DOB: 1951/11/19 MRN: [id number]   Date of Service  01/29/2019  HPI/Events of Note  Request for AM lab orders.   eICU Interventions  Will order: 1. CBC with Platelets, BMP and Mg++ level in AM.      Intervention Category Major Interventions: Other:  , Cornelia Copa 01/29/2019, 2:32 AM

## 2019-01-29 NOTE — Progress Notes (Signed)
[place] Progress Note Patient Name: PAXTEN APPELT DOB: [birth year] MRN: [id number]   Date of Service  01/29/2019  HPI/Events of Note  K+ = 3.2 and Creatinine = 1.30.   eICU Interventions  Will replace K+.      Intervention Category Major Interventions: Electrolyte abnormality - evaluation and management  Sommer,Steven Eugene 01/29/2019, 6:19 AM

## 2019-01-29 NOTE — Progress Notes (Signed)
NAME:  Eric Dudley, MRN:  [id number], DOB:  [birth year], LOS: 6 ADMISSION DATE:  01/22/2019, CONSULTATION DATE:  01/03/2019 REFERRING MD:  Dr. Leonides Schanz, CHIEF COMPLAINT:  AMS/ shock  Brief History   2 yoM with recent URI symptoms, not eating well, drinking well with 3 day hx of dizziness, syncopal episode, and ?vomiting.  Brief arrest vs syncopal episode with EMS with < 60 sec CPR.  In ER, remained hypotensive and agitated, requiring intubation for airway protection.  Required pressors for hypotension despite IVF, found to have AKI, hypokalemia, and LLL PNA.  Started empiric abx.  PCCM to admit.  History of present illness   HPI obtained from medical chart and by conversation with wife as patient is sedated and intubated on mechanical ventilation.   68 year old male with history of nonsmoker, Multiple myeloma (dx 2016, in remission on Revlimid), HTN, HLD, previous NICM with improved EF on recent TTE to 65%, CAD, hx tonsillar cancer.  Wife reports patient has had a cold for the last two weeks after catching it from her.  He recently went to doctor and placed on antibiotics as his symptoms and cough did not improve. States he has not ate well since but has been drinking well.  3 days ago, patient developed dizziness and had syncopal episode at home.  He continued to feel back and fell again last night. He starting complaining of leg and back pain and said he had been vomiting, although wife has not witnessed any vomiting.   Wife called EMS and had brief episode of apnea and pulseless and received CPR for < 60 secs prior to patient becoming conscious during transport.  In ER, patient remained confused and agitated.  He was intubated for airway protection.  He was profoundly hypotensive, despite 4L IVF requiring vasopressors.  He was mildly hypothermic, not hypoxic, and bradycardic with several pauses into the 30's while in ER.  CT head and cervical neg, CT abd performed showing left lower lobe pneumoina and  mild hydronephrosis and distended bladder.  Labs showed Lactic 6.9-> 4.6, K 2.2, sCr 2.65, WBC 6.9, mild transaminitis, UA pending.  EKG SB, with initial diffuse STD, no STE and prolonged QTc.  Started on empiric antibiotics and PCCM called for admission.   Past Medical History  Multiple myeloma (dx 2016, in remission on Revlimid), HTN, HLD, non smoker, previous NICM with improved EF on recent TTE to 65%, CAD, hx tonsillar cancer  Significant Hospital Events   1/27 Admit  Consults:   Procedures:  1/27 ETT >> 1/27 R femoral TL CVL >> 1/27 foley >>  Significant Diagnostic Tests:  01/24/2019 CTH/ cervical  >> 1. No acute intracranial abnormality. 2. Diffuse bone marrow disease consistent with multiple myeloma.  01/03/2019 CT abd/ pelvis >> 1. Aspiration or pneumonia in the left more than right lung base. 2. Bilateral hydronephrosis and hydroureter above an over distended bladder. 3. Nephrolithiasis and cholelithiasis. 4. Sigmoid epiploic appendagitis of indeterminate chronicity.  Micro Data:  1/27 BCx 2 >> no growth in 24-hour 1/27 urine cx >> no growth 1/27 Resp cx  >> gram-positive rods on prelim. 1/27 MRSA PCR >> negative  Antimicrobials:  1/27 vanc x 1 1/27 flagyl x 1 1/27 aztreonam x 1 1/27>>Azithro >1/31 1/27 ceftriaxone >>1/31  Interim history/subjective:  Failed SBT this morning due to apnea. Still requiring high sedation  Objective   Blood pressure (!) 155/88, pulse (!) 101, temperature 99 F (37.2 C), resp. rate 17, height '5\' 10"'  (1.778 m),  weight 61.5 kg, SpO2 100 %.    Vent Mode: AC FiO2 (%):  [30 %] 30 % Set Rate:  [16 bmp] 16 bmp Vt Set:  [510 mL] 510 mL PEEP:  [5 cmH20] 5 cmH20 Plateau Pressure:  [13 cmH20-16 cmH20] 14 cmH20   Intake/Output Summary (Last 24 hours) at 01/29/2019 1021 Last data filed at 01/29/2019 0919 Gross per 24 hour  Intake 2005.33 ml  Output 3901 ml  Net -1895.67 ml   Filed Weights   01/27/19 0436 01/28/19 0440 01/29/19 0500    Weight: 65.5 kg 63.6 kg 61.5 kg    Physical Exam: General: Critically ill appearing male, agitated with stimulation HENT: [place], AT, ETT in place Eyes: EOMI, no scleral icterus Respiratory: Clear to auscultation bilaterally.  No crackles, wheezing or rales Cardiovascular: RRR, -M/R/G, no JVD GI: BS+, soft, nontender Extremities:-Edema,-tenderness Neuro: Agitated, unable to follow commands Skin: Intact, no rashes or bruising GU: Foley in place   Resolved Hospital Problem list   Septic Shock secondary to pneumonia AKI  Assessment & Plan:   Acute respiratory failure secondary to LLL pneumonia Chronic immunosuppression Shock has resolved with current antibiotic regimen. Respiratory culture with few candida tropicalis which likely represents contaminant. With improved clinical status, would not recommend additional infectious work-up this time including deferring bronchoscopy. S/p 5d course of CAP P:  - Mental status preclude extubation. If able to wean sedation, family wishes for one-way extubation. - Full vent support - VAP  Acute encephalopathy  Likely sedation related in setting of critical illness. CTH/ cervical neg P:  - Wean fentanyl gtt with goal RASS 0 to -1 - Increase seroquel to 50 mg BID  S/p PEA Arrest  Elevated Troponin thought secondary demand ischemia Prior cath 2016 with minimal CAD P: - Telemetry - ASA and Plavix - Heparin gtt - Cardiology following: Appreciate recs. Continue medical mgmt with no plans for cath  Severe electrolyte abnormalities   Hypokalemia, hypophosphatemia, hypomagnesemia and hypernatremia  - Replete as needed - Free water increased - D5 100cc/hr x 10 hours - Repeat PM BMP   Multiple myeloma - in remission P:  - Oncology following: Appreciate recs. Hold revlimid   Hx HTN, HLD P:  Hold zocor, losartan Continue daily ASA 35m    Best practice:  Diet: TF Pain/Anxiety/Delirium protocol (if indicated): fentanyl  VAP protocol  (if indicated): yes DVT prophylaxis: SCDs/ heparin GI prophylaxis: pepcid Glucose control: CBG q 4 Mobility: BR Code Status: DNR Family Communication: Updated wife at bedside Disposition: Remain in ICU  Labs   CBC: Recent Labs  Lab 01/17/2019 0255  01/25/19 0425 01/25/19 1552 01/26/19 0341 01/27/19 0410 01/28/19 0401 01/29/19 0410  WBC 6.9   < > 6.4  --  5.8 5.8 4.4 4.3  NEUTROABS 5.0  --   --   --   --   --   --  2.6  HGB 15.5   < > 13.6 11.6* 12.0* 11.9* 12.0* 12.3*  HCT 44.6   < > 39.6 34.0* 36.5* 35.2* 37.0* 38.1*  MCV 95.7   < > 98.8  --  101.1* 101.1* 101.6* 103.5*  PLT 191   < > 95*  --  71* 79*  80* 90* 108*   < > = values in this interval not displayed.    Basic Metabolic Panel: Recent Labs  Lab 01/26/19 1220 01/26/19 1547 01/27/19 0410 01/27/19 1349 01/27/19 2032 01/28/19 0401 01/29/19 0410  NA 141  --  145 145 150* 150* 153*  K 3.9  --  3.0* 3.3* 3.6 3.4* 3.2*  CL 116*  --  117* 116* 122* 122* 123*  CO2 18*  --  21* 22 21* 19* 22  GLUCOSE 100*  --  113* 118* 106* 105* 99  BUN 8  --  6* 6* 7* 5* 10  CREATININE 0.98  --  1.00 1.01 1.16 1.17 1.30*  CALCIUM 6.9*  --  7.8* 7.7* 8.5* 8.7* 8.9  MG 2.0 1.8 1.5* 2.1 2.1 1.8 1.7  PHOS 2.0* 2.1* 2.4*  --  2.3* 1.8*  --    GFR: Estimated Creatinine Clearance: 46.7 mL/min (A) (by C-G formula based on SCr of 1.3 mg/dL (H)). Recent Labs  Lab 01/14/2019 0255 01/15/2019 0500 01/17/2019 0736  01/26/19 0341 01/27/19 0410 01/28/19 0401 01/29/19 0410  WBC 6.9  --   --    < > 5.8 5.8 4.4 4.3  LATICACIDVEN 6.9* 4.6* 1.7  --   --   --   --   --    < > = values in this interval not displayed.    Liver Function Tests: Recent Labs  Lab 01/03/2019 0255 01/24/19 0745 01/25/19 0425 01/26/19 0341 01/26/19 1220 01/27/19 0410 01/27/19 2032 01/28/19 0401  AST 43* 30 40  --   --   --   --   --   ALT 46* 35 32  --   --   --   --   --   ALKPHOS 122 88 96  --   --   --   --   --   BILITOT 1.7* 0.9 0.5  --   --   --   --    --   PROT 5.7* 4.7* 4.5*  --   --   --   --   --   ALBUMIN 3.0* 2.1* 1.9* 1.8* 1.9* 2.0* 2.2* 2.1*   Recent Labs  Lab 01/08/2019 0255  LIPASE 32   Recent Labs  Lab 01/12/2019 0302  AMMONIA 25    ABG    Component Value Date/Time   PHART 7.301 (L) 01/29/2019 0905   PCO2ART 43.3 01/29/2019 0905   PO2ART 134 (H) 01/29/2019 0905   HCO3 20.7 01/29/2019 0905   TCO2 19 (L) 01/25/2019 1552   ACIDBASEDEF 4.6 (H) 01/29/2019 0905   O2SAT 98.4 01/29/2019 0905     Coagulation Profile: Recent Labs  Lab 01/27/19 0410  INR 1.10    Cardiac Enzymes: Recent Labs  Lab 01/26/19 0014 01/26/19 0619 01/26/19 1220 01/26/19 1812 01/27/19 0115  TROPONINI 1.59* 2.16* 2.24* 1.76* 1.40*    HbA1C: Hgb A1c MFr Bld  Date/Time Value Ref Range Status  01/28/2019 07:01 AM 5.4 4.8 - 5.6 % Final    Comment:    (NOTE) Pre diabetes:          5.7%-6.4% Diabetes:              >6.4% Glycemic control for   <7.0% adults with diabetes     CBG: Recent Labs  Lab 01/28/19 2042 01/29/19 0019 01/29/19 0028 01/29/19 0408 01/29/19 0757  GLUCAP 75 60* 86 80 75    The patient is critically ill with multiple organ systems failure and requires high complexity decision making for assessment and support, frequent evaluation and titration of therapies, application of advanced monitoring technologies and extensive interpretation of multiple databases.   Critical Care Time devoted to patient care services described in this note is 38 Minutes. This time reflects time of care of this signee Dr. Rodman Pickle. This critical  care time does not reflect procedure time, or teaching time or supervisory time of PA/NP/Med student/Med Resident etc but could involve care discussion time.

## 2019-01-29 NOTE — Progress Notes (Signed)
eLink Physician-Brief Progress Note Patient Name: Eric Dudley DOB: 1951-08-24 MRN: [id number]   Date of Service  01/29/2019  HPI/Events of Note  Blood glucose = 75 and Na+ = 147 - Order for D5W @ 100 mL/hour expired today.   eICU Interventions  Will re- order: 1. D5W to run IV at 100 mL/hour.      Intervention Category Major Interventions: Other:;Electrolyte abnormality - evaluation and management  Sommer,Steven Cornelia Copa 01/29/2019, 11:53 PM

## 2019-01-30 ENCOUNTER — Inpatient Hospital Stay (HOSPITAL_COMMUNITY): Payer: Medicare Other

## 2019-01-30 DIAGNOSIS — R7989 Other specified abnormal findings of blood chemistry: Secondary | ICD-10-CM

## 2019-01-30 LAB — GLUCOSE, CAPILLARY
Glucose-Capillary: 102 mg/dL — ABNORMAL HIGH (ref 70–99)
Glucose-Capillary: 112 mg/dL — ABNORMAL HIGH (ref 70–99)
Glucose-Capillary: 143 mg/dL — ABNORMAL HIGH (ref 70–99)
Glucose-Capillary: 74 mg/dL (ref 70–99)
Glucose-Capillary: 86 mg/dL (ref 70–99)
Glucose-Capillary: 96 mg/dL (ref 70–99)

## 2019-01-30 LAB — CBC WITH DIFFERENTIAL/PLATELET
Abs Immature Granulocytes: 0.02 10*3/uL (ref 0.00–0.07)
Basophils Absolute: 0 10*3/uL (ref 0.0–0.1)
Basophils Relative: 1 %
Eosinophils Absolute: 0.2 10*3/uL (ref 0.0–0.5)
Eosinophils Relative: 5 %
HCT: 35.3 % — ABNORMAL LOW (ref 39.0–52.0)
Hemoglobin: 11.4 g/dL — ABNORMAL LOW (ref 13.0–17.0)
Immature Granulocytes: 1 %
Lymphocytes Relative: 22 %
Lymphs Abs: 0.7 10*3/uL (ref 0.7–4.0)
MCH: 33.3 pg (ref 26.0–34.0)
MCHC: 32.3 g/dL (ref 30.0–36.0)
MCV: 103.2 fL — ABNORMAL HIGH (ref 80.0–100.0)
Monocytes Absolute: 0.7 10*3/uL (ref 0.1–1.0)
Monocytes Relative: 20 %
NEUTROS PCT: 51 %
Neutro Abs: 1.7 10*3/uL (ref 1.7–7.7)
Platelets: 98 10*3/uL — ABNORMAL LOW (ref 150–400)
RBC: 3.42 MIL/uL — AB (ref 4.22–5.81)
RDW: 15.7 % — ABNORMAL HIGH (ref 11.5–15.5)
WBC: 3.3 10*3/uL — ABNORMAL LOW (ref 4.0–10.5)
nRBC: 0 % (ref 0.0–0.2)

## 2019-01-30 LAB — MAGNESIUM
MAGNESIUM: 1.6 mg/dL — AB (ref 1.7–2.4)
Magnesium: 1.6 mg/dL — ABNORMAL LOW (ref 1.7–2.4)
Magnesium: 1.8 mg/dL (ref 1.7–2.4)

## 2019-01-30 LAB — BASIC METABOLIC PANEL
ANION GAP: 6 (ref 5–15)
BUN: 14 mg/dL (ref 8–23)
CO2: 20 mmol/L — ABNORMAL LOW (ref 22–32)
Calcium: 8.5 mg/dL — ABNORMAL LOW (ref 8.9–10.3)
Chloride: 119 mmol/L — ABNORMAL HIGH (ref 98–111)
Creatinine, Ser: 1.29 mg/dL — ABNORMAL HIGH (ref 0.61–1.24)
GFR calc Af Amer: >60 mL/min (ref >60)
GFR, EST NON AFRICAN AMERICAN: 56 mL/min — AB (ref >60)
Glucose, Bld: 137 mg/dL — ABNORMAL HIGH (ref 70–99)
Potassium: 2.9 mmol/L — ABNORMAL LOW (ref 3.5–5.1)
Sodium: 145 mmol/L (ref 135–145)

## 2019-01-30 LAB — PHOSPHORUS
Phosphorus: 1.8 mg/dL — ABNORMAL LOW (ref 2.5–4.6)
Phosphorus: 3.5 mg/dL (ref 2.5–4.6)

## 2019-01-30 LAB — HEPARIN LEVEL (UNFRACTIONATED): Heparin Unfractionated: 0.27 IU/mL — ABNORMAL LOW (ref 0.30–0.70)

## 2019-01-30 MED ORDER — PRO-STAT SUGAR FREE PO LIQD
30.0000 mL | Freq: Two times a day (BID) | ORAL | Status: DC
  Administered 2019-01-30 – 2019-02-01 (×4): 30 mL
  Filled 2019-01-30 (×4): qty 30

## 2019-01-30 MED ORDER — QUETIAPINE FUMARATE 50 MG PO TABS
50.0000 mg | ORAL_TABLET | Freq: Two times a day (BID) | ORAL | Status: DC
  Administered 2019-01-30 – 2019-02-01 (×5): 50 mg
  Filled 2019-01-30 (×4): qty 1

## 2019-01-30 MED ORDER — ASPIRIN 81 MG PO CHEW
81.0000 mg | CHEWABLE_TABLET | Freq: Every day | ORAL | Status: DC
  Administered 2019-01-30 – 2019-02-01 (×3): 81 mg
  Filled 2019-01-30 (×2): qty 1

## 2019-01-30 MED ORDER — MAGNESIUM SULFATE 2 GM/50ML IV SOLN
2.0000 g | Freq: Once | INTRAVENOUS | Status: AC
  Administered 2019-01-30: 2 g via INTRAVENOUS
  Filled 2019-01-30: qty 50

## 2019-01-30 MED ORDER — POLYETHYLENE GLYCOL 3350 17 G PO PACK
17.0000 g | PACK | Freq: Every day | ORAL | Status: DC
  Administered 2019-01-30 – 2019-02-01 (×3): 17 g
  Filled 2019-01-30 (×2): qty 1

## 2019-01-30 MED ORDER — FAMOTIDINE 40 MG/5ML PO SUSR
20.0000 mg | Freq: Every day | ORAL | Status: DC
  Administered 2019-01-30 – 2019-02-01 (×3): 20 mg
  Filled 2019-01-30 (×3): qty 2.5

## 2019-01-30 MED ORDER — VITAL AF 1.2 CAL PO LIQD
1000.0000 mL | ORAL | Status: DC
  Administered 2019-01-30 – 2019-01-31 (×2): 1000 mL

## 2019-01-30 MED ORDER — POTASSIUM PHOSPHATES 15 MMOLE/5ML IV SOLN
30.0000 mmol | Freq: Once | INTRAVENOUS | Status: AC
  Administered 2019-01-30: 30 mmol via INTRAVENOUS
  Filled 2019-01-30: qty 10

## 2019-01-30 MED ORDER — POTASSIUM CHLORIDE 10 MEQ/50ML IV SOLN
10.0000 meq | INTRAVENOUS | Status: DC
  Administered 2019-01-30 (×6): 10 meq via INTRAVENOUS
  Filled 2019-01-30 (×6): qty 50

## 2019-01-30 MED ORDER — CLOPIDOGREL BISULFATE 75 MG PO TABS
75.0000 mg | ORAL_TABLET | Freq: Every day | ORAL | Status: DC
  Administered 2019-01-30 – 2019-02-01 (×3): 75 mg
  Filled 2019-01-30 (×2): qty 1

## 2019-01-30 NOTE — Progress Notes (Signed)
 NAME:  Eric Dudley, MRN:  [id number], DOB:  09/28/1951, LOS: 7 ADMISSION DATE:  01/09/2019, CONSULTATION DATE:  01/15/2019 REFERRING MD:  Dr. Ward, CHIEF COMPLAINT:  AMS/ shock  Brief History   67 yoM with recent URI symptoms, not eating well, drinking well with 3 day hx of dizziness, syncopal episode, and ?vomiting.  Brief arrest vs syncopal episode with EMS with < 60 sec CPR.  In ER, remained hypotensive and agitated, requiring intubation for airway protection.  Required pressors for hypotension despite IVF, found to have AKI, hypokalemia, and LLL PNA.  Started empiric abx.  PCCM to admit.  History of present illness   HPI obtained from medical chart and by conversation with wife as patient is sedated and intubated on mechanical ventilation.   67 year old male with history of nonsmoker, Multiple myeloma (dx 2016, in remission on Revlimid), HTN, HLD, previous NICM with improved EF on recent TTE to 65%, CAD, hx tonsillar cancer.  Wife reports patient has had a cold for the last two weeks after catching it from her.  He recently went to doctor and placed on antibiotics as his symptoms and cough did not improve. States he has not ate well since but has been drinking well.  3 days ago, patient developed dizziness and had syncopal episode at home.  He continued to feel back and fell again last night. He starting complaining of leg and back pain and said he had been vomiting, although wife has not witnessed any vomiting.   Wife called EMS and had brief episode of apnea and pulseless and received CPR for < 60 secs prior to patient becoming conscious during transport.  In ER, patient remained confused and agitated.  He was intubated for airway protection.  He was profoundly hypotensive, despite 4L IVF requiring vasopressors.  He was mildly hypothermic, not hypoxic, and bradycardic with several pauses into the 30's while in ER.  CT head and cervical neg, CT abd performed showing left lower lobe pneumoina and  mild hydronephrosis and distended bladder.  Labs showed Lactic 6.9-> 4.6, K 2.2, sCr 2.65, WBC 6.9, mild transaminitis, UA pending.  EKG SB, with initial diffuse STD, no STE and prolonged QTc.  Started on empiric antibiotics and PCCM called for admission.   Past Medical History  Multiple myeloma (dx 2016, in remission on Revlimid), HTN, HLD, non smoker, previous NICM with improved EF on recent TTE to 65%, CAD, hx tonsillar cancer  Significant Hospital Events   1/27 Admit  Consults:   Procedures:  1/27 ETT >> 1/27 R femoral TL CVL >> 1/27 foley >>  Significant Diagnostic Tests:  01/14/2019 CTH/ cervical  >> 1. No acute intracranial abnormality. 2. Diffuse bone marrow disease consistent with multiple myeloma.  12/29/2018 CT abd/ pelvis >> 1. Aspiration or pneumonia in the left more than right lung base. 2. Bilateral hydronephrosis and hydroureter above an over distended bladder. 3. Nephrolithiasis and cholelithiasis. 4. Sigmoid epiploic appendagitis of indeterminate chronicity.  Micro Data:  1/27 BCx 2 >> no growth in 24-hour 1/27 urine cx >> no growth 1/27 Resp cx  >> gram-positive rods on prelim. 1/27 MRSA PCR >> negative  Antimicrobials:  1/27 vanc x 1 1/27 flagyl x 1 1/27 aztreonam x 1 1/27>>Azithro >1/31 1/27 ceftriaxone >>1/31  Interim history/subjective:  Remains intubated, sedated,  off pressors.  Objective   Blood pressure 105/67, pulse 71, temperature 97.9 F (36.6 C), resp. rate 16, height 5' 10" (1.778 m), weight 62 kg, SpO2 100 %.      Vent Mode: PRVC FiO2 (%):  [30 %] 30 % Set Rate:  [16 bmp] 16 bmp Vt Set:  [510 mL] 510 mL PEEP:  [5 cmH20] 5 cmH20 Plateau Pressure:  [14 cmH20-18 cmH20] 16 cmH20   Intake/Output Summary (Last 24 hours) at 01/30/2019 0841 Last data filed at 01/30/2019 0600 Gross per 24 hour  Intake 3198.83 ml  Output 3580 ml  Net -381.17 ml   Filed Weights   01/28/19 0440 01/29/19 0500 01/30/19 0353  Weight: 63.6 kg 61.5 kg 62 kg     Physical Exam: General: Frail, critically ill appearing older adult male, intubated, sedated, NAD HENT: NCAT, ETT secure, trachea midline  Eyes: PERLL 47m, anicteric  Respiratory: CTA B, no crackles, wheezing, rhonchi  Cardiovascular: RRR, no r/g/m, 2+ radial pulses 1+ pedal pulses GI: Soft, flat, non-distended, non-tender, normoactive x4 Extremities:No obvious deformity, symmetrical bulk and tone, no peripheral edema Neuro: Sedated, Awakens to voice. Follows simple commands.  Skin: Clean dry, cool intact GU: Foley    Resolved Hospital Problem list   Septic Shock secondary to pneumonia AKI  Assessment & Plan:   Acute respiratory failure -LLL PNA s/p abx course, chronic immunosuppression Shock has resolved with current antibiotic regimen. Respiratory culture with few candida tropicalis which likely represents contaminant. With improved clinical status, would not recommend additional infectious work-up this time including deferring bronchoscopy. S/p 5d course of CAP P:  -Per prior conversations with PCCM, if mental status improves, family prefers 1-way extubation. -AM SBT -CXR tomorrow -Continue full vent support-- PRVC titrating PEEP/FiOs for SpO2>94% -Continue pulm hygeine, VAP bundle   Acute encephalopathy  -sedation related vs ICU Delirium  -CTH/Cervical neg P:  - Goal RASS 0 to -1 -Wean fentanyl gtt as able -Previously did not tolerate precedex (bradycardia) however given resolution of shock, may be worth trying again if unable to wean fentanyl -Seroquel increased to 526mBID -monitor qtc qD -ICU Delirium precautions   S/p PEA Arrest -with elevated trops, likely demand ischemia Prior cath 2016 with minimal CAD P: - Cardiology following, appreciate recs - Per cards, medical management, no cath plans - Continue ASA, plavix - Continue heparin gtt-- follow up with Cardiology RE duration. Per 1/31 note, continue heparin gtt  Severe electrolyte abnormalities    Hypokalemia, hypophosphatemia, hypomagnesemia and hypernatremia  P: - Trend BMP, mag/phos - Repalce PRN - D5 started overnight for hypoglycemia and hypernatremia - Maintain EN at current rate, risk of refeeding syndrome   Multiple myeloma - in remission P:  - Oncology following: Appreciate recs. Hold revlimid   Hx HTN, HLD P:  Hold zocor, losartan Continue daily ASA 8152m  Best practice:  Diet: EN Pain/Anxiety/Delirium protocol (if indicated): Fentanyl, seroquel  VAP protocol (if indicated): yes DVT prophylaxis: SCDs/ heparin GI prophylaxis: pepcid Glucose control: CBG q 4 Mobility: BR Code Status: Limited-- no CPR/Defib Family Communication: No family at bedside Disposition: Continue ICU level of care  Labs   CBC: Recent Labs  Lab 01/26/19 0341 01/27/19 0410 01/28/19 0401 01/29/19 0410 01/30/19 0308  WBC 5.8 5.8 4.4 4.3 3.3*  NEUTROABS  --   --   --  2.6 1.7  HGB 12.0* 11.9* 12.0* 12.3* 11.4*  HCT 36.5* 35.2* 37.0* 38.1* 35.3*  MCV 101.1* 101.1* 101.6* 103.5* 103.2*  PLT 71* 79*  80* 90* 108* 98*    Basic Metabolic Panel: Recent Labs  Lab 01/26/19 1220 01/26/19 1547 01/27/19 0410 01/27/19 1349 01/27/19 2032 01/28/19 0401 01/29/19 0410 01/29/19 1617 01/30/19 0308  NA 141  --  145 145 150* 150* 153* 147* 145  K 3.9  --  3.0* 3.3* 3.6 3.4* 3.2* 3.8 2.9*  CL 116*  --  117* 116* 122* 122* 123* 119* 119*  CO2 18*  --  21* 22 21* 19* 22 20* 20*  GLUCOSE 100*  --  113* 118* 106* 105* 99 128* 137*  BUN 8  --  6* 6* 7* 5* _0 CREATININE 0.98  --  1.00 1.01 1.16 1.17 1.30* 1.34* 1.29*  CALCIUM 6.9*  --  7.8* 7.7* 8.5* 8.7* 8.9 8.8* 8.5*  MG 2.0 1.8 1.5* 2.1 2.1 1.8 1.7  --  1.6*  PHOS 2.0* 2.1* 2.4*  --  2.3* 1.8*  --   --   --    GFR: Estimated Creatinine Clearance: 47.4 mL/min (A) (by C-G formula based on SCr of 1.29 mg/dL (H)). Recent Labs  Lab 01/27/19 0410 01/28/19 0401 01/29/19 0410 01/30/19 0308  WBC 5.8 4.4 4.3 3.3*    Liver  Function Tests: Recent Labs  Lab 01/24/19 0745 01/25/19 0425 01/26/19 0341 01/26/19 1220 01/27/19 0410 01/27/19 2032 01/28/19 0401  AST 30 40  --   --   --   --   --   ALT 35 32  --   --   --   --   --   ALKPHOS 88 96  --   --   --   --   --   BILITOT 0.9 0.5  --   --   --   --   --   PROT 4.7* 4.5*  --   --   --   --   --   ALBUMIN 2.1* 1.9* 1.8* 1.9* 2.0* 2.2* 2.1*   No results for input(s): LIPASE, AMYLASE in the last 168 hours. No results for input(s): AMMONIA in the last 168 hours.  ABG    Component Value Date/Time   PHART 7.301 (L) 01/29/2019 0905   PCO2ART 43.3 01/29/2019 0905   PO2ART 134 (H) 01/29/2019 0905   HCO3 20.7 01/29/2019 0905   TCO2 19 (L) 01/25/2019 1552   ACIDBASEDEF 4.6 (H) 01/29/2019 0905   O2SAT 98.4 01/29/2019 0905     Coagulation Profile: Recent Labs  Lab 01/27/19 0410  INR 1.10    Cardiac Enzymes: Recent Labs  Lab 01/26/19 0014 01/26/19 0619 01/26/19 1220 01/26/19 1812 01/27/19 0115  TROPONINI 1.59* 2.16* 2.24* 1.76* 1.40*    HbA1C: Hgb A1c MFr Bld  Date/Time Value Ref Range Status  01/28/2019 07:01 AM 5.4 4.8 - 5.6 % Final    Comment:    (NOTE) Pre diabetes:          5.7%-6.4% Diabetes:              >6.4% Glycemic control for   <7.0% adults with diabetes     CBG: Recent Labs  Lab 01/29/19 1636 01/29/19 2056 01/29/19 2318 01/30/19 0352 01/30/19 0825  GLUCAP 79 102* 75 96 74    Critical Care Time: 40 minutes   Eliseo Gum MSN, AGACNP-BC [place] 01/30/2019, 8:41 AM .

## 2019-01-30 NOTE — Progress Notes (Signed)
ANTICOAGULATION CONSULT NOTE   Pharmacy Consult for Heparin  Indication: chest pain/ACS  Patient Measurements: Height: 5\' 10"  (177.8 cm) Weight: 136 lb 11 oz (62 kg) IBW/kg (Calculated) : 73  Vital Signs: Temp: 97.9 F (36.6 C) (02/03 0600) BP: 92/62 (02/03 0600) Pulse Rate: 65 (02/03 0600)  Labs: Recent Labs    01/28/19 0401  01/28/19 1642 01/29/19 0410 01/29/19 1617 01/30/19 0308  HGB 12.0*  --   --  12.3*  --  11.4*  HCT 37.0*  --   --  38.1*  --  35.3*  PLT 90*  --   --  108*  --  98*  HEPARINUNFRC  --    < > 0.38 0.38  --  0.27*  CREATININE 1.17  --   --  1.30* 1.34* 1.29*   < > = values in this interval not displayed.    Estimated Creatinine Clearance: 47.4 mL/min (A) (by C-G formula based on SCr of 1.29 mg/dL (H)).  Assessment: 68 y/o M with increasing troponin started on heparin per pharmacy. Currently on IV heparin at 950 units/hr. HL this AM is slightly subtherapeutic. H/H and Plt trending down. Of note, patient has some features of DIC but no active s/s of bleeding noted.    Goal of Therapy:  Heparin level 0.3-0.7 units/ml Monitor platelets by anticoagulation protocol: Yes   Plan:  Increase heparin infusion to 1000 units/hr. No bolus  Daily hep lvl cbc F/u duration of anticoag  Planning eventual non-invasive ischemic eval per cardiology   Albertina Parr, PharmD., BCPS Clinical Pharmacist Clinical phone for 01/30/19 until 3:30pm: [phone] If after 3:30pm, please refer to [facility] for unit-specific pharmacist

## 2019-01-30 NOTE — Progress Notes (Signed)
Nutrition Follow-up  DOCUMENTATION CODES:   Severe malnutrition in context of chronic illness  INTERVENTION:   Tube Feeding:  Increase Vital AF 1.2 to 30 ml/hr. Titrate by 10 q 12 hours until goal rate of 55 ml/hr Add Pro-Stat 30 mL BID Provides 129 g of protein, 1784 kcals, 1069 mL of free water Meets 104% calorie needs, 100% protein needs  Continue to check magnesium, phosphorus, magnesium (per CCM, plan to check q 12 hours) and aggressively replete  No BM in >/= 1 week; recommend adjusting bowel regimen   NUTRITION DIAGNOSIS:   Severe Malnutrition related to chronic illness(Multiple Myeloma) as evidenced by severe fat depletion, severe muscle depletion.  Being addressed via TF   GOAL:   Patient will meet greater than or equal to 90% of their needs  Priogressing  MONITOR:   TF tolerance, Labs, Weight trends, Vent status  REASON FOR ASSESSMENT:   Ventilator    ASSESSMENT:   68 yo male admitted with shock (septic vs hypovolemic from dehydration vs cardiogenic), acute respiratory failure requiring intubation, acute encephalopathy, AKI on CKD PMH includes multiple myeloma on oral chemo, HTN, HLD, CAD, hx of tonsillar cancer   Patient is currently intubated on ventilator support, fentanyl for sedation MV: 8.1 L/min Temp (24hrs), Avg:98.3 F (36.8 C), Min:97.7 F (36.5 C), Max:99.3 F (37.4 C)  Vital AF 1.2 @ 20 ml/hr; pt now with inadequate nutrition x 7 days.  Discussed concerns with CCM this AM. Ok to slowly begin titration of TF to goal rate with plans to continue to check magnesium, phosphorus, potassium q 12 hours and replete aggressively   Hypernatremic, hypoglycemic. Free water at 200 mL q 3 hours. D5 at 100 ml/hr  Current wt 62 kg; admission wt 61.7 kg. PT with mild generalized edema. Net - 4 L per I/O flow sheet  Constipated; no BM since PTA on 1/27  Labs:  potassium 2.9 (L), phosphorus 1.8 (L), magnesium wdl, sodium 145 (wdl) Meds: D5 at 100 ml/hr,  miralax added today   Diet Order:   Diet Order            Diet NPO time specified  Diet effective now              EDUCATION NEEDS:   Not appropriate for education at this time  Skin:  Skin Assessment: Skin Integrity Issues: Skin Integrity Issues:: DTI DTI: upper lip  Last BM:  PTA  Height:   Ht Readings from Last 1 Encounters:  01/08/2019 '5\' 10"'  (1.778 m)    Weight:   Wt Readings from Last 1 Encounters:  01/30/19 62 kg    Ideal Body Weight:  75.5 kg  BMI:  Body mass index is 19.61 kg/m.  Estimated Nutritional Needs:   Kcal:  1713 kcals   Protein:  110-125 g  Fluid:  >/= 1.7 L   Kerman Passey MS, RD, LDN, CNSC [phone] Pager  [phone] Weekend/On-Call Pager

## 2019-01-30 NOTE — Progress Notes (Signed)
K+ = eLink Physician-Brief Progress Note Patient Name: ADARIUS TIGGES DOB: 09-19-1951 MRN: [id number]   Date of Service  01/30/2019  HPI/Events of Note  K+ = 2.9, Mg++ = 1.9 and Creatinine = 1.29  eICU Interventions  Will replace K+ and Mg++.      Intervention Category Major Interventions: Electrolyte abnormality - evaluation and management  Sommer,Steven Eugene 01/30/2019, 4:55 AM

## 2019-01-30 NOTE — Progress Notes (Signed)
Changed patients ET tube holder.  Tube secure and in place.

## 2019-01-30 NOTE — Progress Notes (Addendum)
Progress Note  Patient Name: Eric Dudley Date of Encounter: 01/30/2019  Primary Cardiologist: Pixie Casino, MD   Subjective   Patient intubated. Wife at bedside. Questions about when the patient will be extubated. Discussed that from a cardiac standpoint his enzymes have downtrend and we would be making some medications changes today. All questions and concerns addressed.   Inpatient Medications    Scheduled Meds: . aspirin  81 mg Per Tube Daily  . chlorhexidine gluconate (MEDLINE KIT)  15 mL Mouth Rinse BID  . clopidogrel  75 mg Per Tube Daily  . famotidine  20 mg Per Tube Daily  . free water  200 mL Per Tube Q3H  . insulin aspart  0-15 Units Subcutaneous Q4H  . mouth rinse  15 mL Mouth Rinse 10 times per day  . polyethylene glycol  17 g Per Tube Daily  . potassium chloride  40 mEq Per Tube Once  . QUEtiapine  50 mg Per Tube BID   Continuous Infusions: . sodium chloride Stopped (01/29/19 1051)  . dextrose 100 mL/hr at 01/30/19 1019  . feeding supplement (VITAL AF 1.2 CAL) 20 mL/hr at 01/30/19 0600  . fentaNYL infusion INTRAVENOUS Stopped (01/30/19 0950)  . heparin 1,000 Units/hr (01/30/19 1000)  . magnesium sulfate 1 - 4 g bolus IVPB    . potassium chloride 10 mEq (01/30/19 1019)   PRN Meds: sodium chloride, albuterol, fentaNYL, haloperidol lactate, LORazepam   Vital Signs    Vitals:   01/30/19 0830 01/30/19 0858 01/30/19 0900 01/30/19 0930  BP: (!) 119/93  131/74 114/68  Pulse: 83 84  78  Resp: '19 16 16 16  ' Temp: 97.7 F (36.5 C) 97.9 F (36.6 C) 97.9 F (36.6 C) 98.1 F (36.7 C)  TempSrc:      SpO2: 100% 100%  100%  Weight:      Height:        Intake/Output Summary (Last 24 hours) at 01/30/2019 1104 Last data filed at 01/30/2019 1000 Gross per 24 hour  Intake 3582.62 ml  Output 3648 ml  Net -65.38 ml   Filed Weights   01/28/19 0440 01/29/19 0500 01/30/19 0353  Weight: 63.6 kg 61.5 kg 62 kg    Telemetry    Sinus rhythm with occasional sinus  tachycardia - Personally Reviewed  ECG    No new EKG.  Physical Exam   Today's Vitals   01/30/19 0830 01/30/19 0858 01/30/19 0900 01/30/19 0930  BP: (!) 119/93  131/74 114/68  Pulse: 83 84  78  Resp: '19 16 16 16  ' Temp: 97.7 F (36.5 C) 97.9 F (36.6 C) 97.9 F (36.6 C) 98.1 F (36.7 C)  TempSrc:      SpO2: 100% 100%  100%  Weight:      Height:      PainSc:       Body mass index is 19.61 kg/m.   GEN: No acute distress. Intubated Neck: No JVD Cardiac: RRR, no murmurs, rubs, or gallops.  Respiratory: Clear to auscultation bilaterally. GI: Soft, nontender, non-distended  MS: No edema; No deformity. Neuro:  Nonfocal  Psych: Normal affect   Labs    Chemistry Recent Labs  Lab 01/24/19 0745  01/25/19 0425  01/27/19 0410  01/27/19 2032 01/28/19 0401 01/29/19 0410 01/29/19 1617 01/30/19 0308  NA 151*   < > 144   < > 145   < > 150* 150* 153* 147* 145  K 3.1*   < > 2.9*   < >  3.0*   < > 3.6 3.4* 3.2* 3.8 2.9*  CL 125*   < > 120*   < > 117*   < > 122* 122* 123* 119* 119*  CO2 16*   < > 18*   < > 21*   < > 21* 19* 22 20* 20*  GLUCOSE 125*   < > 124*   < > 113*   < > 106* 105* 99 128* 137*  BUN 10   < > 6*   < > 6*   < > 7* 5* '10 12 14  ' CREATININE 1.73*   < > 1.31*   < > 1.00   < > 1.16 1.17 1.30* 1.34* 1.29*  CALCIUM 7.0*   < > 6.7*   < > 7.8*   < > 8.5* 8.7* 8.9 8.8* 8.5*  PROT 4.7*  --  4.5*  --   --   --   --   --   --   --   --   ALBUMIN 2.1*  --  1.9*   < > 2.0*  --  2.2* 2.1*  --   --   --   AST 30  --  40  --   --   --   --   --   --   --   --   ALT 35  --  32  --   --   --   --   --   --   --   --   ALKPHOS 88  --  96  --   --   --   --   --   --   --   --   BILITOT 0.9  --  0.5  --   --   --   --   --   --   --   --   GFRNONAA 39*   < > 55*   < > >60   < > >60 >60 56* 54* 56*  GFRAA 46*   < > >60   < > >60   < > >60 >60 >60 >60 >60  ANIONGAP 10   < > 6   < > 7   < > '7 9 8 8 6   ' < > = values in this interval not displayed.    Hematology Recent Labs  Lab  01/28/19 0401 01/29/19 0410 01/30/19 0308  WBC 4.4 4.3 3.3*  RBC 3.64* 3.68* 3.42*  HGB 12.0* 12.3* 11.4*  HCT 37.0* 38.1* 35.3*  MCV 101.6* 103.5* 103.2*  MCH 33.0 33.4 33.3  MCHC 32.4 32.3 32.3  RDW 15.0 15.7* 15.7*  PLT 90* 108* 98*   Cardiac Enzymes Recent Labs  Lab 01/26/19 0619 01/26/19 1220 01/26/19 1812 01/27/19 0115  TROPONINI 2.16* 2.24* 1.76* 1.40*   No results for input(s): TROPIPOC in the last 168 hours.   BNPNo results for input(s): BNP, PROBNP in the last 168 hours.   DDimer  Recent Labs  Lab 01/27/19 0410  DDIMER 2.77*    Radiology    No results found.  Cardiac Studies   Left Heart Cath 04/30/2015  Prox RCA lesion, 30% stenosed.  Ost LM to LM lesion, 30% stenosed.   1. Nonobstructive CAD 2. Normal LVEDP  TTE 01/24/2019  1. Extremely difficult study, with poor windows and poor acoustic transmission. Grossly normal LV function without significant valve abnormalities. RV appears moderately enlarged and moderately reduced function.  2. The left ventricle appears to be  normal in size, has normal wall thickness 50-55% ejection fraction Spectral Doppler shows indeterminate pattern of diastolic filling.  3. The right ventricle has moderately enlarged size and moderately reduced systolic function.  4. Normal left atrial size.  5. Normal right atrial size.  6. The mitral valve normal in structure and function.  7. Normal tricuspid valve.  8. Aortic valve normal.  9. The inferior vena cava was normal in size <50% respiratory variablity. 10. No atrial level shunt detected by color flow Doppler.  Patient Profile     68 y.o. male hypertension, hyperlipidemia, multiple myeloma, and CAD who presented to the emergency department with septic shock of pulmonary source. Workup revealed elevated troponin that peaked at 2.24 before trending downward. Cardiology was consulted for further evaluation and management.   Assessment & Plan   CAD  Demand Ischemia    - Troponin have trended down  - Echocardiogram on 01/24/2019 without new wall motion abnormalities  - Telemetry reviewed. NSR.  - Has been on heparin for >72 hours. Can discontinue heparin. - Replace magnesium and potassium  - Can pursue non-invasive outpatient stress testing once patient has recovered from acute illness  - Continue ASA and Plavix.  - Restart Losartan once hemodynamically stable  - Resume Simvastatin once able  - Initiate beta-blocker once hemodynamically stable, previously on Toprol-XL 12.5 mg QD  Hypertension  - Off pressors. MAP remains above 65.  - Holding home losartan   Septic Shock 2/2 LLL PNA - Off pressors for >24 hours  - Remains on vent support  - Completed Abx course - Vent management per PCCM  For questions or updates, please contact [place] Please consult [email or web address] for contact info under Cardiology/STEMI.     Signed, Ina Homes, MD  01/30/2019, 11:04 AM    ---------------------------------------------------------------------------------------------   History and all data above reviewed.  Patient examined.  I agree with the findings as above.  Eric Dudley is currently intubated, off of sedation and mildly agitated.  Constitutional: mildly agitated  Cardiovascular: regular rhythm, normal rate, no murmurs. S1 and S2 normal. Radial pulses normal bilaterally. No jugular venous distention.  Respiratory: clear to auscultation bilaterally GI : normal bowel sounds, soft and nontender. No distention.   MSK: extremities warm, well perfused. No edema.  NEURO:  moves all extremities. PSYCH: mildly agitated, weaning off sedation.  All available labs, radiology testing, previous records reviewed. Agree with documented assessment and plan of my colleague as stated above with the following additions or changes:  Active Problems:   Community acquired pneumonia   Shock ([place])   Protein-calorie malnutrition, severe   Acute encephalopathy    Pressure injury of skin    Plan: The patient is currently intubated but they are weaning sedation.  He responds to external stimuli but does not have purposeful responses currently.  He has been off of pressor support for greater than 24 hours.  His arterial line (femoral) has an adequate map and BP and his pulse is in the 90s.  He will likely tolerate reinitiation of medical therapy for coronary disease including his losartan and the addition of a low-dose beta-blocker, however with weaning sedation and failure of spontaneous breathing trials, we can likely wait on this until tomorrow.  When he gets agitated his blood pressure and pulse do rise.  Consider reinitiation of losartan tomorrow morning, and if pressure tolerates can consider initiation of low-dose beta-blockade tomorrow evening or the following day.  He has been reinitiated on aspirin and Plavix for  dual antiplatelet therapy which was prescribed in the setting of a non-STEMI for medical management.  This can be continued again for the indication of non-STEMI.  He will need an outpatient ischemia work-up, however the patient should convalesce further in the hospital prior to discussion about ischemia work-up especially given that the echocardiogram had no regional wall motion abnormalities.  Can discontinue heparin.  Cardiology will continue to follow.  Time Spent Directly with Patient:  I have spent a total of 35 minutes of critical care time with the patient reviewing hospital notes, telemetry, EKGs, labs and examining the patient as well as establishing an assessment and plan.  > 50% of time was spent in direct patient care.  Length of Stay:  LOS: 7 days   Elouise Munroe, MD [facility] 1:04 PM  01/30/2019

## 2019-01-30 NOTE — Progress Notes (Signed)
eLink Physician-Brief Progress Note Patient Name: Eric Dudley DOB: 17-Nov-1951 MRN: [id number]   Date of Service  01/30/2019  HPI/Events of Note  No AM labs ordered.   eICU Interventions  Will order CBC with Platelets, BMP and Mg++ at 5 AM.     Intervention Category Major Interventions: Other:  Caydn Justen Cornelia [place] 01/30/2019, 2:55 AM

## 2019-01-31 ENCOUNTER — Inpatient Hospital Stay (HOSPITAL_COMMUNITY): Payer: Medicare Other

## 2019-01-31 ENCOUNTER — Encounter (HOSPITAL_COMMUNITY): Payer: Self-pay

## 2019-01-31 ENCOUNTER — Other Ambulatory Visit: Payer: Self-pay

## 2019-01-31 DIAGNOSIS — C9001 Multiple myeloma in remission: Secondary | ICD-10-CM

## 2019-01-31 LAB — CBC
HCT: 39.5 % (ref 39.0–52.0)
Hemoglobin: 12.5 g/dL — ABNORMAL LOW (ref 13.0–17.0)
MCH: 32.7 pg (ref 26.0–34.0)
MCHC: 31.6 g/dL (ref 30.0–36.0)
MCV: 103.4 fL — ABNORMAL HIGH (ref 80.0–100.0)
Platelets: 134 10*3/uL — ABNORMAL LOW (ref 150–400)
RBC: 3.82 MIL/uL — ABNORMAL LOW (ref 4.22–5.81)
RDW: 15.4 % (ref 11.5–15.5)
WBC: 5.5 10*3/uL (ref 4.0–10.5)
nRBC: 0 % (ref 0.0–0.2)

## 2019-01-31 LAB — BASIC METABOLIC PANEL
Anion gap: 10 (ref 5–15)
BUN: 17 mg/dL (ref 8–23)
CO2: 15 mmol/L — AB (ref 22–32)
Calcium: 8.5 mg/dL — ABNORMAL LOW (ref 8.9–10.3)
Chloride: 109 mmol/L (ref 98–111)
Creatinine, Ser: 1.61 mg/dL — ABNORMAL HIGH (ref 0.61–1.24)
GFR calc Af Amer: 50 mL/min — ABNORMAL LOW (ref >60)
GFR calc non Af Amer: 43 mL/min — ABNORMAL LOW (ref >60)
Glucose, Bld: 150 mg/dL — ABNORMAL HIGH (ref 70–99)
Potassium: 3.6 mmol/L (ref 3.5–5.1)
Sodium: 134 mmol/L — ABNORMAL LOW (ref 135–145)

## 2019-01-31 LAB — GLUCOSE, CAPILLARY
GLUCOSE-CAPILLARY: 131 mg/dL — AB (ref 70–99)
Glucose-Capillary: 136 mg/dL — ABNORMAL HIGH (ref 70–99)
Glucose-Capillary: 108 mg/dL — ABNORMAL HIGH (ref 70–99)
Glucose-Capillary: 109 mg/dL — ABNORMAL HIGH (ref 70–99)
Glucose-Capillary: 101 mg/dL — ABNORMAL HIGH (ref 70–99)
Glucose-Capillary: 129 mg/dL — ABNORMAL HIGH (ref 70–99)

## 2019-01-31 LAB — MAGNESIUM
Magnesium: 1.7 mg/dL (ref 1.7–2.4)
Magnesium: 2.2 mg/dL (ref 1.7–2.4)

## 2019-01-31 LAB — PHOSPHORUS
PHOSPHORUS: 4.1 mg/dL (ref 2.5–4.6)
Phosphorus: 4 mg/dL (ref 2.5–4.6)

## 2019-01-31 MED ORDER — CLONAZEPAM 0.5 MG PO TABS
0.5000 mg | ORAL_TABLET | Freq: Two times a day (BID) | ORAL | Status: DC
  Administered 2019-01-31 – 2019-02-01 (×3): 0.5 mg via ORAL
  Filled 2019-01-31 (×3): qty 1

## 2019-01-31 MED ORDER — REVLIMID 5 MG PO CAPS: 5.0000 mg | ORAL_CAPSULE | ORAL | 0 refills | Status: AC

## 2019-01-31 MED ORDER — BISACODYL 10 MG RE SUPP
10.0000 mg | Freq: Once | RECTAL | Status: AC
  Administered 2019-01-31: 10 mg via RECTAL
  Filled 2019-01-31: qty 1

## 2019-01-31 MED ORDER — WHITE PETROLATUM EX OINT
TOPICAL_OINTMENT | CUTANEOUS | Status: AC
  Administered 2019-01-31: 0.2
  Filled 2019-01-31: qty 28.35

## 2019-01-31 MED ORDER — DEXTROSE 5 % IV SOLN
INTRAVENOUS | Status: DC
  Administered 2019-01-31 – 2019-02-01 (×3): via INTRAVENOUS

## 2019-01-31 MED ORDER — FUROSEMIDE 10 MG/ML IJ SOLN
20.0000 mg | Freq: Once | INTRAMUSCULAR | Status: AC
  Administered 2019-01-31: 20 mg via INTRAVENOUS
  Filled 2019-01-31: qty 2

## 2019-01-31 MED ORDER — SENNOSIDES-DOCUSATE SODIUM 8.6-50 MG PO TABS
1.0000 | ORAL_TABLET | Freq: Two times a day (BID) | ORAL | Status: DC
  Administered 2019-01-31 – 2019-02-01 (×3): 1
  Filled 2019-01-31 (×3): qty 1

## 2019-01-31 MED ORDER — FUROSEMIDE 10 MG/ML IJ SOLN: 20.0000 mg | Freq: Once | INTRAMUSCULAR | Status: DC

## 2019-01-31 MED ORDER — POTASSIUM CHLORIDE 20 MEQ PO PACK
20.0000 meq | PACK | Freq: Once | ORAL | Status: AC
  Administered 2019-01-31: 20 meq via ORAL
  Filled 2019-01-31: qty 1

## 2019-01-31 MED ORDER — MAGNESIUM SULFATE 2 GM/50ML IV SOLN
2.0000 g | Freq: Once | INTRAVENOUS | Status: AC
  Administered 2019-01-31: 2 g via INTRAVENOUS
  Filled 2019-01-31: qty 50

## 2019-01-31 MED ORDER — ENOXAPARIN SODIUM 40 MG/0.4ML ~~LOC~~ SOLN
40.0000 mg | SUBCUTANEOUS | Status: DC
  Administered 2019-01-31 – 2019-02-01 (×2): 40 mg via SUBCUTANEOUS
  Filled 2019-01-31 (×2): qty 0.4

## 2019-01-31 NOTE — Consult Note (Signed)
[facility] Nurse wound follow up Patient receiving care in [facility] 3M05.  No family present. 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  ETT remains in place, patient remains critically ill. Dressing procedure/placement/frequency: I have placed the petroleum ointment on a timed MAR order for 4 times daily.  Monitor the wound area(s) for worsening of condition such as: Signs/symptoms of infection,  Increase in size,  Development of or worsening of odor, Development of pain, or increased pain at the affected locations.  Notify the medical team if any of these develop.  Val Riles, RN, MSN, CWOCN, CNS-BC, pager [phone]

## 2019-01-31 NOTE — Progress Notes (Addendum)
Progress Note  Patient Name: Eric Dudley Date of Encounter: 01/31/2019  Primary Cardiologist: Pixie Casino, MD   Subjective   Intubated. No family at bedside. Sedated. RASS -4.   Inpatient Medications    Scheduled Meds: . aspirin  81 mg Per Tube Daily  . chlorhexidine gluconate (MEDLINE KIT)  15 mL Mouth Rinse BID  . clopidogrel  75 mg Per Tube Daily  . famotidine  20 mg Per Tube Daily  . feeding supplement (PRO-STAT SUGAR FREE 64)  30 mL Per Tube BID  . free water  200 mL Per Tube Q3H  . insulin aspart  0-15 Units Subcutaneous Q4H  . mouth rinse  15 mL Mouth Rinse 10 times per day  . polyethylene glycol  17 g Per Tube Daily  . potassium chloride  40 mEq Per Tube Once  . QUEtiapine  50 mg Per Tube BID   Continuous Infusions: . sodium chloride Stopped (01/29/19 1051)  . dextrose 100 mL/hr at 01/31/19 0500  . feeding supplement (VITAL AF 1.2 CAL) 55 mL/hr at 01/31/19 0500  . fentaNYL infusion INTRAVENOUS 400 mcg/hr (01/31/19 0500)  . magnesium sulfate 1 - 4 g bolus IVPB     PRN Meds: sodium chloride, albuterol, fentaNYL, haloperidol lactate, LORazepam   Vital Signs    Vitals:   01/31/19 0300 01/31/19 0400 01/31/19 0500 01/31/19 0700  BP: 116/71 123/79 135/82   Pulse: 91     Resp: '19 15 16   ' Temp:    98.3 F (36.8 C)  TempSrc:    Axillary  SpO2: 93% 98%    Weight:   61 kg   Height:        Intake/Output Summary (Last 24 hours) at 01/31/2019 0837 Last data filed at 01/31/2019 0500 Gross per 24 hour  Intake 4070.3 ml  Output 1021 ml  Net 3049.3 ml   Filed Weights   01/29/19 0500 01/30/19 0353 01/31/19 0500  Weight: 61.5 kg 62 kg 61 kg    Telemetry    Sinus rhythm with occasional sinus tachycardia - Personally Reviewed  ECG    No new EKG.  Physical Exam   Today's Vitals   01/31/19 0300 01/31/19 0400 01/31/19 0500 01/31/19 0700  BP: 116/71 123/79 135/82   Pulse: 91     Resp: '19 15 16   ' Temp:    98.3 F (36.8 C)  TempSrc:    Axillary    SpO2: 93% 98%    Weight:   61 kg   Height:      PainSc:       Body mass index is 19.3 kg/m.   GEN: No acute distress. Intubated  Neck: No JVD Cardiac: RRR, no murmurs, rubs, or gallops.  Respiratory: Clear to auscultation bilaterally. GI: Firm, nontender, non-distended  MS: Pitting edema of the bilateral upper extremities and on the hip/abdomen. Neuro:  Nonfocal. RASS -4 Psych: Normal affect   Labs    Chemistry Recent Labs  Lab 01/25/19 0425  01/27/19 0410  01/27/19 2032 01/28/19 0401  01/29/19 1617 01/30/19 0308 01/31/19 0500  NA 144   < > 145   < > 150* 150*   < > 147* 145 134*  K 2.9*   < > 3.0*   < > 3.6 3.4*   < > 3.8 2.9* 3.6  CL 120*   < > 117*   < > 122* 122*   < > 119* 119* 109  CO2 18*   < > 21*   < >  21* 19*   < > 20* 20* 15*  GLUCOSE 124*   < > 113*   < > 106* 105*   < > 128* 137* 150*  BUN 6*   < > 6*   < > 7* 5*   < > '12 14 17  ' CREATININE 1.31*   < > 1.00   < > 1.16 1.17   < > 1.34* 1.29* 1.61*  CALCIUM 6.7*   < > 7.8*   < > 8.5* 8.7*   < > 8.8* 8.5* 8.5*  PROT 4.5*  --   --   --   --   --   --   --   --   --   ALBUMIN 1.9*   < > 2.0*  --  2.2* 2.1*  --   --   --   --   AST 40  --   --   --   --   --   --   --   --   --   ALT 32  --   --   --   --   --   --   --   --   --   ALKPHOS 96  --   --   --   --   --   --   --   --   --   BILITOT 0.5  --   --   --   --   --   --   --   --   --   GFRNONAA 55*   < > >60   < > >60 >60   < > 54* 56* 43*  GFRAA >60   < > >60   < > >60 >60   < > >60 >60 50*  ANIONGAP 6   < > 7   < > 7 9   < > '8 6 10   ' < > = values in this interval not displayed.    Hematology Recent Labs  Lab 01/29/19 0410 01/30/19 0308 01/31/19 0500  WBC 4.3 3.3* 5.5  RBC 3.68* 3.42* 3.82*  HGB 12.3* 11.4* 12.5*  HCT 38.1* 35.3* 39.5  MCV 103.5* 103.2* 103.4*  MCH 33.4 33.3 32.7  MCHC 32.3 32.3 31.6  RDW 15.7* 15.7* 15.4  PLT 108* 98* 134*   Cardiac Enzymes Recent Labs  Lab 01/26/19 0619 01/26/19 1220 01/26/19 1812 01/27/19 0115   TROPONINI 2.16* 2.24* 1.76* 1.40*   No results for input(s): TROPIPOC in the last 168 hours.   BNPNo results for input(s): BNP, PROBNP in the last 168 hours.   DDimer  Recent Labs  Lab 01/27/19 0410  DDIMER 2.77*    Radiology    Dg Chest Port 1 View  Result Date: 01/31/2019 CLINICAL DATA:  Respiratory failure EXAM: PORTABLE CHEST 1 VIEW COMPARISON:  01/30/2019 FINDINGS: Endotracheal tube is 3 cm above the carina. Soft feeding tube is in the region of the antrum or pylorus. Mild volume loss/infiltrate at the right base appears similar. The remainder of the lungs are clear. No change or progressive finding. IMPRESSION: No change. Persistent infiltrate/atelectasis at the right lung base. Electronically Signed   By: Nelson Chimes M.D.   On: 01/31/2019 06:51   Dg Chest Port 1 View  Result Date: 01/30/2019 CLINICAL DATA:  ET tube reposition. EXAM: PORTABLE CHEST 1 VIEW COMPARISON:  01/27/2019. FINDINGS: Unchanged cardiomediastinal silhouette, likely within normal limits. Tortuous aorta. Asymmetric opacity at the RIGHT base appears similar  to priors. Small RIGHT effusion. ETT 2.3 cm above carina. No pneumothorax. IMPRESSION: Stable aeration. Asymmetric opacity RIGHT base. ETT 2.3 cm above carina. Electronically Signed   By: Staci Righter M.D.   On: 01/30/2019 12:44   Cardiac Studies   Left Heart Cath 04/30/2015  Prox RCA lesion, 30% stenosed.  Ost LM to LM lesion, 30% stenosed.   1. Nonobstructive CAD 2. Normal LVEDP  TTE 01/24/2019  1. Extremely difficult study, with poor windows and poor acoustic transmission. Grossly normal LV function without significant valve abnormalities. RV appears moderately enlarged and moderately reduced function.  2. The left ventricle appears to be normal in size, has normal wall thickness 50-55% ejection fraction Spectral Doppler shows indeterminate pattern of diastolic filling.  3. The right ventricle has moderately enlarged size and moderately reduced  systolic function.  4. Normal left atrial size.  5. Normal right atrial size.  6. The mitral valve normal in structure and function.  7. Normal tricuspid valve.  8. Aortic valve normal.  9. The inferior vena cava was normal in size <50% respiratory variablity. 10. No atrial level shunt detected by color flow Doppler.  Patient Profile     68 y.o. male hypertension, hyperlipidemia, multiple myeloma, and CAD who presented to the emergency department with septic shock of pulmonary source. Workup revealed elevated troponin that peaked at 2.24 before trending downward. Cardiology was consulted for further evaluation and management.   Assessment & Plan   CAD  Demand Ischemia   - Telemetry reviewed. NSR.  - Heparin discontinue yesterday  - Replace magnesium and potassium  - Continue ASA and Plavix.  - Switch patient to high intensity statin prior to discharge  - Can pursue non-invasive outpatient stress testing once patient has recovered from acute illness  - Patient feels cool and has pitting edema of the upper extremities and hips. Creatinine up to 1.61. Echo reviewed. LVEF normal. Albumin 2.1 on last check which may be contributing to his edema but would consider checking CVP and COOX. Based on COOX could consider diuretics.  Hypertension  - Off pressors. MAP remains above 65.  - Consider restarting Losartan 25 mg QD and Toprol-XL 12.5 mg QD once medically stable.   Septic Shock 2/2 LLL PNA - Off pressors for >24 hours  - Remains on vent support  - Completed Abx course - Vent management per PCCM  For questions or updates, please contact [facility] Please consult [email or web address] for contact info under Cardiology/STEMI.     Signed, Ina Homes, MD  01/31/2019, 8:37 AM   ---------------------------------------------------------------------------------------------   History and all data above reviewed.  Patient examined.  I agree with the findings as above.  Eric Dudley  remains on the vent and sedated.  Constitutional: sedated Cardiovascular: regular rhythm, normal rate, no murmurs. S1 and S2 normal. Radial pulses normal bilaterally. No jugular venous distention.  Respiratory: clear to auscultation bilaterally GI : normal bowel sounds, soft and nontender. No distention.   MSK: extremities warm, well perfused. Mild sacral edema. NEURO: unable to assess today. PSYCH: unable to assess due to intubated/sedated  All available labs, radiology testing, previous records reviewed. Agree with documented assessment and plan of my colleague as stated above with the following additions or changes:  Active Problems:   Community acquired pneumonia   Shock ([place])   Protein-calorie malnutrition, severe   Acute encephalopathy   Pressure injury of skin    Plan: discussed with primary team and will await further trials of sedation and SBE  prior to initiating medical therapy for CAD. Can consider gentle diuresis today however volume may be from third spacing vs intravascular volume overload.   Time Spent Directly with Patient:  I have spent a total of 35 minutes of critical care time with the patient reviewing hospital notes, telemetry, EKGs, labs and examining the patient as well as establishing an assessment and plan that was discussed with the primary service.  > 50% of time was spent in direct patient care.  Length of Stay:  LOS: 8 days   Elouise Munroe, MD [facility] 2:39 PM  01/31/2019

## 2019-01-31 NOTE — Progress Notes (Addendum)
NAME:  Eric Dudley, MRN:  [id number], DOB:  [birth year], LOS: 8 ADMISSION DATE:  12/30/2018, CONSULTATION DATE:  01/10/2019 REFERRING MD:  Dr. Leonides Schanz, CHIEF COMPLAINT:  AMS/ shock  Brief History   63 yoM with recent URI symptoms, not eating well, drinking well with 3 day hx of dizziness, syncopal episode, and ?vomiting.  Brief arrest vs syncopal episode with EMS with < 60 sec CPR.  In ER, remained hypotensive and agitated, requiring intubation for airway protection.  Required pressors for hypotension despite IVF, found to have AKI, hypokalemia, and LLL PNA.  Started empiric abx.  PCCM to admit.   Past Medical History  Multiple myeloma (dx 2016, in remission on Revlimid), HTN, HLD, non smoker, previous NICM with improved EF on recent TTE to 65%, CAD, hx tonsillar cancer  Significant Hospital Events   1/27 Admit  Consults:   Procedures:  1/27 ETT >> 1/27 R femoral TL CVL >> 1/27 foley >>  Significant Diagnostic Tests:  01/24/2019 CTH/ cervical  >> 1. No acute intracranial abnormality. 2. Diffuse bone marrow disease consistent with multiple myeloma.  01/20/2019 CT abd/ pelvis >> 1. Aspiration or pneumonia in the left more than right lung base. 2. Bilateral hydronephrosis and hydroureter above an over distended bladder. 3. Nephrolithiasis and cholelithiasis. 4. Sigmoid epiploic appendagitis of indeterminate chronicity.  2/3 CXR> persistent RLL atelectasis vs infiltrate   Micro Data:  1/27 BCx 2 >> no growth in 24-hour 1/27 urine cx >> no growth 1/27 Resp cx  >> gram-positive rods on prelim. 1/27 MRSA PCR >> negative  Antimicrobials:  1/27 vanc x 1 1/27 flagyl x 1 1/27 aztreonam x 1 1/27>>Azithro >1/31 1/27 ceftriaxone >>1/31  Interim history/subjective:   Remains HDS off pressors Requiring ongoing vent support   Objective   Blood pressure 111/66, pulse 84, temperature 98.3 F (36.8 C), temperature source Axillary, resp. rate 16, height _0  (1.778 m), weight 61 kg,  SpO2 100 %.    Vent Mode: PRVC FiO2 (%):  [30 %] 30 % Set Rate:  [16 bmp] 16 bmp Vt Set:  [510 mL] 510 mL PEEP:  [5 cmH20] 5 cmH20 Plateau Pressure:  [15 cmH20-19 cmH20] 16 cmH20   Intake/Output Summary (Last 24 hours) at 01/31/2019 0915 Last data filed at 01/31/2019 0500 Gross per 24 hour  Intake 3923.16 ml  Output 895 ml  Net 3028.16 ml   Filed Weights   01/29/19 0500 01/30/19 0353 01/31/19 0500  Weight: 61.5 kg 62 kg 61 kg    Physical Exam: General: Frail appearing older adult male, intubated sedated NAD HENT: NCAT ETT secure, dry mucus membranes Eyes: PERRL 22m, anicteric sclera Respiratory: CTA B, diminished bibasilarly, symmetrical chest wall expansion  Cardiovascular: RRR, no r/g/m. 1+ pedal pulses 2+ radial pulses  GI: Tense, mildly distended, hypoactive x4  Extremities: No obvious deformity, symmetrical bulk and tone, capillary refil < 3 sec BUE and 3 sec BLE Neuro: Sedated, awakens to voice, intermittently following commands  Skin: Clean, dry, scattered ecchymosis, cool to touch BLE  GU: Collecting clear yellow urine    Resolved Hospital Problem list   Septic Shock secondary to pneumonia AKI  Assessment & Plan:   Acute respiratory failure -LLL PNA s/p abx course, chronic immunosuppression Shock has resolved with current antibiotic regimen. Respiratory culture with few candida tropicalis which likely represents contaminant. With improved clinical status, would not recommend additional infectious work-up this time including deferring bronchoscopy. S/p 5d course of CAP -CXR shows stable atelectasis vs opacities, possible pulm edema  P:  -Per prior conversations with PCCM, if mental status improves, family prefers 1-way extubation. -Will need to talk to family RE wishes if patient does not have mental status improvement-- if family would want trach  -AM SBT/WUA -Continue full vent support-- PRVC titrating PEEP/FiOs for SpO2>94% -Continue aggressive pulm hygiene and  vent prevention  -20 IV Lasix today -AM CXR   Acute encephalopathy  -sedation related vs ICU Delirium  -CTH/Cervical neg P:  - Goal RASS 0 to -1  -Weaning fentanyl as able  -PRN BZD available, dc PRN risperidone  -Previously did not tolerate precedex (bradycardia) however given resolution of shock, may be worth trying again if unable to wean fentanyl -Continue quietapine 50 mg BID -monitor qtc  -ICU Delirium precautions  S/p PEA Arrest -with elevated trops, likely demand ischemia Prior cath 2016 with minimal CAD P: - Cardiology following, appreciate recs for medical management at this time -2/4 cards rec: hold beta blocker, check cvp and coox.  - Continue ASA, plavix -Heparin gtt discontinued yesterday   Severe electrolyte abnormalities   Hypokalemia, hypophosphatemia, hypomagnesemia  -hypernatremia, resolved  P: - Trend BMP, mag/phos - Repalce PRN - slow advancement of EN, risk of refeeding syndrome   Multiple myeloma - in remission P:  - Oncology following: Appreciate recs. Hold revlimid   Hx HTN, HLD P:  Hold zocor, losartan Continue daily ASA 31m  Constipation P Miralax, Senna, Dulcolax     Best practice:  Diet: EN Pain/Anxiety/Delirium protocol (if indicated): Fentanyl, seroquel, prn BZD  VAP protocol (if indicated): yes DVT prophylaxis: SCDs, lovenox GI prophylaxis: pepcid Glucose control: SSI  Mobility: BR Code Status: Limited-- no CPR/Defib Family Communication: none at bedside  Disposition: Continue ICU level of care   Labs   CBC: Recent Labs  Lab 01/27/19 0410 01/28/19 0401 01/29/19 0410 01/30/19 0308 01/31/19 0500  WBC 5.8 4.4 4.3 3.3* 5.5  NEUTROABS  --   --  2.6 1.7  --   HGB 11.9* 12.0* 12.3* 11.4* 12.5*  HCT 35.2* 37.0* 38.1* 35.3* 39.5  MCV 101.1* 101.6* 103.5* 103.2* 103.4*  PLT 79*  80* 90* 108* 98* 134*    Basic Metabolic Panel: Recent Labs  Lab 01/27/19 2032 01/28/19 0401 01/29/19 0410 01/29/19 1617  01/30/19 0308 01/30/19 1022 01/30/19 1706 01/31/19 0500  NA 150* 150* 153* 147* 145  --   --  134*  K 3.6 3.4* 3.2* 3.8 2.9*  --   --  3.6  CL 122* 122* 123* 119* 119*  --   --  109  CO2 21* 19* 22 20* 20*  --   --  15*  GLUCOSE 106* 105* 99 128* 137*  --   --  150*  BUN 7* 5* _0 --   --  17  CREATININE 1.16 1.17 1.30* 1.34* 1.29*  --   --  1.61*  CALCIUM 8.5* 8.7* 8.9 8.8* 8.5*  --   --  8.5*  MG 2.1 1.8 1.7  --  1.6* 1.8 1.6* 1.7  PHOS 2.3* 1.8*  --   --   --  1.8* 3.5 4.0   GFR: Estimated Creatinine Clearance: 37.4 mL/min (A) (by C-G formula based on SCr of 1.61 mg/dL (H)). Recent Labs  Lab 01/28/19 0401 01/29/19 0410 01/30/19 0308 01/31/19 0500  WBC 4.4 4.3 3.3* 5.5    Liver Function Tests: Recent Labs  Lab 01/25/19 0425 01/26/19 0341 01/26/19 1220 01/27/19 0410 01/27/19 2032 01/28/19 0401  AST 40  --   --   --   --   --  ALT 32  --   --   --   --   --   ALKPHOS 96  --   --   --   --   --   BILITOT 0.5  --   --   --   --   --   PROT 4.5*  --   --   --   --   --   ALBUMIN 1.9* 1.8* 1.9* 2.0* 2.2* 2.1*   No results for input(s): LIPASE, AMYLASE in the last 168 hours. No results for input(s): AMMONIA in the last 168 hours.  ABG    Component Value Date/Time   PHART 7.301 (L) 01/29/2019 0905   PCO2ART 43.3 01/29/2019 0905   PO2ART 134 (H) 01/29/2019 0905   HCO3 20.7 01/29/2019 0905   TCO2 19 (L) 01/25/2019 1552   ACIDBASEDEF 4.6 (H) 01/29/2019 0905   O2SAT 98.4 01/29/2019 0905     Coagulation Profile: Recent Labs  Lab 01/27/19 0410  INR 1.10    Cardiac Enzymes: Recent Labs  Lab 01/26/19 0014 01/26/19 0619 01/26/19 1220 01/26/19 1812 01/27/19 0115  TROPONINI 1.59* 2.16* 2.24* 1.76* 1.40*    HbA1C: Hgb A1c MFr Bld  Date/Time Value Ref Range Status  01/28/2019 07:01 AM 5.4 4.8 - 5.6 % Final    Comment:    (NOTE) Pre diabetes:          5.7%-6.4% Diabetes:              >6.4% Glycemic control for   <7.0% adults with diabetes      CBG: Recent Labs  Lab 01/30/19 1529 01/30/19 2004 01/30/19 2355 01/31/19 0447 01/31/19 0737  GLUCAP 102* 112* 143* 136* 108*    Critical Care Time: 35 minutes    Eliseo Gum MSN, AGACNP-BC [place] 01/31/2019, 9:15 AM .

## 2019-01-31 NOTE — Progress Notes (Signed)
Bed was soaked. unmeasurable urine output. Rectal pouch replaced with new one!

## 2019-02-01 ENCOUNTER — Inpatient Hospital Stay (HOSPITAL_COMMUNITY): Payer: Medicare Other

## 2019-02-01 LAB — BASIC METABOLIC PANEL
Anion gap: 8 (ref 5–15)
BUN: 28 mg/dL — AB (ref 8–23)
CO2: 18 mmol/L — ABNORMAL LOW (ref 22–32)
Calcium: 8.6 mg/dL — ABNORMAL LOW (ref 8.9–10.3)
Chloride: 110 mmol/L (ref 98–111)
Creatinine, Ser: 1.99 mg/dL — ABNORMAL HIGH (ref 0.61–1.24)
GFR calc Af Amer: 39 mL/min — ABNORMAL LOW (ref >60)
GFR calc non Af Amer: 33 mL/min — ABNORMAL LOW (ref >60)
GLUCOSE: 136 mg/dL — AB (ref 70–99)
Potassium: 3.6 mmol/L (ref 3.5–5.1)
Sodium: 136 mmol/L (ref 135–145)

## 2019-02-01 LAB — CBC
HCT: 35 % — ABNORMAL LOW (ref 39.0–52.0)
Hemoglobin: 11.2 g/dL — ABNORMAL LOW (ref 13.0–17.0)
MCH: 33.2 pg (ref 26.0–34.0)
MCHC: 32 g/dL (ref 30.0–36.0)
MCV: 103.9 fL — ABNORMAL HIGH (ref 80.0–100.0)
NRBC: 0 % (ref 0.0–0.2)
Platelets: 154 10*3/uL (ref 150–400)
RBC: 3.37 MIL/uL — ABNORMAL LOW (ref 4.22–5.81)
RDW: 15.3 % (ref 11.5–15.5)
WBC: 5.3 10*3/uL (ref 4.0–10.5)

## 2019-02-01 LAB — GLUCOSE, CAPILLARY
Glucose-Capillary: 124 mg/dL — ABNORMAL HIGH (ref 70–99)
Glucose-Capillary: 122 mg/dL — ABNORMAL HIGH (ref 70–99)
Glucose-Capillary: 119 mg/dL — ABNORMAL HIGH (ref 70–99)
Glucose-Capillary: 115 mg/dL — ABNORMAL HIGH (ref 70–99)

## 2019-02-01 LAB — MAGNESIUM: Magnesium: 2.2 mg/dL (ref 1.7–2.4)

## 2019-02-01 LAB — PHOSPHORUS: Phosphorus: 4.7 mg/dL — ABNORMAL HIGH (ref 2.5–4.6)

## 2019-02-01 MED ORDER — GLYCOPYRROLATE 0.2 MG/ML IJ SOLN: 0.2000 mg | INTRAMUSCULAR | Status: DC | PRN

## 2019-02-01 MED ORDER — ACETAMINOPHEN 650 MG RE SUPP: 650.0000 mg | Freq: Four times a day (QID) | RECTAL | Status: DC | PRN

## 2019-02-01 MED ORDER — POLYVINYL ALCOHOL 1.4 % OP SOLN
1.0000 [drp] | Freq: Four times a day (QID) | OPHTHALMIC | Status: DC | PRN
  Filled 2019-02-01: qty 15

## 2019-02-01 MED ORDER — MORPHINE BOLUS VIA INFUSION
5.0000 mg | INTRAVENOUS | Status: DC | PRN
  Administered 2019-02-01 – 2019-02-02 (×6): 5 mg via INTRAVENOUS
  Filled 2019-02-01: qty 5

## 2019-02-01 MED ORDER — MORPHINE SULFATE (PF) 2 MG/ML IV SOLN: 2.0000 mg | INTRAVENOUS | Status: DC | PRN

## 2019-02-01 MED ORDER — MORPHINE 100MG IN NS 100ML (1MG/ML) PREMIX INFUSION
0.0000 mg/h | INTRAVENOUS | Status: DC
  Administered 2019-02-01: 5 mg/h via INTRAVENOUS
  Administered 2019-02-01: 17 mg/h via INTRAVENOUS
  Administered 2019-02-02 – 2019-02-03 (×6): 20 mg/h via INTRAVENOUS
  Filled 2019-02-01 (×9): qty 100

## 2019-02-01 MED ORDER — DEXTROSE 5 % IV SOLN: INTRAVENOUS | Status: DC

## 2019-02-01 MED ORDER — GLYCOPYRROLATE 1 MG PO TABS
1.0000 mg | ORAL_TABLET | ORAL | Status: DC | PRN
  Filled 2019-02-01: qty 1

## 2019-02-01 MED ORDER — ACETAMINOPHEN 325 MG PO TABS: 650.0000 mg | ORAL_TABLET | Freq: Four times a day (QID) | ORAL | Status: DC | PRN

## 2019-02-01 MED ORDER — DIPHENHYDRAMINE HCL 50 MG/ML IJ SOLN: 25.0000 mg | INTRAMUSCULAR | Status: DC | PRN

## 2019-02-01 NOTE — Progress Notes (Signed)
PCCM progress note  Discussed again with family including wife and son They have had a chance to talk amongst themselves.  They feel that [name] would not want to be continued on life support.  They are requesting terminal withdrawal and comfort care Orders placed.  Eric Garfinkel MD [facility] 02/01/2019, 6:26 PM

## 2019-02-01 NOTE — Progress Notes (Signed)
RT Note: Pt extubated per withdraw orders.

## 2019-02-01 NOTE — Progress Notes (Signed)
NAME:  Eric Dudley, MRN:  [id number], DOB:  Feb 12, 1951, LOS: 9 ADMISSION DATE:  01/21/2019, CONSULTATION DATE:  01/12/2019 REFERRING MD:  Dr. Leonides Schanz, CHIEF COMPLAINT:  AMS/ shock  Brief History   66 yoM with recent URI symptoms, not eating well, drinking well with 3 day hx of dizziness, syncopal episode, and ?vomiting.  Brief arrest vs syncopal episode with EMS with < 60 sec CPR.  In ER, remained hypotensive and agitated, requiring intubation for airway protection.  Required pressors for hypotension despite IVF, found to have AKI, hypokalemia, and LLL PNA.  Started empiric abx.  PCCM to admit.  Past Medical History  Multiple myeloma (dx 2016, in remission on Revlimid), HTN, HLD, non smoker, previous NICM with improved EF on recent TTE to 65%, CAD, hx tonsillar cancer  Significant Hospital Events   1/27 Admit  Consults:   Procedures:  1/27 ETT >> 1/27 R femoral TL CVL >> 1/27 Foley >>  Significant Diagnostic Tests:  01/27/2019 CTH/ cervical  >> 1. No acute intracranial abnormality. 2. Diffuse bone marrow disease consistent with multiple myeloma.  12/29/2018 CT abd/ pelvis >> 1. Aspiration or pneumonia in the left more than right lung base. 2. Bilateral hydronephrosis and hydroureter above an over distended bladder. 3. Nephrolithiasis and cholelithiasis. 4. Sigmoid epiploic appendagitis of indeterminate chronicity.  2/3 CXR> persistent RLL atelectasis vs infiltrate   Micro Data:  1/27 BCx 2 >> no growth in 24-hour 1/27 urine cx >> no growth 1/27 Resp cx  >> gram-positive rods on prelim. 1/27 MRSA PCR >> negative  Antimicrobials:  1/27 vanc x 1 1/27 flagyl x 1 1/27 aztreonam x 1 1/27>>Azithro >1/31 1/27 ceftriaxone >>1/31  Interim history/subjective:  No acute events overnight. No weaning  Objective   Blood pressure (!) 91/44, pulse 68, temperature (!) 97.1 F (36.2 C), temperature source Axillary, resp. rate 16, height '5\' 10"'  (1.778 m), weight 66.1 kg, SpO2 100 %.   Vent Mode: PRVC FiO2 (%):  [30 %] 30 % Set Rate:  [16 bmp] 16 bmp Vt Set:  [510 mL] 510 mL PEEP:  [5 cmH20] 5 cmH20 Plateau Pressure:  [12 cmH20-16 cmH20] 12 cmH20   Intake/Output Summary (Last 24 hours) at 02/01/2019 4656 Last data filed at 02/01/2019 0700 Gross per 24 hour  Intake 4072.89 ml  Output 2300 ml  Net 1772.89 ml   Filed Weights   01/30/19 0353 01/31/19 0500 02/01/19 0104  Weight: 62 kg 61 kg 66.1 kg   Physical Exam: Gen:      Frail, elderly HEENT:  EOMI, sclera anicteric Neck:     No masses; no thyromegaly, ETT Lungs:    Clear to auscultation bilaterally; normal respiratory effort CV:         Regular rate and rhythm; no murmurs Abd:      + bowel sounds; soft, non-tender; no palpable masses, no distension Ext:    No edema; adequate peripheral perfusion Skin:      Warm and dry; no rash Neuro: [facility] Problem list   Septic Shock secondary to pneumonia AKI  Assessment & Plan:   Acute respiratory failure -LLL PNA s/p abx course, chronic immunosuppression Shock has resolved with current antibiotic regimen. Respiratory culture with few candida tropicalis which likely represents contaminant. With improved clinical status, would not recommend additional infectious work-up this time including deferring bronchoscopy. S/p 5d course of CAP -CXR shows stable atelectasis vs opacities, possible pulm edema  P:  Continue vent support,SBTs Weans as tolerated  Acute encephalopathy  -  sedation related vs ICU Delirium  -CTH/Cervical neg P:  Wean fentanyl -PRN BZD available, dc PRN risperidone  -Previously did not tolerate precedex (bradycardia) however given resolution of shock, may be worth trying again if unable to wean fentanyl -Continue quietapine 50 mg BID -monitor qtc  -ICU Delirium precautions  S/p PEA Arrest -with elevated trops, likely demand ischemia Prior cath 2016 with minimal CAD P: - Cardiology following, appreciate recs for medical  management at this time -2/4 cards rec: hold beta blocker, check cvp and coox.  - Continue ASA, plavix -Heparin gtt discontinued  Severe electrolyte abnormalities   Hypokalemia, hypophosphatemia, hypomagnesemia  -hypernatremia, resolved  P: - Trend BMP, mag/phos - Repalce PRN - slow advancement of EN, risk of refeeding syndrome   Multiple myeloma - in remission P:  - Oncology following: Appreciate recs. Hold revlimid   Hx HTN, HLD P:  Hold zocor, losartan Continue daily ASA 42m  Constipation P Miralax, Senna, Dulcolax   Goals of care P Discussed with wife and stepson at bedside They are clear that he would not want long-term intubation or tracheostomy He has made his wishes clear that he would not want to go to a nursing home or be supported in any way CODE STATUS is DNR We will plan on one-way extubation when family is ready.  Likely tomorrow 2/6   Best practice:  Diet: EN Pain/Anxiety/Delirium protocol (if indicated): Fentanyl, seroquel, prn BZD  VAP protocol (if indicated): yes DVT prophylaxis: SCDs, lovenox GI prophylaxis: pepcid Glucose control: SSI  Mobility: BR Code Status: Limited-- no CPR/Defib Family Communication: none at bedside  Disposition: Continue ICU level of care   Labs   CBC: Recent Labs  Lab 01/28/19 0401 01/29/19 0410 01/30/19 0308 01/31/19 0500 02/01/19 0451  WBC 4.4 4.3 3.3* 5.5 5.3  NEUTROABS  --  2.6 1.7  --   --   HGB 12.0* 12.3* 11.4* 12.5* 11.2*  HCT 37.0* 38.1* 35.3* 39.5 35.0*  MCV 101.6* 103.5* 103.2* 103.4* 103.9*  PLT 90* 108* 98* 134* 1196   Basic Metabolic Panel: Recent Labs  Lab 01/29/19 0410 01/29/19 1617 01/30/19 0308 01/30/19 1022 01/30/19 1706 01/31/19 0500 01/31/19 1609 02/01/19 0451  NA 153* 147* 145  --   --  134*  --  136  K 3.2* 3.8 2.9*  --   --  3.6  --  3.6  CL 123* 119* 119*  --   --  109  --  110  CO2 22 20* 20*  --   --  15*  --  18*  GLUCOSE 99 128* 137*  --   --  150*  --  136*  BUN  '10 12 14  ' --   --  17  --  28*  CREATININE 1.30* 1.34* 1.29*  --   --  1.61*  --  1.99*  CALCIUM 8.9 8.8* 8.5*  --   --  8.5*  --  8.6*  MG 1.7  --  1.6* 1.8 1.6* 1.7 2.2 2.2  PHOS  --   --   --  1.8* 3.5 4.0 4.1 4.7*   GFR: Estimated Creatinine Clearance: 32.8 mL/min (A) (by C-G formula based on SCr of 1.99 mg/dL (H)). Recent Labs  Lab 01/29/19 0410 01/30/19 0308 01/31/19 0500 02/01/19 0451  WBC 4.3 3.3* 5.5 5.3    Liver Function Tests: Recent Labs  Lab 01/26/19 0341 01/26/19 1220 01/27/19 0410 01/27/19 2032 01/28/19 0401  ALBUMIN 1.8* 1.9* 2.0* 2.2* 2.1*   No results  for input(s): LIPASE, AMYLASE in the last 168 hours. No results for input(s): AMMONIA in the last 168 hours.  ABG    Component Value Date/Time   PHART 7.301 (L) 01/29/2019 0905   PCO2ART 43.3 01/29/2019 0905   PO2ART 134 (H) 01/29/2019 0905   HCO3 20.7 01/29/2019 0905   TCO2 19 (L) 01/25/2019 1552   ACIDBASEDEF 4.6 (H) 01/29/2019 0905   O2SAT 98.4 01/29/2019 0905     Coagulation Profile: Recent Labs  Lab 01/27/19 0410  INR 1.10    Cardiac Enzymes: Recent Labs  Lab 01/26/19 0014 01/26/19 0619 01/26/19 1220 01/26/19 1812 01/27/19 0115  TROPONINI 1.59* 2.16* 2.24* 1.76* 1.40*    HbA1C: Hgb A1c MFr Bld  Date/Time Value Ref Range Status  01/28/2019 07:01 AM 5.4 4.8 - 5.6 % Final    Comment:    (NOTE) Pre diabetes:          5.7%-6.4% Diabetes:              >6.4% Glycemic control for   <7.0% adults with diabetes     CBG: Recent Labs  Lab 01/31/19 1559 01/31/19 1928 01/31/19 2324 02/01/19 0340 02/01/19 0808  GLUCAP 101* 131* 129* 124* 122*    The patient is critically ill with multiple organ system failure and requires high complexity decision making for assessment and support, frequent evaluation and titration of therapies, advanced monitoring, review of radiographic studies and interpretation of complex data.   Critical Care Time devoted to patient care services, exclusive  of separately billable procedures, described in this note is 35 minutes.   Marshell Garfinkel MD [facility] Pager [phone] If no answer call [phone] 02/01/2019, 9:58 AM.

## 2019-02-02 MED ORDER — ORAL CARE MOUTH RINSE
15.0000 mL | Freq: Two times a day (BID) | OROMUCOSAL | Status: DC
  Administered 2019-02-02 – 2019-02-11 (×16): 15 mL via OROMUCOSAL

## 2019-02-02 MED ORDER — CHLORHEXIDINE GLUCONATE 0.12 % MT SOLN
15.0000 mL | Freq: Two times a day (BID) | OROMUCOSAL | Status: DC
  Administered 2019-02-02 – 2019-02-11 (×15): 15 mL via OROMUCOSAL
  Filled 2019-02-02 (×11): qty 15

## 2019-02-02 NOTE — Progress Notes (Signed)
NAME:  Eric Dudley, MRN:  [id number], DOB:  [birth year], LOS: 76 ADMISSION DATE:  01/04/2019, CONSULTATION DATE:  01/20/2019 REFERRING MD:  Dr. Leonides Schanz, CHIEF COMPLAINT:  AMS/ shock  Brief History   33 yoM with recent URI symptoms, not eating well, drinking well with 3 day hx of dizziness, syncopal episode, and ?vomiting.  Brief arrest vs syncopal episode with EMS with < 60 sec CPR.  In ER, remained hypotensive and agitated, requiring intubation for airway protection.  Required pressors for hypotension despite IVF, found to have AKI, hypokalemia, and LLL PNA.  Started empiric abx.  PCCM to admit.  Past Medical History  Multiple myeloma (dx 2016, in remission on Revlimid), HTN, HLD, non smoker, previous NICM with improved EF on recent TTE to 65%, CAD, hx tonsillar cancer  Significant Hospital Events   1/27 Admit  Consults:   Procedures:  1/27 ETT >> 2/5 1/27 R femoral TL CVL >>  1/27 Foley >>  Significant Diagnostic Tests:  01/11/2019 CTH/ cervical  >> 1. No acute intracranial abnormality. 2. Diffuse bone marrow disease consistent with multiple myeloma.  01/19/2019 CT abd/ pelvis >> 1. Aspiration or pneumonia in the left more than right lung base. 2. Bilateral hydronephrosis and hydroureter above an over distended bladder. 3. Nephrolithiasis and cholelithiasis. 4. Sigmoid epiploic appendagitis of indeterminate chronicity.  2/3 CXR> persistent RLL atelectasis vs infiltrate   Micro Data:  1/27 BCx 2 >> no growth in 24-hour 1/27 urine cx >> no growth 1/27 Resp cx  >> gram-positive rods on prelim. 1/27 MRSA PCR >> negative  Antimicrobials:  1/27 vanc x 1 1/27 flagyl x 1 1/27 aztreonam x 1 1/27>>Azithro >1/31 1/27 ceftriaxone >>1/31  Interim history/subjective:  Patient transitioned to comfort care yesterday afternoon, terminally extubated   Objective   Blood pressure 100/62, pulse 77, temperature (!) 97 F (36.1 C), temperature source Axillary, resp. rate 13, height '5\' 10"'   (1.778 m), weight 66.1 kg, SpO2 100 %.    Vent Mode: PRVC FiO2 (%):  [30 %] 30 % Set Rate:  [16 bmp] 16 bmp Vt Set:  [510 mL] 510 mL PEEP:  [5 cmH20] 5 cmH20 Plateau Pressure:  [14 cmH20-15 cmH20] 15 cmH20   Intake/Output Summary (Last 24 hours) at 02/02/2019 0843 Last data filed at 02/02/2019 0700 Gross per 24 hour  Intake 1899.87 ml  Output 800 ml  Net 1099.87 ml   Filed Weights   01/31/19 0500 02/01/19 0104 02/01/19 1700  Weight: 61 kg 66.1 kg 66.1 kg   Physical Exam: Gen:      Frail elderly appearing male, in bed, NAD  HEENT: NCAT, dry mucus membranes Lungs:    Diminished bilaterally, no accessory muscle recruitment  CV:         RRR, s1s2, no rg/m  Abd:      Soft, round, ndnt, bowel sounds +  Ext:    Symmetrical bulk and tone. Capillary refil < 3 sec Skin:      Clean, dry, warm  Neuro:  Sedated, does not open eyes to voice, noxious stimuli. Does not follow commands   Resolved Hospital Problem list   Septic Shock secondary to pneumonia AKI  Assessment & Plan:   Acute respiratory failure -LLL PNA s/p abx course, chronic immunosuppression Shock has resolved with current antibiotic regimen. Respiratory culture with few candida tropicalis which likely represents contaminant. With improved clinical status, would not recommend additional infectious work-up this time including deferring bronchoscopy. S/p 5d course of CAP -CXR shows stable atelectasis vs  opacities, possible pulm edema  P:  Terminal extubation yesterday Morphine gtt PRN ativan and robinul available   Acute encephalopathy  -sedation related vs ICU Delirium  -CTH/Cervical neg P:  Comfort care Morphing gtt, PRN ativan   S/p PEA Arrest -with elevated trops, likely demand ischemia Prior cath 2016 with minimal CAD P: - Comfort care  Severe electrolyte abnormalities   Hypokalemia, hypophosphatemia, hypomagnesemia  -hypernatremia, resolved  P: Comfort Care No further labs  Multiple myeloma - in  remission P:  Comfort care  Hx HTN, HLD P:  Comfort care     Best practice:  Diet: None, comfort care  Pain/Anxiety/Delirium protocol (if indicated): morphine, ativan  VAP protocol (if indicated): no DVT prophylaxis: comfort care GI prophylaxis: comfort care Glucose control: comfort care Mobility: BR Code Status:DNR, comfort care Family Communication: none at bedside  Disposition: Comfort care in ICU   Labs   CBC: Recent Labs  Lab 01/28/19 0401 01/29/19 0410 01/30/19 0308 01/31/19 0500 02/01/19 0451  WBC 4.4 4.3 3.3* 5.5 5.3  NEUTROABS  --  2.6 1.7  --   --   HGB 12.0* 12.3* 11.4* 12.5* 11.2*  HCT 37.0* 38.1* 35.3* 39.5 35.0*  MCV 101.6* 103.5* 103.2* 103.4* 103.9*  PLT 90* 108* 98* 134* 767    Basic Metabolic Panel: Recent Labs  Lab 01/29/19 0410 01/29/19 1617 01/30/19 0308 01/30/19 1022 01/30/19 1706 01/31/19 0500 01/31/19 1609 02/01/19 0451  NA 153* 147* 145  --   --  134*  --  136  K 3.2* 3.8 2.9*  --   --  3.6  --  3.6  CL 123* 119* 119*  --   --  109  --  110  CO2 22 20* 20*  --   --  15*  --  18*  GLUCOSE 99 128* 137*  --   --  150*  --  136*  BUN '10 12 14  ' --   --  17  --  28*  CREATININE 1.30* 1.34* 1.29*  --   --  1.61*  --  1.99*  CALCIUM 8.9 8.8* 8.5*  --   --  8.5*  --  8.6*  MG 1.7  --  1.6* 1.8 1.6* 1.7 2.2 2.2  PHOS  --   --   --  1.8* 3.5 4.0 4.1 4.7*   GFR: Estimated Creatinine Clearance: 32.8 mL/min (A) (by C-G formula based on SCr of 1.99 mg/dL (H)). Recent Labs  Lab 01/29/19 0410 01/30/19 0308 01/31/19 0500 02/01/19 0451  WBC 4.3 3.3* 5.5 5.3    Liver Function Tests: Recent Labs  Lab 01/26/19 1220 01/27/19 0410 01/27/19 2032 01/28/19 0401  ALBUMIN 1.9* 2.0* 2.2* 2.1*   No results for input(s): LIPASE, AMYLASE in the last 168 hours. No results for input(s): AMMONIA in the last 168 hours.  ABG    Component Value Date/Time   PHART 7.301 (L) 01/29/2019 0905   PCO2ART 43.3 01/29/2019 0905   PO2ART 134 (H)  01/29/2019 0905   HCO3 20.7 01/29/2019 0905   TCO2 19 (L) 01/25/2019 1552   ACIDBASEDEF 4.6 (H) 01/29/2019 0905   O2SAT 98.4 01/29/2019 0905     Coagulation Profile: Recent Labs  Lab 01/27/19 0410  INR 1.10    Cardiac Enzymes: Recent Labs  Lab 01/26/19 1220 01/26/19 1812 01/27/19 0115  TROPONINI 2.24* 1.76* 1.40*    HbA1C: Hgb A1c MFr Bld  Date/Time Value Ref Range Status  01/28/2019 07:01 AM 5.4 4.8 - 5.6 % Final  Comment:    (NOTE) Pre diabetes:          5.7%-6.4% Diabetes:              >6.4% Glycemic control for   <7.0% adults with diabetes     CBG: Recent Labs  Lab 01/31/19 2324 02/01/19 0340 02/01/19 0808 02/01/19 1102 02/01/19 1522  GLUCAP 129* 124* 122* 119* Sen[place] MSN, AGACNP-BC [facility] 02/02/2019, 8:43 AM

## 2019-02-02 NOTE — Progress Notes (Signed)
NURSING PROGRESS NOTE  Eric Dudley [id number] Transfer Data: 02/02/2019 5:50 PM Attending Provider: Marshell Garfinkel, MD [id number], Mauro Kaufmann, MD Code Status: DNR   Eric Dudley is a 68 y.o. male patient transferred from 79M to 6N  Blood pressure 132/76, pulse 87, temperature (!) 97 F (36.1 C), temperature source Axillary, resp. rate (!) 24, height '5\' 10"'  (1.778 m), weight 66.1 kg, SpO2 95 %.   IV Fluids:  IV in place, occlusive dsg intact without redness, morphine infusing.   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  Past Surgical History:   has a past surgical history that includes Appendectomy (1962); Shoulder surgery (1999); Knee surgery (2012); Cardiac catheterization (N/A, 04/30/2015); and CARDIOLITE MYOCARDIAL PERFUSION STUDY (04/16/03).  Social History:   reports that he quit smoking about 15 years ago. His smoking use included cigars. He quit smokeless tobacco use about 24 years ago.  His smokeless tobacco use included chew. He reports that he does not drink alcohol or use drugs.  Bedside report given to [facility].

## 2019-02-02 NOTE — Progress Notes (Signed)
Nutrition Brief Note  Chart reviewed. Pt now transitioning to comfort care.  No further nutrition interventions warranted at this time.  Please re-consult as needed.    Atianna Haidar, RD, LDN, CNSC Pager [phone] After Hours Pager [phone]    

## 2019-02-03 DIAGNOSIS — Z515 Encounter for palliative care: Secondary | ICD-10-CM

## 2019-02-03 DIAGNOSIS — R52 Pain, unspecified: Secondary | ICD-10-CM

## 2019-02-03 DIAGNOSIS — N179 Acute kidney failure, unspecified: Secondary | ICD-10-CM

## 2019-02-03 MED ORDER — LORAZEPAM 2 MG/ML IJ SOLN: 2.0000 mg | INTRAMUSCULAR | Status: AC

## 2019-02-03 MED ORDER — LORAZEPAM 2 MG/ML IJ SOLN: 2.0000 mg | INTRAMUSCULAR | Status: DC | PRN

## 2019-02-03 MED ORDER — LORAZEPAM 2 MG/ML IJ SOLN
2.0000 mg | Freq: Once | INTRAMUSCULAR | Status: AC
  Administered 2019-02-03: 2 mg via INTRAVENOUS

## 2019-02-03 MED ORDER — LORAZEPAM 2 MG/ML IJ SOLN: 0.5000 mg | INTRAMUSCULAR | Status: DC

## 2019-02-03 MED ORDER — LORAZEPAM 2 MG/ML IJ SOLN
INTRAMUSCULAR | Status: AC
  Filled 2019-02-03: qty 1

## 2019-02-03 MED ORDER — SODIUM CHLORIDE 0.9 % IV SOLN
2.5000 mg/h | INTRAVENOUS | Status: DC
  Administered 2019-02-03 – 2019-02-11 (×20): 2.5 mg/h via INTRAVENOUS
  Filled 2019-02-03 (×22): qty 2.5

## 2019-02-03 MED ORDER — LORAZEPAM 2 MG/ML IJ SOLN
1.0000 mg | INTRAMUSCULAR | Status: DC
  Administered 2019-02-03 – 2019-02-09 (×34): 1 mg via INTRAVENOUS
  Filled 2019-02-03 (×33): qty 1

## 2019-02-03 MED ORDER — LORAZEPAM 2 MG/ML IJ SOLN: 1.0000 mg | INTRAMUSCULAR | Status: DC

## 2019-02-03 MED ORDER — MORPHINE BOLUS VIA INFUSION
5.0000 mg | INTRAVENOUS | Status: DC | PRN
  Administered 2019-02-03: 5 mg via INTRAVENOUS
  Filled 2019-02-03: qty 5

## 2019-02-03 MED ORDER — HYDROMORPHONE BOLUS VIA INFUSION
2.0000 mg | INTRAVENOUS | Status: DC | PRN
  Administered 2019-02-04 – 2019-02-11 (×3): 2 mg via INTRAVENOUS
  Filled 2019-02-03: qty 2

## 2019-02-03 NOTE — Consult Note (Signed)
   [facility] CM Inpatient Consult   02/03/2019  [name] 08/18/1951 [id number]     Patient screened for potential [facility] Care Management services due to unplanned readmission risk score of 26% (high).  Chart reviewed. Noted [name] is under comfort measures. Palliative Medicine Team following. No identifiable [facility] Care Management needs.   Marthenia Rolling, MSN-Ed, RN,BSN [facility] Liaison [phone]

## 2019-02-03 NOTE — Progress Notes (Signed)
Pt very restless. Trying to raise up in bed with his hand on the bed rails. Waving arms in the air. Administered PRN ativan. Will continue to monitor.

## 2019-02-03 NOTE — Social Work (Signed)
Acknowledging pt arrival to 6N, pt under comfort measures.  Will follow for disposition should hospice services or residential hospice become appropriate.   Alexander Mt, [place] Work [phone]

## 2019-02-03 NOTE — Progress Notes (Addendum)
Daily Progress Note   Patient Name: Eric Dudley       Date: 02/03/2019 DOB: 11-30-1951  Age: 68 y.o. MRN#: [id number] Attending Physician: Marshell Garfinkel, MD Primary Care Physician: [name], MD Admit Date: 01/10/2019  Reason for Consultation/Follow-up: Non pain symptom management, Pain control, Psychosocial/spiritual support and Terminal Care  Subjective: Patient seen, chart reviewed.Doristine Bosworth with bedside RN who reports seeing what sounds like myoclonus intermittently as well as agitation where patient is trying to climb out of bed.  Patient is currently on morphine continuous infusion at 20 mg an hour  Length of Stay: 11  Current Medications: Scheduled Meds:  . chlorhexidine  15 mL Mouth Rinse BID  . LORazepam      . LORazepam  1 mg Intravenous Q4H  . mouth rinse  15 mL Mouth Rinse q12n4p    Continuous Infusions: . sodium chloride Stopped (02/01/19 1658)  . HYDROmorphone 2.5 mg/hr (02/03/19 1159)    PRN Meds: sodium chloride, albuterol, diphenhydrAMINE, glycopyrrolate **OR** glycopyrrolate **OR** glycopyrrolate, HYDROmorphone, LORazepam, polyvinyl alcohol  Physical Exam Vitals signs and nursing note reviewed.  Constitutional:      Appearance: He is ill-appearing.     Comments: Minimally responsive; alternating with acute agitation Transitioning towards EOL  HENT:     Head: Normocephalic and atraumatic.  Pulmonary:     Comments: irrg pattern with brief apnea Skin:    Comments: Cool LE; no mottling  Neurological:     Comments: Minimally responsive. No myoclonus observed but staff reporting  Psychiatric:     Comments: Agitation, trying to climb OOB             Vital Signs: BP (!) 148/75 (BP Location: Left Arm)   Pulse 95   Temp 97.6 F (36.4 C)  (Oral)   Resp (!) 22   Ht '5\' 10"'  (1.778 m)   Wt 66.1 kg   SpO2 92%   BMI 20.91 kg/m  SpO2: SpO2: 92 % O2 Device: O2 Device: Room Air O2 Flow Rate:    Intake/output summary:   Intake/Output Summary (Last 24 hours) at 02/03/2019 1254 Last data filed at 02/03/2019 0525 Gross per 24 hour  Intake 100.64 ml  Output 1950 ml  Net -1849.36 ml   LBM: Last BM Date: 02/02/19 Baseline Weight: Weight: 59 kg Most recent weight: Weight:  66.1 kg       Palliative Assessment/Data:      Patient Active Problem List   Diagnosis Date Noted  . Pressure injury of skin 01/27/2019  . Acute encephalopathy   . Protein-calorie malnutrition, severe 01/25/2019  . Shock ([place]) 01/11/2019  . Polycythemia vera ([place]) 06/16/2018  . Community acquired pneumonia 08/31/2017  . Sepsis ([place]) 08/31/2017  . Shingles 04/22/2017  . Thoracic compression fracture ([place])   . Osteoporosis 05/18/2015  . NSTEMI (non-ST elevated myocardial infarction) ([place]) 05/01/2015  . Cardiomyopathy, nonischemic ([place])   . ST elevation myocardial infarction involving left anterior descending (LAD) coronary artery ([place])   . AKI (acute kidney injury) ([place])   . Vertebral compression fracture: T12 pathological fracture 04/17/2015  . T12 compression fracture ([place])   . Thoracic back pain   . Multiple myeloma in remission ([place]) 04/03/2015  . Essential hypertension 07/08/2013  . Mixed hyperlipidemia 07/08/2013  . Tonsillar cancer ([place]) 07/08/2013  . 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  Has received IV fluids (6L), remains hypotensive.  Consult ordered for goals of care.  Patient is now extubated and family pursuing comfort care Assessment: He appears to be transitioning towards end-of-life.   He is nonverbal and unable to follow commands; does not take anything by mouth.  He has fluctuating levels of consciousness with between when he looks at peace alternating with acute restlessness  Recommendations/Plan:  Pain: We will DC morphine and engage in opioid rotation to Dilaudid 2.5 mg an hour and 2 mg every 30 minutes.  Patient has renal impairment and may not be metabolizing morphine well.  Staff reports seeing myoclonus  Agitation: Schedule Ativan 1 mg every 4 hours IV and 2 mg every 2 hours as needed for breakthrough agitation  Goals of Care and Additional Recommendations:  Limitations on Scope of Treatment: Full Comfort Care, No Surgical Procedures and No Tracheostomy  Code Status:    Code Status Orders  (From admission, onward)         Start     Ordered   02/01/19 1656  DNR (Do not attempt resuscitation)  Continuous    Question Answer Comment  In the event of cardiac or respiratory ARREST Do not call a "code blue"   In the event of cardiac or respiratory ARREST Do not perform Intubation, CPR, defibrillation or ACLS   In the event of cardiac or respiratory ARREST Use medication by any route, position, wound care, and other measures to relive pain and suffering. May use oxygen, suction and manual treatment of airway obstruction as needed for comfort.   Comments No CPR/ defib      02/01/19 1656        Code Status History    Date Active Date Inactive Code Status Order ID Comments User Context   01/19/2019 0807 02/01/2019 1656 Partial Code [id number]  Arnell Asal, NP ED   01/11/2019 0732 01/18/2019 0807 Full Code [id number]  Arnell Asal, NP ED   08/31/2017 2343 09/03/2017 1439 Full Code [id number]  Karmen Bongo, MD Inpatient   04/22/2017 1832 04/26/2017 1810 Full Code [id number]  Shon Millet, DO Inpatient   04/30/2015 1637 05/01/2015 1945 Full Code [id number]  [place], Peter M, MD Inpatient   04/26/2015 [phone]  04/30/2015 1637 Full Code [id number]  Nita Sells,  MD Inpatient   04/19/2015 1204 04/23/2015 1757 Full Code [id number]  [name], MD Inpatient   04/16/2015 1334 04/19/2015 1204 Full Code [id number]  Nita Sells, MD ED   03/27/2015 0924 03/28/2015 0341 Full Code [id number]  Greggory Keen, MD HOV       Prognosis:   Hours - Days  Discharge Planning:  Anticipated Hospital Death    Thank you for allowing the Palliative Medicine Team to assist in the care of this patient.   Time In: 0900 Time Out: 0930 Total Time 30 min Prolonged Time Billed  no       Greater than 50%  of this time was spent counseling and coordinating care related to the above assessment and plan.  Dory Horn, NP  Please contact Palliative Medicine Team phone at [phone] for questions and concerns.

## 2019-02-03 NOTE — Progress Notes (Addendum)
NAME:  Eric Dudley, MRN:  [id number], DOB:  1951/04/28, LOS: 57 ADMISSION DATE:  01/22/2019, CONSULTATION DATE:  01/11/2019 REFERRING MD:  Dr. Leonides Schanz, CHIEF COMPLAINT:  AMS/ shock  Brief History   46 yoM with recent URI symptoms, not eating well, drinking well with 3 day hx of dizziness, syncopal episode, and ? vomiting.  Brief arrest vs syncopal episode with EMS with < 60 sec CPR.  In ER, remained hypotensive and agitated, requiring intubation for airway protection.  Required pressors for hypotension despite IVF, found to have AKI, hypokalemia, and LLL PNA.  Started empiric abx.  PCCM to admit.  Past Medical History  Multiple myeloma (dx 2016, in remission on Revlimid), HTN, HLD, non smoker, previous NICM with improved EF on recent TTE to 65%, CAD, hx tonsillar cancer  Significant Hospital Events   1/27 Admit 2/6>> Comfort care initiated with terminal extubation  Consults:  Palliative Care Procedures:  1/27 ETT >> 2/5 1/27 R femoral TL CVL >>  1/27 Foley >>  Significant Diagnostic Tests:  01/08/2019 CTH/ cervical  >> 1. No acute intracranial abnormality. 2. Diffuse bone marrow disease consistent with multiple myeloma.  01/18/2019 CT abd/ pelvis >> 1. Aspiration or pneumonia in the left more than right lung base. 2. Bilateral hydronephrosis and hydroureter above an over distended bladder. 3. Nephrolithiasis and cholelithiasis. 4. Sigmoid epiploic appendagitis of indeterminate chronicity.  2/3 CXR> persistent RLL atelectasis vs infiltrate   Micro Data:  1/27 BCx 2 >> no growth in 24-hour 1/27 urine cx >> no growth 1/27 Resp cx  >> gram-positive rods on prelim. 1/27 MRSA PCR >> negative  Antimicrobials:  1/27 vanc x 1 1/27 flagyl x 1 1/27 aztreonam x 1 1/27>>Azithro >1/31 1/27 ceftriaxone >>1/31  Interim history/subjective:  Morphine gtt had been titrated to 20 mg and patient was still sitting up in bed and restless. Transitioned to Dilaudid gtt at 2.5 mg/ hr on 2/7 ,  with prn ativan Appears comfortable  Objective   Blood pressure (!) 148/75, pulse 95, temperature 97.6 F (36.4 C), temperature source Oral, resp. rate (!) 22, height '5\' 10"'  (1.778 m), weight 66.1 kg, SpO2 92 %.        Intake/Output Summary (Last 24 hours) at 02/03/2019 1157 Last data filed at 02/03/2019 0525 Gross per 24 hour  Intake 100.64 ml  Output 1950 ml  Net -1849.36 ml   Filed Weights   01/31/19 0500 02/01/19 0104 02/01/19 1700  Weight: 61 kg 66.1 kg 66.1 kg   Physical Exam: Gen:      Frail elderly appearing male, in bed, NAD , appears comfortable HEENT: NCAT, dry mucus membranes Lungs:    Bilateral chest excursion, Diminished bilaterally, no accessory muscle use CV:        S1, S2, RRR, NO RMG or click Abd:      Soft, round, ND, NT, , bowel sounds diminished  Ext:    Warm and dry. brisk refill, no edema Skin:      Clean, dry, warm, no rash or lesions  Neuro:  Sedated with dilaudid gtt, does not open eyes to voice, noxious stimuli. Does not follow commands   Resolved Hospital Problem list   Septic Shock secondary to pneumonia AKI  Assessment & Plan:   Acute respiratory failure -LLL PNA s/p abx course, chronic immunosuppression Shock has resolved with current antibiotic regimen. Respiratory culture with few candida tropicalis which likely represents contaminant. With improved clinical status, would not recommend additional infectious work-up this time including deferring bronchoscopy.  S/p 5d course of CAP -CXR shows stable atelectasis vs opacities, possible pulm edema  P:  Terminal extubation 2/6 Hydromorphone gtt for comfort as morphine titrated to 20 mg/ hr  did not provide comfort for the patient PRN ativan and robinul available  Appreciate Palliative care expertise and assistance  Acute encephalopathy  -sedation related vs ICU Delirium  -CTH/Cervical neg P:  Comfort care Hydromorphone gtt,  PRN ativan   S/p PEA Arrest -with elevated trops, likely demand  ischemia Prior cath 2016 with minimal CAD P: - Full Comfort care, DNR  Severe electrolyte abnormalities   Hypokalemia, hypophosphatemia, hypomagnesemia  -hypernatremia, resolved  P: Comfort Care No further labs  Multiple myeloma - in remission P:  Comfort care  Hx HTN, HLD P:  Comfort care Hydromorphone gtt , PRN ativan   Spoke with family ( wife and son)  at bedside. Pt better since transition to dilaudid gtt. Appears comfortable. They are relieved he appears to be at peace.Last BP ( 5 am 02/03/2019)) was in the 528'U systolic.Questions answered and support provided.   Best practice:  Diet: NPO, comfort care  Pain/Anxiety/Delirium protocol (if indicated):  ativan, hydromorphone  VAP protocol (if indicated): no DVT prophylaxis: comfort care GI prophylaxis: comfort care Glucose control: comfort care Mobility: BR Code Status:DNR, comfort care Family Communication: son and wife Disposition: Comfort care in 6 N  Labs   CBC: Recent Labs  Lab 01/28/19 0401 01/29/19 0410 01/30/19 0308 01/31/19 0500 02/01/19 0451  WBC 4.4 4.3 3.3* 5.5 5.3  NEUTROABS  --  2.6 1.7  --   --   HGB 12.0* 12.3* 11.4* 12.5* 11.2*  HCT 37.0* 38.1* 35.3* 39.5 35.0*  MCV 101.6* 103.5* 103.2* 103.4* 103.9*  PLT 90* 108* 98* 134* 132    Basic Metabolic Panel: Recent Labs  Lab 01/29/19 0410 01/29/19 1617 01/30/19 0308 01/30/19 1022 01/30/19 1706 01/31/19 0500 01/31/19 1609 02/01/19 0451  NA 153* 147* 145  --   --  134*  --  136  K 3.2* 3.8 2.9*  --   --  3.6  --  3.6  CL 123* 119* 119*  --   --  109  --  110  CO2 22 20* 20*  --   --  15*  --  18*  GLUCOSE 99 128* 137*  --   --  150*  --  136*  BUN '10 12 14  ' --   --  17  --  28*  CREATININE 1.30* 1.34* 1.29*  --   --  1.61*  --  1.99*  CALCIUM 8.9 8.8* 8.5*  --   --  8.5*  --  8.6*  MG 1.7  --  1.6* 1.8 1.6* 1.7 2.2 2.2  PHOS  --   --   --  1.8* 3.5 4.0 4.1 4.7*   GFR: Estimated Creatinine Clearance: 32.8 mL/min (A) (by C-G formula  based on SCr of 1.99 mg/dL (H)). Recent Labs  Lab 01/29/19 0410 01/30/19 0308 01/31/19 0500 02/01/19 0451  WBC 4.3 3.3* 5.5 5.3    Liver Function Tests: Recent Labs  Lab 01/27/19 2032 01/28/19 0401  ALBUMIN 2.2* 2.1*   No results for input(s): LIPASE, AMYLASE in the last 168 hours. No results for input(s): AMMONIA in the last 168 hours.  ABG    Component Value Date/Time   PHART 7.301 (L) 01/29/2019 0905   PCO2ART 43.3 01/29/2019 0905   PO2ART 134 (H) 01/29/2019 0905   HCO3 20.7 01/29/2019 0905   TCO2 19 (  L) 01/25/2019 1552   ACIDBASEDEF 4.6 (H) 01/29/2019 0905   O2SAT 98.4 01/29/2019 0905     Coagulation Profile: No results for input(s): INR, PROTIME in the last 168 hours.  Cardiac Enzymes: No results for input(s): CKTOTAL, CKMB, CKMBINDEX, TROPONINI in the last 168 hours.  HbA1C: Hgb A1c MFr Bld  Date/Time Value Ref Range Status  01/28/2019 07:01 AM 5.4 4.8 - 5.6 % Final    Comment:    (NOTE) Pre diabetes:          5.7%-6.4% Diabetes:              >6.4% Glycemic control for   <7.0% adults with diabetes     CBG: Recent Labs  Lab 01/31/19 2324 02/01/19 0340 02/01/19 0808 02/01/19 1102 02/01/19 1522  GLUCAP 129* 124* 122* 119* 115*    Magdalen Spatz, AGACNP-BC [place] Pager # [phone] After 4 pm please call [phone] 02/03/2019, 11:57 AM

## 2019-02-03 NOTE — Progress Notes (Signed)
Patient seen as they were moving him from 12M to 6N. No Family available at that time. Morphine infusion was at 7mg /hr and he appeared comfortable. Will touch base with family in AM.   Lane Hacker, DO Palliative Medicine

## 2019-02-03 NOTE — Progress Notes (Signed)
Morphine 71ml wasted. Towanda Octave witnessed waste.

## 2019-02-04 DIAGNOSIS — R451 Restlessness and agitation: Secondary | ICD-10-CM

## 2019-02-04 NOTE — Progress Notes (Signed)
   Patient seen, chart reviewed.  No family at the bedside.  Patient is transitioning towards end-of-life.  Staffed with bedside RN.  Patient much more comfortable on Dilaudid continuous infusion.  No boluses noted overnight as well as no PRN Ativan.  He has been less agitated and no reports of myoclonus.  Anticipate hospital death  Prognosis hours to days  Plan: Continue Dilaudid continuous infusion at 2.5 mg and 2 mg every 30 minutes as needed for pain or breathlessness.  Continue to monitor and titrate for effect Continue Ativan 1 mg every 4 hours on a scheduled basis as well as every 2 as needed breakthrough dosing.  Continue to monitor for need to uptitrate if agitation develops No upper airway secretions at this point.  Robinul PRN on board  Thank you, Romona Curls, NP Time spent: 20 minutes Greater than 50% of visit was spent in counseling and coordination of care

## 2019-02-04 NOTE — Progress Notes (Signed)
Pt's family notified the nurse patient seemed agitated. PRN bolus administered.

## 2019-02-04 NOTE — Progress Notes (Signed)
PROGRESS NOTE    Eric Dudley  THY:[id number] DOB: [birth year] DOA: 12/28/2018 PCP: [name], MD  Brief Narrative: 68 year-old male with history of multiple myeloma, CHF, history of tonsillar cancer, CAD, hypertension, dyslipidemia was admitted following a PEA arrest, received CPR by EMS, was intubated in the emergency room and treated with pressors for septic shock, found to have acute kidney injury, hypokalemia and left lower lobe pneumonia -Further discussions were held with patient's wife and son by critical care team who reported that he would not want mechanical ventilation, decision was made for comfort care and terminal extubation was performed on 2/6 palliative care was also consulted. -Transferred from PCCM to [facility] service today 2/8  Assessment & Plan:   PEA arrest/septic shock -The background of chronic immunosuppression, history of multiple myeloma -Status post terminal extubation 2/6 -Currently on a Dilaudid drip for comfort -Continue PRN Ativan and Robinul -Appreciate palliative care input  Metabolic encephalopathy -Now comfort care  Multiple myeloma  Severe electrolyte abnormalities -no Further labs, comfort care  History of CAD  DVT prophylaxis: Comfort care Code Status: DNR Family Communication: No family at bedside Disposition Plan: Anticipate hospital demise  Consultants:   PCCM, palliative care   Procedures:   Antimicrobials:    Subjective: -Moans, mumbles, otherwise resting comfortably  Objective: Vitals:   02/02/19 1700 02/02/19 1725 02/03/19 0522 02/04/19 0556  BP:  132/76 (!) 148/75 (!) 152/86  Pulse: 87 87 95 98  Resp: (!) 24  (!) 22 17  Temp:   97.6 F (36.4 C) 97.7 F (36.5 C)  TempSrc:   Oral Axillary  SpO2: 100% 95% 92% 100%  Weight:      Height:        Intake/Output Summary (Last 24 hours) at 02/04/2019 1338 Last data filed at 02/04/2019 0824 Gross per 24 hour  Intake 53.75 ml  Output 1250 ml  Net -1196.25 ml    Filed Weights   01/31/19 0500 02/01/19 0104 02/01/19 1700  Weight: 61 kg 66.1 kg 66.1 kg    Examination:  General exam: Poorly responsive chronically ill-appearing male, sedated on a Dilaudid drip Respiratory system: Poor air movement, few scattered rhonchi cardiovascular system: S1 & S2 heard, RRR.  Gastrointestinal system: Abdomen is nondistended, soft and nontender. Central nervous system: Alert and oriented. No focal neurological deficits. Extremities: No edema Skin: No rashes, lesions or ulcers Psychiatry: Unable to assess    Data Reviewed:   CBC: Recent Labs  Lab 01/29/19 0410 01/30/19 0308 01/31/19 0500 02/01/19 0451  WBC 4.3 3.3* 5.5 5.3  NEUTROABS 2.6 1.7  --   --   HGB 12.3* 11.4* 12.5* 11.2*  HCT 38.1* 35.3* 39.5 35.0*  MCV 103.5* 103.2* 103.4* 103.9*  PLT 108* 98* 134* 282   Basic Metabolic Panel: Recent Labs  Lab 01/29/19 0410 01/29/19 1617 01/30/19 0308 01/30/19 1022 01/30/19 1706 01/31/19 0500 01/31/19 1609 02/01/19 0451  NA 153* 147* 145  --   --  134*  --  136  K 3.2* 3.8 2.9*  --   --  3.6  --  3.6  CL 123* 119* 119*  --   --  109  --  110  CO2 22 20* 20*  --   --  15*  --  18*  GLUCOSE 99 128* 137*  --   --  150*  --  136*  BUN '10 12 14  ' --   --  17  --  28*  CREATININE 1.30* 1.34* 1.29*  --   --  1.61*  --  1.99*  CALCIUM 8.9 8.8* 8.5*  --   --  8.5*  --  8.6*  MG 1.7  --  1.6* 1.8 1.6* 1.7 2.2 2.2  PHOS  --   --   --  1.8* 3.5 4.0 4.1 4.7*   GFR: Estimated Creatinine Clearance: 32.8 mL/min (A) (by C-G formula based on SCr of 1.99 mg/dL (H)). Liver Function Tests: No results for input(s): AST, ALT, ALKPHOS, BILITOT, PROT, ALBUMIN in the last 168 hours. No results for input(s): LIPASE, AMYLASE in the last 168 hours. No results for input(s): AMMONIA in the last 168 hours. Coagulation Profile: No results for input(s): INR, PROTIME in the last 168 hours. Cardiac Enzymes: No results for input(s): CKTOTAL, CKMB, CKMBINDEX, TROPONINI in  the last 168 hours. BNP (last 3 results) No results for input(s): PROBNP in the last 8760 hours. HbA1C: No results for input(s): HGBA1C in the last 72 hours. CBG: Recent Labs  Lab 01/31/19 2324 02/01/19 0340 02/01/19 0808 02/01/19 1102 02/01/19 1522  GLUCAP 129* 124* 122* 119* 115*   Lipid Profile: No results for input(s): CHOL, HDL, LDLCALC, TRIG, CHOLHDL, LDLDIRECT in the last 72 hours. Thyroid Function Tests: No results for input(s): TSH, T4TOTAL, FREET4, T3FREE, THYROIDAB in the last 72 hours. Anemia Panel: No results for input(s): VITAMINB12, FOLATE, FERRITIN, TIBC, IRON, RETICCTPCT in the last 72 hours. Urine analysis:    Component Value Date/Time   COLORURINE COLORLESS (A) 01/14/2019 0659   APPEARANCEUR CLEAR 01/01/2019 0659   LABSPEC 1.003 (L) 01/18/2019 0659   PHURINE 7.0 01/21/2019 0659   GLUCOSEU 150 (A) 12/28/2018 0659   HGBUR LARGE (A) 01/08/2019 0659   BILIRUBINUR NEGATIVE 01/27/2019 [place] 01/03/2019 0659   PROTEINUR NEGATIVE 01/07/2019 0659   UROBILINOGEN 0.2 04/16/2015 1130   NITRITE NEGATIVE 01/27/2019 0659   LEUKOCYTESUR NEGATIVE 01/11/2019 0659   Sepsis Labs: '@LABRCNTIP' (procalcitonin:4,lacticidven:4)  )No results found for this or any previous visit (from the past 240 hour(s)).       Radiology Studies: No results found.      Scheduled Meds: . chlorhexidine  15 mL Mouth Rinse BID  . LORazepam  1 mg Intravenous Q4H  . mouth rinse  15 mL Mouth Rinse q12n4p   Continuous Infusions: . sodium chloride 250 mL (02/03/19 1817)  . HYDROmorphone 2.5 mg/hr (02/04/19 0553)     LOS: 12 days    Time spent: 77mn    PDomenic Polite MD [facility]  02/04/2019, 1:38 PM

## 2019-02-05 DIAGNOSIS — I959 Hypotension, unspecified: Secondary | ICD-10-CM

## 2019-02-05 NOTE — Progress Notes (Signed)
  Patient seen, chart reviewed.  No family at the bedside.  Staffed with bedside RN who informs me that patient has had family into visit  including his wife.  Patient will still open his eyes to voice and light touch but is nonverbal.  He is no longer able to take anything by mouth  No tachypnea or nonverbal signs and symptoms of pain such as grimacing noted.  No secretions.  He is beginning to mottle to his knees and feet  Plan Continue with Dilaudid continuous infusion and as needed.  Monitor and titrate for effect Continue with scheduled Ativan Robinul available as needed Anticipate hospital death likely within the next 24 hours  Thank you, Romona Curls, NP Time spent: 15 minutes Greater than 50% of this time was spent in counseling and coordination of care

## 2019-02-05 NOTE — Progress Notes (Signed)
Patient seen and examined, currently poorly responsive and not in any distress on a Dilaudid drip -Vital signs stable, palliative care following -Anticipate hospital demise  Domenic Polite, MD

## 2019-02-06 NOTE — Progress Notes (Signed)
Pt resting comfortably with wife at bedside, music and aromatherapy.

## 2019-02-06 NOTE — Progress Notes (Signed)
   02/06/19 1200  Clinical Encounter Type  Visited With Patient  Visit Type Follow-up;Patient actively dying   Pt's spouse saw me in hallway as she was leaving, spoke w/ me.  She was leaving for now, states that he is quiet and peaceful and that is how she wants to remember him.  She may return tomorrow if he is still living.  She repeatedly thanked me for being present.  Myra Gianotti resident

## 2019-02-06 NOTE — Progress Notes (Signed)
Pt appears comfortable at present, has Ativan scheduled, repositioned and comforted.

## 2019-02-06 NOTE — Progress Notes (Signed)
Pt resting comfortably today, turned and cleaned and extensive oral care done.  Wife was in visiting earlier, we reviewed funeral home [employer]).

## 2019-02-06 NOTE — Progress Notes (Signed)
   02/06/19 1100  Clinical Encounter Type  Visited With Family;Patient not available  Visit Type Initial;Spiritual support;Psychological support;Social support;Patient actively dying  Referral From Nurse;Other (Comment) ((charge RN))  Spiritual Encounters  Spiritual Needs Grief support;Emotional  Stress Factors  Patient Stress Factors Not reviewed  Family Stress Factors Major life changes   Met w/ pt's spouse, Nellie, at recommendation of charge RN.  Pt resting, but did tell him at end of visit that I am a chaplain and spoke w/ his wife, conveyed that I am praying for a peaceful passing for him.  W/ spouse:  Listened to life story, met ~42 yrs ago, married 37 years.  Empathetic listening to anticipatory grief, other experiences of loss, pt's cancer hx.  Chaplain remains available.  Myra Gianotti resident, [phone]

## 2019-02-06 NOTE — Progress Notes (Signed)
Pt seen, resting comfortably on Dilaudid gtt, Palliative team following -no family at bedside -anticipate Hospital demise, if vitals remain stable may need to consider [facility]  Domenic Polite, MD

## 2019-02-07 NOTE — Progress Notes (Signed)
7.5 ml of Dilaudid infusion wasted in stericycle with Rowe Clack, RN.  Eric Dudley

## 2019-02-07 NOTE — Progress Notes (Signed)
Patient comfortable, scheduled Ativan administered per order, repositioned and continued instrumental music.

## 2019-02-07 NOTE — Progress Notes (Signed)
Pt seen, unresponsive, appears comfortable on a Dilaudid drip at 2.5 mg/h, scheduled Ativan and PRN Dilaudid, Ativan, Robinul -Vitals stable  Domenic Polite, MD

## 2019-02-08 NOTE — Plan of Care (Signed)
  Problem: Pain Managment: Goal: General experience of comfort will improve Outcome: Progressing   Problem: Pain Management: Goal: Satisfaction with pain management regimen will improve Outcome: Progressing   

## 2019-02-08 NOTE — Progress Notes (Addendum)
PROGRESS NOTE    Eric Dudley  ZDG:[id number] DOB: March 27, 1951 DOA: 01/16/2019 PCP: [name], MD  Brief Narrative: 68 year-old male with history of multiple myeloma, CHF, history of tonsillar cancer, CAD, hypertension, dyslipidemia was admitted following a PEA arrest, received CPR by EMS, was intubated in the emergency room and treated with pressors for septic shock, found to have acute kidney injury, hypokalemia and left lower lobe pneumonia -Further discussions were held with patient's wife and son by critical care team who reported that he would not want mechanical ventilation, decision was made for comfort care and terminal extubation was performed on 2/6 palliative care was also consulted. -Transferred from PCCM to [facility] service 2/8 -Currently unresponsive on a Dilaudid drip, hospital demise anticipated  Assessment & Plan:   PEA arrest/septic shock -in the background of chronic immunosuppression, history of multiple myeloma -Status post terminal extubation 2/6 -Currently on a Dilaudid drip for comfort, titrate as needed -Also continue PRN Ativan and Robinul -Palliative medicine following, appreciate input -Anticipate hospital demise in few days  Metabolic encephalopathy -Now comfort care  Multiple myeloma -Now comfort care  History of tonsillar CA -Comfort care  Severe electrolyte abnormalities -Now comfort care  History of CAD  DVT prophylaxis: Comfort care Code Status: DNR Family Communication: No family at bedside Disposition Plan: Anticipate hospital demise  Consultants:   PCCM, palliative care   Procedures:   Antimicrobials:    Subjective: -No events overnight, mildly hypoxic now, remains unresponsive and comfortable on a Dilaudid drip  Objective: Vitals:   02/05/19 0501 02/06/19 0509 02/07/19 0547 02/08/19 0523  BP: (!) 142/67 118/66 119/62 104/62  Pulse: 87 82 84 98  Resp: '14 10 12 12  ' Temp: 98.8 F (37.1 C) 98.3 F (36.8 C) 98 F  (36.7 C) 98.4 F (36.9 C)  TempSrc: Axillary Oral Oral Oral  SpO2: 96% 94% 93% (!) 83%  Weight:      Height:        Intake/Output Summary (Last 24 hours) at 02/08/2019 1311 Last data filed at 02/08/2019 9518 Gross per 24 hour  Intake 0 ml  Output 800 ml  Net -800 ml   Filed Weights   01/31/19 0500 02/01/19 0104 02/01/19 1700  Weight: 61 kg 66.1 kg 66.1 kg    Examination:  Gen: Unresponsive, resting comfortably on a Dilaudid drip chronically ill-appearing,  Lungs: Shallow breaths CVS: S1-S2/regular rate and rhythm Abd: Soft Extremities: No edema    Data Reviewed:   CBC: No results for input(s): WBC, NEUTROABS, HGB, HCT, MCV, PLT in the last 168 hours. Basic Metabolic Panel: No results for input(s): NA, K, CL, CO2, GLUCOSE, BUN, CREATININE, CALCIUM, MG, PHOS in the last 168 hours. GFR: Estimated Creatinine Clearance: 32.8 mL/min (A) (by C-G formula based on SCr of 1.99 mg/dL (H)). Liver Function Tests: No results for input(s): AST, ALT, ALKPHOS, BILITOT, PROT, ALBUMIN in the last 168 hours. No results for input(s): LIPASE, AMYLASE in the last 168 hours. No results for input(s): AMMONIA in the last 168 hours. Coagulation Profile: No results for input(s): INR, PROTIME in the last 168 hours. Cardiac Enzymes: No results for input(s): CKTOTAL, CKMB, CKMBINDEX, TROPONINI in the last 168 hours. BNP (last 3 results) No results for input(s): PROBNP in the last 8760 hours. HbA1C: No results for input(s): HGBA1C in the last 72 hours. CBG: Recent Labs  Lab 02/01/19 1522  GLUCAP 115*   Lipid Profile: No results for input(s): CHOL, HDL, LDLCALC, TRIG, CHOLHDL, LDLDIRECT in the last 72 hours.  Thyroid Function Tests: No results for input(s): TSH, T4TOTAL, FREET4, T3FREE, THYROIDAB in the last 72 hours. Anemia Panel: No results for input(s): VITAMINB12, FOLATE, FERRITIN, TIBC, IRON, RETICCTPCT in the last 72 hours. Urine analysis:    Component Value Date/Time   COLORURINE  COLORLESS (A) 01/01/2019 0659   APPEARANCEUR CLEAR 01/21/2019 0659   LABSPEC 1.003 (L) 01/05/2019 0659   PHURINE 7.0 01/18/2019 0659   GLUCOSEU 150 (A) 01/03/2019 0659   HGBUR LARGE (A) 01/11/2019 0659   BILIRUBINUR NEGATIVE 01/07/2019 0659   KETONESUR NEGATIVE 01/18/2019 0659   PROTEINUR NEGATIVE 01/22/2019 0659   UROBILINOGEN 0.2 04/16/2015 1130   NITRITE NEGATIVE 01/19/2019 0659   LEUKOCYTESUR NEGATIVE 01/27/2019 0659   Sepsis Labs: '@LABRCNTIP' (procalcitonin:4,lacticidven:4)  )No results found for this or any previous visit (from the past 240 hour(s)).       Radiology Studies: No results found.      Scheduled Meds: . chlorhexidine  15 mL Mouth Rinse BID  . LORazepam  1 mg Intravenous Q4H  . mouth rinse  15 mL Mouth Rinse q12n4p   Continuous Infusions: . sodium chloride 250 mL (02/08/19 0239)  . HYDROmorphone 2.5 mg/hr (02/08/19 0920)     LOS: 16 days    Time spent: 34mn    PDomenic Polite MD [facility]  02/08/2019, 1:11 PM

## 2019-02-08 NOTE — Consult Note (Signed)
[place] Nurse wound follow up Wound type:DTPI to upper lip resolved.  No scabbed areas.  Patient is alone in the room and resting comfortably.  No further WOC needs at this time.  Will not follow at this time.  Please re-consult if needed.  Domenic Moras MSN, RN, FNP-BC CWON Wound, Ostomy, Continence Nurse Pager [phone]

## 2019-02-08 NOTE — Progress Notes (Signed)
Wasted 32.5 ml of Dilaudid infusion with Willia Craze, RN when changing bags.

## 2019-02-09 DIAGNOSIS — N171 Acute kidney failure with acute cortical necrosis: Secondary | ICD-10-CM

## 2019-02-09 NOTE — Progress Notes (Signed)
15 mLs of dilaudid wasted in the stericycle container with Patti.

## 2019-02-09 NOTE — Progress Notes (Signed)
PROGRESS NOTE    [name]  ZSM:[id number] DOB: 1951-01-18 DOA: 01/16/2019 PCP: [name], MD   Brief Narrative:  The patient is a 68 year-old male with history of multiple myeloma, CHF, history of tonsillar cancer, CAD, hypertension, dyslipidemia was admitted following a PEA arrest, received CPR by EMS, was intubated in the emergency room and treated with pressors for septic shock, found to have acute kidney injury, hypokalemia and left lower lobe pneumonia.  -Further discussions were held with patient's wife and son by critical care team who reported that he would not want mechanical ventilation, decision was made for comfort care and terminal extubation was performed on 2/6 palliative care was also consulted. [facility]. Patient is Currently unresponsive on a Dilaudid drip and is a high risk for decompensation with anticipated hospital death pending.   Assessment & Plan:   Active Problems:   Community acquired pneumonia   Shock ([place])   Protein-calorie malnutrition, severe   Acute encephalopathy   Pressure injury of skin   Acute renal failure ([place])   Palliative care by specialist   Generalized pain   Agitation  PEA arrest/septic shock -in the background of chronic immunosuppression, history of multiple myeloma -Status post terminal extubation 2/6 -Currently on a Dilaudid drip for comfort, titrate as needed for Comfort -Also continue PRN Ativan and Robinul -Palliative medicine following, appreciate input -Anticipate hospital demise and death in few days  Metabolic Encephalopathy -Now comfort care  Multiple Myeloma -Now comfort care  History of tonsillar CA -Comfort care  Severe electrolyte abnormalities -Now comfort care so will not check labwork  History of CAD  -Patient is now Comfort Care  DVT prophylaxis: None as patient is comfort care Code Status: DO NOT RESUSCITATE Family Communication: No family  present at bedside  Disposition Plan: Anticipated Hospital Death   Consultants:   PCCM  Palliative Care  Procedures:  None   Antimicrobials:  Anti-infectives (From admission, onward)   Start     Dose/Rate Route Frequency Ordered Stop   01/25/19 0300  vancomycin (VANCOCIN) IVPB 1000 mg/200 mL premix  Status:  Discontinued     1,000 mg 200 mL/hr over 60 Minutes Intravenous Every 48 hours 12/28/2018 0352 01/07/2019 0738   01/20/2019 0900  cefTRIAXone (ROCEPHIN) 2 g in sodium chloride 0.9 % 100 mL IVPB     2 g 200 mL/hr over 30 Minutes Intravenous Every 24 hours 01/10/2019 0858 01/27/19 1034   01/13/2019 0800  azithromycin (ZITHROMAX) 500 mg in sodium chloride 0.9 % 250 mL IVPB     500 mg 250 mL/hr over 60 Minutes Intravenous Every 24 hours 01/24/2019 0738 01/27/19 0936   01/03/2019 0245  aztreonam (AZACTAM) 2 g in sodium chloride 0.9 % 100 mL IVPB     2 g 200 mL/hr over 30 Minutes Intravenous  Once 01/12/2019 0241 01/03/2019 0434   01/24/2019 0245  metroNIDAZOLE (FLAGYL) IVPB 500 mg  Status:  Discontinued     500 mg 100 mL/hr over 60 Minutes Intravenous Every 8 hours 01/01/2019 0241 01/24/2019 0738   01/04/2019 0245  vancomycin (VANCOCIN) IVPB 1000 mg/200 mL premix     1,000 mg 200 mL/hr over 60 Minutes Intravenous  Once 01/05/2019 0241 01/17/2019 0406     Subjective: No family present at bedside and remains comfortable. Has some labored breathing. Remains on Dilaudid drip and unable to provide a subjective history due to unresponsiveness.   Objective: Vitals:   02/06/19 0509 02/07/19 0547 02/08/19 0523 02/09/19  0519  BP: 118/66 119/62 104/62 (!) 81/47  Pulse: 82 84 98 90  Resp: '10 12 12 15  ' Temp: 98.3 F (36.8 C) 98 F (36.7 C) 98.4 F (36.9 C) (!) 97.5 F (36.4 C)  TempSrc: Oral Oral Oral Oral  SpO2: 94% 93% (!) 83%   Weight:      Height:        Intake/Output Summary (Last 24 hours) at 02/09/2019 1305 Last data filed at 02/09/2019 0858 Gross per 24 hour  Intake 0 ml  Output 800 ml  Net  -800 ml   Filed Weights   01/31/19 0500 02/01/19 0104 02/01/19 1700  Weight: 61 kg 66.1 kg 66.1 kg   Examination: Physical Exam:  General: Unresponsive, resting comfortably on a Dilaudid drip chronically ill-appearing,  Lungs: Short Shallow breathing with some mild rhonchi CVS: RRR but slightly on the faster side Abd: Firm and distended slightly has some grimacing on palpation Extremities: No edema  Data Reviewed: I have personally reviewed following labs and imaging studies  CBC: No results for input(s): WBC, NEUTROABS, HGB, HCT, MCV, PLT in the last 168 hours. Basic Metabolic Panel: No results for input(s): NA, K, CL, CO2, GLUCOSE, BUN, CREATININE, CALCIUM, MG, PHOS in the last 168 hours. GFR: Estimated Creatinine Clearance: 32.8 mL/min (A) (by C-G formula based on SCr of 1.99 mg/dL (H)). Liver Function Tests: No results for input(s): AST, ALT, ALKPHOS, BILITOT, PROT, ALBUMIN in the last 168 hours. No results for input(s): LIPASE, AMYLASE in the last 168 hours. No results for input(s): AMMONIA in the last 168 hours. Coagulation Profile: No results for input(s): INR, PROTIME in the last 168 hours. Cardiac Enzymes: No results for input(s): CKTOTAL, CKMB, CKMBINDEX, TROPONINI in the last 168 hours. BNP (last 3 results) No results for input(s): PROBNP in the last 8760 hours. HbA1C: No results for input(s): HGBA1C in the last 72 hours. CBG: No results for input(s): GLUCAP in the last 168 hours. Lipid Profile: No results for input(s): CHOL, HDL, LDLCALC, TRIG, CHOLHDL, LDLDIRECT in the last 72 hours. Thyroid Function Tests: No results for input(s): TSH, T4TOTAL, FREET4, T3FREE, THYROIDAB in the last 72 hours. Anemia Panel: No results for input(s): VITAMINB12, FOLATE, FERRITIN, TIBC, IRON, RETICCTPCT in the last 72 hours. Sepsis Labs: No results for input(s): PROCALCITON, LATICACIDVEN in the last 168 hours.  No results found for this or any previous visit (from the past 240  hour(s)).   RN Pressure Injury Documentation: Pressure Injury 01/27/19 Deep Tissue Injury - Purple or maroon localized area of discolored intact skin or blood-filled blister due to damage of underlying soft tissue from pressure and/or shear. upper middle lip  (Active)  01/27/19   Location: Lip  Location Orientation: Mid  Staging: Deep Tissue Injury - Purple or maroon localized area of discolored intact skin or blood-filled blister due to damage of underlying soft tissue from pressure and/or shear.  Wound Description (Comments): upper middle lip   Present on Admission: No   Estimated body mass index is 20.91 kg/m as calculated from the following:   Height as of this encounter: '5\' 10"'  (1.778 m).   Weight as of this encounter: 66.1 kg.  Malnutrition Type:  Nutrition Problem: Severe Malnutrition Etiology: chronic illness(Multiple Myeloma)   Malnutrition Characteristics:  Signs/Symptoms: severe fat depletion, severe muscle depletion   Nutrition Interventions:  Interventions: Tube feeding   Radiology Studies: No results found.  Scheduled Meds: . chlorhexidine  15 mL Mouth Rinse BID  . LORazepam  1 mg Intravenous Q4H  .  mouth rinse  15 mL Mouth Rinse q12n4p   Continuous Infusions: . sodium chloride 250 mL (02/08/19 0239)  . HYDROmorphone 2.5 mg/hr (02/09/19 0625)    LOS: 17 days   Kerney Elbe, DO [facility] PAGER is on [place]  If 7PM-7AM, please contact night-coverage [email or web address] Password [email or web address] 02/09/2019, 1:05 PM

## 2019-02-09 NOTE — Social Work (Signed)
CSW acknowledging pt remains under comfort care. Will follow for disposition should hospice services or residential hospice become appropriate. Spoke with MD regarding this today as well.  Alexander Mt, [place] Work [phone]

## 2019-02-10 NOTE — Progress Notes (Signed)
PROGRESS NOTE    Eric Dudley  EEF:[id number] DOB: 12/28/1951 DOA: 01/22/2019 PCP: [name], MD   Brief Narrative:  The patient is a 68 year-old male with history of multiple myeloma, CHF, history of tonsillar cancer, CAD, hypertension, dyslipidemia was admitted following a PEA arrest, received CPR by EMS, was intubated in the emergency room and treated with pressors for septic shock, found to have acute kidney injury, hypokalemia and left lower lobe pneumonia.  -Further discussions were held with patient's wife and son by critical care team who reported that he would not want mechanical ventilation, decision was made for comfort care and terminal extubation was performed on 2/6 palliative care was also consulted. [facility]. Patient is Currently unresponsive on a Dilaudid drip and is a high risk for decompensation with anticipated hospital death pending and imminent. Palliative re-evaluated and patient is unstable for transport at this time for Residential Hospice or Home Hospice.    Assessment & Plan:   Active Problems:   Community acquired pneumonia   Shock ([place])   Protein-calorie malnutrition, severe   Acute encephalopathy   Pressure injury of skin   Acute renal failure ([facility])   Palliative care by specialist   Generalized pain   Agitation  PEA arrest/septic shock -in the background of chronic immunosuppression, history of multiple myeloma -Status post terminal extubation 2/6 -Currently on a Dilaudid drip for comfort, titrate as needed for Comfort -Also continue PRN Ativan and Robinul -Palliative medicine following, appreciate input and they re-evaluated and they saw him last week and noticed a change in him as he is no longer to responding to voice by opening his eyes or light touch -Anticipate hospital demise and death within hours to days  Metabolic Encephalopathy -Now comfort care  Multiple Myeloma -Now comfort  care  History of tonsillar CA -Comfort care  Severe electrolyte abnormalities -Now comfort care so will not check labwork  History of CAD  -Patient is now Comfort Care  DVT prophylaxis: None as patient is comfort care Code Status: DO NOT RESUSCITATE Family Communication: No family present at bedside  Disposition Plan: Anticipated Hospital Death is imminent and will not transport to Residental/Home Hospice due to instability for transport  Consultants:   PCCM  Palliative Care  Procedures:  None   Antimicrobials:  Anti-infectives (From admission, onward)   Start     Dose/Rate Route Frequency Ordered Stop   01/25/19 0300  vancomycin (VANCOCIN) IVPB 1000 mg/200 mL premix  Status:  Discontinued     1,000 mg 200 mL/hr over 60 Minutes Intravenous Every 48 hours 01/13/2019 0352 01/26/2019 0738   01/26/2019 0900  cefTRIAXone (ROCEPHIN) 2 g in sodium chloride 0.9 % 100 mL IVPB     2 g 200 mL/hr over 30 Minutes Intravenous Every 24 hours 01/18/2019 0858 01/27/19 1034   12/29/2018 0800  azithromycin (ZITHROMAX) 500 mg in sodium chloride 0.9 % 250 mL IVPB     500 mg 250 mL/hr over 60 Minutes Intravenous Every 24 hours 01/25/2019 0738 01/27/19 0936   01/13/2019 0245  aztreonam (AZACTAM) 2 g in sodium chloride 0.9 % 100 mL IVPB     2 g 200 mL/hr over 30 Minutes Intravenous  Once 01/14/2019 0241 01/26/2019 0434   01/26/2019 0245  metroNIDAZOLE (FLAGYL) IVPB 500 mg  Status:  Discontinued     500 mg 100 mL/hr over 60 Minutes Intravenous Every 8 hours 01/12/2019 0241 01/03/2019 0738   01/15/2019 0245  vancomycin (VANCOCIN) IVPB 1000  mg/200 mL premix     1,000 mg 200 mL/hr over 60 Minutes Intravenous  Once 01/13/2019 0241 01/10/2019 0406     Subjective: Remains comfortable. No acute changes and does not respond. Slightly labored breathing and unable to provide a subjective history due to unresponsiveness.   Objective: Vitals:   02/07/19 0547 02/08/19 0523 02/09/19 0519 02/10/19 0626  BP: 119/62 104/62 (!)  81/47 (!) 74/43  Pulse: 84 98 90 83  Resp: '12 12 15 20  ' Temp: 98 F (36.7 C) 98.4 F (36.9 C) (!) 97.5 F (36.4 C) 99.6 F (37.6 C)  TempSrc: Oral Oral Oral Axillary  SpO2: 93% (!) 83%  90%  Weight:      Height:        Intake/Output Summary (Last 24 hours) at 02/10/2019 1148 Last data filed at 02/10/2019 9030 Gross per 24 hour  Intake 0 ml  Output 250 ml  Net -250 ml   Filed Weights   01/31/19 0500 02/01/19 0104 02/01/19 1700  Weight: 61 kg 66.1 kg 66.1 kg   Examination: Physical Exam:  General: Chronically ill appearing and remains comfortable; Unresponsive  Lungs: Has short shallow breathing but is not tachypenic; Has some rhonchi CVS: RRR Abd: Does not grimace to palpation on abdomen today Extremities: No LE noted   Data Reviewed: I have personally reviewed following labs and imaging studies  CBC: No results for input(s): WBC, NEUTROABS, HGB, HCT, MCV, PLT in the last 168 hours. Basic Metabolic Panel: No results for input(s): NA, K, CL, CO2, GLUCOSE, BUN, CREATININE, CALCIUM, MG, PHOS in the last 168 hours. GFR: Estimated Creatinine Clearance: 32.8 mL/min (A) (by C-G formula based on SCr of 1.99 mg/dL (H)). Liver Function Tests: No results for input(s): AST, ALT, ALKPHOS, BILITOT, PROT, ALBUMIN in the last 168 hours. No results for input(s): LIPASE, AMYLASE in the last 168 hours. No results for input(s): AMMONIA in the last 168 hours. Coagulation Profile: No results for input(s): INR, PROTIME in the last 168 hours. Cardiac Enzymes: No results for input(s): CKTOTAL, CKMB, CKMBINDEX, TROPONINI in the last 168 hours. BNP (last 3 results) No results for input(s): PROBNP in the last 8760 hours. HbA1C: No results for input(s): HGBA1C in the last 72 hours. CBG: No results for input(s): GLUCAP in the last 168 hours. Lipid Profile: No results for input(s): CHOL, HDL, LDLCALC, TRIG, CHOLHDL, LDLDIRECT in the last 72 hours. Thyroid Function Tests: No results for  input(s): TSH, T4TOTAL, FREET4, T3FREE, THYROIDAB in the last 72 hours. Anemia Panel: No results for input(s): VITAMINB12, FOLATE, FERRITIN, TIBC, IRON, RETICCTPCT in the last 72 hours. Sepsis Labs: No results for input(s): PROCALCITON, LATICACIDVEN in the last 168 hours.  No results found for this or any previous visit (from the past 240 hour(s)).   RN Pressure Injury Documentation: Pressure Injury 01/27/19 Deep Tissue Injury - Purple or maroon localized area of discolored intact skin or blood-filled blister due to damage of underlying soft tissue from pressure and/or shear. upper middle lip  (Active)  01/27/19   Location: Lip  Location Orientation: Mid  Staging: Deep Tissue Injury - Purple or maroon localized area of discolored intact skin or blood-filled blister due to damage of underlying soft tissue from pressure and/or shear.  Wound Description (Comments): upper middle lip   Present on Admission: No   Estimated body mass index is 20.91 kg/m as calculated from the following:   Height as of this encounter: '5\' 10"'  (1.778 m).   Weight as of this encounter: 66.1  kg.  Malnutrition Type:  Nutrition Problem: Severe Malnutrition Etiology: chronic illness(Multiple Myeloma)   Malnutrition Characteristics:  Signs/Symptoms: severe fat depletion, severe muscle depletion  Nutrition Interventions:  Interventions: Tube feeding   Radiology Studies: No results found.  Scheduled Meds: . chlorhexidine  15 mL Mouth Rinse BID  . LORazepam  1 mg Intravenous Q4H  . mouth rinse  15 mL Mouth Rinse q12n4p   Continuous Infusions: . sodium chloride 250 mL (02/08/19 0239)  . HYDROmorphone 2.5 mg/hr (02/10/19 0329)    LOS: 18 days   Kerney Elbe, DO [facility] PAGER is on [place]  If 7PM-7AM, please contact night-coverage [email or web address] Password [facility] 02/10/2019, 11:48 AM

## 2019-02-10 NOTE — Progress Notes (Addendum)
  Patient seen, chart reviewed.  No family at the bedside.  Patient is actively dying.  No nonverbal signs and symptoms of distress, no tachypnea, respiratory rate 20.  Secretions well managed No agitation  Patient is no longer responding to voice by opening his eyes or light touch  Anticipate patient passing within hours to days  Anticipate hospital death as pt appears very close to death, unstable for transport  No changes to Dilaudid continuous infusion.  No PRN's noted.  Continue   scheduled Ativan  Thank you, Romona Curls, NP Time spent: 15 minutes Than 50% of time was spent in counseling and coordination of care

## 2019-02-11 ENCOUNTER — Encounter: Payer: Self-pay | Admitting: Internal Medicine

## 2019-02-11 DIAGNOSIS — Z515 Encounter for palliative care: Secondary | ICD-10-CM

## 2019-02-26 NOTE — Progress Notes (Signed)
Nurse administered new bag of Dilaudid.  Wasted 69ml from previous bag in stericylce in med room A, witnessed by Mayra Neer, RN.  Will continue to monitor.

## 2019-02-26 NOTE — Progress Notes (Addendum)
  Patient seen, chart reviewed.  No family at the bedside.  Patient is unresponsive to touch and voice.    Patient has been incontinent of bowel several times last night per bedside RN.  His feet and knees are heavily mottled this morning.  Blood pressure 70/40.  Per chart review see that sch ativan has been held per family request.  No breakthrough dialudid  needed for comfort.  Pulses are not palpable  Plan Continue Dilaudid continuous infusion at 2.5 mg an hour and 2 mg every 30 minutes as needed.  Monitor and titrate for effect however I do feel like patient will likely pass in the next 24 hours.  His respiratory pattern is more irregular, feet and knees are heavily mottled.  We will discontinue scheduled Ativan; continue with PRN Ativan only  He is too unstable to transport to another facility at this point.  Staffed with bedside RN  Thank you, Romona Curls, NP Time spent: 20 minutes Greater than 50 percent of time spent in counseling and coordination of care

## 2019-02-26 NOTE — Progress Notes (Signed)
PROGRESS NOTE    Eric Dudley  NKN:[id number] DOB: 1951/06/14 DOA: 01/20/2019 PCP: [name], MD   Brief Narrative:  The patient is a 68 year-old male with history of multiple myeloma, CHF, history of tonsillar cancer, CAD, hypertension, dyslipidemia was admitted following a PEA arrest, received CPR by EMS, was intubated in the emergency room and treated with pressors for septic shock, found to have acute kidney injury, hypokalemia and left lower lobe pneumonia.  -Further discussions were held with patient's wife and son by critical care team who reported that he would not want mechanical ventilation, decision was made for comfort care and terminal extubation was performed on 2/6 palliative care was also consulted. [facility]. Patient is Currently unresponsive on a Dilaudid drip and is a high risk for decompensation with anticipated hospital death pending and imminent. Palliative re-evaluated on 02/10/2019 and patient is unstable for transport at this time for [facility] or Home Hospice. He is continuing to decline and hospital death is imminent.  Assessment & Plan:   Active Problems:   Community acquired pneumonia   Shock ([place])   Protein-calorie malnutrition, severe   Acute encephalopathy   Pressure injury of skin   Acute renal failure (HCC)   Palliative care by specialist   Generalized pain   Agitation  PEA arrest/septic shock -in the background of chronic immunosuppression, history of multiple myeloma -Status post terminal extubation 2/6 -Currently on a Dilaudid drip for comfort, titrate as needed for Comfort -Also continue PRN Ativan and Robinul -Palliative medicine following, appreciate input and they re-evaluated and they saw him last week and noticed a change in him as he is no longer to responding to voice by opening his eyes or light touch -Anticipate hospital demise and death within hours to days; Appeared to be worse  today.   Metabolic Encephalopathy -Now comfort care  Multiple Myeloma -Now comfort care  History of tonsillar CA -Comfort care  Severe electrolyte abnormalities -Now comfort care so will not check labwork  History of CAD  -Patient is now Comfort Care  DVT prophylaxis: None as patient is comfort care Code Status: DO NOT RESUSCITATE Family Communication: No family present at bedside  Disposition Plan: Anticipated Hospital Death is imminent and will not transport to Residental/Home Hospice due to instability for transport  Consultants:   PCCM  Palliative Care  Procedures:  None   Antimicrobials:  Anti-infectives (From admission, onward)   Start     Dose/Rate Route Frequency Ordered Stop   01/25/19 0300  vancomycin (VANCOCIN) IVPB 1000 mg/200 mL premix  Status:  Discontinued     1,000 mg 200 mL/hr over 60 Minutes Intravenous Every 48 hours 12/28/2018 0352 01/24/2019 0738   01/26/2019 0900  cefTRIAXone (ROCEPHIN) 2 g in sodium chloride 0.9 % 100 mL IVPB     2 g 200 mL/hr over 30 Minutes Intravenous Every 24 hours 01/15/2019 0858 01/27/19 1034   01/10/2019 0800  azithromycin (ZITHROMAX) 500 mg in sodium chloride 0.9 % 250 mL IVPB     500 mg 250 mL/hr over 60 Minutes Intravenous Every 24 hours 01/05/2019 0738 01/27/19 0936   12/31/2018 0245  aztreonam (AZACTAM) 2 g in sodium chloride 0.9 % 100 mL IVPB     2 g 200 mL/hr over 30 Minutes Intravenous  Once 01/27/2019 0241 12/30/2018 0434   01/17/2019 0245  metroNIDAZOLE (FLAGYL) IVPB 500 mg  Status:  Discontinued     500 mg 100 mL/hr over 60 Minutes Intravenous  Every 8 hours 01/07/2019 0241 01/17/2019 0738   01/25/2019 0245  vancomycin (VANCOCIN) IVPB 1000 mg/200 mL premix     1,000 mg 200 mL/hr over 60 Minutes Intravenous  Once 01/24/2019 0241 01/02/2019 0406     Subjective: No acute changes and does not respond to verbal commands or physical stimuli anymore.  Has slightly labored and shallow breathing more pronounced than yesterday.  No other  concerns or complaints at this time and no family at bedside.  Hospital death is imminent  Objective: Vitals:   02/08/19 0523 02/09/19 0519 02/10/19 0626 Feb 18, 2019 0446  BP: 104/62 (!) 81/47 (!) 74/43 (!) 70/40  Pulse: 98 90 83 77  Resp: '12 15 20 18  ' Temp: 98.4 F (36.9 C) (!) 97.5 F (36.4 C) 99.6 F (37.6 C) 98.2 F (36.8 C)  TempSrc: Oral Oral Axillary Oral  SpO2: (!) 83%  90%   Weight:      Height:        Intake/Output Summary (Last 24 hours) at 02/18/19 1146 Last data filed at 02/18/19 0300 Gross per 24 hour  Intake 0 ml  Output 50 ml  Net -50 ml   Filed Weights   01/31/19 0500 02/01/19 0104 02/01/19 1700  Weight: 61 kg 66.1 kg 66.1 kg   Examination: Physical Exam:  General:  Thin chronically ill-appearing Caucasian male who does not respond to verbal or physical stimuli and is nonresponsive due to current condition Lungs:  Diminished with some rhonchorous sounds with short shallow inspiratory breathing and appears mildly later CVS:  Regular rate and rhythm. Abd: Slightly firm.  Bowel sounds are present Extremities:  No lower extremity edema noted  Data Reviewed: I have personally reviewed following labs and imaging studies  CBC: No results for input(s): WBC, NEUTROABS, HGB, HCT, MCV, PLT in the last 168 hours. Basic Metabolic Panel: No results for input(s): NA, K, CL, CO2, GLUCOSE, BUN, CREATININE, CALCIUM, MG, PHOS in the last 168 hours. GFR: Estimated Creatinine Clearance: 32.8 mL/min (A) (by C-G formula based on SCr of 1.99 mg/dL (H)). Liver Function Tests: No results for input(s): AST, ALT, ALKPHOS, BILITOT, PROT, ALBUMIN in the last 168 hours. No results for input(s): LIPASE, AMYLASE in the last 168 hours. No results for input(s): AMMONIA in the last 168 hours. Coagulation Profile: No results for input(s): INR, PROTIME in the last 168 hours. Cardiac Enzymes: No results for input(s): CKTOTAL, CKMB, CKMBINDEX, TROPONINI in the last 168 hours. BNP (last  3 results) No results for input(s): PROBNP in the last 8760 hours. HbA1C: No results for input(s): HGBA1C in the last 72 hours. CBG: No results for input(s): GLUCAP in the last 168 hours. Lipid Profile: No results for input(s): CHOL, HDL, LDLCALC, TRIG, CHOLHDL, LDLDIRECT in the last 72 hours. Thyroid Function Tests: No results for input(s): TSH, T4TOTAL, FREET4, T3FREE, THYROIDAB in the last 72 hours. Anemia Panel: No results for input(s): VITAMINB12, FOLATE, FERRITIN, TIBC, IRON, RETICCTPCT in the last 72 hours. Sepsis Labs: No results for input(s): PROCALCITON, LATICACIDVEN in the last 168 hours.  No results found for this or any previous visit (from the past 240 hour(s)).   RN Pressure Injury Documentation: Pressure Injury 01/27/19 Deep Tissue Injury - Purple or maroon localized area of discolored intact skin or blood-filled blister due to damage of underlying soft tissue from pressure and/or shear. upper middle lip  (Active)  01/27/19   Location: Lip  Location Orientation: Mid  Staging: Deep Tissue Injury - Purple or maroon localized area of discolored intact  skin or blood-filled blister due to damage of underlying soft tissue from pressure and/or shear.  Wound Description (Comments): upper middle lip   Present on Admission: No   Estimated body mass index is 20.91 kg/m as calculated from the following:   Height as of this encounter: '5\' 10"'  (1.778 m).   Weight as of this encounter: 66.1 kg.  Malnutrition Type:  Nutrition Problem: Severe Malnutrition Etiology: chronic illness(Multiple Myeloma)   Malnutrition Characteristics:  Signs/Symptoms: severe fat depletion, severe muscle depletion  Nutrition Interventions:  Interventions: Tube feeding   Radiology Studies: No results found.  Scheduled Meds: . chlorhexidine  15 mL Mouth Rinse BID  . mouth rinse  15 mL Mouth Rinse q12n4p   Continuous Infusions: . sodium chloride 250 mL (03-10-2019 0452)  . HYDROmorphone 2.5  mg/hr (10-Mar-2019 1038)    LOS: 19 days   Kerney Elbe, DO [facility] PAGER is on [place]  If 7PM-7AM, please contact night-coverage [email or web address] Password [facility] 03/10/19, 11:46 AM

## 2019-02-26 NOTE — Progress Notes (Signed)
While at patient bedside this nurse and NT noticed respirations beginning to slow.  Over a 7 minute period respirations were spiradic.  Both nurse and NT were present at time of last breath.  Patient passed peacefully.  Mayra Neer, RN verified with Samantha Crimes, RN and pronounced TOD at 04/03/01.  MD and palliative NP were notified via amion.  The patients spouse was notified via telephone and informed there would be no visitors.  Patient will be prepared and taken to morgue.

## 2019-02-26 NOTE — Progress Notes (Signed)
5 mL (50 mg) of hydromorphone wasted in the [employer] in [employer]. Kathleene Hazel, PharmD, was witness.

## 2019-02-26 NOTE — Death Summary Note (Signed)
DEATH SUMMARY   Patient Details  Name: Eric Dudley MRN: [id number] DOB: 06-Nov-1951  Admission/Discharge Information   Admit Date:  2019/02/06  Date of Death: Date of Death: Feb 25, 2019  Time of Death: Time of Death: 03/14/01  Length of Stay: Mar 30, 2023  Referring Physician: [name], MD   Reason(s) for Hospitalization  PEA arrest s/p CPR  Diagnoses  Preliminary cause of death:    Cariopulmonary PEA arrest in the setting of Septic Shock from Pneumonia  Secondary Diagnoses (including complications and co-morbidities):   PEA arrest/septic shock  MetabolicEncephalopathy  MultipleMyeloma  History of tonsillar CA  Severe electrolyte abnormalities  History of CAD   Active Problems:   Community acquired pneumonia   Shock ([place])   Protein-calorie malnutrition, severe   Acute encephalopathy   Pressure injury of skin   Acute renal failure ([place])   Palliative care by specialist   Generalized pain   Agitation   Terminal care  Brief Hospital Course (including significant findings, care, treatment, and services provided and events leading to death)  Eric Dudley is a 68 y.o. year old male with a PMH of multiple myeloma, CHF, history of tonsillar cancer, CAD, hypertension, dyslipidemia and other comorbidities was admitted on 06-Feb-2019 following a PEA arrest, received CPR by EMS, was intubated in the emergency room and treated with pressors for septic shock, found to have acute kidney injury, hypokalemia and left lower lobe pneumonia.  -Further discussions were held with patient's wife and son by critical care team who reported that he would not want mechanical ventilation, decision was made for comfort care and terminal extubation was performed on 2/6 palliative care was also consulted.He was Transferred from [facility] to [facility] service 2/8. Patient was unresponsive on a Dilaudid dripand wass a high risk for decompensation with anticipated hospital death pending and imminent and  he passed away on 02/25/2019 at 18:02.  Pertinent Labs and Studies  Significant Diagnostic Studies Ct Abdomen Pelvis Wo Contrast  Result Date: 02-06-2019 CLINICAL DATA:  Generalized acute abdominal pain. Apnea requiring intubation. Low back and left hip pain. EXAM: CT ABDOMEN AND PELVIS WITHOUT CONTRAST TECHNIQUE: Multidetector CT imaging of the abdomen and pelvis was performed following the standard protocol without IV contrast. COMPARISON:  04/16/2015 FINDINGS: Lower chest: Reticulonodular airspace disease in the left more than right bases. Enteric tube has been placed with tip in the proximal stomach. Hepatobiliary: No focal liver abnormality.Cholelithiasis. Pancreas: Scattered coarse calcifications.  No acute finding. Spleen: Unremarkable. Adrenals/Urinary Tract: Negative adrenals. Mild bilateral hydronephrosis and hydroureter above a distended urinary bladder. Negative for ureteral calculus or other visible obstructive process. Punctate left and 3 mm right renal calculi. Stomach/Bowel: No obstruction. No bowel wall thickening. Focal hazy fat along the sigmoid colon without underlying wall thickening, likely sequela of epiploic appendagitis, history suggesting that this is chronic. Appendectomy. Vascular/Lymphatic: Atherosclerotic calcification. No mass or adenopathy. Reproductive:Negative Other: No ascites or pneumoperitoneum. Musculoskeletal: Prominent osteopenia in the setting of known multiple myeloma. Remote appearing T11 and T12 compression fractures with T12 cement augmentation. Remote lateral left ninth rib fracture. IMPRESSION: 1. Aspiration or pneumonia in the left more than right lung base. 2. Bilateral hydronephrosis and hydroureter above an over distended bladder. 3. Nephrolithiasis and cholelithiasis. 4. Sigmoid epiploic appendagitis of indeterminate chronicity. Electronically Signed   By: Monte Fantasia M.D.   On: Feb 06, 2019 05:03   Ct Head Wo Contrast  Result Date: 2019-02-06 CLINICAL  DATA:  Posttraumatic headache EXAM: CT HEAD WITHOUT CONTRAST CT CERVICAL SPINE WITHOUT CONTRAST TECHNIQUE: Multidetector  CT imaging of the head and cervical spine was performed following the standard protocol without intravenous contrast. Multiplanar CT image reconstructions of the cervical spine were also generated. COMPARISON:  None. FINDINGS: CT HEAD FINDINGS Brain: There is no mass, hemorrhage or extra-axial collection. The size and configuration of the ventricles and extra-axial CSF spaces are normal. There is hypoattenuation of the periventricular white matter, most commonly indicating chronic ischemic microangiopathy. Vascular: No abnormal hyperdensity of the major intracranial arteries or dural venous sinuses. No intracranial atherosclerosis. Skull: Numerous calvarial lucencies. Sinuses/Orbits: Moderate diffuse paranasal sinus mucosal thickening. No mastoid or middle ear effusion. The orbits are normal. CT CERVICAL SPINE FINDINGS Alignment: No static subluxation. Facets are aligned. Occipital condyles are normally positioned. Skull base and vertebrae: Numerous bone marrow lucencies throughout the spine. No acute fracture. Soft tissues and spinal canal: No prevertebral fluid or swelling. No visible canal hematoma. Disc levels: No advanced spinal canal or neural foraminal stenosis. Upper chest: No pneumothorax, pulmonary nodule or pleural effusion. Other: Normal visualized paraspinal cervical soft tissues. IMPRESSION: 1. No acute intracranial abnormality. 2. Diffuse bone marrow disease consistent with multiple myeloma. Electronically Signed   By: Ulyses Jarred M.D.   On: 01/15/2019 05:07   Ct Cervical Spine Wo Contrast  Result Date: 01/22/2019 CLINICAL DATA:  Posttraumatic headache EXAM: CT HEAD WITHOUT CONTRAST CT CERVICAL SPINE WITHOUT CONTRAST TECHNIQUE: Multidetector CT imaging of the head and cervical spine was performed following the standard protocol without intravenous contrast. Multiplanar CT  image reconstructions of the cervical spine were also generated. COMPARISON:  None. FINDINGS: CT HEAD FINDINGS Brain: There is no mass, hemorrhage or extra-axial collection. The size and configuration of the ventricles and extra-axial CSF spaces are normal. There is hypoattenuation of the periventricular white matter, most commonly indicating chronic ischemic microangiopathy. Vascular: No abnormal hyperdensity of the major intracranial arteries or dural venous sinuses. No intracranial atherosclerosis. Skull: Numerous calvarial lucencies. Sinuses/Orbits: Moderate diffuse paranasal sinus mucosal thickening. No mastoid or middle ear effusion. The orbits are normal. CT CERVICAL SPINE FINDINGS Alignment: No static subluxation. Facets are aligned. Occipital condyles are normally positioned. Skull base and vertebrae: Numerous bone marrow lucencies throughout the spine. No acute fracture. Soft tissues and spinal canal: No prevertebral fluid or swelling. No visible canal hematoma. Disc levels: No advanced spinal canal or neural foraminal stenosis. Upper chest: No pneumothorax, pulmonary nodule or pleural effusion. Other: Normal visualized paraspinal cervical soft tissues. IMPRESSION: 1. No acute intracranial abnormality. 2. Diffuse bone marrow disease consistent with multiple myeloma. Electronically Signed   By: Ulyses Jarred M.D.   On: 01/13/2019 05:07   US Renal  Result Date: 01/01/2019 CLINICAL DATA:  Septic shock and hypotension. History of multiple myeloma. EXAM: RENAL / URINARY TRACT ULTRASOUND COMPLETE COMPARISON:  CT scan 01/13/2019 FINDINGS: Right Kidney: Renal measurements: 11.3 x 4.5 x 5.0 cm = volume: 131.85 mL. Normal renal cortical thickness and echogenicity. Slightly prominent extra renal pelvis but no hydronephrosis. Lower pole right renal calculus is noted. No perinephric fluid collections. Left Kidney: Renal measurements: 11.2 x 5.8 x 4.7 cm = volume: 165.5 mL. Normal renal cortical thickness and  echogenicity. No hydronephrosis. No worrisome renal lesions. No perinephric fluid collections. Bladder: Normal.  Foley catheter noted. IMPRESSION: Unremarkable renal ultrasound examination. Electronically Signed   By: Marijo Sanes M.D.   On: 01/21/2019 13:40   Dg Chest Port 1 View  Result Date: 02/01/2019 CLINICAL DATA:  Respiratory failure requiring intubation EXAM: PORTABLE CHEST 1 VIEW COMPARISON:  Yesterday FINDINGS: Endotracheal tube tip  just below the clavicular heads. The feeding tube at least reaches the distal stomach. Low volume chest with asymmetric hazy density at the right base. No Kerley lines, effusion, or pneumothorax. Normal heart size. Osteopenia. History of multiple myeloma. IMPRESSION: 1. Stable hardware positioning. 2. History of pneumonia with stable asymmetric density at the right base. Electronically Signed   By: Monte Fantasia M.D.   On: 02/01/2019 08:38   Dg Chest Port 1 View  Result Date: 01/31/2019 CLINICAL DATA:  Respiratory failure EXAM: PORTABLE CHEST 1 VIEW COMPARISON:  01/30/2019 FINDINGS: Endotracheal tube is 3 cm above the carina. Soft feeding tube is in the region of the antrum or pylorus. Mild volume loss/infiltrate at the right base appears similar. The remainder of the lungs are clear. No change or progressive finding. IMPRESSION: No change. Persistent infiltrate/atelectasis at the right lung base. Electronically Signed   By: Nelson Chimes M.D.   On: 01/31/2019 06:51   Dg Chest Port 1 View  Result Date: 01/30/2019 CLINICAL DATA:  ET tube reposition. EXAM: PORTABLE CHEST 1 VIEW COMPARISON:  01/27/2019. FINDINGS: Unchanged cardiomediastinal silhouette, likely within normal limits. Tortuous aorta. Asymmetric opacity at the RIGHT base appears similar to priors. Small RIGHT effusion. ETT 2.3 cm above carina. No pneumothorax. IMPRESSION: Stable aeration. Asymmetric opacity RIGHT base. ETT 2.3 cm above carina. Electronically Signed   By: Staci Righter M.D.   On: 01/30/2019  12:44   Dg Chest Port 1 View  Result Date: 01/27/2019 CLINICAL DATA:  Respiratory failure. EXAM: PORTABLE CHEST 1 VIEW COMPARISON:  01/24/2019 and 01/01/2019 FINDINGS: The endotracheal tube is in good position at the level of the thoracic inlet. Feeding tube tip is below the diaphragm. Heart size and vascularity are normal. There is slight atelectasis at the lung bases, increased on the right. No consolidative infiltrates or definitive effusions. No acute bone abnormality. IMPRESSION: Minimal bibasilar atelectasis, slightly increased on the right. Electronically Signed   By: Lorriane Shire M.D.   On: 01/27/2019 08:14   Dg Chest Port 1 View  Result Date: 01/24/2019 CLINICAL DATA:  Ventilator support. EXAM: PORTABLE CHEST 1 VIEW COMPARISON:  01/09/2019 FINDINGS: Nasogastric tube has been removed. Endotracheal tube tip is 5 cm above the carina. Mild patchy atelectasis or infiltrate in the left base persists. Chest is otherwise clear. IMPRESSION: Endotracheal tube tip 5 cm above the carina. Nasogastric tube removed. Persistent mild atelectasis or infiltrate in the left base. Electronically Signed   By: Nelson Chimes M.D.   On: 01/24/2019 07:35   Dg Chest Port 1 View  Result Date: 01/17/2019 CLINICAL DATA:  Chest pain EXAM: PORTABLE CHEST 1 VIEW COMPARISON:  10/22/2017 FINDINGS: Endotracheal tube tip just below the clavicular heads. The orogastric tube reaches the stomach with side port the lower esophagus. Interstitial coarsening with subtle reticulonodular density at the left base. No edema, effusion, or pneumothorax. Normal heart size. Prominent osteopenia.  There is history of multiple myeloma. IMPRESSION: 1. Endotracheal tube in good position. 2. Orogastric tube side port at the lower esophagus, consider advancement by 6-7 cm. 3. Questionable infiltrate in the left base which will likely be visualized on pending abdominal CT. Electronically Signed   By: Monte Fantasia M.D.   On: 01/18/2019 04:24   Dg  Abd Portable 1v  Result Date: 01/25/2019 CLINICAL DATA:  Feeding tube placement. EXAM: PORTABLE ABDOMEN - 1 VIEW COMPARISON:  One-view abdomen 12/31/2018 FINDINGS: The tip of a small bore feeding tube is in the distal stomach, potentially the duodenal bulb. Right femoral  line is stable. Dilated loops of small bowel in the left upper quadrant are again noted. Gallstones are present. IMPRESSION: 1. The tip of a small bore feeding tube is in the distal stomach, potentially the duodenum bulb. 2. Persistent dilated loops of small bowel in the left upper quadrant. Electronically Signed   By: San Morelle M.D.   On: 01/25/2019 11:03   Dg Abd Portable 1v  Result Date: 01/06/2019 CLINICAL DATA:  OG tube placement. EXAM: PORTABLE ABDOMEN - 1 VIEW COMPARISON:  CT of the abdomen and pelvis 01/05/2019 FINDINGS: The side port of the OG tube is in the fundus of the stomach. Bowel gas pattern is normal. Right femoral line is in place. Gallstones are noted. Lung bases are clear. Defibrillator pad is in place. IMPRESSION: 1. OG tube terminates in the stomach, in satisfactory position. 2. Cholelithiasis. 3. Right femoral line in satisfactory position. Electronically Signed   By: San Morelle M.D.   On: 01/20/2019 09:33    Microbiology No results found for this or any previous visit (from the past 240 hour(s)).  Lab Basic Metabolic Panel: No results for input(s): NA, K, CL, CO2, GLUCOSE, BUN, CREATININE, CALCIUM, MG, PHOS in the last 168 hours. Liver Function Tests: No results for input(s): AST, ALT, ALKPHOS, BILITOT, PROT, ALBUMIN in the last 168 hours. No results for input(s): LIPASE, AMYLASE in the last 168 hours. No results for input(s): AMMONIA in the last 168 hours. CBC: No results for input(s): WBC, NEUTROABS, HGB, HCT, MCV, PLT in the last 168 hours. Cardiac Enzymes: No results for input(s): CKTOTAL, CKMB, CKMBINDEX, TROPONINI in the last 168 hours. Sepsis Labs: No results for input(s):  PROCALCITON, WBC, LATICACIDVEN in the last 168 hours.  Procedures/Operations   Procedures:  1/27 ETT >> 2/5 1/27 R femoral TL CVL >>  1/27 Foley >>  Significant Diagnostic Tests:  12/29/2018 CTH/ cervical  >> 1. No acute intracranial abnormality. 2. Diffuse bone marrow disease consistent with multiple myeloma.  01/20/2019 CT abd/ pelvis >> 1. Aspiration or pneumonia in the left more than right lung base. 2. Bilateral hydronephrosis and hydroureter above an over distended bladder. 3. Nephrolithiasis and cholelithiasis. 4. Sigmoid epiploic appendagitis of indeterminate chronicity.  2/3 CXR> persistent RLL atelectasis vs infiltrate   Micro Data:  1/27 BCx 2 >> no growth in 24-hour 1/27 urine cx >> no growth 1/27 Resp cx  >> gram-positive rods on prelim. 1/27 MRSA PCR >> negative  Antimicrobials:  1/27 vanc x 1 1/27 flagyl x 1 1/27 aztreonam x 1 1/27>>Azithro >1/31 1/27 ceftriaxone >>1/31  Kerney Elbe, DO 02-16-19, 7:15 PM

## 2019-02-26 NOTE — Progress Notes (Signed)
Wasted 22.52ml of remaining dilaudid after patient passed in stericycle in med room b, witnessed by Wonda Cerise, RN.

## 2019-02-26 DEATH — deceased

## 2019-03-08 ENCOUNTER — Other Ambulatory Visit: Payer: Medicare Other

## 2019-03-15 ENCOUNTER — Other Ambulatory Visit: Payer: Medicare Other

## 2019-03-15 ENCOUNTER — Ambulatory Visit: Payer: Medicare Other | Admitting: Hematology

## 2020-02-25 IMAGING — DX DG ABD PORTABLE 1V
1 series · 1 of 1 positions shown · non-contrast
Comparison: CT of the abdomen and pelvis 01/23/2019

CLINICAL DATA: OG tube placement.

EXAM:
PORTABLE ABDOMEN - 1 VIEW

[abdomen kub]
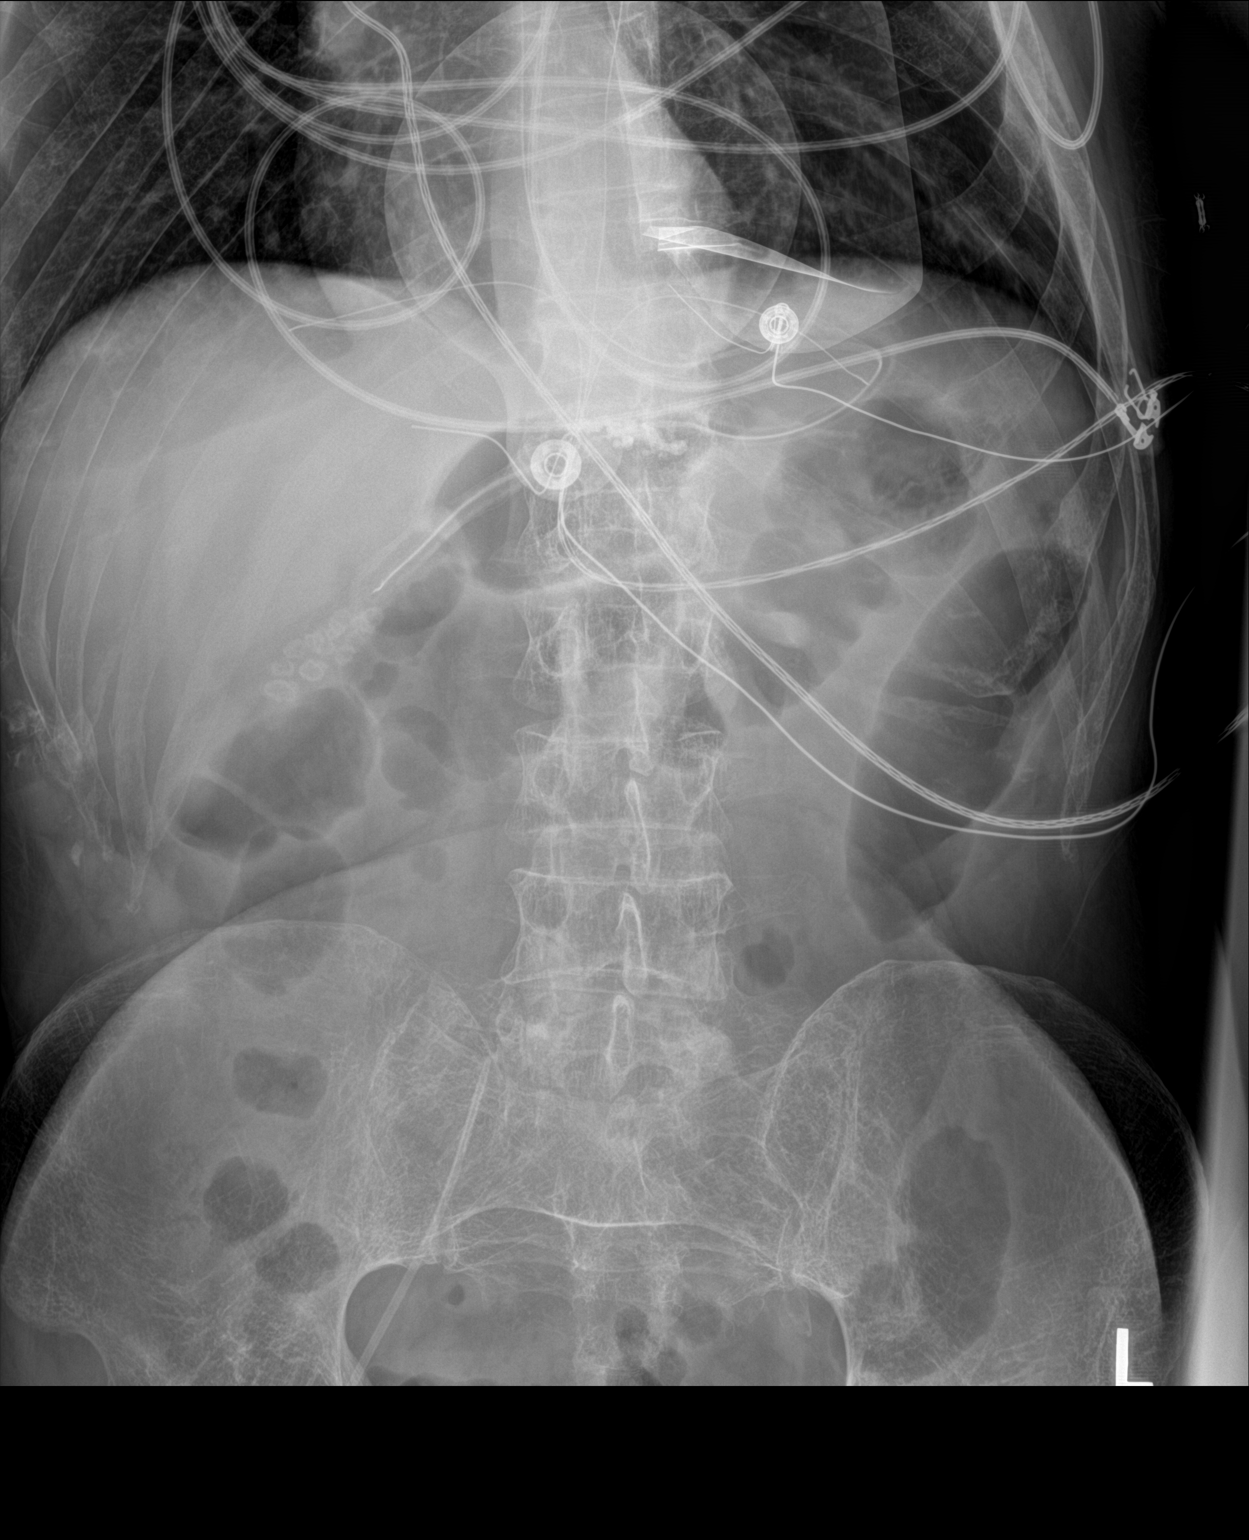

[1 of 1 positions shown; findings below may reference images not displayed]

FINDINGS: The side port of the OG tube is in the fundus of the stomach. Bowel
gas pattern is normal. Right femoral line is in place. Gallstones
are noted. Lung bases are clear. Defibrillator pad is in place.
IMPRESSION: 1. OG tube terminates in the stomach, in satisfactory position.
2. Cholelithiasis.
3. Right femoral line in satisfactory position.

## 2020-02-25 IMAGING — CT CT ABD-PELV W/O
2 of 4 series · 16 of 46 positions shown, 18 images · non-contrast
Comparison: 04/16/2015

CLINICAL DATA: Generalized acute abdominal pain. Apnea requiring
intubation. Low back and left hip pain.

EXAM:
CT ABDOMEN AND PELVIS WITHOUT CONTRAST
TECHNIQUE: Multidetector CT imaging of the abdomen and pelvis was performed
following the standard protocol without IV contrast.

[Series 3: a/p w/o 5mm · axial · non-contrast · 0.84mm/px · z∈[-868,-453]mm · 13 of 91 slices shown, 15 images]
[im 4/91  soft-tissue]
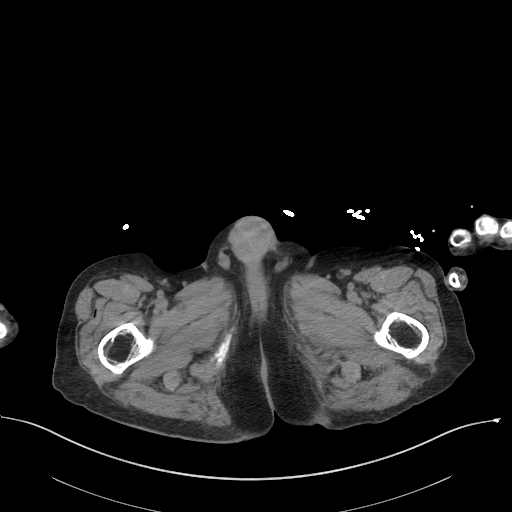
[im 4/91  bone]
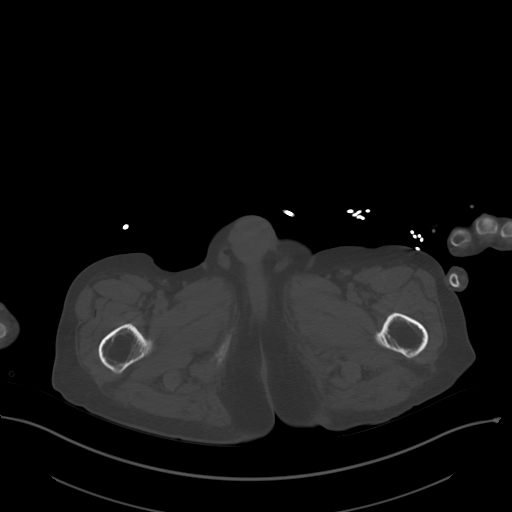
[im 11/91  soft-tissue]
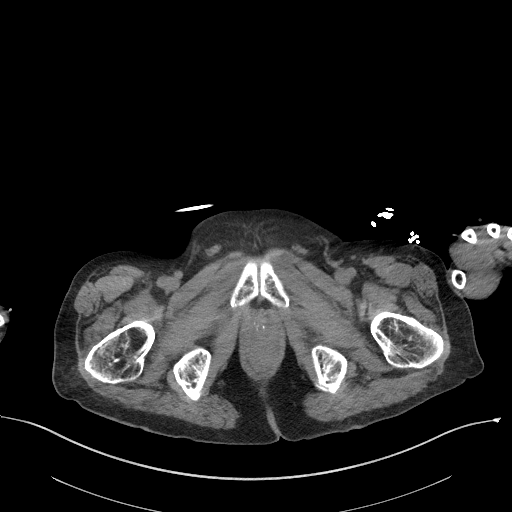
[im 18/91  soft-tissue]
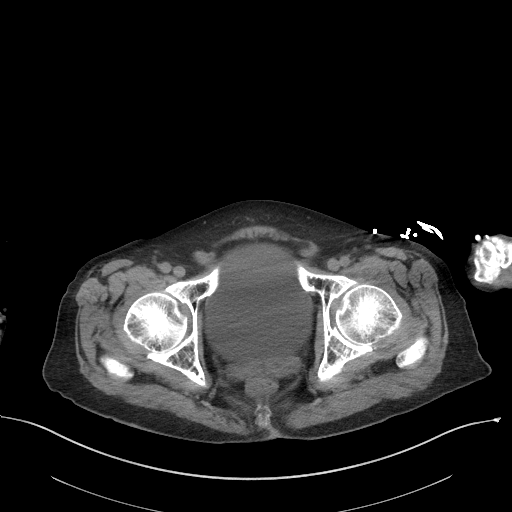
[im 25/91  soft-tissue]
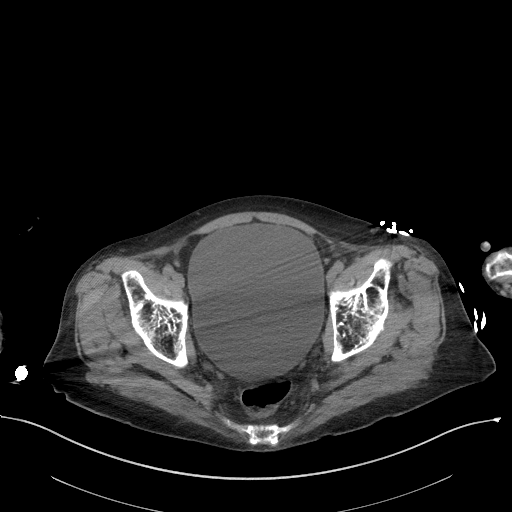
[im 32/91  soft-tissue]
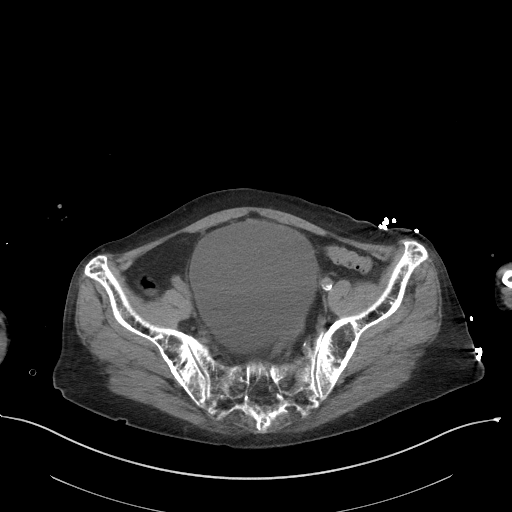
[im 39/91  soft-tissue]
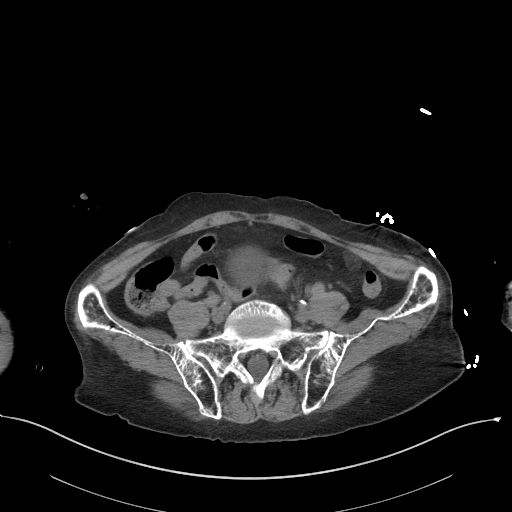
[im 46/91  soft-tissue]
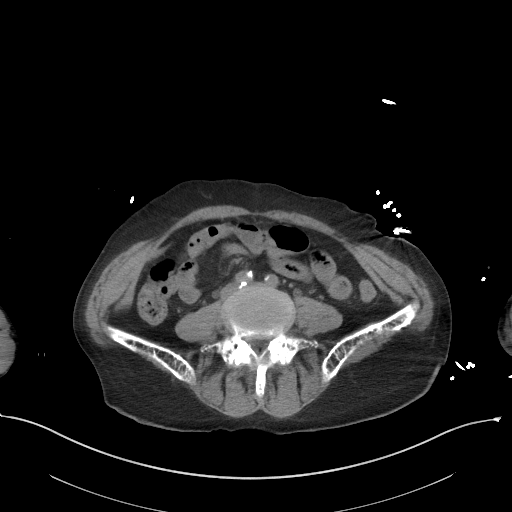
[im 52/91  soft-tissue]
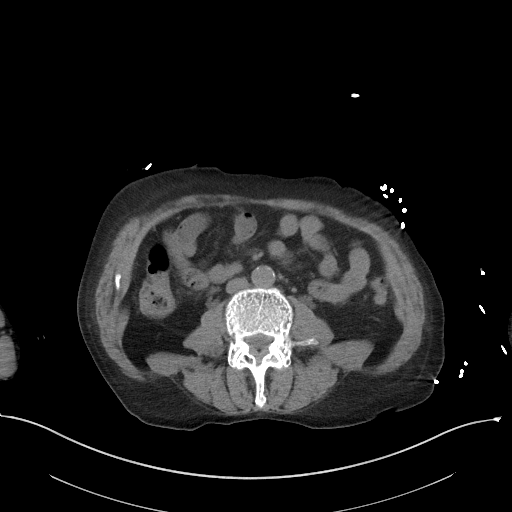
[im 59/91  soft-tissue]
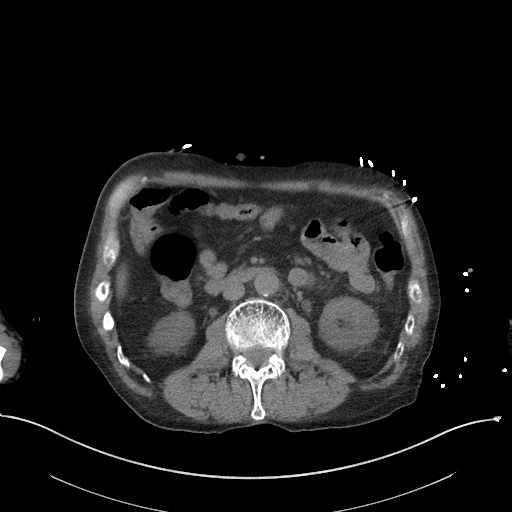
[im 59/91  bone]
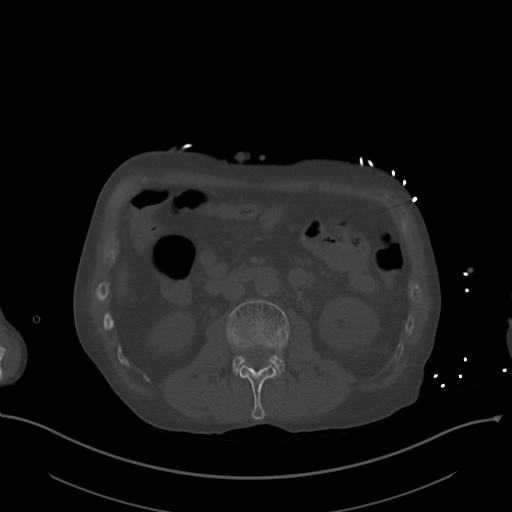
[im 66/91  soft-tissue]
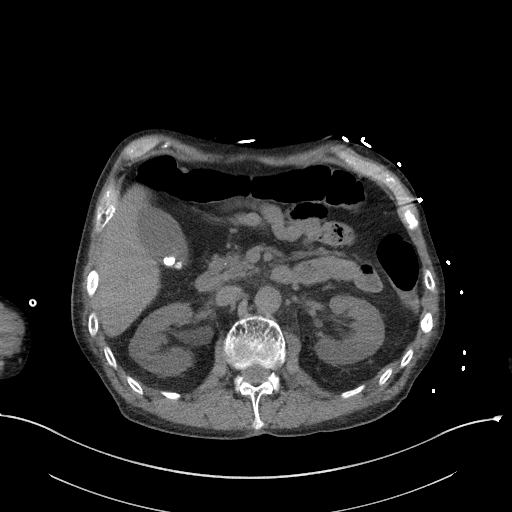
[im 73/91  soft-tissue]
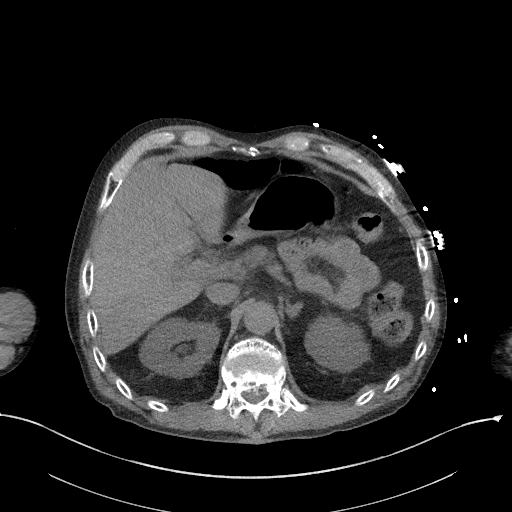
[im 80/91  soft-tissue]
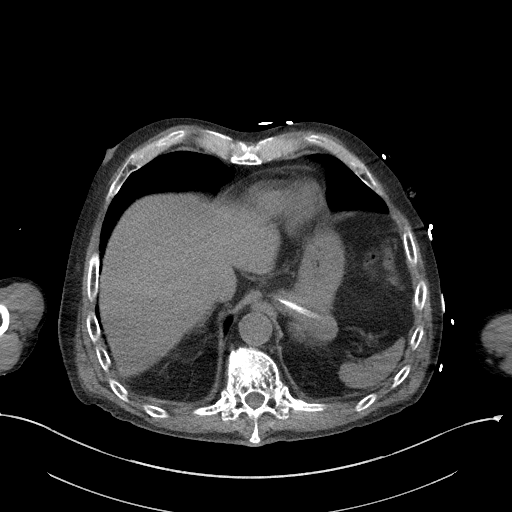
[im 87/91  soft-tissue]
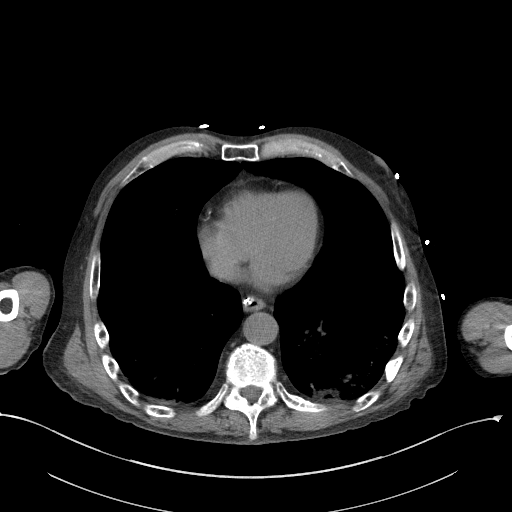

[Series 6: a/p w/o cor · coronal · non-contrast · 0.72mm/px · 3 of 140 slices shown]
[im 47/140  soft-tissue]
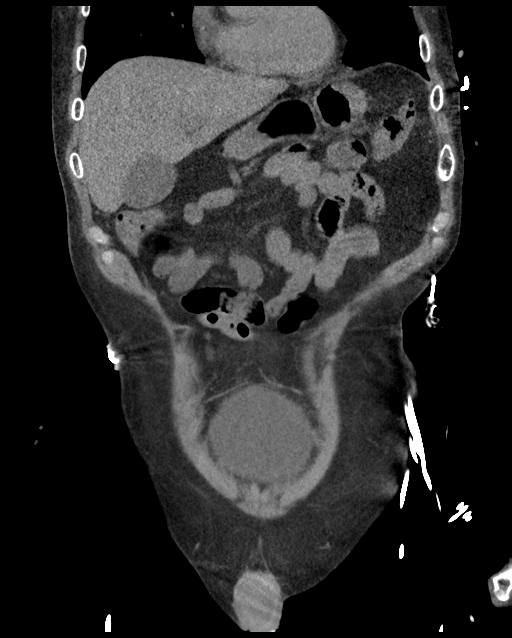
[im 62/140  soft-tissue]
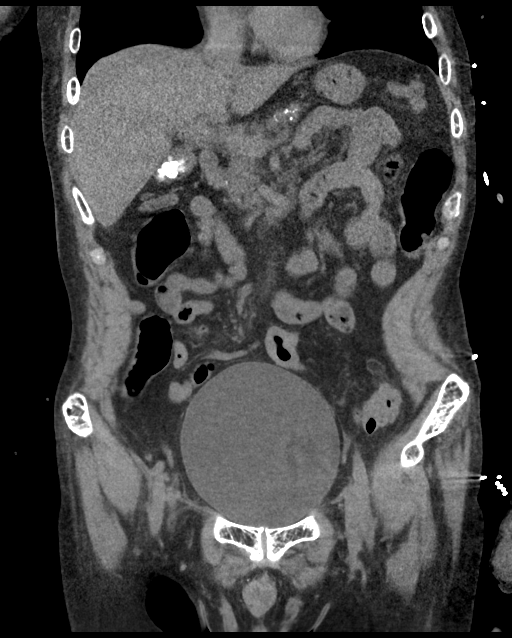
[im 78/140  soft-tissue]
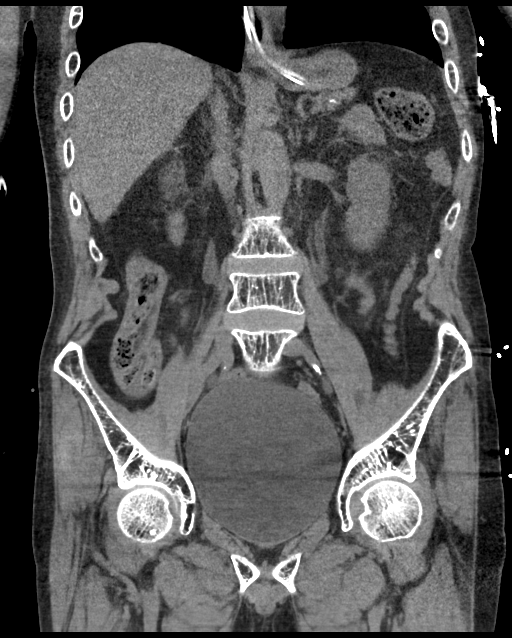

[16 of 46 positions shown; findings below may reference images not displayed]

FINDINGS: Lower chest: Reticulonodular airspace disease in the left more than
right bases. Enteric tube has been placed with tip in the proximal
stomach.

Hepatobiliary: No focal liver abnormality.Cholelithiasis.

Pancreas: Scattered coarse calcifications.  No acute finding.

Spleen: Unremarkable.

Adrenals/Urinary Tract: Negative adrenals. Mild bilateral
hydronephrosis and hydroureter above a distended urinary bladder.
Negative for ureteral calculus or other visible obstructive process.
Punctate left and 3 mm right renal calculi.

Stomach/Bowel: No obstruction. No bowel wall thickening. Focal hazy
fat along the sigmoid colon without underlying wall thickening,
likely sequela of epiploic appendagitis, history suggesting that
this is chronic. Appendectomy.

Vascular/Lymphatic: Atherosclerotic calcification. No mass or
adenopathy.

Reproductive:Negative

Other: No ascites or pneumoperitoneum.

Musculoskeletal: Prominent osteopenia in the setting of known
multiple myeloma. Remote appearing T11 and T12 compression fractures
with T12 cement augmentation. Remote lateral left ninth rib
fracture.
IMPRESSION: 1. Aspiration or pneumonia in the left more than right lung base.
2. Bilateral hydronephrosis and hydroureter above an over distended
bladder.
3. Nephrolithiasis and cholelithiasis.
4. Sigmoid epiploic appendagitis of indeterminate chronicity.
